# Patient Record
Sex: Female | Born: 1950 | Race: White | Hispanic: No | Marital: Married | Smoking: Current every day smoker
Health system: Southern US, Community
[De-identification: ages and names within clinical notes are randomized; demographics above are authoritative.]

## PROBLEM LIST (undated history)

## (undated) DIAGNOSIS — J410 Simple chronic bronchitis: Secondary | ICD-10-CM

## (undated) DIAGNOSIS — F419 Anxiety disorder, unspecified: Secondary | ICD-10-CM

## (undated) DIAGNOSIS — M1990 Unspecified osteoarthritis, unspecified site: Secondary | ICD-10-CM

## (undated) DIAGNOSIS — Z9641 Presence of insulin pump (external) (internal): Secondary | ICD-10-CM

## (undated) DIAGNOSIS — G2581 Restless legs syndrome: Secondary | ICD-10-CM

## (undated) DIAGNOSIS — J45909 Unspecified asthma, uncomplicated: Secondary | ICD-10-CM

## (undated) DIAGNOSIS — M810 Age-related osteoporosis without current pathological fracture: Secondary | ICD-10-CM

## (undated) DIAGNOSIS — Z972 Presence of dental prosthetic device (complete) (partial): Secondary | ICD-10-CM

## (undated) DIAGNOSIS — E119 Type 2 diabetes mellitus without complications: Secondary | ICD-10-CM

## (undated) HISTORY — DX: Anxiety disorder, unspecified: F41.9

## (undated) HISTORY — DX: Age-related osteoporosis without current pathological fracture: M81.0

## (undated) HISTORY — PX: THYROID SURGERY: SHX805

## (undated) HISTORY — PX: ROTATOR CUFF REPAIR: SHX139

## (undated) HISTORY — DX: Type 2 diabetes mellitus without complications: E11.9

---

## 1982-01-12 HISTORY — PX: SPINE SURGERY: SHX786

## 2002-04-03 ENCOUNTER — Ambulatory Visit (HOSPITAL_COMMUNITY): Admission: RE | Admit: 2002-04-03 | Discharge: 2002-04-03 | Payer: Self-pay | Admitting: Neurology

## 2002-04-03 ENCOUNTER — Encounter: Payer: Self-pay | Admitting: Neurology

## 2005-01-12 HISTORY — PX: JOINT REPLACEMENT: SHX530

## 2005-09-07 ENCOUNTER — Other Ambulatory Visit: Payer: Self-pay

## 2005-09-07 ENCOUNTER — Inpatient Hospital Stay: Payer: Self-pay | Admitting: Specialist
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
## 2008-02-03 ENCOUNTER — Emergency Department: Payer: Self-pay | Admitting: Emergency Medicine

## 2008-02-03 IMAGING — CT CT HEAD WITHOUT CONTRAST
2 series · 15 of 30 positions shown, 19 images · non-contrast
Comparison: none

REASON FOR EXAM: headache  head injury
COMMENTS:

PROCEDURE:     CT  - CT HEAD WITHOUT CONTRAST  - [DATE]  [DATE]
RESULT:     Comparison: No comparison
TECHNIQUE: Multiple axial images from the foramen magnum to the vertex were
obtained without IV contrast.

[Series 2: without · axial · non-contrast · 0.42mm/px · z∈[-149,-29]mm · 13 of 30 slices shown, 17 images]
[im 3/30  brain]
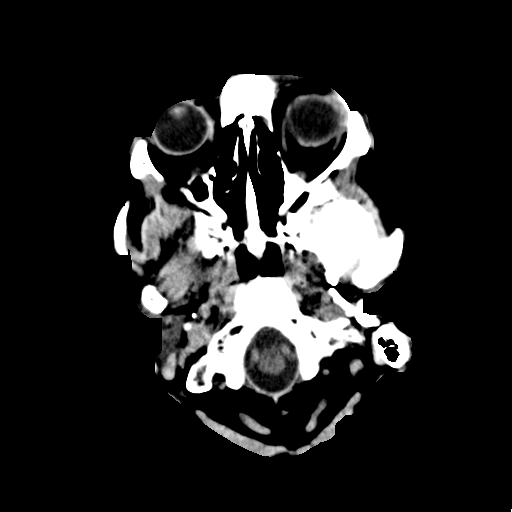
[im 3/30  bone]
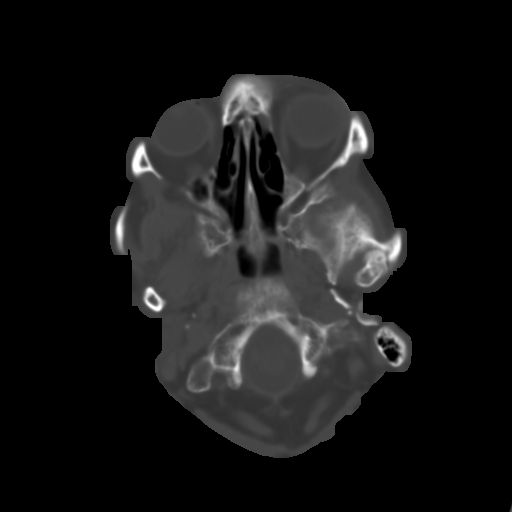
[im 5/30  brain]
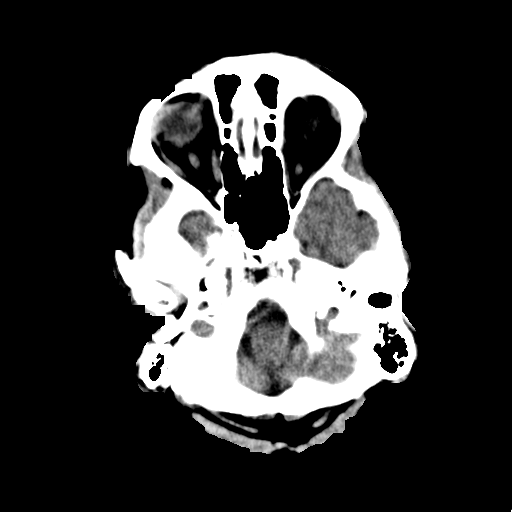
[im 7/30  brain]
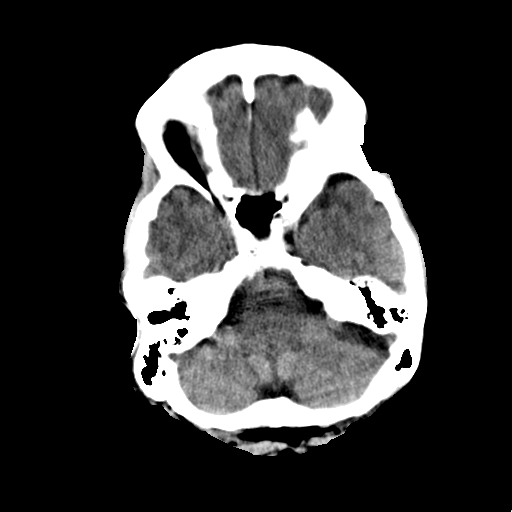
[im 9/30  brain]
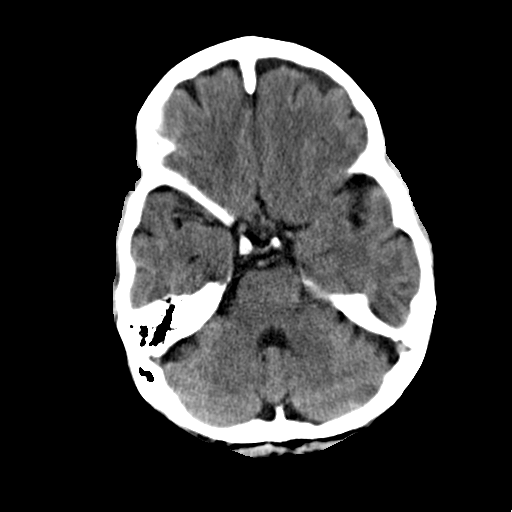
[im 11/30  brain]
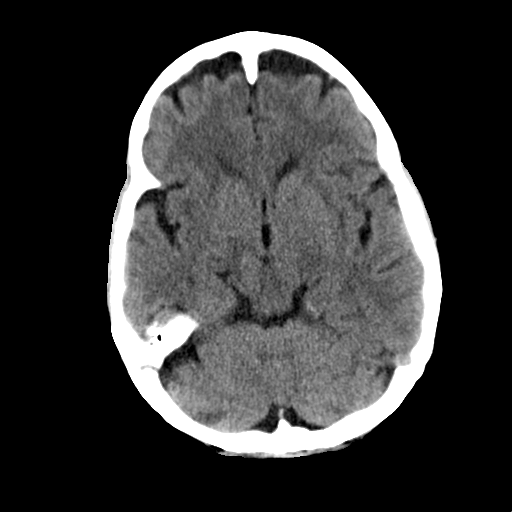
[im 11/30  bone]
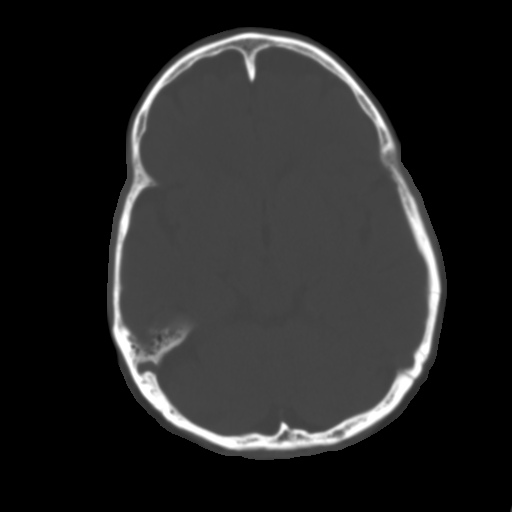
[im 13/30  brain]
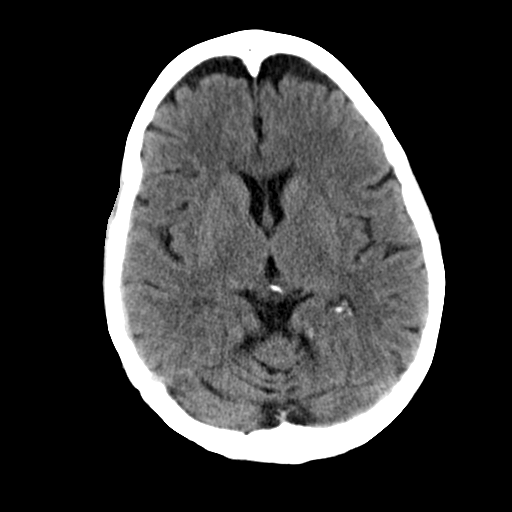
[im 15/30  brain]
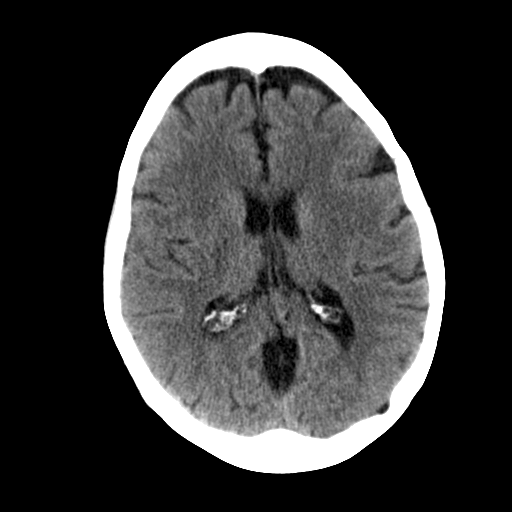
[im 17/30  brain]
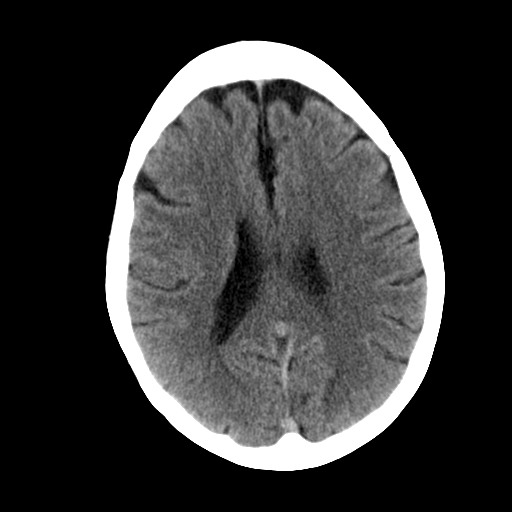
[im 19/30  brain]
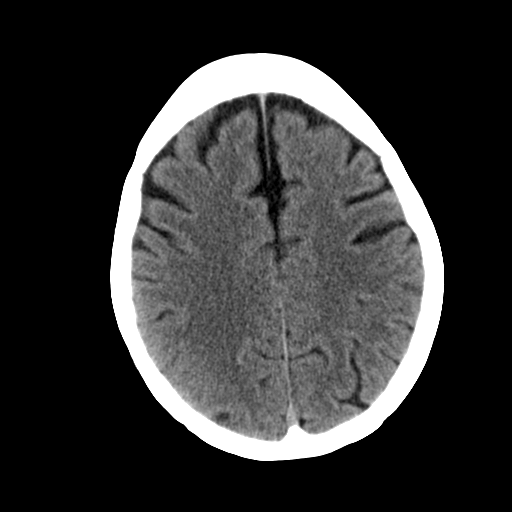
[im 19/30  bone]
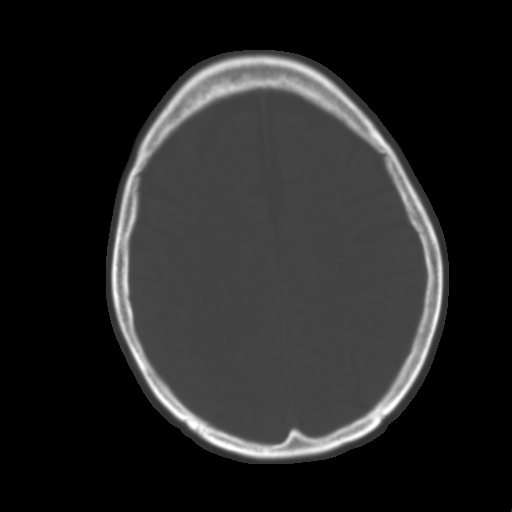
[im 21/30  brain]
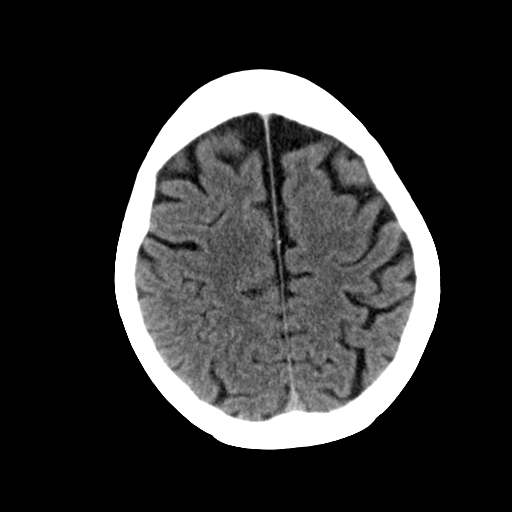
[im 23/30  brain]
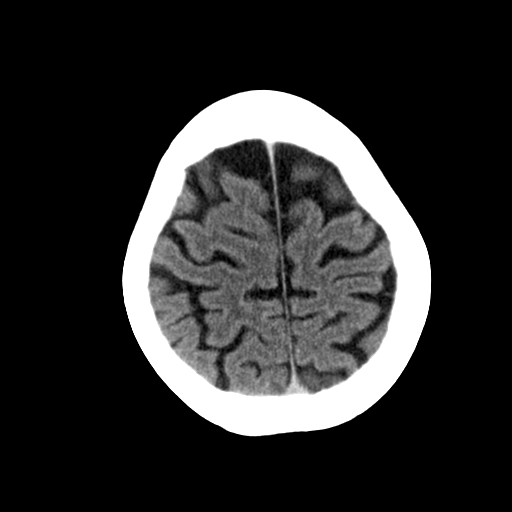
[im 25/30  brain]
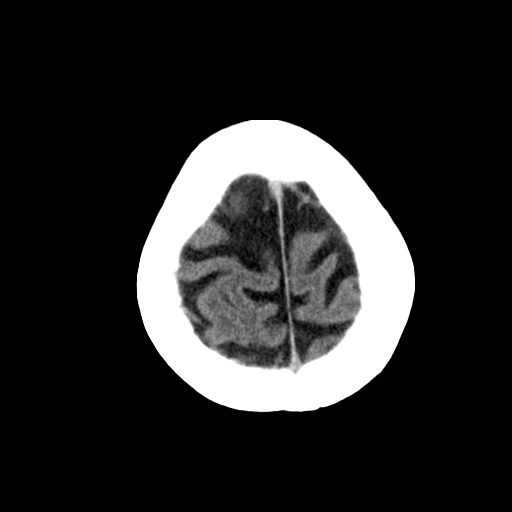
[im 27/30  brain]
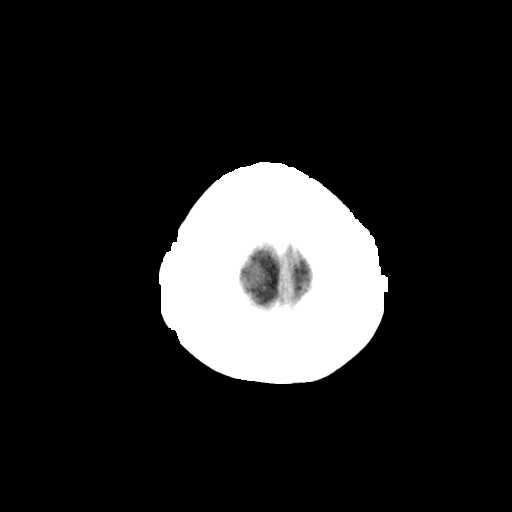
[im 27/30  bone]
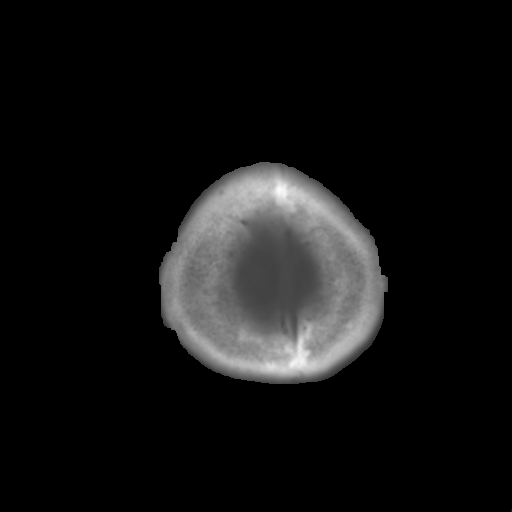

[Series 3: bone · axial · 0.42mm/px · z∈[-149,-129]mm · 2 of 30 slices shown]
[im 3/30  bone]
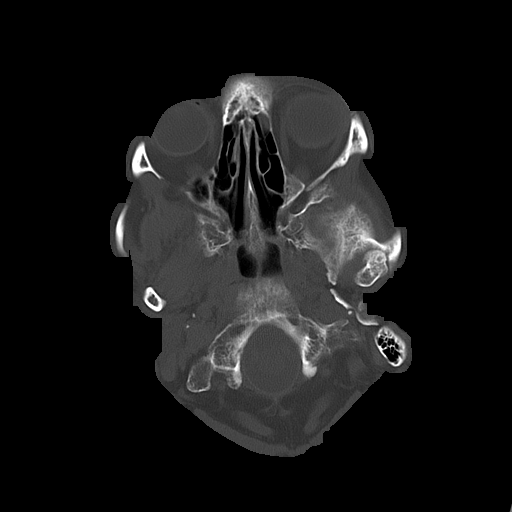
[im 7/30  bone]
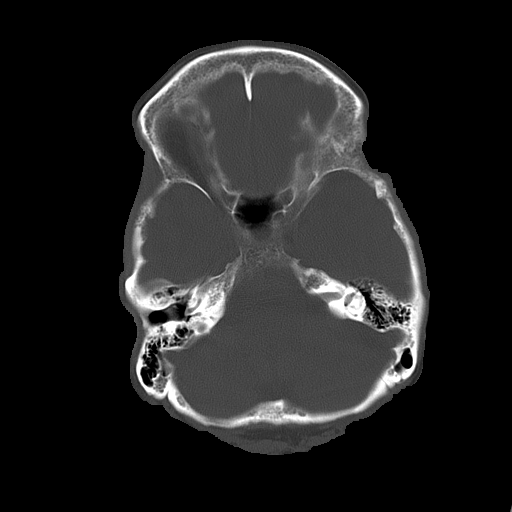

[15 of 30 positions shown; findings below may reference images not displayed]

FINDINGS: There is no evidence for mass effect, midline shift, or extra-axial fluid
collections.  There is generalized cerebral atrophy. There is mild
periventricular and subcortical white matter low attenuation which is
nonspecific and is most commonly seen with microangiopathy. There is no
evidence for space-occupying lesion or intracranial hemorrhage. There is no
evidence for a cortical-based area of acute infarction.

Ventricles and sulci are appropriate for the patient's age. The basal
cisterns are patent.

Visualized portions of the orbits are unremarkable. The paranasal sinuses
and mastoid air cells are unremarkable.

The osseous structures are unremarkable. There is left periorbital soft
tissue swelling.
IMPRESSION: No acute intracranial process.

Left periorbital soft tissue swelling.
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
## 2009-11-01 ENCOUNTER — Emergency Department: Payer: Self-pay | Admitting: Emergency Medicine

## 2010-02-21 ENCOUNTER — Inpatient Hospital Stay: Payer: Self-pay | Admitting: Internal Medicine

## 2010-02-21 IMAGING — CR DG CHEST 2V
1 series · 2 of 2 positions shown · non-contrast
Comparison: none

REASON FOR EXAM: Shortness of Breath
COMMENTS:   May transport without cardiac monitor

PROCEDURE:     DXR - DXR CHEST PA (OR AP) AND LATERAL  - [DATE]  [DATE]
RESULT:     Comparison: None

[Series 1: view not recorded · 0.17mm/px · 2 of 2 slices shown]
[im 1/2]
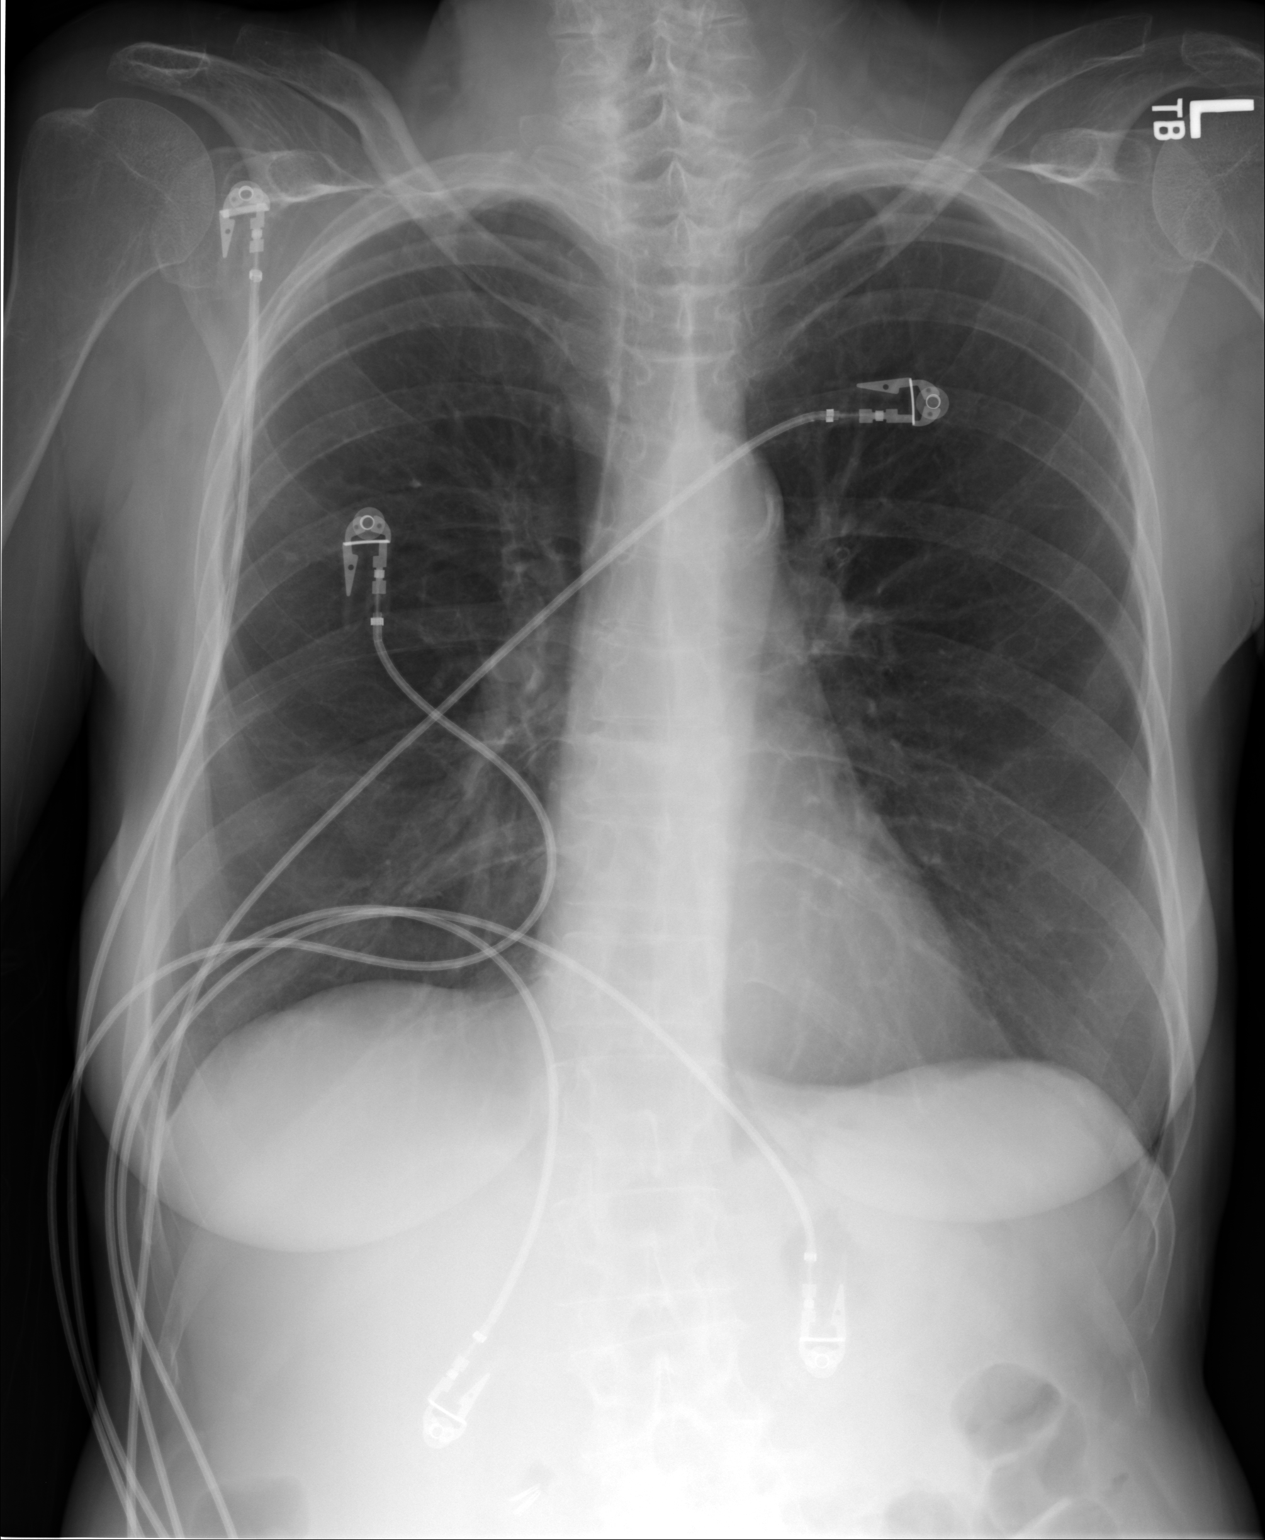
[im 2/2]
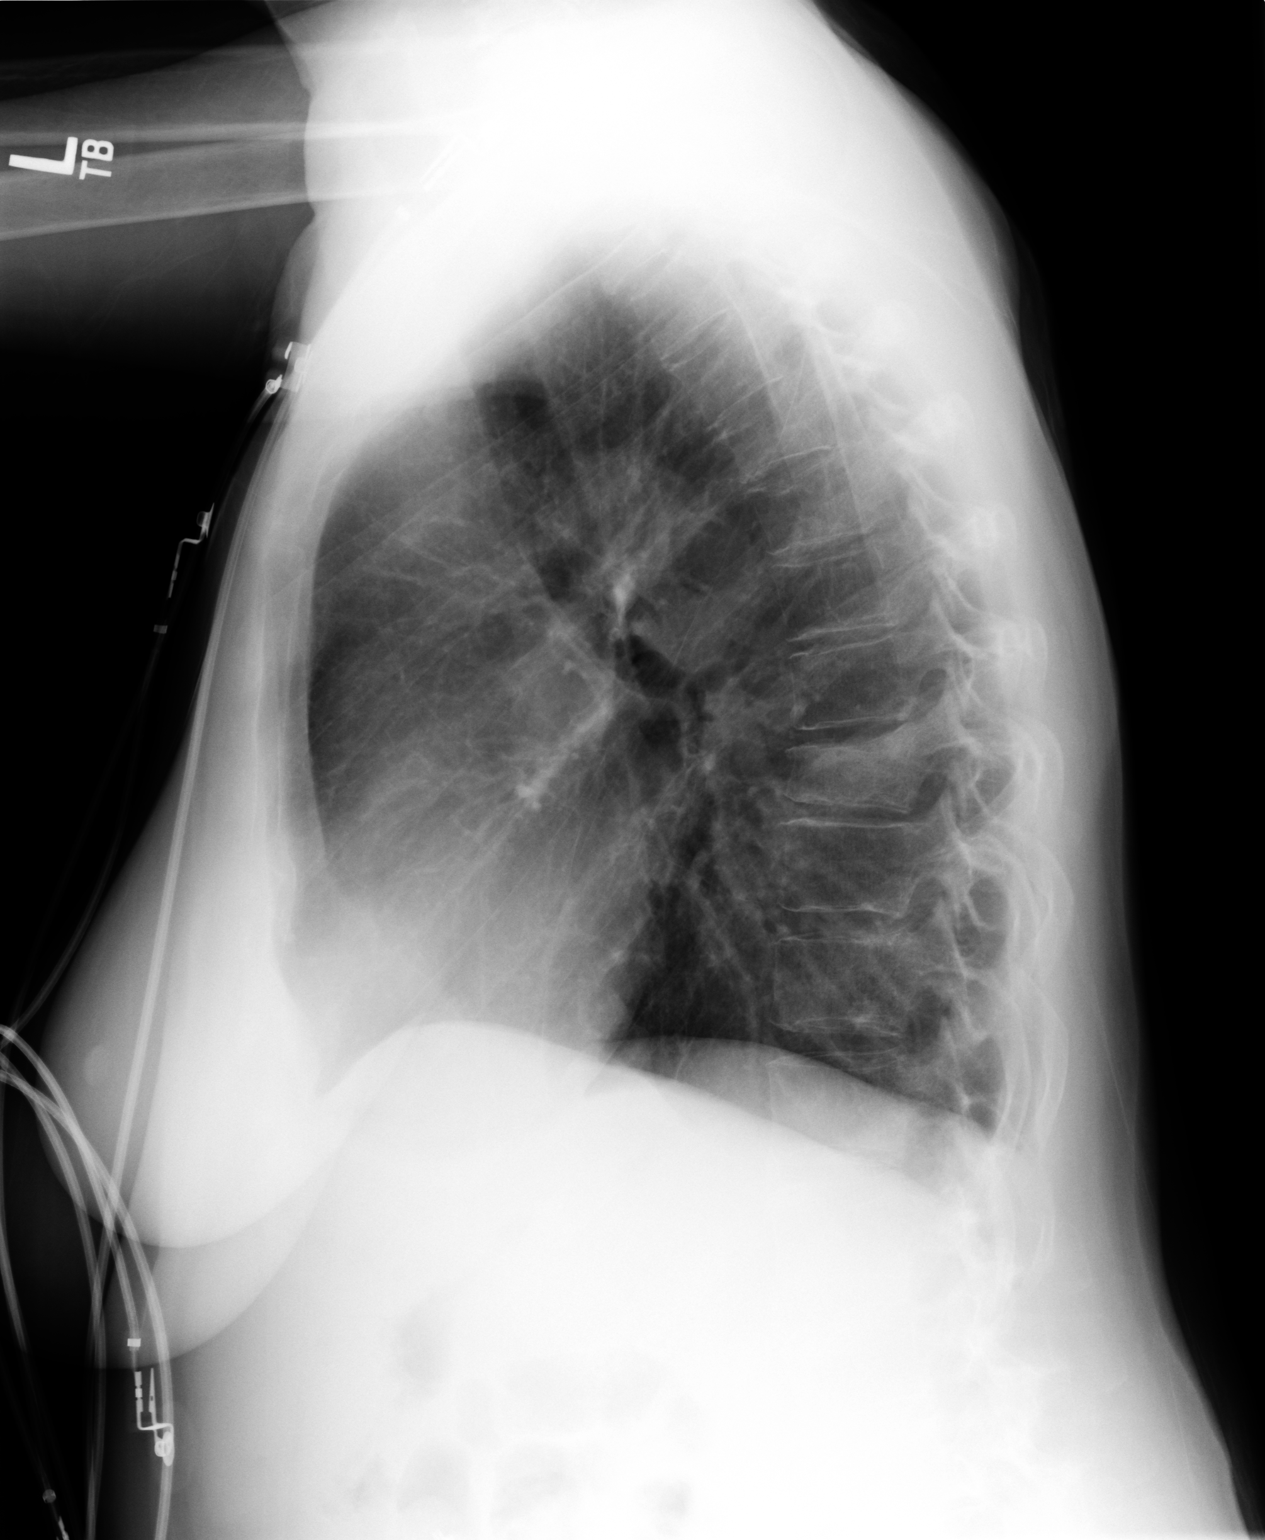

[2 of 2 positions shown; findings below may reference images not displayed]

FINDINGS: PA and lateral chest radiographs are provided. There is a 3 mm nodular
density projecting over the right upper midlung. There is no focal
parenchymal opacity, pleural effusion, or pneumothorax. The heart and
mediastinum are unremarkable.  The osseous structures are unremarkable.
IMPRESSION: No acute disease of the chest.

Small nodular density measuring approximately 3 mm projecting over the right
upper midlung. Recommend followup chest x-ray in 3 months to document
stability.
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
## 2010-11-25 ENCOUNTER — Inpatient Hospital Stay: Payer: Self-pay | Admitting: Psychiatry

## 2012-12-15 ENCOUNTER — Encounter: Payer: Self-pay | Admitting: Orthopedic Surgery

## 2013-01-12 ENCOUNTER — Encounter: Payer: Self-pay | Admitting: Orthopedic Surgery

## 2013-02-12 ENCOUNTER — Encounter: Payer: Self-pay | Admitting: Orthopedic Surgery

## 2013-09-21 ENCOUNTER — Encounter: Payer: Self-pay | Admitting: Orthopedic Surgery

## 2013-10-12 ENCOUNTER — Encounter: Payer: Self-pay | Admitting: Orthopedic Surgery

## 2013-11-12 ENCOUNTER — Encounter: Payer: Self-pay | Admitting: Orthopedic Surgery

## 2013-12-12 ENCOUNTER — Encounter: Payer: Self-pay | Admitting: Orthopedic Surgery

## 2014-06-12 ENCOUNTER — Encounter: Payer: Self-pay | Admitting: Physical Therapy

## 2014-06-12 ENCOUNTER — Ambulatory Visit: Attending: Orthopedic Surgery | Admitting: Physical Therapy

## 2014-06-12 DIAGNOSIS — M6281 Muscle weakness (generalized): Secondary | ICD-10-CM

## 2014-06-12 DIAGNOSIS — G8929 Other chronic pain: Secondary | ICD-10-CM

## 2014-06-12 DIAGNOSIS — M25512 Pain in left shoulder: Secondary | ICD-10-CM | POA: Insufficient documentation

## 2014-06-12 NOTE — Patient Instructions (Signed)
Home exercises: Scapular adduction, isometric IR/ER and extension in doorway

## 2014-06-12 NOTE — Therapy (Signed)
[facility] [address] [place], [place], [address] Phone: [phone]   Fax:  [phone]  Physical Therapy Evaluation  Patient Details  Name: Suzanne Snyder MRN: [id number] Date of Birth: 04-Jul-1950 Referring Provider:  Carin PrimroseKamath, Ganesh, MD  Encounter Date: 06/12/2014      PT End of Session - 06/12/14 1200    Visit Number 1   Number of Visits 24   Date for PT Re-Evaluation 09/04/14   PT Start Time 1113   PT Stop Time 1200   PT Time Calculation (min) 47 min   Activity Tolerance Patient tolerated treatment well   Behavior During Therapy [facility] for tasks assessed/performed      Past Medical History  Diagnosis Date  . Anxiety   . Diabetes mellitus without complication   . Osteoporosis     Past Surgical History  Procedure Laterality Date  . 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 Spine surgery N/A 1984    L4-5, not sure of procedure  . Rotator cuff repair Left 2014 and 2015    There were no vitals filed for this visit.  Visit Diagnosis:  Chronic shoulder pain, left - Plan: PT plan of care cert/re-cert  Muscle weakness of left upper extremity - Plan: PT plan of care cert/re-cert      Subjective Assessment - 06/12/14 1125    Subjective Patient reports she is having pain in left shoulder with weakness in left UE that limits her ability to perform daily activties for personal care and household chores without difficulty.    Pertinent History Patient reports history of pain in left soulder prior to 2014 with subsequent surgery, physical therapy and re repair in 2015. She has never fully recovered from the surgeries and is now hoping to avoid another surgery. She has not had an MRI recently. She has had aquatic therapy in the past with good results and would like to integrate this into her physical therapy treatment this time as well.    Limitations House hold activities;Other (comment)  reaching, carrying  objects, overhead activities   Diagnostic tests none   Patient Stated Goals Patient would like to be able to lift, reach with left UE for improved function with daily activities with less pain    Currently in Pain? Yes   Pain Score 5    Pain Location Shoulder   Pain Orientation Left   Pain Descriptors / Indicators Aching   Pain Type Chronic pain   Pain Onset More than a month ago   Pain Frequency Constant   Aggravating Factors  reaching, lifting, moving left shoulder   Pain Relieving Factors nothing   Effect of Pain on Daily Activities limited in all activties for personal care and household   Multiple Pain Sites No            [facility] - 06/12/14 2029    Assessment   Medical Diagnosis Left shoulder pain   Onset Date/Surgical Date 10/12/12   Hand Dominance Right   Next MD Visit unknown   Prior Therapy 2014 and 2015 following RTC surgeries   Precautions   Precautions None   Balance Screen   Has the patient fallen in the past 6 months No   Has the patient had a decrease in activity level because of a fear of falling?  No   Is the patient reluctant to leave their home because of a fear of falling?  No  Prior Function   Level of Independence Independent   Observation/Other Assessments   Quick DASH  42%, work module (homemaker): 50% impaired, Recreation (swimming, gardening: 50% impaired    Posture/Postural Control   Posture Comments sitting/standing: left shoulder more forward than right, + winging of left scpaula, + hiking left shoulder   ROM / Strength   AROM / PROM / Strength AROM;Strength: limited ER, flexion, abduction, extension and scapular stabilizers left UE   AROM   AROM Assessment Site Shoulder   Right/Left Shoulder Left   Left Shoulder Extension 45 Degrees   Left Shoulder Flexion 125 Degrees   Left Shoulder ABduction 100 Degrees   Left Shoulder Internal Rotation 90 Degrees   Left Shoulder External Rotation 90 Degrees   Left Shoulder Horizontal  ABduction --  NT   Left Shoulder Horizontal ADduction --  NT   Strength   Overall Strength Comments left shoulder decreased strength scapular adduction, lower trapezius, shoulder extension, flexion, ER and abduction            PT Education - 06/12/14 1145    Education provided Yes   Education Details proper posture to avoid increased pressure on front of shoulder, proper scapula position rotated down and back for controlled motion of shoulder with exercises   Person(s) Educated Patient   Methods Explanation;Demonstration;Tactile cues;Verbal cues   Comprehension Verbalized understanding;Returned demonstration;Verbal cues required;Tactile cues required;Need further instruction             PT Long Term Goals - 06/12/14 2052    PT LONG TERM GOAL #1   Title Patient will improve AROM to 130 degrees left shoulder with mild discomfort for reaching into cabinet or performing hair care by 07/13/2014   Status New   PT LONG TERM GOAL #2   Title Patient will report pain level decrease from 8/10 to 5/10 with aggravating activities involving left UE/shoulder by 07/13/2014   Baseline 8/10 worst reported pain level   Status New   PT LONG TERM GOAL #3   Title Patient will improve Quick Dash score to 30% or less by 07/27/2014 indicating improved functional use with left UE with less difficulty and pain   Baseline initially 42% to 50% impairment   Status New   PT LONG TERM GOAL #4   Title Patient will improve strength in left UE shoulder stabilizers with noted improved posture and decreased winging of scapula and improved overhead use of left UE by 08/17/2014   Status New   PT LONG TERM GOAL #5   Title Patient will improve quickDash score to 20% or less by 09/04/2014 indicating improved functional use with decreased pain in left shoulder   Baseline initial 42% to 50% impairment   Status New   Additional Long Term Goals   Additional Long Term Goals Yes   PT LONG TERM GOAL #6   Title Patient  will  be independent with home program for strength and pain control for left UE/shoulder by 09/04/2014   Baseline limited knowledge of pain control strategies or progression of exercises for left UE   Status New               Plan - 06/12/14 2048    Clinical Impression Statement Patient presents with weakness and pain in left shoulder following multiple rotator cuff surgeries within the past 2 years. She is limited in most daily activities due to pain and weakness and will benefit from skilled physical therapy intervention to address limitations in order to strengthen her  left UE and control her pain.    Rehab Potential Fair   Clinical Impairments Affecting Rehab Potential multiple surgeries left shoulder for rotator cuff tears without complete return to full function following either surgery   PT Frequency 2x / week   PT Duration 12 weeks   PT Treatment/Interventions Aquatic Therapy;Cryotherapy;Electrical Stimulation;Moist Heat;Therapeutic exercise;Manual techniques, patient education   PT Next Visit Plan begin aquatic therapy for pain control, strengthening    PT Home Exercise Plan per report   Consulted and Agree with Plan of Care Patient         Problem List There are no active problems to display for this patient.   Beacher May PT 06/12/2014, 9:11 PM  [facility] SPORTS [facility]ICINE [address], [place], [address] Phone: [phone]   Fax:  [phone]

## 2014-06-14 ENCOUNTER — Ambulatory Visit: Attending: Orthopedic Surgery | Admitting: Physical Therapy

## 2014-06-14 DIAGNOSIS — G8929 Other chronic pain: Secondary | ICD-10-CM | POA: Insufficient documentation

## 2014-06-14 DIAGNOSIS — M25512 Pain in left shoulder: Secondary | ICD-10-CM | POA: Insufficient documentation

## 2014-06-14 DIAGNOSIS — M6281 Muscle weakness (generalized): Secondary | ICD-10-CM | POA: Diagnosis present

## 2014-06-14 NOTE — Therapy (Signed)
[facility] [address] [place], [place], [address] Phone: [phone]   Fax:  [phone]  Physical Therapy Treatment  Patient Details  Name: Suzanne LeberDeborah L Theilen MRN: [id number] Date of Birth: 17-Feb-1950 Referring Provider:  Carin PrimroseKamath, Ganesh, MD  Encounter Date: 06/14/2014      PT End of Session - 06/14/14 1045    Visit Number 2   Number of Visits 24   Date for PT Re-Evaluation 09/04/14   PT Start Time 1000   PT Stop Time 1035   PT Time Calculation (min) 35 min   Activity Tolerance Patient tolerated treatment well;No increased pain   Behavior During Therapy [facility] tasks assessed/performed;Impulsive      Past Medical History  Diagnosis Date  . Anxiety   . Diabetes mellitus without complication   . Osteoporosis     Past Surgical History  Procedure Laterality Date  . 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 Spine surgery N/A 1984    L4-5, not sure of procedure  . Rotator cuff repair Left 2014 and 2015    There were no vitals filed for this visit.  Visit Diagnosis:  Chronic shoulder pain, left  Muscle weakness of left upper extremity      Subjective Assessment - 06/14/14 1039    Subjective Patient reports she is having pain in left shoulder with weakness in left UE that limits her ability to perform daily activties for personal care and household chores without difficulty.    Limitations House hold activities;Other (comment)  Lifting, overhead activities   Patient Stated Goals Patient would like to be able to lift, reach with left UE for improved function with daily activities with less pain    Pain Location Shoulder   Pain Orientation Left   Pain Descriptors / Indicators Aching   Pain Type Chronic pain   Pain Onset More than a month ago   Pain Frequency Intermittent   Multiple Pain Sites No                     Adult Aquatic Therapy - 06/14/14 1040    Aquatic Therapy Subjective    Subjective Patient reports difficulty lifint arm to put on deodorant, and difficulty performing household activities.    Treatment   Specific Exercises Shoulder   Shoulder Patient performed large shoulder circles, shoulder retraction, shoulder extension, shoulder adduction, elbow flexion/extension, IR/ER, shoulder rolls in each direction, reaching out and around, reaching out and down. All activities performed bilaterally and for approximately 2 min each. Each activity performed in 4'6" of water.                     PT Education - 06/14/14 1044    Education provided Yes   Education Details proper form with exercises.   Person(s) Educated Patient   Methods Explanation;Demonstration;Verbal cues   Comprehension Verbalized understanding;Returned demonstration;Verbal cues required             PT Long Term Goals - 06/12/14 2052    PT LONG TERM GOAL #1   Title Patient will improve AROM to 130 degrees left shoulder with mild discomfort for reaching into cabilint or performing hair care by 07/13/2014   Status New   PT LONG TERM GOAL #2   Title Patient will report pain level decrease from 8/10 to 5/10 with aggravating activities involving left UE/shoulder by 07/13/2014   Baseline 8/10 worst reported pain level  Status New   PT LONG TERM GOAL #3   Title Patient will improve Quick Dash score to 30% or less by 07/27/2014 indicating improved functional use with left UE with less difficulty and pain   Baseline initially 42% to 50% impairment   Status New   PT LONG TERM GOAL #4   Title Patient will improve strength in left UE shoulder stabilizers with noted improved posture and decreased winging of scapula and imporved overhead use of left UE by 08/17/2014   Status New   PT LONG TERM GOAL #5   Title Patient will improve quickDash score to 20% or less by 09/04/2014 indicating improved functional use with decreased pain in left shoulder   Baseline initial 42% to 50% impairment    Status New   Additional Long Term Goals   Additional Long Term Goals Yes   PT LONG TERM GOAL #6   Title Patient will  be independent with home program for strength and pain control for left UE/shoulder by 09/04/2014   Baseline limited knowledge of pain control strategies or progression of exercises for left UE   Status New               Plan - 06/14/14 1046    Clinical Impression Statement Patient has some reported pain with certain activities in pool.    Pt will benefit from skilled therapeutic intervention in order to improve on the following deficits Decreased activity tolerance;Decreased range of motion;Decreased strength   Rehab Potential Fair   PT Frequency 2x / week   PT Duration 12 weeks   PT Treatment/Interventions Aquatic Therapy;Cryotherapy;Electrical Stimulation;Moist Heat;Therapeutic exercise;Manual techniques   Consulted and Agree with Plan of Care Patient        Problem List There are no active problems to display for this patient.   Lyal Husted, PT  06/14/2014, 10:48 AM  [facility] PHYSICAL AND SPORTS MEDICINE [address] [place], [address] Phone: [phone]   Fax:  [phone]

## 2014-06-19 ENCOUNTER — Encounter: Payer: Self-pay | Admitting: Physical Therapy

## 2014-06-19 ENCOUNTER — Ambulatory Visit: Admitting: Physical Therapy

## 2014-06-19 DIAGNOSIS — M25512 Pain in left shoulder: Secondary | ICD-10-CM | POA: Diagnosis not present

## 2014-06-19 NOTE — Therapy (Signed)
[facility] PHYSICAL AND [facility] MEDICINE [address] [facility], [place], [address] Phone: [phone]   Fax:  [phone]  Physical Therapy Treatment  Patient Details  Name: Suzanne LeberDeborah L Lewallen MRN: [id number] Date of Birth: 04/26/1950 Referring Provider:  Carin PrimroseKamath, Ganesh, MD  Encounter Date: 06/19/2014      PT End of Session - 06/19/14 1307    Visit Number 3   Number of Visits 24   Date for PT Re-Evaluation 09/04/14   PT Start Time 1045   PT Stop Time 1130   PT Time Calculation (min) 45 min   Activity Tolerance Patient tolerated treatment well;No increased pain   Behavior During Therapy [facility] for tasks assessed/performed;Impulsive      Past Medical History  Diagnosis Date  . Anxiety   . Diabetes mellitus without complication   . Osteoporosis     Past Surgical History  Procedure Laterality Date  . 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 Spine surgery N/A 1984    L4-5, not sure of procedure  . Rotator cuff repair Left 2014 and 2015    There were no vitals filed for this visit.  Visit Diagnosis:  Chronic shoulder pain, left  Muscle weakness of left upper extremity      Subjective Assessment - 06/19/14 1305    Subjective Patient reports she continues to have trouble raising her left arm and feels it is weaker anteriorly than her RUE.    Pertinent History Patient reports history of pain in left soulder prior to 2014 with subsequent surgery, physical therapy and re repair in 2015. She has never fully recovered from the surgeries and is now hoping to avoid another surgery. She has not had an MRI recently.    Limitations House hold activities;Other (comment)   Diagnostic tests none   Patient Stated Goals Patient would like to be able to lift, reach with left UE for improved function with daily activities with less pain    Currently in Pain? --  Patient does not comment on any pain, and her body language does not suggest any pain       Aquatic Therapy  Forward walking x 2 laps encouraged to increase arm swing  Wall push-up plusses x 1 minute  Standing ER bilaterally of the shoulders x 2 minutes, for 2 sets  Standing pull drills with noodle x 1.5 minutes x 3 sets  Standing push drills with noodle x 1 minute for 2 sets  Standing horizontal abductions with thumbs up x 1 minute  Standing arm extensions x 1 minute for 2 sets  Scapular retractions x2 minutes for 2 sets  Standing elbow extension/flexion with cuing for elbow on ribcage x 1 minute for 2 sets  2 laps underwater swimming, patient requested and stated she is normally able to do this.   Patient educated extensively to retract shoulder blades to initiate rowing activities and not shrug. Patient educated not to let scapula tip anteriorly, and instead get into "horse riding" position.                            PT Education - 06/19/14 1306    Education provided Yes   Education Details Eductaed to not shrug her shoulder with exercises or have her elbow come past her rib cage.    Person(s) Educated Patient   Methods Explanation;Demonstration;Verbal cues   Comprehension Verbalized understanding;Returned demonstration;Verbal cues required  PT Long Term Goals - 06/19/14 1308    PT LONG TERM GOAL #1   Title Patient will improve AROM to 130 degrees left shoulder with mild discomfort for reaching into cabilint or performing hair care by 07/13/2014   Status New   PT LONG TERM GOAL #2   Title Patient will report pain level decrease from 8/10 to 5/10 with aggravating activities involving left UE/shoulder by 07/13/2014   Baseline 8/10 worst reported pain level   Status New   PT LONG TERM GOAL #3   Title Patient will improve Quick Dash score to 30% or less by 07/27/2014 indicating improved functional use with left UE with less difficulty and pain   Baseline initially 42% to 50% impairment   Status New   PT LONG TERM GOAL #4   Title  Patient will improve strength in left UE shoulder stabilizers with noted improved posture and decreased winging of scapula and imporved overhead use of left UE by 08/17/2014   Status New   PT LONG TERM GOAL #5   Title Patient will improve quickDash score to 20% or less by 09/04/2014 indicating improved functional use with decreased pain in left shoulder   Baseline initial 42% to 50% impairment   Status New   PT LONG TERM GOAL #6   Title Patient will  be independent with home program for strength and pain control for left UE/shoulder by 09/04/2014   Baseline limited knowledge of pain control strategies or progression of exercises for left UE   Status New               Plan - 06/19/14 1309    Clinical Impression Statement Patient provided extensive education on exercise technique as she performed anterior tipping of her scapula and increased upper trapezius activation on her LUE. She was able to correct her technique with cuing and did not complain of any discomfort throughout the session.    Pt will benefit from skilled therapeutic intervention in order to improve on the following deficits Decreased activity tolerance;Decreased range of motion;Decreased strength   Rehab Potential Fair   Clinical Impairments Affecting Rehab Potential multiple surgeries left shoulder for rotator cuff tears without complete return to full function following either surgery   PT Frequency 2x / week   PT Duration 12 weeks   PT Treatment/Interventions Aquatic Therapy;Cryotherapy;Electrical Stimulation;Moist Heat;Therapeutic exercise;Manual techniques   PT Next Visit Plan Motor control/movement patterning, increase her peri-scapular musculature.   PT Home Exercise Plan Continued previous    Consulted and Agree with Plan of Care Patient        Problem List There are no active problems to display for this patient.  Kerin Ransom, PT, DPT   06/19/2014, 1:11 PM   [facility] PHYSICAL AND SPORTS MEDICINE [address] [place], [address] Phone: [phone]   Fax:  [phone]

## 2014-06-21 ENCOUNTER — Ambulatory Visit: Admitting: Physical Therapy

## 2014-06-21 DIAGNOSIS — M25512 Pain in left shoulder: Secondary | ICD-10-CM | POA: Diagnosis not present

## 2014-06-21 DIAGNOSIS — G8929 Other chronic pain: Secondary | ICD-10-CM

## 2014-06-21 DIAGNOSIS — M6281 Muscle weakness (generalized): Secondary | ICD-10-CM

## 2014-06-21 NOTE — Therapy (Signed)
[facility] [address] [place], [address] Phone: [phone]   Fax:  [phone]  Physical Therapy Treatment  Patient Details  Name: Suzanne Snyder MRN: [id number] Date of Birth: 03-Jan-1951 Referring Provider:  Carin Primrose, MD  Encounter Date: 06/21/2014      PT End of Session - 06/21/14 1323    Visit Number 4   Number of Visits 24   Date for PT Re-Evaluation 09/04/14   PT Start Time 1040   PT Stop Time 1130   PT Time Calculation (min) 50 min   Activity Tolerance Patient tolerated treatment well;No increased pain   Behavior During Therapy [facility] for tasks assessed/performed;Impulsive;Restless      Past Medical History  Diagnosis Date  . Anxiety   . Diabetes mellitus without complication   . Osteoporosis     Past Surgical History  Procedure Laterality Date  . 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 Spine surgery N/A 1984    L4-5, not sure of procedure  . Rotator cuff repair Left 2014 and 2015    There were no vitals filed for this visit.  Visit Diagnosis:  Chronic shoulder pain, left  Muscle weakness of left upper extremity      Subjective Assessment - 06/21/14 1318    Subjective Patient reports she has been feeling good since last session. Some reports of soreness throughout therapy, but nothing severe.   Pertinent History Patient reports history of pain in left soulder prior to 2014 with subsequent surgery, physical therapy and re repair in 2015. She has never fully recovered from the surgeries and is now hoping to avoid another surgery. She has not had an MRI recently.    Limitations House hold activities   Patient Stated Goals Patient would like to be able to lift, reach with left UE for improved function with daily activities with less pain    Currently in Pain? No/denies   Pain Location Shoulder   Pain Orientation Left   Pain Descriptors / Indicators Aching   Pain Type  Chronic pain   Pain Onset More than a month ago   Pain Frequency Intermittent   Multiple Pain Sites No                     Adult Aquatic Therapy - 06/21/14 1320    Aquatic Therapy Subjective   Subjective Patient reports difficulty lifting arm to put on deodorant, and difficulty performing household activities.    Treatment   Specific Exercises Shoulder   Shoulder Patient performed large shoulder circles, shoulder retraction, shoulder extension, shoulder adduction, elbow flexion/extension, IR/ER, shoulder rolls in each direction, reaching out and around, reaching out and down. Using kickboard for push/pull.  All activities performed bilaterally and for approximately 2 min each. Each activity performed in 4'6" of water.                     PT Education - 06/21/14 1322    Education provided Yes   Education Details Cues for proper form with exercises, to go only to painful range.    Person(s) Educated Patient   Methods Explanation;Verbal cues;Demonstration   Comprehension Verbalized understanding;Returned demonstration;Verbal cues required             PT Long Term Goals - 06/19/14 1308    PT LONG TERM GOAL #1   Title Patient will improve AROM to 130 degrees left shoulder with mild  discomfort for reaching into cabilint or performing hair care by 07/13/2014   Status New   PT LONG TERM GOAL #2   Title Patient will report pain level decrease from 8/10 to 5/10 with aggravating activities involving left UE/shoulder by 07/13/2014   Baseline 8/10 worst reported pain level   Status New   PT LONG TERM GOAL #3   Title Patient will improve Quick Dash score to 30% or less by 07/27/2014 indicating improved functional use with left UE with less difficulty and pain   Baseline initially 42% to 50% impairment   Status New   PT LONG TERM GOAL #4   Title Patient will improve strength in left UE shoulder stabilizers with noted improved posture and decreased winging of scapula  and imporved overhead use of left UE by 08/17/2014   Status New   PT LONG TERM GOAL #5   Title Patient will improve quickDash score to 20% or less by 09/04/2014 indicating improved functional use with decreased pain in left shoulder   Baseline initial 42% to 50% impairment   Status New   PT LONG TERM GOAL #6   Title Patient will  be independent with home program for strength and pain control for left UE/shoulder by 09/04/2014   Baseline limited knowledge of pain control strategies or progression of exercises for left UE   Status New               Plan - 06/21/14 1324    Clinical Impression Statement Patient reports that most of her pain is with overhead activities or lifing arm up. We are working on strengthening at shoulder level or below to avoid painful range and improve shoulder stabilization.   Pt will benefit from skilled therapeutic intervention in order to improve on the following deficits Pain   Rehab Potential Fair   PT Frequency 2x / week   PT Duration 12 weeks   PT Treatment/Interventions Aquatic Therapy   PT Next Visit Plan Motor control/movement patterning, increase her peri-scapular musculature.   Consulted and Agree with Plan of Care Patient        Problem List There are no active problems to display for this patient.   Danyetta Gillham, PT 06/21/2014, 1:27 PM  [facility] PHYSICAL AND SPORTS MEDICINE [address], [place], [address] Phone: [phone]   Fax:  [phone]

## 2014-06-26 ENCOUNTER — Ambulatory Visit: Admitting: Physical Therapy

## 2014-06-26 DIAGNOSIS — M25512 Pain in left shoulder: Secondary | ICD-10-CM | POA: Diagnosis not present

## 2014-06-26 DIAGNOSIS — G8929 Other chronic pain: Secondary | ICD-10-CM

## 2014-06-26 DIAGNOSIS — M6281 Muscle weakness (generalized): Secondary | ICD-10-CM

## 2014-06-26 NOTE — Therapy (Signed)
[facility] PHYSICAL [facility]ORTS MEDICINE [address], [place], [address] Phone: [phone]   Fax:  [phone]  Physical Therapy Treatment  Patient Details  Name: Suzanne Snyder MRN: [id number] Date of Birth: 09-19-1950 Referring Provider:  Carin Primrose, MD  Encounter Date: 06/26/2014      PT End of Session - 06/26/14 1135    Visit Number 5   Number of Visits 24   Date for PT Re-Evaluation 09/04/14   PT Start Time 1040   PT Stop Time 1123   PT Time Calculation (min) 43 min   Activity Tolerance Patient tolerated treatment well;Patient limited by pain   Behavior During Therapy [facility] for tasks assessed/performed      Past Medical History  Diagnosis Date  . Anxiety   . Diabetes mellitus without complication   . Osteoporosis     Past Surgical History  Procedure Laterality Date  . 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 Spine surgery N/A 1984    L4-5, not sure of procedure  . Rotator cuff repair Left 2014 and 2015    There were no vitals filed for this visit.  Visit Diagnosis:  Chronic shoulder pain, left  Muscle weakness of left upper extremity      Subjective Assessment - 06/26/14 1131    Subjective Patient states she has low energy today due to not being able to sleep and is not really feeling well. She almost called and cancelled.   Pertinent History Patient reports history of pain in left soulder prior to 2014 with subsequent surgery, physical therapy and re repair in 2015. She has never fully recovered from the surgeries and is now hoping to avoid another surgery. She has not had an MRI recently.    Limitations House hold activities   Patient Stated Goals Patient would like to be able to lift, reach with left UE for improved function with daily activities with less pain    Currently in Pain? Yes   Pain Location Shoulder   Pain Orientation Left   Pain Descriptors / Indicators Aching;Sore   Pain Type Chronic  pain   Pain Onset More than a month ago   Pain Frequency Intermittent   Multiple Pain Sites No                     Adult Aquatic Therapy - 06/26/14 1133    Aquatic Therapy Subjective   Subjective Patient states she did not sleep well, has low energy and has not been feeling well. Wanted to call and cancel but decided to come.    Treatment   Specific Exercises Shoulder   Shoulder Patient performed large shoulder circles, shoulder retraction, shoulder extension, shoulder adduction, elbow flexion/extension, IR/ER, shoulder rolls in each direction, reaching out and around, reaching out and down. Using kickboard for push/pull.  All activities performed bilaterally and for approximately 2 min each. Each activity performed in 4'6" of water.                     PT Education - 06/26/14 1134    Education provided Yes   Education Details Cues for exercises, proper form   Person(s) Educated Patient   Methods Explanation;Demonstration   Comprehension Returned demonstration;Verbal cues required             PT Long Term Goals - 06/19/14 1308    PT LONG TERM GOAL #1   Title Patient will improve  AROM to 130 degrees left shoulder with mild discomfort for reaching into cabilint or performing hair care by 07/13/2014   Status New   PT LONG TERM GOAL #2   Title Patient will report pain level decrease from 8/10 to 5/10 with aggravating activities involving left UE/shoulder by 07/13/2014   Baseline 8/10 worst reported pain level   Status New   PT LONG TERM GOAL #3   Title Patient will improve Quick Dash score to 30% or less by 07/27/2014 indicating improved functional use with left UE with less difficulty and pain   Baseline initially 42% to 50% impairment   Status New   PT LONG TERM GOAL #4   Title Patient will improve strength in left UE shoulder stabilizers with noted improved posture and decreased winging of scapula and imporved overhead use of left UE by 08/17/2014    Status New   PT LONG TERM GOAL #5   Title Patient will improve quickDash score to 20% or less by 09/04/2014 indicating improved functional use with decreased pain in left shoulder   Baseline initial 42% to 50% impairment   Status New   PT LONG TERM GOAL #6   Title Patient will  be independent with home program for strength and pain control for left UE/shoulder by 09/04/2014   Baseline limited knowledge of pain control strategies or progression of exercises for left UE   Status New               Plan - 06/26/14 1135    Clinical Impression Statement Patient continues to have left shoulder pain and difficulty mostly with overhead activities.    Pt will benefit from skilled therapeutic intervention in order to improve on the following deficits Decreased activity tolerance;Pain   Rehab Potential Fair   PT Frequency 2x / week   PT Duration 12 weeks   PT Treatment/Interventions Aquatic Therapy;Therapeutic exercise   PT Next Visit Plan Motor control/movement patterning, increase her peri-scapular musculature.   Consulted and Agree with Plan of Care Patient        Problem List There are no active problems to display for this patient.   Aracelis Ulrey, PT  06/26/2014, 11:37 AM  [facility] PHYSICAL AND SPORTS MEDICINE [address] [place], [address] Phone: [phone]   Fax:  [phone]

## 2014-06-27 ENCOUNTER — Ambulatory Visit: Admitting: Physical Therapy

## 2014-06-27 ENCOUNTER — Encounter: Payer: Self-pay | Admitting: Physical Therapy

## 2014-06-27 DIAGNOSIS — M25512 Pain in left shoulder: Principal | ICD-10-CM

## 2014-06-27 DIAGNOSIS — M6281 Muscle weakness (generalized): Secondary | ICD-10-CM

## 2014-06-27 DIAGNOSIS — G8929 Other chronic pain: Secondary | ICD-10-CM

## 2014-06-27 NOTE — Therapy (Signed)
[facility] PHYS[facility]ORTS MEDICINE [address], [place], [address] Phone: [phone]   Fax:  [phone]  Physical Therapy Treatment  Patient Details  Name: Suzanne Snyder MRN: [id number] Date of Birth: 10-Nov-1950 Referring Provider:  Carin Primrose, MD  Encounter Date: 06/27/2014      PT End of Session - 06/27/14 0950    Visit Number 6   Number of Visits 24   Date for PT Re-Evaluation 09/04/14   PT Start Time 0916   PT Stop Time 0950   PT Time Calculation (min) 34 min   Activity Tolerance Patient tolerated treatment well;Patient limited by pain   Behavior During Therapy [facility] for tasks assessed/performed      Past Medical History  Diagnosis Date  . Anxiety   . Diabetes mellitus without complication   . Osteoporosis     Past Surgical History  Procedure Laterality Date  . 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 Spine surgery N/A 1984    L4-5, not sure of procedure  . Rotator cuff repair Left 2014 and 2015    There were no vitals filed for this visit.  Visit Diagnosis:  Chronic shoulder pain, left  Muscle weakness of left upper extremity      Subjective Assessment - 06/27/14 0918    Subjective Patient reports she is doing better with water exercises and would like to continue in pool and land to improve strength and control pain. Patient arrived late to therapy and states that her glucose levels were low this morning and she had to eat prior to leaving home. She is tired today. She reports she feels she has improved in left shoulder with less pain and improved strength with exercising in the water and would like to continue with water exercises in addition to land exercises.    Limitations House hold activities   Patient Stated Goals Patient would like to be able to lift, reach with left UE for improved function with daily activities with less pain    Currently in Pain? Yes   Pain Score 2    Pain Location  Shoulder   Pain Orientation Left   Pain Descriptors / Indicators Sore   Pain Type Chronic pain   Pain Onset More than a month ago   Pain Frequency Intermittent   Multiple Pain Sites No      Objective: AROM: left shoulder in sitting: forward elevation 0-100, AAROM 0-140                                     [facility] Adult PT Treatment/Exercise - 06/27/14 0922    Posture/Postural Control   Posture Comments sitting/standing: left shoulder more forward than right, + winging of left scpaula, + hiking left shoulder   Exercises   Exercises Other Exercises   Other Exercises  standing at cable lat pull downs with 10# and verbal and tactile cuing for correct alignment of shoulder/scapula, seated scapular rows with 10# both UEs with controlled movements and verbal and tactile cuing for engaging lower trapezius, single arm row with 5# controlled with verbal cuing, instructed in scapular adduction and shoulder extension to side of thighs/trunk with resistive band all with verbal cuing to decrease hiking of left shoulder      Patient respone to treatment: Patient required verbal cuing and demonstration to perform exercises with correct alignment of scapula and  shoulder, improved performance with repetition          PT Education - 06/27/14 0935    Education provided Yes   Education Details instructed in home program for scapular control exercises with resistive band with verbal cues and demonstration   Person(s) Educated Patient   Methods Explanation;Demonstration;Verbal cues;Tactile cues   Comprehension Verbalized understanding;Returned demonstration;Verbal cues required             PT Long Term Goals - 06/19/14 1308    PT LONG TERM GOAL #1   Title Patient will improve AROM to 130 degrees left shoulder with mild discomfort for reaching into cabilint or performing hair care by 07/13/2014   Status New   PT LONG TERM GOAL #2   Title Patient will report pain level decrease  from 8/10 to 5/10 with aggravating activities involving left UE/shoulder by 07/13/2014   Baseline 8/10 worst reported pain level   Status New   PT LONG TERM GOAL #3   Title Patient will improve Quick Dash score to 30% or less by 07/27/2014 indicating improved functional use with left UE with less difficulty and pain   Baseline initially 42% to 50% impairment   Status New   PT LONG TERM GOAL #4   Title Patient will improve strength in left UE shoulder stabilizers with noted improved posture and decreased winging of scapula and imporved overhead use of left UE by 08/17/2014   Status New   PT LONG TERM GOAL #5   Title Patient will improve quickDash score to 20% or less by 09/04/2014 indicating improved functional use with decreased pain in left shoulder   Baseline initial 42% to 50% impairment   Status New   PT LONG TERM GOAL #6   Title Patient will  be independent with home program for strength and pain control for left UE/shoulder by 09/04/2014   Baseline limited knowledge of pain control strategies or progression of exercises for left UE   Status New               Plan - 06/26/14 1135    Clinical Impression Statement Patient continues to have left shoulder pain and weakness and difficulty mostly with overhead activities. She is benefiting from physical therapy for strengthening in water and on land and will benefit from continued physical therapy 1x/week aquatic and 1-2x/week land exercises for improving functional use of left UE and to transition to independent home program.    Pt will benefit from skilled therapeutic intervention in order to improve on the following deficits Decreased activity tolerance;Pain   Rehab Potential Fair   PT Frequency 2x / week   PT Duration 12 weeks   PT Treatment/Interventions Aquatic Therapy;Therapeutic exercise   PT Next Visit Plan Motor control/movement patterning, increase her peri-scapular musculature.   Consulted and Agree with Plan of Care Patient         Problem List There are no active problems to display for this patient.   Beacher May PT 06/27/2014, 5:33 PM  [place] PHYSICAL AND SPORTS MEDICINE [address] [place], [address] Phone: [phone]   Fax:  [phone]

## 2014-06-28 ENCOUNTER — Ambulatory Visit: Admitting: Physical Therapy

## 2014-06-28 DIAGNOSIS — G8929 Other chronic pain: Secondary | ICD-10-CM

## 2014-06-28 DIAGNOSIS — M25512 Pain in left shoulder: Secondary | ICD-10-CM | POA: Diagnosis not present

## 2014-06-28 DIAGNOSIS — M6281 Muscle weakness (generalized): Secondary | ICD-10-CM

## 2014-06-28 NOTE — Therapy (Signed)
[facility] [address], [place], [address] Phone: [phone]   Fax:  [phone]  Physical Therapy Treatment  Patient Details  Name: Suzanne Snyder MRN: [id number] Date of Birth: 08-17-1950 Referring Provider:  Carin Primrose, MD  Encounter Date: 06/28/2014      PT End of Session - 06/28/14 1143    Visit Number 7   Number of Visits 24   Date for PT Re-Evaluation 09/04/14   PT Start Time 1035   PT Stop Time 1120   PT Time Calculation (min) 45 min   Activity Tolerance Patient tolerated treatment well;No increased pain   Behavior During Therapy [facility] for tasks assessed/performed      Past Medical History  Diagnosis Date  . Anxiety   . Diabetes mellitus without complication   . Osteoporosis     Past Surgical History  Procedure Laterality Date  . 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 Spine surgery N/A 1984    L4-5, not sure of procedure  . Rotator cuff repair Left 2014 and 2015    There were no vitals filed for this visit.  Visit Diagnosis:  Chronic shoulder pain, left  Muscle weakness of left upper extremity      Subjective Assessment - 06/28/14 1141    Subjective Patient states her session with Hilda Lias went well yesterday. She cannot remember how she attached band to door nob though.    Pertinent History Patient reports history of pain in left soulder prior to 2014 with subsequent surgery, physical therapy and re repair in 2015. She has never fully recovered from the surgeries and is now hoping to avoid another surgery. She has not had an MRI recently.    Limitations House hold activities   Patient Stated Goals Patient would like to be able to lift, reach with left UE for improved function with daily activities with less pain    Currently in Pain? Yes   Pain Score 2    Pain Location Shoulder   Pain Orientation Left   Pain Type Chronic pain   Pain Onset More than a month ago                      Adult Aquatic Therapy - 06/28/14 1142    Aquatic Therapy Subjective   Subjective Patient reports that prior to coming her blood sugar was 29. Not feeling great.    Treatment   Specific Exercises Shoulder   Shoulder Patient performed large shoulder circles, shoulder retraction, shoulder extension, shoulder adduction, elbow flexion/extension, IR/ER, shoulder rolls in each direction, reaching out and around, reaching out and down. Using kickboard for push/pull.  All activities performed bilaterally and for approximately 2 min each. Each activity performed in 4'6" of water.             [facility] Adult PT Treatment/Exercise - 06/27/14 0922    Posture/Postural Control   Posture Comments sitting/standing: left shoulder more forward than right, + winging of left scpaula, + hiking left shoulder   Exercises   Exercises Other Exercises   Other Exercises  standing at cable lat pull downs with 10# and verbal cuing for correct alignment of shoulder/scapula, seated scapular rows with 10# both UEs with controlled movements and verbal and tactile cuing for engaging lower trapezius, single arm row with 5# controlled with verbal cuing, instructed in scapular adduction and shoulder extension to side of thighs/trunk with resistive band  PT Education - 06/28/14 1143    Education provided Yes   Education Details aquatic exercises and proper form   Methods Explanation;Demonstration   Comprehension Verbalized understanding;Returned demonstration             PT Long Term Goals - 06/19/14 1308    PT LONG TERM GOAL #1   Title Patient will improve AROM to 130 degrees left shoulder with mild discomfort for reaching into cabilint or performing hair care by 07/13/2014   Status New   PT LONG TERM GOAL #2   Title Patient will report pain level decrease from 8/10 to 5/10 with aggravating activities involving left UE/shoulder by 07/13/2014   Baseline 8/10 worst  reported pain level   Status New   PT LONG TERM GOAL #3   Title Patient will improve Quick Dash score to 30% or less by 07/27/2014 indicating improved functional use with left UE with less difficulty and pain   Baseline initially 42% to 50% impairment   Status New   PT LONG TERM GOAL #4   Title Patient will improve strength in left UE shoulder stabilizers with noted improved posture and decreased winging of scapula and imporved overhead use of left UE by 08/17/2014   Status New   PT LONG TERM GOAL #5   Title Patient will improve quickDash score to 20% or less by 09/04/2014 indicating improved functional use with decreased pain in left shoulder   Baseline initial 42% to 50% impairment   Status New   PT LONG TERM GOAL #6   Title Patient will  be independent with home program for strength and pain control for left UE/shoulder by 09/04/2014   Baseline limited knowledge of pain control strategies or progression of exercises for left UE   Status New               Plan - 06/28/14 1144    Clinical Impression Statement Patient continues to have difficulty with reaching activities in aquatic environment. Needs shoulder complex strengthening on left.    Pt will benefit from skilled therapeutic intervention in order to improve on the following deficits Decreased activity tolerance;Pain   Rehab Potential Fair   Clinical Impairments Affecting Rehab Potential multiple surgeries left shoulder for rotator cuff tears without complete return to full function following either surgery   PT Frequency 2x / week   PT Duration 12 weeks   PT Treatment/Interventions Aquatic Therapy;Therapeutic exercise   PT Next Visit Plan Motor control/movement patterning, increase her peri-scapular musculature.   Consulted and Agree with Plan of Care Patient        Problem List There are no active problems to display for this patient.   Michaelia Beilfuss, PT  06/28/2014, 11:45 AM  [facility] PHYSICAL AND SPORTS MEDICINE [address] [place], [address] Phone: [phone]   Fax:  [phone]

## 2014-07-02 ENCOUNTER — Encounter: Payer: Self-pay | Admitting: Physical Therapy

## 2014-07-02 ENCOUNTER — Ambulatory Visit: Admitting: Physical Therapy

## 2014-07-02 DIAGNOSIS — G8929 Other chronic pain: Secondary | ICD-10-CM

## 2014-07-02 DIAGNOSIS — M25512 Pain in left shoulder: Principal | ICD-10-CM

## 2014-07-02 DIAGNOSIS — M6281 Muscle weakness (generalized): Secondary | ICD-10-CM

## 2014-07-02 NOTE — Therapy (Signed)
[facility] [address], [place], [address] Phone: [phone]   Fax:  [phone]  Physical Therapy Treatment  Patient Details  Name: CHIQUETA LOTTI MRN: [id number] Date of Birth: 1950-12-07 Referring Provider:  Carin Primrose, MD  Encounter Date: 07/02/2014      PT End of Session - 07/02/14 1039    Visit Number 8   Number of Visits 24   Date for PT Re-Evaluation 09/04/14   PT Start Time 1000   PT Stop Time 1032   PT Time Calculation (min) 32 min   Activity Tolerance Patient tolerated treatment well;No increased pain   Behavior During Therapy [facility] for tasks assessed/performed      Past Medical History  Diagnosis Date  . Anxiety   . Diabetes mellitus without complication   . Osteoporosis     Past Surgical History  Procedure Laterality Date  . 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 Spine surgery N/A 1984    L4-5, not sure of procedure  . Rotator cuff repair Left 2014 and 2015    There were no vitals filed for this visit.  Visit Diagnosis:  Chronic shoulder pain, left  Muscle weakness of left upper extremity      Subjective Assessment - 07/02/14 1002    Subjective Patient reports she had a glucose level of 35 an hour prior to attending therapy treatment today. current glucose reading is 164. currently no pain reported in shoulder and she feels she is getting stronger, just feels some tightness in back of shoulders today.   Patient Stated Goals Patient would like to be able to lift, reach with left UE for improved function with daily activities with less pain    Currently in Pain? No/denies   Pain Orientation Left   Pain Descriptors / Indicators Tightness   Pain Type Chronic pain   Pain Onset More than a month ago   Pain Frequency Intermittent   Multiple Pain Sites No          [facility] Adult PT Treatment/Exercise - 07/02/14 1009    Posture/Postural Control   Posture Comments  sitting/standing: left shoulder more forward than right, + winging of left scpaula, + hiking left shoulder   Exercises   Exercises Other Exercises   Other Exercises  standing at cable lat pull downs with 10# 2 x 10 and verbal/tactile cuing for correct alignment of shoulder/scapula, seated scapular rows with 10# both UEs x 10 reps with controlled movements and verbal/tactile and tactile cuing for engaging lower trapezius, single arm row with 5# 3 x 5 reps controlled with verbal and tactile cuing, partial push ups off wall 2 sets 10/5 reps with verbal cuing and demonstration, at wall scaption alternating with manual resistive ER through partial range 2 sets x 10/5 reps,  re instructed in scapular adduction and shoulder extension to side of thighs/trunk with resistive band for correct posture and ROM      Patient response to treatment: improved control of left shoulder/scapula with tactile and verbal cuing, fatigued with repetition during all exercises left shoulder          PT Education - 07/02/14 1038    Education provided Yes   Education Details re instructed in standing resistive tubing for scapular control/rows and shoulder extension with verbal and tactile cues   Person(s) Educated Patient   Methods Explanation;Demonstration;Tactile cues;Verbal cues   Comprehension Verbalized understanding;Returned demonstration;Verbal cues required;Tactile cues required  PT Long Term Goals - 06/19/14 1308    PT LONG TERM GOAL #1   Title Patient will improve AROM to 130 degrees left shoulder with mild discomfort for reaching into cabilint or performing hair care by 07/13/2014   Status New   PT LONG TERM GOAL #2   Title Patient will report pain level decrease from 8/10 to 5/10 with aggravating activities involving left UE/shoulder by 07/13/2014   Baseline 8/10 worst reported pain level   Status New   PT LONG TERM GOAL #3   Title Patient will improve Quick Dash score to 30% or less by  07/27/2014 indicating improved functional use with left UE with less difficulty and pain   Baseline initially 42% to 50% impairment   Status New   PT LONG TERM GOAL #4   Title Patient will improve strength in left UE shoulder stabilizers with noted improved posture and decreased winging of scapula and imporved overhead use of left UE by 08/17/2014   Status New   PT LONG TERM GOAL #5   Title Patient will improve quickDash score to 20% or less by 09/04/2014 indicating improved functional use with decreased pain in left shoulder   Baseline initial 42% to 50% impairment   Status New   PT LONG TERM GOAL #6   Title Patient will  be independent with home program for strength and pain control for left UE/shoulder by 09/04/2014   Baseline limited knowledge of pain control strategies or progression of exercises for left UE   Status New               Plan - 07/02/14 1040    Clinical Impression Statement Patient demonstrated improved control and strength in left shoulder/scapular region with exercises today. She fatigued with repetition and this should improve with continued physical therapy intervention.    Pt will benefit from skilled therapeutic intervention in order to improve on the following deficits Decreased strength;Decreased activity tolerance;Decreased endurance   Rehab Potential Fair   Clinical Impairments Affecting Rehab Potential multiple surgeries left shoulder for rotator cuff tears without complete return to full function following either surgery   PT Frequency 2x / week   PT Duration 12 weeks   PT Treatment/Interventions Aquatic Therapy;Therapeutic exercise   PT Next Visit Plan Motor control/movement patterning, increase her peri-scapular musculature.   PT Home Exercise Plan re instruct exercises as needed, continue with home program as instructed        Problem List There are no active problems to display for this patient.   Beacher May PT 07/02/2014, 10:42 AM  [facility] SPORTS MEDICINE [address] [place], [address] Phone: [phone]   Fax:  [phone]

## 2014-07-03 ENCOUNTER — Ambulatory Visit: Admitting: Physical Therapy

## 2014-07-03 ENCOUNTER — Ambulatory Visit: Payer: Self-pay | Admitting: Physical Therapy

## 2014-07-04 ENCOUNTER — Encounter: Admitting: Physical Therapy

## 2014-07-05 ENCOUNTER — Ambulatory Visit: Payer: Self-pay | Admitting: Physical Therapy

## 2014-07-06 ENCOUNTER — Ambulatory Visit: Admitting: Physical Therapy

## 2014-07-06 ENCOUNTER — Encounter: Payer: Self-pay | Admitting: Physical Therapy

## 2014-07-06 DIAGNOSIS — G8929 Other chronic pain: Secondary | ICD-10-CM

## 2014-07-06 DIAGNOSIS — M6281 Muscle weakness (generalized): Secondary | ICD-10-CM

## 2014-07-06 DIAGNOSIS — M25512 Pain in left shoulder: Principal | ICD-10-CM

## 2014-07-06 NOTE — Therapy (Signed)
[place] [facility] [address] [place], [address] Phone: [phone]   Fax:  [phone]  Physical Therapy Treatment  Patient Details  Name: Suzanne Snyder MRN: [id number] Date of Birth: May 30, 1950 Referring Provider:  Carin Primrose, MD  Encounter Date: 07/06/2014      PT End of Session - 07/06/14 1505    Visit Number 9   Number of Visits 24   Date for PT Re-Evaluation 09/04/14   PT Start Time 1114   PT Stop Time 1145   PT Time Calculation (min) 31 min   Activity Tolerance Patient tolerated treatment well   Behavior During Therapy [facility] for tasks assessed/performed      Past Medical History  Diagnosis Date  . Anxiety   . Diabetes mellitus without complication   . Osteoporosis     Past Surgical History  Procedure Laterality Date  . 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 Spine surgery N/A 1984    L4-5, not sure of procedure  . Rotator cuff repair Left 2014 and 2015    There were no vitals filed for this visit.  Visit Diagnosis:  Chronic shoulder pain, left  Muscle weakness of left upper extremity      Subjective Assessment - 07/06/14 1116    Subjective Patient reports she is ok with glucose level s today. She is currently having some stiffness in her left shoulder and is stiff with moving arm up and down.    Limitations House hold activities   Patient Stated Goals Patient would like to be able to lift, reach with left UE for improved function with daily activities with less pain    Currently in Pain? Yes   Pain Score 2    Pain Location Shoulder   Pain Orientation Left   Pain Descriptors / Indicators Tightness   Pain Type Chronic pain   Pain Onset More than a month ago   Pain Frequency Intermittent   Multiple Pain Sites No          [facility] Adult PT Treatment/Exercise - 07/06/14 1119    Posture/Postural Control   Posture Comments sitting/standing: left shoulder more forward than  right, + winging of left scpaula, + hiking left shoulder, improving   Exercises   Exercises Other Exercises   Other Exercises  Seated in chair at cable lat pull downs with 10# 2 x 10 and verbal/tactile cuing for correct alignment of shoulder/scapula with guidance through ROM, seated scapular rows with 10# both UEs x 10 reps with controlled movements and verbal/tactile and tactile cuing for engaging lower trapezius, single arm row with 5# 3 x 5 reps controlled with verbal and tactile cuing, partial push ups/chest press with therapist manual resistance 2 sets 10 reps with verbal cuing, seated manual resistive ER through partial range 2 sets x 10 reps,  Instructed in single arm row with 3# weight for home program with written instruction given, performed with demonstration and verbal cues 10 reps      Patient response to treatment: improved strength/control left shoulder/scapular stabilizers with guidance/verbal cuing throughout exercises          PT Education - 07/06/14 1145    Education provided Yes   Education Details instructed in home exercises with new exercise: single arm row in partial lunge position, instructed to perform exercises through partial ROM and decreased hiking of left shoulder to avoid popping and cracking in left shoulder   Person(s) Educated Patient  Methods Explanation;Demonstration;Verbal cues;Tactile cues   Comprehension Verbalized understanding;Returned demonstration;Verbal cues required;Tactile cues required             PT Long Term Goals - 06/19/14 1308    PT LONG TERM GOAL #1   Title Patient will improve AROM to 130 degrees left shoulder with mild discomfort for reaching into cabilint or performing hair care by 07/13/2014   Status New   PT LONG TERM GOAL #2   Title Patient will report pain level decrease from 8/10 to 5/10 with aggravating activities involving left UE/shoulder by 07/13/2014   Baseline 8/10 worst reported pain level   Status New   PT LONG  TERM GOAL #3   Title Patient will improve Quick Dash score to 30% or less by 07/27/2014 indicating improved functional use with left UE with less difficulty and pain   Baseline initially 42% to 50% impairment   Status New   PT LONG TERM GOAL #4   Title Patient will improve strength in left UE shoulder stabilizers with noted improved posture and decreased winging of scapula and imporved overhead use of left UE by 08/17/2014   Status New   PT LONG TERM GOAL #5   Title Patient will improve quickDash score to 20% or less by 09/04/2014 indicating improved functional use with decreased pain in left shoulder   Baseline initial 42% to 50% impairment   Status New   PT LONG TERM GOAL #6   Title Patient will  be independent with home program for strength and pain control for left UE/shoulder by 09/04/2014   Baseline limited knowledge of pain control strategies or progression of exercises for left UE   Status New               Plan - 07/06/14 1150    Clinical Impression Statement Patient is progressing with improved knowledge of proper posture/positioning of left shoulder with exercises and improving strength in left scapula and shoulder. she continues with weakness and pain as primary limiting factors to improved function and will benefit from additional physical therapy intervention.    Pt will benefit from skilled therapeutic intervention in order to improve on the following deficits Decreased strength;Decreased activity tolerance;Decreased endurance   Rehab Potential Fair   Clinical Impairments Affecting Rehab Potential multiple surgeries left shoulder for rotator cuff tears without complete return to full function following either surgery   PT Frequency 2x / week   PT Duration 12 weeks   PT Treatment/Interventions Aquatic Therapy;Patient/family education;Therapeutic exercise;Manual techniques   PT Next Visit Plan Motor control/movement patterning, increase her peri-scapular musculature.   PT  Home Exercise Plan re instruct exercises as needed, continue with home program as instructed        Problem List There are no active problems to display for this patient.   Beacher May PT 07/06/2014, 3:09 PM  [facility] PHYSICAL AND SPORTS MEDICINE [address] [place], [address] Phone: [phone]   Fax:  [phone]

## 2014-07-09 ENCOUNTER — Encounter: Payer: Self-pay | Admitting: Physical Therapy

## 2014-07-09 ENCOUNTER — Ambulatory Visit: Admitting: Physical Therapy

## 2014-07-09 DIAGNOSIS — M6281 Muscle weakness (generalized): Secondary | ICD-10-CM

## 2014-07-09 DIAGNOSIS — M25512 Pain in left shoulder: Principal | ICD-10-CM

## 2014-07-09 DIAGNOSIS — G8929 Other chronic pain: Secondary | ICD-10-CM

## 2014-07-09 NOTE — Therapy (Signed)
[place] [facility]TS MEDICINE [address] [place], [address] Phone: [phone]   Fax:  [phone]  Physical Therapy Treatment  Patient Details  Name: Suzanne Snyder MRN: [id number] Date of Birth: 1950/02/28 Referring Provider:  Carin Primrose, MD  Encounter Date: 07/09/2014      PT End of Session - 07/09/14 0914    Visit Number 10   Number of Visits 24   Date for PT Re-Evaluation 09/04/14   PT Start Time 0905   PT Stop Time 0940   PT Time Calculation (min) 35 min   Activity Tolerance Patient tolerated treatment well   Behavior During Therapy [facility] for tasks assessed/performed      Past Medical History  Diagnosis Date  . Anxiety   . Diabetes mellitus without complication   . Osteoporosis     Past Surgical History  Procedure Laterality Date  . 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 Spine surgery N/A 1984    L4-5, not sure of procedure  . Rotator cuff repair Left 2014 and 2015    There were no vitals filed for this visit.  Visit Diagnosis:  Chronic shoulder pain, left  Muscle weakness of left upper extremity      Subjective Assessment - 07/09/14 0908    Subjective Patient reports she is hurting today all over because she did a lot over the weekend with changing beds with large quilts and repetitive motion of shoulders.    Limitations House hold activities   Patient Stated Goals Patient would like to be able to lift, reach with left UE for improved function with daily activities with less pain    Currently in Pain? Yes   Pain Score 7    Pain Location Shoulder   Pain Orientation Left   Pain Descriptors / Indicators Heaviness;Shooting;Aching   Pain Type Chronic pain   Pain Onset More than a month ago   Pain Frequency Intermittent   Multiple Pain Sites No      Palpation: increased spasms palpable along left upper trapezius muscle AROM: left shoulder WNL with crepitus throughout ROM pre  treatment Strength: decreased ER, forward elevation with pain reported in left shoulder with resistance to movements      [facility] Adult PT Treatment/Exercise - 07/09/14 0911    Posture/Postural Control   Posture Comments sitting/standing: left shoulder more forward than right,+ hiking left shoulder   Exercises   Exercises Other Exercises   Other Exercises  Instructed in isometric exercises at wall: FF, ER and IR x 5 reps with assistance to hold towel against wall, standing at cable lat pull downs with 10# 1 x 10 and verbal/tactile cuing for correct alignment of shoulder/scapula, seated reverse chin ups with lat bar x 10 reps through short arc to control pain/crepitus in left shoulder, seated scapular rows with 10# both UEs x 10 reps with controlled movements and verbal/tactile and tactile cuing for engaging lower trapezius, single arm rows in standing with 2# weight in hand x 10 reps, supine lying: rhythmic stabilization at 90 degrees forward elevation 3 sets with moderate resistance given      Manual therapy: STM left shoulder girdle with concentration on upper trapezius muscle with patient seated prior to scapular exercises at [facility] cable machine   Patient response to treatment: decreased spasms with improved control of shoulder exercises with less shoulder crepitus noted. Required tactile and verbal cues to perform exercises with good alignment and through appropriate ROM  to not aggravate pain        PT Education - 07/09/14 0912    Education provided Yes   Education Details instructed in modifying exercises to control symptoms/pain with lighter exercises without increased weight and isometric shoulder exercises at door   Person(s) Educated Patient   Methods Demonstration;Explanation;Verbal cues   Comprehension Verbalized understanding;Returned demonstration;Verbal cues required             PT Long Term Goals - 06/19/14 1308    PT LONG TERM GOAL #1   Title Patient will improve  AROM to 130 degrees left shoulder with mild discomfort for reaching into cabilint or performing hair care by 07/13/2014   Status New   PT LONG TERM GOAL #2   Title Patient will report pain level decrease from 8/10 to 5/10 with aggravating activities involving left UE/shoulder by 07/13/2014   Baseline 8/10 worst reported pain level   Status New   PT LONG TERM GOAL #3   Title Patient will improve Quick Dash score to 30% or less by 07/27/2014 indicating improved functional use with left UE with less difficulty and pain   Baseline initially 42% to 50% impairment   Status New   PT LONG TERM GOAL #4   Title Patient will improve strength in left UE shoulder stabilizers with noted improved posture and decreased winging of scapula and imporved overhead use of left UE by 08/17/2014   Status New   PT LONG TERM GOAL #5   Title Patient will improve quickDash score to 20% or less by 09/04/2014 indicating improved functional use with decreased pain in left shoulder   Baseline initial 42% to 50% impairment   Status New   PT LONG TERM GOAL #6   Title Patient will  be independent with home program for strength and pain control for left UE/shoulder by 09/04/2014   Baseline limited knowledge of pain control strategies or progression of exercises for left UE   Status New               Plan - 07/09/14 0945    Clinical Impression Statement Patient responded with decreased shoulder pain and stiffness with treatment modifications of isometrics and light weights. She continues to require cuing to perform and modify exercises in order to strengthen appropriately.    Pt will benefit from skilled therapeutic intervention in order to improve on the following deficits Decreased strength;Decreased activity tolerance;Decreased endurance   Rehab Potential Fair   PT Frequency 2x / week   PT Duration 12 weeks   PT Treatment/Interventions Aquatic Therapy;Patient/family education;Therapeutic exercise;Manual techniques    PT Next Visit Plan Motor control/movement patterning, increase her peri-scapular musculature.        Problem List There are no active problems to display for this patient.   Beacher May PT 07/09/2014, 9:47 AM  [place] [facility] PHYSICAL AND SPORTS MEDICINE [address] [place], [address] Phone: [phone]   Fax:  [phone]

## 2014-07-10 ENCOUNTER — Ambulatory Visit: Payer: Self-pay | Admitting: Physical Therapy

## 2014-07-10 ENCOUNTER — Ambulatory Visit: Admitting: Physical Therapy

## 2014-07-12 ENCOUNTER — Ambulatory Visit: Payer: Self-pay | Admitting: Physical Therapy

## 2014-07-13 ENCOUNTER — Ambulatory Visit: Attending: Orthopedic Surgery | Admitting: Physical Therapy

## 2014-07-13 DIAGNOSIS — M25512 Pain in left shoulder: Secondary | ICD-10-CM | POA: Insufficient documentation

## 2014-07-13 DIAGNOSIS — G8929 Other chronic pain: Secondary | ICD-10-CM | POA: Insufficient documentation

## 2014-07-13 DIAGNOSIS — M6281 Muscle weakness (generalized): Secondary | ICD-10-CM | POA: Insufficient documentation

## 2014-07-18 ENCOUNTER — Ambulatory Visit: Admitting: Physical Therapy

## 2014-07-18 ENCOUNTER — Encounter: Payer: Self-pay | Admitting: Physical Therapy

## 2014-07-18 DIAGNOSIS — G8929 Other chronic pain: Secondary | ICD-10-CM

## 2014-07-18 DIAGNOSIS — M25512 Pain in left shoulder: Secondary | ICD-10-CM | POA: Diagnosis not present

## 2014-07-18 DIAGNOSIS — M6281 Muscle weakness (generalized): Secondary | ICD-10-CM

## 2014-07-18 NOTE — Therapy (Signed)
[place] [facility] [address] [place], [place], [id number] Phone: [phone]   Fax:  [phone]  Physical Therapy Treatment  Patient Details  Name: Suzanne Snyder MRN: [id number] Date of Birth: 10/29/1950 Referring Provider:  Carin PrimroseKamath, Ganesh, MD  Encounter Date: 07/18/2014      PT End of Session - 07/18/14 1013    Visit Number 11   Number of Visits 24   Date for PT Re-Evaluation 09/04/14   PT Start Time 0925   PT Stop Time 1000   PT Time Calculation (min) 35 min   Activity Tolerance Patient tolerated treatment well   Behavior During Therapy [facility] for tasks assessed/performed      Past Medical History  Diagnosis Date  . Anxiety   . Diabetes mellitus without complication   . Osteoporosis     Past Surgical History  Procedure Laterality Date  . 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 Spine surgery N/A 1984    L4-5, not sure of procedure  . Rotator cuff repair Left 2014 and 2015    There were no vitals filed for this visit.  Visit Diagnosis:  Chronic shoulder pain, left  Muscle weakness of left upper extremity      Subjective Assessment - 07/18/14 0929    Subjective Patient reports she was sick last week with virus and fever and is now just feeling better as of today. Left shoulder is "making a lot of noise". She was unable to exercise due to being sick,    Patient Stated Goals Patient would like to be able to lift, reach with left UE for improved function with daily activities with less pain    Currently in Pain? Yes   Pain Score 3    Pain Location Shoulder   Pain Orientation Left   Pain Descriptors / Indicators Aching;Sore   Pain Type Chronic pain   Pain Onset More than a month ago   Pain Frequency Intermittent   Multiple Pain Sites No     Objective: Palpation of left GHJ and scpaulo thoracic joint: increased crepitus and grinding throughout ROM with forward elevation and all motions today       [facility] Adult PT Treatment/Exercise - 07/18/14 0934    Posture/Postural Control   Posture Comments sitting/standing: left shoulder more forward than right, + winging of left scpaula, + hiking left shoulder   Exercises   Exercises Other Exercises   Other Exercises  today: all isometric exercises in supine lying, forward elevation at 90 and 100 degrees, extension at 90, ER in plane of the scapula 2-3 sets x 10 reps mild to moderate resistance with verbal cuing, scapualr adduction 2 x 10 reps with moderate resistance given, attempted forward elevation in supine with 4# and 3# weights with both hands holding weight with increased grinding in joint therefore discontinued exercise      Patient response to treatment: Patient demonstrated decreased grinding and pain in left shoulder with modification of exercises and verbal cuing of therapist for correct positioning and intensity of exercise          PT Education - 07/18/14 1000    Education provided Yes   Education Details educated in modification of exercises to prevent grinding and pain in shoulder: isometric and supine lying only for now until next session   Person(s) Educated Patient   Methods Explanation;Demonstration;Verbal cues;Tactile cues   Comprehension Verbalized understanding;Returned demonstration;Verbal cues required;Tactile cues required  PT Long Term Goals - 06/19/14 1308    PT LONG TERM GOAL #1   Title Patient will improve AROM to 130 degrees left shoulder with mild discomfort for reaching into cabilint or performing hair care by 07/13/2014   Status New   PT LONG TERM GOAL #2   Title Patient will report pain level decrease from 8/10 to 5/10 with aggravating activities involving left UE/shoulder by 07/13/2014   Baseline 8/10 worst reported pain level   Status New   PT LONG TERM GOAL #3   Title Patient will improve Quick Dash score to 30% or less by 07/27/2014 indicating improved functional use with left UE  with less difficulty and pain   Baseline initially 42% to 50% impairment   Status New   PT LONG TERM GOAL #4   Title Patient will improve strength in left UE shoulder stabilizers with noted improved posture and decreased winging of scapula and imporved overhead use of left UE by 08/17/2014   Status New   PT LONG TERM GOAL #5   Title Patient will improve quickDash score to 20% or less by 09/04/2014 indicating improved functional use with decreased pain in left shoulder   Baseline initial 42% to 50% impairment   Status New   PT LONG TERM GOAL #6   Title Patient will  be independent with home program for strength and pain control for left UE/shoulder by 09/04/2014   Baseline limited knowledge of pain control strategies or progression of exercises for left UE   Status New               Plan - 07/18/14 1014    Clinical Impression Statement Increased grinding and pain on arrival most likely due to being sick last week and not able to exercise. She was  better with modification of exercises to isometric and supine lying only today. Results of decreased grinding and pain in left shoulder joint and scapulo thoracic joint with treatment.    Pt will benefit from skilled therapeutic intervention in order to improve on the following deficits Decreased strength;Decreased activity tolerance;Decreased endurance   Rehab Potential Fair   PT Frequency 2x / week   PT Duration 12 weeks   PT Treatment/Interventions Manual techniques;Cryotherapy;Therapeutic exercise;Patient/family education   PT Next Visit Plan Motor control/movement patterning, increase her peri-scapular musculature strength.   PT Home Exercise Plan modified exercises to isometric and supine lying only, no weights through ROM        Problem List There are no active problems to display for this patient.   Beacher May PT 07/18/2014, 10:19 AM  [facility] PHYSICAL AND SPORTS MEDICINE [address] [place], [address] Phone: [phone]   Fax:  [phone]

## 2014-07-20 ENCOUNTER — Ambulatory Visit: Admitting: Physical Therapy

## 2014-07-20 ENCOUNTER — Encounter: Payer: Self-pay | Admitting: Physical Therapy

## 2014-07-20 DIAGNOSIS — M6281 Muscle weakness (generalized): Secondary | ICD-10-CM

## 2014-07-20 DIAGNOSIS — M25512 Pain in left shoulder: Secondary | ICD-10-CM | POA: Diagnosis not present

## 2014-07-20 DIAGNOSIS — G8929 Other chronic pain: Secondary | ICD-10-CM

## 2014-07-20 NOTE — Therapy (Signed)
[place] [facility] [address] [place], [place], [address] Phone: [phone]   Fax:  [phone]  Physical Therapy Treatment  Patient Details  Name: Suzanne Snyder MRN: [id number] Date of Birth: 06/22/1950 Referring Provider:  Kamath, Ganesh, MD  Encounter Date: 07/20/2014      PT End of Session - 07/20/14 0947    Visit Number 12   Number of Visits 24   Date for PT Re-Evaluation 09/04/14   PT Start Time 0845   PT Stop Time 0920   PT Time Calculation (min) 35 min   Activity Tolerance Patient tolerated treatment well   Behavior During Therapy WFL for tasks assessed/performed      Past Medical History  Diagnosis Date  . Anxiety   . Diabetes mellitus without complication   . Osteoporosis     Past Surgical History  Procedure Laterality Date  . 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 Spine surgery N/A 1984    L4-5, not sure of procedure  . Rotator cuff repair Left 2014 and 2015    There were no vitals filed for this visit.  Visit Diagnosis:  Chronic shoulder pain, left  Muscle weakness of left upper extremity      Subjective Assessment - 07/20/14 0850    Subjective Patient reports she is doing better with less popping and grinding in her shoulder and is feeling stronger with activities using left shoulder.    Patient Stated Goals Patient would like to be able to lift, reach with left UE for improved function with daily activities with less pain    Currently in Pain? Yes   Pain Score 2    Pain Location Shoulder   Pain Orientation Left   Pain Descriptors / Indicators Aching;Sore   Pain Type Chronic pain   Pain Onset More than a month ago   Pain Frequency Intermittent   Multiple Pain Sites No          [facility] Adult PT Treatment/Exercise - 07/20/14 0946    Exercises   Exercises Other Exercises   Other Exercises  today: all isometric exercises in supine lying, forward elevation at 90 and 100  degrees, extension at 90, ER in plane of the scapula 2-3 sets x 10 reps mild to moderate resistance with verbal cuing, scapualr adduction 2 x 10 reps with moderate resistance given, 2# weights for short arc chest press 2 x 10 with tactile and verbal cuing, manual resistive shoulder extension to side of trunk x 10, instructed to perform at home with resistive band, side lying scapular control/strengthening exercises: elevation/depression x 10, ER off trunk with 5 second holds at end range and eccentric controlled lowering with verbal cuing throughout exercise.      Patient response to treatment: improved control with verbal cuing, mild popping/grinding in GHJ and scapulo thoracic joint with exercises, modified exercises with ROM through shorter arc to avoid grinding           PT Education - 07/20/14 0930    Education provided Yes   Education Details instructed to continue with isometric and supine lying and side lying exercises to avoid incresaed stress on shoulder joint   Person(s) Educated Patient   Methods Explanation;Demonstration;Verbal cues   Comprehension Verbalized understanding;Returned demonstration;Verbal cues required             PT Long Term Goals - 07/20/14 0951    PT LONG TERM GOAL #1   Title Patient will   improve AROM to 130 degrees left shoulder with mild discomfort for reaching into cabilint or performing hair care by 07/13/2014   Status Partially Met   PT LONG TERM GOAL #2   Title Patient will report pain level decrease from 8/10 to 5/10 with aggravating activities involving left UE/shoulder by 07/13/2014   Status Achieved   PT LONG TERM GOAL #3   Title Patient will improve Quick Dash score to 30% or less by 07/27/2014 indicating improved functional use with left UE with less difficulty and pain   Status On-going   PT LONG TERM GOAL #4   Title Patient will improve strength in left UE shoulder stabilizers with noted improved posture and decreased winging of scapula  and imporved overhead use of left UE by 08/17/2014   Status On-going   PT LONG TERM GOAL #5   Title Patient will improve quickDash score to 20% or less by 09/04/2014 indicating improved functional use with decreased pain in left shoulder   Status On-going   PT LONG TERM GOAL #6   Title Patient will  be independent with home program for strength and pain control for left UE/shoulder by 09/04/2014   Status On-going               Plan - 07/20/14 0949    Clinical Impression Statement Patient with improved pain and less popping in shoulder today and mild to no grinding of GHJ or scapulo thoracic joint today with exercises. She continues with weakness and fatigues with exercises and will benefit from continued physical therapy intervention to improve funcitonal use of left UE with daily tasks.    Pt will benefit from skilled therapeutic intervention in order to improve on the following deficits Decreased strength;Decreased activity tolerance;Decreased endurance   PT Frequency 2x / week   PT Duration 12 weeks   PT Treatment/Interventions Manual techniques;Cryotherapy;Therapeutic exercise;Patient/family education   PT Next Visit Plan Motor control/movement patterning, increase musculature strength.   PT Home Exercise Plan modified exercises to isometric and supine lying only, no weights through ROM        Problem List There are no active problems to display for this patient.   Jomarie Longs PT 07/20/2014, 9:53 AM  [place] PH[facility] AND SPORTS MEDICINE [address] [place], [address] Phone: [phone]   Fax:  [phone]

## 2014-07-24 ENCOUNTER — Encounter: Payer: Self-pay | Admitting: Physical Therapy

## 2014-07-24 ENCOUNTER — Ambulatory Visit: Admitting: Physical Therapy

## 2014-07-24 DIAGNOSIS — M25512 Pain in left shoulder: Secondary | ICD-10-CM | POA: Diagnosis not present

## 2014-07-24 DIAGNOSIS — M6281 Muscle weakness (generalized): Secondary | ICD-10-CM

## 2014-07-24 DIAGNOSIS — G8929 Other chronic pain: Secondary | ICD-10-CM

## 2014-07-24 NOTE — Therapy (Signed)
[place] [facility] SPORTS MEDICINE [address] [place], [address] Phone: [phone]   Fax:  [phone]  Physical Therapy Treatment  Patient Details  Name: Suzanne Snyder MRN: [id number] Date of Birth: 23-Jul-1950 Referring Provider:  Henry Russel, MD  Encounter Date: 07/24/2014      PT End of Session - 07/24/14 0955    Visit Number 13   Number of Visits 24   Date for PT Re-Evaluation 09/04/14   PT Start Time 0904   PT Stop Time 0948   PT Time Calculation (min) 44 min   Activity Tolerance Patient tolerated treatment well   Behavior During Therapy [facility] for tasks assessed/performed      Past Medical History  Diagnosis Date  . Anxiety   . Diabetes mellitus without complication   . Osteoporosis     Past Surgical History  Procedure Laterality Date  . 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 Spine surgery N/A 1984    L4-5, not sure of procedure  . Rotator cuff repair Left 2014 and 2015    There were no vitals filed for this visit.  Visit Diagnosis:  Chronic shoulder pain, left  Muscle weakness of left upper extremity      Subjective Assessment - 07/24/14 0905    Subjective Patient reports she is doing better with less popping and grinding in her shoulder and is feeling stronger with left shoulder. Overall she is doing much better.    Patient Stated Goals Patient would like to be able to lift, reach with left UE for improved function with daily activities with less pain    Currently in Pain? Yes   Pain Score 2    Pain Location Shoulder   Pain Orientation Left   Pain Descriptors / Indicators Aching;Sore   Pain Type Chronic pain   Pain Onset More than a month ago   Pain Frequency Intermittent   Multiple Pain Sites No           [facility] Adult PT Treatment/Exercise - 07/24/14 0907    Exercises   Exercises Other Exercises   Other Exercises  today: all isometric exercises in supine lying, forward elevation  at 90 and 100 degrees, extension at 90, ER in plane of the scapula 2-3 sets x 10 reps mild to moderate resistance with verbal cuing, supine lying scapualr adduction 2 x 10 reps with moderate resistance given, 3# weight held in both hands for chest press x 10 reps, side lying ER with 2# weight and assistance of therapist 3 x 5 reps, sitting isometric ER x 5 reps, instructed in use of resistive band for isometric exercises, shoulder elevation and depression x 10 reps with verbal cues for control, standing UE forward and back and side to side with UE Ranger with tactile and verbal cuing       Patient response to treatment: Patient able to perform exercises with minimal cuing and assistance, minimal popping noted in shoulder girdle throughout treatment. Patient fatigued with repetition of exercise          PT Education - 07/24/14 0944    Education provided Yes   Education Details continue with supine exercises, isometrics and scapular control in side lying and avoid heavy resistance at this time   Person(s) Educated Patient   Methods Explanation;Verbal cues;Tactile cues   Comprehension Verbalized understanding;Returned demonstration;Verbal cues required;Tactile cues required             PT Long  Term Goals - 07/20/14 0951    PT LONG TERM GOAL #1   Title Patient will improve AROM to 130 degrees left shoulder with mild discomfort for reaching into cabilint or performing hair care by 07/13/2014   Status Partially Met   PT LONG TERM GOAL #2   Title Patient will report pain level decrease from 8/10 to 5/10 with aggravating activities involving left UE/shoulder by 07/13/2014   Status Achieved   PT LONG TERM GOAL #3   Title Patient will improve Quick Dash score to 30% or less by 07/27/2014 indicating improved functional use with left UE with less difficulty and pain   Status On-going   PT LONG TERM GOAL #4   Title Patient will improve strength in left UE shoulder stabilizers with noted improved  posture and decreased winging of scapula and imporved overhead use of left UE by 08/17/2014   Status On-going   PT LONG TERM GOAL #5   Title Patient will improve quickDash score to 20% or less by 09/04/2014 indicating improved functional use with decreased pain in left shoulder   Status On-going   PT LONG TERM GOAL #6   Title Patient will  be independent with home program for strength and pain control for left UE/shoulder by 09/04/2014   Status On-going               Plan - 07/24/14 0958    Clinical Impression Statement Patient demonstrates improving strength and endurance in left UEwith decresaed left shoulder pain with current treatment. She continues wiht weakness and will require continued physical therapy in order to transition to independent home program.    Pt will benefit from skilled therapeutic intervention in order to improve on the following deficits Decreased strength;Decreased activity tolerance;Decreased endurance   Rehab Potential Fair   PT Frequency 2x / week   PT Duration 12 weeks   PT Treatment/Interventions Manual techniques;Cryotherapy;Therapeutic exercise;Patient/family education        Problem List There are no active problems to display for this patient.   Jomarie Longs PT 07/24/2014, 2:00 PM  [place] [facility] AND SPORTS MEDICINE [address] [place], [address] Phone: [phone]   Fax:  [phone]

## 2014-07-27 ENCOUNTER — Encounter: Payer: Self-pay | Admitting: Physical Therapy

## 2014-07-27 ENCOUNTER — Ambulatory Visit: Admitting: Physical Therapy

## 2014-07-27 DIAGNOSIS — G8929 Other chronic pain: Secondary | ICD-10-CM

## 2014-07-27 DIAGNOSIS — M25512 Pain in left shoulder: Secondary | ICD-10-CM | POA: Diagnosis not present

## 2014-07-27 DIAGNOSIS — M6281 Muscle weakness (generalized): Secondary | ICD-10-CM

## 2014-07-27 NOTE — Therapy (Signed)
[facility] [address], [place], [address] Phone: [phone]   Fax:  [phone]  Physical Therapy Treatment  Patient Details  Name: ANNAGRACE CARR MRN: [id number] Date of Birth: 21-Dec-1950 Referring Provider:  Henry Russel, MD  Encounter Date: 07/27/2014      PT End of Session - 07/27/14 0925    Visit Number 14   Number of Visits 24   Date for PT Re-Evaluation 09/04/14   PT Start Time 0850   PT Stop Time 0920   PT Time Calculation (min) 30 min   Activity Tolerance Patient tolerated treatment well   Behavior During Therapy [facility] for tasks assessed/performed      Past Medical History  Diagnosis Date  . Anxiety   . Diabetes mellitus without complication   . Osteoporosis     Past Surgical History  Procedure Laterality Date  . 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 Spine surgery N/A 1984    L4-5, not sure of procedure  . Rotator cuff repair Left 2014 and 2015    There were no vitals filed for this visit.  Visit Diagnosis:  Chronic shoulder pain, left  Muscle weakness of left upper extremity      Subjective Assessment - 07/27/14 0852    Subjective Patient reports she is doing better with less popping and grinding in her shoulder and is feeling stronger with left shoulder. Overall she is doing much better.    Patient Stated Goals Patient would like to be able to lift, reach with left UE for improved function with daily activities with less pain    Currently in Pain? Yes   Pain Score 2    Pain Location Shoulder   Pain Orientation Left   Pain Descriptors / Indicators Aching   Pain Type Chronic pain   Pain Onset More than a month ago          [facility] Adult PT Treatment/Exercise - 07/27/14 0853    Exercises   Exercises Other Exercises   Other Exercises  today:  isometric exercises in supine lying, forward elevation at 90 and 100 degrees, extension at side 2 x 10 reps, ER in plane  of the scapula 2-3 sets x 10 reps mild to moderate resistance with verbal cuing, scapualr adduction 2 x 10 reps with moderate resistance given with patient in side lying 2 x 10 reps, supine lying forward elevation with 2# ball overhead to ~ 90 degrees at shoulder, sitting forward flexion with both hands held together        Patient response to treatment: improved strength noted with exercises, improved ability to perform exercises with minimal cuing an assistance          PT Education - 07/27/14 0950    Education provided Yes   Education Details Instructed in exercises to continue at home and to modify exercise to avoid hiking of shoulder and perform exercises to 90 degrees of elevation and not above shoulder level    Person(s) Educated Patient   Methods Explanation;Verbal cues   Comprehension Verbalized understanding;Returned demonstration;Verbal cues required             PT Long Term Goals - 07/20/14 0951    PT LONG TERM GOAL #1   Title Patient will improve AROM to 130 degrees left shoulder with mild discomfort for reaching into cabilint or performing hair care by 07/13/2014   Status Partially Met   PT LONG TERM  GOAL #2   Title Patient will report pain level decrease from 8/10 to 5/10 with aggravating activities involving left UE/shoulder by 07/13/2014   Status Achieved   PT LONG TERM GOAL #3   Title Patient will improve Quick Dash score to 30% or less by 07/27/2014 indicating improved functional use with left UE with less difficulty and pain   Status On-going   PT LONG TERM GOAL #4   Title Patient will improve strength in left UE shoulder stabilizers with noted improved posture and decreased winging of scapula and imporved overhead use of left UE by 08/17/2014   Status On-going   PT LONG TERM GOAL #5   Title Patient will improve quickDash score to 20% or less by 09/04/2014 indicating improved functional use with decreased pain in left shoulder   Status On-going   PT LONG TERM  GOAL #6   Title Patient will  be independent with home program for strength and pain control for left UE/shoulder by 09/04/2014   Status On-going               Plan - 07/27/14 0930    Clinical Impression Statement Patient demonstrates improving strength and endurance in left UE with decreased pain. She conitnues with limited strength and ability to use left UE above shoulder level. She requires verbal cuing and assistance to perform exercises with good technique and through appropriate ROM.    Pt will benefit from skilled therapeutic intervention in order to improve on the following deficits Decreased strength;Decreased activity tolerance;Decreased endurance   Rehab Potential Fair   PT Frequency 2x / week   PT Duration 12 weeks   PT Treatment/Interventions Manual techniques;Cryotherapy;Therapeutic exercise;Patient/family education   PT Next Visit Plan Motor control/movement patterning, increase musculature strength.        Problem List There are no active problems to display for this patient.   Jomarie Longs PT 07/27/2014, 9:34 PM  [facility] [address], [place], [address] Phone: [phone]   Fax:  [phone]

## 2014-07-30 ENCOUNTER — Ambulatory Visit: Admitting: Physical Therapy

## 2014-07-30 DIAGNOSIS — M6281 Muscle weakness (generalized): Secondary | ICD-10-CM

## 2014-07-30 DIAGNOSIS — M25512 Pain in left shoulder: Secondary | ICD-10-CM | POA: Diagnosis not present

## 2014-07-30 DIAGNOSIS — G8929 Other chronic pain: Secondary | ICD-10-CM

## 2014-07-30 NOTE — Therapy (Signed)
[facility] [address], [place], [address] Phone: [phone]   Fax:  [phone]  Physical Therapy Treatment  Patient Details  Name: Suzanne Snyder MRN: [id number] Date of Birth: November 01, 1950 Referring Provider:  Henry Russel, MD  Encounter Date: 07/30/2014      PT End of Session - 07/30/14 1120    Visit Number 15   Number of Visits 24   Date for PT Re-Evaluation 09/04/14   PT Start Time 1050   PT Stop Time 1120   PT Time Calculation (min) 30 min   Activity Tolerance Patient tolerated treatment well   Behavior During Therapy [facility] for tasks assessed/performed      Past Medical History  Diagnosis Date  . Anxiety   . Diabetes mellitus without complication   . Osteoporosis     Past Surgical History  Procedure Laterality Date  . 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 Spine surgery N/A 1984    L4-5, not sure of procedure  . Rotator cuff repair Left 2014 and 2015    There were no vitals filed for this visit.  Visit Diagnosis:  Chronic shoulder pain, left  Muscle weakness of left upper extremity      Subjective Assessment - 07/30/14 1059    Subjective Patient reports she is doing better with less popping and grinding in her shoulder and is feeling stronger with left shoulder. Overall she is doing much better. She rpeorts intermittent pain in shoulder. She is exercising as instructed withless difficulty.    Patient Stated Goals Patient would like to be able to lift, reach with left UE for improved function with daily activities with less pain    Currently in Pain? Yes   Pain Score 2    Pain Location Shoulder   Pain Orientation Left   Pain Descriptors / Indicators Aching   Pain Type Chronic pain   Pain Onset More than a month ago   Pain Frequency Intermittent   Multiple Pain Sites No            [facility] Adult PT Treatment/Exercise - 07/30/14 1100    Exercises   Exercises Other  Exercises   Other Exercises  today: Omega cable resistive exercises: seated scapular row with 10# both UE's, 5# single arm with tactile and verbal cuing to assist through short arc up to 10 reps each,  isometric exercises in supine lying, forward elevation at 90 and 100 degrees, extension at side 2 x 10 reps, ER in plane of the scapula 2-3 sets x 10 reps mild to moderate resistance with verbal cuing, scapualr adduction 2 x 10 reps with manual resistance to engage lower trapezius muscle,  supine lying forward elevation with 2# weight overhead to ~ 90 degrees at shoulder, 1# and 2# ER off trunk, chest press with 1# weight and assistance of therapist to control motion     Patient response to treatment: Patient able to return demonstration with assistance and verbal cuing. Improved technique with repetition, mild popping palpable during exercises, less than previous sessions, increased intensity of exercises today without increased pain or popping in left shoulder, required guidance throughout all exercises for correct technique and to control ROM           PT Education - 07/30/14 1115    Education provided Yes   Education Details Re assessed home exercises and intensity to not aggravate popping in left shoulder, instructed in short arc motion  for scapular adduction and forward elevation to avoid pain/popping in left shoulder   Person(s) Educated Patient   Methods Explanation;Demonstration;Verbal cues   Comprehension Verbalized understanding;Returned demonstration;Verbal cues required             PT Long Term Goals - 07/20/14 0951    PT LONG TERM GOAL #1   Title Patient will improve AROM to 130 degrees left shoulder with mild discomfort for reaching into cabilint or performing hair care by 07/13/2014   Status Partially Met   PT LONG TERM GOAL #2   Title Patient will report pain level decrease from 8/10 to 5/10 with aggravating activities involving left UE/shoulder by 07/13/2014   Status  Achieved   PT LONG TERM GOAL #3   Title Patient will improve Quick Dash score to 30% or less by 07/27/2014 indicating improved functional use with left UE with less difficulty and pain   Status On-going   PT LONG TERM GOAL #4   Title Patient will improve strength in left UE shoulder stabilizers with noted improved posture and decreased winging of scapula and imporved overhead use of left UE by 08/17/2014   Status On-going   PT LONG TERM GOAL #5   Title Patient will improve quickDash score to 20% or less by 09/04/2014 indicating improved functional use with decreased pain in left shoulder   Status On-going   PT LONG TERM GOAL #6   Title Patient will  be independent with home program for strength and pain control for left UE/shoulder by 09/04/2014   Status On-going               Plan - 07/30/14 1120    Clinical Impression Statement Patient is able to perform exercises with minimal popping in left shoulder today with advanced intensity and return to using OMEGA cable exercises. She demonstrates good understanding of exercises and boundaries for strengthening. She will benefit from additional physical therapy intervention to achieve goals.    Pt will benefit from skilled therapeutic intervention in order to improve on the following deficits Decreased strength;Decreased activity tolerance;Decreased endurance   Rehab Potential Fair   PT Frequency 2x / week   PT Duration 12 weeks   PT Treatment/Interventions Manual techniques;Cryotherapy;Therapeutic exercise;Patient/family education   PT Next Visit Plan Motor control/movement patterning, increase musculature strength.        Problem List There are no active problems to display for this patient.   Jomarie Longs PT 07/30/2014, 9:16 PM  [facility] [address], [place], [address] Phone: [phone]   Fax:  [phone]

## 2014-07-31 ENCOUNTER — Encounter: Admitting: Physical Therapy

## 2014-08-01 ENCOUNTER — Ambulatory Visit: Admitting: Physical Therapy

## 2014-08-01 ENCOUNTER — Encounter: Payer: Self-pay | Admitting: Physical Therapy

## 2014-08-01 DIAGNOSIS — M25512 Pain in left shoulder: Secondary | ICD-10-CM | POA: Diagnosis not present

## 2014-08-01 DIAGNOSIS — G8929 Other chronic pain: Secondary | ICD-10-CM

## 2014-08-01 DIAGNOSIS — M6281 Muscle weakness (generalized): Secondary | ICD-10-CM

## 2014-08-01 NOTE — Therapy (Signed)
[facility] [address] [place], [address] Phone: [phone]   Fax:  [phone]  Physical Therapy Treatment  Patient Details  Name: Suzanne Snyder MRN: [id number] Date of Birth: Jun 02, 1950 Referring Provider:  Henry Russel, MD  Encounter Date: 08/01/2014      PT End of Session - 08/01/14 1106    Visit Number 16   Number of Visits 24   Date for PT Re-Evaluation 09/04/14   PT Start Time 1055   PT Stop Time 1135   PT Time Calculation (min) 40 min   Activity Tolerance Patient tolerated treatment well   Behavior During Therapy [facility] for tasks assessed/performed      Past Medical History  Diagnosis Date  . Anxiety   . Diabetes mellitus without complication   . Osteoporosis     Past Surgical History  Procedure Laterality Date  . 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 Spine surgery N/A 1984    L4-5, not sure of procedure  . Rotator cuff repair Left 2014 and 2015    There were no vitals filed for this visit.  Visit Diagnosis:  Chronic shoulder pain, left  Muscle weakness of left upper extremity      Subjective Assessment - 08/01/14 1100    Subjective Patient reports she is doing better with less popping and grinding in her shoulder and is feeling stronger with left shoulder. Overall she is doing much better. She rpeorts intermittent pain in shoulder. She is exercising as instructed withless difficulty.    Patient Stated Goals Patient would like to be able to lift, reach with left UE for improved function with daily activities with less pain    Currently in Pain? Yes   Pain Score 3    Pain Location Shoulder   Pain Orientation Left   Pain Descriptors / Indicators Aching   Pain Type Chronic pain   Pain Onset More than a month ago   Pain Frequency Intermittent   Multiple Pain Sites No      Objective: Treatment: Exercises: with assistance of therapist for manual resistance, verbal  and tactile cuing: seated scapular rows with holding end range contraction x 5 seconds with 10# both UE's and 5# single arm rows 3 sets 5 reps each, supine lying isometric forward elevation at 90 and 100 degrees 2 sets 10 reps, ER off trunk with 1# weight and 2# weight with guided motion through short arc and monitoring left shoulder for popping/grinding, chest press with 1# weight x 10 reps, standing body blade stabilization with small blade 2 x 30 seconds for push down in front and chest press  Patient response to treatment: fatigued with repetition, improved scapular stabilization with all exercises with verbal and tactile cuing required             PT Education - 08/01/14 1100    Education provided Yes   Education Details reviewed exercises and self management of exercises for left shoulder, verbal and tactile cues used during treatment for correct alignment of left shoulder and to facilitate corret muscle activation   Person(s) Educated Patient   Methods Explanation   Comprehension Verbalized understanding             PT Long Term Goals - 07/20/14 0951    PT LONG TERM GOAL #1   Title Patient will improve AROM to 130 degrees left shoulder with mild discomfort for reaching into cabilint or performing hair care  by 07/13/2014   Status Partially Met   PT LONG TERM GOAL #2   Title Patient will report pain level decrease from 8/10 to 5/10 with aggravating activities involving left UE/shoulder by 07/13/2014   Status Achieved   PT LONG TERM GOAL #3   Title Patient will improve Quick Dash score to 30% or less by 07/27/2014 indicating improved functional use with left UE with less difficulty and pain   Status On-going   PT LONG TERM GOAL #4   Title Patient will improve strength in left UE shoulder stabilizers with noted improved posture and decreased winging of scapula and imporved overhead use of left UE by 08/17/2014   Status On-going   PT LONG TERM GOAL #5   Title Patient will  improve quickDash score to 20% or less by 09/04/2014 indicating improved functional use with decreased pain in left shoulder   Status On-going   PT LONG TERM GOAL #6   Title Patient will  be independent with home program for strength and pain control for left UE/shoulder by 09/04/2014   Status On-going               Plan - 08/01/14 1200    Clinical Impression Statement Patient is progressing welll with imrpoving strength, motor control and decreased pain and popping in left shoulder during treatment sessions/exercise. She continues with weakness and will benefit from continued physical therapy intervention to address limitations.    Pt will benefit from skilled therapeutic intervention in order to improve on the following deficits Decreased strength;Decreased activity tolerance;Decreased endurance   Rehab Potential Fair   PT Frequency 2x / week   PT Duration 12 weeks   PT Treatment/Interventions Manual techniques;Cryotherapy;Therapeutic exercise;Patient/family education   PT Home Exercise Plan modified exercises to isometric and supine lying only, no weights through ROM        Problem List There are no active problems to display for this patient.   Jomarie Longs PT 08/01/2014, 10:28 PM  [facility] [address], [place], [address] Phone: [phone]   Fax:  [phone]

## 2014-08-03 ENCOUNTER — Encounter: Admitting: Physical Therapy

## 2014-08-08 ENCOUNTER — Encounter: Payer: Self-pay | Admitting: Physical Therapy

## 2014-08-08 ENCOUNTER — Ambulatory Visit: Admitting: Physical Therapy

## 2014-08-08 DIAGNOSIS — G8929 Other chronic pain: Secondary | ICD-10-CM

## 2014-08-08 DIAGNOSIS — M25512 Pain in left shoulder: Secondary | ICD-10-CM | POA: Diagnosis not present

## 2014-08-08 DIAGNOSIS — M6281 Muscle weakness (generalized): Secondary | ICD-10-CM

## 2014-08-08 NOTE — Therapy (Signed)
[facility] [address] [place], [address] Phone: [phone]   Fax:  [phone]  Physical Therapy Treatment  Patient Details  Name: Suzanne Snyder MRN: [id number] Date of Birth: [birth year] Referring Provider:  Henry Russel, MD  Encounter Date: 08/08/2014      PT End of Session - 08/08/14 0950    Visit Number 17   Number of Visits 24   Date for PT Re-Evaluation 09/04/14   PT Start Time 0905   PT Stop Time 0948   PT Time Calculation (min) 43 min   Activity Tolerance Patient tolerated treatment well   Behavior During Therapy [facility] for tasks assessed/performed      Past Medical History  Diagnosis Date  . Anxiety   . Diabetes mellitus without complication   . Osteoporosis     Past Surgical History  Procedure Laterality Date  . 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 Spine surgery N/A 1984    L4-5, not sure of procedure  . Rotator cuff repair Left 2014 and 2015    There were no vitals filed for this visit.  Visit Diagnosis:  Chronic shoulder pain, left  Muscle weakness of left upper extremity      Subjective Assessment - 08/08/14 0926    Subjective Patient reports she is doing better with less frequent popping and grinding in her shoulder continues feeling stronger in left shoulder. Overall she is doing much better.  She is exercising as instructed with less difficulty.    Limitations House hold activities   Patient Stated Goals Patient would like to be able to lift, reach with left UE for improved function with daily activities with less pain    Currently in Pain? Yes   Pain Score --  minor   Pain Location Shoulder   Pain Orientation Left   Pain Descriptors / Indicators Aching   Pain Type Chronic pain   Pain Onset More than a month ago   Multiple Pain Sites No           [facility] Adult PT Treatment/Exercise - 08/08/14 0928    Exercises   Exercises Other Exercises   Other  Exercises  sitting at OMEGA cable exercise machine: verbal cuing and tactile cuing to perform seated scapular row 10# both UE's 2  x 10, single arm row 5# each UE 2 sets, lat pull downs with 10# and then 5# with tactile cues and veralb cues for correct alingment and guiadance for correct # of repetitions when fatigued, chest press 5# at [facility] with cuing x 10 reps, supine exercises: forward elevation at 90 - 100 degrees 2 x 10 reps, singel arm chest press with 2# weight with contact guarding of left UE/suing for correct technnique x 10 reps, shoulder extension x 10 reps hold 5 seconds, ER off trunk with 2# dumbbell with assistance of therapist trough Snyder arc of motion for controlled movement, side lying upper trapezius release x 3 reps 20 seconds holds and lower trapezius strengthening with manual resistance x 10 reps      Patient response to treatment: verbalized understanding of exercises and role that scapula plays in order for to use left shoulder without pain/difficulty. Patient required verbal and tactile cuing throughout session to maintain good alignment of shoulder/scapula, control and form improved with repetition          PT Education - 08/08/14 0930    Education provided Yes   Education Details  stabilization for shoulder movement explained in more detail with tactile and verbal cuing when performing exercises involving deltoid and ER   Methods Explanation;Verbal cues;Tactile cues   Comprehension Verbalized understanding             PT Long Term Goals - 07/20/14 0951    PT LONG TERM GOAL #1   Title Patient will improve AROM to 130 degrees left shoulder with mild discomfort for reaching into cabilint or performing hair care by 07/13/2014   Status Partially Met   PT LONG TERM GOAL #2   Title Patient will report pain level decrease from 8/10 to 5/10 with aggravating activities involving left UE/shoulder by 07/13/2014   Status Achieved   PT LONG TERM GOAL #3   Title Patient  will improve Quick Dash score to 30% or less by 07/27/2014 indicating improved functional use with left UE with less difficulty and pain   Status On-going   PT LONG TERM GOAL #4   Title Patient will improve strength in left UE shoulder stabilizers with noted improved posture and decreased winging of scapula and imporved overhead use of left UE by 08/17/2014   Status On-going   PT LONG TERM GOAL #5   Title Patient will improve quickDash score to 20% or less by 09/04/2014 indicating improved functional use with decreased pain in left shoulder   Status On-going   PT LONG TERM GOAL #6   Title Patient will  be independent with home program for strength and pain control for left UE/shoulder by 09/04/2014   Status On-going               Plan - 08/08/14 0950    Clinical Impression Statement Patient demonstrated improved control of scapula throughout exercise today with verbal cuing and tactile cuing required to maintain correct alignment and to perform exercises within tolerance without increased fatigue. She is progressing well towards goals.    Pt will benefit from skilled therapeutic intervention in order to improve on the following deficits Decreased strength;Decreased activity tolerance;Decreased endurance   Rehab Potential Fair   PT Frequency 2x / week   PT Duration 12 weeks   PT Treatment/Interventions Manual techniques;Cryotherapy;Therapeutic exercise;Patient/family education   PT Next Visit Plan Motor control/movement patterning, increase musculature strength.        Problem List There are no active problems to display for this patient.   Jomarie Longs PT 08/08/2014, 12:26 PM  [facility] [address], [place], [address] Phone: [phone]   Fax:  [phone]

## 2014-08-09 ENCOUNTER — Ambulatory Visit: Admitting: Physical Therapy

## 2014-08-09 ENCOUNTER — Encounter: Payer: Self-pay | Admitting: Physical Therapy

## 2014-08-09 DIAGNOSIS — M6281 Muscle weakness (generalized): Secondary | ICD-10-CM

## 2014-08-09 DIAGNOSIS — M25512 Pain in left shoulder: Principal | ICD-10-CM

## 2014-08-09 DIAGNOSIS — G8929 Other chronic pain: Secondary | ICD-10-CM

## 2014-08-10 NOTE — Therapy (Signed)
[place] [facility] [address], [place], [address] Phone: [phone]   Fax:  [phone]  Physical Therapy Treatment  Patient Details  Name: Suzanne Snyder MRN: [id number] Date of Birth: [birth year] Referring Provider:  Henry Russel, MD  Encounter Date: 08/09/2014      PT End of Session - 08/09/14 1045    Visit Number 18   Number of Visits 24   Date for PT Re-Evaluation 09/04/14   PT Start Time 1003   PT Stop Time 1045   PT Time Calculation (min) 42 min   Activity Tolerance Patient tolerated treatment well   Behavior During Therapy [facility] for tasks assessed/performed      Past Medical History  Diagnosis Date  . Anxiety   . Diabetes mellitus without complication   . Osteoporosis     Past Surgical History  Procedure Laterality Date  . 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 Spine surgery N/A 1984    L4-5, not sure of procedure  . Rotator cuff repair Left 2014 and 2015    There were no vitals filed for this visit.  Visit Diagnosis:  Chronic shoulder pain, left  Muscle weakness of left upper extremity      Subjective Assessment - 08/09/14 1006    Subjective Patient reports she is doing much better in her left shoulder. She reports she was folding comforters last night and hurt her back. She is stiff in her lower back today.    Limitations House hold activities   Patient Stated Goals Patient would like to be able to lift, reach with left UE for improved function with daily activities with less pain    Currently in Pain? No/denies          [facility] Adult PT Treatment/Exercise - 08/09/14 1007    Exercises   Exercises Other Exercises   Other Exercises  sitting at [employer] cable exercise machine: verbal cuing and tactile cuing to perform seated scapular row 10# both UE's  x 10,  5# each UE 1 set, lat pull downs with 10#  cues for correct alingment and guidance for correct # of repetitions when  fatigued, chest press 5# at [facility] with cuing x 10 reps, supine exercises: forward elevation at 90 - 100 degrees 2 x 10 reps, single arm chest press with 2# weight with contact guarding of left UE/suing for correct technnique x 10 reps, shoulder extension x 10 reps hold 5 seconds, ER off trunk with manual resistance with assistance of therapist trough short arc of motion for controlled movement, side lying upper trapezius release x 3 reps 20 seconds holds and lower trapezius strengthening with manual resistance x 10 reps, body blade 2 sets x 30 seconds each for chest press and push down in front of trunk      Patient response to treatment: improving control and strength noted with exercises with verbal cuing           PT Education - 08/09/14 1045    Education provided Yes   Education Details instructed in body blade and stabilization of shoulder during exercises   Person(s) Educated Patient   Methods Explanation;Demonstration;Verbal cues   Comprehension Verbalized understanding;Returned demonstration;Verbal cues required             PT Long Term Goals - 07/20/14 0951    PT LONG TERM GOAL #1   Title Patient will improve AROM to 130 degrees left shoulder with mild discomfort  for reaching into cabilint or performing hair care by 07/13/2014   Status Partially Met   PT LONG TERM GOAL #2   Title Patient will report pain level decrease from 8/10 to 5/10 with aggravating activities involving left UE/shoulder by 07/13/2014   Status Achieved   PT LONG TERM GOAL #3   Title Patient will improve Quick Dash score to 30% or less by 07/27/2014 indicating improved functional use with left UE with less difficulty and pain   Status On-going   PT LONG TERM GOAL #4   Title Patient will improve strength in left UE shoulder stabilizers with noted improved posture and decreased winging of scapula and imporved overhead use of left UE by 08/17/2014   Status On-going   PT LONG TERM GOAL #5   Title Patient  will improve quickDash score to 20% or less by 09/04/2014 indicating improved functional use with decreased pain in left shoulder   Status On-going   PT LONG TERM GOAL #6   Title Patient will  be independent with home program for strength and pain control for left UE/shoulder by 09/04/2014   Status On-going               Problem List There are no active problems to display for this patient.   Jomarie Longs PT 08/10/2014, 7:17 AM  [place] [facility] [address] [place], [address] Phone: [phone]   Fax:  [phone]

## 2014-08-14 ENCOUNTER — Encounter: Payer: Self-pay | Admitting: Physical Therapy

## 2014-08-14 ENCOUNTER — Ambulatory Visit: Attending: Orthopedic Surgery | Admitting: Physical Therapy

## 2014-08-14 DIAGNOSIS — G8929 Other chronic pain: Secondary | ICD-10-CM | POA: Diagnosis present

## 2014-08-14 DIAGNOSIS — M6281 Muscle weakness (generalized): Secondary | ICD-10-CM | POA: Insufficient documentation

## 2014-08-14 DIAGNOSIS — M25512 Pain in left shoulder: Secondary | ICD-10-CM | POA: Insufficient documentation

## 2014-08-15 NOTE — Therapy (Signed)
[place] [facility] [address] [place], [address] Phone: [phone]   Fax:  [phone]  Physical Therapy Treatment  Patient Details  Name: Suzanne Snyder MRN: [id number] Date of Birth: 03/20/1950 Referring Provider:  Henry Russel, MD  Encounter Date: 08/14/2014      PT End of Session - 08/14/14 1200    Visit Number 19   Number of Visits 24   Date for PT Re-Evaluation 09/04/14   PT Start Time 1121   PT Stop Time 1200   PT Time Calculation (min) 39 min   Activity Tolerance Patient tolerated treatment well   Behavior During Therapy [facility] for tasks assessed/performed      Past Medical History  Diagnosis Date  . Anxiety   . Diabetes mellitus without complication   . Osteoporosis     Past Surgical History  Procedure Laterality Date  . 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 Spine surgery N/A 1984    L4-5, not sure of procedure  . Rotator cuff repair Left 2014 and 2015    There were no vitals filed for this visit.  Visit Diagnosis:  Chronic shoulder pain, left  Muscle weakness of left upper extremity           SUBJECTIVE: Patient reports she is doing well, tired today, continues to see improvement with therapy treatment/interventions    PAIN : 2/10 primarily soreness reported  OBJECTIVE: Treatment: Exercises  In supine lying: isometric scapular adduction x 10 with manual resistance, rhythmic stabilization in forward elevation at 90 degrees, neutral IR/ER at side x 10 reps each, ER off trunk with 2# weight and assistance of therapist Side lying ER off trunk    Pt response for medical necessity:improved control with stabilization of shoulder without increased pain, required assistance to perform with good technique        PT Education - 08/14/14 1200    Education provided Yes   Education Details re assessed home exercises   Person(s) Educated Patient   Methods Explanation   Comprehension Verbalized understanding             PT Long Term Goals - 07/20/14 0951    PT LONG TERM GOAL #1   Title Patient will improve AROM to 130 degrees left shoulder with mild discomfort for reaching into cabilint or performing hair care by 07/13/2014   Status Partially Met   PT LONG TERM GOAL #2   Title Patient will report pain level decrease from 8/10 to 5/10 with aggravating activities involving left UE/shoulder by 07/13/2014   Status Achieved   PT LONG TERM GOAL #3   Title Patient will improve Quick Dash score to 30% or less by 07/27/2014 indicating improved functional use with left UE with less difficulty and pain   Status On-going   PT LONG TERM GOAL #4   Title Patient will improve strength in left UE shoulder stabilizers with noted improved posture and decreased winging of scapula and imporved overhead use of left UE by 08/17/2014   Status On-going   PT LONG TERM GOAL #5   Title Patient will improve quickDash score to 20% or less by 09/04/2014 indicating improved functional use with decreased pain in left shoulder   Status On-going   PT LONG TERM GOAL #6   Title Patient will  be independent with home program for strength and pain control for left UE/shoulder by 09/04/2014   Status On-going  Plan - 08/14/14 1201    Clinical Impression Statement Patient is progressing well with all goals. improved control of scpaula throughout exercises today.    Pt will benefit from skilled therapeutic intervention in order to improve on the following deficits Decreased strength;Decreased activity tolerance;Decreased endurance   Rehab Potential Fair   PT Frequency 2x / week   PT Duration 12 weeks   PT Treatment/Interventions Manual techniques;Cryotherapy;Therapeutic exercise;Patient/family education        Problem List There are no active problems to display for this patient.   Jomarie Longs PT 08/15/2014, 7:38 PM  [place] [facility] [address], [place], [address] Phone: [phone]   Fax:  [phone]

## 2014-08-21 ENCOUNTER — Ambulatory Visit: Admitting: Physical Therapy

## 2014-08-21 ENCOUNTER — Encounter: Payer: Self-pay | Admitting: Physical Therapy

## 2014-08-21 DIAGNOSIS — M6281 Muscle weakness (generalized): Secondary | ICD-10-CM

## 2014-08-21 DIAGNOSIS — M25512 Pain in left shoulder: Secondary | ICD-10-CM | POA: Diagnosis not present

## 2014-08-21 DIAGNOSIS — G8929 Other chronic pain: Secondary | ICD-10-CM

## 2014-08-21 NOTE — Therapy (Signed)
[place] [facility] [address] [place], [address] Phone: [phone]   Fax:  [phone]  Physical Therapy Treatment  Patient Details  Name: Suzanne MARSIGLIA MRN: [id number] Date of Birth: 12-28-1950 Referring Provider:  Henry Russel, MD  Encounter Date: 08/21/2014      PT End of Session - 08/21/14 1110    Visit Number 20   Number of Visits 24   Date for PT Re-Evaluation 09/04/14   PT Start Time 1950   PT Stop Time 1110   PT Time Calculation (min) 35 min   Activity Tolerance Patient tolerated treatment well   Behavior During Therapy [facility] for tasks assessed/performed      Past Medical History  Diagnosis Date  . Anxiety   . Diabetes mellitus without complication   . Osteoporosis     Past Surgical History  Procedure Laterality Date  . 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 Spine surgery N/A 1984    L4-5, not sure of procedure  . Rotator cuff repair Left 2014 and 2015    There were no vitals filed for this visit.  Visit Diagnosis:  Chronic shoulder pain, left  Muscle weakness of left upper extremity      Subjective Assessment - 08/21/14 1034    Subjective Patient report she is continuing to see improvement with left shoulder. She reports she is having less popping in her shoulder and now feels rubbing sensation more than popping.    Patient Stated Goals Patient would like to be able to lift, reach with left UE for improved function with daily activities with less pain    Currently in Pain? Yes   Pain Score 3    Pain Location Shoulder   Pain Orientation Left   Pain Descriptors / Indicators Aching   Pain Type Chronic pain   Pain Onset More than a month ago   Pain Frequency Intermittent            [facility] Adult PT Treatment/Exercise - 08/21/14 1037    Exercises   Exercises Other Exercises   Other Exercises  sitting at [employer] cable exercise machine: verbal cuing and tactile cuing to  perform seated scapular row 10# both UE's 2  x 10, single arm row 5# each UE 2 sets, lat pull downs with 10#  tactile cues and veralb cues for correct alingment and guidance for correct alignment of shoulder, chest press with manual resistance x 10 reps, wall push ups partial ROM with demonstration and verbal cuing,  supine exercises: forward elevation at 90 - 100 degrees 2 x 10 reps, ER off trunk with 2# dumbbell with assistance of therapist through short arc of motion for controlled movement, side lying upper trapezius release x 3 reps 20 seconds holds and lower trapezius strengthening with manual resistance x 10 reps     Patient response to treatment: no pain reported throughout session, required verbal cuing and tactile cuing to complete exercises           PT Education - 08/21/14 1100    Education provided Yes   Education Details Reassessed home program for strengthening and motor control for scapula and left shoulder   Person(s) Educated Patient   Methods Explanation;Demonstration;Verbal cues   Comprehension Verbalized understanding;Returned demonstration;Verbal cues required             PT Long Term Goals - 07/20/14 0951    PT LONG TERM GOAL #1   Title  Patient will improve AROM to 130 degrees left shoulder with mild discomfort for reaching into cabilint or performing hair care by 07/13/2014   Status Partially Met   PT LONG TERM GOAL #2   Title Patient will report pain level decrease from 8/10 to 5/10 with aggravating activities involving left UE/shoulder by 07/13/2014   Status Achieved   PT LONG TERM GOAL #3   Title Patient will improve Quick Dash score to 30% or less by 07/27/2014 indicating improved functional use with left UE with less difficulty and pain   Status On-going   PT LONG TERM GOAL #4   Title Patient will improve strength in left UE shoulder stabilizers with noted improved posture and decreased winging of scapula and imporved overhead use of left UE by 08/17/2014    Status On-going   PT LONG TERM GOAL #5   Title Patient will improve quickDash score to 20% or less by 09/04/2014 indicating improved functional use with decreased pain in left shoulder   Status On-going   PT LONG TERM GOAL #6   Title Patient will  be independent with home program for strength and pain control for left UE/shoulder by 09/04/2014   Status On-going               Plan - 08/21/14 1110    Clinical Impression Statement Patient continues to progress well towards goals. Improved control noted in left shoulder with exercises. She continues with weakness and will benefit from additional exercises to achieve goals.    Pt will benefit from skilled therapeutic intervention in order to improve on the following deficits Decreased strength;Decreased activity tolerance;Decreased endurance   Rehab Potential Fair   PT Frequency 2x / week   PT Duration 12 weeks   PT Treatment/Interventions Manual techniques;Cryotherapy;Therapeutic exercise;Patient/family education   PT Next Visit Plan Motor control/movement patterning, increase musculature strength.        Problem List There are no active problems to display for this patient.   Jomarie Longs PT 08/21/2014, 4:07 PM  [place] [facility] [address] [place], [address] Phone: [phone]   Fax:  [phone]

## 2014-08-28 ENCOUNTER — Ambulatory Visit: Admitting: Physical Therapy

## 2014-08-28 DIAGNOSIS — M25512 Pain in left shoulder: Principal | ICD-10-CM

## 2014-08-28 DIAGNOSIS — G8929 Other chronic pain: Secondary | ICD-10-CM

## 2014-08-28 DIAGNOSIS — M6281 Muscle weakness (generalized): Secondary | ICD-10-CM

## 2014-08-29 NOTE — Therapy (Signed)
[place] [facility] [address], [place], [address] Phone: [phone]   Fax:  [phone]  Physical Therapy Treatment  Patient Details  Name: Suzanne Snyder MRN: [id number] Date of Birth: April 15, 1950 Referring Provider:  Henry Russel, MD  Encounter Date: 08/28/2014      PT End of Session - 08/28/14 1058    Visit Number 21   Number of Visits 24   Date for PT Re-Evaluation 09/04/14   PT Start Time 0930   PT Stop Time 0955   PT Time Calculation (min) 25 min   Activity Tolerance Patient tolerated treatment well   Behavior During Therapy [facility] for tasks assessed/performed      Past Medical History  Diagnosis Date  . Anxiety   . Diabetes mellitus without complication   . Osteoporosis     Past Surgical History  Procedure Laterality Date  . 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 Spine surgery N/A 1984    L4-5, not sure of procedure  . Rotator cuff repair Left 2014 and 2015    There were no vitals filed for this visit.  Visit Diagnosis:  Chronic shoulder pain, left  Muscle weakness of left upper extremity      Subjective Assessment - 08/28/14 0930    Subjective Patient report she is continuing to see improvement with left shoulder. She reports she is having less popping in her shoulder and now feels rubbing sensation more than popping. She reports she is more sore in left shoulder today. Pain level 2/10 left shoulder             [facility] Adult PT Treatment/Exercise - 08/28/14 0944    Exercises   Exercises Other Exercises   Other Exercises  sitting at [employer] cable exercise machine: verbal cuing and tactile cuing to perform seated scapular row 10# both UE's 2  x 10, single arm row 5# each UE 2 sets, lat pull downs with 10# - 15# with tactile cues and verbal cues for correct alingment and guiadance for correct # of repetitions when fatigued, shoulder extension x 10 reps hold 5 seconds, ER off trunk  with assistance of therapist through short arc of motion for controlled movement, side lying upper trapezius release x 3 reps 20 seconds holds and lower trapezius strengthening with manual resistance x 10 reps with tactile cuing to engage correct muscles.  Supine lying holding 5# weight with spotting of therapist x 10 reps, 3# weight x 10 reps with verbal cuing and guided motion      Patient response to treatment: improved technique with exercises with repetition and verbal and tactile cuing for correct alignment of shoulder          PT Education - 08/28/14 1000    Education provided Yes   Education Details educated in proper modificaiton of exercises when she is feeling increased grinding/popping in left shoulder: decreased to isometrics and short arc motion   Person(s) Educated Patient   Methods Explanation;Demonstration;Verbal cues   Comprehension Verbalized understanding;Returned demonstration;Verbal cues required             PT Long Term Goals - 07/20/14 0951    PT LONG TERM GOAL #1   Title Patient will improve AROM to 130 degrees left shoulder with mild discomfort for reaching into cabilint or performing hair care by 07/13/2014   Status Partially Met   PT LONG TERM GOAL #2   Title Patient will report pain level  decrease from 8/10 to 5/10 with aggravating activities involving left UE/shoulder by 07/13/2014   Status Achieved   PT LONG TERM GOAL #3   Title Patient will improve Quick Dash score to 30% or less by 07/27/2014 indicating improved functional use with left UE with less difficulty and pain   Status On-going   PT LONG TERM GOAL #4   Title Patient will improve strength in left UE shoulder stabilizers with noted improved posture and decreased winging of scapula and imporved overhead use of left UE by 08/17/2014   Status On-going   PT LONG TERM GOAL #5   Title Patient will improve quickDash score to 20% or less by 09/04/2014 indicating improved functional use with  decreased pain in left shoulder   Status On-going   PT LONG TERM GOAL #6   Title Patient will  be independent with home program for strength and pain control for left UE/shoulder by 09/04/2014   Status On-going               Plan - 08/28/14 0958    Clinical Impression Statement Patient is progressing well towards goals and demonstrates improving strength with all exercises and improving use of left UE with daily activities. She requires cuing for correct alignment of left shoulder during exercises and guidance for correct modification of exercises to avoid increased pain in left shoulder.    Pt will benefit from skilled therapeutic intervention in order to improve on the following deficits Decreased strength;Decreased activity tolerance;Decreased endurance   Rehab Potential Fair   PT Frequency 2x / week   PT Duration 12 weeks   PT Treatment/Interventions Manual techniques;Cryotherapy;Therapeutic exercise;Patient/family education   PT Next Visit Plan Motor control/movement patterning, increase musculature strength.        Problem List There are no active problems to display for this patient.   Jomarie Longs PT 08/29/2014, 1:39 PM  [place] [facility] [address], [place], [address] Phone: [phone]   Fax:  [phone]

## 2014-09-04 ENCOUNTER — Ambulatory Visit: Admitting: Physical Therapy

## 2014-09-05 ENCOUNTER — Ambulatory Visit: Admitting: Physical Therapy

## 2014-09-11 ENCOUNTER — Encounter: Admitting: Physical Therapy

## 2014-09-11 ENCOUNTER — Encounter: Payer: Self-pay | Admitting: Physical Therapy

## 2014-09-11 ENCOUNTER — Ambulatory Visit: Admitting: Physical Therapy

## 2014-09-11 DIAGNOSIS — G8929 Other chronic pain: Secondary | ICD-10-CM

## 2014-09-11 DIAGNOSIS — M25512 Pain in left shoulder: Principal | ICD-10-CM

## 2014-09-11 DIAGNOSIS — M6281 Muscle weakness (generalized): Secondary | ICD-10-CM

## 2014-09-12 NOTE — Therapy (Signed)
[facility] [address] [place], [address] Phone: [phone]   Fax:  [phone]  Physical Therapy Treatment  Patient Details  Name: Suzanne Snyder MRN: [id number] Date of Birth: [birth year] Referring Provider:  Carin Primrose, MD  Encounter Date: 09/11/2014      PT End of Session - 09/11/14 2253    Visit Number 22   Number of Visits 32   Date for PT Re-Evaluation 11/20/14   PT Start Time 1108   PT Stop Time 1140   PT Time Calculation (min) 32 min   Activity Tolerance Patient tolerated treatment well   Behavior During Therapy [facility] for tasks assessed/performed      Past Medical History  Diagnosis Date  . Anxiety   . Diabetes mellitus without complication   . Osteoporosis     Past Surgical History  Procedure Laterality Date  . 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 Spine surgery N/A 1984    L4-5, not sure of procedure  . Rotator cuff repair Left 2014 and 2015    There were no vitals filed for this visit.  Visit Diagnosis:  Chronic shoulder pain, left - Plan: PT plan of care cert/re-cert  Muscle weakness of left upper extremity - Plan: PT plan of care cert/re-cert      Subjective Assessment - 09/11/14 1108    Subjective Patient reports she is having intermittent grinding in left shoulder when shoulder is in "the wrong position". She is still feeling that she is improving at the time.    Limitations House hold activities   Patient Stated Goals Patient would like to be able to lift, reach with left UE for improved function with daily activities with less pain    Currently in Pain? Yes   Pain Score 3    Pain Location Shoulder   Pain Orientation Left   Pain Descriptors / Indicators Aching   Pain Type Chronic pain   Pain Onset More than a month ago   Pain Frequency Intermittent      Objective: Palpation: + spasms palpable along left upper trapezius and into cervical spine,  increased muscle bulk noted in left shoulder infraspinatus, teres muscles and lower trapezius muscles during exercises Strength: improving forward elevation at 90 degrees, and ER/IR at neutral position: able to tolerate moderate+ resistance during exercises instead of light resistance       [facility] Adult PT Treatment/Exercise - 09/11/14 1114    Exercises   Exercises Other Exercises   Other Exercises  sitting at [employer] cable exercise machine: verbal cuing and tactile cuing to perform seated scapular row 10# both UE's 2  x 10,  lat pull downs with 10# with tactile cues and verbal cues for correct alignment and guidance; supine exercises: forward elevation at 90 - 100 degrees 3 x 10 reps, ER/IR holding exercises with manual resistance 3 x 10 reps, single arm triceps extension with 2# weight with contact guarding of left UE/cuing for correct technnique x 10 reps, strengthening with manual resistance x 10 reps       Patient response to treatment: Patient required verbal and tactile cues for correct shoulder alignment and to engage appropriate muscles during exercises, patient demonstrated decreased spasms in upper trapezius and along left side cervical spine           PT Education - 09/11/14 1145    Education provided Yes   Education Details re inforced home program and required  verbal and tactile cues for correct alignment of left shoulder during session   Person(s) Educated Patient   Methods Explanation;Tactile cues;Verbal cues   Comprehension Verbalized understanding;Returned demonstration;Verbal cues required;Tactile cues required             PT Long Term Goals - 09/11/14 1143    PT LONG TERM GOAL #1   Title Patient will improve AROM to 130 degrees left shoulder with mild discomfort for reaching into cabinet or performing hair care by 10/20/2014   Baseline AROM overhead with increased pain and decreased strength   Status On-going   PT LONG TERM GOAL #2   Title Patient will improve  Quick Dash score to 30% or less by 07/27/2014 indicating improved functional use with left UE with less difficulty and pain   Baseline initially 42% to 50% impairment   Status Achieved   PT LONG TERM GOAL #3   Title Patient will demonstrate improved strength to 4/5 for left UE forward elevation, ER left UE in order to improve function with overhead tasks by 11/20/2014   Baseline decreased strength (~3/5) for forward elevation/ER for using left UE overhead and shoulder level activties without difficulty   Status Revised   PT LONG TERM GOAL #4   Title Patient will improve quickDash score to 20% or less by 10/20/2014 indicating improved functional use with decreased pain in left shoulder   Baseline initial 42% to 50% impairment   Status Revised   PT LONG TERM GOAL #5   Title Patient will  be independent with home program for strength and pain control for left UE/shoulder by 11/20/2014   Baseline limited knowledge of pain control strategies or progression of exercises for left UE   Status Revised                           Plan - 09/11/14 1145    Clinical Impression Statement Patient is progressing well towards goals and demonstrates improving strength with all exercises and improvment with scapular control. She continues with decreased strength in left shoulder and UE and will require additional physical therapy intervention in order to improve functional use of left UE.    Pt will benefit from skilled therapeutic intervention in order to improve on the following deficits Decreased strength;Decreased activity tolerance;Decreased endurance   Rehab Potential Fair   Clinical Impairments Affecting Rehab Potential multiple surgeries left shoulder for rotator cuff tears without complete return to full function following either surgery   PT Frequency 2x / week   PT Duration 12 weeks   PT Treatment/Interventions Manual techniques;Cryotherapy;Therapeutic exercise;Patient/family education   PT Next  Visit Plan Motor control/movement patterning, increase musculature strength.        Problem List There are no active problems to display for this patient.   Beacher May PT 09/12/2014, 2:36 PM  [facility] PH[facility]ICAL AND SPORTS MEDICINE [address] [place], [address] Phone: [phone]   Fax:  [phone]

## 2014-09-18 ENCOUNTER — Ambulatory Visit: Attending: Orthopedic Surgery | Admitting: Physical Therapy

## 2014-09-18 ENCOUNTER — Encounter: Payer: Self-pay | Admitting: Physical Therapy

## 2014-09-18 DIAGNOSIS — M25512 Pain in left shoulder: Secondary | ICD-10-CM | POA: Insufficient documentation

## 2014-09-18 DIAGNOSIS — G8929 Other chronic pain: Secondary | ICD-10-CM | POA: Insufficient documentation

## 2014-09-18 DIAGNOSIS — M6281 Muscle weakness (generalized): Secondary | ICD-10-CM | POA: Diagnosis present

## 2014-09-19 NOTE — Therapy (Signed)
[facility] [address] [place], [address] Phone: [phone]   Fax:  [phone]  Physical Therapy Treatment  Patient Details  Name: Suzanne Snyder MRN: [id number] Date of Birth: 1950-03-05 Referring Provider:  Carin Primrose, MD  Encounter Date: 09/18/2014      PT End of Session - 09/18/14 1140    Visit Number 23   Number of Visits 32   Date for PT Re-Evaluation 11/20/14   PT Start Time 1100   PT Stop Time 1140   PT Time Calculation (min) 40 min   Activity Tolerance Patient limited by fatigue   Behavior During Therapy [facility] for tasks assessed/performed      Past Medical History  Diagnosis Date  . Anxiety   . Diabetes mellitus without complication   . Osteoporosis     Past Surgical History  Procedure Laterality Date  . 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 Spine surgery N/A 1984    L4-5, not sure of procedure  . Rotator cuff repair Left 2014 and 2015    There were no vitals filed for this visit.  Visit Diagnosis:  Chronic shoulder pain, left  Muscle weakness of left upper extremity      Subjective Assessment - 09/18/14 1105    Subjective did a lot over the weekend   Currently in Pain? Yes   Pain Score 3    Pain Location Shoulder   Pain Orientation Left   Pain Descriptors / Indicators Aching   Pain Type Chronic pain   Pain Onset More than a month ago     Objective: Palpation; + spasms along left side cervical spine/upper trapezius with + grinding palpable scapulothoracic joint with exercises        [facility] Adult PT Treatment/Exercise - 09/18/14 1106    Exercises   Exercises Other Exercises   Other Exercises  sitting at [employer] cable exercise machine: verbal cuing and tactile cuing to perform seated scapular row 10# both UE's 1 x 10, lat pull downs with 10#  with tactile cues and verbal cues for correct alingment and guidance for correct # of repetitions when fatigued,  supine exercises: forward elevation at 90 - 100 degrees 2 x 10 reps, single arm chest press with 2# weight with contact guarding of left UE/suing for correct technnique x 10 reps, ER off trunk with 2# dumbbell with assistance of therapist through short arc of motion for controlled movement, side lying upper trapezius release x 3 reps 10 seconds holds and lower trapezius strengthening with manual resistance x 5 reps     Manual therapy: STM performed along left side cervical spine paraspinal muscles and upper trapezius prior to exercise with patient supine lying  Patient response to treatment: improved soft tissue elasticity and decreased pain in left shoulder, required modification of exercises to allow patient to be able to complete exercises with minimal difficulty, required tactile and verbal cuing to perform all exercises and monitor for grinding and proper alignment of shoulder           PT Education - 09/18/14 1130    Education provided Yes   Education Details Instructed to rest today and not do a whole lot with left UE due to increased grinding/pain today. resume exercises in 2-3 days   Person(s) Educated Patient   Methods Explanation   Comprehension Verbalized understanding             PT Long Term Goals -  09/11/14 1143    PT LONG TERM GOAL #1   Title Patient will improve AROM to 130 degrees left shoulder with mild discomfort for reaching into cabinet or performing hair care by 10/20/2014   Baseline AROM overhead with increased pain and decreased strength   Status On-going   PT LONG TERM GOAL #2   Title Patient will improve Quick Dash score to 30% or less by 07/27/2014 indicating improved functional use with left UE with less difficulty and pain   Baseline initially 42% to 50% impairment   Status Achieved   PT LONG TERM GOAL #3   Title Patient will demonstrate improved strength to 4/5 for left UE forward elevation, ER left UE in order to improve function with overhead  tasks by 11/20/2014   Baseline decreased strength (~3/5) for forward elevation/ER for using left UE overhead and shoulder level activties without difficulty   Status Revised   PT LONG TERM GOAL #4   Title Patient will improve quickDash score to 20% or less by 10/20/2014 indicating improved functional use with decreased pain in left shoulder   Baseline initial 42% to 50% impairment   Status Revised   PT LONG TERM GOAL #5   Title Patient will  be independent with home program for strength and pain control for left UE/shoulder by 11/20/2014   Baseline limited knowledge of pain control strategies or progression of exercises for left UE   Status Revised   PT LONG TERM GOAL #6   Title --   Baseline --   Status --               Plan - 09/18/14 1149    Clinical Impression Statement Patient demonstrates improved strenght and motor control with exercises. She is limited by fatigue today due to using left UE quite a bit over the weekend for household and cooking tasks. she verbalized understanding of the need to rest to recuperate and regain her strength and endurance prior to increased activity/exercise.    Pt will benefit from skilled therapeutic intervention in order to improve on the following deficits Decreased strength;Decreased activity tolerance;Decreased endurance   Rehab Potential Fair   PT Frequency 2x / week   PT Duration 12 weeks   PT Treatment/Interventions Manual techniques;Cryotherapy;Therapeutic exercise;Patient/family education   PT Next Visit Plan Motor control/movement patterning, increase musculature strength.        Problem List There are no active problems to display for this patient.   Beacher May PT 09/19/2014, 7:52 AM  [facility] PHYS[facility] AND SPORTS MEDICINE [address], [place], [address] Phone: [phone]   Fax:  [phone]

## 2014-09-25 ENCOUNTER — Ambulatory Visit: Admitting: Physical Therapy

## 2014-10-02 ENCOUNTER — Ambulatory Visit: Admitting: Physical Therapy

## 2014-10-02 ENCOUNTER — Encounter: Payer: Self-pay | Admitting: Physical Therapy

## 2014-10-02 DIAGNOSIS — M6281 Muscle weakness (generalized): Secondary | ICD-10-CM

## 2014-10-02 DIAGNOSIS — M25512 Pain in left shoulder: Principal | ICD-10-CM

## 2014-10-02 DIAGNOSIS — G8929 Other chronic pain: Secondary | ICD-10-CM

## 2014-10-02 NOTE — Therapy (Signed)
[facility]TS [facility] [address] [place], [address] Phone: [phone]   Fax:  [phone]  Physical Therapy Treatment  Patient Details  Name: Suzanne Snyder MRN: [id number] Date of Birth: 04-19-1950 Referring Provider:  Carin Primrose, MD  Encounter Date: 10/02/2014      PT End of Session - 10/02/14 1113    Visit Number 24   Number of Visits 32   Date for PT Re-Evaluation 11/20/14   PT Start Time 1111   PT Stop Time 1200   PT Time Calculation (min) 49 min   Activity Tolerance Patient limited by fatigue   Behavior During Therapy [facility] for tasks assessed/performed      Past Medical History  Diagnosis Date  . Anxiety   . Diabetes mellitus without complication   . Osteoporosis     Past Surgical History  Procedure Laterality Date  . 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 Spine surgery N/A 1984    L4-5, not sure of procedure  . Rotator cuff repair Left 2014 and 2015    There were no vitals filed for this visit.  Visit Diagnosis:  Chronic shoulder pain, left  Muscle weakness of left upper extremity      Subjective Assessment - 10/02/14 1124    Subjective Patient reports she has had difficulty with controlling her glucose levels over the past week and is feeling tired and not able to exercise at times. Today her level is 90 on arrival (tested in car prior to entering clinic). She is noticing intermittent rubbing of shoulder joint and occasional popping in shoulder now, improving with exercises guided in therapy session.  She reports that over the past week she was able to raise her arm overhead for the first time without thinking about it.   Limitations House hold activities   Patient Stated Goals Patient would like to be able to lift, reach with left UE for improved function with daily activities with less pain      AROM: left shoulder forward elevation 150 degrees, ER and IR in supine lying  WNL's, limited abduction in sitting/standing to 120 degrees Strength: left shoulder with isometric testing mid range: strong and non painful without grinding in joint noted, improving scapular stabilization noted during exercises, fatigues with repetition of all exercises within 5-10 reps      [facility] Adult PT Treatment/Exercise - 10/02/14 1112    Exercises   Exercises Other Exercises   Other Exercises  sitting at [employer] cable exercise machine: verbal cuing and tactile cuing to perform seated scapular row 10# both UE's 2  x 10,  Reverse chin ups and lat pull downs in sitting 2 x 15 reps each 15#, with tactile cues and verbal cues for correct alignment and guidance for correct # of repetitions when fatigued, supine exercises: rhythmic stabilization/isometrics with forward elevation at 90 - 100 degrees 2 x 10 reps, single arm chest press with 2# weight with contact guarding of left UE/cuing for correct technnique x 10 reps, ER off trunk with 2# dumbbell with assistance of therapist through short arc of motion for controlled movement, 3# weight held in both hands for forward elevation through short arc overhead with guidance at end points for patient cues to avoid pain x 5 reps        Patient response to treatment: patient required modification of exercises to allow patient to be able to complete exercises with minimal difficulty, required tactile and  verbal cuing to perform all exercises and monitor for rubbing sensation in left shoulder and to ensure proper alignment of shoulder, palpable grinding of GHJ noted when patient became fatigued or with too much weight         PT Long Term Goals - 09/11/14 1143    PT LONG TERM GOAL #1   Title Patient will improve AROM to 130 degrees left shoulder with mild discomfort for reaching into cabinet or performing hair care by 10/20/2014   Baseline AROM overhead with increased pain and decreased strength   Status On-going   PT LONG TERM GOAL #2   Title Patient  will improve Quick Dash score to 30% or less by 07/27/2014 indicating improved functional use with left UE with less difficulty and pain   Baseline initially 42% to 50% impairment   Status Achieved   PT LONG TERM GOAL #3   Title Patient will demonstrate improved strength to 4/5 for left UE forward elevation, ER left UE in order to improve function with overhead tasks by 11/20/2014   Baseline decreased strength (~3/5) for forward elevation/ER for using left UE overhead and shoulder level activties without difficulty   Status Revised   PT LONG TERM GOAL #4   Title Patient will improve quickDash score to 20% or less by 10/20/2014 indicating improved functional use with decreased pain in left shoulder   Baseline initial 42% to 50% impairment   Status Revised   PT LONG TERM GOAL #5   Title Patient will  be independent with home program for strength and pain control for left UE/shoulder by 11/20/2014   Baseline limited knowledge of pain control strategies or progression of exercises for left UE   Status Revised   PT LONG TERM GOAL #6   Title --   Baseline --   Status --               Plan - 10/02/14 1115    Clinical Impression Statement Patient is improving with strength and with decreasing pain in left shoulder. She continues with weaknes and limitation by glucose levels being unpredictable. She requires guidance to perform exercises with correct posture and to engage correct muscles and perform with appropriate intensity to not cause pain, grinding in joints.    Pt will benefit from skilled therapeutic intervention in order to improve on the following deficits Decreased strength;Decreased activity tolerance;Decreased endurance   Rehab Potential Fair   Clinical Impairments Affecting Rehab Potential multiple surgeries left shoulder for rotator cuff tears without complete return to full function following either surgery   PT Frequency 2x / week   PT Duration 12 weeks   PT  Treatment/Interventions Manual techniques;Cryotherapy;Therapeutic exercise;Patient/family education   PT Next Visit Plan Motor control/movement patterning, increase musculature strength.        Problem List There are no active problems to display for this patient.   Carl Best 10/02/2014, 12:55 PM  [facility] PHYSICAL AND SPORTS MEDICINE [address] [place], [address] Phone: [phone]   Fax:  [phone]

## 2014-10-09 ENCOUNTER — Encounter: Admitting: Physical Therapy

## 2014-10-10 ENCOUNTER — Ambulatory Visit: Admitting: Physical Therapy

## 2014-10-10 ENCOUNTER — Encounter: Payer: Self-pay | Admitting: Physical Therapy

## 2014-10-10 DIAGNOSIS — G8929 Other chronic pain: Secondary | ICD-10-CM

## 2014-10-10 DIAGNOSIS — M25512 Pain in left shoulder: Secondary | ICD-10-CM | POA: Diagnosis not present

## 2014-10-10 DIAGNOSIS — M6281 Muscle weakness (generalized): Secondary | ICD-10-CM

## 2014-10-10 NOTE — Therapy (Signed)
[facility] PHYSICAL [facility] SPORTS MEDICINE [address] [place], [address] Phone: [phone]   Fax:  [phone]  Physical Therapy Treatment  Patient Details  Name: ELLYANNA Snyder MRN: [id number] Date of Birth: 02/08/1950 Referring Azjah Pardo:  Carin Primrose, MD  Encounter Date: 10/10/2014      PT End of Session - 10/10/14 1213    Visit Number 25   Number of Visits 32   Date for PT Re-Evaluation 11/20/14   PT Start Time 1115   PT Stop Time 1150   PT Time Calculation (min) 35 min   Activity Tolerance Patient limited by fatigue   Behavior During Therapy [facility] for tasks assessed/performed      Past Medical History  Diagnosis Date  . Anxiety   . Diabetes mellitus without complication   . Osteoporosis     Past Surgical History  Procedure Laterality Date  . 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 Spine surgery N/A 1984    L4-5, not sure of procedure  . Rotator cuff repair Left 2014 and 2015    There were no vitals filed for this visit.  Visit Diagnosis:  Chronic shoulder pain, left  Muscle weakness of left upper extremity      Subjective Assessment - 10/10/14 1124    Subjective Patient reports she has had increased soreness in shoulder and has just not been feeling well in the past day or so. She has had spikes and lows with glucose levels.    Patient Stated Goals Patient would like to be able to lift, reach with left UE for improved function with daily activities with less pain    Currently in Pain? Yes   Pain Score 4    Pain Location Shoulder   Pain Orientation Left   Pain Descriptors / Indicators Aching   Pain Type Chronic pain   Pain Onset More than a month ago   Pain Frequency Intermittent     Objective:   AROM: left shoulder forward elevation 150 degrees, ER and IR in supine lying WNL's, Strength: left shoulder with isometric testing mid range: strong and non painful without grinding in joint noted,  improving scapular stabilization noted during exercises  Palpation: + spasms along left side cervical spine paraspinal muscles and along medial border of scapula       [facility] Adult PT Treatment/Exercise - 10/10/14 1126    Exercises   Exercises Other Exercises   Other Exercises  Modified exercises to lying supine today due to patient feeling fatigued: supine exercises: forward elevation at 90 - 100 degrees 2 x 10 reps, single arm chest press with 2# weight with contact guarding of left UE/suing for correct technnique x 10 reps, shoulder extension x 10 reps hold 5 seconds, ER off trunk with 2# dumbbell with assistance of therapist through short arc of motion for controlled movement, side lying upper trapezius release x 3 reps 20 seconds holds and lower trapezius strengthening with manual resistance x 10 reps      Manual therapy: STM performed to left upper trapezius and cervical spine muscles with patient supine/side lying prior to exercises  Patient response to treatment: improved soft tissue elasticity with reported decreased tenderness and spasms allowing patient to perform exercises with guidance for appropriate technique and intensity with decreased soreness to mild and good technique with verbal cues           PT Education - 10/10/14 1209    Education provided Yes  Education Details Instructed in lying down exercises when she is tired like today.    Person(s) Educated Patient   Methods Explanation   Comprehension Verbalized understanding             PT Long Term Goals - 09/11/14 1143    PT LONG TERM GOAL #1   Title Patient will improve AROM to 130 degrees left shoulder with mild discomfort for reaching into cabinet or performing hair care by 10/20/2014   Baseline AROM overhead with increased pain and decreased strength   Status On-going   PT LONG TERM GOAL #2   Title Patient will improve Quick Dash score to 30% or less by 07/27/2014 indicating improved functional use with  left UE with less difficulty and pain   Baseline initially 42% to 50% impairment   Status Achieved   PT LONG TERM GOAL #3   Title Patient will demonstrate improved strength to 4/5 for left UE forward elevation, ER left UE in order to improve function with overhead tasks by 11/20/2014   Baseline decreased strength (~3/5) for forward elevation/ER for using left UE overhead and shoulder level activties without difficulty   Status Revised   PT LONG TERM GOAL #4   Title Patient will improve quickDash score to 20% or less by 10/20/2014 indicating improved functional use with decreased pain in left shoulder   Baseline initial 42% to 50% impairment   Status Revised   PT LONG TERM GOAL #5   Title Patient will  be independent with home program for strength and pain control for left UE/shoulder by 11/20/2014   Baseline limited knowledge of pain control strategies or progression of exercises for left UE   Status Revised   PT LONG TERM GOAL #6   Title --   Baseline --   Status --               Plan - 10/10/14 1215    Clinical Impression Statement Patient demonstrated improved strength without increased pain in left shoulder. She continues with intermittent pain in left shoulder with increased use and requires assistance and verbal cues to perform appropriate exercises to strengthen left shoulder.    Pt will benefit from skilled therapeutic intervention in order to improve on the following deficits Decreased strength;Decreased activity tolerance;Decreased endurance   Rehab Potential Fair   PT Frequency 2x / week   PT Duration 12 weeks   PT Treatment/Interventions Manual techniques;Cryotherapy;Therapeutic exercise;Patient/family education   PT Next Visit Plan Motor control/movement patterning, increase musculature strength.        Problem List There are no active problems to display for this patient.   Beacher May PT 10/10/2014, 11:26 PM  [facility]  PHYSICAL AND SPORTS MEDICINE [address] [place], [address] Phone: [phone]   Fax:  [phone]

## 2014-10-16 ENCOUNTER — Encounter: Payer: Self-pay | Admitting: Physical Therapy

## 2014-10-16 ENCOUNTER — Ambulatory Visit: Attending: Orthopedic Surgery | Admitting: Physical Therapy

## 2014-10-16 DIAGNOSIS — M25512 Pain in left shoulder: Secondary | ICD-10-CM | POA: Diagnosis not present

## 2014-10-16 DIAGNOSIS — G8929 Other chronic pain: Secondary | ICD-10-CM | POA: Insufficient documentation

## 2014-10-16 DIAGNOSIS — M6281 Muscle weakness (generalized): Secondary | ICD-10-CM | POA: Insufficient documentation

## 2014-10-16 NOTE — Therapy (Signed)
[facility] PH[facility]TS [facility]ICINE [address] [place], [address] Phone: [phone]   Fax:  [phone]  Physical Therapy Treatment  Patient Details  Name: Suzanne Snyder MRN: [id number] Date of Birth: Dec 25, 1950 Referring Provider:  Carin Primrose, MD  Encounter Date: 10/16/2014      PT End of Session - 10/16/14 1200    Visit Number 26   Number of Visits 32   Date for PT Re-Evaluation 11/20/14   PT Start Time 1116   PT Stop Time 1155   PT Time Calculation (min) 39 min   Activity Tolerance Patient limited by fatigue   Behavior During Therapy [facility] for tasks assessed/performed      Past Medical History  Diagnosis Date  . Anxiety   . Diabetes mellitus without complication (HCC)   . Osteoporosis     Past Surgical History  Procedure Laterality Date  . 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 Spine surgery N/A 1984    L4-5, not sure of procedure  . Rotator cuff repair Left 2014 and 2015    There were no vitals filed for this visit.  Visit Diagnosis:  Chronic shoulder pain, left  Muscle weakness of left upper extremity      Subjective Assessment - 10/16/14 1119    Subjective Patient reports she is feeling about the same in her left shoulder as she was the previous session. She reports she is having a rubbing feeling in her left shoulder joint and not grinding like it used to.    Limitations House hold activities   Patient Stated Goals Patient would like to be able to lift, reach with left UE for improved function with daily activities with less pain    Currently in Pain? Yes   Pain Score 4    Pain Location Shoulder   Pain Orientation Left   Pain Descriptors / Indicators Aching   Pain Type Chronic pain   Pain Onset More than a month ago   Pain Frequency Intermittent   Multiple Pain Sites No         Objective:   AROM: left shoulder forward elevation 150 degrees, ER and IR in supine lying  WNL's, Strength: left shoulder with isometric testing mid range: strong and non painful without grinding in joint noted, improving scapular stabilization noted during exercises  Palpation: + spasms along left side cervical spine paraspinal muscles and along medial border of scapula        [facility] Adult PT Treatment/Exercise - 10/16/14 1126    Exercises   Exercises Other Exercises   Other Exercises   all exercises performed with guidance and verbal instruction and assistance of PT: sitting at [employer] cable exercise machine: verbal cuing and tactile cuing to perform seated scapular row 15# both UE's 2  x 10, supine exercises: forward elevation at 90 - 100 degrees 2 x 10 reps, single arm chest press with 2# weight with contact guarding of left UE/suing for correct technnique x 10 reps, biceps curls with 3# weight 2 x 15, alternated with triceps extension with assistance to hold left UE in proper alignment over face x 15 reps with 2# weight in hand, ER off trunk with 2# dumbbell with assistance of therapist trough short arc of motion for controlled movement, side lying upper trapezius release x 3 reps 20 seconds holds and lower trapezius strengthening with manual resistance x 10 reps        STM performed to  left upper trapezius and cervical spine muscles with patient supine/side lying prior to exercises  Patient response to treatment: improved soft tissue elasticity with reported decreased tenderness and spasms allowing patient to perform exercises with guidance for appropriate technique and intensity with  good technique with verbal cues and no grinding in GHJ noted         PT Long Term Goals - 09/11/14 1143    PT LONG TERM GOAL #1   Title Patient will improve AROM to 130 degrees left shoulder with mild discomfort for reaching into cabinet or performing hair care by 10/20/2014   Baseline AROM overhead with increased pain and decreased strength   Status On-going   PT LONG TERM GOAL #2   Title  Patient will improve Quick Dash score to 30% or less by 07/27/2014 indicating improved functional use with left UE with less difficulty and pain   Baseline initially 42% to 50% impairment   Status Achieved   PT LONG TERM GOAL #3   Title Patient will demonstrate improved strength to 4/5 for left UE forward elevation, ER left UE in order to improve function with overhead tasks by 11/20/2014   Baseline decreased strength (~3/5) for forward elevation/ER for using left UE overhead and shoulder level activties without difficulty   Status Revised   PT LONG TERM GOAL #4   Title Patient will improve quickDash score to 20% or less by 10/20/2014 indicating improved functional use with decreased pain in left shoulder   Baseline initial 42% to 50% impairment   Status Revised   PT LONG TERM GOAL #5   Title Patient will  be independent with home program for strength and pain control for left UE/shoulder by 11/20/2014   Baseline limited knowledge of pain control strategies or progression of exercises for left UE   Status Revised   PT LONG TERM GOAL #6   Title --   Baseline --   Status --               Plan - 10/16/14 1200    Clinical Impression Statement Patient demonstrates improving control and strength in left shoulder with guidance of therapist for correct technique and alignment of shoulder. She requires assistance and guidance to perform correct technique and control.    Pt will benefit from skilled therapeutic intervention in order to improve on the following deficits Decreased strength;Decreased activity tolerance;Decreased endurance   Rehab Potential Fair   PT Frequency 2x / week   PT Duration 12 weeks   PT Treatment/Interventions Manual techniques;Cryotherapy;Therapeutic exercise;Patient/family education   PT Next Visit Plan Motor control/movement patterning, increase musculature strength.        Problem List There are no active problems to display for this patient.   Beacher May PT 10/16/2014, 11:46 PM  [facility] PHYSICAL AND SPORTS MEDICINE [address], [place], [address] Phone: [phone]   Fax:  [phone]

## 2014-10-23 ENCOUNTER — Encounter: Payer: Self-pay | Admitting: Physical Therapy

## 2014-10-23 ENCOUNTER — Ambulatory Visit: Admitting: Physical Therapy

## 2014-10-23 DIAGNOSIS — M25512 Pain in left shoulder: Principal | ICD-10-CM

## 2014-10-23 DIAGNOSIS — M6281 Muscle weakness (generalized): Secondary | ICD-10-CM

## 2014-10-23 DIAGNOSIS — G8929 Other chronic pain: Secondary | ICD-10-CM

## 2014-10-23 NOTE — Therapy (Signed)
[facility] PHYSICAL [facility] SPORTS MEDICINE [address], [place], [address] Phone: [phone]   Fax:  [phone]  Physical Therapy Treatment  Patient Details  Name: BARBAR BREDE MRN: [id number] Date of Birth: [birth year] Referring Provider:  Carin Primrose, MD  Encounter Date: 10/23/2014      PT End of Session - 10/23/14 1114    Visit Number 27   Number of Visits 32   Date for PT Re-Evaluation 11/20/14   PT Start Time 1110   PT Stop Time 1143   PT Time Calculation (min) 33 min   Activity Tolerance Patient limited by fatigue;Patient tolerated treatment well   Behavior During Therapy [facility] for tasks assessed/performed      Past Medical History  Diagnosis Date  . Anxiety   . Diabetes mellitus without complication (HCC)   . Osteoporosis     Past Surgical History  Procedure Laterality Date  . 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 Spine surgery N/A 1984    L4-5, not sure of procedure  . Rotator cuff repair Left 2014 and 2015    There were no vitals filed for this visit.  Visit Diagnosis:  Chronic shoulder pain, left  Muscle weakness of left upper extremity      Subjective Assessment - 10/23/14 1110    Subjective Patient reports she has had a lot of moving activity since last session. She is being very careful to not lift with outreached arm.    Limitations House hold activities   Patient Stated Goals Patient would like to be able to lift, reach with left UE for improved function with daily activities with less pain    Currently in Pain? Yes   Pain Score 4    Pain Location Shoulder   Pain Orientation Left   Pain Descriptors / Indicators Aching   Pain Type Chronic pain   Pain Onset More than a month ago   Pain Frequency Intermittent   Multiple Pain Sites No       Objective: Palpation: + tenderness along cervical spine paraspinal muscles, and upper trapezius left LE Strength: left shoulder external  rotation 3+/5, shoulder abduction 3/5 with hiking of left shoulder noted       [facility] Adult PT Treatment/Exercise - 10/23/14 1114    Exercises   Exercises Other Exercises   Other Exercises  all exercises performed with guidance and verbal instruction and assistance of PT: sitting at [employer] cable exercise machine: verbal cuing and tactile cuing to perform seated scapular row 10# both UE's 2 x 10, supine exercises: forward elevation at 90 - 100 degrees 2 x 10 reps, single arm chest press with 2# weight with contact guarding of left UE for correct technnique x 10 reps,  triceps extension with assistance to hold left UE in proper alignment over face x 15 reps with 2# weight in hand, ER off trunk with 2# dumbbell with assistance of therapist trough short arc of motion for controlled movement, side lying upper trapezius release x 3 reps 20 seconds holds and lower trapezius strengthening with manual resistance x 10 reps, ER with 1# weight x 10 reps with assistance, shoulder abduction through 30 degrees with contact guard/assistance x 5 reps      Patient response to treatment: improved left shoulder control with shoulder forward elevation and abduction with verbal cuing and assistance of therapist          PT Education - 10/23/14 1145  Education provided Yes   Education Details instructed in holding exercise for shoulder abduction through partial ROM to add to HEP   Person(s) Educated Patient   Methods Explanation;Demonstration;Verbal cues   Comprehension Verbalized understanding;Returned demonstration;Verbal cues required             PT Long Term Goals - 09/11/14 1143    PT LONG TERM GOAL #1   Title Patient will improve AROM to 130 degrees left shoulder with mild discomfort for reaching into cabinet or performing hair care by 10/20/2014   Baseline AROM overhead with increased pain and decreased strength   Status On-going   PT LONG TERM GOAL #2   Title Patient will improve Quick Dash  score to 30% or less by 07/27/2014 indicating improved functional use with left UE with less difficulty and pain   Baseline initially 42% to 50% impairment   Status Achieved   PT LONG TERM GOAL #3   Title Patient will demonstrate improved strength to 4/5 for left UE forward elevation, ER left UE in order to improve function with overhead tasks by 11/20/2014   Baseline decreased strength (~3/5) for forward elevation/ER for using left UE overhead and shoulder level activties without difficulty   Status Revised   PT LONG TERM GOAL #4   Title Patient will improve quickDash score to 20% or less by 10/20/2014 indicating improved functional use with decreased pain in left shoulder   Baseline initial 42% to 50% impairment   Status Revised   PT LONG TERM GOAL #5   Title Patient will  be independent with home program for strength and pain control for left UE/shoulder by 11/20/2014   Baseline limited knowledge of pain control strategies or progression of exercises for left UE   Status Revised   PT LONG TERM GOAL #6   Title --   Baseline --   Status --               Plan - 10/23/14 1115    Clinical Impression Statement Patient is demonstrating improved knowledge of precautions for using left UE. She is slowly progressing with strength and AROM with guidance and verbal cues.    Pt will benefit from skilled therapeutic intervention in order to improve on the following deficits Decreased strength;Decreased activity tolerance;Decreased endurance   Rehab Potential Fair   PT Frequency 2x / week   PT Duration 12 weeks   PT Next Visit Plan Motor control/movement patterning, increase musculature strength.        Problem List There are no active problems to display for this patient.   Beacher May PT 10/23/2014, 10:04 PM  [facility] PHYSICAL AND SPORTS MEDICINE [address], [place], [address] Phone: [phone]   Fax:  [phone]

## 2014-10-30 ENCOUNTER — Ambulatory Visit: Admitting: Physical Therapy

## 2014-11-06 ENCOUNTER — Ambulatory Visit: Admitting: Physical Therapy

## 2014-11-06 ENCOUNTER — Encounter: Payer: Self-pay | Admitting: Physical Therapy

## 2014-11-06 DIAGNOSIS — M25512 Pain in left shoulder: Secondary | ICD-10-CM | POA: Diagnosis not present

## 2014-11-06 DIAGNOSIS — M6281 Muscle weakness (generalized): Secondary | ICD-10-CM

## 2014-11-06 DIAGNOSIS — G8929 Other chronic pain: Secondary | ICD-10-CM

## 2014-11-06 NOTE — Therapy (Signed)
[facility]TS MEDIC[facility] [address] [place], [place], [address] Phone: [phone]   Fax:  [phone]  Physical Therapy Treatment  Patient Details  Name: Suzanne Snyder MRN: [id number] Date of Birth: 16-Apr-1950 Referring Provider: Carin PrimroseKamath, Ganesh, MD  Encounter Date: 11/06/2014      PT End of Session - 11/06/14 1116    Visit Number 28   Number of Visits 32   Date for PT Re-Evaluation 11/20/14   PT Start Time 1116   PT Stop Time 1145   PT Time Calculation (min) 29 min   Activity Tolerance Patient limited by fatigue;Patient tolerated treatment well   Behavior [facility]      Past Medical History  Diagnosis Date  . Anxiety   . Diabetes mellitus without complication (HCC)   . Osteoporosis     Past Surgical History  Procedure Laterality Date  . 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 Spine surgery N/A 1984    L4-5, not sure of procedure  . Rotator cuff repair Left 2014 and 2015    There were no vitals filed for this visit.  Visit Diagnosis:  Chronic shoulder pain, left  Muscle weakness of left upper extremity      Subjective Assessment - 11/06/14 1116    Subjective Patient reports she was sick last week and has not been able to exercise as much. She was seen for follow up with MD and is to continue with therapy for a few more months. She is having popping and weakness in her shoulder.    Patient Stated Goals Patient would like to be able to lift, reach with left UE for improved function with daily activities with less pain    Currently in Pain? Yes   Pain Score 2    Pain Location Shoulder   Pain Orientation Left   Pain Descriptors / Indicators Aching   Pain Type Chronic pain   Pain Onset More than a month ago   Multiple Pain Sites No       Treatment:  Exercises:    All exercises performed with guidance and verbal instruction and assistance of PT: supine  exercises: forward elevation at 90 - 100 degrees 2 x 10 reps, single arm chest press with 2# weight with contact guarding of left UE/suing for correct technnique x 10 reps, triceps extension with assistance to hold left UE in proper alignment over face x 15 reps with 2# weight in hand, ER off trunk with 1- 2# dumbbell with assistance of therapist trough short arc of motion for controlled movement, RS at 90 degrees flexion and neurtral rotations x 3 sets of 10 reps, side lying upper trapezius release x 3 reps 20 seconds holds, scapular mobilization and lower trapezius strengthening with manual resistance x 10 reps, ER with assistance x 10 reps holding 1# dumbbell  Patient response to treatment: improved control and strength noted with repetition of exercises and with verbal cuing of therapist and assistance         PT Education - 11/06/14 1117    Education provided Yes   Education Details modified exercises with assistance and guidance for appropriate positioning and intensity   Person(s) Educated Patient   Methods Explanation;Demonstration;Verbal cues   Comprehension Verbalized understanding;Returned demonstration;Verbal cues required             PT Long Term Goals - 09/11/14 1143    PT LONG TERM GOAL #1  Title Patient will improve AROM to 130 degrees left shoulder with mild discomfort for reaching into cabinet or performing hair care by 10/20/2014   Baseline AROM overhead with increased pain and decreased strength   Status On-going   PT LONG TERM GOAL #2   Title Patient will improve Quick Dash score to 30% or less by 07/27/2014 indicating improved functional use with left UE with less difficulty and pain   Baseline initially 42% to 50% impairment   Status Achieved   PT LONG TERM GOAL #3   Title Patient will demonstrate improved strength to 4/5 for left UE forward elevation, ER left UE in order to improve function with overhead tasks by 11/20/2014   Baseline decreased strength (~3/5)  for forward elevation/ER for using left UE overhead and shoulder level activties without difficulty   Status Revised   PT LONG TERM GOAL #4   Title Patient will improve quickDash score to 20% or less by 10/20/2014 indicating improved functional use with decreased pain in left shoulder   Baseline initial 42% to 50% impairment   Status Revised   PT LONG TERM GOAL #5   Title Patient will  be independent with home program for strength and pain control for left UE/shoulder by 11/20/2014   Baseline limited knowledge of pain control strategies or progression of exercises for left UE   Status Revised   PT LONG TERM GOAL #6   Title --   Baseline --   Status --               Plan - 11/06/14 1207    Clinical Impression Statement Demonstrating improvement with strength of left shoulder. Patient requires assistance and verbal cuing to perform all exercises with good technique and with appropriate intensity to keep shoulder from grinding. She continues with limitations of weakness and spasms and will continue to benefit from physical therapy intervention to improve functional use.   Pt will benefit from skilled therapeutic intervention in order to improve on the following deficits Decreased strength;Decreased activity tolerance;Decreased endurance   Rehab Potential Fair   PT Frequency 2x / week   PT Duration 12 weeks   PT Treatment/Interventions Manual techniques;Cryotherapy;Therapeutic exercise;Patient/family education   PT Next Visit Plan Motor control/movement patterning, increase musculature strength.        Problem List There are no active problems to display for this patient.   Beacher May PT 11/07/2014, 12:52 PM  [place] [facility] PHYSICAL AND SPORTS MEDICINE [address] [place], [address] Phone: [phone]   Fax:  [phone]  Name: Suzanne Snyder MRN: [id number] Date of Birth: September 19, 1950

## 2014-11-13 ENCOUNTER — Ambulatory Visit: Attending: Orthopedic Surgery | Admitting: Physical Therapy

## 2014-11-13 ENCOUNTER — Encounter: Payer: Self-pay | Admitting: Physical Therapy

## 2014-11-13 DIAGNOSIS — G8929 Other chronic pain: Secondary | ICD-10-CM | POA: Diagnosis present

## 2014-11-13 DIAGNOSIS — M25512 Pain in left shoulder: Secondary | ICD-10-CM | POA: Insufficient documentation

## 2014-11-13 DIAGNOSIS — M6281 Muscle weakness (generalized): Secondary | ICD-10-CM | POA: Insufficient documentation

## 2014-11-13 NOTE — Therapy (Signed)
[facility]INE [address] [place], [address] Phone: [phone]   Fax:  [phone]  Physical Therapy Treatment  Patient Details  Name: Suzanne Snyder MRN: [id number] Date of Birth: 09-Mar-1950 Referring Provider: Carin Primrose, MD  Encounter Date: 11/13/2014      PT End of Session - 11/13/14 1117    Visit Number 29   Number of Visits 32   Date for PT Re-Evaluation 11/20/14   PT Start Time 1112   PT Stop Time 1155   PT Time Calculation (min) 43 min   Activity Tolerance Patient limited by fatigue;Patient tolerated treatment well   Behavior During Therapy [facility] for tasks assessed/performed      Past Medical History  Diagnosis Date  . Anxiety   . Diabetes mellitus without complication (HCC)   . Osteoporosis     Past Surgical History  Procedure Laterality Date  . 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 Spine surgery N/A 1984    L4-5, not sure of procedure  . Rotator cuff repair Left 2014 and 2015    There were no vitals filed for this visit.  Visit Diagnosis:  Chronic shoulder pain, left  Muscle weakness of left upper extremity      Subjective Assessment - 11/13/14 1114    Subjective Patient reports she is doing better. She is using left arm more and she feels she is getting sronger. she conitnues with intermittent pain in left shoulder with different movements.    Limitations House hold activities   Patient Stated Goals Patient would like to be able to lift, reach with left UE for improved function with daily activities with less pain    Currently in Pain? Yes   Pain Score 2    Pain Location Shoulder   Pain Orientation Left   Pain Descriptors / Indicators Aching   Pain Type Chronic pain   Pain Onset More than a month ago   Pain Frequency Intermittent   Multiple Pain Sites No       Objective: Left shoulder ER/IR 3+5 strength, flexion 3+/5, abduction 3+/5 Palpation: + spasms  palpable along left side cervical spine        [facility] Adult PT Treatment/Exercise - 11/13/14 1115    Exercises   Exercises Other Exercises   Other Exercises  sitting at [employer] cable exercise machine: verbal cuing and tactile cuing to perform seated scapular row 10# both UE's 2  x 10, single arm row 5# each UE 2 sets, lat pull downs with 10#  with tactile cues and verbal cues for correct alingment, reverse chin ups with 15# x 15 reps supine exercises: forward elevation rhythmic stabilization at 90 - 100 degrees 2 x 10 reps, neutral ER/IR rhythmic stabilization 2 sets of 10, 3# weight for forward elevation overhead in supine x 10 reps,   ER off trunk with 2# dumbbell with assistance of therapist trough short arc of motion for controlled movement      STM performed to cervical spine for reduction of muscle spasms, superficial techniques  Patient response to treatment: decreased muscle spasms allowing patient to perform exercises with less stiffness in neck, patient demonstrated improved technique with verbal cuing and tactile cues of therapist          PT Education - 11/13/14 1116    Education Details Reinforced home program of exercises   Person(s) Educated Patient   Methods Explanation;Demonstration;Verbal cues   Comprehension Verbalized understanding;Returned  demonstration;Verbal cues required             PT Long Term Goals - 09/11/14 1143    PT LONG TERM GOAL #1   Title Patient will improve AROM to 130 degrees left shoulder with mild discomfort for reaching into cabinet or performing hair care by 10/20/2014   Baseline AROM overhead with increased pain and decreased strength   Status On-going   PT LONG TERM GOAL #2   Title Patient will improve Quick Dash score to 30% or less by 07/27/2014 indicating improved functional use with left UE with less difficulty and pain   Baseline initially 42% to 50% impairment   Status Achieved   PT LONG TERM GOAL #3   Title Patient will  demonstrate improved strength to 4/5 for left UE forward elevation, ER left UE in order to improve function with overhead tasks by 11/20/2014   Baseline decreased strength (~3/5) for forward elevation/ER for using left UE overhead and shoulder level activties without difficulty   Status Revised   PT LONG TERM GOAL #4   Title Patient will improve quickDash score to 20% or less by 10/20/2014 indicating improved functional use with decreased pain in left shoulder   Baseline initial 42% to 50% impairment   Status Revised   PT LONG TERM GOAL #5   Title Patient will  be independent with home program for strength and pain control for left UE/shoulder by 11/20/2014   Baseline limited knowledge of pain control strategies or progression of exercises for left UE   Status Revised   PT LONG TERM GOAL #6   Title --   Baseline --   Status --               Plan - 11/13/14 1117    Clinical Impression Statement Patient demonstrated improvement with control of left shoulder with exercises with verbal and tactile cues of therapist. She continues with weakness an will benefit from continued physical therapy intervention.    Pt will benefit from skilled therapeutic intervention in order to improve on the following deficits Decreased strength;Decreased activity tolerance;Decreased endurance   Rehab Potential Fair   PT Frequency 2x / week   PT Duration 12 weeks   PT Treatment/Interventions Manual techniques;Cryotherapy;Therapeutic exercise;Patient/family education   PT Next Visit Plan Motor control/movement patterning, increase musculature strength.        Problem List There are no active problems to display for this patient.   Beacher MayBrooks, Marie PT 11/13/2014, 1:18 PM  [facility] [address] [place], [place], [address] Phone: [phone]   Fax:  [phone]  Name: Suzanne Snyder MRN: [id number] Date of Birth: December 21, 1950

## 2014-11-20 ENCOUNTER — Ambulatory Visit: Admitting: Physical Therapy

## 2014-11-20 ENCOUNTER — Encounter: Payer: Self-pay | Admitting: Physical Therapy

## 2014-11-20 DIAGNOSIS — M25512 Pain in left shoulder: Principal | ICD-10-CM

## 2014-11-20 DIAGNOSIS — G8929 Other chronic pain: Secondary | ICD-10-CM

## 2014-11-20 DIAGNOSIS — M6281 Muscle weakness (generalized): Secondary | ICD-10-CM

## 2014-11-21 NOTE — Therapy (Signed)
[facility] [address] [place], [place], [address] Phone: [phone]   Fax:  [phone]  Physical Therapy Treatment  Patient Details  Name: Suzanne LeberDeborah L Richoux MRN: [id number] Date of Birth: 1950/03/17 Referring Provider: Carin PrimroseKamath, Ganesh, MD  Encounter Date: 11/20/2014      PT End of Session - 11/20/14 1145    Visit Number 30   Number of Visits 32   Date for PT Re-Evaluation 01/02/15   PT Start Time 1114   PT Stop Time 1145   PT Time Calculation (min) 31 min   Activity Tolerance Patient limited by fatigue;Patient tolerated treatment well   Behavior During Therapy [facility] for tasks assessed/performed      Past Medical History  Diagnosis Date  . Anxiety   . Diabetes mellitus without complication (HCC)   . Osteoporosis     Past Surgical History  Procedure Laterality Date  . 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 Spine surgery N/A 1984    L4-5, not sure of procedure  . Rotator cuff repair Left 2014 and 2015    There were no vitals filed for this visit.  Visit Diagnosis:  Chronic shoulder pain, left  Muscle weakness of left upper extremity      Subjective Assessment - 11/20/14 1115    Subjective Patient reports she is doing better. She is using left arm more and she feels she is getting sronger. she conitnues with intermittent pain in left shoulder with different movements.    Patient Stated Goals Patient would like to be able to lift, reach with left UE for improved function with daily activities with less pain    Currently in Pain? Yes   Pain Score 4    Pain Location Shoulder   Pain Orientation Left   Pain Descriptors / Indicators Aching   Pain Type Chronic pain   Pain Onset More than a month ago   Pain Frequency Intermittent   Aggravating Factors  reaching, lifting    Effect of Pain on Daily Activities limits abilty to perform household chores normally without pain   Multiple Pain Sites No          Objective: Left shoulder ER/IR 3+5 strength, flexion 3+/5, abduction 3+/5 Palpation: + spasms palpable along left side cervical spine Outcome measure: QuickDash; 40% self perceived impairment       [facility] Adult PT Treatment/Exercise - 11/20/14 2237    Exercises   Exercises Other Exercises   Other Exercises  sitting at [employer] cable exercise machine: verbal cuing and tactile cuing to perform seated scapular row 10# both UE's 2  x 10, single arm row 5# each UE 2 sets, lat pull downs with 10# x 15 reps, supine exercises:forward elevation rhythmic stabilization at 90 - 100 degrees 2 x 10 reps, neutral ER/IR rhythmic stabilization 2 sets of 10,  forward elevation overhead in supine x 10 reps,ER off trunk  with assistance of therapist through short arc of motion for controlled movement forward elevation at 90 - 100 degrees 2 x 10 reps          STM performed to cervical spine for reduction of muscle spasms, superficial techniques with patient supine lying prior to exercises  Patient response to treatment: decreased muscle spasms allowing patient to perform exercises with less stiffness in neck, patient demonstrated improved technique with verbal cuing and guidance of therapist          PT Education - 11/20/14 1239  Education provided Yes   Education Details home program for positioning of left shoulder/UE to avoid pain during exercises, 90 degrees flexion, holding exercise with proper stabilization of scapula   Person(s) Educated Patient   Methods Explanation;Demonstration;Tactile cues;Verbal cues   Comprehension Verbalized understanding;Returned demonstration;Verbal cues required;Tactile cues required             PT Long Term Goals - 11/20/14 1200    PT LONG TERM GOAL #1   Title Patient will improve AROM to 130 degrees left shoulder with mild discomfort for reaching into cabinet or performing hair care by 12/13/2014   Baseline AROM overhead with increased pain and  decreased strength   Status Revised   PT LONG TERM GOAL #2   Title Patient will demonstrate improved strength to 4/5 for left UE forward elevation, ER left UE in order to improve function with overhead tasks by 01/02/2015   Baseline decreased strength (~3/5) for forward elevation/ER for using left UE overhead and shoulder level activties without difficulty   Status Revised   PT LONG TERM GOAL #3   Title Patient will improve quickDash score to 20% or less by 01/02/2015 indicating improved functional use with decreased pain in left shoulder   Baseline initial 42% to 50% impairment (surrent 40% self perceived impairment)   PT LONG TERM GOAL #4   Title Patient will  be independent with home program for strength and pain control for left UE/shoulder by 01/02/2015   Baseline limited knowledge of pain control strategies or progression of exercises for left UE   Status Revised               Plan - 11/20/14 1233    Clinical Impression Statement Patient is progressing steadily and slowly with improvement noted in strength of left UE and improved ability to perform daily tasks with less difficulty. She continues with pain and weakness in left shoulder that prevents full functional use of her left UE for all that is required of her in her daily routine. She requires assistance, verbal cuing and guidance of therapist to perform exercises wtih proper alignment and with proper intensity. She has limited knowledge of appropriate progression of exercises and will benefit from continued therapy to address limitations and progress exercises to allow transistion to independent home program.    Pt will benefit from skilled therapeutic intervention in order to improve on the following deficits Decreased strength;Decreased activity tolerance;Decreased endurance   Rehab Potential Fair   Clinical Impairments Affecting Rehab Potential multiple surgeries left shoulder for rotator cuff tears without complete return  to full function following either surgery   PT Frequency 1x / week   PT Duration 6 weeks   PT Treatment/Interventions Manual techniques;Cryotherapy;Therapeutic exercise;Patient/family education   PT Next Visit Plan Motor control/movement patterning, increase musculature strength.        Problem List There are no active problems to display for this patient.   Beacher May PT 11/21/2014, 1:43 PM  [facility] PHYSICAL AND SPORTS MEDICINE [address], [place], [address] Phone: [phone]   Fax:  [phone]  Name: MEDIA PIZZINI MRN: [id number] Date of Birth: Feb 10, 1950

## 2014-11-27 ENCOUNTER — Ambulatory Visit: Admitting: Physical Therapy

## 2014-11-27 ENCOUNTER — Encounter: Payer: Self-pay | Admitting: Physical Therapy

## 2014-11-27 DIAGNOSIS — G8929 Other chronic pain: Secondary | ICD-10-CM

## 2014-11-27 DIAGNOSIS — M6281 Muscle weakness (generalized): Secondary | ICD-10-CM

## 2014-11-27 DIAGNOSIS — M25512 Pain in left shoulder: Secondary | ICD-10-CM | POA: Diagnosis not present

## 2014-11-27 NOTE — Therapy (Signed)
[facility]ORTS MEDICINE [address], [place], [address] Phone: [phone]   Fax:  [phone]  Physical Therapy Treatment  Patient Details  Name: Suzanne Snyder MRN: [id number] Date of Birth: 18-May-1950 No Data Recorded Referring Provider: Carin Primrose, MD  Encounter Date: 11/27/2014      PT End of Session - 11/27/14 1523    Visit Number 31   Number of Visits 42   Date for PT Re-Evaluation 01/02/15   PT Start Time 1030   PT Stop Time 1100   PT Time Calculation (min) 30 min   Activity Tolerance Patient limited by fatigue;Patient tolerated treatment well   Behavior During Therapy [facility] for tasks assessed/performed      Past Medical History  Diagnosis Date  . Anxiety   . Diabetes mellitus without complication (HCC)   . Osteoporosis     Past Surgical History  Procedure Laterality Date  . 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 Spine surgery N/A 1984    L4-5, not sure of procedure  . Rotator cuff repair Left 2014 and 2015    There were no vitals filed for this visit.  Visit Diagnosis:  Chronic shoulder pain, left  Muscle weakness of left upper extremity      Subjective Assessment - 11/27/14 1049    Subjective Patient reports she is doing better. She is using left arm more and she feels she is getting sronger. She conitnues with intermittent pain in left shoulder with different movements. She continues with difficutly with hair care and overhead activity.    Limitations House hold activities   Patient Stated Goals Patient would like to be able to lift, reach with left UE for improved function with daily activities with less pain    Currently in Pain? Yes   Pain Score 2    Pain Location Shoulder   Pain Orientation Left   Pain Descriptors / Indicators Aching   Pain Type Chronic pain   Pain Onset More than a month ago   Pain Frequency Intermittent   Aggravating Factors  reaching and lifting  arm overhead   Effect of Pain on Daily Activities limites ability to perform household chores normally especially overhead positions and endurance activties   Multiple Pain Sites No      Objective: Strength: left shoulder ER 3+/5, forward elevation 3+/5, scapular adduction 4-/5  Palpation: cervical spine and upper trapezius muscles with spasms bilateral  Posture: increased hiking left shoulder        [facility] Adult PT Treatment/Exercise - 11/27/14 1052    Exercises   Exercises Other Exercises   Other Exercises  Exercises performed with guidance, instruction, verbal and tactile cuing as needed from physical therapist: supine exercises: forward elevation at 90 - 100 degrees 2 x 10 reps, single arm chest press with 2# weight with contact guarding of left UE/suing for correct technnique x 10 reps, rhythmic stabilization at 90 degrees elevation and neutral rotations 2-3 sets, ER off trunk with 2# dumbbell with assistance of therapist through short arc of motion for controlled movement x 10 reps, standing using UE ranger for flexion and rotations with 3# weight on arm 2 sets 10 reps      Manual therapy: STM cervical spine/upper trapezius muscles prior to exercises superficial techniques for decreasing spasms and pain, with patient supine lying  Patient response to treatment: improved strength in left shoulder with decreased popping with modified intensity with standing exercises, required  verbal cues and guidance to perform all exercises        PT Education - 11/27/14 1100    Education provided Yes   Education Details Instructed patient to perform endurance exercises with resistive band for scapular rows   Person(s) Educated Patient   Methods Explanation;Demonstration;Verbal cues   Comprehension Verbalized understanding;Verbal cues required             PT Long Term Goals - 11/20/14 1200    PT LONG TERM GOAL #1   Title Patient will improve AROM to 130 degrees left shoulder with  mild discomfort for reaching into cabinet or performing hair care by 12/13/2014   Baseline AROM overhead with increased pain and decreased strength   Status Revised   PT LONG TERM GOAL #2   Title Patient will demonstrate improved strength to 4/5 for left UE forward elevation, ER left UE in order to improve function with overhead tasks by 01/02/2015   Baseline decreased strength (~3/5) for forward elevation/ER for using left UE overhead and shoulder level activties without difficulty   Status Revised   PT LONG TERM GOAL #3   Title Patient will improve quickDash score to 20% or less by 01/02/2015 indicating improved functional use with decreased pain in left shoulder   Baseline initial 42% to 50% impairment   PT LONG TERM GOAL #4   Title Patient will  be independent with home program for strength and pain control for left UE/shoulder by 01/02/2015   Baseline limited knowledge of pain control strategies or progression of exercises for left UE   Status Revised               Problem List There are no active problems to display for this patient.   Beacher MayBrooks, Marie PT 11/27/2014, 9:37 PM  [facility] [address] [place], [place], [id number] Phone: [phone]   Fax:  [phone]  Name: Suzanne Snyder MRN: [id number] Date of Birth: November 26, 1950

## 2014-12-04 ENCOUNTER — Ambulatory Visit: Admitting: Physical Therapy

## 2014-12-05 ENCOUNTER — Ambulatory Visit: Admitting: Physical Therapy

## 2014-12-05 ENCOUNTER — Encounter: Payer: Self-pay | Admitting: Physical Therapy

## 2014-12-05 DIAGNOSIS — G8929 Other chronic pain: Secondary | ICD-10-CM

## 2014-12-05 DIAGNOSIS — M6281 Muscle weakness (generalized): Secondary | ICD-10-CM

## 2014-12-05 DIAGNOSIS — M25512 Pain in left shoulder: Principal | ICD-10-CM

## 2014-12-05 NOTE — Therapy (Signed)
[facility] PHYS[facility] SPORTS MEDICINE [address] [place], [address] Phone: [phone]   Fax:  [phone]  Physical Therapy Treatment  Patient Details  Name: Suzanne Snyder MRN: [id number] Date of Birth: Dec 16, 1950 Referring Provider: Carin Primrose, MD  Encounter Date: 12/05/2014      PT End of Session - 12/05/14 1010    Visit Number 32   Number of Visits 42   Date for PT Re-Evaluation 01/02/15   PT Start Time 1005   PT Stop Time 1035   PT Time Calculation (min) 30 min   Activity Tolerance Patient limited by fatigue;Patient tolerated treatment well   Behavior During Therapy [facility] for tasks assessed/performed      Past Medical History  Diagnosis Date  . Anxiety   . Diabetes mellitus without complication (HCC)   . Osteoporosis     Past Surgical History  Procedure Laterality Date  . 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 Spine surgery N/A 1984    L4-5, not sure of procedure  . Rotator cuff repair Left 2014 and 2015    There were no vitals filed for this visit.  Visit Diagnosis:  Chronic shoulder pain, left  Muscle weakness of left upper extremity      Subjective Assessment - 12/05/14 1009    Subjective Patient reports she is doing better. She is using left arm more and she feels she is getting sronger. she conitnues with intermittent pain in left shoulder with different movements.  she continues with difficutly with hair care and overhead activity.    Patient Stated Goals Patient would like to be able to lift, reach with left UE for improved function with daily activities with less pain    Currently in Pain? Yes   Pain Score 4    Pain Location Shoulder   Pain Orientation Left   Pain Descriptors / Indicators Aching   Pain Type Chronic pain   Pain Onset More than a month ago   Pain Frequency Intermittent       Objective; Observation; patient with rounded shoulders and guarded posture on  arrival AROM: scapular adduction and any shoulder motion with + popping and grinding noted in GHJ and scapulo thoracic joints        [facility] Adult PT Treatment/Exercise - 12/05/14 1009    Exercises   Exercises Other Exercises   Other Exercises  patient performed exercises with guidance, verbal and tactile cues and demonstration of PT: supine exercises: rhythmic stabilization with moderated resistance manually; forward elevation at 90 - 100 degrees 3 x 10 reps, single arm chest press without weight with contact guarding of left UE/suing for correct technnique x 10 reps, supine lying scapular adduction 2 x 10 reps 5 seconds holds with graded manual resistance to avoid grinding of joints in shoulder/scapulo thoracic joint, standing isometric exercises performed with resistive tubing at door with instruction/demonstration; scapular adduction x 10 reps, IR/ER x 10 reps      Manual therapy: STM to cervical spine/upper trapezius muscles with patient supine lying pre exercise for reduction of muscle tightness, spasms    Patient response to treatment: patient able to perform exercises with modification to isometrics without aggravating grinding/popping in shoulder and scapular joints, She required verbal cues and demonstration to perform exercises with resistive band at door with good alignment and technique, no grinding or popping noted with isometric exercises  Patient eduction: Yes Instructed to modify exercises at home with resistive band and  isometric holding exercises in pain free range and to avoid grinding/popping through ROM Instructed with demonstration, verbal cues and patient returned demonstration with verbal cues and demonstration      PT Long Term Goals - 11/20/14 1200    PT LONG TERM GOAL #1   Title Patient will improve AROM to 130 degrees left shoulder with mild discomfort for reaching into cabinet or performing hair care by 12/13/2014   Baseline AROM overhead with increased pain and  decreased strength   Status Revised   PT LONG TERM GOAL #2   Title Patient will demonstrate improved strength to 4/5 for left UE forward elevation, ER left UE in order to improve function with overhead tasks by 01/02/2015   Baseline decreased strength (~3/5) for forward elevation/ER for using left UE overhead and shoulder level activties without difficulty   Status Revised   PT LONG TERM GOAL #3   Title Patient will improve quickDash score to 20% or less by 01/02/2015 indicating improved functional use with decreased pain in left shoulder   Baseline initial 42% to 50% impairment   PT LONG TERM GOAL #4   Title Patient will  be independent with home program for strength and pain control for left UE/shoulder by 01/02/2015   Baseline limited knowledge of pain control strategies or progression of exercises for left UE   Status Revised               Plan - 12/05/14 1035    Clinical Impression Statement Patient demonstrated improved abiltiy to perform exercises with modification. Limited exercises due to patient having cough and unable to perform exercises without aggravating cough. She demonstrated decreased grinding/popping and pain in shoulder following treatment.    Pt will benefit from skilled therapeutic intervention in order to improve on the following deficits Decreased strength;Decreased activity tolerance;Decreased endurance   PT Frequency 1x / week   PT Duration 6 weeks   PT Treatment/Interventions Manual techniques;Cryotherapy;Therapeutic exercise;Patient/family education   PT Next Visit Plan Motor control/movement patterning, increase musculature strength.        Problem List There are no active problems to display for this patient.   Beacher MayBrooks, Marie PT 12/05/2014, 10:57 AM  [facility] [address] [place], [place], [address] Phone: [phone]   Fax:  [phone]  Name: Henreitta LeberDeborah L Schwandt MRN:  [id number] Date of Birth: 1950/03/25

## 2014-12-11 ENCOUNTER — Ambulatory Visit: Admitting: Physical Therapy

## 2014-12-11 ENCOUNTER — Encounter: Payer: Self-pay | Admitting: Physical Therapy

## 2014-12-11 DIAGNOSIS — M25512 Pain in left shoulder: Secondary | ICD-10-CM | POA: Diagnosis not present

## 2014-12-11 DIAGNOSIS — G8929 Other chronic pain: Secondary | ICD-10-CM

## 2014-12-11 DIAGNOSIS — M6281 Muscle weakness (generalized): Secondary | ICD-10-CM

## 2014-12-11 NOTE — Therapy (Signed)
[place] [facility] [address] [place], [place], [address] Phone: [phone]   Fax:  [phone]  Physical Therapy Treatment  Patient Details  Name: Suzanne LeberDeborah L Meditz MRN: [id number] Date of Birth: 12-Mar-1950 No Data Recorded  Encounter Date: 12/11/2014      PT End of Session - 12/11/14 1200    Visit Number 33   Number of Visits 42   Date for PT Re-Evaluation 01/02/15   PT Start Time 1105   PT Stop Time 1140   PT Time Calculation (min) 35 min   Activity Tolerance Patient limited by fatigue;Patient tolerated treatment well   Behavior [facility] for tasks assessed/performed      Past Medical History  Diagnosis Date  . Anxiety   . Diabetes mellitus without complication (HCC)   . Osteoporosis     Past Surgical History  Procedure Laterality Date  . 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 Spine surgery N/A 1984    L4-5, not sure of procedure  . Rotator cuff repair Left 2014 and 2015    There were no vitals filed for this visit.  Visit Diagnosis:  Chronic shoulder pain, left  Muscle weakness of left upper extremity      Subjective Assessment - 12/11/14 1108    Subjective Patient reports she is about the same as last visit regarding her left arm and she has been sick as well over the weekend. She reports she is still feeling grinding in her left shoulder and pain intermittently. She has not been able to exercise but did cook [place] over the holiday and lifted [place] in/out of oven and felt a strain on her left shoulder.   Limitations House hold activities   Patient Stated Goals Patient would like to be able to lift, reach with left UE for improved function with daily activities with less pain    Currently in Pain? Yes   Pain Score 4    Pain Location Shoulder   Pain Orientation Left   Pain Descriptors / Indicators Aching, stiffness   Pain Onset More than a month ago   Pain Frequency Intermittent    Multiple Pain Sites No       Objective:  Strength: left UE shoulder forward elevation 3+/5 with hiking of left shoulder above shoulder level, ER 3+/5, IR 4/5 AROM: left shoulder forward elevation 0-130 degrees with mild hiking left shoulder, ER 0-80, IR behind back       [facility]/Exercise - 12/11/14 1114    Exercises   Exercises Other Exercises   Other Exercises  All exercises performed with supervision, guidance, verbal and tactile cues of PT: supine exercises: forward elevation with rhythmic stabilization at 90 - 100 degrees 2 x 10 reps, bilateral chest press with 3# weight with contact guarding of left UE/suing for correct technnique x 10 reps, shoulder extension x 10 reps hold 5 seconds, ER off trunk with 1# dumbbell with assistance of therapist through short arc of motion for controlled movement, bilateral flexion overhead with 3# weight 2 x 10 reps with verbal cues for correct positioning of shoulders and through appropriate ROM to avoid increased strain on left shoulder      Patient response to treatment: improved strength noted with repetition and improved endurance with all exercises, patient limited by fatigue and required verbal and tactile cues to perform through full ROM and with good motor control  PT Education - 12/11/14 1146    Education provided Yes   Education Details HEP for isometric strengthening and holding end ranges to improve control with less popping in joints   Person(s) Educated Patient   Methods Explanation;Demonstration;Verbal cues   Comprehension Verbalized understanding;Returned demonstration;Verbal cues required             PT Long Term Goals - 11/20/14 1200    PT LONG TERM GOAL #1   Title Patient will improve AROM to 130 degrees left shoulder with mild discomfort for reaching into cabinet or performing hair care by 12/13/2014   Baseline AROM overhead with increased pain and decreased strength   Status Revised    PT LONG TERM GOAL #2   Title Patient will demonstrate improved strength to 4/5 for left UE forward elevation, ER left UE in order to improve function with overhead tasks by 01/02/2015   Baseline decreased strength (~3/5) for forward elevation/ER for using left UE overhead and shoulder level activties without difficulty   Status Revised   PT LONG TERM GOAL #3   Title Patient will improve quickDash score to 20% or less by 01/02/2015 indicating improved functional use with decreased pain in left shoulder   Baseline initial 42% to 50% impairment   PT LONG TERM GOAL #4   Title Patient will  be independent with home program for strength and pain control for left UE/shoulder by 01/02/2015   Baseline limited knowledge of pain control strategies or progression of exercises for left UE   Status Revised               Plan - 12/11/14 1200    Clinical Impression Statement Patient demonstrated good control and no popping or grinding during therapy session today. She is progressing with improved motor control and strength in left shoulder/UE and should continue with weekly physical therapy intervention. Patient is getting over sickness from last week which limited amount of exercise performed today.    Pt will benefit from skilled therapeutic intervention in order to improve on the following deficits Decreased strength;Decreased activity tolerance;Decreased endurance   Rehab Potential Fair   PT Frequency 1x / week   PT Duration 6 weeks   PT Treatment/Interventions Manual techniques;Cryotherapy;Therapeutic exercise;Patient/family education   PT Next Visit Plan Motor control/movement patterning, increase musculature strength.        Problem List There are no active problems to display for this patient.   Beacher May PT 12/11/2014, 7:10 PM  [place] [facility] PHYSICAL AND SPORTS MEDICINE [address] [place], [address] Phone: [phone]   Fax:   [phone]  Name: LEXIE KOEHL MRN: [id number] Date of Birth: 03-Oct-1950

## 2014-12-28 ENCOUNTER — Ambulatory Visit: Attending: Orthopedic Surgery | Admitting: Physical Therapy

## 2014-12-28 ENCOUNTER — Encounter: Payer: Self-pay | Admitting: Physical Therapy

## 2014-12-28 DIAGNOSIS — M25512 Pain in left shoulder: Secondary | ICD-10-CM | POA: Insufficient documentation

## 2014-12-28 DIAGNOSIS — M6281 Muscle weakness (generalized): Secondary | ICD-10-CM | POA: Diagnosis present

## 2014-12-28 DIAGNOSIS — G8929 Other chronic pain: Secondary | ICD-10-CM | POA: Diagnosis present

## 2014-12-29 NOTE — Therapy (Signed)
[place] [facility] [address], [place], [address] Phone: [phone]   Fax:  [phone]  Physical Therapy Treatment  Patient Details  Name: Suzanne Snyder MRN: [id number] Date of Birth: 09-19-1950 Referring Provider: Carin Primrose, MD  Encounter Date: 12/28/2014      PT End of Session - 12/28/14 1105    Visit Number 34   Number of Visits 42   Date for PT Re-Evaluation 01/02/15   PT Start Time 1101   PT Stop Time 1142   PT Time Calculation (min) 41 min   Activity Tolerance Patient limited by fatigue;Patient tolerated treatment well   Behavior During Therapy [facility] for tasks assessed/performed      Past Medical History  Diagnosis Date  . Anxiety   . Diabetes mellitus without complication (HCC)   . Osteoporosis     Past Surgical History  Procedure Laterality Date  . 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 Spine surgery N/A 1984    L4-5, not sure of procedure  . Rotator cuff repair Left 2014 and 2015    There were no vitals filed for this visit.  Visit Diagnosis:  Chronic shoulder pain, left  Muscle weakness of left upper extremity      Subjective Assessment - 12/28/14 1106    Subjective Patient reports she is about the same as last visit regarding her left arm and she has been recovering from sickness as well over the past week.  She reports she is still feeling grinding in her left shoulder and pain intermittently.    Limitations House hold activities   Patient Stated Goals Patient would like to be able to lift, reach with left UE for improved function with daily activities with less pain    Currently in Pain? Yes   Pain Score 4    Pain Location Shoulder   Pain Orientation Left   Pain Descriptors / Indicators Aching   Pain Type Chronic pain   Pain Onset More than a month ago   Pain Frequency Intermittent   Multiple Pain Sites No      Objective:  Strength: left UE shoulder  forward elevation 3+/5 with hiking of left shoulder above shoulder level, ER 3+/5, IR 4/5 AROM: left shoulder forward elevation 0-130 degrees with mild hiking left shoulder, ER 0-80, IR behind back Observation; muscle atrophy posterior aspect of left shoulder infraspinatus, improving muscle definition and muscle bulk noted in deltoid        [facility] Adult PT Treatment/Exercise - 12/28/14 1113    Exercises   Exercises Other Exercises   Other Exercises  All exercises performed with supervision, guidance, verbal and tactile cues of PT: supine exercises: forward elevation with rhythmic stabilization at 90 - 100 degrees 2 x 10 reps,  ER/IR neutral position with rhythmic stabilization 2 x 10 reps, both with moderate manual resistance, side lying ER with assistance of therapist through short arc of motion for controlled movement 2 x 5 reps, shoulder abduction with short lever arm through 90 degrees of motion 2 x 5 reps, side lying upper trapezius stretch 3 x 20 second holds, all exercises performed with verbal cues for correct positioning of shoulders and through appropriate ROM to avoid increased strain on left shoulder     Manual therapy: with patient in sitting on treatment table; STM to upper trapezius muscles, along medial border of left scapula superficial techniques and trigger point techniques to improve soft tissue elasticity and  decrease spasms/TrP tenderness followed by exercises  Patient response to treatment; improved soft tissue elasticity and ability to perform exercises with increased resistance with repetition, improved ability to perform ER in side lying with assistance and repetition with controlled motion, mild to no grinding in left shoulder noted with all exercises with guidance of therapist         PT Education - 12/28/14 1200    Education provided Yes   Education Details HEP: isometric and eccentric for ER and abduction left UE   Person(s) Educated Patient   Methods  Explanation;Demonstration;Verbal cues;Tactile cues   Comprehension Verbalized understanding;Returned demonstration;Verbal cues required;Tactile cues required             PT Long Term Goals - 11/20/14 1200    PT LONG TERM GOAL #1   Title Patient will improve AROM to 130 degrees left shoulder with mild discomfort for reaching into cabinet or performing hair care by 12/13/2014   Baseline AROM overhead with increased pain and decreased strength   Status Revised   PT LONG TERM GOAL #2   Title Patient will demonstrate improved strength to 4/5 for left UE forward elevation, ER left UE in order to improve function with overhead tasks by 01/02/2015   Baseline decreased strength (~3/5) for forward elevation/ER for using left UE overhead and shoulder level activties without difficulty   Status Revised   PT LONG TERM GOAL #3   Title Patient will improve quickDash score to 20% or less by 01/02/2015 indicating improved functional use with decreased pain in left shoulder   Baseline initial 42% to 50% impairment   PT LONG TERM GOAL #4   Title Patient will  be independent with home program for strength and pain control for left UE/shoulder by 01/02/2015   Baseline limited knowledge of pain control strategies or progression of exercises for left UE   Status Revised               Plan - 12/28/14 1201    Clinical Impression Statement Patient is progressing well with understanding of exercises and modification/progression to achieve lasting and functional results. She continues with limitations of decreased strength and knowledge of progression of exercises and will therefore benefit from additional physical therapy intervention to achieve goals.    Pt will benefit from skilled therapeutic intervention in order to improve on the following deficits Decreased strength;Decreased activity tolerance;Decreased endurance   Rehab Potential Fair   PT Frequency 1x / week   PT Duration 6 weeks   PT  Treatment/Interventions Manual techniques;Cryotherapy;Therapeutic exercise;Patient/family education   PT Next Visit Plan Motor control/movement patterning, increase musculature strength.        Problem List There are no active problems to display for this patient.   Beacher MayBrooks, Riley Papin PT 12/29/2014, 4:49 PM  [place] [facility] [address] [place], [place], [address] Phone: [phone]   Fax:  [phone]  Name: Suzanne Snyder MRN: [id number] Date of Birth: [birth year]

## 2015-01-02 ENCOUNTER — Ambulatory Visit: Admitting: Physical Therapy

## 2015-01-02 ENCOUNTER — Encounter: Payer: Self-pay | Admitting: Physical Therapy

## 2015-01-02 DIAGNOSIS — M25512 Pain in left shoulder: Principal | ICD-10-CM

## 2015-01-02 DIAGNOSIS — M6281 Muscle weakness (generalized): Secondary | ICD-10-CM

## 2015-01-02 DIAGNOSIS — G8929 Other chronic pain: Secondary | ICD-10-CM

## 2015-01-03 NOTE — Therapy (Signed)
[facility] PHYS[facility]TS MEDICINE [address], [place], [address] Phone: [phone]   Fax:  [phone]  Physical Therapy Treatment  Patient Details  Name: Suzanne Snyder MRN: [id number] Date of Birth: 25-Jul-1950 Referring Provider: Carin Primrose, MD  Encounter Date: 01/02/2015      PT End of Session - 01/02/15 1139    Visit Number 35   Number of Visits 42   Date for PT Re-Evaluation 02/14/15   PT Start Time 1133   PT Stop Time 1213   PT Time Calculation (min) 40 min   Activity Tolerance Patient limited by fatigue;Patient tolerated treatment well   Behavior During Therapy [facility] for tasks assessed/performed      Past Medical History  Diagnosis Date  . Anxiety   . Diabetes mellitus without complication (HCC)   . Osteoporosis     Past Surgical History  Procedure Laterality Date  . 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 Spine surgery N/A 1984    L4-5, not sure of procedure  . Rotator cuff repair Left 2014 and 2015    There were no vitals filed for this visit.  Visit Diagnosis:  Chronic shoulder pain, left  Muscle weakness of left upper extremity      Subjective Assessment - 01/02/15 1137    Subjective grinding more in left shoulder today. She reports she still has difficulty raising arm above shoulder level and lifting. She feels she is improving with strength and guidance of therapist and would like to continue with therapy.    Limitations House hold activities   Patient Stated Goals Patient would like to be able to lift, reach with left UE for improved function with daily activities with less pain    Currently in Pain? Yes   Pain Score 4    Pain Location Shoulder   Pain Orientation Left   Pain Descriptors / Indicators Aching   Pain Type Chronic pain   Pain Onset More than a month ago      Objective; AROM left UE shoulder forward elevation in sitting 0-120 with effort and pain Strength: left  shoulder forward elevation 3+/5, ER 3+/5, IR 4/5, extension 4/5 Palpation: + atrophy of posterior aspect left shoulder teres, infraspinatus muscles, + spasms in both upper trapezius muscles Posture: forward rounded left shoulder       [facility] Adult PT Treatment/Exercise - 01/02/15 1139    Exercises   Exercises Other Exercises   Other Exercises  patient performed exercises with guidance, verbal and tactile cues and demonstration of PT: supine exercises: forward elevation at 90 - 100 degrees 3 x 10 reps, isometric ER in neutral with arm in plane of scapula 3 x 10 reps with moderate resistance, instructed in scapular adduction in standing with resistive band to be done 10 reps with holding 5 seconds each, ER with resistive band in short arc to improve endurance and strength, stretching for pectoralis minor and improve pain free forward elevation.       manual therapy soft tissue mobilization with patient seated in massage chair: uuper trapezius muscles, posterior aspect left shoulder/teres and infraspinatus muscles to improve soft tissue elasticity  Patient response to treatment: improved ability to perform exercises with less difficulty and able to tolerate increased resistance with repetition, decreased grinding in left shoulder with isometric exercises performed with correct alignment of shoulder/trunk, patient requires verbal cuing and guidance to perform exercises with appropriate intensity, modification as needed  PT Education - 01/02/15 1216    Education provided Yes   Education Details HEP: continue with isometrics, resistive band for short arc ER, scapular adduction with resistive band   Person(s) Educated Patient   Methods Explanation;Demonstration;Verbal cues   Comprehension Verbalized understanding;Returned demonstration;Verbal cues required             PT Long Term Goals - 01/02/15 1230    PT LONG TERM GOAL #1   Title Patient will improve AROM to 130 degrees  left shoulder with mild discomfort for reaching into cabinet or performing hair care by 02/14/2015   Baseline AROM overhead to 120 with increased pain and decreased strength   Status Revised   PT LONG TERM GOAL #2   Title Patient will demonstrate improved strength to 4/5 for left UE forward elevation, ER left UE in order to improve function with overhead tasks by 02/14/2015   Baseline decreased strength (~3+/5) for forward elevation/ER for using left UE overhead and shoulder level activties without difficulty   Status Revised   PT LONG TERM GOAL #3   Title Patient will improve quickDash score to 20% or less by 02/14/2015 indicating improved functional use with decreased pain in left shoulder   Baseline initial 42% to 50% impairment (current score 36% self perceived disability)   Status Revised   PT LONG TERM GOAL #4   Title Patient will  be independent with home program for strength and pain control for left UE/shoulder by 02/14/2015   Baseline limited knowledge of pain control strategies or progression of exercises for left UE   Status Revised               Plan - 01/02/15 1226    Clinical Impression Statement Patinet is progressing well with improving strength in left shoulder. She continues with weakness in left shoulder and scapular muscles and cannot perform exercises overhead without difficulty. She will require continued physical therapy intervention to improve strength and function with left UE.    Pt will benefit from skilled therapeutic intervention in order to improve on the following deficits Decreased strength;Decreased activity tolerance;Decreased endurance   Rehab Potential Fair   PT Frequency 1x / week   PT Duration 6 weeks   PT Treatment/Interventions Manual techniques;Cryotherapy;Therapeutic exercise;Patient/family education   PT Next Visit Plan Motor control/movement patterning, increase musculature strength.        Problem List There are no active problems to  display for this patient.   Beacher MayBrooks, Adarsh Mundorf PT 01/03/2015, 10:11 AM  [place] [facility] [address] [place], [place], [address] Phone: [phone]   Fax:  [phone]  Name: Henreitta LeberDeborah L Agerton MRN: [id number] Date of Birth: April 02, 1950

## 2015-01-08 ENCOUNTER — Encounter: Payer: Self-pay | Admitting: Physical Therapy

## 2015-01-08 ENCOUNTER — Ambulatory Visit: Admitting: Physical Therapy

## 2015-01-08 DIAGNOSIS — M25512 Pain in left shoulder: Principal | ICD-10-CM

## 2015-01-08 DIAGNOSIS — M6281 Muscle weakness (generalized): Secondary | ICD-10-CM

## 2015-01-08 DIAGNOSIS — G8929 Other chronic pain: Secondary | ICD-10-CM

## 2015-01-08 NOTE — Therapy (Signed)
[facility] [address], [place], [address] Phone: [phone]   Fax:  [phone]  Physical Therapy Treatment  Patient Details  Name: Suzanne Snyder MRN: [id number] Date of Birth: Apr 03, 1950 No Data Recorded  Encounter Date: 01/08/2015      PT End of Session - 01/08/15 1132    Visit Number 36   Number of Visits 42   Date for PT Re-Evaluation 02/14/15   PT Start Time 1125   PT Stop Time 1215   PT Time Calculation (min) 50 min   Activity Tolerance Patient tolerated treatment well;Patient limited by pain   Behavior During Therapy [facility] for tasks assessed/performed      Past Medical History  Diagnosis Date  . Anxiety   . Diabetes mellitus without complication (HCC)   . Osteoporosis     Past Surgical History  Procedure Laterality Date  . 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 Spine surgery N/A 1984    L4-5, not sure of procedure  . Rotator cuff repair Left 2014 and 2015    There were no vitals filed for this visit.  Visit Diagnosis:  Chronic shoulder pain, left  Muscle weakness of left upper extremity      Subjective Assessment - 01/08/15 1130    Subjective Patient reports her left shoulder is stiff and painful today. She noticed it began to hurt last week and she has difficulty with raising it out to her side.    Limitations House hold activities   Patient Stated Goals Patient would like to be able to lift, reach with left UE for improved function with daily activities with less pain    Currently in Pain? Yes   Pain Score 7    Pain Location Shoulder   Pain Orientation Left   Pain Descriptors / Indicators Aching   Pain Type Chronic pain   Pain Onset More than a month ago   Pain Frequency Intermittent       Objective; AROM left shoulder: unable to raise arm out to side due to increased pain in shoulder, palpation: + point tender lateral aspect over lateral deltoid, passive  abduction and active abduction both increased pain in shoulder Observation; guarded posture, difficulty with doffing shirt over her head (bent over) prior to treatment        [facility] Adult PT Treatment/Exercise - 01/08/15 1131    Exercises   Exercises Other Exercises   Other Exercises  patient performed exercises with guidance, verbal and tactile cues and demonstration of PT: supine exercises: forward elevation at 90 - 100 degrees 2 x 10 reps, ER off trunk with assistance through short arc of motion for controlled movement, side lying upper trapezius release x 3 reps 20 seconds holds and lower trapezius strengthening with manual resistance x 10 reps, shoulder elevation/depression with tactile/verbal cues x 10 reps   Modalities   Modalities Ultrasound   Ultrasound   Ultrasound Location left shoulder lateral aspect: bursa/deltoid   Ultrasound Parameters 20% pulsed performed by PT @ 1.4w/cm2 x 8 min. With patient  Supine lying, left UE supported by towels followed by exercises   Ultrasound Goals Pain  inflammation     Manual therapy soft tissue mobilization with patient supine lying: uuper trapezius muscles, posterior aspect left shoulder/teres and infraspinatus muscles to improve soft tissue elasticity Patient response to treatment: decreased pain and tenderness by 50%+ left shoulder following [place] and able to perform exercises with mild/moderate  resistance and guidance of therapist, able to put on shirt following treatment with much less difficulty          PT Education - 01/08/15 1210    Education Details HEP: AROM only, no resistive motion for up to 3 days until shoulder pain subsides   Person(s) Educated Patient   Methods Explanation   Comprehension Verbalized understanding             PT Long Term Goals - 01/02/15 1230    PT LONG TERM GOAL #1   Title Patient will improve AROM to 130 degrees left shoulder with mild discomfort for reaching into cabinet or performing hair  care by 02/14/2015   Baseline AROM overhead to 120 with increased pain and decreased strength   Status Revised   PT LONG TERM GOAL #2   Title Patient will demonstrate improved strength to 4/5 for left UE forward elevation, ER left UE in order to improve function with overhead tasks by 02/14/2015   Baseline decreased strength (~3+/5) for forward elevation/ER for using left UE overhead and shoulder level activties without difficulty   Status Revised   PT LONG TERM GOAL #3   Title Patient will improve quickDash score to 20% or less by 02/14/2015 indicating improved functional use with decreased pain in left shoulder   Baseline initial 42% to 50% impairment (current score 36% self perceived disability)   Status Revised   PT LONG TERM GOAL #4   Title Patient will  be independent with home program for strength and pain control for left UE/shoulder by 02/14/2015   Baseline limited knowledge of pain control strategies or progression of exercises for left UE   Status Revised               Plan - 01/08/15 1218    Clinical Impression Statement Patient demonstrated improved motion with decreased pain and was able to dress upper body with less difficulty and pain at end of session. she presented with inflammation lateral left shoulder that responded favorably to US and exercise. She continues with primary limitations of pain and weakness s/p multlipe surgeries and recovering from recent illness and difficulty with controlling diabetes.    Pt will benefit from skilled therapeutic intervention in order to improve on the following deficits Decreased strength;Decreased activity tolerance;Decreased endurance   Rehab Potential Fair   PT Frequency 2x / week   PT Duration 6 weeks   PT Treatment/Interventions Manual techniques;Cryotherapy;Therapeutic exercise;Patient/family education   PT Next Visit Plan Motor control/movement patterning, increase musculature strength, pain control        Problem List There  are no active problems to display for this patient.   Beacher MayBrooks, Marie PT 01/08/2015, 8:04 PM  [place] [facility] [address] [place], [place], [address] Phone: [phone]   Fax:  [phone]  Name: Suzanne Snyder MRN: [id number] Date of Birth: 12-03-1950

## 2015-01-16 ENCOUNTER — Encounter: Payer: Self-pay | Admitting: Physical Therapy

## 2015-01-16 ENCOUNTER — Ambulatory Visit: Attending: Orthopedic Surgery | Admitting: Physical Therapy

## 2015-01-16 DIAGNOSIS — M6281 Muscle weakness (generalized): Secondary | ICD-10-CM | POA: Diagnosis present

## 2015-01-16 DIAGNOSIS — G8929 Other chronic pain: Secondary | ICD-10-CM | POA: Insufficient documentation

## 2015-01-16 DIAGNOSIS — M25512 Pain in left shoulder: Secondary | ICD-10-CM | POA: Diagnosis present

## 2015-01-16 NOTE — Therapy (Signed)
[facility] [address], [place], [address] Phone: [phone]   Fax:  [phone]  Physical Therapy Treatment  Patient Details  Name: Suzanne Snyder MRN: [id number] Date of Birth: 1950/08/01 No Data Recorded  Encounter Date: 01/16/2015      PT End of Session - 01/16/15 1203    Visit Number 37   Number of Visits 42   Date for PT Re-Evaluation 02/14/15   PT Start Time 1120   PT Stop Time 1225   PT Time Calculation (min) 65 min   Activity Tolerance Patient tolerated treatment well;Patient limited by pain   Behavior During Therapy [facility] for tasks assessed/performed      Past Medical History  Diagnosis Date  . Anxiety   . Diabetes mellitus without complication (HCC)   . Osteoporosis     Past Surgical History  Procedure Laterality Date  . 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 Spine surgery N/A 1984    L4-5, not sure of procedure  . Rotator cuff repair Left 2014 and 2015    There were no vitals filed for this visit.  Visit Diagnosis:  Chronic shoulder pain, left  Muscle weakness of left upper extremity      Subjective Assessment - 01/16/15 1124    Subjective Patient reports her left shoulder is more painful today and she cannot raise her arm forward above shoulder level or out to the side without increased grinding and pain. She has been doing more with left UE over the past few weeks with the holidays and is still trying to reach into her microwave etc. Above shoulder level and reports this is difficult and painful and causes grinding in her shoulder joint.   Patient Stated Goals Patient would like to be able to lift, reach with left UE for improved function with daily activities with less pain    Currently in Pain? Yes   Pain Score 6   trying to get comfortable lying down   Pain Location Shoulder   Pain Orientation Left   Pain Descriptors / Indicators Aching   Pain Type Chronic pain    Pain Onset More than a month ago   Pain Frequency Intermittent        Objective: AROM left shoulder in sitting forward elevation to 80 degrees with pain limiting further motion Strength: decreased left shoulder pain external rotation 3-/5 with pain, decreased forward elevation 3-/5 due to pain  Palpation: + tenderness and spasms left upper trapezius, along medial border of scapula and tender anterior/posterior aspect of shoulder       [facility] Adult PT Treatment/Exercise - 01/16/15 1158    Exercises   Exercises Other Exercises   Other Exercises  patient performed exercises with guidance, verbal and tactile cues and demonstration of PT: isometric exercises in sitting for shoulder extension 5 second holds x 10 reps, AAROM ER through mid range in sitting followed by isometric holds x 3 reps with mild resistance and instruction for home: no reaching above shoulder level, perform isometric exercises as instructed, ROM in supine lying only   Modalities   Modalities Ultrasound;Insurance account manager Location left shoulder/mid thoracic spine   Electrical Stimulation Parameters acute pain for anterior posterior shoulder, intensity to sensory level, Russian stim 10/10 cycle, intensity to contraction with patient seated x 20 min   Electrical Stimulation Goals Pain;Neuromuscular facilitation   Ultrasound   Ultrasound Location left  shoulder   Ultrasound Parameters 1MHz 50% pulsed @ 1.4w/cm2 x 10 min. with patient seated anterior/posterior aspect of shoulder joint   Ultrasound Goals Pain  inflammation     Manual therapy: STM right upper trapezius and posterior aspect of left shoulder over muscles spasms: decreased pain, improve soft tissue elasticity  Patient response to treatment: Patient able to raise left UE above shoulder to >120 degrees with less discomfort and difficulty with pain level decreased by 50%, improved ability to perform ER with  improved control and strength with repetition and assistance of therapist, decreased pain in shoulder following modalities to 3/10 and able to raise arm and reach to 90 degrees with much less difficulty, patient verbalized good understanding of home program and modifications to avoid aggravating pain           PT Education - 01/16/15 1202    Education Details HEP continue with scapular control exercises and isometric exercises as tolerated, pain control   Person(s) Educated Patient   Methods Explanation   Comprehension Verbalized understanding             PT Long Term Goals - 01/02/15 1230    PT LONG TERM GOAL #1   Title Patient will improve AROM to 130 degrees left shoulder with mild discomfort for reaching into cabinet or performing hair care by 02/14/2015   Baseline AROM overhead to 120 with increased pain and decreased strength   Status Revised   PT LONG TERM GOAL #2   Title Patient will demonstrate improved strength to 4/5 for left UE forward elevation, ER left UE in order to improve function with overhead tasks by 02/14/2015   Baseline decreased strength (~3+/5) for forward elevation/ER for using left UE overhead and shoulder level activties without difficulty   Status Revised   PT LONG TERM GOAL #3   Title Patient will improve quickDash score to 20% or less by 02/14/2015 indicating improved functional use with decreased pain in left shoulder   Baseline initial 42% to 50% impairment (current score 36% self perceived disability)   Status Revised   PT LONG TERM GOAL #4   Title Patient will  be independent with home program for strength and pain control for left UE/shoulder by 02/14/2015   Baseline limited knowledge of pain control strategies or progression of exercises for left UE   Status Revised               Plan - 01/16/15 1204    Clinical Impression Statement Patient demonstrated decreased pain and improved ability to perform left shoulder elevation to 90 degrees  with less difficulty.  She continues with pain and weakness as limitations to being able to perform daily activities for personal care and household chores and will continue to benefit from physical therapy intervention to address limitations and guide her through appropriate exercises and pain control to achieve best outcome for functional use of her UE.    Pt will benefit from skilled therapeutic intervention in order to improve on the following deficits Decreased strength;Decreased activity tolerance;Decreased endurance   Rehab Potential Fair   PT Frequency 2x / week   PT Duration 6 weeks   PT Treatment/Interventions Manual techniques;Cryotherapy;Therapeutic exercise;Patient/family education   PT Next Visit Plan Motor control/movement patterning, increase musculature strength, pain control   PT Home Exercise Plan modified exercises to isometric and supine lying only, no weights through ROM        Problem List There are no active problems to display for this patient.  Beacher May PT 01/16/2015, 12:57 PM  [place] [facility] [address] [place], [address] Phone: [phone]   Fax:  [phone]  Name: Suzanne Snyder MRN: [id number] Date of Birth: Mar 11, 1950

## 2015-01-23 ENCOUNTER — Ambulatory Visit: Admitting: Physical Therapy

## 2015-01-23 ENCOUNTER — Encounter: Payer: Self-pay | Admitting: Physical Therapy

## 2015-01-23 DIAGNOSIS — M6281 Muscle weakness (generalized): Secondary | ICD-10-CM

## 2015-01-23 DIAGNOSIS — G8929 Other chronic pain: Secondary | ICD-10-CM

## 2015-01-23 DIAGNOSIS — M25512 Pain in left shoulder: Principal | ICD-10-CM

## 2015-01-23 NOTE — Therapy (Signed)
[facility] [facility] [address] [place], [place], [address] Phone: [phone]   Fax:  [phone]  Physical Therapy Treatment  Patient Details  Name: Suzanne LeberDeborah L Snyder MRN: [id number] Date of Birth: [birth year] No Data Recorded  Encounter Date: 01/23/2015      PT End of Session - 01/23/15 1230    Visit Number 38   Number of Visits 42   Date for PT Re-Evaluation 02/14/15   PT Start Time 1128   PT Stop Time 1215   PT Time Calculation (min) 47 min   Activity Tolerance Patient tolerated treatment well;Patient limited by pain   Behavior [facility] assessed/performed      Past Medical History  Diagnosis Date  . Anxiety   . Diabetes mellitus without complication (HCC)   . Osteoporosis     Past Surgical History  Procedure Laterality Date  . 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 Spine surgery N/A 1984    L4-5, not sure of procedure  . Rotator cuff repair Left 2014 and 2015    There were no vitals filed for this visit.  Visit Diagnosis:  Chronic shoulder pain, left  Muscle weakness of left upper extremity      Subjective Assessment - 01/23/15 1131    Subjective Patient reports her left shoulder is about the same as last week and she feels the US and estim. did assist with pain control for a while following PT session.    Limitations Sitting   Currently in Pain? Yes   Pain Score 6   still trying to get comfortable lying down   Pain Orientation Left   Pain Descriptors / Indicators Aching   Pain Type Chronic pain   Pain Onset More than a month ago   Pain Frequency Intermittent        Objective: AAROM left shoulder forward elevation on arrival 0-115 with effort and pain end range Palpation: point tender with Trp/spasm in mid upper trapezius left shoulder, point tender lateral aspect left shoulder        [facility] Adult PT Treatment/Exercise - 01/23/15 1137    Exercises   Exercises  Other Exercises   Other Exercises  sitting on treatment table; isometric forward elevation and rotations in neutral position 10 reps each direction with 5 second holds, standing at wall for scapular adduction performed with verbal cues and demonstration   Modalities   Modalities Ultrasound;Insurance account managerlectrical Stimulation   Electrical Stimulation   Electrical Stimulation Location left shoulder/mid thoracic spine   Electrical Stimulation Parameters high volt continuous for muscle spasms, russian stim. applied along medial border of scapula for muscle stimulation intensity to contraction   Electrical Stimulation Goals Pain;Neuromuscular facilitation   Ultrasound   Ultrasound Location left shoulder   Ultrasound Parameters 3MHz pulsed @ 20% static 0.8w/cm2 x 3 min. over upper trapezius spasm, 1MHz pulsed 50% 1.4w/cm2 applied to left anterior and posterior shoulder   Ultrasound Goals Pain  inflammation      soft tissue mobilization left upper trapezius and left shoulder girdle to improve soft tissue elasticity and decrease pain/spasms   Patient response to treatment: able to tolerate increased resistance with isometric exercises for shoulder ER with repetition, required verbal cuing to perform exercises with correct alignment and technique, decreased spasm to mild and pain to 3/10 in left shoulder and upper trapezius following US/estim.            PT Education - 01/23/15 1225  Education provided Yes   Education Details HEP to continue with scapular control/posture exercises and isometric exercise for left shoulder as instructed   Person(s) Educated Patient   Methods Explanation;Demonstration;Verbal cues   Comprehension Verbalized understanding;Returned demonstration;Verbal cues required             PT Long Term Goals - 01/02/15 1230    PT LONG TERM GOAL #1   Title Patient will improve AROM to 130 degrees left shoulder with mild discomfort for reaching into cabinet or performing hair care  by 02/14/2015   Baseline AROM overhead to 120 with increased pain and decreased strength   Status Revised   PT LONG TERM GOAL #2   Title Patient will demonstrate improved strength to 4/5 for left UE forward elevation, ER left UE in order to improve function with overhead tasks by 02/14/2015   Baseline decreased strength (~3+/5) for forward elevation/ER for using left UE overhead and shoulder level activties without difficulty   Status Revised   PT LONG TERM GOAL #3   Title Patient will improve quickDash score to 20% or less by 02/14/2015 indicating improved functional use with decreased pain in left shoulder   Baseline initial 42% to 50% impairment (current score 36% self perceived disability)   Status Revised   PT LONG TERM GOAL #4   Title Patient will  be independent with home program for strength and pain control for left UE/shoulder by 02/14/2015   Baseline limited knowledge of pain control strategies or progression of exercises for left UE   Status Revised               Plan - 01/23/15 1230    Clinical Impression Statement Patient with decreased pain following treatment. She conintues with weakness and pain as limiting factors and requires guidance and cuing to perform exercises with correct technique and positioning.    Pt will benefit from skilled therapeutic intervention in order to improve on the following deficits Decreased strength;Decreased activity tolerance;Decreased endurance   Rehab Potential Fair   PT Frequency 2x / week   PT Duration 6 weeks   PT Treatment/Interventions Manual techniques;Cryotherapy;Therapeutic exercise;Patient/family education;Electrical Stimulation;Ultrasound   PT Next Visit Plan Motor control/movement patterning, increase musculature strength, pain control   PT Home Exercise Plan continue with isometric exercises and pain control        Problem List There are no active problems to display for this patient.   Beacher May PT 01/23/2015, 10:43  PM  [place] [facility] SPORTS MEDICINE [address] [place], [address] Phone: [phone]   Fax:  [phone]  Name: Suzanne Snyder MRN: [id number] Date of Birth: [birth year]

## 2015-01-30 ENCOUNTER — Ambulatory Visit: Admitting: Physical Therapy

## 2015-01-30 DIAGNOSIS — M6281 Muscle weakness (generalized): Secondary | ICD-10-CM

## 2015-01-30 DIAGNOSIS — G8929 Other chronic pain: Secondary | ICD-10-CM

## 2015-01-30 DIAGNOSIS — M25512 Pain in left shoulder: Secondary | ICD-10-CM | POA: Diagnosis not present

## 2015-01-30 NOTE — Therapy (Signed)
[facility] [address] [place], [address] Phone: [phone]   Fax:  [phone]  Physical Therapy Treatment  Patient Details  Name: Suzanne Snyder MRN: [id number] Date of Birth: 05-07-1950 No Data Recorded  Encounter Date: 01/30/2015      PT End of Session - 01/30/15 1220    Visit Number 39   Number of Visits 42   Date for PT Re-Evaluation 02/14/15   PT Start Time 1119   PT Stop Time 1215   PT Time Calculation (min) 56 min   Activity Tolerance Patient tolerated treatment well;Patient limited by pain   Behavior During Therapy [facility] for tasks assessed/performed      Past Medical History  Diagnosis Date  . Anxiety   . Diabetes mellitus without complication (HCC)   . Osteoporosis     Past Surgical History  Procedure Laterality Date  . 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 Spine surgery N/A 1984    L4-5, not sure of procedure  . Rotator cuff repair Left 2014 and 2015    There were no vitals filed for this visit.  Visit Diagnosis:  Chronic shoulder pain, left  Muscle weakness of left upper extremity      Subjective Assessment - 01/30/15 1120    Subjective Left shoulder has been "noisy" and is resistant to reaching and perform  normal motions and is still weak in ER with pain. She feels the [place] and estim has been beneficial with pain control.    Patient Stated Goals Patient would like to be able to lift, reach with left UE for improved function with daily activities with less pain    Currently in Pain? Yes   Pain Score 5    Pain Location Shoulder   Pain Orientation Left   Pain Descriptors / Indicators Aching   Pain Type Chronic pain   Pain Onset More than a month ago   Pain Frequency Intermittent        Objective: AAROM: left shoulder in sitting 0-115 with mild crepitus felt in shoulder, marked improvement from previous session AROM: left shoulder forward elevation to ~90  degrees with pain limiting further motion Strength: marked decreased ER strength left shoulder with increased pain reported in left shoulder       [facility] Adult PT Treatment/Exercise - 01/30/15 1126    Exercises   Exercises Other Exercises   Other Exercises  patient performed exercises with guidance, verbal and tactile cues and demonstration of PT: Supine lying AAROM left shoulder forward elevation, ER off trunk with assistance and with holding 1# weight with short arc of motion, prone lying shoulder row with 1# weight 2 x 10, shoulder extension to side of trunk 2 x 10, prone flexion short arc as tolerated with assistance of PT to control motion x 10 reps, sitting AAROM forward elevation to forehead (~90 degrees left shoulder) while holding 1# weight between hands x 10 reps with good alignment and no hiking of left shoulder   Modalities   Modalities Electrical Stimulation   Electrical Stimulation   Electrical Stimulation Location and parameters left shoulder/mid thoracic spine high volt continuous for muscle spasms applied to left upper trapezius spasm and lateral aspect of shoulder , russian stim. applied along medial border of scapula and lower trapezius muscles 10/10 cycle for muscle stimulation intensity to contraction   Electrical Stimulation Goals Pain;Neuromuscular facilitation  Patient response to treatment: improved ability to raise arm overhead with assistance of opposite arm without increased grinding in shoulder and improved control with verbal and tactile cuing, full contraction achieved with estim to scapular muscles and decreased spasms and pain reported to 3-4/10 at end of session, patient required assistance and cuing to perform exercises with correct alignment and technique and intensity           PT Education - 01/30/15 1127    Education provided Yes   Education Details HEP to continue with supine strengthening exercises and scapular control and isometric  exercises wtih addition of prone row, extension and flexion as instructed   Person(s) Educated Patient   Methods Explanation;Demonstration;Verbal cues;handout   Comprehension Verbalized understanding;Returned demonstration;Verbal cues required             PT Long Term Goals - 01/02/15 1230    PT LONG TERM GOAL #1   Title Patient will improve AROM to 130 degrees left shoulder with mild discomfort for reaching into cabinet or performing hair care by 02/14/2015   Baseline AROM overhead to 120 with increased pain and decreased strength   Status Revised   PT LONG TERM GOAL #2   Title Patient will demonstrate improved strength to 4/5 for left UE forward elevation, ER left UE in order to improve function with overhead tasks by 02/14/2015   Baseline decreased strength (~3+/5) for forward elevation/ER for using left UE overhead and shoulder level activties without difficulty   Status Revised   PT LONG TERM GOAL #3   Title Patient will improve quickDash score to 20% or less by 02/14/2015 indicating improved functional use with decreased pain in left shoulder   Baseline initial 42% to 50% impairment (current score 36% self perceived disability)   Status Revised   PT LONG TERM GOAL #4   Title Patient will  be independent with home program for strength and pain control for left UE/shoulder by 02/14/2015   Baseline limited knowledge of pain control strategies or progression of exercises for left UE   Status Revised               Plan - 01/30/15 1221    Clinical Impression Statement Patient demonstrated improved ability to raise left UE to shoulder level with less effort and with improved mechanics without hiking shoulder following exercises and explanation. She continues with decreased srtrength and increased pain that limit full functional use of her arm and will therefore benefit from additional physical therapy intervention.    Pt will benefit from skilled therapeutic intervention in order to  improve on the following deficits Decreased strength;Decreased activity tolerance;Decreased endurance   Rehab Potential Fair   PT Frequency 2x / week   PT Duration 6 weeks   PT Treatment/Interventions Manual techniques;Cryotherapy;Therapeutic exercise;Patient/family education;Electrical Stimulation;Ultrasound   PT Next Visit Plan Motor control/movement patterning, increase musculature strength, pain control   PT Home Exercise Plan add prone exercises        Problem List There are no active problems to display for this patient.   Beacher May PT 01/30/2015, 5:36 PM  [facility] SPORTS MEDICINE [address] [place], [address] Phone: [phone]   Fax:  [phone]  Name: Suzanne Snyder MRN: [id number] Date of Birth: Dec 31, 1950

## 2015-02-06 ENCOUNTER — Ambulatory Visit: Admitting: Physical Therapy

## 2015-02-08 ENCOUNTER — Ambulatory Visit: Admitting: Physical Therapy

## 2015-02-13 ENCOUNTER — Encounter: Payer: Self-pay | Admitting: Physical Therapy

## 2015-02-13 ENCOUNTER — Ambulatory Visit: Attending: Orthopedic Surgery | Admitting: Physical Therapy

## 2015-02-13 DIAGNOSIS — M25512 Pain in left shoulder: Secondary | ICD-10-CM | POA: Insufficient documentation

## 2015-02-13 DIAGNOSIS — M6281 Muscle weakness (generalized): Secondary | ICD-10-CM | POA: Diagnosis present

## 2015-02-13 DIAGNOSIS — G8929 Other chronic pain: Secondary | ICD-10-CM | POA: Diagnosis present

## 2015-02-13 NOTE — Therapy (Signed)
[place] [facility] [address] [place], [address] Phone: [phone]   Fax:  [phone]  Physical Therapy Treatment  Patient Details  Name: Suzanne Snyder MRN: [id number] Date of Birth: July 06, 1950 No Data Recorded  Encounter Date: 02/13/2015      PT End of Session - 02/13/15 1133    Visit Number 40   Number of Visits 42   Date for PT Re-Evaluation 02/14/15   PT Start Time 1120   PT Stop Time 1200   PT Time Calculation (min) 40 min   Activity Tolerance Patient tolerated treatment well;Patient limited by pain   Behavior During Therapy [facility] for tasks assessed/performed      Past Medical History  Diagnosis Date  . Anxiety   . Diabetes mellitus without complication (HCC)   . Osteoporosis     Past Surgical History  Procedure Laterality Date  . 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 Spine surgery N/A 1984    L4-5, not sure of procedure  . Rotator cuff repair Left 2014 and 2015    There were no vitals filed for this visit.  Visit Diagnosis:  Chronic shoulder pain, left  Muscle weakness of left upper extremity      Subjective Assessment - 02/13/15 1127    Subjective Patient reports she saw Dr. Olga Millers yesterday and she reports she is looking at future surgery for either a graft or TSA. She is to continue with therapy treatment until she makes her decision.    Limitations House hold activities   Patient Stated Goals Patient would like to be able to lift, reach with left UE for improved function with daily activities with less pain    Currently in Pain? Yes   Pain Score 5    Pain Location Shoulder   Pain Orientation Left   Pain Descriptors / Indicators Aching   Pain Type Chronic pain   Pain Onset More than a month ago   Pain Frequency Intermittent       Objective: Strength left shoulder forward elevation 3+/5, ER 3/5 with difficulty/pain with resistance end range, IR 4/5, extension 4/5,  lower trapezius 4-/5 Palpation; mild spasms left upper trapezius muscle with tenderness on palpation        [facility] Adult PT Treatment/Exercise - 02/13/15 1130    Exercises   Exercises Other Exercises   Other Exercises  patient performed exercises with guidance, verbal and tactile cues and demonstration of PT: supine on treatment table; isometric forward elevation and rotations in neutral position 2 sets of 10 reps each direction with 5 second holds, seated Omega cable machine: scapular rows with assistance through short arc 5# x 10 reps, straight arm pull downs with 10# x 10 reps, shoulder elevation and depression for scapular control in standing and side lying with guidance and tactile cues, working on controlled motion, decreased hiking of shoulder                              Patient response to treatment: improved control with all exercises with isometric exercises with repetition without increased grinding of shoulder or increased pain noted throughout session. She demonstrated improved mechanics with forward elevation with decreased hiking of shoulder following demonstration and with repetition           PT Education - 02/13/15 1200    Education provided Yes   Education Details HEP isometric exercises  for left shoulder flexion, rotations and extension, scapular adduction, educated in shoulder stabilizers and deltoid function relating to shoulder movement   Person(s) Educated Patient   Methods Explanation;Demonstration;Verbal cues   Comprehension Verbalized understanding;Returned demonstration;Verbal cues required             PT Long Term Goals - 01/02/15 1230    PT LONG TERM GOAL #1   Title Patient will improve AROM to 130 degrees left shoulder with mild discomfort for reaching into cabinet or performing hair care by 02/14/2015   Baseline AROM overhead to 120 with increased pain and decreased strength   Status Revised   PT LONG TERM GOAL #2   Title Patient will  demonstrate improved strength to 4/5 for left UE forward elevation, ER left UE in order to improve function with overhead tasks by 02/14/2015   Baseline decreased strength (~3+/5) for forward elevation/ER for using left UE overhead and shoulder level activties without difficulty   Status Revised   PT LONG TERM GOAL #3   Title Patient will improve quickDash score to 20% or less by 02/14/2015 indicating improved functional use with decreased pain in left shoulder   Baseline initial 42% to 50% impairment (current score 36% self perceived disability)   Status Revised   PT LONG TERM GOAL #4   Title Patient will  be independent with home program for strength and pain control for left UE/shoulder by 02/14/2015   Baseline limited knowledge of pain control strategies or progression of exercises for left UE   Status Revised               Plan - 02/13/15 1200    Clinical Impression Statement Patient was able to perform isometric exercises without increased grinding or pain in left shoudler. She continues with pain and weakness in left shoulder/UE and will require additional physical therapy intervention to prepare for future surgery.    Pt will benefit from skilled therapeutic intervention in order to improve on the following deficits Decreased strength;Decreased activity tolerance;Decreased endurance   Rehab Potential Fair   PT Frequency 2x / week   PT Duration 6 weeks   PT Treatment/Interventions Manual techniques;Cryotherapy;Therapeutic exercise;Patient/family education;Electrical Stimulation;Ultrasound   PT Next Visit Plan Motor control/movement patterning, increase musculature strength, pain control        Problem List There are no active problems to display for this patient.   Carl Best 02/14/2015, 3:56 PM  [place] [facility] PH[facility]TS [facility]IC[facility] [address] [place], [address] Phone: [phone]   Fax:  [phone]  Name: Suzanne Snyder MRN: [id number] Date of Birth: 03/22/1950

## 2015-02-20 ENCOUNTER — Encounter: Payer: Self-pay | Admitting: Physical Therapy

## 2015-02-20 ENCOUNTER — Encounter: Admitting: Physical Therapy

## 2015-02-20 ENCOUNTER — Ambulatory Visit: Admitting: Physical Therapy

## 2015-02-20 DIAGNOSIS — M25512 Pain in left shoulder: Secondary | ICD-10-CM | POA: Diagnosis not present

## 2015-02-20 DIAGNOSIS — M6281 Muscle weakness (generalized): Secondary | ICD-10-CM

## 2015-02-20 DIAGNOSIS — G8929 Other chronic pain: Secondary | ICD-10-CM

## 2015-02-21 NOTE — Therapy (Signed)
[place] [facility]TS [facility]ICINE [address] [place], [address] Phone: [phone]   Fax:  [phone]  Physical Therapy Treatment  Patient Details  Name: Suzanne Snyder MRN: [id number] Date of Birth: 07/07/1950 No Data Recorded  Encounter Date: 02/20/2015      PT End of Session - 02/20/15 1443    Visit Number 41   Number of Visits 50   Date for PT Re-Evaluation 04/17/15   PT Start Time 1438   PT Stop Time 1525   PT Time Calculation (min) 47 min   Activity Tolerance Patient tolerated treatment well;Patient limited by pain   Behavior During Therapy [facility] for tasks assessed/performed      Past Medical History  Diagnosis Date  . Anxiety   . Diabetes mellitus without complication (HCC)   . Osteoporosis     Past Surgical History  Procedure Laterality Date  . 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 Spine surgery N/A 1984    L4-5, not sure of procedure  . Rotator cuff repair Left 2014 and 2015    There were no vitals filed for this visit.  Visit Diagnosis:  Chronic shoulder pain, left - Plan: PT plan of care cert/re-cert  Muscle weakness of left upper extremity - Plan: PT plan of care cert/re-cert      Subjective Assessment - 02/20/15 1439    Subjective Patient reports she is "hurting all over".  She is having squeeking noise in left shoulder with moving her joint.    Pertinent History Patient reports history of pain in left soulder prior to 2014 with subsequent surgery, physical therapy and re repair in 2015. She has never fully recovered from the surgeries and is now hoping to avoid another surgery. She has not had an MRI recently.    Patient Stated Goals Patient would like to be able to lift, reach with left UE for improved function with daily activities with less pain    Currently in Pain? Yes   Pain Score 7    Pain Location Shoulder   Pain Orientation Left   Pain Descriptors / Indicators Aching   Pain  Type Chronic pain   Pain Onset More than a month ago   Pain Frequency Intermittent       Objective: AROM: left shoulder sitting 100 degrees flexion Strength: static hold at mid range 4-/5 for flexion, ER, IR left shoulder Palpation: + spasms along left side cervical spine paraspinal muscles and upper trapezius muscles       [facility] Adult PT Treatment/Exercise - 02/20/15 1442    Exercises   Exercises Other Exercises   Other Exercises  patient performed exercises with guidance, verbal and tactile cues and demonstration of PT: Supine lying rhythmic stabilization 90 degrees forward elevation 3 sets x 10 reps, ER/IR in neutral position of shoulder 3 x 10 reps, side lying scapula protraction/retraction, AAROM in standing using UE ranger for forward and back and ER/IR 2 x 1 min.            Manual Therapy: STM left cervical spine paraspinal muscles, upper trapezius, side lying upper trapezius release 3 x 15-20 seconds, scapula mobilization lateral distraction  Patient response to treatment: improved ability to perform exercises with minimal grinding/pain, decreased spasms with STM and upper trapezius release            PT Education - 02/20/15 1442    Education provided Yes   Education Details HEP for isometric  and scapular stabilization exercises   Person(s) Educated Patient   Methods Explanation;Verbal cues   Comprehension Verbalized understanding;Returned demonstration;Verbal cues required             PT Long Term Goals - 02/20/15 1600    PT LONG TERM GOAL #1   Title Patient will improve AROM to 120 degrees left shoulder with mild discomfort for reaching into cabinet or performing hair care by 04/17/2015   Baseline AROM overhead to 100 with increased pain and decreased strength   Status Revised   PT LONG TERM GOAL #2   Title Patient will improve quickDash score to 25% or less by 04/17/2015 indicating improved functional use with decreased pain in left shoulder   Baseline  initial 42% to 50% impairment (current score 36% self perceived disability)   Status Revised   PT LONG TERM GOAL #3   Title Patient will  be independent with home program for strength and pain control for left UE/shoulder by 04/17/2015   Baseline limited knowledge of pain control strategies or progression of exercises for left UE   Status Revised               Plan - 02/20/15 1600    Clinical Impression Statement Patient is progressing slowly due to continued pain and weakness in left shoulder. She is having decreased pain and grinding in her shoulder with treatment and will benefit from additional physical therapy intervention to further improve strength as she contemplates additional shoulder surgery.    Pt will benefit from skilled therapeutic intervention in order to improve on the following deficits Decreased strength;Decreased activity tolerance;Decreased endurance   Rehab Potential Fair   Clinical Impairments Affecting Rehab Potential multiple surgeries left shoulder for rotator cuff tears without complete return to full function following either surgery   PT Frequency 1x / week   PT Duration 8 weeks   PT Treatment/Interventions Manual techniques;Cryotherapy;Therapeutic exercise;Patient/family education;Electrical Stimulation;Ultrasound   PT Next Visit Plan Motor control/movement patterning, increase musculature strength, pain control        Problem List There are no active problems to display for this patient.   Beacher May PT 02/21/2015, 10:13 PM  [address] [facility] PHYSICAL AND SPORTS MEDICINE [address] [place], [address] Phone: [phone]   Fax:  [phone]  Name: Suzanne Snyder MRN: [id number] Date of Birth: [birth year]

## 2015-02-27 ENCOUNTER — Encounter: Admitting: Physical Therapy

## 2015-02-27 ENCOUNTER — Encounter: Payer: Self-pay | Admitting: Physical Therapy

## 2015-02-27 ENCOUNTER — Ambulatory Visit: Admitting: Physical Therapy

## 2015-02-27 DIAGNOSIS — M25512 Pain in left shoulder: Principal | ICD-10-CM

## 2015-02-27 DIAGNOSIS — M6281 Muscle weakness (generalized): Secondary | ICD-10-CM

## 2015-02-27 DIAGNOSIS — G8929 Other chronic pain: Secondary | ICD-10-CM

## 2015-02-27 NOTE — Therapy (Signed)
[facility]ORTS MEDICINE [address] [place], [address] Phone: [phone]   Fax:  [phone]  Physical Therapy Treatment  Patient Details  Name: Suzanne Snyder MRN: [id number] Date of Birth: 1950-04-12 No Data Recorded  Encounter Date: 02/27/2015      PT End of Session - 02/27/15 1530    Visit Number 42   Number of Visits 50   Date for PT Re-Evaluation 04/17/15   PT Start Time 1445   PT Stop Time 1535   PT Time Calculation (min) 50 min   Activity Tolerance Patient tolerated treatment well;Patient limited by pain   Behavior During Therapy [facility] for tasks assessed/performed      Past Medical History  Diagnosis Date  . Anxiety   . Diabetes mellitus without complication (HCC)   . Osteoporosis     Past Surgical History  Procedure Laterality Date  . 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 Spine surgery N/A 1984    L4-5, not sure of procedure  . Rotator cuff repair Left 2014 and 2015    There were no vitals filed for this visit.  Visit Diagnosis:  Chronic shoulder pain, left  Muscle weakness of left upper extremity      Subjective Assessment - 02/27/15 1453    Subjective Patient reports she is having pain in left shoulder with less hurting "all over". She is getting better and noticing less difficulty with daily chores involving left UE.    Pertinent History     Limitations House hold activities   Patient Stated Goals Patient would like to be able to lift, reach with left UE for improved function with daily activities with less pain    Currently in Pain? Yes   Pain Score 5    Pain Location Shoulder   Pain Orientation Left   Pain Descriptors / Indicators Aching   Pain Type Chronic pain   Pain Onset More than a month ago   Pain Frequency Intermittent        Objective: AROM: left shoulder sitting 120 degrees flexion with mild discomfort, no grinding Palpation: + spasms along left side  cervical spine paraspinal muscles and upper trapezius muscles        [facility] Adult PT Treatment/Exercise - 02/27/15 1500    Exercises   Exercises Other Exercises   Other Exercises  patient performed exercises with guidance, verbal and tactile cues and demonstration of PT: Supine lying rhythmic stabilization 90 degrees forward elevation 3 sets x 10 reps, ER/IR in neutral position of shoulder 3 x 10 reps, side lying scapula protraction/retraction, prone lying scapular row and shoulder extension to side short arc with 2# weight, full ROM without weight, AAROM in standing using UE ranger for forward and back and ER/IR 2 x 1 min. While holding a 2# weight                Manual Therapy: STM left cervical spine paraspinal muscles, upper trapezius, side lying upper trapezius release 3 x 15-20 seconds, scapula mobilization lateral distraction  Patient response to treatment: improved ability to perform exercises with minimal to no grinding/pain, decreased spasms with STM and upper trapezius release which allowed patient to exercise with minimal to no pain/grinding        PT Education - 02/27/15 1528    Education provided Yes   Education Details HEP prone row, shoulder extension with verbal cues   Person(s) Educated Patient   Methods Explanation;Demonstration;Verbal  cues   Comprehension Verbalized understanding;Returned demonstration;Verbal cues required             PT Long Term Goals - 02/20/15 1600    PT LONG TERM GOAL #1   Title Patient will improve AROM to 120 degrees left shoulder with mild discomfort for reaching into cabinet or performing hair care by 04/17/2015   Baseline AROM overhead to 100 with increased pain and decreased strength   Status Revised   PT LONG TERM GOAL #2   Title Patient will improve quickDash score to 25% or less by 04/17/2015 indicating improved functional use with decreased pain in left shoulder   Baseline initial 42% to 50% impairment (current score 36% self  perceived disability)   Status Revised   PT LONG TERM GOAL #3   Title Patient will  be independent with home program for strength and pain control for left UE/shoulder by 04/17/2015   Baseline limited knowledge of pain control strategies or progression of exercises for left UE   Status Revised               Plan - 02/27/15 1535    Clinical Impression Statement Patient was able to tolerate more exercises without grinding in left shoulder for most of the exercises. She is progressing with strength and ROM slowly due to history of multiple shoulder surgeries.    Pt will benefit from skilled therapeutic intervention in order to improve on the following deficits Decreased strength;Decreased activity tolerance;Decreased endurance   Rehab Potential Fair   PT Frequency 1x / week   PT Duration 8 weeks   PT Treatment/Interventions Manual techniques;Cryotherapy;Therapeutic exercise;Patient/family education;Electrical Stimulation;Ultrasound   PT Next Visit Plan Motor control/movement patterning, increase musculature strength, pain control        Problem List There are no active problems to display for this patient.   Beacher May PT 02/27/2015, 7:34 PM  [place] [facility] PHYSICAL AND SPORTS MEDICINE [address] [place], [address] Phone: [phone]   Fax:  [phone]  Name: Suzanne Snyder MRN: [id number] Date of Birth: 17-Sep-1950

## 2015-03-08 ENCOUNTER — Encounter: Payer: Self-pay | Admitting: Physical Therapy

## 2015-03-08 ENCOUNTER — Ambulatory Visit: Admitting: Physical Therapy

## 2015-03-08 DIAGNOSIS — M6281 Muscle weakness (generalized): Secondary | ICD-10-CM

## 2015-03-08 DIAGNOSIS — M25512 Pain in left shoulder: Secondary | ICD-10-CM | POA: Diagnosis not present

## 2015-03-08 DIAGNOSIS — G8929 Other chronic pain: Secondary | ICD-10-CM

## 2015-03-08 NOTE — Therapy (Signed)
[place] [facility] [address], [place], [address] Phone: [phone]   Fax:  [phone]  Physical Therapy Treatment  Patient Details  Name: Suzanne Snyder MRN: [id number] Date of Birth: 04-Feb-1950 No Data Recorded  Encounter Date: 03/08/2015      PT End of Session - 03/08/15 1102    Visit Number 43   Number of Visits 50   Date for PT Re-Evaluation 04/17/15   PT Start Time 1048   PT Stop Time 1133   PT Time Calculation (min) 45 min   Activity Tolerance Patient tolerated treatment well;Patient limited by pain   Behavior During Therapy [facility] for tasks assessed/performed      Past Medical History  Diagnosis Date  . Anxiety   . Diabetes mellitus without complication (HCC)   . Osteoporosis     Past Surgical History  Procedure Laterality Date  . 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 Spine surgery N/A 1984    L4-5, not sure of procedure  . Rotator cuff repair Left 2014 and 2015    There were no vitals filed for this visit.  Visit Diagnosis:  Chronic shoulder pain, left  Muscle weakness of left upper extremity      Subjective Assessment - 03/08/15 1058    Subjective Patient reports she  burned her left hand yesterday while cooking and she is taking care of it at home. She is still having pain and grindig in her left shoulder with increased use. She is having less pain and grinding in her shoulder today than last week.    Currently in Pain? Yes   Pain Score 3    Pain Location Shoulder   Pain Orientation Left   Pain Descriptors / Indicators Aching   Pain Type Chronic pain   Pain Onset More than a month ago   Pain Frequency Intermittent       Objective: AROM: left shoulder sitting: flexion to 120, AAROM to 140 degrees with mild discomfort reported Strength: left shoulder ER 3+/5, flexion 3+/5 Palpation: + spasms in left upper trapezius muscle       [facility] Adult PT Treatment/Exercise -  03/08/15 1055    Exercises   Exercises Other Exercises   Other Exercises  supine lying: RS with right UE at 90 degrees flexion 3 x 10 reps with moderate resistance,  shoulder punch to ceiling with 2# dumbell 2 x 10 reps, ER with 2# weight with assistance 2 x 5 reps, side lying ER, hold at end range, lower slowly with graded manual resistnace x 10 reps, upper trapezius release 3 x 30 seconds          Manual Therapy   Manual Therapy Joint mobilization;Soft tissue mobilization   Manual therapy comments + spasms with decreased soft tissue elasticity left upper trapezius   Joint Mobilization scapulo thoracic mobilization left UE with patient positioned in right side lying    Soft tissue mobilization STM performed by therapist to left upper trapezius and subscapularis muscles with patient side lying      Patient response to treatment: patient demonstrated improved control with exercises with minimal assistance and guidance of therapist, fatigued with ER in side lying and supine exercises due to weakness in let shoulder; improved soft tissue elasticity in left upper trapezius and shoulder following STM which allowed her to perform exercises with less difficulty          PT Education - 03/08/15 1136  Education provided Yes   Education Details HEP for improving ER strength left shoulder and scapular control to prepare for future surgery   Person(s) Educated Patient   Methods Explanation   Comprehension Verbalized understanding             PT Long Term Goals - 02/20/15 1600    PT LONG TERM GOAL #1   Title Patient will improve AROM to 120 degrees left shoulder with mild discomfort for reaching into cabinet or performing hair care by 04/17/2015   Baseline AROM overhead to 100 with increased pain and decreased strength   Status Revised   PT LONG TERM GOAL #2   Title Patient will improve quickDash score to 25% or less by 04/17/2015 indicating improved functional use with decreased pain in  left shoulder   Baseline initial 42% to 50% impairment (current score 36% self perceived disability)   Status Revised   PT LONG TERM GOAL #3   Title Patient will  be independent with home program for strength and pain control for left UE/shoulder by 04/17/2015   Baseline limited knowledge of pain control strategies or progression of exercises for left UE   Status Revised               Plan - 03/08/15 1140    Clinical Impression Statement Patient is improving ability to perform exercises with decreased grinding and pain in left shoulder. She requires less verbal cuing demonstrating carryover to home program. she has limitations of pain and decreased strength and will continue to benefit from weekly therapy in order to further strengthen ER and forward flexion    Pt will benefit from skilled therapeutic intervention in order to improve on the following deficits Decreased strength;Decreased activity tolerance;Decreased endurance   Clinical Impairments Affecting Rehab Potential multiple surgeries left shoulder for rotator cuff tears without complete return to full function following either surgery   PT Frequency 1x / week   PT Duration 8 weeks   PT Treatment/Interventions Manual techniques;Cryotherapy;Therapeutic exercise;Patient/family education;Electrical Stimulation;Ultrasound   PT Next Visit Plan Motor control/movement patterning, increase musculature strength, pain control        Problem List There are no active problems to display for this patient.   Beacher May PT 03/08/2015, 10:27 PM  [place] [facility] PHYSICAL [facility] SPORTS MEDICINE [address] [place], [address] Phone: [phone]   Fax:  [phone]  Name: TYAH ACORD MRN: [id number] Date of Birth: [birth year]

## 2015-03-14 ENCOUNTER — Ambulatory Visit: Attending: Orthopedic Surgery | Admitting: Physical Therapy

## 2015-03-14 ENCOUNTER — Encounter: Payer: Self-pay | Admitting: Physical Therapy

## 2015-03-14 DIAGNOSIS — M6281 Muscle weakness (generalized): Secondary | ICD-10-CM | POA: Insufficient documentation

## 2015-03-14 DIAGNOSIS — G8929 Other chronic pain: Secondary | ICD-10-CM | POA: Insufficient documentation

## 2015-03-14 DIAGNOSIS — M25512 Pain in left shoulder: Secondary | ICD-10-CM | POA: Diagnosis present

## 2015-03-14 NOTE — Therapy (Signed)
[facility] [address], [place], [address] Phone: [phone]   Fax:  [phone]  Physical Therapy Treatment  Patient Details  Name: Suzanne Snyder MRN: [id number] Date of Birth: 04-07-1950 No Data Recorded  Encounter Date: 03/14/2015      PT End of Session - 03/14/15 1034    Visit Number 44   Number of Visits 50   Date for PT Re-Evaluation 04/17/15   PT Start Time 1033   PT Stop Time 1120   PT Time Calculation (min) 47 min   Activity Tolerance Patient tolerated treatment well;Patient limited by pain   Behavior During Therapy [facility] for tasks assessed/performed      Past Medical History  Diagnosis Date  . Anxiety   . Diabetes mellitus without complication (HCC)   . Osteoporosis     Past Surgical History  Procedure Laterality Date  . 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 Spine surgery N/A 1984    L4-5, not sure of procedure  . Rotator cuff repair Left 2014 and 2015    There were no vitals filed for this visit.  Visit Diagnosis:  Chronic shoulder pain, left  Muscle weakness of left upper extremity      Subjective Assessment - 03/14/15 1035    Subjective Patient reports she is able to move left shoulder with less pain and difficulty this week. 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    Limitations House hold activities   Patient Stated Goals Patient would like to be able to lift, reach with left UE for improved function with daily activities with less pain    Currently in Pain? Yes   Pain Score 3    Pain Location Shoulder   Pain Orientation Left   Pain Descriptors / Indicators Aching   Pain Type Chronic pain   Pain Onset More than a month ago   Pain Frequency Intermittent       Objective: AROM: left shoulder sitting: flexion to 120 with report of increased discomfort/pain end range Strength: left shoulder  ER 3+/5, flexion 3+/5 Palpation: + spasms in left upper trapezius muscle, suboccipital and cervical spine paraspinal muscles       [facility] Adult PT Treatment/Exercise - 03/14/15 1038    Exercises   Exercises Other Exercises   Other Exercises  patient performed exercises with guidance, verbal and tactile cues and demonstration of PT: supine lying: RS with right UE at 90 degrees flexion 3 x 10 reps with moderate resistance, shoulder punch to ceiling without weight 2 x 10 reps,    Cervical spine isometric extension with tactile and verbal cues x 5 reps, AAROM rotation and lateral flexion x 5 reps with tactile cues and guidance standing UE ranger with 3# dumbbell 2 x 15 reps for flexion and rotations,  seated scapular retraction with tactile cues 3 x 10 reps,     shoulder elevation and depression (with mild manual resistance for depression) 3 x 10 reps,   isometric ER/IR with mild resistance given 1 x 10 reps, attempted resistive band ER (patient unable to perform),     scapular retraction performed in sitting with red resistive band 2 x 15 reps          Manual Therapy   Manual Therapy Soft tissue mobilization   Manual therapy comments + spasms with decreased soft tissue elasticity left upper trapezius,  cervical spine paraspinal muscles bilaterally and suboccipital muscles bilaterally       Soft tissue mobilization STM performed by therapist to left upper trapezius , cervical spine and suboccipital muscles with patient supine lying        Patient response to treatment: patient demonstrated improved control with exercises with minimal assistance and guidance of therapist,increased shoulder pain with resisted ER today; improved soft tissue elasticity in left upper trapezius, cervical spine and shoulder following STM and improved AAROM forward elevation to 140 with improved flexibility and mild to no pain at end of session         PT Education - 03/14/15 1200    Education provided Yes    Education Details HEP: continue with ROM, modified strenghtening to avoid grinding of shoulder, concentrate on scapular stabilization   Person(s) Educated Patient   Methods Explanation;Demonstration;Verbal cues;Tactile cues   Comprehension Verbalized understanding;Returned demonstration;Verbal cues required             PT Long Term Goals - 02/20/15 1600    PT LONG TERM GOAL #1   Title Patient will improve AROM to 120 degrees left shoulder with mild discomfort for reaching into cabinet or performing hair care by 04/17/2015   Baseline AROM overhead to 100 with increased pain and decreased strength   Status Revised   PT LONG TERM GOAL #2   Title Patient will improve quickDash score to 25% or less by 04/17/2015 indicating improved functional use with decreased pain in left shoulder   Baseline initial 42% to 50% impairment (current score 36% self perceived disability)   Status Revised   PT LONG TERM GOAL #3   Title Patient will  be independent with home program for strength and pain control for left UE/shoulder by 04/17/2015   Baseline limited knowledge of pain control strategies or progression of exercises for left UE   Status Revised               Plan - 03/14/15 1038    Clinical Impression Statement Patient is improving ability to perform exercises with decreased grinding and pain is left shoulder. Her AROM improved by 20 degrees following scapular stabilization exercises. She requires less verbal cuing demonstrating carryover to home program. She has limitations of pain and decreased strength and will continue to benefit from weekly therapy in order to further strengthen  ER and forward flexion in order to improve functional use with less difficulty.    Pt will benefit from skilled therapeutic intervention in order to improve on the following deficits Decreased strength;Decreased activity tolerance;Decreased endurance   Rehab Potential Fair   PT Frequency 1x / week   PT Duration 8  weeks   PT Treatment/Interventions Manual techniques;Cryotherapy;Therapeutic exercise;Patient/family education;Electrical Stimulation;Ultrasound   PT Next Visit Plan Motor control/movement patterning, increase musculature strength, pain control        Problem List There are no active problems to display for this patient.   Beacher May PT 03/14/2015, 4:12 PM  [place] [facility] [address] [place], [address] Phone: [phone]   Fax:  [phone]  Name: Suzanne Snyder MRN: [id number] Date of Birth: 09-15-1950

## 2015-03-21 ENCOUNTER — Ambulatory Visit: Admitting: Physical Therapy

## 2015-03-21 ENCOUNTER — Encounter: Payer: Self-pay | Admitting: Physical Therapy

## 2015-03-21 DIAGNOSIS — M25512 Pain in left shoulder: Principal | ICD-10-CM

## 2015-03-21 DIAGNOSIS — G8929 Other chronic pain: Secondary | ICD-10-CM

## 2015-03-21 DIAGNOSIS — M6281 Muscle weakness (generalized): Secondary | ICD-10-CM

## 2015-03-21 NOTE — Therapy (Signed)
[facility] [facility] [address] [place], [place], [address] Phone: [phone]   Fax:  [phone]  Physical Therapy Treatment  Patient Details  Name: Suzanne Snyder MRN: [id number] Date of Birth: 1950-04-20 No Data Recorded  Encounter Date: 03/21/2015      PT End of Session - 03/21/15 1200    Visit Number 45   Number of Visits 50   Date for PT Re-Evaluation 04/17/15   PT Start Time 1105   PT Stop Time 1145   PT Time Calculation (min) 40 min   Activity Tolerance Patient tolerated treatment well   Behavior [facility]      Past Medical History  Diagnosis Date  . Anxiety   . Diabetes mellitus without complication (HCC)   . Osteoporosis     Past Surgical History  Procedure Laterality Date  . 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 Spine surgery N/A 1984    L4-5, not sure of procedure  . Rotator cuff repair Left 2014 and 2015    There were no vitals filed for this visit.  Visit Diagnosis:  Chronic shoulder pain, left  Muscle weakness of left upper extremity      Subjective Assessment - 03/21/15 1107    Subjective Patient reports she is able to move left shoulder with less pain and difficulty this week.     Limitations House hold activities   Patient Stated Goals Patient would like to be able to lift, reach with left UE for improved function with daily activities with less pain    Currently in Pain? Yes   Pain Score 3    Pain Location Shoulder   Pain Orientation Left   Pain Descriptors / Indicators Aching   Pain Type Chronic pain   Pain Onset More than a month ago   Pain Frequency Intermittent      Objective: AROM left shoulder: sitting forward elevation: 135 with grinding/pain Palpation: + spasms along bilateral cervical spine paraspinal muscles, upper trapezius, middle trapezius Strength: left shoulder forward elevation 3+/5, ER 3/5         [facility]  Adult PT Treatment/Exercise - 03/21/15 1111    Exercises   Exercises Other Exercises   Other Exercises   patient performed exercises with guidance, verbal and tactile cues and demonstration of PT: supine lying: RS with right UE at 90 degrees flexion 3 x 10 reps with moderate resistance, ER/IR 2 x 10 reps,shoulder punch to ceiling without weight 2 x 10 reps,  side lying ER without weight x 25 reps, standing UE ranger: forward flexion 2 x 15, ER/IR 2 x 15 with tactile guidance, supine scapular retraction with tactile cues 2 x 10 reps,side lying shoulder elevation and depression (with mild manual resistance for depression) 3 x 10 reps,   Ultrasound   Ultrasound Goals --  inflammation   Manual Therapy   Manual Therapy Joint mobilization;Soft tissue mobilization   Manual therapy comments + spasms with decreased soft tissue elasticity left upper trapezius, cervical spine paraspinal muscles bilaterally left>right   Joint Mobilization scapulo thoracic mobilizaiton with patient in side lying right   Soft tissue mobilization STM performed by therapist to cervical spine paraspinal muscles left side > right, upper trapezius stretch x 3 reps x 20 second holds to right and left       Patient response to treatment: improved motor control, strength with all exercises with minimal cuing and increased intensity of  resistance given, mild pain reported with ER exercises and with raising left UE above shoulder level, better at end of session         PT Education - 03/21/15 1200    Education provided Yes   Education Details HEP: continue with strength and ROM exercises as tolerated   Person(s) Educated Patient   Methods Explanation;Demonstration;Verbal cues   Comprehension Verbalized understanding;Returned demonstration;Verbal cues required             PT Long Term Goals - 02/20/15 1600    PT LONG TERM GOAL #1   Title Patient will improve AROM to 120 degrees left shoulder with mild discomfort for  reaching into cabinet or performing hair care by 04/17/2015   Baseline AROM overhead to 100 with increased pain and decreased strength   Status Revised   PT LONG TERM GOAL #2   Title Patient will improve quickDash score to 25% or less by 04/17/2015 indicating improved functional use with decreased pain in left shoulder   Baseline initial 42% to 50% impairment (current score 36% self perceived disability)   Status Revised   PT LONG TERM GOAL #3   Title Patient will  be independent with home program for strength and pain control for left UE/shoulder by 04/17/2015   Baseline limited knowledge of pain control strategies or progression of exercises for left UE   Status Revised               Plan - 03/21/15 1200    Clinical Impression Statement Patient is improving ability to perform exercises with decreased grinding in left shoulder. She is able to perform exercises with increased intensity and demonstrates good carry over of strength to daily activities using left UE with less difficulty and pain. She continues with limitations of strength and endurance in left UE and will benefit from additional physical therapy intervention to address limitations.    Rehab Potential Fair   PT Frequency 1x / week   PT Duration 8 weeks   PT Treatment/Interventions Manual techniques;Cryotherapy;Therapeutic exercise;Patient/family education;Electrical Stimulation;Ultrasound   PT Next Visit Plan Motor control/movement patterning, increase musculature strength, pain control        Problem List There are no active problems to display for this patient.   Beacher May PT 03/21/2015, 6:36 PM  [place] [facility] PHYSICAL [facility] SPORTS MEDICINE [address], [place], [address] Phone: [phone]   Fax:  [phone]  Name: Suzanne Snyder MRN: [id number] Date of Birth: [birth year]

## 2015-03-26 ENCOUNTER — Encounter: Payer: Self-pay | Admitting: Physical Therapy

## 2015-03-26 ENCOUNTER — Ambulatory Visit: Admitting: Physical Therapy

## 2015-03-26 DIAGNOSIS — M25512 Pain in left shoulder: Secondary | ICD-10-CM | POA: Diagnosis not present

## 2015-03-26 DIAGNOSIS — M6281 Muscle weakness (generalized): Secondary | ICD-10-CM

## 2015-03-26 DIAGNOSIS — G8929 Other chronic pain: Secondary | ICD-10-CM

## 2015-03-26 NOTE — Therapy (Signed)
[place] [facility] [address] [place], [place], [address] Phone: [phone]   Fax:  [phone]  Physical Therapy Treatment  Patient Details  Name: Suzanne Snyder MRN: [id number] Date of Birth: [birth year] No Data Recorded  Encounter Date: 03/26/2015      PT End of Session - 03/26/15 1119    Visit Number 46   Number of Visits 50   Date for PT Re-Evaluation 04/17/15   PT Start Time 1113   PT Stop Time 1147   PT Time Calculation (min) 34 min   Activity Tolerance Patient tolerated treatment well   Behavior [facility] assessed/performed      Past Medical History  Diagnosis Date  . Anxiety   . Diabetes mellitus without complication (HCC)   . Osteoporosis     Past Surgical History  Procedure Laterality Date  . 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 Spine surgery N/A 1984    L4-5, not sure of procedure  . Rotator cuff repair Left 2014 and 2015    There were no vitals filed for this visit.  Visit Diagnosis:  Chronic shoulder pain, left  Muscle weakness of left upper extremity      Subjective Assessment - 03/26/15 1116    Subjective Patient reports she is able to move left shoulder with mild grinding in shoulder this week.    Limitations House hold activities   Patient Stated Goals Patient would like to be able to lift, reach with left UE for improved function with daily activities with less pain    Currently in Pain? Yes   Pain Score 3    Pain Location Shoulder   Pain Orientation Left   Pain Descriptors / Indicators Aching   Pain Type Chronic pain   Pain Onset More than a month ago      Objective; AROM: left shoulder forward elevation in sitting 140/145        [facility] Adult PT Treatment/Exercise - 03/26/15 1118    Exercises   Exercises Other Exercises   Other Exercises  patient performed exercises with guidance, verbal and tactile cues and demonstration of PT: supine  lying: RS with right UE at 90 degrees flexion 3 x 10 reps with moderate resistance, ER/IR 2 x 10 reps,shoulder punch to ceiling without weight 2 x 10 reps, side lying ER without weight x 25 reps, standing UE ranger with 3# weight on post: forward flexion 2 x 15, ER/IR 2 x 15 with tactile guidance, supine scapular retraction with tactile cues 2 x 10 reps,side lying shoulder elevation and depression (with mild manual resistance for depression) 3 x 10 reps,    Ultrasound   Ultrasound Goals --  inflammation   Manual Therapy   Manual Therapy Joint mobilization;Soft tissue mobilization   Manual therapy comments + spasms with decreased soft tissue elasticity left upper trapeius   Joint Mobilization scapulo thoracic mobilizaiton with patient in side lying right   Soft tissue mobilization STM performed by therapist to left upper trapezius and subscapularis muscles with patient side lying       Patient response to treatment: improved strength noted with increased resistance given and decreased crepitus in GHJ left shoulder, able to perform all exercises with minimal cuing and assistance and no significant increased pain noted         PT Education - 03/26/15 1119    Education provided Yes   Education Details HEP; continue with ROM/strengthening  for left UE  as tolerated   Person(s) Educated Patient   Methods Explanation;Demonstration;Verbal cues   Comprehension Verbalized understanding;Returned demonstration;Verbal cues required             PT Long Term Goals - 02/20/15 1600    PT LONG TERM GOAL #1   Title Patient will improve AROM to 120 degrees left shoulder with mild discomfort for reaching into cabinet or performing hair care by 04/17/2015   Baseline AROM overhead to 100 with increased pain and decreased strength   Status Revised   PT LONG TERM GOAL #2   Title Patient will improve quickDash score to 25% or less by 04/17/2015 indicating improved functional use with decreased pain in left  shoulder   Baseline initial 42% to 50% impairment (current score 36% self perceived disability)   Status Revised   PT LONG TERM GOAL #3   Title Patient will  be independent with home program for strength and pain control for left UE/shoulder by 04/17/2015   Baseline limited knowledge of pain control strategies or progression of exercises for left UE   Status Revised               Plan - 03/26/15 1200    Clinical Impression Statement Patient demonstrates good carry over of strength and endurance left shoulder as indicated by improved functional use left UE/shoulder with daily tasks. She continues with decreased endurance and increased pain in her shoulder and will benefit from additional therapy inervention to achieve goals.    Pt will benefit from skilled therapeutic intervention in order to improve on the following deficits Decreased strength;Decreased activity tolerance;Decreased endurance   Rehab Potential Fair   PT Frequency 1x / week   PT Duration 8 weeks   PT Treatment/Interventions Manual techniques;Cryotherapy;Therapeutic exercise;Patient/family education;Electrical Stimulation;Ultrasound   PT Next Visit Plan Motor control/movement patterning, increase musculature strength, pain control        Problem List There are no active problems to display for this patient.   Beacher May PT 03/26/2015, 9:49 PM  [place] [facility] PHYS[facility] AND SPORTS MEDICINE [address], [place], [address] Phone: [phone]   Fax:  [phone]  Name: Suzanne Snyder MRN: [id number] Date of Birth: [birth year]

## 2015-04-04 ENCOUNTER — Encounter: Payer: Self-pay | Admitting: Physical Therapy

## 2015-04-04 ENCOUNTER — Ambulatory Visit: Admitting: Physical Therapy

## 2015-04-04 DIAGNOSIS — M6281 Muscle weakness (generalized): Secondary | ICD-10-CM

## 2015-04-04 NOTE — Therapy (Signed)
[facility] [address] [place], [place], [address] Phone: [phone]   Fax:  [phone]  Patient Details  Name: Suzanne Snyder MRN: [id number] Date of Birth: 12-26-1950 Referring Provider:  Carin PrimroseKamath, Ganesh, MD  Encounter Date: 04/04/2015   Patient arrived today and discussed medical concerns that have occurred in the past few days. She was visibly upset and requested no therapy treatment  today.  No therapy treatment rendered. Patient to undergo CT scan of lungs tomorrow and will contact therapist regarding future appointments.    Beacher MayBrooks, Marie PT 04/04/2015, 12:03 PM  [facility] [address] [place], [place], [address] Phone: [phone]   Fax:  [phone]

## 2015-04-11 ENCOUNTER — Encounter: Payer: Self-pay | Admitting: Physical Therapy

## 2015-04-11 ENCOUNTER — Ambulatory Visit: Admitting: Physical Therapy

## 2015-04-11 DIAGNOSIS — M6281 Muscle weakness (generalized): Secondary | ICD-10-CM

## 2015-04-11 DIAGNOSIS — G8929 Other chronic pain: Secondary | ICD-10-CM

## 2015-04-11 DIAGNOSIS — M25512 Pain in left shoulder: Principal | ICD-10-CM

## 2015-04-11 NOTE — Therapy (Signed)
[place] PH[facility] AND SPORTS MEDICINE [address], [place], [address] Phone: [phone]   Fax:  [phone]  Physical Therapy Treatment/Discharge Summary  Patient Details  Name: CERYS WINGET MRN: [id number] Date of Birth: 01/11/1951 No Data Recorded  Encounter Date: 04/11/2015   Patient began physical therapy 06/12/14 and has attended 47 sessions through 04/11/15 with goals partially met or achieved. She is independent with home program with good understanding of self management to continue to improve strength and endurance. Plan: discharge from physical therapy      PT End of Session - 04/11/15 1143    Visit Number 47   Number of Visits 50   Date for PT Re-Evaluation 04/17/15   PT Start Time 1133   PT Stop Time 1205   PT Time Calculation (min) 32 min   Activity Tolerance Patient tolerated treatment well   Behavior During Therapy [facility] for tasks assessed/performed      Past Medical History  Diagnosis Date  . Anxiety   . Diabetes mellitus without complication ([place])   . Osteoporosis     Past Surgical History  Procedure Laterality Date  . 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 Spine surgery N/A 1984    L4-5, not sure of procedure  . Rotator cuff repair Left 2014 and 2015    There were no vitals filed for this visit.  Visit Diagnosis:  Chronic shoulder pain, left  Muscle weakness of left upper extremity      Subjective Assessment - 04/11/15 1134    Subjective       Pertinent history Patient reports she is getting ready for surgery (personal) and had CT scan of lungs with (+) result for ? masses. She reports she has noticed a great deal of improvement with left shoulder ROM and strength and agrees to be discharged from PT at this time to continue with HEP as instructed.   Patient reports history of pain in left shoulder prior to 2014 with subsequent surgery, physical therapy and re repair in 2015. She has  never fully recovered from the surgeries.   Limitations House hold activities   Patient Stated Goals Patient would like to be able to lift, reach with left UE for improved function with daily activities with less pain    Currently in Pain? Yes   Pain Score 3    Pain Location Shoulder   Pain Orientation Left   Pain Descriptors / Indicators Aching   Pain Type Chronic pain   Pain Onset More than a month ago   Pain Frequency Intermittent      Objective: AROM left shoulder: sitting forward elevation: 135 with mild to no palpable grinding/pain Palpation: + spasms along bilateral cervical spine paraspinal muscles, upper trapezius, middle trapezius left side Strength: left shoulder forward elevation ~3+/5, ER ~3+/5  Outcome measure: QuickDash 32%   Treatment: Exercise:   patient performed exercises with guidance, verbal and tactile cues and demonstration of PT: supine lying: RS with right UE at 90 degrees flexion 3 x 10 reps with moderate resistance, ER/IR 2 x 10 reps,shoulder punch to ceiling without weight 2 x 10 reps, side lying ER without weight x 25 reps, (not performed today: standing UE ranger with 3# weight on post: forward flexion 2 x 15, ER/IR 2 x 15 with tactile guidance), supine scapular retraction with tactile cues 2 x 10 reps,side lying shoulder elevation and depression (with mild manual resistance for depression) 3 x 10  reps, side lying isometric holding for abduction with short lever arm and multiple angles 15-30 seconds with right UE assist for lowering Manual Therapy:   STM performed by therapist to cervical spine paraspinals, left upper trapezius and subscapularis muscles with patient supine lying  Patient response to treatment:  Patient demonstrated improved control of left shoulder with decreased grinding when pulling scapula back and down, She verbalized good understanding of home program with written instructions given for home        PT Education - 04/11/15 1142     Education provided Yes   Education Details HEP: shoulder precautions for lifting heavy objects and observe pain and grinding to avoid injury, continue with resistive exercises as outlined in handouts   Person(s) Educated Patient   Methods Explanation, handout   Comprehension Verbalized understanding             PT Long Term Goals - 04/11/15 1145    PT LONG TERM GOAL #1   Title Patient will improve AROM to 120 degrees left shoulder with mild discomfort for reaching into cabinet or performing hair care by 04/17/2015   Baseline AROM overhead to 100 with increased pain and decreased strength:( 04/11/15: able to reach up to 120, lowering is more difficult)   Status Partially Met   PT LONG TERM GOAL #2   Title Patient will improve quickDash score to 25% or less by 04/17/2015 indicating improved functional use with decreased pain in left shoulder   Baseline initial 42% to 50% impairment (current score 36% self perceived disability) (current 04/11/2015: 32% )   Status Partially Met   PT LONG TERM GOAL #3   Title Patient will  be independent with home program for strength and pain control for left UE/shoulder by 04/17/2015   Baseline limited knowledge of pain control strategies or progression of exercises for left UE   Status Achieved               Plan - 04/11/15 1144    Clinical Impression Statement Patient demonstrates good carry over with strength and ROM left shoulder as demonstrated by improved functional use of left UE with daily tasks. She is independent with home program and ready for discharge from physical therapy with all goals partially met (#1,2) or completely achieved (#3)   Pt will benefit from skilled therapeutic intervention in order to improve on the following deficits Decreased strength;Decreased activity tolerance;Decreased endurance   Rehab Potential Fair   Clinical Impairments Affecting Rehab Potential multiple surgeries left shoulder for rotator cuff tears without  complete return to full function following either surgery   PT Frequency 1x / week   PT Duration 8 weeks   PT Treatment/Interventions Manual techniques;Cryotherapy;Therapeutic exercise;Patient/family education;Electrical Stimulation;Ultrasound   PT Next Visit Plan Discharge from physical therapy        Problem List There are no active problems to display for this patient.   Jomarie Longs PT 04/11/2015, 10:59 PM  [place] PH[facility]ICAL AND SPORTS MEDICINE [address], [place], [address] Phone: [phone]   Fax:  [phone]  Name: SHAMERA YARBERRY MRN: [id number] Date of Birth: Apr 01, 1950

## 2017-03-05 ENCOUNTER — Emergency Department: Payer: Medicare Other

## 2017-03-05 ENCOUNTER — Encounter: Payer: Self-pay | Admitting: Emergency Medicine

## 2017-03-05 ENCOUNTER — Emergency Department
Admission: EM | Admit: 2017-03-05 | Discharge: 2017-03-05 | Disposition: A | Discharge status: Home / Self Care | Payer: Medicare Other | Attending: Emergency Medicine | Admitting: Emergency Medicine

## 2017-03-05 DIAGNOSIS — R05 Cough: Secondary | ICD-10-CM | POA: Diagnosis not present

## 2017-03-05 DIAGNOSIS — E119 Type 2 diabetes mellitus without complications: Secondary | ICD-10-CM | POA: Diagnosis not present

## 2017-03-05 DIAGNOSIS — G501 Atypical facial pain: Secondary | ICD-10-CM | POA: Diagnosis not present

## 2017-03-05 DIAGNOSIS — Z79899 Other long term (current) drug therapy: Secondary | ICD-10-CM | POA: Insufficient documentation

## 2017-03-05 DIAGNOSIS — F17210 Nicotine dependence, cigarettes, uncomplicated: Secondary | ICD-10-CM | POA: Diagnosis not present

## 2017-03-05 DIAGNOSIS — H9203 Otalgia, bilateral: Secondary | ICD-10-CM | POA: Diagnosis present

## 2017-03-05 DIAGNOSIS — J101 Influenza due to other identified influenza virus with other respiratory manifestations: Secondary | ICD-10-CM | POA: Diagnosis not present

## 2017-03-05 DIAGNOSIS — Z96642 Presence of left artificial hip joint: Secondary | ICD-10-CM | POA: Diagnosis not present

## 2017-03-05 DIAGNOSIS — R509 Fever, unspecified: Secondary | ICD-10-CM | POA: Diagnosis not present

## 2017-03-05 LAB — INFLUENZA PANEL BY PCR (TYPE A & B)
Influenza A By PCR: POSITIVE — AB
Influenza B By PCR: NEGATIVE

## 2017-03-05 IMAGING — CR DG CHEST 2V
2 series · 2 of 2 positions shown · non-contrast
Comparison: [DATE]

CLINICAL DATA: Cough and sinus congestion.  Smoking history.

EXAM:
CHEST  2 VIEW

[chest pa]
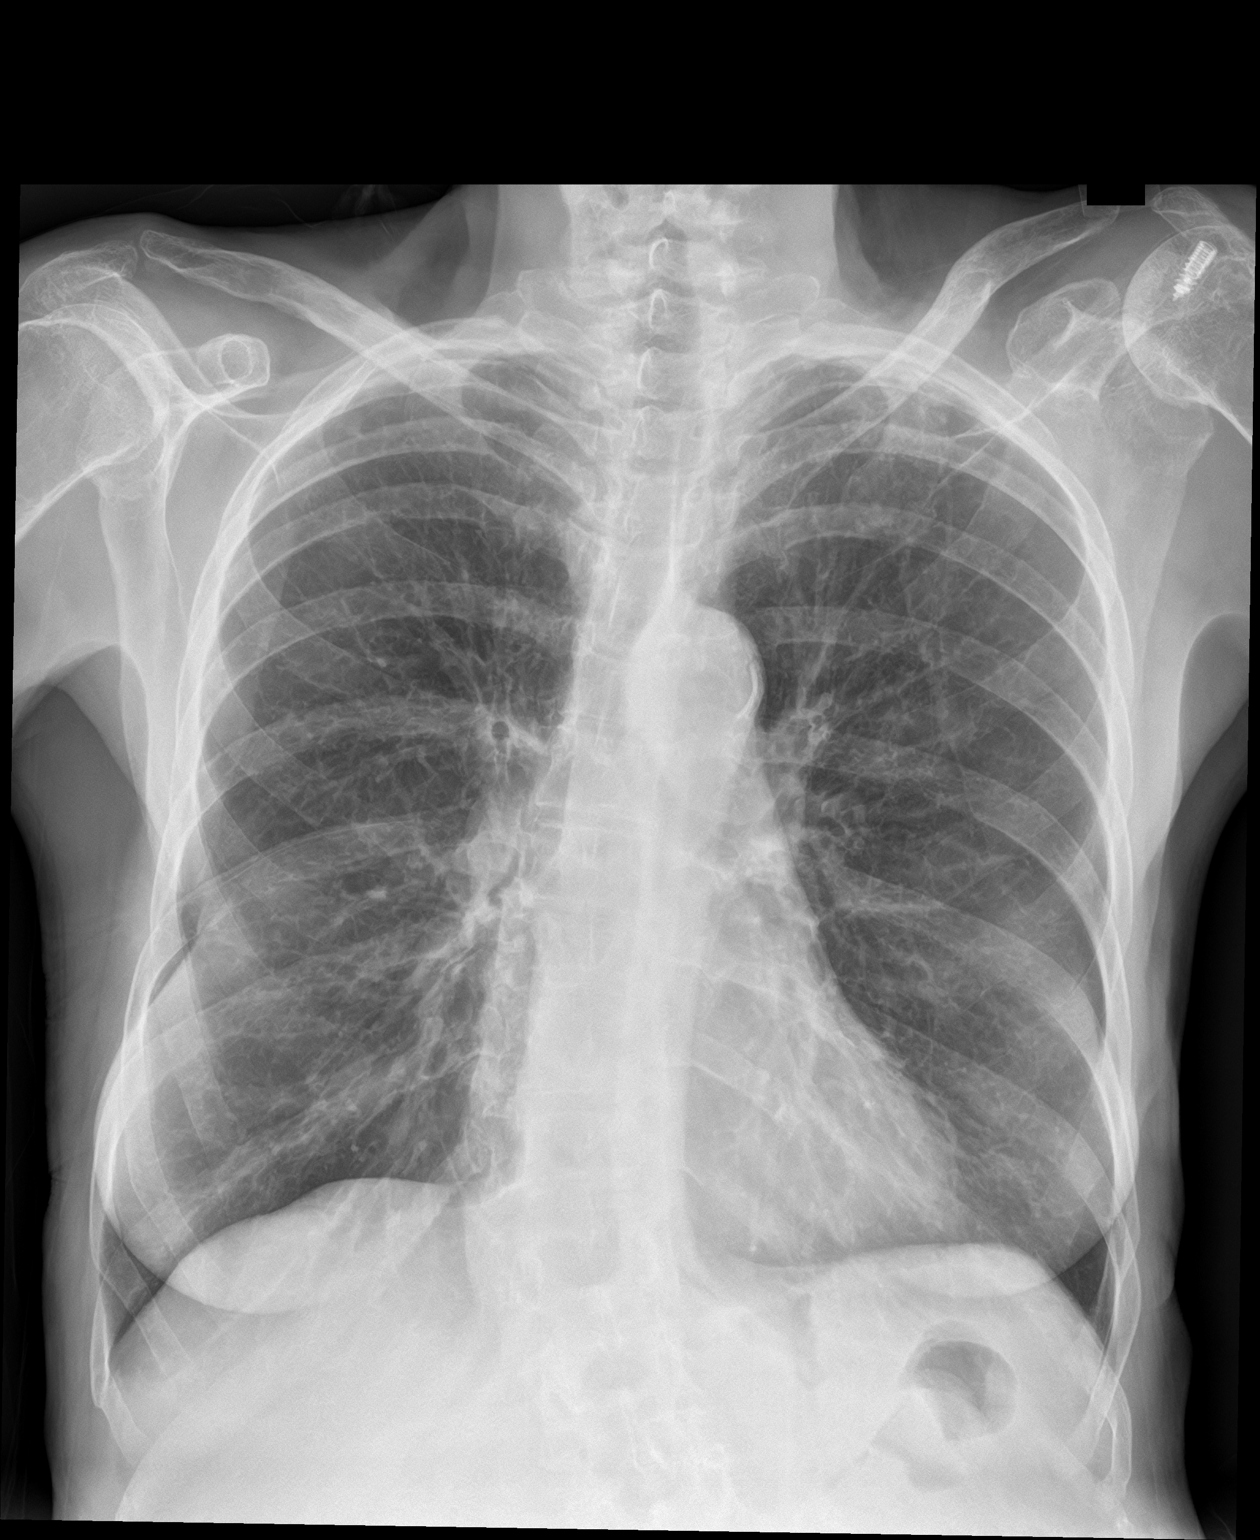

[chest lat]
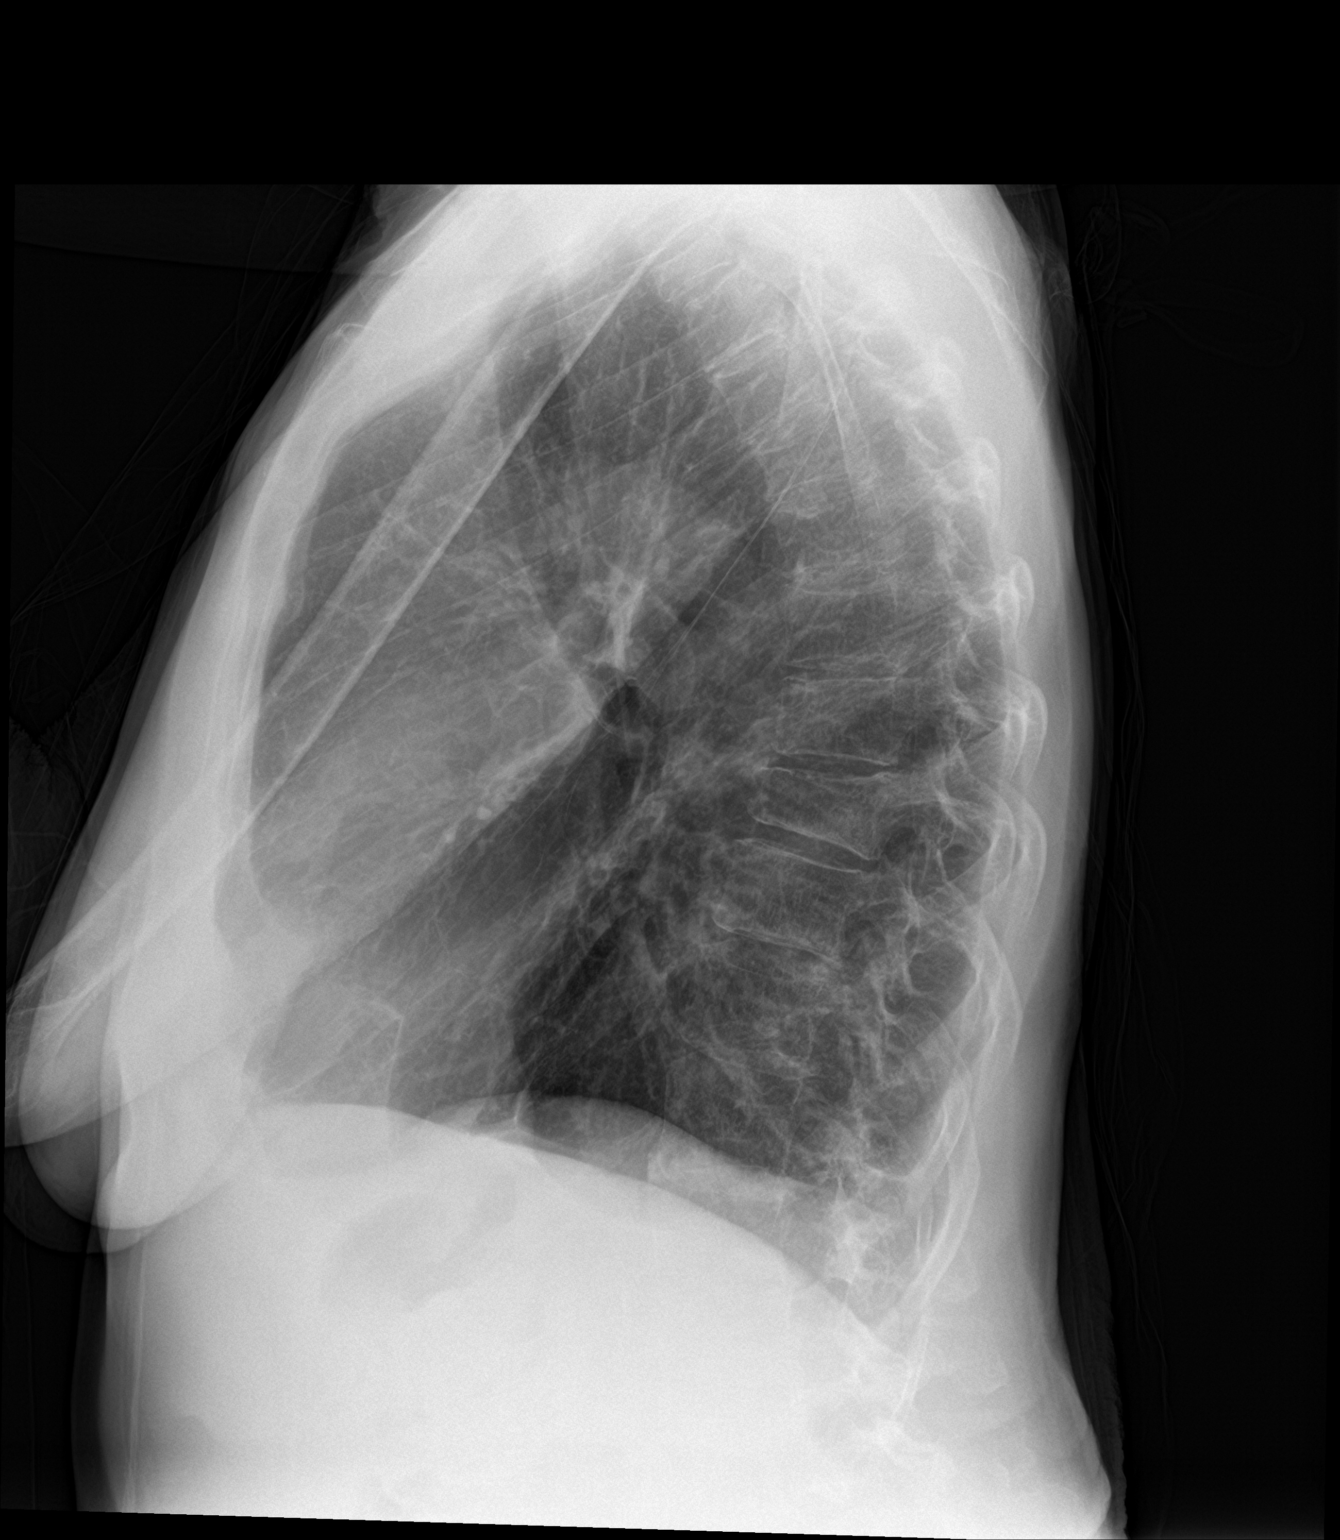

[2 of 2 positions shown; findings below may reference images not displayed]

FINDINGS: Heart size at the upper limits of normal. Thoracic aortic
atherosclerosis. Lungs show chronic markings but no sign of active
infiltrate, mass, effusion or collapse. There is an old compression
fracture in the midthoracic region.
IMPRESSION: No active disease. Borderline cardiomegaly. Aortic atherosclerosis.
Some chronic interstitial lung markings.

## 2017-03-05 MED ORDER — SODIUM CHLORIDE 0.9 % IV BOLUS (SEPSIS)
1000.0000 mL | Freq: Once | INTRAVENOUS | Status: AC
  Administered 2017-03-05: 1000 mL via INTRAVENOUS

## 2017-03-05 MED ORDER — GUAIFENESIN-CODEINE 100-10 MG/5ML PO SOLN: 5.0000 mL | ORAL | 0 refills | Status: DC | PRN

## 2017-03-05 MED ORDER — ACETAMINOPHEN 500 MG PO TABS
ORAL_TABLET | ORAL | Status: AC
  Administered 2017-03-05: 1000 mg via ORAL
  Filled 2017-03-05: qty 2

## 2017-03-05 MED ORDER — ACETAMINOPHEN 500 MG PO TABS
1000.0000 mg | ORAL_TABLET | Freq: Once | ORAL | Status: AC
  Administered 2017-03-05: 1000 mg via ORAL

## 2017-03-05 MED ORDER — OSELTAMIVIR PHOSPHATE 75 MG PO CAPS: 75.0000 mg | ORAL_CAPSULE | Freq: Two times a day (BID) | ORAL | 0 refills | Status: AC

## 2017-03-05 NOTE — ED Provider Notes (Signed)
[facility] Emergency Department Provider Note  ____________________________________________   First MD Initiated Contact with Patient 03/05/17 1242     (approximate)  I have reviewed the triage vital signs and the nursing notes.   HISTORY  Chief Complaint Otalgia  HPI Suzanne Snyder is a 67 y.o. female is here with complaint of bilateral ear pain, cough, facial pressure and fever.  Patient states that she began getting sick approximately 3 days ago.  She states her temperature is been as high as 103 at home.  Patient has been taking over-the-counter Mucinex without any relief.  Patient states that she got the flu vaccine this year however she was around her daughter who was diagnosed with the flu this week.  She denies any vomiting or diarrhea.  She rates her pain as a 9/10.   Past Medical History:  Diagnosis Date  . Anxiety   . Diabetes mellitus without complication ([facility])   . Osteoporosis     There are no active problems to display for this patient.   Past Surgical History:  Procedure Laterality Date  . 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 ROTATOR CUFF REPAIR Left 2014 and 2015  . SPINE SURGERY N/A 1984   L4-5, not sure of procedure    Prior to Admission medications   Medication Sig Start Date End Date Taking? Authorizing Provider  clonazePAM (KLONOPIN) 0.5 MG tablet Take 0.5 mg by mouth 2 (two) times daily as needed for anxiety.    [provider]  guaiFENesin-codeine 100-10 MG/5ML syrup Take 5 mLs by mouth every 4 (four) hours as needed. 03/05/17   Tommi Rumps, PA-C  oseltamivir (TAMIFLU) 75 MG capsule Take 1 capsule (75 mg total) by mouth 2 (two) times daily for 5 days. 03/05/17 03/10/17  Tommi Rumps, PA-C  tapentadol (NUCYNTA) 50 MG TABS tablet Take 50 mg by mouth.    [provider]  Vitamin D, Ergocalciferol, (DRISDOL) 50000 UNITS CAPS capsule Take 50,000 Units by mouth every 7 (seven) days.    [provider]    Allergies Erythromycin  No family history on file.  Social History Social History   Tobacco Use  . Smoking status: Current Every Day Smoker    Packs/day: 1.00    Years: 45.00    Pack years: 45.00    Types: Cigarettes  . Smokeless tobacco: Never Used  Substance Use Topics  . Alcohol use: No  . Drug use: No    Review of Systems Constitutional: Positive fever/chills Eyes: No visual changes. ENT: No sore throat.  Positive bilateral ear pain. Cardiovascular: Denies chest pain. Respiratory: Denies shortness of breath.  Positive coughing. Gastrointestinal: No abdominal pain.  No nausea, no vomiting.  No diarrhea.   Musculoskeletal: Negative for back pain. Skin: Negative for rash. Neurological: Negative for headaches, focal weakness or numbness. ____________________________________________   PHYSICAL EXAM:  VITAL SIGNS: ED Triage Vitals [03/05/17 1229]  Enc Vitals Group     BP (!) 130/57     Pulse Rate 84     Resp 18     Temp (!) 101.2 F (38.4 C)     Temp Source Oral     SpO2 95 %     Weight 112 lb (50.8 kg)     Height 5\' 8"  (1.727 m)     Head Circumference      Peak Flow      Pain Score 9     Pain Loc  Pain Edu?      Excl. in GC?    Constitutional: Alert and oriented. Well appearing and in no acute distress. Eyes: Conjunctivae are normal.  Head: Atraumatic. Nose: No congestion/rhinnorhea.  TMs are dull bilateral with poor light reflex.  Right TM with minimal injection. Mouth/Throat: Mucous membranes are dry in appearance.  Oropharynx non-erythematous.  Neck: No stridor.   Hematological/Lymphatic/Immunilogical: No cervical lymphadenopathy. Cardiovascular: Normal rate, regular rhythm. Grossly normal heart sounds.  Good peripheral circulation. Respiratory: Normal respiratory effort.  No retractions. Lungs there is an audible wheeze noted best at the left base.  Patient has a very congested cough.  She is able to speak in complete  sentences. Gastrointestinal: Soft and nontender. No distention.  Musculoskeletal: Moves upper and lower extremities.  Patient is ambulatory. Neurologic:  Normal speech and language. No gross focal neurologic deficits are appreciated. No gait instability. Skin:  Skin is warm, dry and intact. No rash noted. Psychiatric: Mood and affect are normal. Speech and behavior are normal.  ____________________________________________   LABS (all labs ordered are listed, but only abnormal results are displayed)  Labs Reviewed  INFLUENZA PANEL BY PCR (TYPE A & B) - Abnormal; Notable for the following components:      Result Value   Influenza A By PCR POSITIVE (*)    All other components within normal limits    PROCEDURES  Procedure(s) performed: None  Procedures  Critical Care performed: No  ____________________________________________   INITIAL IMPRESSION / ASSESSMENT AND PLAN / ED COURSE Patient was made aware that flu test was positive for influenza A.  Very possibly is her third day of symptoms.  We discussed benefit of starting Tamiflu versus not.  Patient was given a prescription for Tamiflu 75 mg twice daily for 5 days along with a prescription for guaifenesin with codeine every 4 hours as needed for cough and congestion.  Patient will continue using her inhalers at home for her asthma as needed.  Increase fluids.  And take Tylenol or ibuprofen as needed for fever.  She is instructed to return to the emergency department if any severe worsening of her symptoms over the weekend.  Chest x-ray done today was reassuring that she does not have pneumonia.  ____________________________________________   FINAL CLINICAL IMPRESSION(S) / ED DIAGNOSES  Final diagnoses:  Influenza A     ED Discharge Orders        Ordered    oseltamivir (TAMIFLU) 75 MG capsule  2 times daily     03/05/17 1600    guaiFENesin-codeine 100-10 MG/5ML syrup  Every 4 hours PRN     03/05/17 1600       Note:   This document was prepared using Dragon voice recognition software and may include unintentional dictation errors.    Tommi RumpsSummers, Luciana Cammarata L, PA-C 03/05/17 1609    Sharyn CreamerQuale, Mark, MD 03/05/17 [phone]

## 2017-03-05 NOTE — Discharge Instructions (Signed)
Up with your primary care doctor if any continued problems.  Begin taking Tamiflu 1 twice a day for the next 5 days.  Robitussin-AC as needed for cough and congestion.  Be aware that this medication could cause drowsiness because it has a narcotic in it to help with your coughing.  Continue using your inhalers at home.  Return to the emergency room over the weekend if any severe worsening of your symptoms.  Your chest x-ray is negative for pneumonia.

## 2017-03-05 NOTE — ED Notes (Signed)
Pt c/o generalized body aches, fever, and bilateral ear pain. Pt states sinus pressure with green mucous. Pt states symptoms x 3 days. Pt states taking OTC mucinex at home with no relief.

## 2017-03-05 NOTE — ED Triage Notes (Signed)
First Nurse Note:  Arrives with c/o sinus congestion, cough, and bilateral ear pain x 3 days.    AAOx3.  Skin warm and dry. NAD

## 2017-03-05 NOTE — ED Notes (Signed)
Patient transported to X-ray 

## 2017-03-05 NOTE — ED Triage Notes (Signed)
Pt comes into the ED via POV c/o bilateral ear pain, cough, and facial pressure.  Patient denies any fevers at home and has been using OTC mucinex with no relief.  Patient febrile here in triage, but in NAD at this time with even and unlabored respirations.

## 2017-07-13 ENCOUNTER — Ambulatory Visit: Payer: Medicare Other

## 2017-07-19 ENCOUNTER — Ambulatory Visit: Payer: Medicare Other | Attending: Orthopedic Surgery

## 2017-07-19 DIAGNOSIS — M25612 Stiffness of left shoulder, not elsewhere classified: Secondary | ICD-10-CM | POA: Diagnosis present

## 2017-07-19 DIAGNOSIS — M6281 Muscle weakness (generalized): Secondary | ICD-10-CM | POA: Insufficient documentation

## 2017-07-19 DIAGNOSIS — M25512 Pain in left shoulder: Secondary | ICD-10-CM | POA: Diagnosis not present

## 2017-07-19 NOTE — Therapy (Signed)
[place] [facility] [address], [place], [address] Phone: [phone]   Fax:  [phone]  Physical Therapy Evaluation  Patient Details  Name: Suzanne Snyder MRN: [id number] Date of Birth: November 13, 1950 Referring Provider: Olga Millers MD   Encounter Date: 07/19/2017  PT End of Session - 07/19/17 1022    Visit Number  1    Number of Visits  13    Date for PT Re-Evaluation  09/13/17    Authorization Type  1 /10 Medicare    PT Start Time  0900    PT Stop Time  0945    PT Time Calculation (min)  45 min    Activity Tolerance  Patient tolerated treatment well    Behavior During Therapy  [facility] for tasks assessed/performed       Past Medical History:  Diagnosis Date  . Anxiety   . Diabetes mellitus without complication ([facility])   . Osteoporosis     Past Surgical History:  Procedure Laterality Date  . 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 ROTATOR CUFF REPAIR Left 2014 and 2015  . SPINE SURGERY N/A 1984   L4-5, not sure of procedure    There were no vitals filed for this visit.   Subjective Assessment - 07/19/17 0959    Subjective  Patient is s/p L reverse total shoulder on 05/14/2017. Patient reports a fall a week ago but demonstrates only minor increase in pain which has began to feel better as days expired. Patient reports increased pain with overhead motions with inability to lift her arm without performing thoracic extension. Patient also reports she has been avoiding lifting heavy items. Patient reports increased pain with all arm movements but hurts the most with shoulder flexion. Patient demosntrates decreased ability to perform ADLs such as reaching in a high cabinet and preparing food. Patient reports her arm 'feels weird' and does not work as well as it did previously. Patient reports although despite her pain, she continues to use her shoulder for functional tasks.      Pertinent History  Chronic history of L  shoulder pain     Limitations  Lifting    Patient Stated Goals  To improve shoulder function and decrease pain    Currently in Pain?  Yes    Pain Score  4  worst: 8/10     Pain Location  Shoulder    Pain Orientation  Left    Pain Descriptors / Indicators  Aching    Pain Type  Acute pain;Surgical pain;Chronic pain    Pain Onset  More than a month ago    Pain Frequency  Intermittent         [facility] PT Assessment - 07/19/17 1253      Assessment   Medical Diagnosis  L reverse shoulder arthoplasty    Referring Provider  Kamath MD    Onset Date/Surgical Date  05/14/17    Hand Dominance  Right    Next MD Visit  unknown    Prior Therapy  yes - left shoulder      Precautions   Precautions  None      Balance Screen   Has the patient fallen in the past 6 months  Yes    How many times?  3    Has the patient had a decrease in activity level because of a fear of falling?   Yes    Is the patient reluctant to leave their  home because of a fear of falling?   No      Home Public house manager residence      Prior Function   Level of Independence  Independent    Vocation  Retired    Gaffer  N/A    Leisure  horseback riding(no longer performing)      Cognition   Overall Cognitive Status  Within Functional Limits for tasks assessed      Observation/Other Assessments   Observations  Guarded L shoulder       Sensation   Light Touch  Appears Intact      Posture/Postural Control   Posture/Postural Control  Postural limitations    Postural Limitations  Rounded Shoulders;Forward head      ROM / Strength   AROM / PROM / Strength  AROM;PROM;Strength      AROM   AROM Assessment Site  Shoulder    Right/Left Shoulder  Right;Left    Right Shoulder Extension  60 Degrees    Right Shoulder Flexion  170 Degrees    Right Shoulder ABduction  170 Degrees    Right Shoulder Internal Rotation  80 Degrees    Right Shoulder External Rotation  80 Degrees    Left  Shoulder Extension  30 Degrees    Left Shoulder Flexion  95 Degrees Increased pain    Left Shoulder ABduction  80 Degrees    Left Shoulder Internal Rotation  40 Degrees    Left Shoulder External Rotation  20 Degrees      PROM   PROM Assessment Site  Shoulder    Right/Left Shoulder  Right;Left Right UE: PROM is same as AROM    Left Shoulder Flexion  115 Degrees    Left Shoulder ABduction  90 Degrees    Left Shoulder Internal Rotation  50 Degrees    Left Shoulder External Rotation  35 Degrees      Strength   Strength Assessment Site  Shoulder    Right/Left Shoulder  Right;Left    Right Shoulder Flexion  4+/5    Right Shoulder Extension  5/5    Right Shoulder ABduction  4/5    Right Shoulder Internal Rotation  4/5    Right Shoulder External Rotation  4+/5    Left Shoulder Flexion  3+/5    Left Shoulder Extension  4-/5    Left Shoulder ABduction  3/5    Left Shoulder Internal Rotation  3+/5    Left Shoulder External Rotation  3+/5      Palpation   Palpation comment  Increased pain with palpation to the UT and lateral aspect of the arm      Objective measurements completed on examination: See above findings.   TREATMENT Therapeutic Exercise:  AAROM shoulder flexion in supine -- x20 Scapular retraction in supine -- x 20 Supine shoulder ER -- x 20   Patient demonstrates increased fatigue at the end of the session   PT Education - 07/19/17 1056    Education Details  HEP: Scapular retraction in supine, Shoulder ER in supine, Shoulder flexion AAROM in supine    Person(s) Educated  Patient    Methods  Explanation;Demonstration;Handout    Comprehension  Verbalized understanding;Returned demonstration          PT Long Term Goals - 07/19/17 1112      PT LONG TERM GOAL #1   Title  Patient will improve AROM to 120 degrees left shoulder with mild discomfort for reaching into  cabinet or performing hair care     Baseline  95 AROM in sitting     Time  8    Period  Weeks     Status  New      PT LONG TERM GOAL #2   Title  Patient will improve all shoulder strength 4+/5 for flexion and rotation to improve ability to manipulate objects in space.     Baseline  3/5 - 3+/5 within all measured motions    Time  8    Period  Weeks    Status  New    Target Date  09/13/17      PT LONG TERM GOAL #3   Title  Patient will  be independent with home program for strength and pain control for left UE/shoulder by 04/17/2015    Baseline  limited knowledge of pain control strategies or progression of exercises for left UE    Time  8    Period  Weeks    Status  New    Target Date  09/13/17             Plan - 07/19/17 1026    Clinical Impression Statement  Patient is a 67 yo right hand dominant female presenting with increased L shoulder pain s/p L reverse shoulder arthoplasty. Patient demonstrates increased shoulder dysfunction indicated by poor mobility/strength and increased pain with all shoulder movements. Patient educated on  implications and expectations of therapy. Patient demonstrates poor motor control, muscular endurance, and muscular strength. Patient will benefit from further skilled therapy to return to prior level of function.     Clinical Presentation  Stable    Clinical Presentation due to:  Pain is slowly improving     Rehab Potential  Fair    Clinical Impairments Affecting Rehab Potential  (+) highly motivated, (-) age, decreased strength    PT Frequency  2x / week    PT Duration  8 weeks    PT Treatment/Interventions  Passive range of motion;Manual techniques;Electrical Stimulation;Cryotherapy;Moist Heat;Ultrasound;Therapeutic exercise;Therapeutic activities;Neuromuscular re-education;Patient/family education;Scar mobilization;Dry needling;Iontophoresis 4mg /ml Dexamethasone    PT Next Visit Plan  progress AROM exercises    PT Home Exercise Plan  See education section    Consulted and Agree with Plan of Care  Patient       Patient will benefit from  skilled therapeutic intervention in order to improve the following deficits and impairments:  Pain, Impaired sensation, Decreased coordination, Decreased mobility, Increased muscle spasms, Decreased activity tolerance, Decreased endurance, Decreased range of motion, Decreased strength  Visit Diagnosis: Acute pain of left shoulder  Stiffness of left shoulder, not elsewhere classified  Muscle weakness (generalized)     Problem List There are no active problems to display for this patient.   Myrene GalasWesley Margaretta Chittum, PT DPT 07/19/2017, 1:14 PM  [place] [facility] [address] [place], [place], [address] Phone: [phone]   Fax:  [phone]  Name: Suzanne Snyder MRN: [id number] Date of Birth: 1950/08/27

## 2017-07-21 ENCOUNTER — Ambulatory Visit: Payer: Medicare Other

## 2017-07-21 DIAGNOSIS — M25512 Pain in left shoulder: Secondary | ICD-10-CM | POA: Diagnosis not present

## 2017-07-21 DIAGNOSIS — M25612 Stiffness of left shoulder, not elsewhere classified: Secondary | ICD-10-CM

## 2017-07-21 DIAGNOSIS — M6281 Muscle weakness (generalized): Secondary | ICD-10-CM

## 2017-07-21 NOTE — Therapy (Addendum)
[place] [facility] [address], [place], [address] Phone: [phone]   Fax:  [phone]  Physical Therapy Treatment  Patient Details  Name: Suzanne Snyder MRN: [id number] Date of Birth: 1950/02/22 Referring Provider: Olga Millers MD   Encounter Date: 07/21/2017  PT End of Session - 07/21/17 1613    Visit Number  2    Number of Visits  13    Date for PT Re-Evaluation  09/13/17    Authorization Type  2 /10 Medicare    PT Start Time  1515    PT Stop Time  1600    PT Time Calculation (min)  45 min    Activity Tolerance  Patient tolerated treatment well    Behavior During Therapy  [facility] for tasks assessed/performed       Past Medical History:  Diagnosis Date  . Anxiety   . Diabetes mellitus without complication (HCC)   . Osteoporosis     Past Surgical History:  Procedure Laterality Date  . 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 ROTATOR CUFF REPAIR Left 2014 and 2015  . SPINE SURGERY N/A 1984   L4-5, not sure of procedure    There were no vitals filed for this visit.  Subjective Assessment - 07/21/17 1606    Subjective  Patient reports she has been performing her exercises with minor difficulty but feels, "it's making her stronger".      Pertinent History  Chronic history of L shoulder pain     Limitations  Lifting    Patient Stated Goals  To improve shoulder function and decrease pain    Currently in Pain?  Yes    Pain Score  8     Pain Location  Shoulder    Pain Orientation  Left    Pain Descriptors / Indicators  Aching    Pain Type  Acute pain    Pain Onset  More than a month ago    Pain Frequency  Intermittent       TREATMENT Manual Therapy: STM performed to patient's upper trap, deltoids. And shoulder ERs on the affected side to decrease increased spasms and pain with shoulder flexion with patient in supine  Therapeutic Exercise:  AAROM shoulder flexion in supine -- 2x20 AAROM shoulder  ER/IR in supine - 2 x 20 Limits of stability with shoulder at 90 deg - 3 x 30sec Standing shoulder ER with 5 sec holds - x 10  Standing shoulder flexion with 5 sec holds - x10 Supine shoulder ER/IR -- x 20    Patient demonstrates increased fatigue at the end of the session   PT Education - 07/21/17 1613    Education Details  HEP: shoulder Isometric     Person(s) Educated  Patient    Methods  Explanation;Demonstration    Comprehension  Verbalized understanding;Returned demonstration          PT Long Term Goals - 07/19/17 1112      PT LONG TERM GOAL #1   Title  Patient will improve AROM to 120 degrees left shoulder with mild discomfort for reaching into cabinet or performing hair care     Baseline  95 AROM in sitting     Time  8    Period  Weeks    Status  New      PT LONG TERM GOAL #2   Title  Patient will improve all shoulder strength 4+/5 for flexion and rotation to improve  ability to manipulate objects in space.     Baseline  3/5 - 3+/5 within all measured motions    Time  8    Period  Weeks    Status  New    Target Date  09/13/17      PT LONG TERM GOAL #3   Title  Patient will  be independent with home program for strength and pain control for left UE/shoulder by 04/17/2015    Baseline  limited knowledge of pain control strategies or progression of exercises for left UE    Time  8    Period  Weeks    Status  New    Target Date  09/13/17            Plan - 07/21/17 1613    Clinical Impression Statement  Patient demosntrates improvement with performance of Shoulder IR/ER and flexion in supine indicating functional improvement between visitation sessions. Patient demonstrates poor strength and AAROM with mobility but is overall improving in these sections. Patient demonstrates increased fatigue after minimal exercise indicating decreased strength/endurance. Patient will benefit from further skilled therapy to return to prior level of function.     Rehab Potential   Fair    Clinical Impairments Affecting Rehab Potential  (+) highly motivated, (-) age, decreased strength    PT Frequency  2x / week    PT Duration  8 weeks    PT Treatment/Interventions  Passive range of motion;Manual techniques;Electrical Stimulation;Cryotherapy;Moist Heat;Ultrasound;Therapeutic exercise;Therapeutic activities;Neuromuscular re-education;Patient/family education;Scar mobilization;Dry needling;Iontophoresis 4mg /ml Dexamethasone    PT Next Visit Plan  progress AROM exercises    PT Home Exercise Plan  See education section    Consulted and Agree with Plan of Care  Patient       Patient will benefit from skilled therapeutic intervention in order to improve the following deficits and impairments:  Pain, Impaired sensation, Decreased coordination, Decreased mobility, Increased muscle spasms, Decreased activity tolerance, Decreased endurance, Decreased range of motion, Decreased strength  Visit Diagnosis: Acute pain of left shoulder  Stiffness of left shoulder, not elsewhere classified  Muscle weakness (generalized)     Problem List There are no active problems to display for this patient.   Myrene GalasWesley Bertil Brickey, PT DPT 07/21/2017, 4:16 PM  [place] [facility] [address] [place], [place], [address] Phone: [phone]   Fax:  [phone]  Name: Suzanne Snyder MRN: [id number] Date of Birth: 15-Jan-1950

## 2017-07-26 ENCOUNTER — Ambulatory Visit: Payer: Medicare Other

## 2017-07-29 ENCOUNTER — Ambulatory Visit: Payer: Medicare Other

## 2017-07-29 DIAGNOSIS — M25512 Pain in left shoulder: Secondary | ICD-10-CM | POA: Diagnosis not present

## 2017-07-29 DIAGNOSIS — M6281 Muscle weakness (generalized): Secondary | ICD-10-CM

## 2017-07-29 DIAGNOSIS — M25612 Stiffness of left shoulder, not elsewhere classified: Secondary | ICD-10-CM

## 2017-07-29 NOTE — Therapy (Signed)
[facility] [address] [place], [address] Phone: [phone]   Fax:  [phone]  Physical Therapy Treatment  Patient Details  Name: Suzanne Snyder MRN: [id number] Date of Birth: Jan 24, 1950 Referring Provider: Olga Millers MD   Encounter Date: 07/29/2017  PT End of Session - 07/29/17 1410    Visit Number  3    Number of Visits  13    Date for PT Re-Evaluation  09/13/17    Authorization Type  3 /10 Medicare    PT Start Time  1120    PT Stop Time  1203    PT Time Calculation (min)  43 min    Activity Tolerance  Patient tolerated treatment well    Behavior During Therapy  [facility] for tasks assessed/performed       Past Medical History:  Diagnosis Date  . Anxiety   . Diabetes mellitus without complication (HCC)   . Osteoporosis     Past Surgical History:  Procedure Laterality Date  . 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 ROTATOR CUFF REPAIR Left 2014 and 2015  . SPINE SURGERY N/A 1984   L4-5, not sure of procedure    There were no vitals filed for this visit.  Subjective Assessment - 07/29/17 1404    Subjective  Patient reports her shoulder has been hurting her for the past 3 days whenever she lifts up the shoulder and has been waking her up when she sleeps.     Pertinent History  Chronic history of L shoulder pain     Limitations  Lifting    Patient Stated Goals  To improve shoulder function and decrease pain    Currently in Pain?  Yes    Pain Score  8     Pain Location  Shoulder    Pain Orientation  Left    Pain Descriptors / Indicators  Aching    Pain Type  Acute pain    Pain Onset  More than a month ago    Pain Frequency  Intermittent        TREATMENT  Manual Therapy: STM performed to patient's upper trap, deltoids. And shoulder ERs on the affected side to decrease increased spasms and pain with shoulder flexion with patient in supine  Therapeutic Exercise:  AAROM shoulder  flexion in supine -- x20 with therapist support AAROM shoulder flexion with patient with interlaced fingers -- x 20  AAROM shoulder ER/IR in supine - 2 x 20 Limits of stability with shoulder at 90 deg - x 30sec  Isometrics at wall: Standing shoulder ER with 5 sec holds - x 10  Standing shoulder flexion with 5 sec holds - x10 Supine shoulder ER/IR -- x 20   Patient demonstrates increased fatigue at the end of the session    PT Education - 07/29/17 1409    Education Details  HEP: shoulder flexion iso, AAROM shoulder flexion     Person(s) Educated  Patient    Methods  Explanation;Demonstration    Comprehension  Verbalized understanding;Returned demonstration          PT Long Term Goals - 07/19/17 1112      PT LONG TERM GOAL #1   Title  Patient will improve AROM to 120 degrees left shoulder with mild discomfort for reaching into cabinet or performing hair care     Baseline  95 AROM in sitting     Time  8    Period  Weeks    Status  New      PT LONG TERM GOAL #2   Title  Patient will improve all shoulder strength 4+/5 for flexion and rotation to improve ability to manipulate objects in space.     Baseline  3/5 - 3+/5 within all measured motions    Time  8    Period  Weeks    Status  New    Target Date  09/13/17      PT LONG TERM GOAL #3   Title  Patient will  be independent with home program for strength and pain control for left UE/shoulder by 04/17/2015    Baseline  limited knowledge of pain control strategies or progression of exercises for left UE    Time  8    Period  Weeks    Status  New    Target Date  09/13/17            Plan - 07/29/17 1411    Clinical Impression Statement  Patient demonstrates increased guarding and muscular spasms along the upper trap and deltoid musculature. Patient demonstrates significant weakness with shoulder ERs and requires therpist assistance to perform shoulder ER without increase in pain. Patient will benefit from furhter  skilled therapy focuses on improving shoulder strength to return to prior level of function.     Rehab Potential  Fair    Clinical Impairments Affecting Rehab Potential  (+) highly motivated, (-) age, decreased strength    PT Frequency  2x / week    PT Duration  8 weeks    PT Treatment/Interventions  Passive range of motion;Manual techniques;Electrical Stimulation;Cryotherapy;Moist Heat;Ultrasound;Therapeutic exercise;Therapeutic activities;Neuromuscular re-education;Patient/family education;Scar mobilization;Dry needling;Iontophoresis 4mg /ml Dexamethasone    PT Next Visit Plan  progress AROM exercises    PT Home Exercise Plan  See education section    Consulted and Agree with Plan of Care  Patient       Patient will benefit from skilled therapeutic intervention in order to improve the following deficits and impairments:  Pain, Impaired sensation, Decreased coordination, Decreased mobility, Increased muscle spasms, Decreased activity tolerance, Decreased endurance, Decreased range of motion, Decreased strength  Visit Diagnosis: Acute pain of left shoulder  Stiffness of left shoulder, not elsewhere classified  Muscle weakness (generalized)     Problem List There are no active problems to display for this patient.   Gerri SporeWesley Tessla Spurling 07/29/2017, 2:15 PM  [place] [facility] [address] [place], [place], [address] Phone: [phone]   Fax:  [phone]  Name: Suzanne Snyder MRN: [id number] Date of Birth: [birth year]

## 2017-08-04 ENCOUNTER — Ambulatory Visit: Payer: Medicare Other

## 2017-08-04 DIAGNOSIS — M25612 Stiffness of left shoulder, not elsewhere classified: Secondary | ICD-10-CM

## 2017-08-04 DIAGNOSIS — M25512 Pain in left shoulder: Secondary | ICD-10-CM | POA: Diagnosis not present

## 2017-08-04 DIAGNOSIS — M6281 Muscle weakness (generalized): Secondary | ICD-10-CM

## 2017-08-04 NOTE — Therapy (Signed)
[place] [facility] [address], [place], [address] Phone: [phone]   Fax:  [phone]  Physical Therapy Treatment  Patient Details  Name: Suzanne Snyder MRN: [id number] Date of Birth: 02/12/1950 Referring Provider: Olga Millers MD   Encounter Date: 08/04/2017  PT End of Session - 08/04/17 1025    Visit Number  4    Number of Visits  13    Date for PT Re-Evaluation  09/13/17    Authorization Type  4 /10 Medicare    PT Start Time  0900    PT Stop Time  0945    PT Time Calculation (min)  45 min    Activity Tolerance  Patient tolerated treatment well    Behavior During Therapy  [facility] for tasks assessed/performed       Past Medical History:  Diagnosis Date  . Anxiety   . Diabetes mellitus without complication (HCC)   . Osteoporosis     Past Surgical History:  Procedure Laterality Date  . 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 ROTATOR CUFF REPAIR Left 2014 and 2015  . SPINE SURGERY N/A 1984   L4-5, not sure of procedure    There were no vitals filed for this visit.  Subjective Assessment - 08/04/17 0904    Subjective  Patient reports she has had increased shoulder pain along her L shoulder since climbing up and down a ladder. She states she has had difficulty sleeping and having pain radiaiting down her L arm into her forearm.     Pertinent History  Chronic history of L shoulder pain     Limitations  Lifting    Patient Stated Goals  To improve shoulder function and decrease pain    Currently in Pain?  Yes    Pain Score  8     Pain Location  Shoulder    Pain Orientation  Left    Pain Descriptors / Indicators  Aching    Pain Type  Acute pain    Pain Onset  More than a month ago    Pain Frequency  Intermittent       TREATMENT   Manual Therapy: STM performed to patient's upper trap, deltoids. And shoulder ERs on the affected side to decrease increased spasms and pain with shoulder flexion with  patient in supine and sitting Sidelying scapular mobilizations depression, downward/upward rotation,retraction - 5 x 20sec in each direction    Therapeutic Exercise:  AAROM shoulder flexion with patient with interlaced fingers -- x 20 with 1# weight AAROM shoulder ER/IR in supine - x 20 Sidelying Shoulder ER holds - x5 with 10sec holds  Sidelying scapular depression against manual resistance - x 20 with 2 sec holds  Patient demonstrates increased fatigue at the end of the session   PT Education - 08/04/17 1017    Education Details  HEP: Shoulder AAROM with 1# weight     Person(s) Educated  Patient    Methods  Explanation;Demonstration    Comprehension  Verbalized understanding;Returned demonstration          PT Long Term Goals - 07/19/17 1112      PT LONG TERM GOAL #1   Title  Patient will improve AROM to 120 degrees left shoulder with mild discomfort for reaching into cabinet or performing hair care     Baseline  95 AROM in sitting     Time  8    Period  Weeks  Status  New      PT LONG TERM GOAL #2   Title  Patient will improve all shoulder strength 4+/5 for flexion and rotation to improve ability to manipulate objects in space.     Baseline  3/5 - 3+/5 within all measured motions    Time  8    Period  Weeks    Status  New    Target Date  09/13/17      PT LONG TERM GOAL #3   Title  Patient will  be independent with home program for strength and pain control for left UE/shoulder by 04/17/2015    Baseline  limited knowledge of pain control strategies or progression of exercises for left UE    Time  8    Period  Weeks    Status  New    Target Date  09/13/17            Plan - 08/04/17 1026    Clinical Impression Statement  Patient demonstrates singificant decrease in pain and spasms after performing manual therapy indicating decreased pain and spasms along the shoulder musculature. Patient demonstrates improvement in shoulder AAROM flexion at end of the session.  Patient will benefit from further skilled therapy to return to prior level of function.     Rehab Potential  Fair    Clinical Impairments Affecting Rehab Potential  (+) highly motivated, (-) age, decreased strength    PT Frequency  2x / week    PT Duration  8 weeks    PT Treatment/Interventions  Passive range of motion;Manual techniques;Electrical Stimulation;Cryotherapy;Moist Heat;Ultrasound;Therapeutic exercise;Therapeutic activities;Neuromuscular re-education;Patient/family education;Scar mobilization;Dry needling;Iontophoresis 4mg /ml Dexamethasone    PT Next Visit Plan  progress AROM exercises    PT Home Exercise Plan  See education section    Consulted and Agree with Plan of Care  Patient       Patient will benefit from skilled therapeutic intervention in order to improve the following deficits and impairments:  Pain, Impaired sensation, Decreased coordination, Decreased mobility, Increased muscle spasms, Decreased activity tolerance, Decreased endurance, Decreased range of motion, Decreased strength  Visit Diagnosis: Acute pain of left shoulder  Stiffness of left shoulder, not elsewhere classified  Muscle weakness (generalized)     Problem List There are no active problems to display for this patient.   Myrene GalasWesley Teyah Rossy, PT DPT 08/04/2017, 10:36 AM  [place] [facility] [address] [place], [place], [address] Phone: [phone]   Fax:  [phone]  Name: Suzanne Snyder MRN: [id number] Date of Birth: 1950-05-02

## 2017-08-05 ENCOUNTER — Ambulatory Visit: Payer: Medicare Other

## 2017-08-05 DIAGNOSIS — M25512 Pain in left shoulder: Secondary | ICD-10-CM | POA: Diagnosis not present

## 2017-08-05 DIAGNOSIS — M6281 Muscle weakness (generalized): Secondary | ICD-10-CM

## 2017-08-05 DIAGNOSIS — M25612 Stiffness of left shoulder, not elsewhere classified: Secondary | ICD-10-CM

## 2017-08-05 NOTE — Therapy (Signed)
[place] [facility] [address] [place], [place], [address] Phone: [phone]   Fax:  [phone]  Physical Therapy Treatment  Patient Details  Name: Suzanne Snyder MRN: [id number] Date of Birth: [birth year] Referring Provider: Olga MillersKamath MD   Encounter Date: 08/05/2017  PT End of Session - 08/05/17 1631    Visit Number  5    Number of Visits  13    Date for PT Re-Evaluation  09/13/17    Authorization Type  5 /10 Medicare    PT Start Time  1515    PT Stop Time  1600    PT Time Calculation (min)  45 min    Activity Tolerance  Patient tolerated treatment well    Behavior During Therapy  [facility] for tasks assessed/performed       Past Medical History:  Diagnosis Date  . Anxiety   . Diabetes mellitus without complication (HCC)   . Osteoporosis     Past Surgical History:  Procedure Laterality Date  . 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 ROTATOR CUFF REPAIR Left 2014 and 2015  . SPINE SURGERY N/A 1984   L4-5, not sure of procedure    There were no vitals filed for this visit.  Subjective Assessment - 08/05/17 1628    Subjective  Patient states her shoulder pain felt better after the previous session and was able to sleep better. Patient reports she has had increased pain with raising her arm overhead and lifting an item out of a high microwave.     Pertinent History  Chronic history of L shoulder pain     Limitations  Lifting    Patient Stated Goals  To improve shoulder function and decrease pain    Currently in Pain?  Yes    Pain Score  3     Pain Location  Shoulder    Pain Orientation  Left    Pain Descriptors / Indicators  Aching    Pain Type  Acute pain    Pain Onset  More than a month ago    Pain Frequency  Intermittent        TREATMENT  Manual Therapy: STM performed to patient's upper trap, deltoids. And shoulder ERs on the affected side to decrease increased spasms and pain with shoulder  flexion with patient in supine and sitting Sidelying scapular mobilizations depression, downward/upward rotation,retraction - 3 x 20sec in each direction   Therapeutic Exercise:  AAROM shoulder flexion with patient with interlaced fingers --2 x 20 Shoulder Flexion in standing with UE ranger -- x 20  Shoulder ER/IR in standing with UE ranger -- x 20  Supine scapular retraction in sitting -- x 20 2 sec holds   Patient demonstrates increased fatigue at the end of the session    PT Education - 08/05/17 1631    Education Details  Reinforced HEP shoulder flexion in prone    Person(s) Educated  Patient    Methods  Explanation;Demonstration    Comprehension  Verbalized understanding;Returned demonstration          PT Long Term Goals - 07/19/17 1112      PT LONG TERM GOAL #1   Title  Patient will improve AROM to 120 degrees left shoulder with mild discomfort for reaching into cabinet or performing hair care     Baseline  95 AROM in sitting     Time  8    Period  Weeks  Status  New      PT LONG TERM GOAL #2   Title  Patient will improve all shoulder strength 4+/5 for flexion and rotation to improve ability to manipulate objects in space.     Baseline  3/5 - 3+/5 within all measured motions    Time  8    Period  Weeks    Status  New    Target Date  09/13/17      PT LONG TERM GOAL #3   Title  Patient will  be independent with home program for strength and pain control for left UE/shoulder by 04/17/2015    Baseline  limited knowledge of pain control strategies or progression of exercises for left UE    Time  8    Period  Weeks    Status  New    Target Date  09/13/17            Plan - 08/05/17 1632    Clinical Impression Statement  Patient demonstrates early onset of fatigue during therapy session thus performed less exercises compared to previous visit. Patient demonstrates decreased pain after performing manual therapy indicating decreased muscular spasms and pain. Was  able to do greater ER/IR shoulder motions in standing compared to previous visits. Patient will benefit from further skilled therapy to return to prior level of function.     Rehab Potential  Fair    Clinical Impairments Affecting Rehab Potential  (+) highly motivated, (-) age, decreased strength    PT Frequency  2x / week    PT Duration  8 weeks    PT Treatment/Interventions  Passive range of motion;Manual techniques;Electrical Stimulation;Cryotherapy;Moist Heat;Ultrasound;Therapeutic exercise;Therapeutic activities;Neuromuscular re-education;Patient/family education;Scar mobilization;Dry needling;Iontophoresis 4mg /ml Dexamethasone    PT Next Visit Plan  progress AROM exercises    PT Home Exercise Plan  See education section    Consulted and Agree with Plan of Care  Patient       Patient will benefit from skilled therapeutic intervention in order to improve the following deficits and impairments:  Pain, Impaired sensation, Decreased coordination, Decreased mobility, Increased muscle spasms, Decreased activity tolerance, Decreased endurance, Decreased range of motion, Decreased strength  Visit Diagnosis: Acute pain of left shoulder  Stiffness of left shoulder, not elsewhere classified  Muscle weakness (generalized)     Problem List There are no active problems to display for this patient.   [name], PT DPT 08/05/2017, 4:39 PM  [place] [facility] SPORTS MEDIC[facility] [address], [place], [address] Phone: [phone]   Fax:  [phone]  Name: Suzanne Snyder MRN: [id number] Date of Birth: [birth year]

## 2017-08-09 ENCOUNTER — Ambulatory Visit: Payer: Medicare Other

## 2017-08-12 ENCOUNTER — Ambulatory Visit: Payer: Medicare Other | Attending: Orthopedic Surgery

## 2017-08-12 DIAGNOSIS — M25612 Stiffness of left shoulder, not elsewhere classified: Secondary | ICD-10-CM

## 2017-08-12 DIAGNOSIS — M6281 Muscle weakness (generalized): Secondary | ICD-10-CM

## 2017-08-12 DIAGNOSIS — M25512 Pain in left shoulder: Secondary | ICD-10-CM | POA: Insufficient documentation

## 2017-08-12 NOTE — Therapy (Signed)
[facility] [address] [place], [address] Phone: [phone]   Fax:  [phone]  Physical Therapy Treatment  Patient Details  Name: Suzanne Snyder MRN: [id number] Date of Birth: 1950/10/22 Referring Provider: Olga Millers MD   Encounter Date: 08/12/2017  PT End of Session - 08/12/17 1735    Visit Number  6    Number of Visits  13    Date for PT Re-Evaluation  09/13/17    Authorization Type  6 /10 Medicare    PT Start Time  1600    PT Stop Time  1645    PT Time Calculation (min)  45 min    Activity Tolerance  Patient tolerated treatment well    Behavior During Therapy  [facility] for tasks assessed/performed       Past Medical History:  Diagnosis Date  . Anxiety   . Diabetes mellitus without complication (HCC)   . Osteoporosis     Past Surgical History:  Procedure Laterality Date  . 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 ROTATOR CUFF REPAIR Left 2014 and 2015  . SPINE SURGERY N/A 1984   L4-5, not sure of procedure    There were no vitals filed for this visit.  Subjective Assessment - 08/12/17 1728    Subjective  Patient states her shoulder pain has not been as intense as previous session but reports her shoulder was aggravated after she had a fall on Monday. Patient states no LOC after the fall. Patient also reports her daughter 'twisted her arm' which also aggravated the pain.     Pertinent History  Chronic history of L shoulder pain     Limitations  Lifting    Patient Stated Goals  To improve shoulder function and decrease pain    Currently in Pain?  Yes    Pain Score  3     Pain Location  Shoulder    Pain Orientation  Left    Pain Descriptors / Indicators  Aching    Pain Type  Acute pain    Pain Onset  More than a month ago    Pain Frequency  Intermittent       TREATMENT  Manual Therapy: STM performed to patient's upper trap, deltoids. And shoulder ERs on the affected side to  decrease increased spasms and pain with shoulder flexion with patient in supine and sitting Sidelying scapular mobilizations depression, downward/upward rotation,retraction - 3 x 20sec in each direction   Therapeutic Exercise:  Shoulder Flexion in standing with UE ranger -- x 20  Shoulder ER/IR in standing with UE ranger -- x 20  Scapular depression in sidelying -- x 20 3 sec holds with manual resistance Scapular retraction in sidelying -- x 20 3 sec holds with manual resistance Sidelying Shoulder ER holds -- x 20   Patient demonstrates increased fatigue at the end of the session    PT Education - 08/12/17 1733    Education Details  Sidelying shoulder ER with HEP    Person(s) Educated  Patient    Methods  Explanation;Demonstration    Comprehension  Verbalized understanding;Returned demonstration          PT Long Term Goals - 07/19/17 1112      PT LONG TERM GOAL #1   Title  Patient will improve AROM to 120 degrees left shoulder with mild discomfort for reaching into cabinet or performing hair care     Baseline  95 AROM in  sitting     Time  8    Period  Weeks    Status  New      PT LONG TERM GOAL #2   Title  Patient will improve all shoulder strength 4+/5 for flexion and rotation to improve ability to manipulate objects in space.     Baseline  3/5 - 3+/5 within all measured motions    Time  8    Period  Weeks    Status  New    Target Date  09/13/17      PT LONG TERM GOAL #3   Title  Patient will  be independent with home program for strength and pain control for left UE/shoulder by 04/17/2015    Baseline  limited knowledge of pain control strategies or progression of exercises for left UE    Time  8    Period  Weeks    Status  New    Target Date  09/13/17            Plan - 08/12/17 1811    Clinical Impression Statement  Patient demonstrates improvement with ability to perform x10 sidelying shoulder ER with minor cueing for technique. Patient is also able to  perform greater amount of shoulder flexion without increase in pain. Patient demonstrates decreased strength and poo muscular control; most notably between humeral and scpaular positioning. Patient will benefit from further skilled therapy to return to prior level of function.     Rehab Potential  Fair    Clinical Impairments Affecting Rehab Potential  (+) highly motivated, (-) age, decreased strength    PT Frequency  2x / week    PT Duration  8 weeks    PT Treatment/Interventions  Passive range of motion;Manual techniques;Electrical Stimulation;Cryotherapy;Moist Heat;Ultrasound;Therapeutic exercise;Therapeutic activities;Neuromuscular re-education;Patient/family education;Scar mobilization;Dry needling;Iontophoresis 4mg /ml Dexamethasone    PT Next Visit Plan  progress AROM exercises    PT Home Exercise Plan  See education section    Consulted and Agree with Plan of Care  Patient       Patient will benefit from skilled therapeutic intervention in order to improve the following deficits and impairments:  Pain, Impaired sensation, Decreased coordination, Decreased mobility, Increased muscle spasms, Decreased activity tolerance, Decreased endurance, Decreased range of motion, Decreased strength  Visit Diagnosis: Acute pain of left shoulder  Stiffness of left shoulder, not elsewhere classified  Muscle weakness (generalized)     Problem List There are no active problems to display for this patient.   Myrene GalasWesley Jeyda Siebel, PT DPT 08/12/2017, 6:22 PM  [facility] [address] [place], [place], [address] Phone: [phone]   Fax:  [phone]  Name: Suzanne Snyder MRN: [id number] Date of Birth: March 24, 1950

## 2017-08-16 ENCOUNTER — Ambulatory Visit: Payer: Medicare Other

## 2017-08-18 ENCOUNTER — Ambulatory Visit: Payer: Medicare Other

## 2017-08-25 ENCOUNTER — Ambulatory Visit: Payer: Medicare Other

## 2017-08-31 ENCOUNTER — Ambulatory Visit: Payer: Medicare Other

## 2017-09-02 ENCOUNTER — Ambulatory Visit: Payer: Medicare Other

## 2017-09-06 ENCOUNTER — Ambulatory Visit: Payer: Medicare Other

## 2017-09-06 DIAGNOSIS — M25512 Pain in left shoulder: Secondary | ICD-10-CM

## 2017-09-06 DIAGNOSIS — M6281 Muscle weakness (generalized): Secondary | ICD-10-CM

## 2017-09-06 DIAGNOSIS — M25612 Stiffness of left shoulder, not elsewhere classified: Secondary | ICD-10-CM

## 2017-09-06 NOTE — Therapy (Signed)
[facility] [address] [place], [place], [address] Phone: [phone]   Fax:  [phone]  Physical Therapy Treatment  Patient Details  Name: Suzanne Snyder MRN: [id number] Date of Birth: [birth year] Referring Provider: Olga MillersKamath MD   Encounter Date: 09/06/2017  PT End of Session - 09/06/17 1625    Visit Number  7    Number of Visits  13    Date for PT Re-Evaluation  09/13/17    Authorization Type  7 /10 Medicare    PT Start Time  1600    PT Stop Time  1645    PT Time Calculation (min)  45 min    Activity Tolerance  Patient tolerated treatment well    Behavior During Therapy  [facility] for tasks assessed/performed       Past Medical History:  Diagnosis Date  . Anxiety   . Diabetes mellitus without complication (HCC)   . Osteoporosis     Past Surgical History:  Procedure Laterality Date  . 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 ROTATOR CUFF REPAIR Left 2014 and 2015  . SPINE SURGERY N/A 1984   L4-5, not sure of procedure    There were no vitals filed for this visit.  Subjective Assessment - 09/06/17 1531    Subjective  Patient reports her shoulder was "not good" and states she has been sick for the past two weeks. Patient states increased pain with lifting arm and push up  from a seated position.     Pertinent History  Chronic history of L shoulder pain     Limitations  Lifting    Patient Stated Goals  To improve shoulder function and decrease pain    Currently in Pain?  Yes    Pain Score  4     Pain Location  Shoulder    Pain Orientation  Left    Pain Descriptors / Indicators  Aching    Pain Type  Chronic pain    Pain Onset  More than a month ago    Pain Frequency  Intermittent         TREATMENT   Manual Therapy: STM performed to patient's upper trap, deltoids. And shoulder ERs on the affected side to decrease increased spasms and pain with shoulder flexion with patient in supine and  sitting Sidelying scapular mobilizations depression, downward/upward rotation,retraction - 3 x 20sec in each direction    Therapeutic Exercise:   Scapular depression in sidelying -- x 20 3 sec holds with manual resistance Scapular retraction in sidelying -- x 20 3 sec holds with manual resistance Sidelying Shoulder ER in sidelying -- x 20  Sidelying shoulder flexion with Therapist assistance - x 20  Isometrics shoulder flexion/abduction in standing - 3 x 10 sec holds B    Patient demonstrates increased fatigue at the end of the session    PT Education - 09/06/17 1625    Education Details  scapular retraction in standing    Person(s) Educated  Patient    Methods  Explanation;Demonstration    Comprehension  Verbalized understanding;Returned demonstration          PT Long Term Goals - 07/19/17 1112      PT LONG TERM GOAL #1   Title  Patient will improve AROM to 120 degrees left shoulder with mild discomfort for reaching into cabinet or performing hair care     Baseline  95 AROM in sitting     Time  8    Period  Weeks    Status  New      PT LONG TERM GOAL #2   Title  Patient will improve all shoulder strength 4+/5 for flexion and rotation to improve ability to manipulate objects in space.     Baseline  3/5 - 3+/5 within all measured motions    Time  8    Period  Weeks    Status  New    Target Date  09/13/17      PT LONG TERM GOAL #3   Title  Patient will  be independent with home program for strength and pain control for left UE/shoulder by 04/17/2015    Baseline  limited knowledge of pain control strategies or progression of exercises for left UE    Time  8    Period  Weeks    Status  New    Target Date  09/13/17            Plan - 09/06/17 1630    Clinical Impression Statement  Patient demonstrates significant muscular guarding with exercises requiring AAROM from therpist to perform exercises indicating poor strength and coordination. Patient demonstrates fear  with all shoulder movements and demosntrates significant decrease in strength overall. Patient will benefit from further skilled therapy to return to prior level of function.     Rehab Potential  Fair    Clinical Impairments Affecting Rehab Potential  (+) highly motivated, (-) age, decreased strength    PT Frequency  2x / week    PT Duration  8 weeks    PT Treatment/Interventions  Passive range of motion;Manual techniques;Electrical Stimulation;Cryotherapy;Moist Heat;Ultrasound;Therapeutic exercise;Therapeutic activities;Neuromuscular re-education;Patient/family education;Scar mobilization;Dry needling;Iontophoresis 4mg /ml Dexamethasone    PT Next Visit Plan  progress AROM exercises    PT Home Exercise Plan  See education section    Consulted and Agree with Plan of Care  Patient       Patient will benefit from skilled therapeutic intervention in order to improve the following deficits and impairments:  Pain, Impaired sensation, Decreased coordination, Decreased mobility, Increased muscle spasms, Decreased activity tolerance, Decreased endurance, Decreased range of motion, Decreased strength  Visit Diagnosis: Acute pain of left shoulder  Stiffness of left shoulder, not elsewhere classified  Muscle weakness (generalized)     Problem List There are no active problems to display for this patient.   [name], PT DPT 09/06/2017, 4:47 PM   [facility] PHYSICAL AND SPORTS MEDICINE [address], [place], [address] Phone: [phone]   Fax:  [phone]  Name: Suzanne Snyder MRN: [id number] Date of Birth: 07/15/1950

## 2017-09-08 ENCOUNTER — Ambulatory Visit: Payer: Medicare Other

## 2017-09-08 DIAGNOSIS — M25612 Stiffness of left shoulder, not elsewhere classified: Secondary | ICD-10-CM

## 2017-09-08 DIAGNOSIS — M6281 Muscle weakness (generalized): Secondary | ICD-10-CM

## 2017-09-08 DIAGNOSIS — M25512 Pain in left shoulder: Secondary | ICD-10-CM

## 2017-09-08 NOTE — Therapy (Signed)
[facility] [address], [place], [address] Phone: [phone]   Fax:  [phone]  Physical Therapy Treatment  Patient Details  Name: Suzanne Snyder MRN: [id number] Date of Birth: 03/19/1950 Referring Provider: Olga Millers MD   Encounter Date: 09/08/2017  PT End of Session - 09/08/17 1432    Visit Number  8    Number of Visits  13    Date for PT Re-Evaluation  09/13/17    Authorization Type  8 /10 Medicare    PT Start Time  1345    PT Stop Time  1430    PT Time Calculation (min)  45 min    Activity Tolerance  Patient tolerated treatment well    Behavior During Therapy  [facility] for tasks assessed/performed       Past Medical History:  Diagnosis Date  . Anxiety   . Diabetes mellitus without complication (HCC)   . Osteoporosis     Past Surgical History:  Procedure Laterality Date  . 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 ROTATOR CUFF REPAIR Left 2014 and 2015  . SPINE SURGERY N/A 1984   L4-5, not sure of procedure    There were no vitals filed for this visit.  Subjective Assessment - 09/08/17 1356    Subjective  Patient reports she has folded and washed her sheet and comforter today which she reports has been exercise to her shoulder.     Pertinent History  Chronic history of L shoulder pain     Limitations  Lifting    Patient Stated Goals  To improve shoulder function and decrease pain    Currently in Pain?  Yes    Pain Score  3     Pain Location  Shoulder    Pain Orientation  Left    Pain Descriptors / Indicators  Aching    Pain Type  Chronic pain    Pain Onset  More than a month ago    Pain Frequency  Intermittent       TREATMENT   Manual Therapy: STM performed to patient's upper trap, deltoids. And shoulder ERs on the affected side to decrease increased spasms and pain with shoulder flexion with patient in supine and sitting Sidelying scapular mobilizations depression,  downward/upward rotation,retraction - 3 x 20sec in each direction    Therapeutic Exercise:   Scapular depression in sidelying -- x 20 3 sec holds with manual resistance Sidelying Shoulder ER in sidelying - 2 x 20  Sidelying shoulder flexion with Therapist assistance - 2 x 20  UE ranger flexion - 2 x 10  Isometrics shoulder abduction in standing - 2 x 10 sec holds B    Patient demonstrates increased fatigue at the end of the session  --   PT Education - 09/08/17 1431    Education Details  Shoulder flexion at the wall     Person(s) Educated  Patient    Methods  Explanation;Demonstration    Comprehension  Verbalized understanding;Returned demonstration          PT Long Term Goals - 07/19/17 1112      PT LONG TERM GOAL #1   Title  Patient will improve AROM to 120 degrees left shoulder with mild discomfort for reaching into cabinet or performing hair care     Baseline  95 AROM in sitting     Time  8    Period  Weeks    Status  New  PT LONG TERM GOAL #2   Title  Patient will improve all shoulder strength 4+/5 for flexion and rotation to improve ability to manipulate objects in space.     Baseline  3/5 - 3+/5 within all measured motions    Time  8    Period  Weeks    Status  New    Target Date  09/13/17      PT LONG TERM GOAL #3   Title  Patient will  be independent with home program for strength and pain control for left UE/shoulder by 04/17/2015    Baseline  limited knowledge of pain control strategies or progression of exercises for left UE    Time  8    Period  Weeks    Status  New    Target Date  09/13/17            Plan - 09/08/17 1432    Clinical Impression Statement  Patient demonstrates decreased guarding today versus previous session indicating improvement in motor control and functional carryover between sessions. Although patient is improving, she continues to have decreased strength most notably with ER and shoulder flexion. Continued to address  strength limitations with todays session. Patient will benefit from further skilled therapy to return to prior level of function.     Rehab Potential  Fair    Clinical Impairments Affecting Rehab Potential  (+) highly motivated, (-) age, decreased strength    PT Frequency  2x / week    PT Duration  8 weeks    PT Treatment/Interventions  Passive range of motion;Manual techniques;Electrical Stimulation;Cryotherapy;Moist Heat;Ultrasound;Therapeutic exercise;Therapeutic activities;Neuromuscular re-education;Patient/family education;Scar mobilization;Dry needling;Iontophoresis 4mg /ml Dexamethasone    PT Next Visit Plan  progress AROM exercises    PT Home Exercise Plan  See education section    Consulted and Agree with Plan of Care  Patient       Patient will benefit from skilled therapeutic intervention in order to improve the following deficits and impairments:  Pain, Impaired sensation, Decreased coordination, Decreased mobility, Increased muscle spasms, Decreased activity tolerance, Decreased endurance, Decreased range of motion, Decreased strength  Visit Diagnosis: Acute pain of left shoulder  Stiffness of left shoulder, not elsewhere classified  Muscle weakness (generalized)     Problem List There are no active problems to display for this patient.   Myrene GalasWesley Michaeljohn Biss, PT DPT 09/08/2017, 2:37 PM  [place] [address] [place], [place], [address] Phone: [phone]   Fax:  [phone]  Name: Henreitta LeberDeborah L Eifert MRN: [id number] Date of Birth: 1950/01/20

## 2017-09-14 ENCOUNTER — Ambulatory Visit: Payer: Medicare Other | Attending: Orthopedic Surgery

## 2017-09-14 DIAGNOSIS — M25512 Pain in left shoulder: Secondary | ICD-10-CM | POA: Diagnosis present

## 2017-09-14 DIAGNOSIS — G8929 Other chronic pain: Secondary | ICD-10-CM | POA: Insufficient documentation

## 2017-09-14 DIAGNOSIS — M25612 Stiffness of left shoulder, not elsewhere classified: Secondary | ICD-10-CM | POA: Insufficient documentation

## 2017-09-14 DIAGNOSIS — M6281 Muscle weakness (generalized): Secondary | ICD-10-CM | POA: Diagnosis present

## 2017-09-14 NOTE — Therapy (Signed)
[facility]TS MED[facility] [address] [place], [address] Phone: [phone]   Fax:  [phone]  Physical Therapy Treatment  Patient Details  Name: Suzanne Snyder MRN: [id number] Date of Birth: 1950/08/08 Referring Provider: Olga Millers MD   Encounter Date: 09/14/2017  PT End of Session - 09/14/17 1337    Visit Number  9    Number of Visits  26    Date for PT Re-Evaluation  09/13/17    Authorization Type  9 /10 Medicare (1/10 [place])    PT Start Time  1115    PT Stop Time  1200    PT Time Calculation (min)  45 min    Activity Tolerance  Patient tolerated treatment well    Behavior During Therapy  [facility] for tasks assessed/performed       Past Medical History:  Diagnosis Date  . Anxiety   . Diabetes mellitus without complication (HCC)   . Osteoporosis     Past Surgical History:  Procedure Laterality Date  . 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 ROTATOR CUFF REPAIR Left 2014 and 2015  . SPINE SURGERY N/A 1984   L4-5, not sure of procedure    There were no vitals filed for this visit.  Subjective Assessment - 09/14/17 1147    Subjective  Patient reports she was up all night because she accidently delivered too much insulin. Patient reports she has been having difficulty secondary to her daughter not coming home at night and worrying about her.     Pertinent History  Chronic history of L shoulder pain     Limitations  Lifting    Patient Stated Goals  To improve shoulder function and decrease pain    Currently in Pain?  Yes    Pain Score  5     Pain Location  Shoulder    Pain Orientation  Right;Left    Pain Descriptors / Indicators  Aching    Pain Type  Chronic pain    Pain Onset  More than a month ago    Pain Frequency  Intermittent          TREATMENT  Manual Therapy: STM performed to patient's upper trap, deltoids. And shoulder ERs on the affected side to decrease increased spasms and pain with  shoulder flexion with patient in supine and sitting Sidelying scapular mobilizations depression, downward/upward rotation,retraction -3x 20sec in each direction   Therapeutic Exercise:  Scapulardepressioninsidelying-- x 203sec holds with manual resistance Sidelying Shoulder ER in sidelying - 2 x 20 Sidelying shoulder flexion with Therapist assistance - 2 x 20  UE ranger flexion in standing with ranger on floor -  x 10  UE ranger IR/ER in standing with ranger on floor -- x 10 Standing shoulder rows at OMEGA -- x 20 #5   Patient demonstrates increased fatigue at the end of the session   PT Education - 09/14/17 1326    Education Details  Education to maintain performing functional activities with the affect UE     Person(s) Educated  Patient    Methods  Explanation;Demonstration    Comprehension  Verbalized understanding;Returned demonstration          PT Long Term Goals - 09/14/17 1327      PT LONG TERM GOAL #1   Title  Patient will improve AROM to 120 degrees left shoulder with mild discomfort for reaching into cabinet or performing hair care     Baseline  95 AROM in sitting; 09/14/2017: 105 deg    Time  8    Period  Weeks    Status  On-going      PT LONG TERM GOAL #2   Title  Patient will improve all shoulder strength 4+/5 for flexion and rotation to improve ability to manipulate objects in space.     Baseline  3/5 - 3+/5 within all measured motions; 09/14/2017: 3+/5 with all motion    Time  8    Period  Weeks    Status  On-going      PT LONG TERM GOAL #3   Title  Patient will  be independent with home program for strength and pain control for left UE/shoulder by 04/17/2015    Baseline  limited knowledge of pain control strategies or progression of exercises for left UE; 09/14/2017: Requires moderate cueing for exercise progression    Time  8    Period  Weeks    Status  On-going            Plan - 09/14/17 1329    Clinical Impression Statement  Patient is  making progress towards long term goals with improved ability to perform shoulder flexion on the affected side with less pain and greater AROM. Although patient is improving, she continues to have  significant pain with rotational movement and end range flexion secondary to decreased in muscular strength and coordination. Patient demonstrates slight improvement in strength, but overall weakness throughout the UE. Patient will benefit from further skilled therapy to return to prior level of function.     Rehab Potential  Fair    Clinical Impairments Affecting Rehab Potential  (+) highly motivated, (-) age, decreased strength    PT Frequency  2x / week    PT Duration  8 weeks    PT Treatment/Interventions  Passive range of motion;Manual techniques;Electrical Stimulation;Cryotherapy;Moist Heat;Ultrasound;Therapeutic exercise;Therapeutic activities;Neuromuscular re-education;Patient/family education;Scar mobilization;Dry needling;Iontophoresis 4mg /ml Dexamethasone    PT Next Visit Plan  progress AROM exercises    PT Home Exercise Plan  See education section    Consulted and Agree with Plan of Care  Patient       Patient will benefit from skilled therapeutic intervention in order to improve the following deficits and impairments:  Pain, Impaired sensation, Decreased coordination, Decreased mobility, Increased muscle spasms, Decreased activity tolerance, Decreased endurance, Decreased range of motion, Decreased strength  Visit Diagnosis: Stiffness of left shoulder, not elsewhere classified  Chronic left shoulder pain  Muscle weakness (generalized)     Problem List There are no active problems to display for this patient.   [name], PT DPT 09/14/2017, 1:38 PM  [facility] PHYSICAL AND SPORTS MEDICINE [address], [place], [address] Phone: [phone]   Fax:  [phone]  Name: Suzanne Snyder MRN: [id number] Date of Birth:  16-Dec-1950

## 2017-09-20 ENCOUNTER — Ambulatory Visit: Payer: Medicare Other

## 2017-09-20 DIAGNOSIS — M6281 Muscle weakness (generalized): Secondary | ICD-10-CM

## 2017-09-20 DIAGNOSIS — M25612 Stiffness of left shoulder, not elsewhere classified: Secondary | ICD-10-CM

## 2017-09-20 DIAGNOSIS — M25512 Pain in left shoulder: Principal | ICD-10-CM

## 2017-09-20 DIAGNOSIS — G8929 Other chronic pain: Secondary | ICD-10-CM

## 2017-09-20 NOTE — Therapy (Signed)
[place] [facility] [address] [place], [address] Phone: [phone]   Fax:  [phone]  Physical Therapy Treatment  Patient Details  Name: Suzanne Snyder MRN: [id number] Date of Birth: [birth year] Referring Provider: Olga Millers MD   Encounter Date: 09/20/2017  PT End of Session - 09/20/17 1319    Visit Number  9    Number of Visits  26    Date for PT Re-Evaluation  10/25/17    Authorization Type  1/ [employer]    PT Start Time  1130    PT Stop Time  1200    PT Time Calculation (min)  30 min    Activity Tolerance  Patient tolerated treatment well    Behavior During Therapy  [facility] for tasks assessed/performed       Past Medical History:  Diagnosis Date  . Anxiety   . Diabetes mellitus without complication (HCC)   . Osteoporosis     Past Surgical History:  Procedure Laterality Date  . 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 ROTATOR CUFF REPAIR Left 2014 and 2015  . SPINE SURGERY N/A 1984   L4-5, not sure of procedure    There were no vitals filed for this visit.  Subjective Assessment - 09/20/17 1136    Subjective  Patient reports decreased pain overall and states she has been performing greater overhead motions. Patient states she had a significant drop in her blood sugar today but she had drank a high carbohydrate drink which increased it to 'a little over 100'.    Pertinent History  Chronic history of L shoulder pain     Limitations  Lifting    Patient Stated Goals  To improve shoulder function and decrease pain    Currently in Pain?  Yes    Pain Score  2     Pain Location  Shoulder    Pain Orientation  Right;Left    Pain Descriptors / Indicators  Aching    Pain Onset  More than a month ago    Pain Frequency  Intermittent          TREATMENT   Manual Therapy: Sidelying scapular mobilizations depression, downward/upward rotation,retraction - 3 x 20sec in each direction    Therapeutic  Exercise:   Sidelying Shoulder ER in sidelying - 2 x 20  Sidelying shoulder flexion with Therapist assistance - 2 x 20  UE ranger flexion in standing with ranger on floor -  x 10  UE ranger IR/ER in standing with ranger on floor -- x 10     Patient demonstrates increased fatigue at the end of the session      PT Education - 09/20/17 1310    Education Details  form/technique with exercise    Person(s) Educated  Patient    Methods  Explanation;Demonstration    Comprehension  Verbalized understanding;Returned demonstration          PT Long Term Goals - 09/14/17 1327      PT LONG TERM GOAL #1   Title  Patient will improve AROM to 120 degrees left shoulder with mild discomfort for reaching into cabinet or performing hair care     Baseline  95 AROM in sitting; 09/14/2017: 105 deg    Time  8    Period  Weeks    Status  On-going      PT LONG TERM GOAL #2   Title  Patient will improve all shoulder strength  4+/5 for flexion and rotation to improve ability to manipulate objects in space.     Baseline  3/5 - 3+/5 within all measured motions; 09/14/2017: 3+/5 with all motion    Time  8    Period  Weeks    Status  On-going      PT LONG TERM GOAL #3   Title  Patient will  be independent with home program for strength and pain control for left UE/shoulder by 04/17/2015    Baseline  limited knowledge of pain control strategies or progression of exercises for left UE; 09/14/2017: Requires moderate cueing for exercise progression    Time  8    Period  Weeks    Status  On-going            Plan - 09/20/17 1319    Clinical Impression Statement  Patient demonstrates improvement with strength with overhead movement today with improved ability to perform flexion with less pain and requiring less assistance from PT to perform. Patient continues to demonstrate decreased strength most notably with shoulder rotational movements. Patient will benefit from further skilled therapy to return to prior  level of function.     Rehab Potential  Fair    Clinical Impairments Affecting Rehab Potential  (+) highly motivated, (-) age, decreased strength    PT Frequency  2x / week    PT Duration  8 weeks    PT Treatment/Interventions  Passive range of motion;Manual techniques;Electrical Stimulation;Cryotherapy;Moist Heat;Ultrasound;Therapeutic exercise;Therapeutic activities;Neuromuscular re-education;Patient/family education;Scar mobilization;Dry needling;Iontophoresis 4mg /ml Dexamethasone    PT Next Visit Plan  progress AROM exercises    PT Home Exercise Plan  See education section    Consulted and Agree with Plan of Care  Patient       Patient will benefit from skilled therapeutic intervention in order to improve the following deficits and impairments:  Pain, Impaired sensation, Decreased coordination, Decreased mobility, Increased muscle spasms, Decreased activity tolerance, Decreased endurance, Decreased range of motion, Decreased strength  Visit Diagnosis: Chronic left shoulder pain  Stiffness of left shoulder, not elsewhere classified  Muscle weakness (generalized)     Problem List There are no active problems to display for this patient.   [name], PT DPT 09/20/2017, 1:26 PM  [place] [facility] PHYSICAL AND SPORTS MEDICINE [address] [place], [address] Phone: [phone]   Fax:  [phone]  Name: Suzanne Snyder MRN: [id number] Date of Birth: May 02, 1950

## 2017-09-23 ENCOUNTER — Ambulatory Visit: Payer: Medicare Other

## 2017-09-23 DIAGNOSIS — M25512 Pain in left shoulder: Principal | ICD-10-CM

## 2017-09-23 DIAGNOSIS — G8929 Other chronic pain: Secondary | ICD-10-CM

## 2017-09-23 DIAGNOSIS — M6281 Muscle weakness (generalized): Secondary | ICD-10-CM

## 2017-09-23 DIAGNOSIS — M25612 Stiffness of left shoulder, not elsewhere classified: Secondary | ICD-10-CM

## 2017-09-23 NOTE — Therapy (Signed)
[facility] [address] [place], [place], [address] Phone: [phone]   Fax:  [phone]  Physical Therapy Treatment  Patient Details  Name: Suzanne Snyder MRN: [id number] Date of Birth: August 08, 1950 Referring Provider: Olga MillersKamath MD   Encounter Date: 09/23/2017  PT End of Session - 09/23/17 1233    Visit Number  10    Number of Visits  26    Date for PT Re-Evaluation  10/25/17    Authorization Type  2/ [employer]    PT Start Time  1120    PT Stop Time  1200    PT Time Calculation (min)  40 min    Activity Tolerance  Patient tolerated treatment well    Behavior During Therapy  [facility] assessed/performed       Past Medical History:  Diagnosis Date  . Anxiety   . Diabetes mellitus without complication (HCC)   . Osteoporosis     Past Surgical History:  Procedure Laterality Date  . 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 ROTATOR CUFF REPAIR Left 2014 and 2015  . SPINE SURGERY N/A 1984   L4-5, not sure of procedure    There were no vitals filed for this visit.  Subjective Assessment - 09/23/17 1230    Subjective  Patient reports increased pain when laying down and states 'she couldnt get comfortable. Patient reports the shoulder is geting stronger.     Pertinent History  Chronic history of L shoulder pain     Limitations  Lifting    Patient Stated Goals  To improve shoulder function and decrease pain    Currently in Pain?  Yes    Pain Score  3     Pain Location  Shoulder    Pain Orientation  Right    Pain Descriptors / Indicators  Aching    Pain Type  Chronic pain    Pain Onset  More than a month ago    Pain Frequency  Intermittent       TREATMENT   Manual Therapy: Sidelying scapular mobilizations depression, downward/upward rotation,retraction - 3 x 20sec in each direction    Therapeutic Exercise:   Sidelying Shoulder ER in sidelying - 2 x 20  Sidelying shoulder flexion with  Therapist assistance - 2 x 20  UE ranger shoulder flexion in standing with ranger -  x 10  UE ranger IR/ER in standing with ranger on floor -- x 10 Scapular retraction rows - x20 with YTB Scapular depression in sidelying - x20     Patient demonstrates increased fatigue at the end of the session   PT Education - 09/23/17 1233    Education Details  form/technique with exercise    Person(s) Educated  Patient    Methods  Explanation;Demonstration    Comprehension  Verbalized understanding;Returned demonstration          PT Long Term Goals - 09/14/17 1327      PT LONG TERM GOAL #1   Title  Patient will improve AROM to 120 degrees left shoulder with mild discomfort for reaching into cabinet or performing hair care     Baseline  95 AROM in sitting; 09/14/2017: 105 deg    Time  8    Period  Weeks    Status  On-going      PT LONG TERM GOAL #2   Title  Patient will improve all shoulder strength 4+/5 for flexion and rotation to improve  ability to manipulate objects in space.     Baseline  3/5 - 3+/5 within all measured motions; 09/14/2017: 3+/5 with all motion    Time  8    Period  Weeks    Status  On-going      PT LONG TERM GOAL #3   Title  Patient will  be independent with home program for strength and pain control for left UE/shoulder by 04/17/2015    Baseline  limited knowledge of pain control strategies or progression of exercises for left UE; 09/14/2017: Requires moderate cueing for exercise progression    Time  8    Period  Weeks    Status  On-going            Plan - 09/23/17 1327    Clinical Impression Statement  Patient demonstrates improvement with shoulder flexion with less abberrant movements with overhead flexion indicating improvement with motor control and strength of the shoulder rotator musculature. Patient demonstrates significant weakness with performing shoulder ER and is unable to perform against resitance. Patient will benefit from further skilled therapy to  return to prior level of function.     Rehab Potential  Fair    Clinical Impairments Affecting Rehab Potential  (+) highly motivated, (-) age, decreased strength    PT Frequency  2x / week    PT Duration  8 weeks    PT Treatment/Interventions  Passive range of motion;Manual techniques;Electrical Stimulation;Cryotherapy;Moist Heat;Ultrasound;Therapeutic exercise;Therapeutic activities;Neuromuscular re-education;Patient/family education;Scar mobilization;Dry needling;Iontophoresis 4mg /ml Dexamethasone    PT Next Visit Plan  progress AROM exercises    PT Home Exercise Plan  See education section    Consulted and Agree with Plan of Care  Patient       Patient will benefit from skilled therapeutic intervention in order to improve the following deficits and impairments:  Pain, Impaired sensation, Decreased coordination, Decreased mobility, Increased muscle spasms, Decreased activity tolerance, Decreased endurance, Decreased range of motion, Decreased strength  Visit Diagnosis: Chronic left shoulder pain  Stiffness of left shoulder, not elsewhere classified  Muscle weakness (generalized)     Problem List There are no active problems to display for this patient.   Suzanne Snyder, PT DPT 09/23/2017, 1:33 PM  [place] [facility] PH[facility] AND SPORTS MEDICINE [address], [place], [address] Phone: [phone]   Fax:  [phone]  Name: Suzanne Snyder MRN: [id number] Date of Birth: 07-28-1950

## 2017-09-27 ENCOUNTER — Ambulatory Visit: Payer: Medicare Other

## 2017-09-27 DIAGNOSIS — M25612 Stiffness of left shoulder, not elsewhere classified: Secondary | ICD-10-CM | POA: Diagnosis not present

## 2017-09-27 DIAGNOSIS — M25512 Pain in left shoulder: Principal | ICD-10-CM

## 2017-09-27 DIAGNOSIS — G8929 Other chronic pain: Secondary | ICD-10-CM

## 2017-09-27 DIAGNOSIS — M6281 Muscle weakness (generalized): Secondary | ICD-10-CM

## 2017-09-27 NOTE — Therapy (Signed)
[place] [facility] [address] [place], [place], [address] Phone: [phone]   Fax:  [phone]  Physical Therapy Treatment  Patient Details  Name: Suzanne LeberDeborah L Bob MRN: [id number] Date of Birth: Nov 20, 1950 Referring Provider: Olga MillersKamath MD   Encounter Date: 09/27/2017  PT End of Session - 09/27/17 1550    Visit Number  11    Number of Visits  26    Date for PT Re-Evaluation  10/25/17    Authorization Type  3/ [employer]    PT Start Time  1530    PT Stop Time  1615    PT Time Calculation (min)  45 min    Activity Tolerance  Patient tolerated treatment well    Behavior During Therapy  [facility]       Past Medical History:  Diagnosis Date  . Anxiety   . Diabetes mellitus without complication (HCC)   . Osteoporosis     Past Surgical History:  Procedure Laterality Date  . 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 ROTATOR CUFF REPAIR Left 2014 and 2015  . SPINE SURGERY N/A 1984   L4-5, not sure of procedure    There were no vitals filed for this visit.  Subjective Assessment - 09/27/17 1541    Subjective  Patient reports decreased pain overall in the L shoulder but states she has difficult raising her arm overhead and needs to 'inch her arm' up the wall to reach items.     Pertinent History  Chronic history of L shoulder pain     Limitations  Lifting    Patient Stated Goals  To improve shoulder function and decrease pain    Currently in Pain?  No/denies    Pain Onset  More than a month ago         TREATMENT   Manual Therapy: Sidelying scapular mobilizations depression, downward/upward rotation,retraction - 3 x 20sec in each direction    Therapeutic Exercise:   UE ranger shoulder flexion in standing with ranger -  x 20   UE ranger IR/ER in standing with ranger on floor -- x 10 2.5 # on the R UE Standing shoulder ER with elbow at side - x 20 with holding 1kg Standing shoulder  overhead taps - x5 performed on L for improving power Scapular retraction [facility] - [facility] Sidelying Shoulder ER in sidelying - x10 with 5 sec hold     Patient demonstrates increased fatigue at the end of the session   PT Education - 09/27/17 1546    Education Details  form/technique with exercise    Person(s) Educated  Patient    Methods  Explanation;Demonstration    Comprehension  Verbalized understanding;Returned demonstration          PT Long Term Goals - 09/14/17 1327      PT LONG TERM GOAL #1   Title  Patient will improve AROM to 120 degrees left shoulder with mild discomfort for reaching into cabinet or performing hair care     Baseline  95 AROM in sitting; 09/14/2017: 105 deg    Time  8    Period  Weeks    Status  On-going      PT LONG TERM GOAL #2   Title  Patient will improve all shoulder strength 4+/5 for flexion and rotation to improve ability to manipulate objects in space.     Baseline  3/5 - 3+/5 within all  measured motions; 09/14/2017: 3+/5 with all motion    Time  8    Period  Weeks    Status  On-going      PT LONG TERM GOAL #3   Title  Patient will  be independent with home program for strength and pain control for left UE/shoulder by 04/17/2015    Baseline  limited knowledge of pain control strategies or progression of exercises for left UE; 09/14/2017: Requires moderate cueing for exercise progression    Time  8    Period  Weeks    Status  On-going            Plan - 09/27/17 1605    Clinical Impression Statement  Patient demonstrates improvement in exercise performance with ability to perform gretaer amount of exercises in weight bearing positions compared to previous session indictating functional carryover between previous sessions. Patient was able to perform greater amonut of shoulder flexion and ER exercises. Patient will benefit form further skilled therapy to return to prior level fo function.      Rehab Potential  Fair    Clinical  Impairments Affecting Rehab Potential  (+) highly motivated, (-) age, decreased strength    PT Frequency  2x / week    PT Duration  8 weeks    PT Treatment/Interventions  Passive range of motion;Manual techniques;Electrical Stimulation;Cryotherapy;Moist Heat;Ultrasound;Therapeutic exercise;Therapeutic activities;Neuromuscular re-education;Patient/family education;Scar mobilization;Dry needling;Iontophoresis 4mg /ml Dexamethasone    PT Next Visit Plan  progress AROM exercises    PT Home Exercise Plan  See education section    Consulted and Agree with Plan of Care  Patient       Patient will benefit from skilled therapeutic intervention in order to improve the following deficits and impairments:  Pain, Impaired sensation, Decreased coordination, Decreased mobility, Increased muscle spasms, Decreased activity tolerance, Decreased endurance, Decreased range of motion, Decreased strength  Visit Diagnosis: Chronic left shoulder pain  Stiffness of left shoulder, not elsewhere classified  Muscle weakness (generalized)     Problem List There are no active problems to display for this patient.   [name], PT DPT 09/27/2017, 4:18 PM  [facility] PHYSICAL AND SPORTS MEDICINE [address] [place], [address] Phone: [phone]   Fax:  [phone]  Name: JISELE PRICE MRN: [id number] Date of Birth: 1950-11-23

## 2017-09-28 ENCOUNTER — Ambulatory Visit: Payer: Medicare Other

## 2017-09-28 DIAGNOSIS — M25612 Stiffness of left shoulder, not elsewhere classified: Secondary | ICD-10-CM | POA: Diagnosis not present

## 2017-09-28 DIAGNOSIS — M25512 Pain in left shoulder: Principal | ICD-10-CM

## 2017-09-28 DIAGNOSIS — M6281 Muscle weakness (generalized): Secondary | ICD-10-CM

## 2017-09-28 DIAGNOSIS — G8929 Other chronic pain: Secondary | ICD-10-CM

## 2017-09-28 NOTE — Therapy (Signed)
[place] [facility]TS MEDICINE [address], [place], [address] Phone: [phone]   Fax:  [phone]  Physical Therapy Treatment  Patient Details  Name: Suzanne Snyder MRN: [id number] Date of Birth: [birth year] Referring Provider: Olga Millers MD   Encounter Date: 09/28/2017  PT End of Session - 09/28/17 1425    Visit Number  12    Number of Visits  26    Date for PT Re-Evaluation  10/25/17    Authorization Type  4/ [employer]    PT Start Time  1345    PT Stop Time  1430    PT Time Calculation (min)  45 min    Activity Tolerance  Patient tolerated treatment well    Behavior During Therapy  [facility] for tasks assessed/performed       Past Medical History:  Diagnosis Date  . Anxiety   . Diabetes mellitus without complication (HCC)   . Osteoporosis     Past Surgical History:  Procedure Laterality Date  . 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 ROTATOR CUFF REPAIR Left 2014 and 2015  . SPINE SURGERY N/A 1984   L4-5, not sure of procedure    There were no vitals filed for this visit.  Subjective Assessment - 09/28/17 1423    Subjective  Patient reports decreased pain but states her blood sugar is feeling low and needs to drink juice to raise her blood sugar.     Pertinent History  Chronic history of L shoulder pain     Limitations  Lifting    Patient Stated Goals  To improve shoulder function and decrease pain    Currently in Pain?  No/denies    Pain Onset  More than a month ago        TREATMENT   Manual Therapy: Sidelying scapular mobilizations depression, downward/upward rotation,retraction - 6 x 60sec in each direction  UT stretch in sidelying with 10 sec holds x 15   Therapeutic Exercise:   UE ranger shoulder flexion in standing with ranger -  x 20 B Shoulder flexion with hands on the physioball - 2x20 B     Patient demonstrates increased fatigue at the end of the session   PT Education - 09/28/17 1425     Education Details  form/technique with exercise    Person(s) Educated  Patient    Methods  Explanation;Demonstration    Comprehension  Verbalized understanding;Returned demonstration          PT Long Term Goals - 09/14/17 1327      PT LONG TERM GOAL #1   Title  Patient will improve AROM to 120 degrees left shoulder with mild discomfort for reaching into cabinet or performing hair care     Baseline  95 AROM in sitting; 09/14/2017: 105 deg    Time  8    Period  Weeks    Status  On-going      PT LONG TERM GOAL #2   Title  Patient will improve all shoulder strength 4+/5 for flexion and rotation to improve ability to manipulate objects in space.     Baseline  3/5 - 3+/5 within all measured motions; 09/14/2017: 3+/5 with all motion    Time  8    Period  Weeks    Status  On-going      PT LONG TERM GOAL #3   Title  Patient will  be independent with home program for strength and pain control  for left UE/shoulder by 04/17/2015    Baseline  limited knowledge of pain control strategies or progression of exercises for left UE; 09/14/2017: Requires moderate cueing for exercise progression    Time  8    Period  Weeks    Status  On-going            Plan - 09/28/17 1430    Clinical Impression Statement  Patient demonstrates increased difficulty with reaching overhead and improving shoulder flexion to allow for reaching for items on a high shelf. Patient demosntrates increased serratus weakness and focused on performing shoulder protraction. Performed shortened session today secondary to blood sugar difficulties. Patient will benefit from further skilled therapy to return to prior level of function.     Rehab Potential  Fair    Clinical Impairments Affecting Rehab Potential  (+) highly motivated, (-) age, decreased strength    PT Frequency  2x / week    PT Duration  8 weeks    PT Treatment/Interventions  Passive range of motion;Manual techniques;Electrical Stimulation;Cryotherapy;Moist  Heat;Ultrasound;Therapeutic exercise;Therapeutic activities;Neuromuscular re-education;Patient/family education;Scar mobilization;Dry needling;Iontophoresis 4mg /ml Dexamethasone    PT Next Visit Plan  progress AROM exercises    PT Home Exercise Plan  See education section    Consulted and Agree with Plan of Care  Patient       Patient will benefit from skilled therapeutic intervention in order to improve the following deficits and impairments:  Pain, Impaired sensation, Decreased coordination, Decreased mobility, Increased muscle spasms, Decreased activity tolerance, Decreased endurance, Decreased range of motion, Decreased strength  Visit Diagnosis: Chronic left shoulder pain  Stiffness of left shoulder, not elsewhere classified  Muscle weakness (generalized)     Problem List There are no active problems to display for this patient.   Myrene GalasWesley Khayri Kargbo, PT DPT 09/28/2017, 2:43 PM  [place] [facility] [address] [place], [place], [address] Phone: [phone]   Fax:  [phone]  Name: Henreitta LeberDeborah L Mccabe MRN: [id number] Date of Birth: Mar 12, 1950

## 2017-10-04 ENCOUNTER — Ambulatory Visit: Payer: Medicare Other

## 2017-10-04 DIAGNOSIS — M25612 Stiffness of left shoulder, not elsewhere classified: Secondary | ICD-10-CM | POA: Diagnosis not present

## 2017-10-04 DIAGNOSIS — M25512 Pain in left shoulder: Principal | ICD-10-CM

## 2017-10-04 DIAGNOSIS — G8929 Other chronic pain: Secondary | ICD-10-CM

## 2017-10-04 DIAGNOSIS — M6281 Muscle weakness (generalized): Secondary | ICD-10-CM

## 2017-10-04 NOTE — Therapy (Signed)
[facility] PHYSICAL AND SPORTS MEDICINE [address] [place], [place], [address] Phone: [phone]   Fax:  [phone]  Physical Therapy Treatment  Patient Details  Name: Suzanne Snyder MRN: [id number] Date of Birth: 1950-08-06 Referring Provider: Olga MillersKamath MD   Encounter Date: 10/04/2017  PT End of Session - 10/04/17 1821    Visit Number  13    Number of Visits  26    Date for PT Re-Evaluation  10/25/17    Authorization Type  5/ [employer]    PT Start Time  1800    PT Stop Time  1845    PT Time Calculation (min)  45 min    Activity Tolerance  Patient tolerated treatment well    Behavior During Therapy  [facility] tasks [facility]/performed       Past Medical History:  Diagnosis Date  . Anxiety   . Diabetes mellitus without complication (HCC)   . Osteoporosis     Past Surgical History:  Procedure Laterality Date  . 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 ROTATOR CUFF REPAIR Left 2014 and 2015  . SPINE SURGERY N/A 1984   L4-5, not sure of procedure    There were no vitals filed for this visit.  Subjective Assessment - 10/04/17 1807    Subjective  Patient reports increased pain at rest along B shoulders. Patient states she has difficulty with performing press ups to move her bottom and shift her weight to the side.     Pertinent History  Chronic history of L shoulder pain     Limitations  Lifting    Patient Stated Goals  To improve shoulder function and decrease pain    Currently in Pain?  Yes    Pain Score  3     Pain Location  Shoulder    Pain Orientation  Right;Left    Pain Descriptors / Indicators  Aching    Pain Type  Chronic pain    Pain Onset  More than a month ago         TREATMENT   Manual Therapy: Sidelying scapular mobilizations depression, downward/upward rotation,retraction to decrease increased pain and muscular spasms x 20min   Therapeutic Exercise:   [facility]ap[facility] Standing  shoulder ER - x 20 with 2# weights  Standing elbow extension at OMEGA 10# -- x 25 Seated high row at OME[employer] 10# --x25  UE ranger shoulder flexion in standing with ranger -  x 20 2# Shoulder flexion with physioball - x 20     Patient demonstrates increased fatigue at the end of the session    PT Education - 10/04/17 1809    Education Details  form/technique with exercise    Person(s) Educated  Patient    Methods  Explanation;Demonstration    Comprehension  Verbalized understanding;Returned demonstration          PT Long Term Goals - 09/14/17 1327      PT LONG TERM GOAL #1   Title  Patient will improve AROM to 120 degrees left shoulder with mild discomfort for reaching into cabinet or performing hair care     Baseline  95 AROM in sitting; 09/14/2017: 105 deg    Time  8    Period  Weeks    Status  On-going      PT LONG TERM GOAL #2   Title  Patient will improve all shoulder strength 4+/5 for flexion and rotation  to improve ability to manipulate objects in space.     Baseline  3/5 - 3+/5 within all measured motions; 09/14/2017: 3+/5 with all motion    Time  8    Period  Weeks    Status  On-going      PT LONG TERM GOAL #3   Title  Patient will  be independent with home program for strength and pain control for left UE/shoulder by 04/17/2015    Baseline  limited knowledge of pain control strategies or progression of exercises for left UE; 09/14/2017: Requires moderate cueing for exercise progression    Time  8    Period  Weeks    Status  On-going            Plan - 10/04/17 1833    Clinical Impression Statement  Continued to address increased shoulder weakness with overhead motions and ER. Patient demonstrates singificant decrease in strength requires external devices to assist with raising her arm overhead. Performed all exercises B to address decrease in strength. Patient will benefit from further skilled therapy to return to prior level of function.      Rehab Potential  Fair     Clinical Impairments Affecting Rehab Potential  (+) highly motivated, (-) age, decreased strength    PT Frequency  2x / week    PT Duration  8 weeks    PT Treatment/Interventions  Passive range of motion;Manual techniques;Electrical Stimulation;Cryotherapy;Moist Heat;Ultrasound;Therapeutic exercise;Therapeutic activities;Neuromuscular re-education;Patient/family education;Scar mobilization;Dry needling;Iontophoresis 4mg /ml Dexamethasone    PT Next Visit Plan  progress AROM exercises    PT Home Exercise Plan  See education section    Consulted and Agree with Plan of Care  Patient       Patient will benefit from skilled therapeutic intervention in order to improve the following deficits and impairments:  Pain, Impaired sensation, Decreased coordination, Decreased mobility, Increased muscle spasms, Decreased activity tolerance, Decreased endurance, Decreased range of motion, Decreased strength  Visit Diagnosis: Chronic left shoulder pain  Stiffness of left shoulder, not elsewhere classified  Muscle weakness (generalized)     Problem List There are no active problems to display for this patient.   [name], PT DPT 10/04/2017, 6:44 PM  [facility] PHYSICAL AND SPORTS MEDICINE [address], [place], [address] Phone: [phone]   Fax:  [phone]  Name: Suzanne Snyder MRN: [id number] Date of Birth: 1950-12-19

## 2017-10-11 ENCOUNTER — Ambulatory Visit: Payer: Medicare Other

## 2017-10-11 DIAGNOSIS — M25612 Stiffness of left shoulder, not elsewhere classified: Secondary | ICD-10-CM | POA: Diagnosis not present

## 2017-10-11 DIAGNOSIS — G8929 Other chronic pain: Secondary | ICD-10-CM

## 2017-10-11 DIAGNOSIS — M25512 Pain in left shoulder: Principal | ICD-10-CM

## 2017-10-11 DIAGNOSIS — M6281 Muscle weakness (generalized): Secondary | ICD-10-CM

## 2017-10-12 NOTE — Therapy (Signed)
[place] [facility] PH[facility]TS [facility]ICINE [address], [place], [address] Phone: [phone]   Fax:  [phone]  Physical Therapy Treatment  Patient Details  Name: Suzanne Snyder MRN: [id number] Date of Birth: 1950/02/15 Referring Provider (PT): Olga Millers MD   Encounter Date: 10/11/2017  PT End of Session - 10/12/17 0817    Visit Number  14    Number of Visits  26    Date for PT Re-Evaluation  10/25/17    Authorization Type  6/ [employer]    PT Start Time  1845    PT Stop Time  1930    PT Time Calculation (min)  45 min    Activity Tolerance  Patient tolerated treatment well    Behavior During Therapy  [facility] for tasks assessed/performed       Past Medical History:  Diagnosis Date  . Anxiety   . Diabetes mellitus without complication (HCC)   . Osteoporosis     Past Surgical History:  Procedure Laterality Date  . 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 ROTATOR CUFF REPAIR Left 2014 and 2015  . SPINE SURGERY N/A 1984   L4-5, not sure of procedure    There were no vitals filed for this visit.  Subjective Assessment - 10/12/17 0816    Subjective  Patient states her shoulder has been in increased pain the past 3 days and states the paiin is worse at night compared to previous sessions.     Pertinent History  Chronic history of L shoulder pain     Limitations  Lifting    Patient Stated Goals  To improve shoulder function and decrease pain    Currently in Pain?  Yes    Pain Score  4     Pain Location  Shoulder    Pain Orientation  Right;Left    Pain Descriptors / Indicators  Aching    Pain Type  Chronic pain    Pain Onset  More than a month ago    Pain Frequency  Intermittent         TREATMENT   Manual Therapy: Sidelying scapular mobilizations depression, downward/upward rotation,retraction to decrease increased pain and muscular spasms; STM to infrapsinatus and middle/upper trap x   Therapeutic Exercise:    Manually resisted scapular depression - x 30  Manually resisted scapular retraction - x 30 Supine scapular retraction - x 20  UE ranger shoulder flexion in standing with ranger - x 20  Standing shoulder ER with YTB - x 20 with therapist assistance  Arm circles at UE ranger - x 20    Patient demonstrates increased fatigue at the end of the session    PT Education - 10/12/17 0817    Education Details  form/technique with exercise    Person(s) Educated  Patient    Methods  Demonstration;Explanation    Comprehension  Verbalized understanding;Returned demonstration          PT Long Term Goals - 09/14/17 1327      PT LONG TERM GOAL #1   Title  Patient will improve AROM to 120 degrees left shoulder with mild discomfort for reaching into cabinet or performing hair care     Baseline  95 AROM in sitting; 09/14/2017: 105 deg    Time  8    Period  Weeks    Status  On-going      PT LONG TERM GOAL #2   Title  Patient will improve  all shoulder strength 4+/5 for flexion and rotation to improve ability to manipulate objects in space.     Baseline  3/5 - 3+/5 within all measured motions; 09/14/2017: 3+/5 with all motion    Time  8    Period  Weeks    Status  On-going      PT LONG TERM GOAL #3   Title  Patient will  be independent with home program for strength and pain control for left UE/shoulder by 04/17/2015    Baseline  limited knowledge of pain control strategies or progression of exercises for left UE; 09/14/2017: Requires moderate cueing for exercise progression    Time  8    Period  Weeks    Status  On-going            Plan - 10/12/17 0817    Clinical Impression Statement  Patient demonstrates improvement with overhead shoulder flexion with less abberrant movement compared to previous sessions indicating functional carryover between sessions. Patient demonstrates decreased pain after performing scapular mobilizations indicating poor scapular control and patient will benefit from  further skilled therapy to return to prior level of function.     Rehab Potential  Fair    Clinical Impairments Affecting Rehab Potential  (+) highly motivated, (-) age, decreased strength    PT Frequency  2x / week    PT Duration  8 weeks    PT Treatment/Interventions  Passive range of motion;Manual techniques;Electrical Stimulation;Cryotherapy;Moist Heat;Ultrasound;Therapeutic exercise;Therapeutic activities;Neuromuscular re-education;Patient/family education;Scar mobilization;Dry needling;Iontophoresis 4mg /ml Dexamethasone    PT Next Visit Plan  progress AROM exercises    PT Home Exercise Plan  See education section    Consulted and Agree with Plan of Care  Patient       Patient will benefit from skilled therapeutic intervention in order to improve the following deficits and impairments:  Pain, Impaired sensation, Decreased coordination, Decreased mobility, Increased muscle spasms, Decreased activity tolerance, Decreased endurance, Decreased range of motion, Decreased strength  Visit Diagnosis: Chronic left shoulder pain  Stiffness of left shoulder, not elsewhere classified  Muscle weakness (generalized)     Problem List There are no active problems to display for this patient.   [name], PT DPT 10/12/2017, 8:20 AM  [facility] PHYSICAL AND SPORTS MEDICINE [address], [place], [address] Phone: [phone]   Fax:  [phone]  Name: Suzanne Snyder MRN: [id number] Date of Birth: Feb 23, 1950

## 2017-10-14 ENCOUNTER — Ambulatory Visit: Payer: Medicare Other

## 2017-10-18 ENCOUNTER — Ambulatory Visit: Payer: Medicare Other | Attending: Orthopedic Surgery

## 2017-10-18 DIAGNOSIS — M6281 Muscle weakness (generalized): Secondary | ICD-10-CM | POA: Diagnosis present

## 2017-10-18 DIAGNOSIS — G8929 Other chronic pain: Secondary | ICD-10-CM | POA: Insufficient documentation

## 2017-10-18 DIAGNOSIS — M545 Low back pain: Secondary | ICD-10-CM | POA: Diagnosis present

## 2017-10-18 DIAGNOSIS — M25512 Pain in left shoulder: Secondary | ICD-10-CM | POA: Insufficient documentation

## 2017-10-18 DIAGNOSIS — M25612 Stiffness of left shoulder, not elsewhere classified: Secondary | ICD-10-CM | POA: Insufficient documentation

## 2017-10-18 NOTE — Therapy (Signed)
[facility] [place] [facility] PHYSICAL [facility] SPORTS MEDICINE [address] [place], [address] Phone: [phone]   Fax:  [phone]  Physical Therapy Treatment  Patient Details  Name: Suzanne Snyder MRN: [id number] Date of Birth: 04-22-1950 Referring Provider (PT): Olga Millers MD   Encounter Date: 10/18/2017  PT End of Session - 10/18/17 1215    Visit Number  15    Number of Visits  26    Date for PT Re-Evaluation  10/25/17    Authorization Type  7/ [employer]    PT Start Time  1120    PT Stop Time  1200    PT Time Calculation (min)  40 min    Activity Tolerance  Patient tolerated treatment well    Behavior During Therapy  [facility] for tasks assessed/performed       Past Medical History:  Diagnosis Date  . Anxiety   . Diabetes mellitus without complication (HCC)   . Osteoporosis     Past Surgical History:  Procedure Laterality Date  . 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 ROTATOR CUFF REPAIR Left 2014 and 2015  . SPINE SURGERY N/A 1984   L4-5, not sure of procedure    There were no vitals filed for this visit.  Subjective Assessment - 10/18/17 1213    Subjective  Patient reports she moved clothes the past weekened which the patient reports increased muscular soreness along her UE.     Pertinent History  Chronic history of L shoulder pain     Limitations  Lifting    Patient Stated Goals  To improve shoulder function and decrease pain    Currently in Pain?  Yes    Pain Score  2     Pain Location  Shoulder    Pain Orientation  Left;Right    Pain Descriptors / Indicators  Aching    Pain Type  Chronic pain    Pain Onset  More than a month ago       TREATMENT     Therapeutic Exercise:   Standing scapular retraction rows - x 20  Straight arm push downs - x 20  Standing shoulder extension with YTB - x 20  Sitting shoulder pulley AAROM - x 20  Standing shoulder pulley AAROM - x 20  UE ranger shoulder flexion in standing with UE ranger -  x 20  Standing shoulder ER with YTB - x 20 with therapist assistance  Arm circles at UE ranger - x 20 cw/ccw     Patient demonstrates increased fatigue at the end of the session  PT Education - 10/18/17 1214    Education Details  form/technique with exercise    Person(s) Educated  Patient    Methods  Explanation;Demonstration    Comprehension  Verbalized understanding;Returned demonstration          PT Long Term Goals - 09/14/17 1327      PT LONG TERM GOAL #1   Title  Patient will improve AROM to 120 degrees left shoulder with mild discomfort for reaching into cabinet or performing hair care     Baseline  95 AROM in sitting; 09/14/2017: 105 deg    Time  8    Period  Weeks    Status  On-going      PT LONG TERM GOAL #2   Title  Patient will improve all shoulder strength 4+/5 for flexion and rotation to improve ability to manipulate objects in space.  Baseline  3/5 - 3+/5 within all measured motions; 09/14/2017: 3+/5 with all motion    Time  8    Period  Weeks    Status  On-going      PT LONG TERM GOAL #3   Title  Patient will  be independent with home program for strength and pain control for left UE/shoulder by 04/17/2015    Baseline  limited knowledge of pain control strategies or progression of exercises for left UE; 09/14/2017: Requires moderate cueing for exercise progression    Time  8    Period  Weeks    Status  On-going            Plan - 10/18/17 1215    Clinical Impression Statement  Performed greater amount of exercises today since patient had decreased pain compared to previous visits and decrease in shoulder strength is one of the primary barriers to discharge. Patient demonstrates improvement with ability to perform greater amount of exercises compared to previous sessions. Patient continues to demonstrates decreased shoulder flexion and ER strength and will benefit from further skilled therapy to return to prior level of function.     Rehab Potential  Fair     Clinical Impairments Affecting Rehab Potential  (+) highly motivated, (-) age, decreased strength    PT Frequency  2x / week    PT Duration  8 weeks    PT Treatment/Interventions  Passive range of motion;Manual techniques;Electrical Stimulation;Cryotherapy;Moist Heat;Ultrasound;Therapeutic exercise;Therapeutic activities;Neuromuscular re-education;Patient/family education;Scar mobilization;Dry needling;Iontophoresis 4mg /ml Dexamethasone    PT Next Visit Plan  progress AROM exercises    PT Home Exercise Plan  See education section    Consulted and Agree with Plan of Care  Patient       Patient will benefit from skilled therapeutic intervention in order to improve the following deficits and impairments:  Pain, Impaired sensation, Decreased coordination, Decreased mobility, Increased muscle spasms, Decreased activity tolerance, Decreased endurance, Decreased range of motion, Decreased strength  Visit Diagnosis: Chronic left shoulder pain  Stiffness of left shoulder, not elsewhere classified  Muscle weakness (generalized)     Problem List There are no active problems to display for this patient.   [name], PT DPT 10/18/2017, 12:28 PM   [facility] PHYSICAL AND SPORTS MEDICINE [address], [place], [address] Phone: [phone]   Fax:  [phone]  Name: Suzanne Snyder MRN: [id number] Date of Birth: 09/17/1950

## 2017-10-21 ENCOUNTER — Ambulatory Visit: Payer: Medicare Other

## 2017-10-21 DIAGNOSIS — M6281 Muscle weakness (generalized): Secondary | ICD-10-CM

## 2017-10-21 DIAGNOSIS — G8929 Other chronic pain: Secondary | ICD-10-CM

## 2017-10-21 DIAGNOSIS — M25512 Pain in left shoulder: Secondary | ICD-10-CM | POA: Diagnosis not present

## 2017-10-21 DIAGNOSIS — M25612 Stiffness of left shoulder, not elsewhere classified: Secondary | ICD-10-CM

## 2017-10-21 NOTE — Therapy (Signed)
[facility] PHYSICAL [facility] SPORTS MEDICINE [address], [place], [address] Phone: [phone]   Fax:  [phone]  Physical Therapy Treatment  Patient Details  Name: Suzanne Snyder MRN: [id number] Date of Birth: August 24, 1950 Referring Provider (PT): Olga Millers MD   Encounter Date: 10/21/2017  PT End of Session - 10/21/17 1307    Visit Number  16    Number of Visits  26    Date for PT Re-Evaluation  10/25/17    Authorization Type  8/ [employer]    PT Start Time  1120    PT Stop Time  1200    PT Time Calculation (min)  40 min    Activity Tolerance  Patient tolerated treatment well    Behavior During Therapy  [facility] for tasks assessed/performed       Past Medical History:  Diagnosis Date  . Anxiety   . Diabetes mellitus without complication (HCC)   . Osteoporosis     Past Surgical History:  Procedure Laterality Date  . 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 ROTATOR CUFF REPAIR Left 2014 and 2015  . SPINE SURGERY N/A 1984   L4-5, not sure of procedure    There were no vitals filed for this visit.  Subjective Assessment - 10/21/17 1300    Subjective  Patient reports she did not sleep last night secondary to conditions with her family. Patient states improvement with shoulder mobility but continues to have difficulty with lifting overhead.     Pertinent History  Chronic history of L shoulder pain     Limitations  Lifting    Patient Stated Goals  To improve shoulder function and decrease pain    Currently in Pain?  Yes    Pain Score  2     Pain Location  Shoulder    Pain Orientation  Left;Right    Pain Descriptors / Indicators  Aching    Pain Type  Chronic pain    Pain Onset  More than a month ago    Pain Frequency  Intermittent        TREATMENT Therapeutic Exercise:   Standing scapular retraction rows - x 20 7# UE ranger shoulder flexion in standing with UE ranger - x 20  UE ranger shoulder ER/IR - x 20  Straight arm  push downs - x 20 12# High Row in sitting - x 20 15# Sitting shoulder flexion pulley AAROM - x 20 B ER shoulder - x20  Patient reports increased fatigue at the end of the session   PT Education - 10/21/17 1307    Education Details  form/technique with exercise    Person(s) Educated  Patient    Methods  Explanation;Demonstration    Comprehension  Verbalized understanding;Returned demonstration          PT Long Term Goals - 09/14/17 1327      PT LONG TERM GOAL #1   Title  Patient will improve AROM to 120 degrees left shoulder with mild discomfort for reaching into cabinet or performing hair care     Baseline  95 AROM in sitting; 09/14/2017: 105 deg    Time  8    Period  Weeks    Status  On-going      PT LONG TERM GOAL #2   Title  Patient will improve all shoulder strength 4+/5 for flexion and rotation to improve ability to manipulate objects in space.     Baseline  3/5 -  3+/5 within all measured motions; 09/14/2017: 3+/5 with all motion    Time  8    Period  Weeks    Status  On-going      PT LONG TERM GOAL #3   Title  Patient will  be independent with home program for strength and pain control for left UE/shoulder by 04/17/2015    Baseline  limited knowledge of pain control strategies or progression of exercises for left UE; 09/14/2017: Requires moderate cueing for exercise progression    Time  8    Period  Weeks    Status  On-going            Plan - 10/21/17 1308    Clinical Impression Statement  Continued to perform overhead activities however, she demonstrates poor strength with shoulder flexion and ER with inability to perform without therapist assistance. Patient demosntrates less abberrant movement with shoulder exercises and will benefit from further skilled therapy to return to prior level of function.     Rehab Potential  Fair    Clinical Impairments Affecting Rehab Potential  (+) highly motivated, (-) age, decreased strength    PT Frequency  2x / week    PT  Duration  8 weeks    PT Treatment/Interventions  Passive range of motion;Manual techniques;Electrical Stimulation;Cryotherapy;Moist Heat;Ultrasound;Therapeutic exercise;Therapeutic activities;Neuromuscular re-education;Patient/family education;Scar mobilization;Dry needling;Iontophoresis 4mg /ml Dexamethasone    PT Next Visit Plan  progress AROM exercises    PT Home Exercise Plan  See education section    Consulted and Agree with Plan of Care  Patient       Patient will benefit from skilled therapeutic intervention in order to improve the following deficits and impairments:  Pain, Impaired sensation, Decreased coordination, Decreased mobility, Increased muscle spasms, Decreased activity tolerance, Decreased endurance, Decreased range of motion, Decreased strength  Visit Diagnosis: Chronic left shoulder pain  Stiffness of left shoulder, not elsewhere classified  Muscle weakness (generalized)     Problem List There are no active problems to display for this patient.  Burman Foster, SPT  [name], PT DPT 10/21/2017, 1:14 PM  [facility] PHYSICAL AND SPORTS MEDICINE [address] [place], [address] Phone: [phone]   Fax:  [phone]  Name: Suzanne Snyder MRN: [id number] Date of Birth: 1950-03-09

## 2017-10-25 ENCOUNTER — Ambulatory Visit: Payer: Medicare Other

## 2017-10-25 DIAGNOSIS — M6281 Muscle weakness (generalized): Secondary | ICD-10-CM

## 2017-10-25 DIAGNOSIS — M25512 Pain in left shoulder: Principal | ICD-10-CM

## 2017-10-25 DIAGNOSIS — G8929 Other chronic pain: Secondary | ICD-10-CM

## 2017-10-25 DIAGNOSIS — M25612 Stiffness of left shoulder, not elsewhere classified: Secondary | ICD-10-CM

## 2017-10-25 NOTE — Therapy (Signed)
[place] [facility]ICINE [address] [place], [address] Phone: [phone]   Fax:  [phone]  Physical Therapy Treatment  Patient Details  Name: Suzanne Snyder MRN: [id number] Date of Birth: 1950-04-14 Referring Provider (PT): Olga Millers MD   Encounter Date: 10/25/2017  PT End of Session - 10/25/17 1838    Visit Number  17    Number of Visits  26    Date for PT Re-Evaluation  10/25/17    Authorization Type  9/ 10 [employer]    PT Start Time  1615    PT Stop Time  1700    PT Time Calculation (min)  45 min    Activity Tolerance  Patient tolerated treatment well    Behavior During Therapy  [facility] for tasks assessed/performed       Past Medical History:  Diagnosis Date  . Anxiety   . Diabetes mellitus without complication (HCC)   . Osteoporosis     Past Surgical History:  Procedure Laterality Date  . 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 ROTATOR CUFF REPAIR Left 2014 and 2015  . SPINE SURGERY N/A 1984   L4-5, not sure of procedure    There were no vitals filed for this visit.  Subjective Assessment - 10/25/17 1831    Subjective  Patient reports increased pain after the previous session along her upper back. Patient states the pain has improved overall since the onset but continues to bother her slightly.     Pertinent History  Chronic history of L shoulder pain     Limitations  Lifting    Patient Stated Goals  To improve shoulder function and decrease pain    Currently in Pain?  Yes    Pain Score  4     Pain Location  Shoulder   upper back   Pain Orientation  Left;Right    Pain Descriptors / Indicators  Aching    Pain Type  Chronic pain    Pain Onset  More than a month ago    Pain Frequency  Intermittent        TREATMENT   Manual Therapy: Sidelying scapular mobilizations depression, downward/upward rotation,retraction to decrease increased pain and muscular spasms -- x  Therapeutic  Exercise:   Shoulder flexion UE ranger - x 20  AAROM with therapist support - in standing - x 20 Scapular depressions in sidelying - x 20 with 2 sec holds Shoulder ER in sidelying  -- x 20  Scapular retraction in sidelying - x20 against therapist support  Shoulder flexion in sidelying with overhead flexion   Patient reports increased fatigue at the end of the session   PT Education - 10/25/17 1837    Education Details  form/technique with exercise    Person(s) Educated  Patient    Methods  Explanation;Demonstration    Comprehension  Verbalized understanding;Returned demonstration          PT Long Term Goals - 09/14/17 1327      PT LONG TERM GOAL #1   Title  Patient will improve AROM to 120 degrees left shoulder with mild discomfort for reaching into cabinet or performing hair care     Baseline  95 AROM in sitting; 09/14/2017: 105 deg    Time  8    Period  Weeks    Status  On-going      PT LONG TERM GOAL #2   Title  Patient will improve all shoulder  strength 4+/5 for flexion and rotation to improve ability to manipulate objects in space.     Baseline  3/5 - 3+/5 within all measured motions; 09/14/2017: 3+/5 with all motion    Time  8    Period  Weeks    Status  On-going      PT LONG TERM GOAL #3   Title  Patient will  be independent with home program for strength and pain control for left UE/shoulder by 04/17/2015    Baseline  limited knowledge of pain control strategies or progression of exercises for left UE; 09/14/2017: Requires moderate cueing for exercise progression    Time  8    Period  Weeks    Status  On-going            Plan - 10/25/17 1843    Clinical Impression Statement  Patient demonstrated increased fatigue and pain after the previous visit, thus performed greater manual therapy and weight bearing exercises. Patient continues to requires minA for abberrant movements with performing shoulder flexion with  UE ranger indicating poor motor control of external  rotators. Patient will benefit from further skilled therapy to return to prior level of function.     Rehab Potential  Fair    Clinical Impairments Affecting Rehab Potential  (+) highly motivated, (-) age, decreased strength    PT Frequency  2x / week    PT Duration  8 weeks    PT Treatment/Interventions  Passive range of motion;Manual techniques;Electrical Stimulation;Cryotherapy;Moist Heat;Ultrasound;Therapeutic exercise;Therapeutic activities;Neuromuscular re-education;Patient/family education;Scar mobilization;Dry needling;Iontophoresis 4mg /ml Dexamethasone    PT Next Visit Plan  progress AROM exercises    PT Home Exercise Plan  See education section    Consulted and Agree with Plan of Care  Patient       Patient will benefit from skilled therapeutic intervention in order to improve the following deficits and impairments:  Pain, Impaired sensation, Decreased coordination, Decreased mobility, Increased muscle spasms, Decreased activity tolerance, Decreased endurance, Decreased range of motion, Decreased strength  Visit Diagnosis: Chronic left shoulder pain  Stiffness of left shoulder, not elsewhere classified  Muscle weakness (generalized)     Problem List There are no active problems to display for this patient.   [name], PT DPT 10/25/2017, 6:56 PM  [place] [facility] PHYSICAL AND SPORTS MEDICINE [address] [place], [address] Phone: [phone]   Fax:  [phone]  Name: Suzanne Snyder MRN: [id number] Date of Birth: [birth year]

## 2017-10-28 ENCOUNTER — Ambulatory Visit: Payer: Medicare Other

## 2017-10-28 DIAGNOSIS — M25612 Stiffness of left shoulder, not elsewhere classified: Secondary | ICD-10-CM

## 2017-10-28 DIAGNOSIS — G8929 Other chronic pain: Secondary | ICD-10-CM

## 2017-10-28 DIAGNOSIS — M25512 Pain in left shoulder: Secondary | ICD-10-CM | POA: Diagnosis not present

## 2017-10-28 DIAGNOSIS — M6281 Muscle weakness (generalized): Secondary | ICD-10-CM

## 2017-10-28 NOTE — Therapy (Signed)
[place] [facility] PHYSICAL [facility]ORTS MEDICINE [address] [place], [address] Phone: [phone]   Fax:  [phone]  Physical Therapy Treatment  Patient Details  Name: Suzanne Snyder MRN: [id number] Date of Birth: April 27, 1950 Referring Provider (PT): Olga Millers MD   Encounter Date: 10/28/2017  PT End of Session - 10/28/17 1335    Visit Number  18    Number of Visits  26    Date for PT Re-Evaluation  11/25/17    Authorization Type  10/ 10 [employer]    PT Start Time  1120    PT Stop Time  1200    PT Time Calculation (min)  40 min    Activity Tolerance  Patient tolerated treatment well    Behavior During Therapy  [facility] for tasks assessed/performed       Past Medical History:  Diagnosis Date  . Anxiety   . Diabetes mellitus without complication (HCC)   . Osteoporosis     Past Surgical History:  Procedure Laterality Date  . 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 ROTATOR CUFF REPAIR Left 2014 and 2015  . SPINE SURGERY N/A 1984   L4-5, not sure of procedure    There were no vitals filed for this visit.  Subjective Assessment - 10/28/17 1331    Subjective  Patient reports she has been having less overall pain with exercises and movements today versus previous treatment sessions.    Pertinent History  Chronic history of L shoulder pain     Limitations  Lifting    Patient Stated Goals  To improve shoulder function and decrease pain    Currently in Pain?  No/denies    Pain Onset  More than a month ago         TREATMENT  Manual Therapy: Sidelying scapular mobilizations depression, downward/upward rotation,retraction to decrease increased pain and muscular spasms -- x  Therapeutic Exercise:  AROM with 1 # db - in supine- x 20 AROM forward overhead shoulder flexion with therapist support -- x20  Shoulder ER in sidelying  -- x 20 with 10 sec holds  Serratus punches in supine -- x 20 on the L side  Patient reports  increased fatigue at the end of the session   PT Education - 10/28/17 1335    Education Details  form/technique with exercise    Person(s) Educated  Patient    Methods  Explanation;Demonstration    Comprehension  Verbalized understanding;Returned demonstration          PT Long Term Goals - 10/28/17 1336      PT LONG TERM GOAL #1   Title  Patient will improve AROM to 120 degrees left shoulder with mild discomfort for reaching into cabinet or performing hair care     Baseline  95 AROM in sitting; 09/14/2017: 105 deg; 10/28/2017: 115    Time  8    Period  Weeks    Status  On-going      PT LONG TERM GOAL #2   Title  Patient will improve all shoulder strength 4+/5 for flexion and rotation to improve ability to manipulate objects in space.     Baseline  3/5 - 3+/5 within all measured motions; 09/14/2017: 3+/5 with all motion; 10/28/2017: 3+/5 for rotational movement and flexion improvement with extension and adduction to 4-/5    Time  8    Period  Weeks    Status  On-going      PT  LONG TERM GOAL #3   Title  Patient will  be independent with home program for strength and pain control for left UE/shoulder by 04/17/2015    Baseline  limited knowledge of pain control strategies or progression of exercises for left UE; 09/14/2017: Requires moderate cueing for exercise progression; 10/28/2017: Continues to require minimal cueing to correct    Time  8    Period  Weeks    Status  On-going            Plan - 10/28/17 1339    Clinical Impression Statement  Patient is improving towards long term goals with ability to perform greater amount of shoulder flexion with less abberrant movement compared to previous sessions. Patient continues to demonstrates overalll weakness within her shoulder musculature but is improving. Patient will benefit from futher skilled therapy focused on improving overhead function to reach into higher cabinets. Patient will benefit from further skilled therapy to return to  prior level of function.     Rehab Potential  Fair    Clinical Impairments Affecting Rehab Potential  (+) highly motivated, (-) age, decreased strength    PT Frequency  2x / week    PT Duration  8 weeks    PT Treatment/Interventions  Passive range of motion;Manual techniques;Electrical Stimulation;Cryotherapy;Moist Heat;Ultrasound;Therapeutic exercise;Therapeutic activities;Neuromuscular re-education;Patient/family education;Scar mobilization;Dry needling;Iontophoresis 4mg /ml Dexamethasone    PT Next Visit Plan  progress AROM exercises    PT Home Exercise Plan  See education section    Consulted and Agree with Plan of Care  Patient       Patient will benefit from skilled therapeutic intervention in order to improve the following deficits and impairments:  Pain, Impaired sensation, Decreased coordination, Decreased mobility, Increased muscle spasms, Decreased activity tolerance, Decreased endurance, Decreased range of motion, Decreased strength  Visit Diagnosis: Chronic left shoulder pain  Stiffness of left shoulder, not elsewhere classified  Muscle weakness (generalized)     Problem List There are no active problems to display for this patient.   [name], PT DPT 10/28/2017, 1:42 PM  [place] [facility] PHYSICAL AND SPORTS MEDICINE [address], [place], [address] Phone: [phone]   Fax:  [phone]  Name: Suzanne Snyder MRN: [id number] Date of Birth: [birth year]

## 2017-11-01 ENCOUNTER — Ambulatory Visit: Payer: Medicare Other

## 2017-11-01 DIAGNOSIS — M25512 Pain in left shoulder: Secondary | ICD-10-CM | POA: Diagnosis not present

## 2017-11-01 DIAGNOSIS — G8929 Other chronic pain: Secondary | ICD-10-CM

## 2017-11-01 DIAGNOSIS — M6281 Muscle weakness (generalized): Secondary | ICD-10-CM

## 2017-11-01 DIAGNOSIS — M25612 Stiffness of left shoulder, not elsewhere classified: Secondary | ICD-10-CM

## 2017-11-01 NOTE — Therapy (Signed)
[place] [facility] PHYSICAL [facility] SPORTS MEDICINE [address], [place], [address] Phone: [phone]   Fax:  [phone]  Physical Therapy Treatment  Patient Details  Name: Suzanne Snyder MRN: [id number] Date of Birth: 01-Aug-1950 Referring Provider (PT): Olga Millers MD   Encounter Date: 11/01/2017  PT End of Session - 11/01/17 1433    Visit Number  19    Number of Visits  26    Date for PT Re-Evaluation  11/25/17    Authorization Type  1/10 [employer]    PT Start Time  1345    PT Stop Time  1430    PT Time Calculation (min)  45 min    Activity Tolerance  Patient tolerated treatment well    Behavior During Therapy  [facility] for tasks assessed/performed       Past Medical History:  Diagnosis Date  . Anxiety   . Diabetes mellitus without complication (HCC)   . Osteoporosis     Past Surgical History:  Procedure Laterality Date  . 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 ROTATOR CUFF REPAIR Left 2014 and 2015  . SPINE SURGERY N/A 1984   L4-5, not sure of procedure    There were no vitals filed for this visit.  Subjective Assessment - 11/01/17 1356    Subjective  Patient reports she continues to have increased difficulty with performing L shoulder flexion. Patient reports the pain has been improving.     Pertinent History  Chronic history of L shoulder pain     Limitations  Lifting    Patient Stated Goals  To improve shoulder function and decrease pain    Currently in Pain?  No/denies    Pain Onset  More than a month ago       TREATMENT   Manual Therapy: Sidelying scapular mobilizations depression, downward/upward rotation,retraction to decrease increased pain and muscular spasms -- x   Therapeutic Exercise:   AROM forward overhead shoulder flexion with therapist support - x10 AROM with 1 # db - in supine- 2 x 20 Serratus punches in supine - 2 x 20 on the L side Triceps extension in supine - 2 x 20 1# db Resisted motion with  arm at 120 in supine - 2 x 20  Shoulder ER in sidelying -- x 10 with 5 sec holds  Shoulder flexion UE ranger - x 20 with therapist support    Patient reports increased fatigue at the end of the session     PT Education - 11/01/17 1413    Education Details  form/technique with exercise    Person(s) Educated  Patient    Methods  Explanation;Demonstration    Comprehension  Verbalized understanding;Returned demonstration          PT Long Term Goals - 10/28/17 1336      PT LONG TERM GOAL #1   Title  Patient will improve AROM to 120 degrees left shoulder with mild discomfort for reaching into cabinet or performing hair care     Baseline  95 AROM in sitting; 09/14/2017: 105 deg; 10/28/2017: 115    Time  8    Period  Weeks    Status  On-going      PT LONG TERM GOAL #2   Title  Patient will improve all shoulder strength 4+/5 for flexion and rotation to improve ability to manipulate objects in space.     Baseline  3/5 - 3+/5 within all measured motions; 09/14/2017: 3+/5 with  all motion; 10/28/2017: 3+/5 for rotational movement and flexion improvement with extension and adduction to 4-/5    Time  8    Period  Weeks    Status  On-going      PT LONG TERM GOAL #3   Title  Patient will  be independent with home program for strength and pain control for left UE/shoulder by 04/17/2015    Baseline  limited knowledge of pain control strategies or progression of exercises for left UE; 09/14/2017: Requires moderate cueing for exercise progression; 10/28/2017: Continues to require minimal cueing to correct    Time  8    Period  Weeks    Status  On-going            Plan - 11/01/17 1538    Clinical Impression Statement  Patient demosntrates increased fatigue along her deltiod musculature today after performing exercises indicating decreased muscular endurance based on decreased volume of exercises performed today. Patient demosntrates ability to perform shoulder flexion without therapist support,  however, she requires to perform abberrant movements to complete. Patient will benefit from further skilled therapy to return to prior level of function.     Rehab Potential  Fair    Clinical Impairments Affecting Rehab Potential  (+) highly motivated, (-) age, decreased strength    PT Frequency  2x / week    PT Duration  8 weeks    PT Treatment/Interventions  Passive range of motion;Manual techniques;Electrical Stimulation;Cryotherapy;Moist Heat;Ultrasound;Therapeutic exercise;Therapeutic activities;Neuromuscular re-education;Patient/family education;Scar mobilization;Dry needling;Iontophoresis 4mg /ml Dexamethasone    PT Next Visit Plan  progress AROM exercises    PT Home Exercise Plan  See education section    Consulted and Agree with Plan of Care  Patient       Patient will benefit from skilled therapeutic intervention in order to improve the following deficits and impairments:  Pain, Impaired sensation, Decreased coordination, Decreased mobility, Increased muscle spasms, Decreased activity tolerance, Decreased endurance, Decreased range of motion, Decreased strength  Visit Diagnosis: Chronic left shoulder pain  Stiffness of left shoulder, not elsewhere classified  Muscle weakness (generalized)     Problem List There are no active problems to display for this patient.   [name], PT DPT 11/01/2017, 3:41 PM  [facility] PHYSICAL AND SPORTS MEDICINE [address] [place], [address] Phone: [phone]   Fax:  [phone]  Name: SKYLEY GRANDMAISON MRN: [id number] Date of Birth: 1950/12/06

## 2017-11-04 ENCOUNTER — Ambulatory Visit: Payer: Medicare Other

## 2017-11-04 DIAGNOSIS — M25612 Stiffness of left shoulder, not elsewhere classified: Secondary | ICD-10-CM

## 2017-11-04 DIAGNOSIS — M25512 Pain in left shoulder: Secondary | ICD-10-CM | POA: Diagnosis not present

## 2017-11-04 DIAGNOSIS — G8929 Other chronic pain: Secondary | ICD-10-CM

## 2017-11-04 DIAGNOSIS — M6281 Muscle weakness (generalized): Secondary | ICD-10-CM

## 2017-11-04 NOTE — Therapy (Signed)
[facility] PHYSICAL [facility] SPORTS MEDICINE [address], [place], [address] Phone: [phone]   Fax:  [phone]  Physical Therapy Treatment  Patient Details  Name: Suzanne Snyder MRN: [id number] Date of Birth: 12/28/1950 Referring Provider (PT): Olga Millers MD   Encounter Date: 11/04/2017  PT End of Session - 11/04/17 1526    Visit Number  20    Number of Visits  26    Date for PT Re-Evaluation  11/25/17    Authorization Type  2/[employer]    PT Start Time  1505    PT Stop Time  1545    PT Time Calculation (min)  40 min    Activity Tolerance  Patient tolerated treatment well    Behavior During Therapy  [facility] for tasks assessed/performed       Past Medical History:  Diagnosis Date  . Anxiety   . Diabetes mellitus without complication (HCC)   . Osteoporosis     Past Surgical History:  Procedure Laterality Date  . 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 ROTATOR CUFF REPAIR Left 2014 and 2015  . SPINE SURGERY N/A 1984   L4-5, not sure of procedure    There were no vitals filed for this visit.  Subjective Assessment - 11/04/17 1513    Subjective  Patient reports increased pain with raising shoulder forward. Patient reports she her referring MD and said the progress was going well.     Pertinent History  Chronic history of L shoulder pain     Limitations  Lifting    Patient Stated Goals  To improve shoulder function and decrease pain    Currently in Pain?  No/denies    Pain Onset  More than a month ago         TREATMENT   Manual Therapy: Sidelying scapular mobilizations depression, downward/upward rotation,retraction to decrease increased pain and muscular spasms -- x   Therapeutic Exercise:   AROM forward overhead shoulder flexion with therapist support - x10 with 1# db B AROM with 1 # db - in supine-  x 20 Serratus punches in supine -  x 20 1# on the L side Shoulder ER in sidelying -- x 10 with 5 sec holds   Resisted motion with arm at 120 in supine 1# - 2 x 20  Shoulder circles with UE ranger - x 10      Patient reports increased fatigue at the end of the session   PT Education - 11/04/17 1525    Education Details  form/technique with exercise    Person(s) Educated  Patient    Methods  Demonstration;Explanation    Comprehension  Verbalized understanding;Returned demonstration          PT Long Term Goals - 10/28/17 1336      PT LONG TERM GOAL #1   Title  Patient will improve AROM to 120 degrees left shoulder with mild discomfort for reaching into cabinet or performing hair care     Baseline  95 AROM in sitting; 09/14/2017: 105 deg; 10/28/2017: 115    Time  8    Period  Weeks    Status  On-going      PT LONG TERM GOAL #2   Title  Patient will improve all shoulder strength 4+/5 for flexion and rotation to improve ability to manipulate objects in space.     Baseline  3/5 - 3+/5 within all measured motions; 09/14/2017: 3+/5 with all motion; 10/28/2017:  3+/5 for rotational movement and flexion improvement with extension and adduction to 4-/5    Time  8    Period  Weeks    Status  On-going      PT LONG TERM GOAL #3   Title  Patient will  be independent with home program for strength and pain control for left UE/shoulder by 04/17/2015    Baseline  limited knowledge of pain control strategies or progression of exercises for left UE; 09/14/2017: Requires moderate cueing for exercise progression; 10/28/2017: Continues to require minimal cueing to correct    Time  8    Period  Weeks    Status  On-going            Plan - 11/04/17 1527    Clinical Impression Statement  Continued to focus on improving strength with overhead and AROM motions; patient demonstrates improvement with exercises with ability to perform exercises with increased weight compared to previous session indicating functional carryover and improvement in motor control and strength. Patient will benefit from futher skilled  therapy to return to prior level of function.     Rehab Potential  Fair    Clinical Impairments Affecting Rehab Potential  (+) highly motivated, (-) age, decreased strength    PT Frequency  2x / week    PT Duration  8 weeks    PT Treatment/Interventions  Passive range of motion;Manual techniques;Electrical Stimulation;Cryotherapy;Moist Heat;Ultrasound;Therapeutic exercise;Therapeutic activities;Neuromuscular re-education;Patient/family education;Scar mobilization;Dry needling;Iontophoresis 4mg /ml Dexamethasone    PT Next Visit Plan  progress AROM exercises    PT Home Exercise Plan  See education section    Consulted and Agree with Plan of Care  Patient       Patient will benefit from skilled therapeutic intervention in order to improve the following deficits and impairments:  Pain, Impaired sensation, Decreased coordination, Decreased mobility, Increased muscle spasms, Decreased activity tolerance, Decreased endurance, Decreased range of motion, Decreased strength  Visit Diagnosis: Chronic left shoulder pain  Stiffness of left shoulder, not elsewhere classified  Muscle weakness (generalized)     Problem List There are no active problems to display for this patient.   [name], PT DPT 11/04/2017, 3:38 PM  [facility] PHYSICAL AND SPORTS MEDICINE [address], [place], [address] Phone: [phone]   Fax:  [phone]  Name: Suzanne Snyder MRN: [id number] Date of Birth: 1950/04/28

## 2017-11-08 ENCOUNTER — Ambulatory Visit: Payer: Medicare Other

## 2017-11-08 DIAGNOSIS — M25512 Pain in left shoulder: Principal | ICD-10-CM

## 2017-11-08 DIAGNOSIS — M545 Low back pain: Secondary | ICD-10-CM

## 2017-11-08 DIAGNOSIS — G8929 Other chronic pain: Secondary | ICD-10-CM

## 2017-11-08 DIAGNOSIS — M25612 Stiffness of left shoulder, not elsewhere classified: Secondary | ICD-10-CM

## 2017-11-08 DIAGNOSIS — M6281 Muscle weakness (generalized): Secondary | ICD-10-CM

## 2017-11-08 NOTE — Addendum Note (Signed)
Addended by: Bethanie Dicker on: 11/08/2017 04:07 PM   Modules accepted: Orders

## 2017-11-08 NOTE — Therapy (Signed)
[place] [facility] [address] [place], [address] Phone: [phone]   Fax:  [phone]  Physical Therapy Evaluation  Patient Details  Name: Suzanne Snyder MRN: [id number] Date of Birth: [birth year] Referring Provider (PT): Olga Millers MD   Encounter Date: 11/08/2017  PT End of Session - 11/08/17 1314    Visit Number  1    Number of Visits  13    Date for PT Re-Evaluation  12/20/17    Authorization Type  1/[employer]    PT Start Time  1307    PT Stop Time  1345    PT Time Calculation (min)  38 min    Activity Tolerance  Patient tolerated treatment well    Behavior During Therapy  [facility] for tasks assessed/performed       Past Medical History:  Diagnosis Date  . Anxiety   . Diabetes mellitus without complication (HCC)   . Osteoporosis     Past Surgical History:  Procedure Laterality Date  . 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 ROTATOR CUFF REPAIR Left 2014 and 2015  . SPINE SURGERY N/A 1984   L4-5, not sure of procedure    There were no vitals filed for this visit.   Subjective Assessment - 11/08/17 1256    Subjective  Patient reports increased low back pain that has been increased "since I was 67 years old" however reports an increase in pain over the past 6 months which she reports after doing standing activity and bending. Patient reports she feels the pain is from her scoliotic curve and states the pain as not been improving over the past two weeks. Patient states she has increased pain with standing for 4 hours mostly when she is cooking.     Pertinent History  Chronic history of L shoulder pain, sciolosis, laminectomy     Limitations  Lifting    Patient Stated Goals  To improve shoulder function and decrease pain    Currently in Pain?  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  3    Has the patient had a decrease in activity level because of a fear of falling?   Yes    Is the patient reluctant to leave their home because of a fear of falling?   No      Home Public house manager residence      Prior Function   Level of Independence  Independent    Vocation  Retired    Gaffer  N/A    Leisure  horseback riding(no longer performing)      Cognition   Overall Cognitive Status  Within Functional Limits for tasks assessed      Observation/Other Assessments   Observations  Backward leaning with exercise; Unable to performhip abduction without cueing      Sensation   Light Touch  Appears Intact      Functional Tests   Functional tests  Squat      Squat   Comments  Increased forward knee translatoin      Posture/Postural Control   Posture/Postural Control  Postural limitations    Postural Limitations  Right pelvic obliquity      ROM / Strength   AROM / PROM / Strength  AROM;Strength      AROM   AROM Assessment Site  Hip;Lumbar    Right/Left Hip  Right;Left    Right Hip Extension  20    Right Hip Flexion  130    Right Hip External Rotation   50    Right Hip Internal Rotation   45    Right Hip ABduction  30    Right Hip ADduction  30    Left Hip Extension  20    Left Hip Flexion  130    Left Hip External Rotation   50    Left Hip Internal Rotation   45    Left Hip ABduction  30    Left Hip  ADduction  30    Lumbar Flexion  WNL   Increased pain with rising to neutral   Lumbar Extension  75% limited    Lumbar - Right Side Bend  WNL    Lumbar - Left Side Bend  WNL   Increased pain on the R side   Lumbar - Right Rotation  WNl    Lumbar - Left Rotation  WNL      Strength   Strength Assessment Site  Hip    Right/Left Shoulder  Right;Left    Right/Left Hip  Right;Left    Right Hip Flexion  4+/5    Right Hip Extension  4-/5    Right Hip External Rotation   3+/5    Right Hip Internal Rotation  4+/5    Right Hip ABduction  3+/5    Left Hip Flexion  5/5    Left Hip Extension  4-/5    Left Hip External Rotation  4-/5    Left Hip Internal Rotation  5/5    Left Hip ABduction  3+/5      Palpation   Spinal mobility  Did not assess secondary to laminectomy    Palpation comment  Increased pain along R multifidus, glute max/med, and QL      Ambulation/Gait   Gait Comments  Decreased gait speed, increased instability with performance       Objective measurements completed on examination: See above findings.   TREATMENT Therapeutic Exercise Seated pelvic tilts -- x 20  Supine pelvic tilts -- x 20   Patient demonstrates no increase in pain at the end of the session     PT Education - 11/08/17 1312    Education Details  HEP: pelvic tilts     Person(s) Educated  Patient    Methods  Explanation;Demonstration    Comprehension  Verbalized understanding;Returned demonstration          PT Long Term Goals - 11/08/17 1419  PT LONG TERM GOAL #1   Title  Patient will improve AROM to 120 degrees left shoulder with mild discomfort for reaching into cabinet or performing hair care     Baseline  95 AROM in sitting; 09/14/2017: 105 deg; 10/28/2017: 115    Time  8    Period  Weeks    Status  On-going      PT LONG TERM GOAL #2   Title  Patient will improve all shoulder strength 4+/5 for flexion and rotation to improve ability to manipulate objects in space.     Baseline   3/5 - 3+/5 within all measured motions; 09/14/2017: 3+/5 with all motion; 10/28/2017: 3+/5 for rotational movement and flexion improvement with extension and adduction to 4-/5    Time  8    Period  Weeks    Status  On-going      PT LONG TERM GOAL #3   Title  Patient will  be independent with home program for strength and pain control for left UE/shoulder by 04/17/2015    Baseline  limited knowledge of pain control strategies or progression of exercises for left UE; 09/14/2017: Requires moderate cueing for exercise progression; 10/28/2017: Continues to require minimal cueing to correct    Time  8    Period  Weeks    Status  On-going      PT LONG TERM GOAL #4   Title  Patient will have a worst pain of a 3/10 to allow for performance of household activities without increased pain    Baseline  8/10    Time  8    Period  Weeks    Status  New      PT LONG TERM GOAL #5   Title  Patient will be able to stand for over 5 hours without increase in pain to allow for cooking for long periods of time wihtout increase in pain    Baseline  Increased pain after 4 hours from standing     Time  8    Period  Weeks    Status  New             Plan - 11/08/17 1404    Clinical Impression Statement  Patient is a 67 yo right hand dominant female presenting with increased R sided low back pain with insidious onset 6 months prior. Patient demonstrates increased lumbar dysfunction with increased multifius involvement and possible siatic nerve involvement. Patient also demonstrates singificant weakness along the R hip ER and extensors. Patient will benefit from furhter skilled therapy to return to prior level of function.     History and Personal Factors relevant to plan of care:  Laminectomy, L THR    Clinical Presentation  Evolving    Clinical Presentation due to:  Pain is worsening    Clinical Decision Making  Moderate    Rehab Potential  Fair    Clinical Impairments Affecting Rehab Potential  (+) highly  motivated, (-) age, decreased strength    PT Frequency  2x / week    PT Duration  8 weeks    PT Treatment/Interventions  Passive range of motion;Manual techniques;Electrical Stimulation;Cryotherapy;Moist Heat;Ultrasound;Therapeutic exercise;Therapeutic activities;Neuromuscular re-education;Patient/family education;Scar mobilization;Dry needling;Iontophoresis 4mg /ml Dexamethasone    PT Next Visit Plan  progress AROM exercises    PT Home Exercise Plan  See education section    Consulted and Agree with Plan of Care  Patient       Patient will benefit from skilled therapeutic intervention in order to improve  the following deficits and impairments:  Pain, Impaired sensation, Decreased coordination, Decreased mobility, Increased muscle spasms, Decreased activity tolerance, Decreased endurance, Decreased range of motion, Decreased strength  Visit Diagnosis: Chronic left shoulder pain  Stiffness of left shoulder, not elsewhere classified  Muscle weakness (generalized)  Chronic right-sided low back pain without sciatica     Problem List There are no active problems to display for this patient.   [name], PT DPT 11/08/2017, 4:04 PM  [place] [facility] PH[facility] SPORTS MEDICINE [address], [place], [address] Phone: [phone]   Fax:  [phone]  Name: Suzanne Snyder MRN: [id number] Date of Birth: 14-Jun-1950

## 2017-11-11 ENCOUNTER — Ambulatory Visit: Payer: Medicare Other

## 2017-11-11 DIAGNOSIS — M25612 Stiffness of left shoulder, not elsewhere classified: Secondary | ICD-10-CM

## 2017-11-11 DIAGNOSIS — M25512 Pain in left shoulder: Principal | ICD-10-CM

## 2017-11-11 DIAGNOSIS — G8929 Other chronic pain: Secondary | ICD-10-CM

## 2017-11-11 DIAGNOSIS — M6281 Muscle weakness (generalized): Secondary | ICD-10-CM

## 2017-11-11 NOTE — Therapy (Signed)
[place] [facility]ORTS [facility]IC[facility] [address] [place], [address] Phone: [phone]   Fax:  [phone]  Physical Therapy Treatment  Patient Details  Name: Suzanne Snyder MRN: [id number] Date of Birth: 08-Jun-1950 Referring Provider (PT): Olga Millers MD   Encounter Date: 11/11/2017  PT End of Session - 11/11/17 1153    Visit Number  2    Number of Visits  13    Date for PT Re-Evaluation  12/20/17    Authorization Type  [employer]    PT Start Time  1115    PT Stop Time  1200    PT Time Calculation (min)  45 min    Activity Tolerance  Patient tolerated treatment well    Behavior During Therapy  [facility] for tasks assessed/performed       Past Medical History:  Diagnosis Date  . Anxiety   . Diabetes mellitus without complication (HCC)   . Osteoporosis     Past Surgical History:  Procedure Laterality Date  . 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 ROTATOR CUFF REPAIR Left 2014 and 2015  . SPINE SURGERY N/A 1984   L4-5, not sure of procedure    There were no vitals filed for this visit.  Subjective Assessment - 11/11/17 1144    Subjective  Patient reports increased low back pain today to which she attributes to the weather. Patient states she would like to improve her ability to perform core exercises.     Pertinent History  Chronic history of L shoulder pain, sciolosis, laminectomy     Limitations  Lifting    Patient Stated Goals  To improve shoulder function and decrease pain    Currently in Pain?  Yes    Pain Score  4     Pain Location  Back    Pain Orientation  Right    Pain Descriptors / Indicators  Aching    Pain Type  Chronic pain    Pain Onset  More than a month ago    Pain Frequency  Intermittent       TREATMENT Manual Therapy STM performed to the mulitifidus and glute max to improve pain and spasms along the affected side with patient positioned in prone  Therapeutic Exercises  Dead bug in  hooklying - x 20  Bridges in hookyling with BTB - x 20  90-90 LTRS (stopped secondary to pain) - x 20  LTRS in hooklying - x 30 Sit to stand with BTB around knees - x20  Patient demonstrates increased fatigue at the end of the session   PT Education - 11/11/17 1152    Education Details  form/technique with exercise    Person(s) Educated  Patient    Methods  Explanation;Demonstration    Comprehension  Verbalized understanding;Returned demonstration          PT Long Term Goals - 11/08/17 1419      PT LONG TERM GOAL #1   Title  Patient will improve AROM to 120 degrees left shoulder with mild discomfort for reaching into cabinet or performing hair care     Baseline  95 AROM in sitting; 09/14/2017: 105 deg; 10/28/2017: 115    Time  8    Period  Weeks    Status  On-going      PT LONG TERM GOAL #2   Title  Patient will improve all shoulder strength 4+/5 for flexion and rotation to improve ability to manipulate objects in  space.     Baseline  3/5 - 3+/5 within all measured motions; 09/14/2017: 3+/5 with all motion; 10/28/2017: 3+/5 for rotational movement and flexion improvement with extension and adduction to 4-/5    Time  8    Period  Weeks    Status  On-going      PT LONG TERM GOAL #3   Title  Patient will  be independent with home program for strength and pain control for left UE/shoulder by 04/17/2015    Baseline  limited knowledge of pain control strategies or progression of exercises for left UE; 09/14/2017: Requires moderate cueing for exercise progression; 10/28/2017: Continues to require minimal cueing to correct    Time  8    Period  Weeks    Status  On-going      PT LONG TERM GOAL #4   Title  Patient will have a worst pain of a 3/10 to allow for performance of household activities without increased pain    Baseline  8/10    Time  8    Period  Weeks    Status  New      PT LONG TERM GOAL #5   Title  Patient will be able to stand for over 5 hours without increase in pain  to allow for cooking for long periods of time wihtout increase in pain    Baseline  Increased pain after 4 hours from standing     Time  8    Period  Weeks    Status  New            Plan - 11/11/17 1733    Clinical Impression Statement  Patient demonstrates improvement in pain and spasms after performing STM and bridges indicating improvement in lumbar mobility and decrease in muscular spasms. Although patient is improving, she continues to demonstrate increased pain with bending and will benefit from further skilled therapy to return to prior level of function.     Rehab Potential  Fair    Clinical Impairments Affecting Rehab Potential  (+) highly motivated, (-) age, decreased strength    PT Frequency  2x / week    PT Duration  8 weeks    PT Treatment/Interventions  Passive range of motion;Manual techniques;Electrical Stimulation;Cryotherapy;Moist Heat;Ultrasound;Therapeutic exercise;Therapeutic activities;Neuromuscular re-education;Patient/family education;Scar mobilization;Dry needling;Iontophoresis 4mg /ml Dexamethasone    PT Next Visit Plan  progress AROM exercises    PT Home Exercise Plan  See education section    Consulted and Agree with Plan of Care  Patient       Patient will benefit from skilled therapeutic intervention in order to improve the following deficits and impairments:  Pain, Impaired sensation, Decreased coordination, Decreased mobility, Increased muscle spasms, Decreased activity tolerance, Decreased endurance, Decreased range of motion, Decreased strength  Visit Diagnosis: Chronic left shoulder pain  Stiffness of left shoulder, not elsewhere classified  Muscle weakness (generalized)     Problem List There are no active problems to display for this patient.   [name], PT DPT 11/11/2017, 5:35 PM  [place] [facility] PH[facility] AND SPORTS MEDICINE [address] [place], [address] Phone: [phone]   Fax:   [phone]  Name: DULCIE GAMMON MRN: [id number] Date of Birth: 02-Dec-1950

## 2017-11-15 ENCOUNTER — Ambulatory Visit: Payer: Medicare Other | Attending: Orthopedic Surgery

## 2017-11-15 DIAGNOSIS — M25512 Pain in left shoulder: Secondary | ICD-10-CM | POA: Diagnosis not present

## 2017-11-15 DIAGNOSIS — G8929 Other chronic pain: Secondary | ICD-10-CM | POA: Insufficient documentation

## 2017-11-15 DIAGNOSIS — M6281 Muscle weakness (generalized): Secondary | ICD-10-CM | POA: Diagnosis present

## 2017-11-15 DIAGNOSIS — M545 Low back pain: Secondary | ICD-10-CM | POA: Insufficient documentation

## 2017-11-15 DIAGNOSIS — M25612 Stiffness of left shoulder, not elsewhere classified: Secondary | ICD-10-CM | POA: Diagnosis present

## 2017-11-15 NOTE — Therapy (Signed)
[place] [facility] [address], [place], [address] Phone: [phone]   Fax:  [phone]  Physical Therapy Treatment  Patient Details  Name: Suzanne Snyder MRN: [id number] Date of Birth: 09/25/1950 Referring Provider (PT): Olga Millers MD   Encounter Date: 11/15/2017  PT End of Session - 11/15/17 1146    Visit Number  3    Number of Visits  13    Date for PT Re-Evaluation  12/20/17    Authorization Type  3/[employer]    PT Start Time  1115    PT Stop Time  1200    PT Time Calculation (min)  45 min    Activity Tolerance  Patient tolerated treatment well    Behavior During Therapy  [facility] for tasks assessed/performed       Past Medical History:  Diagnosis Date  . Anxiety   . Diabetes mellitus without complication (HCC)   . Osteoporosis     Past Surgical History:  Procedure Laterality Date  . 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 ROTATOR CUFF REPAIR Left 2014 and 2015  . SPINE SURGERY N/A 1984   L4-5, not sure of procedure    There were no vitals filed for this visit.  Subjective Assessment - 11/15/17 1105    Subjective  Patient reports increased pain after the previous session but states her back is feeling much better now. Patient reports she has been performing exercises at home.     Pertinent History  Chronic history of L shoulder pain, sciolosis, laminectomy     Limitations  Lifting    Patient Stated Goals  To improve shoulder function and decrease pain    Currently in Pain?  Yes    Pain Score  3     Pain Location  Back    Pain Orientation  Right    Pain Descriptors / Indicators  Aching    Pain Type  Chronic pain    Pain Onset  More than a month ago    Pain Frequency  Intermittent         TREATMENT Manual Therapy STM performed to the mulitifidus and glute max to improve pain and spasms along the affected side with patient positioned in prone   Therapeutic Exercises  Shoulder flexion  in sitting - 2 x 10  Sitting shoulder ER in sitting - 2 x 10 Hip extension in prone - x 10 with leg straight, x10 with knee bent Dead bug in hooklying - x 20  Bridges in hookyling with BTB - x 20  90-90 LTRS with feet on physioball- x 20    Patient demonstrates increased fatigue at the end of the session     PT Education - 11/15/17 1112    Education Details  form/techniques with exercises     Person(s) Educated  Patient    Methods  Explanation;Demonstration    Comprehension  Verbalized understanding;Returned demonstration          PT Long Term Goals - 11/08/17 1419      PT LONG TERM GOAL #1   Title  Patient will improve AROM to 120 degrees left shoulder with mild discomfort for reaching into cabinet or performing hair care     Baseline  95 AROM in sitting; 09/14/2017: 105 deg; 10/28/2017: 115    Time  8    Period  Weeks    Status  On-going      PT LONG TERM GOAL #2  Title  Patient will improve all shoulder strength 4+/5 for flexion and rotation to improve ability to manipulate objects in space.     Baseline  3/5 - 3+/5 within all measured motions; 09/14/2017: 3+/5 with all motion; 10/28/2017: 3+/5 for rotational movement and flexion improvement with extension and adduction to 4-/5    Time  8    Period  Weeks    Status  On-going      PT LONG TERM GOAL #3   Title  Patient will  be independent with home program for strength and pain control for left UE/shoulder by 04/17/2015    Baseline  limited knowledge of pain control strategies or progression of exercises for left UE; 09/14/2017: Requires moderate cueing for exercise progression; 10/28/2017: Continues to require minimal cueing to correct    Time  8    Period  Weeks    Status  On-going      PT LONG TERM GOAL #4   Title  Patient will have a worst pain of a 3/10 to allow for performance of household activities without increased pain    Baseline  8/10    Time  8    Period  Weeks    Status  New      PT LONG TERM GOAL #5    Title  Patient will be able to stand for over 5 hours without increase in pain to allow for cooking for long periods of time wihtout increase in pain    Baseline  Increased pain after 4 hours from standing     Time  8    Period  Weeks    Status  New            Plan - 11/15/17 1249    Clinical Impression Statement  Patient demonstrates overall improvement with pain free motion, however, demonstrates poor coordination of hip and lumbar motion requiring UE support to perform exercises. Although patient is improving, she continues to have poor strength and muscle control with hip musculare and patient will benefit from further skilled therapy to return to prior level of function.     Rehab Potential  Fair    Clinical Impairments Affecting Rehab Potential  (+) highly motivated, (-) age, decreased strength    PT Frequency  2x / week    PT Duration  8 weeks    PT Treatment/Interventions  Passive range of motion;Manual techniques;Electrical Stimulation;Cryotherapy;Moist Heat;Ultrasound;Therapeutic exercise;Therapeutic activities;Neuromuscular re-education;Patient/family education;Scar mobilization;Dry needling;Iontophoresis 4mg /ml Dexamethasone    PT Next Visit Plan  progress AROM exercises    PT Home Exercise Plan  See education section    Consulted and Agree with Plan of Care  Patient       Patient will benefit from skilled therapeutic intervention in order to improve the following deficits and impairments:  Pain, Impaired sensation, Decreased coordination, Decreased mobility, Increased muscle spasms, Decreased activity tolerance, Decreased endurance, Decreased range of motion, Decreased strength  Visit Diagnosis: Chronic left shoulder pain  Muscle weakness (generalized)  Stiffness of left shoulder, not elsewhere classified  Chronic right-sided low back pain without sciatica     Problem List There are no active problems to display for this patient.   [name], PT  DPT 11/15/2017, 12:52 PM  [place] [facility] SPORTS MEDICINE [address], [place], [address] Phone: [phone]   Fax:  [phone]  Name: LOUVENIA GOLOMB MRN: [id number] Date of Birth: 15-May-1950

## 2017-11-18 ENCOUNTER — Ambulatory Visit: Payer: Medicare Other

## 2017-11-18 DIAGNOSIS — M25512 Pain in left shoulder: Principal | ICD-10-CM

## 2017-11-18 DIAGNOSIS — M6281 Muscle weakness (generalized): Secondary | ICD-10-CM

## 2017-11-18 DIAGNOSIS — G8929 Other chronic pain: Secondary | ICD-10-CM

## 2017-11-18 DIAGNOSIS — M545 Low back pain, unspecified: Secondary | ICD-10-CM

## 2017-11-18 DIAGNOSIS — M25612 Stiffness of left shoulder, not elsewhere classified: Secondary | ICD-10-CM

## 2017-11-18 NOTE — Therapy (Signed)
[facility] [address], [place], [address] Phone: [phone]   Fax:  [phone]  Physical Therapy Treatment  Patient Details  Name: Suzanne Snyder MRN: [id number] Date of Birth: [birth year] Referring Provider (PT): Olga Millers MD   Encounter Date: 11/18/2017  PT End of Session - 11/18/17 1413    Visit Number  4    Number of Visits  13    Date for PT Re-Evaluation  12/20/17    Authorization Type  4/[employer]    PT Start Time  1345    PT Stop Time  1430    PT Time Calculation (min)  45 min    Activity Tolerance  Patient tolerated treatment well    Behavior During Therapy  [facility] for tasks assessed/performed       Past Medical History:  Diagnosis Date  . Anxiety   . Diabetes mellitus without complication (HCC)   . Osteoporosis     Past Surgical History:  Procedure Laterality Date  . 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 ROTATOR CUFF REPAIR Left 2014 and 2015  . SPINE SURGERY N/A 1984   L4-5, not sure of procedure    There were no vitals filed for this visit.  Subjective Assessment - 11/18/17 1353    Subjective  Patient reports she felt fine after the previous session. Patient reports overall her pain is a 3/10 which is improved compared to previous sessions.     Pertinent History  Chronic history of L shoulder pain, sciolosis, laminectomy     Limitations  Lifting    Patient Stated Goals  To improve shoulder function and decrease pain    Currently in Pain?  Yes    Pain Score  3     Pain Location  Back    Pain Orientation  Right    Pain Descriptors / Indicators  Aching    Pain Type  Chronic pain    Pain Onset  More than a month ago    Pain Frequency  Intermittent       TREATMENT Manual Therapy STM performed to the mulitifidus and glute max to improve pain and spasms along the affected side with patient positioned in prone   Therapeutic Exercises  Shoulder flexion in sitting - 2 x  10  Sitting shoulder ER in sitting - 2 x 10 Hip IR in prone - x10 Hip extension in prone - x20 with knee bent Hip abduction in standing - 2 x 10  Squats in standing - x 10    Patient demonstrates increased fatigue at the end of the session   PT Education - 11/18/17 1413    Education Details  form/technique with exercises    Person(s) Educated  Patient    Methods  Explanation;Demonstration    Comprehension  Verbalized understanding;Returned demonstration          PT Long Term Goals - 11/08/17 1419      PT LONG TERM GOAL #1   Title  Patient will improve AROM to 120 degrees left shoulder with mild discomfort for reaching into cabinet or performing hair care     Baseline  95 AROM in sitting; 09/14/2017: 105 deg; 10/28/2017: 115    Time  8    Period  Weeks    Status  On-going      PT LONG TERM GOAL #2   Title  Patient will improve all shoulder strength 4+/5 for flexion and rotation to  improve ability to manipulate objects in space.     Baseline  3/5 - 3+/5 within all measured motions; 09/14/2017: 3+/5 with all motion; 10/28/2017: 3+/5 for rotational movement and flexion improvement with extension and adduction to 4-/5    Time  8    Period  Weeks    Status  On-going      PT LONG TERM GOAL #3   Title  Patient will  be independent with home program for strength and pain control for left UE/shoulder by 04/17/2015    Baseline  limited knowledge of pain control strategies or progression of exercises for left UE; 09/14/2017: Requires moderate cueing for exercise progression; 10/28/2017: Continues to require minimal cueing to correct    Time  8    Period  Weeks    Status  On-going      PT LONG TERM GOAL #4   Title  Patient will have a worst pain of a 3/10 to allow for performance of household activities without increased pain    Baseline  8/10    Time  8    Period  Weeks    Status  New      PT LONG TERM GOAL #5   Title  Patient will be able to stand for over 5 hours without increase in  pain to allow for cooking for long periods of time wihtout increase in pain    Baseline  Increased pain after 4 hours from standing     Time  8    Period  Weeks    Status  New            Plan - 11/18/17 1518    Clinical Impression Statement  Patient demonstrates improvement with exercise performance and is able to perform a greater amount of exercises without increase in pain indicating functional carryover between sessions. Although patient is improving, she continues to have increased difficulty with performing lumbar movements without extraneous movements indicating poor coordination. Patient will benefit from further skilled therapy to return to prior level of function.     Rehab Potential  Fair    Clinical Impairments Affecting Rehab Potential  (+) highly motivated, (-) age, decreased strength    PT Frequency  2x / week    PT Duration  8 weeks    PT Treatment/Interventions  Passive range of motion;Manual techniques;Electrical Stimulation;Cryotherapy;Moist Heat;Ultrasound;Therapeutic exercise;Therapeutic activities;Neuromuscular re-education;Patient/family education;Scar mobilization;Dry needling;Iontophoresis 4mg /ml Dexamethasone    PT Next Visit Plan  progress AROM exercises    PT Home Exercise Plan  See education section    Consulted and Agree with Plan of Care  Patient       Patient will benefit from skilled therapeutic intervention in order to improve the following deficits and impairments:  Pain, Impaired sensation, Decreased coordination, Decreased mobility, Increased muscle spasms, Decreased activity tolerance, Decreased endurance, Decreased range of motion, Decreased strength  Visit Diagnosis: Chronic left shoulder pain  Muscle weakness (generalized)  Stiffness of left shoulder, not elsewhere classified  Chronic right-sided low back pain without sciatica     Problem List There are no active problems to display for this patient.   [name], PT  DPT 11/18/2017, 3:20 PM  [place] [address] [facility] PHYSICAL AND SPORTS MEDICINE [address], [place], [address] Phone: [phone]   Fax:  [phone]  Name: ZAINEB NOWACZYK MRN: [id number] Date of Birth: February 20, 1950

## 2017-11-23 ENCOUNTER — Ambulatory Visit: Payer: Medicare Other

## 2017-11-23 DIAGNOSIS — M25612 Stiffness of left shoulder, not elsewhere classified: Secondary | ICD-10-CM

## 2017-11-23 DIAGNOSIS — M25512 Pain in left shoulder: Secondary | ICD-10-CM | POA: Diagnosis not present

## 2017-11-23 DIAGNOSIS — M6281 Muscle weakness (generalized): Secondary | ICD-10-CM

## 2017-11-23 DIAGNOSIS — G8929 Other chronic pain: Secondary | ICD-10-CM

## 2017-11-23 NOTE — Therapy (Signed)
[place] [facility] [address] [place], [place], [address] Phone: [phone]   Fax:  [phone]  Physical Therapy Treatment  Patient Details  Name: Suzanne LeberDeborah L Harrower MRN: [id number] Date of Birth: November 07, 1950 Referring Provider (PT): Olga MillersKamath MD   Encounter Date: 11/23/2017  PT End of Session - 11/23/17 1225    Visit Number  5    Number of Visits  13    Date for PT Re-Evaluation  12/20/17    Authorization Type  5/10 [employer]    PT Start Time  1120    PT Stop Time  1200    PT Time Calculation (min)  40 min    Activity Tolerance  Patient tolerated treatment well    Behavior During Therapy  [facility]       Past Medical History:  Diagnosis Date  . Anxiety   . Diabetes mellitus without complication (HCC)   . Osteoporosis     Past Surgical History:  Procedure Laterality Date  . 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 ROTATOR CUFF REPAIR Left 2014 and 2015  . SPINE SURGERY N/A 1984   L4-5, not sure of procedure    There were no vitals filed for this visit.  Subjective Assessment - 11/23/17 1223    Subjective  Patient reports increased pain and spasms after trying to donn a dress and zippering up a zipper. Patient states increased pain with raising her arm overhead.     Pertinent History  Chronic history of L shoulder pain, sciolosis, laminectomy     Limitations  Lifting    Patient Stated Goals  To improve shoulder function and decrease pain    Currently in Pain?  Yes    Pain Score  5     Pain Location  Shoulder    Pain Orientation  Left    Pain Descriptors / Indicators  Aching    Pain Type  Chronic pain    Pain Onset  More than a month ago    Pain Frequency  Intermittent       TREATMENT Manual Therapy STM performed to the Upper trap to improve pain and spasms along the affected side with patient positioned in prone   Therapeutic Exercises  Sit to stands with RTB around knees-  x 10  Shoulder flexion in sitting - 2 x 10 with AAROM from therapist Paloff press with RTB with therapist support - x 10 Straight arm lumbar rotation - x 10 B Standing scapular retraction in standing - x 20 RTB Shoulder Extension in standing with RTB - x 20    Patient demonstrates increased fatigue at the end of the session    PT Education - 11/23/17 1225    Education Details  form/technique with exercises    Person(s) Educated  Patient    Methods  Explanation;Demonstration    Comprehension  Verbalized understanding;Returned demonstration          PT Long Term Goals - 11/08/17 1419      PT LONG TERM GOAL #1   Title  Patient will improve AROM to 120 degrees left shoulder with mild discomfort for reaching into cabinet or performing hair care     Baseline  95 AROM in sitting; 09/14/2017: 105 deg; 10/28/2017: 115    Time  8    Period  Weeks    Status  On-going      PT LONG TERM GOAL #2   Title  Patient  will improve all shoulder strength 4+/5 for flexion and rotation to improve ability to manipulate objects in space.     Baseline  3/5 - 3+/5 within all measured motions; 09/14/2017: 3+/5 with all motion; 10/28/2017: 3+/5 for rotational movement and flexion improvement with extension and adduction to 4-/5    Time  8    Period  Weeks    Status  On-going      PT LONG TERM GOAL #3   Title  Patient will  be independent with home program for strength and pain control for left UE/shoulder by 04/17/2015    Baseline  limited knowledge of pain control strategies or progression of exercises for left UE; 09/14/2017: Requires moderate cueing for exercise progression; 10/28/2017: Continues to require minimal cueing to correct    Time  8    Period  Weeks    Status  On-going      PT LONG TERM GOAL #4   Title  Patient will have a worst pain of a 3/10 to allow for performance of household activities without increased pain    Baseline  8/10    Time  8    Period  Weeks    Status  New      PT LONG  TERM GOAL #5   Title  Patient will be able to stand for over 5 hours without increase in pain to allow for cooking for long periods of time wihtout increase in pain    Baseline  Increased pain after 4 hours from standing     Time  8    Period  Weeks    Status  New            Plan - 11/23/17 1225    Clinical Impression Statement  Improvement in flexion after performing AAROM along the shoulder and performing STM to the upper trap. Patient demonstrates decreased pain at the end of the session, however continues to demonstrate poor motor control with hip and UE movements and will benefit from further skilled therapy to return to prior level of function.     Rehab Potential  Fair    Clinical Impairments Affecting Rehab Potential  (+) highly motivated, (-) age, decreased strength    PT Frequency  2x / week    PT Duration  8 weeks    PT Treatment/Interventions  Passive range of motion;Manual techniques;Electrical Stimulation;Cryotherapy;Moist Heat;Ultrasound;Therapeutic exercise;Therapeutic activities;Neuromuscular re-education;Patient/family education;Scar mobilization;Dry needling;Iontophoresis 4mg /ml Dexamethasone    PT Next Visit Plan  progress AROM exercises    PT Home Exercise Plan  See education section    Consulted and Agree with Plan of Care  Patient       Patient will benefit from skilled therapeutic intervention in order to improve the following deficits and impairments:  Pain, Impaired sensation, Decreased coordination, Decreased mobility, Increased muscle spasms, Decreased activity tolerance, Decreased endurance, Decreased range of motion, Decreased strength  Visit Diagnosis: Chronic left shoulder pain  Muscle weakness (generalized)  Stiffness of left shoulder, not elsewhere classified     Problem List There are no active problems to display for this patient.   Suzanne Snyder, PT DPT 11/23/2017, 12:30 PM  [facility] PHYSICAL AND  SPORTS MEDICINE [address] [place], [address] Phone: [phone]   Fax:  [phone]  Name: TAJ ARTEAGA MRN: [id number] Date of Birth: 11-08-1950

## 2017-11-24 ENCOUNTER — Ambulatory Visit: Payer: Medicare Other

## 2017-11-24 DIAGNOSIS — M545 Low back pain: Secondary | ICD-10-CM

## 2017-11-24 DIAGNOSIS — M25512 Pain in left shoulder: Principal | ICD-10-CM

## 2017-11-24 DIAGNOSIS — G8929 Other chronic pain: Secondary | ICD-10-CM

## 2017-11-24 DIAGNOSIS — M6281 Muscle weakness (generalized): Secondary | ICD-10-CM

## 2017-11-24 NOTE — Therapy (Signed)
[place] [facility]IC[facility] [address] [place], [place], [address] Phone: [phone]   Fax:  [phone]  Physical Therapy Treatment  Patient Details  Name: Suzanne Snyder MRN: [id number] Date of Birth: [birth year] Referring Provider (PT): Olga MillersKamath MD   Encounter Date: 11/24/2017  PT End of Session - 11/24/17 1623    Visit Number  6    Number of Visits  13    Date for PT Re-Evaluation  12/20/17    Authorization Type  6/10 [employer]    PT Start Time  1515    PT Stop Time  1600    PT Time Calculation (min)  45 min    Activity Tolerance  Patient tolerated treatment well    Behavior During Therapy  [facility] assessed/performed       Past Medical History:  Diagnosis Date  . Anxiety   . Diabetes mellitus without complication (HCC)   . Osteoporosis     Past Surgical History:  Procedure Laterality Date  . 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 ROTATOR CUFF REPAIR Left 2014 and 2015  . SPINE SURGERY N/A 1984   L4-5, not sure of procedure    There were no vitals filed for this visit.  Subjective Assessment - 11/24/17 1622    Subjective  Patient reports she did not sleep well last night and states she "hurts all over". Patient states she does not remember a date she has slept over 4 hours in one night.     Pertinent History  Chronic history of L shoulder pain, sciolosis, laminectomy     Limitations  Lifting    Patient Stated Goals  To improve shoulder function and decrease pain    Currently in Pain?  Yes    Pain Score  5     Pain Location  Back    Pain Orientation  Lower    Pain Descriptors / Indicators  Aching    Pain Type  Chronic pain    Pain Onset  More than a month ago    Pain Frequency  Intermittent         TREATMENT Manual Therapy STM performed to the Upper trap to improve pain and spasms along the affected side with patient positioned in prone  Therapeutic Exercises  Shoulder flexion in sitting  - x 10 with AAROM from therapist Paloff press with RTB with therapist support - x 10 Hip extension in standing -- x 15 Hip abduction with furniture slider -- x 15   Patient demonstrates increased fatigue at the end of the session     PT Education - 11/24/17 1623    Education Details  form/technique with exercise; educated to follow up with sleeping specialist    Person(s) Educated  Patient    Methods  Explanation;Demonstration    Comprehension  Verbalized understanding;Returned demonstration          PT Long Term Goals - 11/08/17 1419      PT LONG TERM GOAL #1   Title  Patient will improve AROM to 120 degrees left shoulder with mild discomfort for reaching into cabinet or performing hair care     Baseline  95 AROM in sitting; 09/14/2017: 105 deg; 10/28/2017: 115    Time  8    Period  Weeks    Status  On-going      PT LONG TERM GOAL #2   Title  Patient will improve all shoulder strength 4+/5 for flexion  and rotation to improve ability to manipulate objects in space.     Baseline  3/5 - 3+/5 within all measured motions; 09/14/2017: 3+/5 with all motion; 10/28/2017: 3+/5 for rotational movement and flexion improvement with extension and adduction to 4-/5    Time  8    Period  Weeks    Status  On-going      PT LONG TERM GOAL #3   Title  Patient will  be independent with home program for strength and pain control for left UE/shoulder by 04/17/2015    Baseline  limited knowledge of pain control strategies or progression of exercises for left UE; 09/14/2017: Requires moderate cueing for exercise progression; 10/28/2017: Continues to require minimal cueing to correct    Time  8    Period  Weeks    Status  On-going      PT LONG TERM GOAL #4   Title  Patient will have a worst pain of a 3/10 to allow for performance of household activities without increased pain    Baseline  8/10    Time  8    Period  Weeks    Status  New      PT LONG TERM GOAL #5   Title  Patient will be able to  stand for over 5 hours without increase in pain to allow for cooking for long periods of time wihtout increase in pain    Baseline  Increased pain after 4 hours from standing     Time  8    Period  Weeks    Status  New            Plan - 11/24/17 1624    Clinical Impression Statement  Patient demonstrates a decrease in pain after performing manual therapy indicating decreased muscular spasms and a decrease in pain. Educated patient on sleep hygiene and decreased stress before going to bed at night. Patient will benefit from further skilled therapy to return to prior level of function.      Rehab Potential  Fair    Clinical Impairments Affecting Rehab Potential  (+) highly motivated, (-) age, decreased strength    PT Frequency  2x / week    PT Duration  8 weeks    PT Treatment/Interventions  Passive range of motion;Manual techniques;Electrical Stimulation;Cryotherapy;Moist Heat;Ultrasound;Therapeutic exercise;Therapeutic activities;Neuromuscular re-education;Patient/family education;Scar mobilization;Dry needling;Iontophoresis 4mg /ml Dexamethasone    PT Next Visit Plan  progress AROM exercises    PT Home Exercise Plan  See education section    Consulted and Agree with Plan of Care  Patient       Patient will benefit from skilled therapeutic intervention in order to improve the following deficits and impairments:  Pain, Impaired sensation, Decreased coordination, Decreased mobility, Increased muscle spasms, Decreased activity tolerance, Decreased endurance, Decreased range of motion, Decreased strength  Visit Diagnosis: Chronic left shoulder pain  Muscle weakness (generalized)  Chronic right-sided low back pain without sciatica     Problem List There are no active problems to display for this patient.   [name], PT DPT 11/24/2017, 4:27 PM  [place] [facility] PHYSICAL AND SPORTS MEDICINE [address] [place], [address] Phone:  [phone]   Fax:  [phone]  Name: Suzanne Snyder MRN: [id number] Date of Birth: 12-24-1950

## 2017-12-01 ENCOUNTER — Ambulatory Visit: Payer: Medicare Other

## 2017-12-01 DIAGNOSIS — M25512 Pain in left shoulder: Principal | ICD-10-CM

## 2017-12-01 DIAGNOSIS — M545 Low back pain: Secondary | ICD-10-CM

## 2017-12-01 DIAGNOSIS — M6281 Muscle weakness (generalized): Secondary | ICD-10-CM

## 2017-12-01 DIAGNOSIS — G8929 Other chronic pain: Secondary | ICD-10-CM

## 2017-12-01 NOTE — Therapy (Signed)
[place] [facility] PHYS[facility] AND [facility]TS MEDICINE [address] [place], [address] Phone: [phone]   Fax:  [phone]  Physical Therapy Treatment  Patient Details  Name: Suzanne Snyder MRN: [id number] Date of Birth: October 28, 1950 Referring Provider (PT): Olga Millers MD   Encounter Date: 12/01/2017  PT End of Session - 12/01/17 0918    Visit Number  7    Number of Visits  13    Date for PT Re-Evaluation  12/20/17    Authorization Type  7/[employer]    PT Start Time  0900    PT Stop Time  0945    PT Time Calculation (min)  45 min    Activity Tolerance  Patient tolerated treatment well    Behavior During Therapy  [facility] for tasks assessed/performed       Past Medical History:  Diagnosis Date  . Anxiety   . Diabetes mellitus without complication (HCC)   . Osteoporosis     Past Surgical History:  Procedure Laterality Date  . 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 ROTATOR CUFF REPAIR Left 2014 and 2015  . SPINE SURGERY N/A 1984   L4-5, not sure of procedure    There were no vitals filed for this visit.  Subjective Assessment - 12/01/17 0907    Subjective  Patient reports she continues to not sleep well and states she's has been up since 4 am this morning. Patient reports increased tension in her lower back.     Pertinent History  Chronic history of L shoulder pain, sciolosis, laminectomy     Limitations  Lifting    Patient Stated Goals  To improve shoulder function and decrease pain    Currently in Pain?  Yes    Pain Score  3     Pain Location  Back    Pain Orientation  Lower    Pain Descriptors / Indicators  Aching    Pain Type  Chronic pain    Pain Onset  More than a month ago    Pain Frequency  Intermittent        TREATMENT   Therapeutic Exercises Shoulder flexion in sitting - x 10 on L side  Shoulder ER in sitting - x 10 on L side Single leg bridges in hooklying - x 10 B  Hip circles in standing with hip slider -- x  20  Hip abduction in standing -  x 10 @ hip machine  Hip extension in standing -- x 10 @ hip machine 55#   Patient demonstrates increased fatigue at the end of the session  PT Education - 12/01/17 0918    Education Details  form/technique with exercise    Person(s) Educated  Patient    Methods  Explanation;Demonstration    Comprehension  Verbalized understanding;Returned demonstration          PT Long Term Goals - 11/08/17 1419      PT LONG TERM GOAL #1   Title  Patient will improve AROM to 120 degrees left shoulder with mild discomfort for reaching into cabinet or performing hair care     Baseline  95 AROM in sitting; 09/14/2017: 105 deg; 10/28/2017: 115    Time  8    Period  Weeks    Status  On-going      PT LONG TERM GOAL #2   Title  Patient will improve all shoulder strength 4+/5 for flexion and rotation to improve ability to manipulate objects in  space.     Baseline  3/5 - 3+/5 within all measured motions; 09/14/2017: 3+/5 with all motion; 10/28/2017: 3+/5 for rotational movement and flexion improvement with extension and adduction to 4-/5    Time  8    Period  Weeks    Status  On-going      PT LONG TERM GOAL #3   Title  Patient will  be independent with home program for strength and pain control for left UE/shoulder by 04/17/2015    Baseline  limited knowledge of pain control strategies or progression of exercises for left UE; 09/14/2017: Requires moderate cueing for exercise progression; 10/28/2017: Continues to require minimal cueing to correct    Time  8    Period  Weeks    Status  On-going      PT LONG TERM GOAL #4   Title  Patient will have a worst pain of a 3/10 to allow for performance of household activities without increased pain    Baseline  8/10    Time  8    Period  Weeks    Status  New      PT LONG TERM GOAL #5   Title  Patient will be able to stand for over 5 hours without increase in pain to allow for cooking for long periods of time wihtout increase in  pain    Baseline  Increased pain after 4 hours from standing     Time  8    Period  Weeks    Status  New            Plan - 12/01/17 [id number]    Clinical Impression Statement  Patient demonstrates improvement in pain and spasms today compared to previous sessions indicating functional carryover between the previous session.  Patient continues to have difficulty with lumbar extension and focused on improving  mobility and hip strengthening today as she conitnues to lack in these categories. Patient will benefit from further skilled therapy to return to prior level of function.     Rehab Potential  Fair    Clinical Impairments Affecting Rehab Potential  (+) highly motivated, (-) age, decreased strength    PT Frequency  2x / week    PT Duration  8 weeks    PT Treatment/Interventions  Passive range of motion;Manual techniques;Electrical Stimulation;Cryotherapy;Moist Heat;Ultrasound;Therapeutic exercise;Therapeutic activities;Neuromuscular re-education;Patient/family education;Scar mobilization;Dry needling;Iontophoresis 4mg /ml Dexamethasone    PT Next Visit Plan  progress AROM exercises    PT Home Exercise Plan  See education section    Consulted and Agree with Plan of Care  Patient       Patient will benefit from skilled therapeutic intervention in order to improve the following deficits and impairments:  Pain, Impaired sensation, Decreased coordination, Decreased mobility, Increased muscle spasms, Decreased activity tolerance, Decreased endurance, Decreased range of motion, Decreased strength  Visit Diagnosis: Chronic left shoulder pain  Muscle weakness (generalized)  Chronic right-sided low back pain without sciatica     Problem List There are no active problems to display for this patient.   Myrene GalasWesley Linnie Delgrande, PT DPT 12/01/2017, 9:28 AM  [facility] [address] [place], [place], [address] Phone: [phone]    Fax:  [phone]  Name: Henreitta LeberDeborah L Zee MRN: [id number] Date of Birth: 06-Aug-1950

## 2017-12-06 ENCOUNTER — Ambulatory Visit: Payer: Medicare Other

## 2017-12-08 ENCOUNTER — Ambulatory Visit: Payer: Medicare Other

## 2017-12-08 DIAGNOSIS — M545 Low back pain: Secondary | ICD-10-CM

## 2017-12-08 DIAGNOSIS — M25512 Pain in left shoulder: Principal | ICD-10-CM

## 2017-12-08 DIAGNOSIS — M6281 Muscle weakness (generalized): Secondary | ICD-10-CM

## 2017-12-08 DIAGNOSIS — G8929 Other chronic pain: Secondary | ICD-10-CM

## 2017-12-08 NOTE — Therapy (Signed)
[place] [facility] PHYSICAL [facility] SPORTS MEDICINE [address], [place], [address] Phone: [phone]   Fax:  [phone]  Physical Therapy Treatment  Patient Details  Name: Suzanne Snyder MRN: [id number] Date of Birth: [birth year] Referring Provider (PT): Olga Millers MD   Encounter Date: 12/08/2017  PT End of Session - 12/08/17 0948    Visit Number  8    Number of Visits  13    Date for PT Re-Evaluation  12/20/17    Authorization Type  7/10 [employer]    PT Start Time  0900    PT Stop Time  0945    PT Time Calculation (min)  45 min    Activity Tolerance  Patient tolerated treatment well    Behavior During Therapy  [facility] for tasks assessed/performed       Past Medical History:  Diagnosis Date  . Anxiety   . Diabetes mellitus without complication (HCC)   . Osteoporosis     Past Surgical History:  Procedure Laterality Date  . 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 ROTATOR CUFF REPAIR Left 2014 and 2015  . SPINE SURGERY N/A 1984   L4-5, not sure of procedure    There were no vitals filed for this visit.  Subjective Assessment - 12/08/17 0936    Subjective  Patient reports increased shoulder pian and low back pain from making a pork roast this past Friday. Patient reports she is feeling stressed from her daughter and er living situation    Pertinent History  Chronic history of L shoulder pain, sciolosis, laminectomy     Limitations  Lifting    Patient Stated Goals  To improve shoulder function and decrease pain    Currently in Pain?  Yes    Pain Score  8     Pain Location  Back   shoulder and back    Pain Orientation  Lower    Pain Descriptors / Indicators  Aching    Pain Type  Chronic pain    Pain Onset  More than a month ago    Pain Frequency  Intermittent        TREATMENT   Therapeutic Exercises Shoulder flexion in sitting - x 10 on L side Shoulder abduction in sititng - x 10   Shoulder ER in sitting - x 10 on L  side Gluteal isometrics - x 3 with 5 sec holds  Stool pushes with performing hip flexion - x 20 Scapular retraction in standing - x 20 with 5 sec holds   Patient demonstrates increased fatigue at the end of the session   PT Education - 12/08/17 0947    Education Details  form/technique with exercise    Person(s) Educated  Patient    Methods  Explanation;Demonstration    Comprehension  Verbalized understanding;Returned demonstration          PT Long Term Goals - 11/08/17 1419      PT LONG TERM GOAL #1   Title  Patient will improve AROM to 120 degrees left shoulder with mild discomfort for reaching into cabinet or performing hair care     Baseline  95 AROM in sitting; 09/14/2017: 105 deg; 10/28/2017: 115    Time  8    Period  Weeks    Status  On-going      PT LONG TERM GOAL #2   Title  Patient will improve all shoulder strength 4+/5 for flexion and rotation to improve ability to manipulate  objects in space.     Baseline  3/5 - 3+/5 within all measured motions; 09/14/2017: 3+/5 with all motion; 10/28/2017: 3+/5 for rotational movement and flexion improvement with extension and adduction to 4-/5    Time  8    Period  Weeks    Status  On-going      PT LONG TERM GOAL #3   Title  Patient will  be independent with home program for strength and pain control for left UE/shoulder by 04/17/2015    Baseline  limited knowledge of pain control strategies or progression of exercises for left UE; 09/14/2017: Requires moderate cueing for exercise progression; 10/28/2017: Continues to require minimal cueing to correct    Time  8    Period  Weeks    Status  On-going      PT LONG TERM GOAL #4   Title  Patient will have a worst pain of a 3/10 to allow for performance of household activities without increased pain    Baseline  8/10    Time  8    Period  Weeks    Status  New      PT LONG TERM GOAL #5   Title  Patient will be able to stand for over 5 hours without increase in pain to allow for  cooking for long periods of time wihtout increase in pain    Baseline  Increased pain after 4 hours from standing     Time  8    Period  Weeks    Status  New            Plan - 12/08/17 0950    Clinical Impression Statement  Patient dmeonstrates decreased pain and spasms after peforming mobility and performing manual therapy indicating decreased muscular spasms and pain. Patient demonstrates increased increased lumbar flexion with performing bending motions. Patient demonstrates decreased pain at the end of the session and will benefit from further skilled therapy to prior level of function.     Rehab Potential  Fair    Clinical Impairments Affecting Rehab Potential  (+) highly motivated, (-) age, decreased strength    PT Frequency  2x / week    PT Duration  8 weeks    PT Treatment/Interventions  Passive range of motion;Manual techniques;Electrical Stimulation;Cryotherapy;Moist Heat;Ultrasound;Therapeutic exercise;Therapeutic activities;Neuromuscular re-education;Patient/family education;Scar mobilization;Dry needling;Iontophoresis 4mg /ml Dexamethasone    PT Next Visit Plan  progress AROM exercises    PT Home Exercise Plan  See education section    Consulted and Agree with Plan of Care  Patient       Patient will benefit from skilled therapeutic intervention in order to improve the following deficits and impairments:  Pain, Impaired sensation, Decreased coordination, Decreased mobility, Increased muscle spasms, Decreased activity tolerance, Decreased endurance, Decreased range of motion, Decreased strength  Visit Diagnosis: Chronic left shoulder pain  Muscle weakness (generalized)  Chronic right-sided low back pain without sciatica     Problem List There are no active problems to display for this patient.   Myrene GalasWesley Challen Spainhour, PT DPT 12/08/2017, 10:05 AM  [facility] [address] [place], [place],  [address] Phone: [phone]   Fax:  [phone]  Name: Suzanne Snyder MRN: [id number] Date of Birth: [birth year]

## 2017-12-13 ENCOUNTER — Ambulatory Visit: Payer: Medicare Other | Attending: Orthopedic Surgery | Admitting: Physical Therapy

## 2017-12-13 DIAGNOSIS — M25612 Stiffness of left shoulder, not elsewhere classified: Secondary | ICD-10-CM | POA: Insufficient documentation

## 2017-12-13 DIAGNOSIS — M6281 Muscle weakness (generalized): Secondary | ICD-10-CM | POA: Diagnosis present

## 2017-12-13 DIAGNOSIS — M25512 Pain in left shoulder: Secondary | ICD-10-CM | POA: Diagnosis not present

## 2017-12-13 DIAGNOSIS — G8929 Other chronic pain: Secondary | ICD-10-CM | POA: Diagnosis present

## 2017-12-13 NOTE — Therapy (Signed)
[facility] [address] [place], [place], [address] Phone: [phone]   Fax:  [phone]  Physical Therapy Treatment  Patient Details  Name: Suzanne Snyder MRN: [id number] Date of Birth: Jan 28, 1950 Referring Provider (PT): Olga MillersKamath MD   Encounter Date: 12/13/2017  PT End of Session - 12/13/17 1535    Visit Number  9    Number of Visits  13    Date for PT Re-Evaluation  12/20/17    Authorization Type  9/10 [employer]    PT Start Time  1305    PT Stop Time  1345    PT Time Calculation (min)  40 min    Activity Tolerance  Patient tolerated treatment well    Behavior During Therapy  [facility] for tasks assessed/performed       Past Medical History:  Diagnosis Date  . Anxiety   . Diabetes mellitus without complication (HCC)   . Osteoporosis     Past Surgical History:  Procedure Laterality Date  . 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 ROTATOR CUFF REPAIR Left 2014 and 2015  . SPINE SURGERY N/A 1984   L4-5, not sure of procedure    There were no vitals filed for this visit.  Subjective Assessment - 12/13/17 1305    Subjective  Patient states that at 1130A today her blood sugar was extremely low (28) and she drank and ate and was able to elevate it (137). Patient states that she is feeling better, but is still fatigued. She reports increased pain along the medial scapular border.     Pertinent History  Chronic history of L shoulder pain, sciolosis, laminectomy     Limitations  Lifting    Patient Stated Goals  To improve shoulder function and decrease pain    Currently in Pain?  Yes    Pain Score  8     Pain Location  Scapula    Pain Orientation  Left;Medial    Pain Descriptors / Indicators  Aching;Stabbing;Radiating    Pain Type  Chronic pain    Pain Radiating Towards  medial upper arm    Pain Onset  More than a month ago    Pain Frequency  Intermittent       TREATMENT Manual Intervention x10  min STM L periscapular area and UT Scapular mobilization during PROM (flexion and aBduction)  Therapeutic Exercises Shoulder flexion in sitting - x 10 on L side Shoulder abduction in sititng - x 10   Shoulder ER in sitting - x 10 on L side Stool L shoulder flexion x10  Stool L shoulder horizontal adduction/aBduction x10 L shoulder abduction End Range Lift-off 5 sec hold x10 Scapular retraction in standing - x 20 with 5 sec holds    PT Long Term Goals - 11/08/17 1419      PT LONG TERM GOAL #1   Title  Patient will improve AROM to 120 degrees left shoulder with mild discomfort for reaching into cabinet or performing hair care     Baseline  95 AROM in sitting; 09/14/2017: 105 deg; 10/28/2017: 115    Time  8    Period  Weeks    Status  On-going      PT LONG TERM GOAL #2   Title  Patient will improve all shoulder strength 4+/5 for flexion and rotation to improve ability to manipulate objects in space.     Baseline  3/5 - 3+/5 within all measured motions;  09/14/2017: 3+/5 with all motion; 10/28/2017: 3+/5 for rotational movement and flexion improvement with extension and adduction to 4-/5    Time  8    Period  Weeks    Status  On-going      PT LONG TERM GOAL #3   Title  Patient will  be independent with home program for strength and pain control for left UE/shoulder by 04/17/2015    Baseline  limited knowledge of pain control strategies or progression of exercises for left UE; 09/14/2017: Requires moderate cueing for exercise progression; 10/28/2017: Continues to require minimal cueing to correct    Time  8    Period  Weeks    Status  On-going      PT LONG TERM GOAL #4   Title  Patient will have a worst pain of a 3/10 to allow for performance of household activities without increased pain    Baseline  8/10    Time  8    Period  Weeks    Status  New      PT LONG TERM GOAL #5   Title  Patient will be able to stand for over 5 hours without increase in pain to allow for cooking for long  periods of time wihtout increase in pain    Baseline  Increased pain after 4 hours from standing     Time  8    Period  Weeks    Status  New            Plan - 12/13/17 1536    Clinical Impression Statement  Patient reports to clinic with increased complaints of pain, but had decreased pain after the session. Patient continues to have end range compensations at the L shoulder during AROM exercises, but is able to control with verbal and tactile cues. Patient will benefit from continued skilled therapeutic intervention to address deficits in LUE strength, ROM, motor control, and activity tolerance in order to improve overall QOL and maximize independence.    Rehab Potential  Fair    Clinical Impairments Affecting Rehab Potential  (+) highly motivated, (-) age, decreased strength    PT Frequency  2x / week    PT Duration  8 weeks    PT Treatment/Interventions  Passive range of motion;Manual techniques;Electrical Stimulation;Cryotherapy;Moist Heat;Ultrasound;Therapeutic exercise;Therapeutic activities;Neuromuscular re-education;Patient/family education;Scar mobilization;Dry needling;Iontophoresis 4mg /ml Dexamethasone    PT Next Visit Plan  progress AROM exercises    PT Home Exercise Plan  See education section    Consulted and Agree with Plan of Care  Patient       Patient will benefit from skilled therapeutic intervention in order to improve the following deficits and impairments:  Pain, Impaired sensation, Decreased coordination, Decreased mobility, Increased muscle spasms, Decreased activity tolerance, Decreased endurance, Decreased range of motion, Decreased strength  Visit Diagnosis: Chronic left shoulder pain  Muscle weakness (generalized)  Stiffness of left shoulder, not elsewhere classified     Problem List There are no active problems to display for this patient.  Sheria Lang PT, DPT [phone] 12/13/2017, 3:45 PM  [place] [facility] PHYSICAL  AND SPORTS MEDICINE [address], [place], [address] Phone: [phone]   Fax:  [phone]  Name: Suzanne Snyder MRN: [id number] Date of Birth: 07-Mar-1950

## 2017-12-16 ENCOUNTER — Ambulatory Visit: Payer: Medicare Other

## 2017-12-20 ENCOUNTER — Ambulatory Visit: Payer: Medicare Other

## 2017-12-20 DIAGNOSIS — M6281 Muscle weakness (generalized): Secondary | ICD-10-CM

## 2017-12-20 DIAGNOSIS — M25512 Pain in left shoulder: Secondary | ICD-10-CM | POA: Diagnosis not present

## 2017-12-20 DIAGNOSIS — G8929 Other chronic pain: Secondary | ICD-10-CM

## 2017-12-20 DIAGNOSIS — M25612 Stiffness of left shoulder, not elsewhere classified: Secondary | ICD-10-CM

## 2017-12-20 NOTE — Therapy (Signed)
[facility] PHYSICAL AND SPORTS MEDICINE [address] [place], [place], [address] Phone: [phone]   Fax:  [phone]  Physical Therapy Treatment  Patient Details  Name: Suzanne LeberDeborah L Baba MRN: [id number] Date of Birth: 1950/06/09 Referring Provider (PT): Olga MillersKamath MD   Encounter Date: 12/20/2017  PT End of Session - 12/20/17 1427    Visit Number  10    Number of Visits  13    Date for PT Re-Evaluation  12/20/17    Authorization Type  10/10 [employer] - Recert next visit    PT Start Time  1345    PT Stop Time  1430    PT Time Calculation (min)  45 min    Activity Tolerance  Patient tolerated treatment well    Behavior During Therapy  [facility]       Past Medical History:  Diagnosis Date  . Anxiety   . Diabetes mellitus without complication (HCC)   . Osteoporosis     Past Surgical History:  Procedure Laterality Date  . 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 ROTATOR CUFF REPAIR Left 2014 and 2015  . SPINE SURGERY N/A 1984   L4-5, not sure of procedure    There were no vitals filed for this visit.  Subjective Assessment - 12/20/17 1421    Subjective  Patient reports her shoulder has been hurting since Wednesday of last week. Patient states she had difficulty with raisng her arm overhead and increased pain with performance.     Pertinent History  Chronic history of L shoulder pain, sciolosis, laminectomy     Limitations  Lifting    Patient Stated Goals  To improve shoulder function and decrease pain    Currently in Pain?  Yes    Pain Score  8     Pain Location  Scapula    Pain Orientation  Left;Medial    Pain Descriptors / Indicators  Aching    Pain Type  Chronic pain    Pain Onset  More than a month ago    Pain Frequency  Intermittent          TREATMENT   Therapeutic Exercises Shoulder flexion in sitting - x 10 on L side Self mobilization with LAX ball - 5 min in standing  Shoulder abduction in  sititng - x 10   Overhead ball throws - x 20 Straight arm push downs with RTB - x 20 Ball circles in standing with 2# ball - x 20 Scapular retraction in sitting - x 20 with RTB Bicep curls in standing - x 10 with RTB Ball push outs physioball - x 20    Patient demonstrates increased fatigue at the end of the session    PT Education - 12/20/17 1425    Education Details  form/technique with exercise to return to prior level of function.     Person(s) Educated  Patient    Methods  Explanation;Demonstration    Comprehension  Verbalized understanding;Returned demonstration          PT Long Term Goals - 12/20/17 1428      PT LONG TERM GOAL #1   Title  Patient will improve AROM to 120 degrees left shoulder with mild discomfort for reaching into cabinet or performing hair care     Baseline  95 AROM in sitting; 09/14/2017: 105 deg; 10/28/2017: 115    Time  8    Period  Weeks    Status  Deferred  PT LONG TERM GOAL #2   Title  Patient will improve all shoulder strength 4+/5 for flexion and rotation to improve ability to manipulate objects in space.     Baseline  3/5 - 3+/5 within all measured motions; 09/14/2017: 3+/5 with all motion; 10/28/2017: 3+/5 for rotational movement and flexion improvement with extension and adduction to 4-/5    Time  8    Period  Weeks    Status  Deferred      PT LONG TERM GOAL #3   Title  Patient will  be independent with home program for strength and pain control for left UE/shoulder by 04/17/2015    Baseline  limited knowledge of pain control strategies or progression of exercises for left UE; 09/14/2017: Requires moderate cueing for exercise progression; 10/28/2017: Continues to require minimal cueing to correct    Time  8    Period  Weeks    Status  Deferred      PT LONG TERM GOAL #4   Title  Patient will have a worst pain of a 3/10 to allow for performance of household activities without increased pain    Baseline  8/10    Time  8    Period  Weeks     Status  Deferred      PT LONG TERM GOAL #5   Title  Patient will be able to stand for over 5 hours without increase in pain to allow for cooking for long periods of time wihtout increase in pain    Baseline  Increased pain after 4 hours from standing     Time  8    Period  Weeks    Status  Deferred            Plan - 12/20/17 1428    Clinical Impression Statement  Defferred reassessing goal to next visit today as patient had aggravation of symptoms today. Patient demosntrates increased pain but deomnstrates decreased pain after performing ball rolls aginst the wall. Patient will benefit from further skilled therapy to return to prior level of function.     Rehab Potential  Fair    Clinical Impairments Affecting Rehab Potential  (+) highly motivated, (-) age, decreased strength    PT Frequency  2x / week    PT Duration  8 weeks    PT Treatment/Interventions  Passive range of motion;Manual techniques;Electrical Stimulation;Cryotherapy;Moist Heat;Ultrasound;Therapeutic exercise;Therapeutic activities;Neuromuscular re-education;Patient/family education;Scar mobilization;Dry needling;Iontophoresis 4mg /ml Dexamethasone    PT Next Visit Plan  progress AROM exercises    PT Home Exercise Plan  See education section    Consulted and Agree with Plan of Care  Patient       Patient will benefit from skilled therapeutic intervention in order to improve the following deficits and impairments:  Pain, Impaired sensation, Decreased coordination, Decreased mobility, Increased muscle spasms, Decreased activity tolerance, Decreased endurance, Decreased range of motion, Decreased strength  Visit Diagnosis: Chronic left shoulder pain  Muscle weakness (generalized)  Stiffness of left shoulder, not elsewhere classified     Problem List There are no active problems to display for this patient.   [name], PT DPT 12/20/2017, 2:37 PM  [facility] [facility] PHYSICAL  AND SPORTS MEDICINE [address] [place], [address] Phone: [phone]   Fax:  [phone]  Name: CHARLESTINE ROOKSTOOL MRN: [id number] Date of Birth: 1950/06/17

## 2017-12-23 ENCOUNTER — Ambulatory Visit: Payer: Medicare Other

## 2017-12-23 DIAGNOSIS — M25512 Pain in left shoulder: Secondary | ICD-10-CM | POA: Diagnosis not present

## 2017-12-23 DIAGNOSIS — M6281 Muscle weakness (generalized): Secondary | ICD-10-CM

## 2017-12-23 DIAGNOSIS — G8929 Other chronic pain: Secondary | ICD-10-CM

## 2017-12-23 DIAGNOSIS — M25612 Stiffness of left shoulder, not elsewhere classified: Secondary | ICD-10-CM

## 2017-12-23 NOTE — Therapy (Signed)
[facility] PHYSICAL AND SPORTS MEDICINE [address] [place], [place], [address] Phone: [phone]   Fax:  [phone]  Physical Therapy Treatment  Patient Details  Name: Suzanne LeberDeborah L Miao MRN: [id number] Date of Birth: Jul 08, 1950 Referring Provider (PT): Olga MillersKamath MD   Encounter Date: 12/23/2017  PT End of Session - 12/23/17 1642    Visit Number  11    Number of Visits  13    Date for PT Re-Evaluation  12/20/17    Authorization Type  1/10 Medicare    PT Start Time  1415    PT Stop Time  1500    PT Time Calculation (min)  45 min    Activity Tolerance  Patient tolerated treatment well    Behavior During Therapy  [facility] for tasks assessed/performed       Past Medical History:  Diagnosis Date  . Anxiety   . Diabetes mellitus without complication (HCC)   . Osteoporosis     Past Surgical History:  Procedure Laterality Date  . 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 ROTATOR CUFF REPAIR Left 2014 and 2015  . SPINE SURGERY N/A 1984   L4-5, not sure of procedure    There were no vitals filed for this visit.  Subjective Assessment - 12/23/17 1638    Subjective  Patient reports her shoulder has been hurting her more today although not as bad as it did previously. Patient reports she continues to have difficulty with performing shoulder flexion in standing.     Pertinent History  Chronic history of L shoulder pain, sciolosis, laminectomy     Limitations  Lifting    Patient Stated Goals  To improve shoulder function and decrease pain    Currently in Pain?  Yes    Pain Score  8     Pain Location  Scapula    Pain Orientation  Left;Medial    Pain Descriptors / Indicators  Aching    Pain Type  Chronic pain    Pain Onset  More than a month ago    Pain Frequency  Intermittent        TREATMENT   Therapeutic Exercises Shoulder flexion in sitting - x 10 on L side Scapular retraction in sitting - x 16 Cervical retraction in sitting - x 20   Cervical retraction with scapular retraction - x 20 Thoracic rotation in sitting - x 20   Manual therapy: STM performed to periscapular muscles on the L side, patient demonstrates decreased pain after performing superficial techniques performed for 10min in sitting   Patient demonstrates increased fatigue at the end of the session   PT Education - 12/23/17 1641    Education Details  form/technique with exercise performance    Person(s) Educated  Patient    Methods  Explanation;Demonstration    Comprehension  Verbalized understanding;Returned demonstration          PT Long Term Goals - 12/23/17 1430      PT LONG TERM GOAL #1   Title  Patient will improve AROM to 120 degrees left shoulder with mild discomfort for reaching into cabinet or performing hair care     Baseline  95 AROM in sitting; 09/14/2017: 105 deg; 10/28/2017: 115; 12/23/2017: 120    Time  8    Period  Weeks    Status  Achieved      PT LONG TERM GOAL #2   Title  Patient will improve all shoulder strength 4+/5 for flexion  and rotation to improve ability to manipulate objects in space.     Baseline  3/5 - 3+/5 within all measured motions; 09/14/2017: 3+/5 with all motion; 10/28/2017: 3+/5 for rotational movement and flexion improvement with extension and adduction to 4-/5; 4-/5 to 3+/5 for all shoulder movements    Time  8    Period  Weeks    Status  On-going      PT LONG TERM GOAL #3   Title  Patient will  be independent with home program for strength and pain control for left UE/shoulder by 04/17/2015    Baseline  limited knowledge of pain control strategies or progression of exercises for left UE; 09/14/2017: Requires moderate cueing for exercise progression; 10/28/2017: Continues to require minimal cueing to correct; 12/23/2017: continues to require moderate cueing for exercise performance    Time  8    Period  Weeks    Status  On-going      PT LONG TERM GOAL #4   Title  Patient will have a worst pain of a 3/10 to  allow for performance of household activities without increased pain    Baseline  8/10; 12/23/2017: 8/10    Time  8    Period  Weeks    Status  On-going      PT LONG TERM GOAL #5   Title  Patient will be able to stand for over 5 hours without increase in pain to allow for cooking for long periods of time wihtout increase in pain    Baseline  Increased pain after 4 hours from standing; 12/23/2017: Able to perform 5 hours of cooking with increased pain    Time  8    Period  Weeks    Status  On-going            Plan - 12/23/17 1642    Clinical Impression Statement  Patient is making progress towards long term goals with ability to perform greater AROM compared to previous treatment sessions. Although patient is improving, she continues to have increased pain with performing overhead motions and decreased strength most likely secondary to muscular guarding secondary to pain. Patient demonstrates decreased shoulder pain after performing mobility based exercises and patient will benefit from further skilled therapy to return to prior level of function.     Rehab Potential  Fair    Clinical Impairments Affecting Rehab Potential  (+) highly motivated, (-) age, decreased strength    PT Frequency  2x / week    PT Duration  8 weeks    PT Treatment/Interventions  Passive range of motion;Manual techniques;Electrical Stimulation;Cryotherapy;Moist Heat;Ultrasound;Therapeutic exercise;Therapeutic activities;Neuromuscular re-education;Patient/family education;Scar mobilization;Dry needling;Iontophoresis 4mg /ml Dexamethasone    PT Next Visit Plan  progress AROM exercises    PT Home Exercise Plan  See education section    Consulted and Agree with Plan of Care  Patient       Patient will benefit from skilled therapeutic intervention in order to improve the following deficits and impairments:  Pain, Impaired sensation, Decreased coordination, Decreased mobility, Increased muscle spasms, Decreased  activity tolerance, Decreased endurance, Decreased range of motion, Decreased strength  Visit Diagnosis: Chronic left shoulder pain  Muscle weakness (generalized)  Stiffness of left shoulder, not elsewhere classified     Problem List There are no active problems to display for this patient.   [name], PT DPT 12/23/2017, 4:49 PM  [place] [facility] PHYSICAL AND SPORTS MEDICINE [address], [place], [address] Phone: [phone]  Fax:  [phone]  Name: EMMALEA TREANOR MRN: [id number] Date of Birth: 1950/10/12

## 2017-12-27 ENCOUNTER — Ambulatory Visit: Payer: Medicare Other

## 2017-12-27 DIAGNOSIS — M25512 Pain in left shoulder: Secondary | ICD-10-CM | POA: Diagnosis not present

## 2017-12-27 DIAGNOSIS — M25612 Stiffness of left shoulder, not elsewhere classified: Secondary | ICD-10-CM

## 2017-12-27 DIAGNOSIS — M6281 Muscle weakness (generalized): Secondary | ICD-10-CM

## 2017-12-27 DIAGNOSIS — G8929 Other chronic pain: Secondary | ICD-10-CM

## 2017-12-27 NOTE — Therapy (Signed)
[facility] PHYSICAL AND SPORTS MEDICINE [address] [place], [address] Phone: [phone]   Fax:  [phone]  Physical Therapy Treatment  Patient Details  Name: Suzanne Snyder MRN: [id number] Date of Birth: [birth year] Referring Provider (PT): Olga Millers MD   Encounter Date: 12/27/2017  PT End of Session - 12/27/17 1325    Visit Number  13    Number of Visits  21    Date for PT Re-Evaluation  02/03/18    Authorization Type  2/10 Medicare    PT Start Time  1300    PT Stop Time  1345    PT Time Calculation (min)  45 min    Activity Tolerance  Patient tolerated treatment well    Behavior During Therapy  [facility] for tasks assessed/performed       Past Medical History:  Diagnosis Date  . Anxiety   . Diabetes mellitus without complication (HCC)   . Osteoporosis     Past Surgical History:  Procedure Laterality Date  . 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 ROTATOR CUFF REPAIR Left 2014 and 2015  . SPINE SURGERY N/A 1984   L4-5, not sure of procedure    There were no vitals filed for this visit.  Subjective Assessment - 12/27/17 1307    Subjective  Patient reports her shoulder has been feeling better and had a good weekend. Patient states no aggrvation of symptoms.     Pertinent History  Chronic history of L shoulder pain, sciolosis, laminectomy     Limitations  Lifting    Patient Stated Goals  To improve shoulder function and decrease pain    Currently in Pain?  Yes    Pain Score  3     Pain Location  Shoulder    Pain Orientation  Left;Medial    Pain Descriptors / Indicators  Aching    Pain Type  Chronic pain    Pain Onset  More than a month ago    Pain Frequency  Intermittent       TREATMENT Therapeutic Exercise Bridges with overhead shoulder flexion with 3# on a pipe - 2 x 15 Standing shoulder Low row with GTB - x 20  Rotational chops GTB - x 20  Ball roll outs in sitting - x 20  Squats in standing with UE  support - x 20  Standing shoulder flexion with PVC pipe - x 12 Patient demonstrates no increase in pain throughout session   PT Education - 12/27/17 1324    Education Details  form/technique with exercise performance     Person(s) Educated  Patient    Methods  Demonstration;Explanation    Comprehension  Verbalized understanding;Returned demonstration          PT Long Term Goals - 12/23/17 1430      PT LONG TERM GOAL #1   Title  Patient will improve AROM to 120 degrees left shoulder with mild discomfort for reaching into cabinet or performing hair care     Baseline  95 AROM in sitting; 09/14/2017: 105 deg; 10/28/2017: 115; 12/23/2017: 120    Time  8    Period  Weeks    Status  Achieved      PT LONG TERM GOAL #2   Title  Patient will improve all shoulder strength 4+/5 for flexion and rotation to improve ability to manipulate objects in space.     Baseline  3/5 - 3+/5 within all measured motions; 09/14/2017: 3+/5  with all motion; 10/28/2017: 3+/5 for rotational movement and flexion improvement with extension and adduction to 4-/5; 4-/5 to 3+/5 for all shoulder movements    Time  8    Period  Weeks    Status  On-going      PT LONG TERM GOAL #3   Title  Patient will  be independent with home program for strength and pain control for left UE/shoulder by 04/17/2015    Baseline  limited knowledge of pain control strategies or progression of exercises for left UE; 09/14/2017: Requires moderate cueing for exercise progression; 10/28/2017: Continues to require minimal cueing to correct; 12/23/2017: continues to require moderate cueing for exercise performance    Time  8    Period  Weeks    Status  On-going      PT LONG TERM GOAL #4   Title  Patient will have a worst pain of a 3/10 to allow for performance of household activities without increased pain    Baseline  8/10; 12/23/2017: 8/10    Time  8    Period  Weeks    Status  On-going      PT LONG TERM GOAL #5   Title  Patient will be able  to stand for over 5 hours without increase in pain to allow for cooking for long periods of time wihtout increase in pain    Baseline  Increased pain after 4 hours from standing; 12/23/2017: Able to perform 5 hours of cooking with increased pain    Time  8    Period  Weeks    Status  On-going            Plan - 12/27/17 1334    Clinical Impression Statement  Patient demonstrates improvement with exercises with ability to perform higher level exercises compared to previous sessions indicating functional carryover between sessions. Although patient is improving, she continues to have increased pain and spasms with prolonged use indicating poor muscular endurance. Patient will benefit from further skilled therapy to return to prior level of function.     Rehab Potential  Fair    Clinical Impairments Affecting Rehab Potential  (+) highly motivated, (-) age, decreased strength    PT Frequency  2x / week    PT Duration  8 weeks    PT Treatment/Interventions  Passive range of motion;Manual techniques;Electrical Stimulation;Cryotherapy;Moist Heat;Ultrasound;Therapeutic exercise;Therapeutic activities;Neuromuscular re-education;Patient/family education;Scar mobilization;Dry needling;Iontophoresis 4mg /ml Dexamethasone    PT Next Visit Plan  progress AROM exercises    PT Home Exercise Plan  See education section    Consulted and Agree with Plan of Care  Patient       Patient will benefit from skilled therapeutic intervention in order to improve the following deficits and impairments:  Pain, Impaired sensation, Decreased coordination, Decreased mobility, Increased muscle spasms, Decreased activity tolerance, Decreased endurance, Decreased range of motion, Decreased strength  Visit Diagnosis: Chronic left shoulder pain  Muscle weakness (generalized)  Stiffness of left shoulder, not elsewhere classified     Problem List There are no active problems to display for this patient.   Myrene GalasWesley  Chanon Loney, PT DPT 12/27/2017, 1:45 PM  [facility] PHYSICAL AND SPORTS MEDICINE [address] [place], [place], [address] Phone: [phone]   Fax:  [phone]  Name: Suzanne Snyder MRN: [id number] Date of Birth: 1950-04-20

## 2017-12-27 NOTE — Addendum Note (Signed)
Addended by: Bethanie DickerISSELL, Britt V on: 12/27/2017 01:29 PM   Modules accepted: Orders

## 2017-12-29 ENCOUNTER — Ambulatory Visit: Payer: Medicare Other

## 2017-12-29 DIAGNOSIS — M25512 Pain in left shoulder: Secondary | ICD-10-CM | POA: Diagnosis not present

## 2017-12-29 DIAGNOSIS — M25612 Stiffness of left shoulder, not elsewhere classified: Secondary | ICD-10-CM

## 2017-12-29 DIAGNOSIS — M6281 Muscle weakness (generalized): Secondary | ICD-10-CM

## 2017-12-29 DIAGNOSIS — G8929 Other chronic pain: Secondary | ICD-10-CM

## 2017-12-29 NOTE — Therapy (Signed)
[facility] PHYS[facility] AND [facility] MEDICINE [address] [place], [place], [address] Phone: [phone]   Fax:  [phone]  Physical Therapy Treatment  Patient Details  Name: Suzanne LeberDeborah L Maricle MRN: [id number] Date of Birth: [birth year] Referring Provider (PT): Olga MillersKamath MD   Encounter Date: 12/29/2017  PT End of Session - 12/29/17 0952    Visit Number  14    Number of Visits  21    Date for PT Re-Evaluation  02/03/18    Authorization Type  3/10 Medicare    PT Start Time  0903    PT Stop Time  0945    PT Time Calculation (min)  42 min    Activity Tolerance  Patient tolerated treatment well    Behavior During Therapy  [facility] for tasks assessed/performed       Past Medical History:  Diagnosis Date  . Anxiety   . Diabetes mellitus without complication (HCC)   . Osteoporosis     Past Surgical History:  Procedure Laterality Date  . 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 ROTATOR CUFF REPAIR Left 2014 and 2015  . SPINE SURGERY N/A 1984   L4-5, not sure of procedure    There were no vitals filed for this visit.  Subjective Assessment - 12/29/17 0938    Subjective  Patient reports her shoulder has hurting last night and states she was stressed from her daughter being home. Patient states increased pain today.     Pertinent History  Chronic history of L shoulder pain, sciolosis, laminectomy     Limitations  Lifting    Patient Stated Goals  To improve shoulder function and decrease pain    Currently in Pain?  Yes    Pain Score  6     Pain Location  Shoulder    Pain Orientation  Left    Pain Descriptors / Indicators  Aching    Pain Type  Chronic pain    Pain Onset  More than a month ago    Pain Frequency  Intermittent       TREATMENT Therapeutic Exercise Bridges with overhead shoulder flexion- 2 x 15 Archery in standing - x15 Shoulder at side shoulder abduction with RTB - x 20; x8 with squats Standing shoulder Low row with Black TB -  x 20  Overhead shoulder adduction with RTB - x 20  Shoulder Extension in standing - x 20   Patient demonstrates no increase in pain throughout session   PT Education - 12/29/17 0952    Education Details  form/technique with exercise    Person(s) Educated  Patient    Methods  Explanation;Demonstration    Comprehension  Verbalized understanding;Returned demonstration          PT Long Term Goals - 12/23/17 1430      PT LONG TERM GOAL #1   Title  Patient will improve AROM to 120 degrees left shoulder with mild discomfort for reaching into cabinet or performing hair care     Baseline  95 AROM in sitting; 09/14/2017: 105 deg; 10/28/2017: 115; 12/23/2017: 120    Time  8    Period  Weeks    Status  Achieved      PT LONG TERM GOAL #2   Title  Patient will improve all shoulder strength 4+/5 for flexion and rotation to improve ability to manipulate objects in space.     Baseline  3/5 - 3+/5 within all measured motions; 09/14/2017: 3+/5 with all  motion; 10/28/2017: 3+/5 for rotational movement and flexion improvement with extension and adduction to 4-/5; 4-/5 to 3+/5 for all shoulder movements    Time  8    Period  Weeks    Status  On-going      PT LONG TERM GOAL #3   Title  Patient will  be independent with home program for strength and pain control for left UE/shoulder by 04/17/2015    Baseline  limited knowledge of pain control strategies or progression of exercises for left UE; 09/14/2017: Requires moderate cueing for exercise progression; 10/28/2017: Continues to require minimal cueing to correct; 12/23/2017: continues to require moderate cueing for exercise performance    Time  8    Period  Weeks    Status  On-going      PT LONG TERM GOAL #4   Title  Patient will have a worst pain of a 3/10 to allow for performance of household activities without increased pain    Baseline  8/10; 12/23/2017: 8/10    Time  8    Period  Weeks    Status  On-going      PT LONG TERM GOAL #5   Title   Patient will be able to stand for over 5 hours without increase in pain to allow for cooking for long periods of time wihtout increase in pain    Baseline  Increased pain after 4 hours from standing; 12/23/2017: Able to perform 5 hours of cooking with increased pain    Time  8    Period  Weeks    Status  On-going            Plan - 12/29/17 1051    Clinical Impression Statement  Patient demonstrates incresaed difficulty with performing movements in the coronel plane. Patient continues to have difficulty with performing exercises in standing, patient continues to demosntrate aberrant movement in standing. Patient will benefit from further skilled therapy to return to prior level of function.     Rehab Potential  Fair    Clinical Impairments Affecting Rehab Potential  (+) highly motivated, (-) age, decreased strength    PT Frequency  2x / week    PT Duration  8 weeks    PT Treatment/Interventions  Passive range of motion;Manual techniques;Electrical Stimulation;Cryotherapy;Moist Heat;Ultrasound;Therapeutic exercise;Therapeutic activities;Neuromuscular re-education;Patient/family education;Scar mobilization;Dry needling;Iontophoresis 4mg /ml Dexamethasone    PT Next Visit Plan  progress AROM exercises    PT Home Exercise Plan  See education section    Consulted and Agree with Plan of Care  Patient       Patient will benefit from skilled therapeutic intervention in order to improve the following deficits and impairments:  Pain, Impaired sensation, Decreased coordination, Decreased mobility, Increased muscle spasms, Decreased activity tolerance, Decreased endurance, Decreased range of motion, Decreased strength  Visit Diagnosis: Chronic left shoulder pain  Muscle weakness (generalized)  Stiffness of left shoulder, not elsewhere classified     Problem List There are no active problems to display for this patient.   [name], PT DPT 12/29/2017, 10:57 AM  [facility] PHYSICAL AND SPORTS MEDICINE [address], [place], [address] Phone: [phone]   Fax:  [phone]  Name: CHISOM MUNTEAN MRN: [id number] Date of Birth: 1950-02-28

## 2018-01-11 ENCOUNTER — Ambulatory Visit: Payer: Medicare Other

## 2018-01-13 ENCOUNTER — Ambulatory Visit: Payer: Medicare Other | Attending: Orthopedic Surgery

## 2018-01-13 DIAGNOSIS — M25512 Pain in left shoulder: Secondary | ICD-10-CM | POA: Diagnosis present

## 2018-01-13 DIAGNOSIS — M6281 Muscle weakness (generalized): Secondary | ICD-10-CM | POA: Diagnosis present

## 2018-01-13 DIAGNOSIS — M25612 Stiffness of left shoulder, not elsewhere classified: Secondary | ICD-10-CM | POA: Insufficient documentation

## 2018-01-13 DIAGNOSIS — M545 Low back pain: Secondary | ICD-10-CM | POA: Diagnosis present

## 2018-01-13 DIAGNOSIS — G8929 Other chronic pain: Secondary | ICD-10-CM | POA: Insufficient documentation

## 2018-01-13 NOTE — Therapy (Signed)
[facility] [address] [place], [address] Phone: [phone]   Fax:  [phone]  Physical Therapy Treatment  Patient Details  Name: Suzanne Snyder MRN: [id number] Date of Birth: March 15, 1950 Referring Provider (PT): Olga Millers MD   Encounter Date: 01/13/2018  PT End of Session - 01/13/18 1004    Visit Number  15    Number of Visits  21    Date for PT Re-Evaluation  02/03/18    Authorization Type  4/10 Medicare    PT Start Time  0945    PT Stop Time  1030    PT Time Calculation (min)  45 min    Activity Tolerance  Patient tolerated treatment well    Behavior During Therapy  [facility] for tasks assessed/performed       Past Medical History:  Diagnosis Date  . Anxiety   . Diabetes mellitus without complication (HCC)   . Osteoporosis     Past Surgical History:  Procedure Laterality Date  . 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 ROTATOR CUFF REPAIR Left 2014 and 2015  . SPINE SURGERY N/A 1984   L4-5, not sure of procedure    There were no vitals filed for this visit.  Subjective Assessment - 01/13/18 0959    Subjective  Patient reports her blood sugar has been irrattic the past 2 weeks. Patient reports she feels this is from stress. Patient took blood sugar during session and it delivers a reading of 200.     Pertinent History  Chronic history of L shoulder pain, sciolosis, laminectomy     Limitations  Lifting    Patient Stated Goals  To improve shoulder function and decrease pain    Currently in Pain?  Yes    Pain Score  6     Pain Location  Shoulder    Pain Orientation  Left    Pain Descriptors / Indicators  Aching    Pain Type  Chronic pain    Pain Onset  More than a month ago    Pain Frequency  Intermittent       TREATMENT Therapeutic Exercise Bridges with overhead shoulder flexion-  x 15 Standing squats - x 20 Hip abduction with GTB around knees - x 20 B Hip extension with GTB around  knees - x 20 B Hip circles in standing - 2 x 10 circles B  with GTB  Low rows in standing -- x 20 Low lumbar rotations in standing -- x 20    Patient demonstrates no increase in pain throughout session    PT Education - 01/13/18 1002    Education Details  form/technique with exercise    Person(s) Educated  Patient    Methods  Explanation;Demonstration    Comprehension  Verbalized understanding;Returned demonstration          PT Long Term Goals - 12/23/17 1430      PT LONG TERM GOAL #1   Title  Patient will improve AROM to 120 degrees left shoulder with mild discomfort for reaching into cabinet or performing hair care     Baseline  95 AROM in sitting; 09/14/2017: 105 deg; 10/28/2017: 115; 12/23/2017: 120    Time  8    Period  Weeks    Status  Achieved      PT LONG TERM GOAL #2   Title  Patient will improve all shoulder strength 4+/5 for flexion and rotation to improve ability to  manipulate objects in space.     Baseline  3/5 - 3+/5 within all measured motions; 09/14/2017: 3+/5 with all motion; 10/28/2017: 3+/5 for rotational movement and flexion improvement with extension and adduction to 4-/5; 4-/5 to 3+/5 for all shoulder movements    Time  8    Period  Weeks    Status  On-going      PT LONG TERM GOAL #3   Title  Patient will  be independent with home program for strength and pain control for left UE/shoulder by 04/17/2015    Baseline  limited knowledge of pain control strategies or progression of exercises for left UE; 09/14/2017: Requires moderate cueing for exercise progression; 10/28/2017: Continues to require minimal cueing to correct; 12/23/2017: continues to require moderate cueing for exercise performance    Time  8    Period  Weeks    Status  On-going      PT LONG TERM GOAL #4   Title  Patient will have a worst pain of a 3/10 to allow for performance of household activities without increased pain    Baseline  8/10; 12/23/2017: 8/10    Time  8    Period  Weeks     Status  On-going      PT LONG TERM GOAL #5   Title  Patient will be able to stand for over 5 hours without increase in pain to allow for cooking for long periods of time wihtout increase in pain    Baseline  Increased pain after 4 hours from standing; 12/23/2017: Able to perform 5 hours of cooking with increased pain    Time  8    Period  Weeks    Status  On-going            Plan - 01/13/18 1010    Clinical Impression Statement  Performed shortened session today secondary to patient requiring to take her blood sugar at beginning of session. Patient demonstrates slow movement with exercise today and states increased pain with end range movement. Patient demosntrates ability to perform exercises with greater resistance compared to previous sessions.     Rehab Potential  Fair    Clinical Impairments Affecting Rehab Potential  (+) highly motivated, (-) age, decreased strength    PT Frequency  2x / week    PT Duration  8 weeks    PT Treatment/Interventions  Passive range of motion;Manual techniques;Electrical Stimulation;Cryotherapy;Moist Heat;Ultrasound;Therapeutic exercise;Therapeutic activities;Neuromuscular re-education;Patient/family education;Scar mobilization;Dry needling;Iontophoresis 4mg /ml Dexamethasone    PT Next Visit Plan  progress AROM exercises    PT Home Exercise Plan  See education section    Consulted and Agree with Plan of Care  Patient       Patient will benefit from skilled therapeutic intervention in order to improve the following deficits and impairments:  Pain, Impaired sensation, Decreased coordination, Decreased mobility, Increased muscle spasms, Decreased activity tolerance, Decreased endurance, Decreased range of motion, Decreased strength  Visit Diagnosis: Chronic left shoulder pain  Muscle weakness (generalized)     Problem List There are no active problems to display for this patient.   Gerri SporeWesley Kayvion Arneson 01/13/2018, 10:23 AM  [facility] [address] [place], [place], [address] Phone: [phone]   Fax:  [phone]  Name: Suzanne Snyder MRN: [id number] Date of Birth: [birth year]

## 2018-01-17 ENCOUNTER — Ambulatory Visit: Payer: Medicare Other

## 2018-01-17 DIAGNOSIS — M25512 Pain in left shoulder: Secondary | ICD-10-CM | POA: Diagnosis not present

## 2018-01-17 DIAGNOSIS — G8929 Other chronic pain: Secondary | ICD-10-CM

## 2018-01-17 DIAGNOSIS — M6281 Muscle weakness (generalized): Secondary | ICD-10-CM

## 2018-01-17 NOTE — Therapy (Signed)
[place] [facility] [address], [place], [address] Phone: [phone]   Fax:  [phone]  Physical Therapy Treatment  Patient Details  Name: Suzanne Snyder MRN: [id number] Date of Birth: 1951-01-09 Referring Provider (PT): Olga Millers MD   Encounter Date: 01/17/2018  PT End of Session - 01/17/18 1005    Visit Number  16    Number of Visits  21    Date for PT Re-Evaluation  02/03/18    Authorization Type  5/10 Medicare    PT Start Time  0952    PT Stop Time  1030    PT Time Calculation (min)  38 min    Activity Tolerance  Patient tolerated treatment well    Behavior During Therapy  [facility] for tasks assessed/performed       Past Medical History:  Diagnosis Date  . Anxiety   . Diabetes mellitus without complication (HCC)   . Osteoporosis     Past Surgical History:  Procedure Laterality Date  . 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 ROTATOR CUFF REPAIR Left 2014 and 2015  . SPINE SURGERY N/A 1984   L4-5, not sure of procedure    There were no vitals filed for this visit.  Subjective Assessment - 01/17/18 1000    Subjective  Patient reports increased pain in B shoulders when lifting arms overhead. Patient states she irratated her shoulders after lifting a heavy item.     Pertinent History  Chronic history of L shoulder pain, sciolosis, laminectomy     Limitations  Lifting    Patient Stated Goals  To improve shoulder function and decrease pain    Currently in Pain?  Yes    Pain Score  5     Pain Location  Shoulder    Pain Orientation  Left    Pain Descriptors / Indicators  Aching    Pain Type  Chronic pain    Pain Onset  More than a month ago    Pain Frequency  Intermittent       TREATMENT Therapeutic Exercise Shoulder flexion with PVC pipe - x20 Shoulder flexion with PVC - x 20 3#, 2# -- x 20  Shoulder flexion with PVC in hooklying - x 20  Shoulder high rows at OMEGA - x 20 15# Standing rows  at OMEGA - x 20 15# Standing squats with B UE support - x 20  Sitting ball roll outs in sitting - x 20  Patient demonstrates improvement with exercise   PT Education - 01/17/18 1004    Education Details  form/technique with exercise    Person(s) Educated  Patient    Methods  Explanation;Demonstration    Comprehension  Verbalized understanding;Returned demonstration          PT Long Term Goals - 12/23/17 1430      PT LONG TERM GOAL #1   Title  Patient will improve AROM to 120 degrees left shoulder with mild discomfort for reaching into cabinet or performing hair care     Baseline  95 AROM in sitting; 09/14/2017: 105 deg; 10/28/2017: 115; 12/23/2017: 120    Time  8    Period  Weeks    Status  Achieved      PT LONG TERM GOAL #2   Title  Patient will improve all shoulder strength 4+/5 for flexion and rotation to improve ability to manipulate objects in space.     Baseline  3/5 - 3+/5  within all measured motions; 09/14/2017: 3+/5 with all motion; 10/28/2017: 3+/5 for rotational movement and flexion improvement with extension and adduction to 4-/5; 4-/5 to 3+/5 for all shoulder movements    Time  8    Period  Weeks    Status  On-going      PT LONG TERM GOAL #3   Title  Patient will  be independent with home program for strength and pain control for left UE/shoulder by 04/17/2015    Baseline  limited knowledge of pain control strategies or progression of exercises for left UE; 09/14/2017: Requires moderate cueing for exercise progression; 10/28/2017: Continues to require minimal cueing to correct; 12/23/2017: continues to require moderate cueing for exercise performance    Time  8    Period  Weeks    Status  On-going      PT LONG TERM GOAL #4   Title  Patient will have a worst pain of a 3/10 to allow for performance of household activities without increased pain    Baseline  8/10; 12/23/2017: 8/10    Time  8    Period  Weeks    Status  On-going      PT LONG TERM GOAL #5   Title   Patient will be able to stand for over 5 hours without increase in pain to allow for cooking for long periods of time wihtout increase in pain    Baseline  Increased pain after 4 hours from standing; 12/23/2017: Able to perform 5 hours of cooking with increased pain    Time  8    Period  Weeks    Status  On-going            Plan - 01/17/18 1336    Clinical Impression Statement  Alternated shoulder and lumbar exercises today as patient demonstrates increased pain with movement with the shoulder today. Patient demonstrates increased muscular spasms and guarding along the affected shoulder which is improved after performing exercises. Educated patient on importance of mobility based exercises to decrease pain. Patient will benefit from further skilled therapy to return to prior level of function.     Rehab Potential  Fair    Clinical Impairments Affecting Rehab Potential  (+) highly motivated, (-) age, decreased strength    PT Frequency  2x / week    PT Duration  8 weeks    PT Treatment/Interventions  Passive range of motion;Manual techniques;Electrical Stimulation;Cryotherapy;Moist Heat;Ultrasound;Therapeutic exercise;Therapeutic activities;Neuromuscular re-education;Patient/family education;Scar mobilization;Dry needling;Iontophoresis 4mg /ml Dexamethasone    PT Next Visit Plan  progress AROM exercises    PT Home Exercise Plan  See education section    Consulted and Agree with Plan of Care  Patient       Patient will benefit from skilled therapeutic intervention in order to improve the following deficits and impairments:  Pain, Impaired sensation, Decreased coordination, Decreased mobility, Increased muscle spasms, Decreased activity tolerance, Decreased endurance, Decreased range of motion, Decreased strength  Visit Diagnosis: Chronic left shoulder pain  Muscle weakness (generalized)     Problem List There are no active problems to display for this patient.   [name], PT  DPT 01/17/2018, 1:48 PM  [facility] [facility] PHYSICAL AND SPORTS MEDICINE [address] [place], [address] Phone: [phone]   Fax:  [phone]  Name: ELMIRE STANBRO MRN: [id number] Date of Birth: August 24, 1950

## 2018-01-19 ENCOUNTER — Ambulatory Visit: Payer: Medicare Other

## 2018-01-19 ENCOUNTER — Emergency Department
Admission: EM | Admit: 2018-01-19 | Discharge: 2018-01-20 | Disposition: A | Discharge status: Home / Self Care | Payer: Medicare Other | Attending: Emergency Medicine | Admitting: Emergency Medicine

## 2018-01-19 ENCOUNTER — Encounter: Payer: Self-pay | Admitting: Emergency Medicine

## 2018-01-19 ENCOUNTER — Emergency Department: Payer: Medicare Other

## 2018-01-19 ENCOUNTER — Other Ambulatory Visit: Payer: Self-pay

## 2018-01-19 DIAGNOSIS — Z96642 Presence of left artificial hip joint: Secondary | ICD-10-CM | POA: Insufficient documentation

## 2018-01-19 DIAGNOSIS — E119 Type 2 diabetes mellitus without complications: Secondary | ICD-10-CM | POA: Diagnosis not present

## 2018-01-19 DIAGNOSIS — F17210 Nicotine dependence, cigarettes, uncomplicated: Secondary | ICD-10-CM | POA: Insufficient documentation

## 2018-01-19 DIAGNOSIS — L03115 Cellulitis of right lower limb: Secondary | ICD-10-CM | POA: Insufficient documentation

## 2018-01-19 DIAGNOSIS — R05 Cough: Secondary | ICD-10-CM | POA: Diagnosis not present

## 2018-01-19 DIAGNOSIS — R21 Rash and other nonspecific skin eruption: Secondary | ICD-10-CM | POA: Diagnosis present

## 2018-01-19 LAB — BASIC METABOLIC PANEL
Anion gap: 6 (ref 5–15)
BUN: 18 mg/dL (ref 8–23)
CALCIUM: 10.3 mg/dL (ref 8.9–10.3)
CO2: 21 mmol/L — ABNORMAL LOW (ref 22–32)
Chloride: 108 mmol/L (ref 98–111)
Creatinine, Ser: 0.65 mg/dL (ref 0.44–1.00)
GFR calc Af Amer: >60 mL/min (ref >60)
Glucose, Bld: 138 mg/dL — ABNORMAL HIGH (ref 70–99)
POTASSIUM: 3.8 mmol/L (ref 3.5–5.1)
Sodium: 135 mmol/L (ref 135–145)

## 2018-01-19 LAB — CBC
HEMATOCRIT: 37.8 % (ref 36.0–46.0)
Hemoglobin: 12.2 g/dL (ref 12.0–15.0)
MCH: 30.8 pg (ref 26.0–34.0)
MCHC: 32.3 g/dL (ref 30.0–36.0)
MCV: 95.5 fL (ref 80.0–100.0)
Platelets: 259 10*3/uL (ref 150–400)
RBC: 3.96 MIL/uL (ref 3.87–5.11)
RDW: 14.4 % (ref 11.5–15.5)
WBC: 6.9 10*3/uL (ref 4.0–10.5)
nRBC: 0 % (ref 0.0–0.2)

## 2018-01-19 LAB — TROPONIN I: Troponin I: <0.03 ng/mL (ref <0.03)

## 2018-01-19 IMAGING — CR DG CHEST 2V
1 series · 2 of 2 positions shown · non-contrast
Comparison: None.

CLINICAL DATA: Chest pain, increased shortness of breath.

EXAM:
CHEST - 2 VIEW

[Series 1: dg chest 2 view · 0.14mm/px · 2 of 2 slices shown]
[im 1/2]
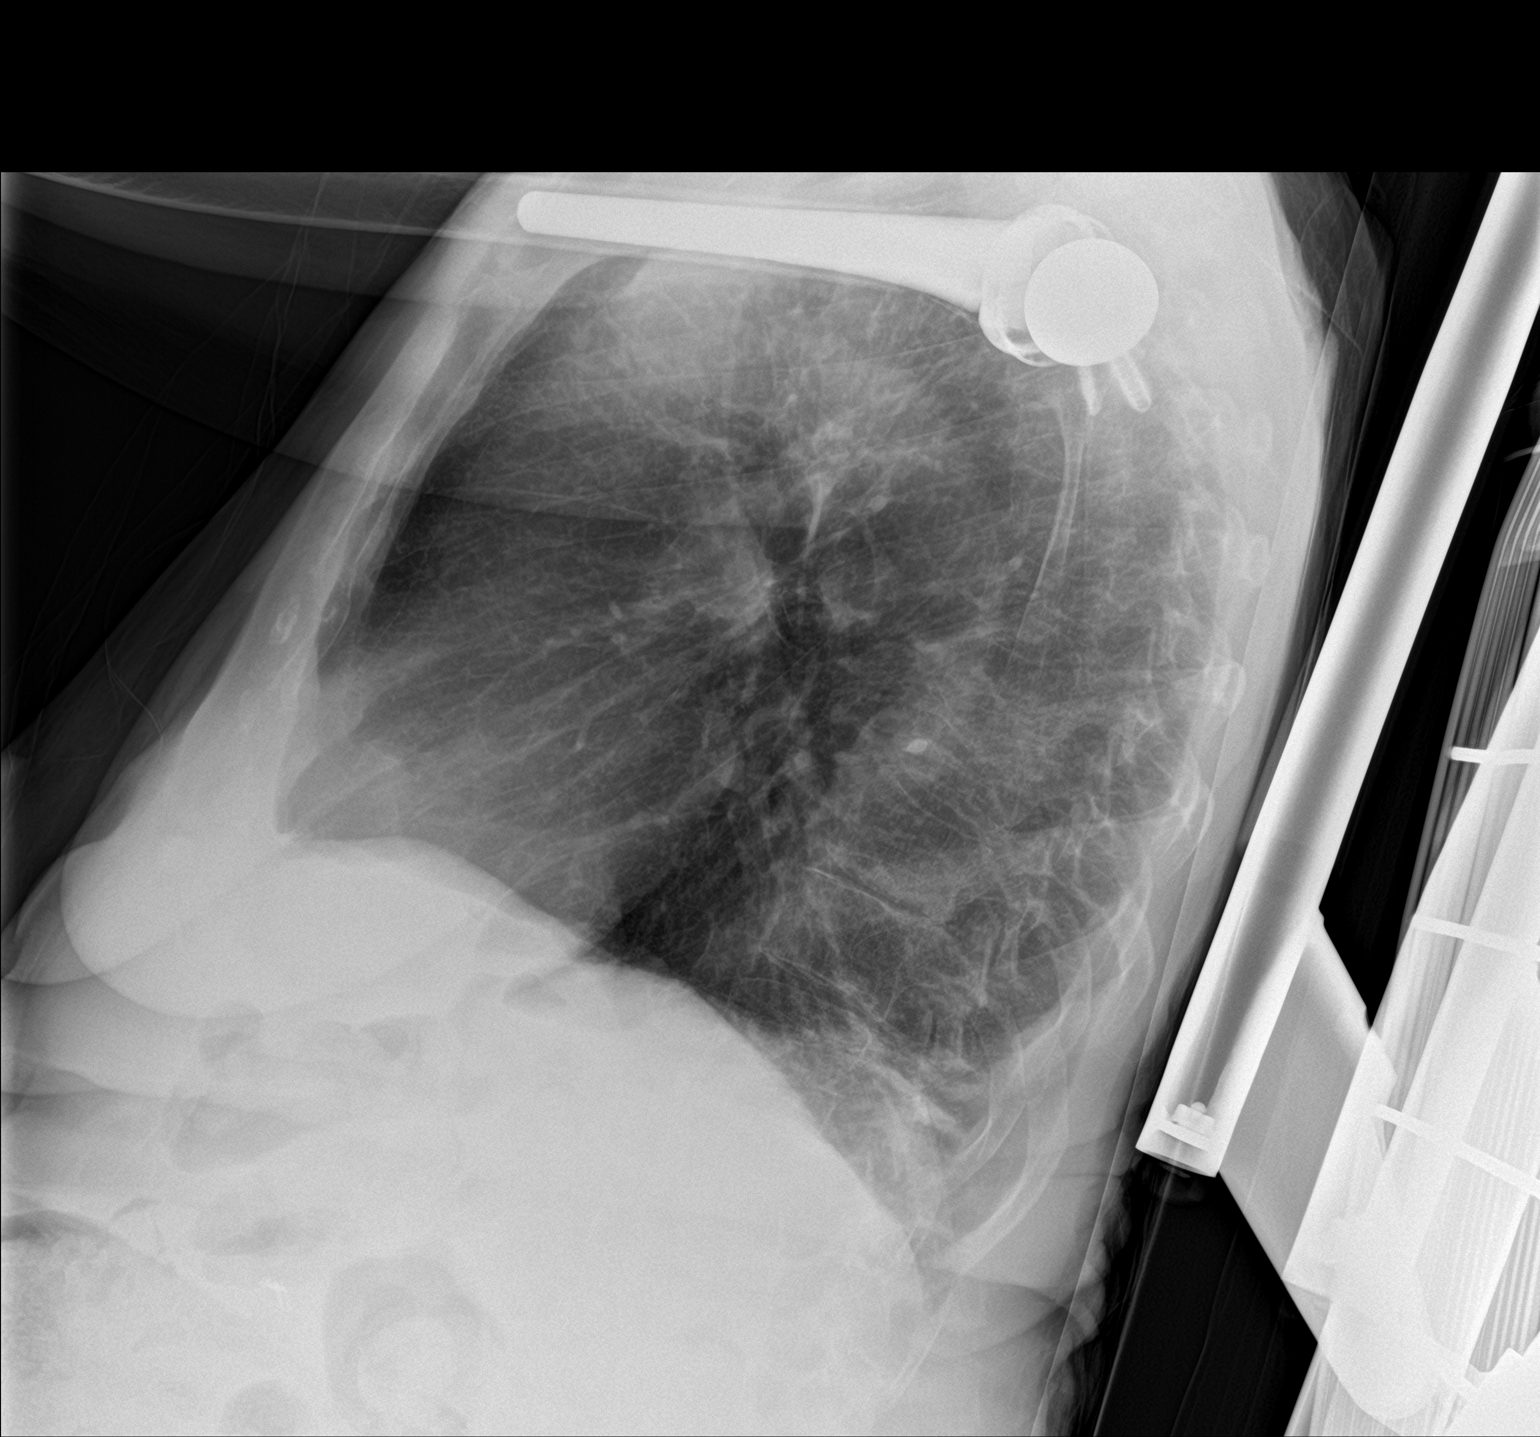
[im 2/2]
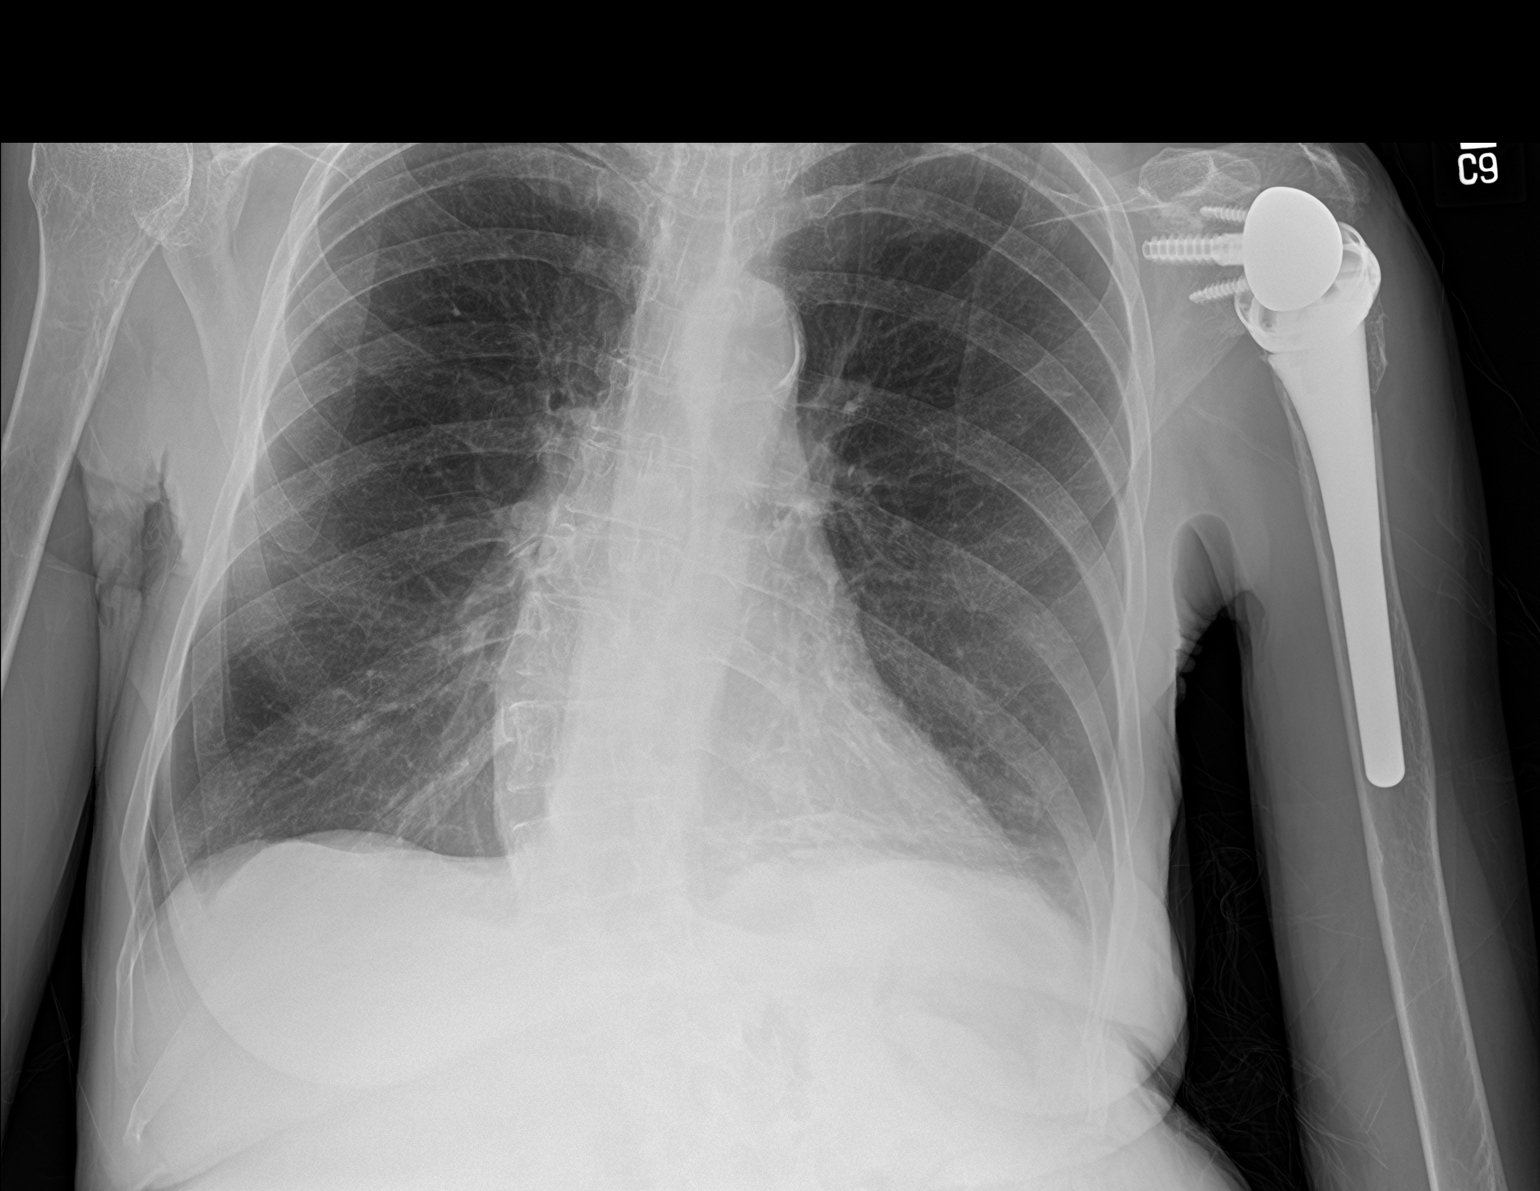

[2 of 2 positions shown; findings below may reference images not displayed]

FINDINGS: Borderline cardiomegaly. No consolidation or edema. Platelike
atelectasis at the LEFT base. No effusion or pneumothorax. LEFT
shoulder replacement. Osteopenia. Calcified tortuous aorta.
IMPRESSION: Borderline cardiomegaly. Worsening aeration from priors. Platelike
atelectasis at the LEFT base.

## 2018-01-19 NOTE — ED Triage Notes (Addendum)
PT c/o CP and increased SOB. Hx of asthma. PT also states she has rash throughout body that started her on foot x1wk ago. PT has multiple complaints. NAD noted . Rash noted redness and hives, states discharge when she showers

## 2018-01-19 NOTE — ED Notes (Signed)
Lab called for blood work, pt stuck x2 with no success

## 2018-01-20 DIAGNOSIS — R21 Rash and other nonspecific skin eruption: Secondary | ICD-10-CM | POA: Diagnosis not present

## 2018-01-20 MED ORDER — PERMETHRIN 5 % EX CREA
TOPICAL_CREAM | Freq: Once | CUTANEOUS | Status: DC
  Filled 2018-01-20: qty 60

## 2018-01-20 MED ORDER — PREDNISONE 20 MG PO TABS
60.0000 mg | ORAL_TABLET | Freq: Once | ORAL | Status: AC
  Administered 2018-01-20: 60 mg via ORAL
  Filled 2018-01-20: qty 3

## 2018-01-20 MED ORDER — CEPHALEXIN 500 MG PO CAPS
500.0000 mg | ORAL_CAPSULE | Freq: Once | ORAL | Status: AC
  Administered 2018-01-20: 500 mg via ORAL
  Filled 2018-01-20: qty 1

## 2018-01-20 MED ORDER — IPRATROPIUM-ALBUTEROL 0.5-2.5 (3) MG/3ML IN SOLN
3.0000 mL | Freq: Once | RESPIRATORY_TRACT | Status: AC
  Administered 2018-01-20: 3 mL via RESPIRATORY_TRACT
  Filled 2018-01-20: qty 3

## 2018-01-20 MED ORDER — CEPHALEXIN 500 MG PO CAPS: 500.0000 mg | ORAL_CAPSULE | Freq: Two times a day (BID) | ORAL | 0 refills | Status: AC

## 2018-01-20 NOTE — ED Notes (Signed)
Patient states having rash that has spread over body in the past week. Patient states having chest pain on right side going down right arm and under rib cage.

## 2018-01-20 NOTE — ED Notes (Signed)
EDP Dr. Manson Passey in with patient

## 2018-01-20 NOTE — ED Provider Notes (Signed)
[facility] Emergency Department Provider Note ____   First MD Initiated Contact with Patient 01/19/18 2354     (approximate)  I have reviewed the triage vital signs and the nursing notes.   HISTORY  Chief Complaint Chest Pain    HPI Suzanne Snyder is a 68 y.o. female with below list of chronic medical conditions including diabetes presents to the emergency department with generalized rash predominately bilateral lower extremity and upper extremity which patient states she has had for "months".  Patient states that she was seen by podiatrist and told that she had athlete's foot.  Patient states that despite applying medication as prescribed rashes worsened and spread.  Patient states that the rash is pruritic and at times especially after taking a shower weeps.  She denies any fever.  In addition the patient admits to coughing and currently experiencing symptoms consistent with "asthma".  Patient states she has a known history of asthma and this is consistent with that.   Past Medical History:  Diagnosis Date  . Anxiety   . Diabetes mellitus without complication (HCC)   . Osteoporosis     There are no active problems to display for this patient.   Past Surgical History:  Procedure Laterality Date  . 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 ROTATOR CUFF REPAIR Left 2014 and 2015  . SPINE SURGERY N/A 1984   L4-5, not sure of procedure    Prior to Admission medications   Medication Sig Start Date End Date Taking? Authorizing Provider  cephALEXin (KEFLEX) 500 MG capsule Take 1 capsule (500 mg total) by mouth 2 (two) times daily for 10 days. 01/20/18 01/30/18  Darci CurrentBrown, [place] N, MD  clonazePAM (KLONOPIN) 0.5 MG tablet Take 0.5 mg by mouth 2 (two) times daily as needed for anxiety.    [provider]  guaiFENesin-codeine 100-10 MG/5ML syrup Take 5 mLs by mouth every 4 (four) hours as needed. 03/05/17   Tommi RumpsSummers, Rhonda L, PA-C  tapentadol  (NUCYNTA) 50 MG TABS tablet Take 50 mg by mouth.    [provider]  Vitamin D, Ergocalciferol, (DRISDOL) 50000 UNITS CAPS capsule Take 50,000 Units by mouth every 7 (seven) days.    [provider]    Allergies Erythromycin  No family history on file.  Social History Social History   Tobacco Use  . Smoking status: Current Every Day Smoker    Packs/day: 1.00    Years: 45.00    Pack years: 45.00    Types: Cigarettes  . Smokeless tobacco: Never Used  Substance Use Topics  . Alcohol use: No  . Drug use: No    Review of Systems Constitutional: No fever/chills Eyes: No visual changes. ENT: No sore throat. Cardiovascular: Denies chest pain. Respiratory: Denies shortness of breath.  Positive for cough and wheezing Gastrointestinal: No abdominal pain.  No nausea, no vomiting.  No diarrhea.  No constipation. Genitourinary: Negative for dysuria. Musculoskeletal: Negative for neck pain.  Negative for back pain. Integumentary: Positive for rash. Neurological: Negative for headaches, focal weakness or numbness.   ____________________________________________   PHYSICAL EXAM:  VITAL SIGNS: ED Triage Vitals  Enc Vitals Group     BP 01/19/18 2322 (!) 136/59     Pulse Rate 01/19/18 2322 (!) 52     Resp 01/19/18 2322 20     Temp 01/19/18 2322 97.8 F (36.6 C)     Temp Source 01/19/18 2322 Oral     SpO2 01/19/18 2322 100 %  Weight --      Height --      Head Circumference --      Peak Flow --      Pain Score 01/19/18 1854 0     Pain Loc --      Pain Edu? --      Excl. in GC? --     Constitutional: Alert and oriented.  Coughing  eyes: Conjunctivae are normal.  Nose: No congestion/rhinnorhea. Mouth/Throat: Mucous membranes are moist. Neck: No stridor.   Cardiovascular: Normal rate, regular rhythm. Good peripheral circulation. Grossly normal heart sounds. Respiratory: Normal respiratory effort.  No retractions.  Mild expiratory wheezing    gastrointestinal: Soft and nontender. No distention.  Musculoskeletal: No lower extremity tenderness nor edema. No gross deformities of extremities. Neurologic:  Normal speech and language. No gross focal neurologic deficits are appreciated.  Skin: Bilateral upper extremity rash involving hands finger webspaces extending to the forearm concerning for possible contact dermatitis versus scabies (given findings consistent with creeping eruptions).  Rash to the right foot again involving the webspaces between the toes with thickening of the skin and associated blanching erythema. Psychiatric: Mood and affect are normal. Speech and behavior are normal.  ____________________________________________   LABS (all labs ordered are listed, but only abnormal results are displayed)  Labs Reviewed  BASIC METABOLIC PANEL - Abnormal; Notable for the following components:      Result Value   CO2 21 (*)    Glucose, Bld 138 (*)    All other components within normal limits  CBC  TROPONIN I   __________ RADIOLOGY I, [place], personally viewed and evaluated these images (plain radiographs) as part of my medical decision making, as well as reviewing the written report by the radiologist.    Official radiology report(s): Dg Chest 2 View  Result Date: 01/19/2018 CLINICAL DATA:  Chest pain, increased shortness of breath. EXAM: CHEST - 2 VIEW COMPARISON:  None. FINDINGS: Borderline cardiomegaly. No consolidation or edema. Platelike atelectasis at the LEFT base. No effusion or pneumothorax. LEFT shoulder replacement. Osteopenia. Calcified tortuous aorta. IMPRESSION: Borderline cardiomegaly. Worsening aeration from priors. Platelike atelectasis at the LEFT base. Electronically Signed   By: Elsie Stain M.D.   On: 01/19/2018 19:19    Procedures   ____________________________________________   INITIAL IMPRESSION / ASSESSMENT AND PLAN / ED COURSE  As part of my medical decision making, I reviewed  the following data within the electronic MEDICAL RECORD NUMBER 68 year old female presented with above-stated history and physical exam concerning for contact dermatitis versus scabies.  Patient given permethrin cream and advised how to apply.  Patient also advised to wash all clothing and to put whenever she could not wash into a Ziploc bag for 10 days.  Regarding patient's wheezing and cough patient given a DuoNeb treatment with improvement.  I advised the patient to follow-up with dermatology for further outpatient evaluation.  Patient referred to Dr. Gwen Pounds    ____________________________________________  FINAL CLINICAL IMPRESSION(S) / ED DIAGNOSES  Final diagnoses:  Skin rash  Cellulitis of right lower extremity     MEDICATIONS GIVEN DURING THIS VISIT:  Medications  permethrin (ELIMITE) 5 % cream (has no administration in time range)  ipratropium-albuterol (DUONEB) 0.5-2.5 (3) MG/3ML nebulizer solution 3 mL (3 mLs Nebulization Given 01/20/18 0024)  cephALEXin (KEFLEX) capsule 500 mg (500 mg Oral Given 01/20/18 0032)  predniSONE (DELTASONE) tablet 60 mg (60 mg Oral Given 01/20/18 0032)     ED Discharge Orders  Ordered    cephALEXin (KEFLEX) 500 MG capsule  2 times daily     01/20/18 0039           Note:  This document was prepared using Dragon voice recognition software and may include unintentional dictation errors.    Darci CurrentBrown, [place] N, MD 01/20/18 0157

## 2018-01-20 NOTE — ED Notes (Signed)
Patient completed neb tx. Patient took oral medication. Will continue to monitor.

## 2018-01-24 ENCOUNTER — Ambulatory Visit: Payer: Medicare Other

## 2018-01-24 DIAGNOSIS — G8929 Other chronic pain: Secondary | ICD-10-CM

## 2018-01-24 DIAGNOSIS — M25612 Stiffness of left shoulder, not elsewhere classified: Secondary | ICD-10-CM

## 2018-01-24 DIAGNOSIS — M6281 Muscle weakness (generalized): Secondary | ICD-10-CM

## 2018-01-24 DIAGNOSIS — M25512 Pain in left shoulder: Secondary | ICD-10-CM | POA: Diagnosis not present

## 2018-01-24 NOTE — Therapy (Signed)
[facility] [address] [place], [address] Phone: [phone]   Fax:  [phone]  Physical Therapy Treatment  Patient Details  Name: Suzanne Snyder MRN: [id number] Date of Birth: 1950-09-26 Referring Provider (PT): Olga Millers MD   Encounter Date: 01/24/2018  PT End of Session - 01/24/18 1112    Visit Number  17    Number of Visits  21    Date for PT Re-Evaluation  02/03/18    Authorization Type  6/10 Medicare    PT Start Time  1030    PT Stop Time  1115    PT Time Calculation (min)  45 min    Activity Tolerance  Patient tolerated treatment well    Behavior During Therapy  [facility] for tasks assessed/performed       Past Medical History:  Diagnosis Date  . Anxiety   . Diabetes mellitus without complication (HCC)   . Osteoporosis     Past Surgical History:  Procedure Laterality Date  . 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 ROTATOR CUFF REPAIR Left 2014 and 2015  . SPINE SURGERY N/A 1984   L4-5, not sure of procedure    There were no vitals filed for this visit.  Subjective Assessment - 01/24/18 1042    Subjective  Patient reports increased pain along her shoulder and back but states her shoulder felt "okay" during shoulder movement while lying on her back. Patient reports she saw a dermatologist and went to the ER from a celluitis on her foot..    Pertinent History  Chronic history of L shoulder pain, sciolosis, laminectomy     Limitations  Lifting    Patient Stated Goals  To improve shoulder function and decrease pain    Currently in Pain?  No/denies    Pain Onset  More than a month ago       TREATMENT Therapeutic Exercise  Bridge with shoulder flexion PVC pipe: 1x25  Chest press with PVC pipe, 7#: 1x25  Woodchopper B 1x25 with THB (Blue)  Standing rows with THB (Blue): 1x25  Side-stepping paloff press B with THB (Blue): 1x15  Right shoulder elevation on exercise ball (Yellow):  2x25   Overhead exercise ball (Yellow) wall tap: 1x25  Shoulder extension in standing: 1x25  Pt demonstrates pain, decreased motion, and decreased strength in left shoulder. Therapeutic exercise performed to decrease pain, and strengthen muscles surrounding shoulder.    PT Long Term Goals - 12/23/17 1430      PT LONG TERM GOAL #1   Title  Patient will improve AROM to 120 degrees left shoulder with mild discomfort for reaching into cabinet or performing hair care     Baseline  95 AROM in sitting; 09/14/2017: 105 deg; 10/28/2017: 115; 12/23/2017: 120    Time  8    Period  Weeks    Status  Achieved      PT LONG TERM GOAL #2   Title  Patient will improve all shoulder strength 4+/5 for flexion and rotation to improve ability to manipulate objects in space.     Baseline  3/5 - 3+/5 within all measured motions; 09/14/2017: 3+/5 with all motion; 10/28/2017: 3+/5 for rotational movement and flexion improvement with extension and adduction to 4-/5; 4-/5 to 3+/5 for all shoulder movements    Time  8    Period  Weeks    Status  On-going      PT LONG TERM GOAL #3  Title  Patient will  be independent with home program for strength and pain control for left UE/shoulder by 04/17/2015    Baseline  limited knowledge of pain control strategies or progression of exercises for left UE; 09/14/2017: Requires moderate cueing for exercise progression; 10/28/2017: Continues to require minimal cueing to correct; 12/23/2017: continues to require moderate cueing for exercise performance    Time  8    Period  Weeks    Status  On-going      PT LONG TERM GOAL #4   Title  Patient will have a worst pain of a 3/10 to allow for performance of household activities without increased pain    Baseline  8/10; 12/23/2017: 8/10    Time  8    Period  Weeks    Status  On-going      PT LONG TERM GOAL #5   Title  Patient will be able to stand for over 5 hours without increase in pain to allow for cooking for long periods of time  wihtout increase in pain    Baseline  Increased pain after 4 hours from standing; 12/23/2017: Able to perform 5 hours of cooking with increased pain    Time  8    Period  Weeks    Status  On-going            Plan - 01/24/18 1114    Clinical Impression Statement  Pt performing well with AAROM shoulder elevation with arm overhead. Pt continues to have pain and decreased strength in left shoulder and pariscapular musculature along with decreaed ability to stand for periods of time. Pt continues to need skilled treatment for functional standing endurance and overhead activities like cooking.    Rehab Potential  Fair    Clinical Impairments Affecting Rehab Potential  (+) highly motivated, (-) age, decreased strength    PT Frequency  2x / week    PT Duration  8 weeks    PT Treatment/Interventions  Passive range of motion;Manual techniques;Electrical Stimulation;Cryotherapy;Moist Heat;Ultrasound;Therapeutic exercise;Therapeutic activities;Neuromuscular re-education;Patient/family education;Scar mobilization;Dry needling;Iontophoresis 4mg /ml Dexamethasone    PT Next Visit Plan  progress AROM exercises    PT Home Exercise Plan  See education section    Consulted and Agree with Plan of Care  Patient       Patient will benefit from skilled therapeutic intervention in order to improve the following deficits and impairments:  Pain, Impaired sensation, Decreased coordination, Decreased mobility, Increased muscle spasms, Decreased activity tolerance, Decreased endurance, Decreased range of motion, Decreased strength  Visit Diagnosis: Chronic left shoulder pain  Muscle weakness (generalized)  Stiffness of left shoulder, not elsewhere classified     Problem List There are no active problems to display for this patient.   Clair Gulling, SPT 01/24/2018, 1:10 PM  [facility] PHYSICAL AND SPORTS MEDICINE [address] [place], [address] Phone:  [phone]   Fax:  [phone]  Name: Suzanne Snyder MRN: [id number] Date of Birth: [birth year]

## 2018-01-27 ENCOUNTER — Ambulatory Visit: Payer: Medicare Other

## 2018-01-27 DIAGNOSIS — M25612 Stiffness of left shoulder, not elsewhere classified: Secondary | ICD-10-CM

## 2018-01-27 DIAGNOSIS — M545 Low back pain, unspecified: Secondary | ICD-10-CM

## 2018-01-27 DIAGNOSIS — M25512 Pain in left shoulder: Principal | ICD-10-CM

## 2018-01-27 DIAGNOSIS — M6281 Muscle weakness (generalized): Secondary | ICD-10-CM

## 2018-01-27 DIAGNOSIS — G8929 Other chronic pain: Secondary | ICD-10-CM

## 2018-01-27 NOTE — Therapy (Signed)
[facility] [address], [place], [address] Phone: [phone]   Fax:  [phone]  Physical Therapy Treatment  Patient Details  Name: Suzanne Snyder MRN: [id number] Date of Birth: 1950/05/23 Referring Provider (PT): Olga Millers MD   Encounter Date: 01/27/2018  PT End of Session - 01/27/18 1527    Visit Number  18    Number of Visits  21    Date for PT Re-Evaluation  02/03/18    Authorization Type  7/10 Medicare    PT Start Time  1430    PT Stop Time  1515    PT Time Calculation (min)  45 min    Activity Tolerance  Patient tolerated treatment well    Behavior During Therapy  [facility] for tasks assessed/performed       Past Medical History:  Diagnosis Date  . Anxiety   . Diabetes mellitus without complication (HCC)   . Osteoporosis     Past Surgical History:  Procedure Laterality Date  . 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 ROTATOR CUFF REPAIR Left 2014 and 2015  . SPINE SURGERY N/A 1984   L4-5, not sure of procedure    There were no vitals filed for this visit.  Subjective Assessment - 01/27/18 1525    Subjective  Patient reports her shoulder has been bothering her, but states she is able to change her pain slighty with exercises.     Pertinent History  Chronic history of L shoulder pain, sciolosis, laminectomy     Limitations  Lifting    Patient Stated Goals  To improve shoulder function and decrease pain    Currently in Pain?  Yes    Pain Score  3     Pain Location  Shoulder    Pain Orientation  Left    Pain Descriptors / Indicators  Aching    Pain Type  Acute pain    Pain Onset  More than a month ago    Pain Frequency  Intermittent       TREATMENT Therapeutic Exercise Shoulder rows in sitting with RTB - 2 x 15 Scapular retraction with therapist support - x 20 Cervical stretches in sitting for upper trap - x 10 with 10sec Chest Press with 7# weight - x 20 with  bridges Upward/downward  woodchopper B 1x25 with GTB  Standing rows with THB (Blue): 1x25 Curtsey Squat - x10 with UE support Side-stepping paloff press B with THB (Blue): 1x15  Performed exercises stressing scapular retraction to decrease pain and improve strength. Performed side-stepping and lumbar exercises to improve hip strength   PT Education - 01/27/18 1526    Education Details  performing scapular retraction in standing with increase in pain     Person(s) Educated  Patient    Methods  Explanation;Demonstration    Comprehension  Verbalized understanding;Returned demonstration          PT Long Term Goals - 12/23/17 1430      PT LONG TERM GOAL #1   Title  Patient will improve AROM to 120 degrees left shoulder with mild discomfort for reaching into cabinet or performing hair care     Baseline  95 AROM in sitting; 09/14/2017: 105 deg; 10/28/2017: 115; 12/23/2017: 120    Time  8    Period  Weeks    Status  Achieved      PT LONG TERM GOAL #2   Title  Patient will improve all shoulder  strength 4+/5 for flexion and rotation to improve ability to manipulate objects in space.     Baseline  3/5 - 3+/5 within all measured motions; 09/14/2017: 3+/5 with all motion; 10/28/2017: 3+/5 for rotational movement and flexion improvement with extension and adduction to 4-/5; 4-/5 to 3+/5 for all shoulder movements    Time  8    Period  Weeks    Status  On-going      PT LONG TERM GOAL #3   Title  Patient will  be independent with home program for strength and pain control for left UE/shoulder by 04/17/2015    Baseline  limited knowledge of pain control strategies or progression of exercises for left UE; 09/14/2017: Requires moderate cueing for exercise progression; 10/28/2017: Continues to require minimal cueing to correct; 12/23/2017: continues to require moderate cueing for exercise performance    Time  8    Period  Weeks    Status  On-going      PT LONG TERM GOAL #4   Title  Patient will have a  worst pain of a 3/10 to allow for performance of household activities without increased pain    Baseline  8/10; 12/23/2017: 8/10    Time  8    Period  Weeks    Status  On-going      PT LONG TERM GOAL #5   Title  Patient will be able to stand for over 5 hours without increase in pain to allow for cooking for long periods of time wihtout increase in pain    Baseline  Increased pain after 4 hours from standing; 12/23/2017: Able to perform 5 hours of cooking with increased pain    Time  8    Period  Weeks    Status  On-going            Plan - 01/27/18 1528    Clinical Impression Statement  Patient demonstrates improvement with exercises with ability to perform curtsey squat without increase in pain. Although patient is improving, she continues to have increased difficulty with performing shoulder-based exercises, most notably about raising her arm against resistance. Patient will benefit from further skilled therapy to return to prior level of function.      Rehab Potential  Fair    Clinical Impairments Affecting Rehab Potential  (+) highly motivated, (-) age, decreased strength    PT Frequency  2x / week    PT Duration  8 weeks    PT Treatment/Interventions  Passive range of motion;Manual techniques;Electrical Stimulation;Cryotherapy;Moist Heat;Ultrasound;Therapeutic exercise;Therapeutic activities;Neuromuscular re-education;Patient/family education;Scar mobilization;Dry needling;Iontophoresis 4mg /ml Dexamethasone    PT Next Visit Plan  progress AROM exercises    PT Home Exercise Plan  See education section    Consulted and Agree with Plan of Care  Patient       Patient will benefit from skilled therapeutic intervention in order to improve the following deficits and impairments:  Pain, Impaired sensation, Decreased coordination, Decreased mobility, Increased muscle spasms, Decreased activity tolerance, Decreased endurance, Decreased range of motion, Decreased strength  Visit  Diagnosis: Chronic left shoulder pain  Muscle weakness (generalized)  Stiffness of left shoulder, not elsewhere classified  Chronic right-sided low back pain without sciatica     Problem List There are no active problems to display for this patient.   Myrene GalasWesley Reiss Mowrey, PT DPT 01/27/2018, 3:32 PM  [facility]IC[facility] [address] [place], [place], [id number] Phone: [phone]   Fax:  [phone]  Name: Suzanne Snyder MRN: [id number] Date of Birth: 1950/02/03

## 2018-01-31 ENCOUNTER — Ambulatory Visit: Payer: Medicare Other

## 2018-02-03 ENCOUNTER — Ambulatory Visit: Payer: Medicare Other

## 2018-02-07 ENCOUNTER — Ambulatory Visit: Payer: Medicare Other

## 2018-02-07 DIAGNOSIS — G8929 Other chronic pain: Secondary | ICD-10-CM

## 2018-02-07 DIAGNOSIS — M6281 Muscle weakness (generalized): Secondary | ICD-10-CM

## 2018-02-07 DIAGNOSIS — M25512 Pain in left shoulder: Principal | ICD-10-CM

## 2018-02-07 DIAGNOSIS — M25612 Stiffness of left shoulder, not elsewhere classified: Secondary | ICD-10-CM

## 2018-02-07 NOTE — Therapy (Signed)
[facility] [address] [place], [place], [address] Phone: [phone]   Fax:  [phone]  Physical Therapy Treatment  Patient Details  Name: Suzanne LeberDeborah L Potvin MRN: [id number] Date of Birth: [birth year] Referring Provider (PT): Olga MillersKamath MD   Encounter Date: 02/07/2018  PT End of Session - 02/07/18 1113    Visit Number  19    Number of Visits  21    Date for PT Re-Evaluation  02/03/18    Authorization Type  8/10 Medicare    PT Start Time  1048    PT Stop Time  1130    PT Time Calculation (min)  42 min    Activity Tolerance  Patient tolerated treatment well    Behavior During Therapy  [facility] for tasks assessed/performed       Past Medical History:  Diagnosis Date  . Anxiety   . Diabetes mellitus without complication (HCC)   . Osteoporosis     Past Surgical History:  Procedure Laterality Date  . 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 ROTATOR CUFF REPAIR Left 2014 and 2015  . SPINE SURGERY N/A 1984   L4-5, not sure of procedure    There were no vitals filed for this visit.  Subjective Assessment - 02/07/18 1108    Subjective  Patient reports she only slept for a total of 30 min last night secondary to being stressed out about her daughter. Patient states she has been perofrming her exercises in standing.     Pertinent History  Chronic history of L shoulder pain, sciolosis, laminectomy     Limitations  Lifting    Patient Stated Goals  To improve shoulder function and decrease pain    Currently in Pain?  Yes    Pain Score  5     Pain Location  Back   shoulder and back   Pain Orientation  Left    Pain Descriptors / Indicators  Aching    Pain Type  Acute pain    Pain Onset  More than a month ago    Pain Frequency  Intermittent       TREATMENT Therapeutic Exercise Upward/downward woodchopper B 1x25 with GTB  Side-stepping Paloff press B with green tubing: 1x15 Bridges with bar with 5# overhead - x  20  Standing rows with THB (green) -- x20 Curtsey Squat - x10 with UE support - x 20  Lunges in standing with UE support - x 20   Performed hip and shoulder-based exercises in standing to improve shoulder strengthening and bracing ability with performing functional activities such as lifting an item overhead in standing  PT Education - 02/07/18 1111    Education Details  form/technique with exercise; HEP: bridges     Person(s) Educated  Patient    Methods  Explanation;Demonstration    Comprehension  Verbalized understanding;Returned demonstration          PT Long Term Goals - 02/07/18 1057      PT LONG TERM GOAL #1   Title  Patient will improve AROM to 120 degrees left shoulder with mild discomfort for reaching into cabinet or performing hair care     Baseline  95 AROM in sitting; 09/14/2017: 105 deg; 10/28/2017: 115; 12/23/2017: 120    Time  8    Period  Weeks    Status  Achieved      PT LONG TERM GOAL #2   Title  Patient will improve all shoulder  strength 4+/5 for flexion and rotation to improve ability to manipulate objects in space.     Baseline  3/5 - 3+/5 within all measured motions; 09/14/2017: 3+/5 with all motion; 10/28/2017: 3+/5 for rotational movement and flexion improvement with extension and adduction to 4-/5; 4-/5 to 3+/5 for all shoulder movements; 02/07/2018: 4-/5 rotational movement IR/ER, 4/5 for flexion and abduction    Time  8    Period  Weeks    Status  On-going      PT LONG TERM GOAL #3   Title  Patient will  be independent with home program for strength and pain control for left UE/shoulder by 04/17/2015    Baseline  limited knowledge of pain control strategies or progression of exercises for left UE; 09/14/2017: Requires moderate cueing for exercise progression; 10/28/2017: Continues to require minimal cueing to correct; 12/23/2017: continues to require moderate cueing for exercise performance; 02/07/2018: independent with HEP    Time  8    Period  Weeks     Status  Achieved      PT LONG TERM GOAL #4   Title  Patient will have a worst pain of a 3/10 to allow for performance of household activities without increased pain    Baseline  8/10; 12/23/2017: 8/10; 02/07/2018: 7/10 Both shoulder and LBP    Time  8    Period  Weeks    Status  On-going      PT LONG TERM GOAL #5   Title  Patient will be able to stand for over 5 hours without increase in pain to allow for cooking for long periods of time wihtout increase in pain    Baseline  Increased pain after 4 hours from standing; 12/23/2017: Able to perform 5 hours of cooking with increased pain; 02/07/2018: Has not been cooking secondary to family difficulties    Time  8    Period  Weeks    Status  On-going      Additional Long Term Goals   Additional Long Term Goals  Yes      PT LONG TERM GOAL #6   Title  Patient will improve her MODI to under 48% to indicate significant improvement in lower back function and improvement with cooking in standing.    Baseline  58% MODI dysfunction    Time  6    Period  Weeks    Status  New            Plan - 02/07/18 1121    Clinical Impression Statement  Patient is making progress towards long term goals with overall improvement in shoulder strength. Patient demonstrates overall improvement overall with low back pain however is functionally similiar to the previous session secondary to patient reporting increased family difficulties limiting her performance of functional activities. Recommended patient seek out a psychologist to address theses concerns. Patient is overall improving and will benefit from further skilled therapy to return to prior level of function.     Rehab Potential  Fair    Clinical Impairments Affecting Rehab Potential  (+) highly motivated, (-) age, decreased strength    PT Frequency  2x / week    PT Duration  8 weeks    PT Treatment/Interventions  Passive range of motion;Manual techniques;Electrical Stimulation;Cryotherapy;Moist  Heat;Ultrasound;Therapeutic exercise;Therapeutic activities;Neuromuscular re-education;Patient/family education;Scar mobilization;Dry needling;Iontophoresis 4mg /ml Dexamethasone    PT Next Visit Plan  progress AROM exercises    PT Home Exercise Plan  See education section    Consulted and Agree with Plan  of Care  Patient       Patient will benefit from skilled therapeutic intervention in order to improve the following deficits and impairments:  Pain, Impaired sensation, Decreased coordination, Decreased mobility, Increased muscle spasms, Decreased activity tolerance, Decreased endurance, Decreased range of motion, Decreased strength  Visit Diagnosis: Chronic left shoulder pain  Muscle weakness (generalized)  Stiffness of left shoulder, not elsewhere classified     Problem List There are no active problems to display for this patient.   [name], PT DPT 02/07/2018, 12:43 PM  [facility] PHYSICAL AND SPORTS MEDICINE [address], [place], [address] Phone: [phone]   Fax:  [phone]  Name: MAGUIRE MEOLA MRN: [id number] Date of Birth: 10/30/1950

## 2018-02-10 ENCOUNTER — Ambulatory Visit: Payer: Medicare Other

## 2018-02-10 DIAGNOSIS — M6281 Muscle weakness (generalized): Secondary | ICD-10-CM

## 2018-02-10 DIAGNOSIS — M25512 Pain in left shoulder: Principal | ICD-10-CM

## 2018-02-10 DIAGNOSIS — G8929 Other chronic pain: Secondary | ICD-10-CM

## 2018-02-10 DIAGNOSIS — M25612 Stiffness of left shoulder, not elsewhere classified: Secondary | ICD-10-CM

## 2018-02-10 NOTE — Therapy (Signed)
[facility] [address] [place], [place], [address] Phone: [phone]   Fax:  [phone]  Physical Therapy Treatment  Patient Details  Name: Suzanne Snyder MRN: [id number] Date of Birth: 1950/11/27 Referring Provider (PT): Olga MillersKamath MD   Encounter Date: 02/10/2018  PT End of Session - 02/10/18 1303    Visit Number  20    Number of Visits  27    Date for PT Re-Evaluation  03/07/18    Authorization Type  1/10 Medicare    PT Start Time  1250    PT Stop Time  1335    PT Time Calculation (min)  45 min    Activity Tolerance  Patient tolerated treatment well    Behavior During Therapy  [facility] for tasks assessed/performed       Past Medical History:  Diagnosis Date  . Anxiety   . Diabetes mellitus without complication (HCC)   . Osteoporosis     Past Surgical History:  Procedure Laterality Date  . 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 ROTATOR CUFF REPAIR Left 2014 and 2015  . SPINE SURGERY N/A 1984   L4-5, not sure of procedure    There were no vitals filed for this visit.  Subjective Assessment - 02/10/18 1257    Subjective  Patient reports no major changes since the previous session. Patient states she has been sleeping slightly more since the previous session.     Pertinent History  Chronic history of L shoulder pain, sciolosis, laminectomy     Limitations  Lifting    Patient Stated Goals  To improve shoulder function and decrease pain    Currently in Pain?  Yes    Pain Score  4     Pain Location  Back    Pain Orientation  Left    Pain Descriptors / Indicators  Aching    Pain Type  Acute pain    Pain Onset  More than a month ago    Pain Frequency  Intermittent       TREATMENT Therapeutic Exercise Bridges with bar with 7# overhead - x 20  Upward/downward woodchopper B 1x25 with Black TB  Sit to stand with overhead press - x 20 2# Sit to stand with TRX - x 20 Lunges in standing with UE  support - x 20 with TRX Rows with TRX with cueing - x 20 Low row with blackTB - x 20 for shoulder strengthening  Hip abduction at hip machine - x 20 40# to address hip strengthening limitations   Performed hip and shoulder-based exercises in standing to improve shoulder strengthening and bracing ability with performing functional activities such as lifting an item overhead in standing   PT Education - 02/10/18 1303    Education Details  form/technique with exercises    Person(s) Educated  Patient    Methods  Explanation;Demonstration    Comprehension  Verbalized understanding;Returned demonstration          PT Long Term Goals - 02/07/18 1057      PT LONG TERM GOAL #1   Title  Patient will improve AROM to 120 degrees left shoulder with mild discomfort for reaching into cabinet or performing hair care     Baseline  95 AROM in sitting; 09/14/2017: 105 deg; 10/28/2017: 115; 12/23/2017: 120    Time  8    Period  Weeks    Status  Achieved      PT LONG  TERM GOAL #2   Title  Patient will improve all shoulder strength 4+/5 for flexion and rotation to improve ability to manipulate objects in space.     Baseline  3/5 - 3+/5 within all measured motions; 09/14/2017: 3+/5 with all motion; 10/28/2017: 3+/5 for rotational movement and flexion improvement with extension and adduction to 4-/5; 4-/5 to 3+/5 for all shoulder movements; 02/07/2018: 4-/5 rotational movement IR/ER, 4/5 for flexion and abduction    Time  8    Period  Weeks    Status  On-going      PT LONG TERM GOAL #3   Title  Patient will  be independent with home program for strength and pain control for left UE/shoulder by 04/17/2015    Baseline  limited knowledge of pain control strategies or progression of exercises for left UE; 09/14/2017: Requires moderate cueing for exercise progression; 10/28/2017: Continues to require minimal cueing to correct; 12/23/2017: continues to require moderate cueing for exercise performance; 02/07/2018:  independent with HEP    Time  8    Period  Weeks    Status  Achieved      PT LONG TERM GOAL #4   Title  Patient will have a worst pain of a 3/10 to allow for performance of household activities without increased pain    Baseline  8/10; 12/23/2017: 8/10; 02/07/2018: 7/10 Both shoulder and LBP    Time  8    Period  Weeks    Status  On-going      PT LONG TERM GOAL #5   Title  Patient will be able to stand for over 5 hours without increase in pain to allow for cooking for long periods of time wihtout increase in pain    Baseline  Increased pain after 4 hours from standing; 12/23/2017: Able to perform 5 hours of cooking with increased pain; 02/07/2018: Has not been cooking secondary to family difficulties    Time  8    Period  Weeks    Status  On-going      Additional Long Term Goals   Additional Long Term Goals  Yes      PT LONG TERM GOAL #6   Title  Patient will improve her MODI to under 48% to indicate significant improvement in lower back function and improvement with cooking in standing.    Baseline  58% MODI dysfunction    Time  6    Period  Weeks    Status  New            Plan - 02/10/18 1306    Clinical Impression Statement  Patient demonstrates improvement with exercises with ability to perform exercises with greater amonut of resistance compared to previous session indicating functional carryover between sessions. Patient continues to have decreased shoulder flexion strength and atempted to combine all motions to adress limitations. Paitent will benefit from further skilled therapy to return to prior level of function.     Rehab Potential  Fair    Clinical Impairments Affecting Rehab Potential  (+) highly motivated, (-) age, decreased strength    PT Frequency  2x / week    PT Duration  8 weeks    PT Treatment/Interventions  Passive range of motion;Manual techniques;Electrical Stimulation;Cryotherapy;Moist Heat;Ultrasound;Therapeutic exercise;Therapeutic  activities;Neuromuscular re-education;Patient/family education;Scar mobilization;Dry needling;Iontophoresis 4mg /ml Dexamethasone    PT Next Visit Plan  progress AROM exercises    PT Home Exercise Plan  See education section    Consulted and Agree with Plan of Care  Patient  Patient will benefit from skilled therapeutic intervention in order to improve the following deficits and impairments:  Pain, Impaired sensation, Decreased coordination, Decreased mobility, Increased muscle spasms, Decreased activity tolerance, Decreased endurance, Decreased range of motion, Decreased strength  Visit Diagnosis: Chronic left shoulder pain  Muscle weakness (generalized)  Stiffness of left shoulder, not elsewhere classified     Problem List There are no active problems to display for this patient.   Myrene GalasWesley Shaya Reddick, PT DPT 02/10/2018, 1:34 PM  [facility] [address] [place], [place], [address] Phone: [phone]   Fax:  [phone]  Name: Suzanne Snyder MRN: [id number] Date of Birth: [birth year]

## 2018-02-14 ENCOUNTER — Ambulatory Visit: Payer: Medicare Other | Attending: Orthopedic Surgery

## 2018-02-14 DIAGNOSIS — M25512 Pain in left shoulder: Secondary | ICD-10-CM | POA: Diagnosis not present

## 2018-02-14 DIAGNOSIS — M545 Low back pain: Secondary | ICD-10-CM | POA: Diagnosis present

## 2018-02-14 DIAGNOSIS — M6281 Muscle weakness (generalized): Secondary | ICD-10-CM | POA: Diagnosis present

## 2018-02-14 DIAGNOSIS — G8929 Other chronic pain: Secondary | ICD-10-CM | POA: Diagnosis present

## 2018-02-14 DIAGNOSIS — M25612 Stiffness of left shoulder, not elsewhere classified: Secondary | ICD-10-CM | POA: Diagnosis present

## 2018-02-14 NOTE — Therapy (Signed)
[facility] [address] [place], [place], [address] Phone: [phone]   Fax:  [phone]  Physical Therapy Treatment  Patient Details  Name: Suzanne Snyder MRN: [id number] Date of Birth: [birth year] Referring Provider (PT): Olga MillersKamath MD   Encounter Date: 02/14/2018  PT End of Session - 02/14/18 1528    Visit Number  21    Number of Visits  27    Date for PT Re-Evaluation  03/07/18    Authorization Type  2/10 Medicare    PT Start Time  1430    PT Stop Time  1515    PT Time Calculation (min)  45 min    Activity Tolerance  Patient tolerated treatment well    Behavior During Therapy  [facility] tasks assessed/performed       Past Medical History:  Diagnosis Date  . Anxiety   . Diabetes mellitus without complication (HCC)   . Osteoporosis     Past Surgical History:  Procedure Laterality Date  . 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 ROTATOR CUFF REPAIR Left 2014 and 2015  . SPINE SURGERY N/A 1984   L4-5, not sure of procedure    There were no vitals filed for this visit.  Subjective Assessment - 02/14/18 1503    Subjective  Patient reports she is worried about stopping therapy because she feels she is making progress. Patient reports she would like to improve in her shoulder strength and hip strength.     Pertinent History  Chronic history of L shoulder pain, sciolosis, laminectomy     Limitations  Lifting;Sitting    Patient Stated Goals  To improve shoulder function and decrease pain    Currently in Pain?  Yes    Pain Score  4     Pain Location  Back   shoulder   Pain Orientation  Left    Pain Descriptors / Indicators  Aching    Pain Onset  More than a month ago    Pain Frequency  Intermittent       TREATMENT Therapeutic Exercise Bridges with bar with 7# overhead - x 20  Upward/downward woodchopper B 1x25 with GTB with arms straight Chest Press with 7# x 10 B UE; #5 solo L UE  Row in  standing with UE support - x 20 with black TB Palof press side stepping with GTB - x 20  Lunges backward with TRX - x 20 Hip abduction at TRX - x 20    Performed hip and shoulder-based exercises in standing to improve shoulder strengthening and bracing ability with performing functional activities such as lifting an item overhead in standing   PT Education - 02/14/18 1529    Education Details  form/technique with exercise    Person(s) Educated  Patient    Methods  Explanation;Demonstration    Comprehension  Verbalized understanding;Returned demonstration          PT Long Term Goals - 02/07/18 1057      PT LONG TERM GOAL #1   Title  Patient will improve AROM to 120 degrees left shoulder with mild discomfort for reaching into cabinet or performing hair care     Baseline  95 AROM in sitting; 09/14/2017: 105 deg; 10/28/2017: 115; 12/23/2017: 120    Time  8    Period  Weeks    Status  Achieved      PT LONG TERM GOAL #2   Title  Patient will  improve all shoulder strength 4+/5 for flexion and rotation to improve ability to manipulate objects in space.     Baseline  3/5 - 3+/5 within all measured motions; 09/14/2017: 3+/5 with all motion; 10/28/2017: 3+/5 for rotational movement and flexion improvement with extension and adduction to 4-/5; 4-/5 to 3+/5 for all shoulder movements; 02/07/2018: 4-/5 rotational movement IR/ER, 4/5 for flexion and abduction    Time  8    Period  Weeks    Status  On-going      PT LONG TERM GOAL #3   Title  Patient will  be independent with home program for strength and pain control for left UE/shoulder by 04/17/2015    Baseline  limited knowledge of pain control strategies or progression of exercises for left UE; 09/14/2017: Requires moderate cueing for exercise progression; 10/28/2017: Continues to require minimal cueing to correct; 12/23/2017: continues to require moderate cueing for exercise performance; 02/07/2018: independent with HEP    Time  8    Period  Weeks     Status  Achieved      PT LONG TERM GOAL #4   Title  Patient will have a worst pain of a 3/10 to allow for performance of household activities without increased pain    Baseline  8/10; 12/23/2017: 8/10; 02/07/2018: 7/10 Both shoulder and LBP    Time  8    Period  Weeks    Status  On-going      PT LONG TERM GOAL #5   Title  Patient will be able to stand for over 5 hours without increase in pain to allow for cooking for long periods of time wihtout increase in pain    Baseline  Increased pain after 4 hours from standing; 12/23/2017: Able to perform 5 hours of cooking with increased pain; 02/07/2018: Has not been cooking secondary to family difficulties    Time  8    Period  Weeks    Status  On-going      Additional Long Term Goals   Additional Long Term Goals  Yes      PT LONG TERM GOAL #6   Title  Patient will improve her MODI to under 48% to indicate significant improvement in lower back function and improvement with cooking in standing.    Baseline  58% MODI dysfunction    Time  6    Period  Weeks    Status  New            Plan - 02/14/18 1531    Clinical Impression Statement  Patient demonstrates improvement with shoulder flexion today with ability to perform greater amount of shoulder flexion AROM into resistance. Although patient is improving, she continues to have difficulty with performing shoulder flexion into more restistance compared to previous sessions. Although patient is improving, she continues to demonstrate difficulty with performing squatting motions without use of UEs. Patient will benefit from further skilled therapy to return to prior level of function.     Rehab Potential  Fair    Clinical Impairments Affecting Rehab Potential  (+) highly motivated, (-) age, decreased strength    PT Frequency  2x / week    PT Duration  8 weeks    PT Treatment/Interventions  Passive range of motion;Manual techniques;Electrical Stimulation;Cryotherapy;Moist  Heat;Ultrasound;Therapeutic exercise;Therapeutic activities;Neuromuscular re-education;Patient/family education;Scar mobilization;Dry needling;Iontophoresis 4mg /ml Dexamethasone    PT Next Visit Plan  progress AROM exercises    PT Home Exercise Plan  See education section    Consulted and Agree with  Plan of Care  Patient       Patient will benefit from skilled therapeutic intervention in order to improve the following deficits and impairments:  Pain, Impaired sensation, Decreased coordination, Decreased mobility, Increased muscle spasms, Decreased activity tolerance, Decreased endurance, Decreased range of motion, Decreased strength  Visit Diagnosis: Chronic left shoulder pain  Muscle weakness (generalized)     Problem List There are no active problems to display for this patient.   [name], PT DPT 02/14/2018, 3:47 PM  [facility] PHYSICAL AND SPORTS MEDICINE [address], [place], [address] Phone: [phone]   Fax:  [phone]  Name: Suzanne Snyder MRN: [id number] Date of Birth: [birth year]

## 2018-02-17 ENCOUNTER — Ambulatory Visit: Payer: Medicare Other

## 2018-02-17 DIAGNOSIS — M6281 Muscle weakness (generalized): Secondary | ICD-10-CM

## 2018-02-17 DIAGNOSIS — M25512 Pain in left shoulder: Principal | ICD-10-CM

## 2018-02-17 DIAGNOSIS — G8929 Other chronic pain: Secondary | ICD-10-CM

## 2018-02-17 NOTE — Therapy (Signed)
[facility] [facility] [address] [place], [address] Phone: [phone]   Fax:  [phone]  Physical Therapy Treatment  Patient Details  Name: Suzanne Snyder MRN: [id number] Date of Birth: May 09, 1950 Referring Provider (PT): Olga Millers MD   Encounter Date: 02/17/2018  PT End of Session - 02/17/18 1329    Visit Number  22    Number of Visits  27    Date for PT Re-Evaluation  03/07/18    Authorization Type  3/10 Medicare    PT Start Time  1300    PT Stop Time  1345    PT Time Calculation (min)  45 min    Activity Tolerance  Patient tolerated treatment well    Behavior During Therapy  [facility] for tasks assessed/performed       Past Medical History:  Diagnosis Date  . Anxiety   . Diabetes mellitus without complication (HCC)   . Osteoporosis     Past Surgical History:  Procedure Laterality Date  . 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 ROTATOR CUFF REPAIR Left 2014 and 2015  . SPINE SURGERY N/A 1984   L4-5, not sure of procedure    There were no vitals filed for this visit.  Subjective Assessment - 02/17/18 1326    Subjective  Patient reports improvement overall but states she continues to have difficulty sleeping and raising her arm overhead.     Pertinent History  Chronic history of L shoulder pain, sciolosis, laminectomy     Limitations  Lifting;Sitting    Patient Stated Goals  To improve shoulder function and decrease pain    Currently in Pain?  Yes    Pain Score  2     Pain Location  Back    Pain Orientation  Left    Pain Descriptors / Indicators  Aching    Pain Type  Acute pain    Pain Onset  More than a month ago    Pain Frequency  Intermittent       TREATMENT Therapeutic Exercise Bridges with bar with 8# overhead - x 20 in hooklying Overhead bodyblade up/down -forward and back with arms at 90deg; with arms at side 2 x 20sec; Side stepping with RTB around knees with holding 2# in paloff  position- 4 B x 15ft  Chest Press -- #4 solo L UE with B UE - x 20  Leg Press in sitting for LE strengthening and power training - x 20  Sit to stand with arms overhead stretching - x 20   Performed hip and shoulder-based exercises in standing to improve shoulder strengthening and bracing ability with performing functional activities such as lifting an item overhead in standing. Performed pushing and pulling based motions to improve ability   PT Education - 02/17/18 1328    Education Details  form/technique with exercise    Person(s) Educated  Patient    Methods  Explanation;Demonstration    Comprehension  Returned demonstration;Verbalized understanding          PT Long Term Goals - 02/07/18 1057      PT LONG TERM GOAL #1   Title  Patient will improve AROM to 120 degrees left shoulder with mild discomfort for reaching into cabinet or performing hair care     Baseline  95 AROM in sitting; 09/14/2017: 105 deg; 10/28/2017: 115; 12/23/2017: 120    Time  8    Period  Weeks    Status  Achieved      PT LONG TERM GOAL #2   Title  Patient will improve all shoulder strength 4+/5 for flexion and rotation to improve ability to manipulate objects in space.     Baseline  3/5 - 3+/5 within all measured motions; 09/14/2017: 3+/5 with all motion; 10/28/2017: 3+/5 for rotational movement and flexion improvement with extension and adduction to 4-/5; 4-/5 to 3+/5 for all shoulder movements; 02/07/2018: 4-/5 rotational movement IR/ER, 4/5 for flexion and abduction    Time  8    Period  Weeks    Status  On-going      PT LONG TERM GOAL #3   Title  Patient will  be independent with home program for strength and pain control for left UE/shoulder by 04/17/2015    Baseline  limited knowledge of pain control strategies or progression of exercises for left UE; 09/14/2017: Requires moderate cueing for exercise progression; 10/28/2017: Continues to require minimal cueing to correct; 12/23/2017: continues to require  moderate cueing for exercise performance; 02/07/2018: independent with HEP    Time  8    Period  Weeks    Status  Achieved      PT LONG TERM GOAL #4   Title  Patient will have a worst pain of a 3/10 to allow for performance of household activities without increased pain    Baseline  8/10; 12/23/2017: 8/10; 02/07/2018: 7/10 Both shoulder and LBP    Time  8    Period  Weeks    Status  On-going      PT LONG TERM GOAL #5   Title  Patient will be able to stand for over 5 hours without increase in pain to allow for cooking for long periods of time wihtout increase in pain    Baseline  Increased pain after 4 hours from standing; 12/23/2017: Able to perform 5 hours of cooking with increased pain; 02/07/2018: Has not been cooking secondary to family difficulties    Time  8    Period  Weeks    Status  On-going      Additional Long Term Goals   Additional Long Term Goals  Yes      PT LONG TERM GOAL #6   Title  Patient will improve her MODI to under 48% to indicate significant improvement in lower back function and improvement with cooking in standing.    Baseline  58% MODI dysfunction    Time  6    Period  Weeks    Status  New            Plan - 02/17/18 1401    Clinical Impression Statement  Patient demonstrates improvement with exercises overall with ability to perform greater amount of resistance with exercise compared to previous sessions indicating improvement in strength. Although patient is improving, she continues to have difficulty with raising her affected arm without assistance from her contralateral side. Patient will benefit from further skilled therapy to return to prior level of function.     Rehab Potential  Fair    Clinical Impairments Affecting Rehab Potential  (+) highly motivated, (-) age, decreased strength    PT Frequency  2x / week    PT Duration  8 weeks    PT Treatment/Interventions  Passive range of motion;Manual techniques;Electrical Stimulation;Cryotherapy;Moist  Heat;Ultrasound;Therapeutic exercise;Therapeutic activities;Neuromuscular re-education;Patient/family education;Scar mobilization;Dry needling;Iontophoresis 4mg /ml Dexamethasone    PT Next Visit Plan  progress AROM exercises    PT Home Exercise Plan  See education section  Consulted and Agree with Plan of Care  Patient       Patient will benefit from skilled therapeutic intervention in order to improve the following deficits and impairments:  Pain, Impaired sensation, Decreased coordination, Decreased mobility, Increased muscle spasms, Decreased activity tolerance, Decreased endurance, Decreased range of motion, Decreased strength  Visit Diagnosis: Chronic left shoulder pain  Muscle weakness (generalized)     Problem List There are no active problems to display for this patient.   Myrene GalasWesley Caran Storck, PT DPT 02/17/2018, 2:14 PM  [facility] [facility] [address] [place], [place], [address] Phone: [phone]   Fax:  [phone]  Name: Suzanne Snyder MRN: [id number] Date of Birth: 04-Feb-1950

## 2018-02-21 ENCOUNTER — Ambulatory Visit: Payer: Medicare Other

## 2018-02-21 DIAGNOSIS — M6281 Muscle weakness (generalized): Secondary | ICD-10-CM

## 2018-02-21 DIAGNOSIS — G8929 Other chronic pain: Secondary | ICD-10-CM

## 2018-02-21 DIAGNOSIS — M25512 Pain in left shoulder: Principal | ICD-10-CM

## 2018-02-21 DIAGNOSIS — M25612 Stiffness of left shoulder, not elsewhere classified: Secondary | ICD-10-CM

## 2018-02-21 NOTE — Therapy (Signed)
[facility] [address] [place], [place], [address] Phone: [phone]   Fax:  [phone]  Physical Therapy Treatment  Patient Details  Name: Suzanne LeberDeborah L Cindric MRN: [id number] Date of Birth: 13-Jul-1950 Referring Provider (PT): Olga MillersKamath MD   Encounter Date: 02/21/2018  PT End of Session - 02/21/18 1403    Visit Number  23    Number of Visits  27    Date for PT Re-Evaluation  03/07/18    Authorization Type  4/10 Medicare    PT Start Time  1353    PT Stop Time  1431    PT Time Calculation (min)  38 min    Activity Tolerance  Patient tolerated treatment well    Behavior During Therapy  [facility]       Past Medical History:  Diagnosis Date  . Anxiety   . Diabetes mellitus without complication (HCC)   . Osteoporosis     Past Surgical History:  Procedure Laterality Date  . 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 ROTATOR CUFF REPAIR Left 2014 and 2015  . SPINE SURGERY N/A 1984   L4-5, not sure of procedure    There were no vitals filed for this visit.  Subjective Assessment - 02/21/18 1401    Subjective  Patient reports she continues to improve and states she is happy with her recent progression. Patient states changing her bed takes around 3 hours of time.     Pertinent History  Chronic history of L shoulder pain, sciolosis, laminectomy     Limitations  Lifting;Sitting    Patient Stated Goals  To improve shoulder function and decrease pain    Currently in Pain?  Yes    Pain Score  2     Pain Location  Back    Pain Orientation  Left    Pain Descriptors / Indicators  Aching    Pain Type  Acute pain    Pain Onset  More than a month ago    Pain Frequency  Intermittent       TREATMENT Therapeutic Exercise Bridges with bar with 9# overhead - x 20 in hooklying Side stepping with RTB around knees with holding 2# in paloff position- 4 B x 5820ft with RTB around ankles Chest Press --  with B UE 9# B UE - x 20  Sit to stand with arms overhead stretching - x 20 with RTB Standing rotations in standing with Black TB -- x 20   Performed hip and shoulder-based exercises in standing to improve shoulder strengthening and bracing ability with performing functional activities such as lifting an item overhead in standing. Performed pushing and pulling based motions to improve ability   PT Education - 02/21/18 1402    Education Details  form/technique with exercise; HEP: shoulder flexion in standing    Person(s) Educated  Patient    Methods  Explanation;Demonstration    Comprehension  Verbalized understanding;Returned demonstration          PT Long Term Goals - 02/07/18 1057      PT LONG TERM GOAL #1   Title  Patient will improve AROM to 120 degrees left shoulder with mild discomfort for reaching into cabinet or performing hair care     Baseline  95 AROM in sitting; 09/14/2017: 105 deg; 10/28/2017: 115; 12/23/2017: 120    Time  8    Period  Weeks    Status  Achieved  PT LONG TERM GOAL #2   Title  Patient will improve all shoulder strength 4+/5 for flexion and rotation to improve ability to manipulate objects in space.     Baseline  3/5 - 3+/5 within all measured motions; 09/14/2017: 3+/5 with all motion; 10/28/2017: 3+/5 for rotational movement and flexion improvement with extension and adduction to 4-/5; 4-/5 to 3+/5 for all shoulder movements; 02/07/2018: 4-/5 rotational movement IR/ER, 4/5 for flexion and abduction    Time  8    Period  Weeks    Status  On-going      PT LONG TERM GOAL #3   Title  Patient will  be independent with home program for strength and pain control for left UE/shoulder by 04/17/2015    Baseline  limited knowledge of pain control strategies or progression of exercises for left UE; 09/14/2017: Requires moderate cueing for exercise progression; 10/28/2017: Continues to require minimal cueing to correct; 12/23/2017: continues to require moderate cueing  for exercise performance; 02/07/2018: independent with HEP    Time  8    Period  Weeks    Status  Achieved      PT LONG TERM GOAL #4   Title  Patient will have a worst pain of a 3/10 to allow for performance of household activities without increased pain    Baseline  8/10; 12/23/2017: 8/10; 02/07/2018: 7/10 Both shoulder and LBP    Time  8    Period  Weeks    Status  On-going      PT LONG TERM GOAL #5   Title  Patient will be able to stand for over 5 hours without increase in pain to allow for cooking for long periods of time wihtout increase in pain    Baseline  Increased pain after 4 hours from standing; 12/23/2017: Able to perform 5 hours of cooking with increased pain; 02/07/2018: Has not been cooking secondary to family difficulties    Time  8    Period  Weeks    Status  On-going      Additional Long Term Goals   Additional Long Term Goals  Yes      PT LONG TERM GOAL #6   Title  Patient will improve her MODI to under 48% to indicate significant improvement in lower back function and improvement with cooking in standing.    Baseline  58% MODI dysfunction    Time  6    Period  Weeks    Status  New            Plan - 02/21/18 1405    Clinical Impression Statement  Patient demonstrates improvement with ability to perform greater amount of exercises and at greater resistances compared to previous sessions. Although patient is improving she continues to have increased diffculty with performing dynamic movements with combined arm and LE movements. Patient will benefit from further skilled therapy focused on improving balance, LE strength, and Low back coordination/strength and patient will benefit from further skilled therapy to return to prior level of function.     Rehab Potential  Fair    Clinical Impairments Affecting Rehab Potential  (+) highly motivated, (-) age, decreased strength    PT Frequency  2x / week    PT Duration  8 weeks    PT Treatment/Interventions  Passive  range of motion;Manual techniques;Electrical Stimulation;Cryotherapy;Moist Heat;Ultrasound;Therapeutic exercise;Therapeutic activities;Neuromuscular re-education;Patient/family education;Scar mobilization;Dry needling;Iontophoresis 4mg /ml Dexamethasone    PT Next Visit Plan  progress AROM exercises    PT Home Exercise  Plan  See education section    Consulted and Agree with Plan of Care  Patient       Patient will benefit from skilled therapeutic intervention in order to improve the following deficits and impairments:  Pain, Impaired sensation, Decreased coordination, Decreased mobility, Increased muscle spasms, Decreased activity tolerance, Decreased endurance, Decreased range of motion, Decreased strength  Visit Diagnosis: Chronic left shoulder pain  Muscle weakness (generalized)  Stiffness of left shoulder, not elsewhere classified     Problem List There are no active problems to display for this patient.   Myrene GalasWesley Mason Dibiasio, PT DPT 02/21/2018, 2:27 PM  [place] [facility] [address] [place], [place], [address] Phone: [phone]   Fax:  [phone]  Name: Suzanne LeberDeborah L Peeples MRN: [id number] Date of Birth: [birth year]

## 2018-02-24 ENCOUNTER — Ambulatory Visit: Payer: Medicare Other

## 2018-02-24 DIAGNOSIS — M25612 Stiffness of left shoulder, not elsewhere classified: Secondary | ICD-10-CM

## 2018-02-24 DIAGNOSIS — M25512 Pain in left shoulder: Principal | ICD-10-CM

## 2018-02-24 DIAGNOSIS — G8929 Other chronic pain: Secondary | ICD-10-CM

## 2018-02-24 DIAGNOSIS — M6281 Muscle weakness (generalized): Secondary | ICD-10-CM

## 2018-02-24 NOTE — Therapy (Signed)
[place] [facility] [address], [place], [address] Phone: [phone]   Fax:  [phone]  Physical Therapy Treatment  Patient Details  Name: Suzanne Snyder MRN: [id number] Date of Birth: 01/29/1950 Referring Provider (PT): Olga Millers MD   Encounter Date: 02/24/2018  PT End of Session - 02/24/18 1632    Visit Number  24    Number of Visits  27    Date for PT Re-Evaluation  03/07/18    Authorization Type  5/10 Medicare    PT Start Time  1330    PT Stop Time  1415    PT Time Calculation (min)  45 min    Activity Tolerance  Patient tolerated treatment well    Behavior During Therapy  [facility] for tasks assessed/performed       Past Medical History:  Diagnosis Date  . Anxiety   . Diabetes mellitus without complication (HCC)   . Osteoporosis     Past Surgical History:  Procedure Laterality Date  . 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 ROTATOR CUFF REPAIR Left 2014 and 2015  . SPINE SURGERY N/A 1984   L4-5, not sure of procedure    There were no vitals filed for this visit.  Subjective Assessment - 02/24/18 1416    Subjective  Patient reports increased shoulder pain for the past 2 weeks and states she has had decreased ability sleeping and reports she averages aroun 3 hours of sleep a night. Patient reports she continues to be stressed from her daughter and son and in law living with her and her husband.     Pertinent History  Chronic history of L shoulder pain, sciolosis, laminectomy     Limitations  Lifting;Sitting    Patient Stated Goals  To improve shoulder function and decrease pain    Currently in Pain?  Yes    Pain Score  5     Pain Location  Back   shoulder and back   Pain Orientation  Left;Medial;Right    Pain Descriptors / Indicators  Aching    Pain Type  Acute pain    Pain Onset  More than a month ago    Pain Frequency  Intermittent       -- TREATMENT Therapeutic Exercise Bridges with  bar with 10# overhead - x 20 in hooklying Backward lunges at TRX - x 10 B  Neuromuscular reeducation Educated patient on sleeping hygiene and talked about strategies to implement at home such as sleeping at consistent times, utliizing blackout curtains, and purposely making time for sleep. Also avoiding TV and phone use late at night.   PNE Intro (General Pain Knowledge): Patient introduced to the topic of pain neuroscience education and improving knowledge of how pain works to promote improved recovery and rehabilitation. Current knowledge and understanding of patient on pain related topics was explored to create a baseline.   Performed hip and shoulder-based exercises in standing to improve shoulder strengthening and bracing ability with performing functional activities such as lifting an item overhead in standing.    PT Education - 02/24/18 1631    Education Details  form/technique with exercise: educated on sleep hygiene    Person(s) Educated  Patient    Methods  Explanation;Demonstration    Comprehension  Verbalized understanding;Returned demonstration          PT Long Term Goals - 02/07/18 1057      PT LONG TERM GOAL #1   Title  Patient will improve AROM to 120 degrees left shoulder with mild discomfort for reaching into cabinet or performing hair care     Baseline  95 AROM in sitting; 09/14/2017: 105 deg; 10/28/2017: 115; 12/23/2017: 120    Time  8    Period  Weeks    Status  Achieved      PT LONG TERM GOAL #2   Title  Patient will improve all shoulder strength 4+/5 for flexion and rotation to improve ability to manipulate objects in space.     Baseline  3/5 - 3+/5 within all measured motions; 09/14/2017: 3+/5 with all motion; 10/28/2017: 3+/5 for rotational movement and flexion improvement with extension and adduction to 4-/5; 4-/5 to 3+/5 for all shoulder movements; 02/07/2018: 4-/5 rotational movement IR/ER, 4/5 for flexion and abduction    Time  8    Period  Weeks    Status   On-going      PT LONG TERM GOAL #3   Title  Patient will  be independent with home program for strength and pain control for left UE/shoulder by 04/17/2015    Baseline  limited knowledge of pain control strategies or progression of exercises for left UE; 09/14/2017: Requires moderate cueing for exercise progression; 10/28/2017: Continues to require minimal cueing to correct; 12/23/2017: continues to require moderate cueing for exercise performance; 02/07/2018: independent with HEP    Time  8    Period  Weeks    Status  Achieved      PT LONG TERM GOAL #4   Title  Patient will have a worst pain of a 3/10 to allow for performance of household activities without increased pain    Baseline  8/10; 12/23/2017: 8/10; 02/07/2018: 7/10 Both shoulder and LBP    Time  8    Period  Weeks    Status  On-going      PT LONG TERM GOAL #5   Title  Patient will be able to stand for over 5 hours without increase in pain to allow for cooking for long periods of time wihtout increase in pain    Baseline  Increased pain after 4 hours from standing; 12/23/2017: Able to perform 5 hours of cooking with increased pain; 02/07/2018: Has not been cooking secondary to family difficulties    Time  8    Period  Weeks    Status  On-going      Additional Long Term Goals   Additional Long Term Goals  Yes      PT LONG TERM GOAL #6   Title  Patient will improve her MODI to under 48% to indicate significant improvement in lower back function and improvement with cooking in standing.    Baseline  58% MODI dysfunction    Time  6    Period  Weeks    Status  New            Plan - 02/24/18 1641    Clinical Impression Statement  Performed limited exercises today as patient was having increased pain and focused today's session on performing pan science and educating on importance of sleep and how to implement into life. Patient demosntrates increased receptiveness with exercise and created an action plan for implementation.  Patient demonstrates overall improvement with exercise performance with ability to perform greater amount of reisstance and repetitions before onset of fatigue. Patient will benefit from further skilled therapy to return to prior level of function.     Rehab Potential  Fair    Clinical  Impairments Affecting Rehab Potential  (+) highly motivated, (-) age, decreased strength    PT Frequency  2x / week    PT Duration  8 weeks    PT Treatment/Interventions  Passive range of motion;Manual techniques;Electrical Stimulation;Cryotherapy;Moist Heat;Ultrasound;Therapeutic exercise;Therapeutic activities;Neuromuscular re-education;Patient/family education;Scar mobilization;Dry needling;Iontophoresis 4mg /ml Dexamethasone    PT Next Visit Plan  progress AROM exercises    PT Home Exercise Plan  See education section    Consulted and Agree with Plan of Care  Patient       Patient will benefit from skilled therapeutic intervention in order to improve the following deficits and impairments:  Pain, Impaired sensation, Decreased coordination, Decreased mobility, Increased muscle spasms, Decreased activity tolerance, Decreased endurance, Decreased range of motion, Decreased strength  Visit Diagnosis: Chronic left shoulder pain  Muscle weakness (generalized)  Stiffness of left shoulder, not elsewhere classified     Problem List There are no active problems to display for this patient.   Myrene GalasWesley Dorise Gangi, PT DPT 02/24/2018, 4:45 PM  [place] [facility] [address] [place], [place], [address] Phone: [phone]   Fax:  [phone]  Name: Suzanne Snyder MRN: [id number] Date of Birth: 05-Jan-1951

## 2018-02-28 ENCOUNTER — Ambulatory Visit: Payer: Medicare Other

## 2018-02-28 DIAGNOSIS — M25512 Pain in left shoulder: Principal | ICD-10-CM

## 2018-02-28 DIAGNOSIS — M25612 Stiffness of left shoulder, not elsewhere classified: Secondary | ICD-10-CM

## 2018-02-28 DIAGNOSIS — G8929 Other chronic pain: Secondary | ICD-10-CM

## 2018-02-28 DIAGNOSIS — M6281 Muscle weakness (generalized): Secondary | ICD-10-CM

## 2018-02-28 NOTE — Therapy (Signed)
[facility] [address], [place], [address] Phone: [phone]   Fax:  [phone]  Physical Therapy Treatment  Patient Details  Name: Suzanne Snyder MRN: [id number] Date of Birth: Aug 01, 1950 Referring Provider (PT): Olga Millers MD   Encounter Date: 02/28/2018  PT End of Session - 02/28/18 1417    Visit Number  25    Number of Visits  27    Date for PT Re-Evaluation  03/07/18    Authorization Type  6/10 Medicare    PT Start Time  1345    PT Stop Time  1430    PT Time Calculation (min)  45 min    Activity Tolerance  Patient tolerated treatment well    Behavior During Therapy  [facility] for tasks assessed/performed       Past Medical History:  Diagnosis Date  . Anxiety   . Diabetes mellitus without complication (HCC)   . Osteoporosis     Past Surgical History:  Procedure Laterality Date  . 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 ROTATOR CUFF REPAIR Left 2014 and 2015  . SPINE SURGERY N/A 1984   L4-5, not sure of procedure    There were no vitals filed for this visit.  Subjective Assessment - 02/28/18 1402    Subjective  Patient reports she has not talked to her husband about setting rules in the house. Patient states improvement in shoulder pain today. Patient states her sleeping has not been any better.    Pertinent History  Chronic history of L shoulder pain, sciolosis, laminectomy     Limitations  Lifting;Sitting    Patient Stated Goals  To improve shoulder function and decrease pain    Currently in Pain?  Yes    Pain Score  2     Pain Location  Back   shoulder   Pain Orientation  Left;Right;Medial    Pain Descriptors / Indicators  Aching    Pain Type  Acute pain    Pain Onset  More than a month ago    Pain Frequency  Intermittent       TREATMENT Therapeutic Exercise Bridges with bar with shoulder flexion 10# overhead - x 20 in hooklying Chest Press -- with B UE 20# B UE - x 10  Hip  abduction on hip machine - x 20 40# Hip extension on hip machine - x 20 85# Deadbug in hooklying - x 10 Sit to stand with arms overhead stretching - x 20 with RTB   Performed hip and shoulder-based exercises in standing to improve shoulder strengthening and bracing ability with performing functional activities such as lifting an item overhead in standing. Performed pushing and pulling based motions to improve ability   PT Education - 02/28/18 1408    Education Details  form/technique with exercise    Person(s) Educated  Patient    Methods  Demonstration;Explanation    Comprehension  Verbalized understanding;Returned demonstration          PT Long Term Goals - 02/07/18 1057      PT LONG TERM GOAL #1   Title  Patient will improve AROM to 120 degrees left shoulder with mild discomfort for reaching into cabinet or performing hair care     Baseline  95 AROM in sitting; 09/14/2017: 105 deg; 10/28/2017: 115; 12/23/2017: 120    Time  8    Period  Weeks    Status  Achieved  PT LONG TERM GOAL #2   Title  Patient will improve all shoulder strength 4+/5 for flexion and rotation to improve ability to manipulate objects in space.     Baseline  3/5 - 3+/5 within all measured motions; 09/14/2017: 3+/5 with all motion; 10/28/2017: 3+/5 for rotational movement and flexion improvement with extension and adduction to 4-/5; 4-/5 to 3+/5 for all shoulder movements; 02/07/2018: 4-/5 rotational movement IR/ER, 4/5 for flexion and abduction    Time  8    Period  Weeks    Status  On-going      PT LONG TERM GOAL #3   Title  Patient will  be independent with home program for strength and pain control for left UE/shoulder by 04/17/2015    Baseline  limited knowledge of pain control strategies or progression of exercises for left UE; 09/14/2017: Requires moderate cueing for exercise progression; 10/28/2017: Continues to require minimal cueing to correct; 12/23/2017: continues to require moderate cueing for  exercise performance; 02/07/2018: independent with HEP    Time  8    Period  Weeks    Status  Achieved      PT LONG TERM GOAL #4   Title  Patient will have a worst pain of a 3/10 to allow for performance of household activities without increased pain    Baseline  8/10; 12/23/2017: 8/10; 02/07/2018: 7/10 Both shoulder and LBP    Time  8    Period  Weeks    Status  On-going      PT LONG TERM GOAL #5   Title  Patient will be able to stand for over 5 hours without increase in pain to allow for cooking for long periods of time wihtout increase in pain    Baseline  Increased pain after 4 hours from standing; 12/23/2017: Able to perform 5 hours of cooking with increased pain; 02/07/2018: Has not been cooking secondary to family difficulties    Time  8    Period  Weeks    Status  On-going      Additional Long Term Goals   Additional Long Term Goals  Yes      PT LONG TERM GOAL #6   Title  Patient will improve her MODI to under 48% to indicate significant improvement in lower back function and improvement with cooking in standing.    Baseline  58% MODI dysfunction    Time  6    Period  Weeks    Status  New            Plan - 02/28/18 1424    Clinical Impression Statement  Patient demosntrates singificant imprortvement with exercise with ability to perform greater amount of resistance with exercise. Patient demosntrates overall improvement but continues to have difficulties with coordination lumbar movements in standing. Patient also have decreasd strength with raising her affected arm overhead and fatigues quickly. Patient will benefit from further skilled therapy to return to prior level of function.     Clinical Presentation  Unstable    Rehab Potential  Fair    Clinical Impairments Affecting Rehab Potential  (+) highly motivated, (-) age, decreased strength    PT Frequency  2x / week    PT Duration  8 weeks    PT Treatment/Interventions  Passive range of motion;Manual  techniques;Electrical Stimulation;Cryotherapy;Moist Heat;Ultrasound;Therapeutic exercise;Therapeutic activities;Neuromuscular re-education;Patient/family education;Scar mobilization;Dry needling;Iontophoresis 4mg /ml Dexamethasone    PT Next Visit Plan  progress AROM exercises    PT Home Exercise Plan  See education section  Consulted and Agree with Plan of Care  Patient       Patient will benefit from skilled therapeutic intervention in order to improve the following deficits and impairments:  Pain, Impaired sensation, Decreased coordination, Decreased mobility, Increased muscle spasms, Decreased activity tolerance, Decreased endurance, Decreased range of motion, Decreased strength  Visit Diagnosis: Chronic left shoulder pain  Muscle weakness (generalized)  Stiffness of left shoulder, not elsewhere classified     Problem List There are no active problems to display for this patient.   Myrene GalasWesley Shields Pautz, PT DPT 02/28/2018, 3:27 PM  [facility] [address] [place], [place], [address] Phone: [phone]   Fax:  [phone]  Name: Suzanne Snyder MRN: [id number] Date of Birth: 07/09/1950

## 2018-03-03 ENCOUNTER — Ambulatory Visit: Payer: Medicare Other

## 2018-03-03 DIAGNOSIS — M25512 Pain in left shoulder: Secondary | ICD-10-CM | POA: Diagnosis not present

## 2018-03-03 DIAGNOSIS — M25612 Stiffness of left shoulder, not elsewhere classified: Secondary | ICD-10-CM

## 2018-03-03 DIAGNOSIS — M6281 Muscle weakness (generalized): Secondary | ICD-10-CM

## 2018-03-03 DIAGNOSIS — G8929 Other chronic pain: Secondary | ICD-10-CM

## 2018-03-03 DIAGNOSIS — M545 Low back pain, unspecified: Secondary | ICD-10-CM

## 2018-03-03 NOTE — Therapy (Signed)
[place] [facility] [address] [place], [address] Phone: [phone]   Fax:  [phone]  Physical Therapy Treatment  Patient Details  Name: Suzanne Snyder MRN: [id number] Date of Birth: [birth year] Referring Provider (PT): Olga Millers MD   Encounter Date: 03/03/2018  PT End of Session - 03/03/18 1416    Visit Number  26    Number of Visits  27    Date for PT Re-Evaluation  03/07/18    Authorization Type  7/10 Medicare    PT Start Time  1345    PT Stop Time  1430    PT Time Calculation (min)  45 min    Activity Tolerance  Patient tolerated treatment well    Behavior During Therapy  [facility] for tasks assessed/performed       Past Medical History:  Diagnosis Date  . Anxiety   . Diabetes mellitus without complication (HCC)   . Osteoporosis     Past Surgical History:  Procedure Laterality Date  . 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 ROTATOR CUFF REPAIR Left 2014 and 2015  . SPINE SURGERY N/A 1984   L4-5, not sure of procedure    There were no vitals filed for this visit.  Subjective Assessment - 03/03/18 1359    Subjective  Patient states she has been having increased shoulder pain which has resulted from increased use of her B UEs.     Pertinent History  Chronic history of L shoulder pain, sciolosis, laminectomy     Limitations  Lifting;Sitting    Patient Stated Goals  To improve shoulder function and decrease pain    Currently in Pain?  Yes    Pain Score  4     Pain Location  Shoulder    Pain Orientation  Left;Right    Pain Descriptors / Indicators  Aching    Pain Type  Acute pain    Pain Onset  More than a month ago    Pain Frequency  Intermittent         TREATMENT Therapeutic Exercise Scapular retraction in sitting - x 30  SNAGS for thoracic spine - x30 with towel  Scapular retraction in sitting with cervical retraction - x 20 with 5 sec  Self-mobilization with LAX ball at the wall to  decrease pain and spasms - x  Thoracic rotation in sitting against isometric pressure in sitting - x 20 Scapular depression in sidelying to improve motor control and decrease pain - x 20  Manual Therapy Thoracic mobilizations PA with patient in sitting grade III-IV with downward pressures onto the shoulders - 3 x 30sec T1-5 STM performed along thoracic extensors with patient positioned in sitting to decrease pain and spasms along the affected side.  Patient demonstrates improvement in pain after performing exercises and manual therapy. All exercises performed to decrease pain and spasms.     PT Education - 03/03/18 1408    Education Details  form/technique with exercise    Person(s) Educated  Patient    Methods  Explanation;Demonstration    Comprehension  Verbalized understanding;Returned demonstration          PT Long Term Goals - 02/07/18 1057      PT LONG TERM GOAL #1   Title  Patient will improve AROM to 120 degrees left shoulder with mild discomfort for reaching into cabinet or performing hair care     Baseline  95 AROM in sitting; 09/14/2017: 105 deg;  10/28/2017: 115; 12/23/2017: 120    Time  8    Period  Weeks    Status  Achieved      PT LONG TERM GOAL #2   Title  Patient will improve all shoulder strength 4+/5 for flexion and rotation to improve ability to manipulate objects in space.     Baseline  3/5 - 3+/5 within all measured motions; 09/14/2017: 3+/5 with all motion; 10/28/2017: 3+/5 for rotational movement and flexion improvement with extension and adduction to 4-/5; 4-/5 to 3+/5 for all shoulder movements; 02/07/2018: 4-/5 rotational movement IR/ER, 4/5 for flexion and abduction    Time  8    Period  Weeks    Status  On-going      PT LONG TERM GOAL #3   Title  Patient will  be independent with home program for strength and pain control for left UE/shoulder by 04/17/2015    Baseline  limited knowledge of pain control strategies or progression of exercises for left  UE; 09/14/2017: Requires moderate cueing for exercise progression; 10/28/2017: Continues to require minimal cueing to correct; 12/23/2017: continues to require moderate cueing for exercise performance; 02/07/2018: independent with HEP    Time  8    Period  Weeks    Status  Achieved      PT LONG TERM GOAL #4   Title  Patient will have a worst pain of a 3/10 to allow for performance of household activities without increased pain    Baseline  8/10; 12/23/2017: 8/10; 02/07/2018: 7/10 Both shoulder and LBP    Time  8    Period  Weeks    Status  On-going      PT LONG TERM GOAL #5   Title  Patient will be able to stand for over 5 hours without increase in pain to allow for cooking for long periods of time wihtout increase in pain    Baseline  Increased pain after 4 hours from standing; 12/23/2017: Able to perform 5 hours of cooking with increased pain; 02/07/2018: Has not been cooking secondary to family difficulties    Time  8    Period  Weeks    Status  On-going      Additional Long Term Goals   Additional Long Term Goals  Yes      PT LONG TERM GOAL #6   Title  Patient will improve her MODI to under 48% to indicate significant improvement in lower back function and improvement with cooking in standing.    Baseline  58% MODI dysfunction    Time  6    Period  Weeks    Status  New            Plan - 03/03/18 1449    Clinical Impression Statement  Patient demonstrates improvement in pain at the end of the session compared the start with overall less guarding along the affected shoulder. She demonstrates improvement overalll in shoulder strength compared to previous sessions. Patient demosntrates continues increase in pain. Patient will benefit from further skilled therapy to return to prior level of function.     Rehab Potential  Fair    Clinical Impairments Affecting Rehab Potential  (+) highly motivated, (-) age, decreased strength    PT Frequency  2x / week    PT Duration  8 weeks    PT  Treatment/Interventions  Passive range of motion;Manual techniques;Electrical Stimulation;Cryotherapy;Moist Heat;Ultrasound;Therapeutic exercise;Therapeutic activities;Neuromuscular re-education;Patient/family education;Scar mobilization;Dry needling;Iontophoresis 4mg /ml Dexamethasone    PT Next Visit Plan  progress AROM exercises    PT Home Exercise Plan  See education section    Consulted and Agree with Plan of Care  Patient       Patient will benefit from skilled therapeutic intervention in order to improve the following deficits and impairments:  Pain, Impaired sensation, Decreased coordination, Decreased mobility, Increased muscle spasms, Decreased activity tolerance, Decreased endurance, Decreased range of motion, Decreased strength  Visit Diagnosis: Chronic left shoulder pain  Muscle weakness (generalized)  Stiffness of left shoulder, not elsewhere classified  Chronic right-sided low back pain without sciatica     Problem List There are no active problems to display for this patient.   [name], PT DPT 03/03/2018, 2:56 PM  [facility] AND SPORTS MEDICINE [address] [place], [address] Phone: [phone]   Fax:  [phone]  Name: Suzanne Snyder MRN: [id number] Date of Birth: 1950/12/16

## 2018-03-07 ENCOUNTER — Ambulatory Visit: Payer: Medicare Other

## 2018-03-10 ENCOUNTER — Ambulatory Visit: Payer: Medicare Other

## 2018-03-10 DIAGNOSIS — M6281 Muscle weakness (generalized): Secondary | ICD-10-CM

## 2018-03-10 DIAGNOSIS — M25512 Pain in left shoulder: Secondary | ICD-10-CM | POA: Diagnosis not present

## 2018-03-10 DIAGNOSIS — G8929 Other chronic pain: Secondary | ICD-10-CM

## 2018-03-10 DIAGNOSIS — M545 Low back pain: Secondary | ICD-10-CM

## 2018-03-10 DIAGNOSIS — M25612 Stiffness of left shoulder, not elsewhere classified: Secondary | ICD-10-CM

## 2018-03-10 NOTE — Addendum Note (Signed)
Addended by: Bethanie Dicker on: 03/10/2018 05:20 PM   Modules accepted: Orders

## 2018-03-10 NOTE — Therapy (Signed)
[place] [facility] [address] [place], [address] Phone: [phone]   Fax:  [phone]  Physical Therapy Treatment  Patient Details  Name: Suzanne Snyder MRN: [id number] Date of Birth: 02-15-1950 Referring Provider (PT): Olga Millers MD   Encounter Date: 03/10/2018  PT End of Session - 03/10/18 1442    Visit Number  27    Number of Visits  35    Date for PT Re-Evaluation  04/21/18    Authorization Type  8/10 Medicare    PT Start Time  1300    PT Stop Time  1345    PT Time Calculation (min)  45 min    Activity Tolerance  Patient tolerated treatment well    Behavior During Therapy  [facility] for tasks assessed/performed       Past Medical History:  Diagnosis Date  . Anxiety   . Diabetes mellitus without complication (HCC)   . Osteoporosis     Past Surgical History:  Procedure Laterality Date  . 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 ROTATOR CUFF REPAIR Left 2014 and 2015  . SPINE SURGERY N/A 1984   L4-5, not sure of procedure    There were no vitals filed for this visit.  Subjective Assessment - 03/10/18 1356    Subjective  Patint states increased pain and a electrical like sensation along the inside of the shoulder which radiates into the 4th and 5th digit on the R side.     Pertinent History  Chronic history of L shoulder pain, sciolosis, laminectomy     Limitations  Lifting;Sitting    Patient Stated Goals  To improve shoulder function and decrease pain    Currently in Pain?  Yes    Pain Score  6     Pain Location  Shoulder    Pain Orientation  Right    Pain Descriptors / Indicators  Aching    Pain Type  Acute pain    Pain Onset  More than a month ago    Pain Frequency  Intermittent       TREATMENT Therapeutic Exercise Ulnar nerve glides in sitting -- x 10  Ulnar nerve glides in supine with desensitizing -- x 10  Scapular retraction in standing and sitting -- x 10 Shoulder ER isometric  contraction -- x 10 with 5 sec holds  Shoulder ER isometric Contraction against therapist assistance -- x 10  5 sec holds   Manual therapy STM performed to shoulder ERs in sitting to decrease increased pain and spasms along the affected side to decrease pain and improve guarding on the affected side.   Performed exercises in sitting and standing to improve pain and spasms; decrease muscular guarding along the shoulder ERs. Patient required frequent cueing to perform exercises with good form and technique.                       PT Education - 03/10/18 1442    Education Details  form/technique with exercise    Person(s) Educated  Patient    Methods  Explanation;Demonstration    Comprehension  Verbalized understanding;Returned demonstration          PT Long Term Goals - 03/10/18 1444      PT LONG TERM GOAL #1   Title  Patient will improve AROM to 120 degrees left shoulder with mild discomfort for reaching into cabinet or performing hair care     Baseline  95 AROM in sitting; 09/14/2017: 105 deg; 10/28/2017: 115; 12/23/2017: 120    Time  8    Period  Weeks    Status  Achieved      PT LONG TERM GOAL #2   Title  Patient will improve all shoulder strength 4+/5 for flexion and rotation to improve ability to manipulate objects in space.     Baseline  3/5 - 3+/5 within all measured motions; 09/14/2017: 3+/5 with all motion; 10/28/2017: 3+/5 for rotational movement and flexion improvement with extension and adduction to 4-/5; 4-/5 to 3+/5 for all shoulder movements; 02/07/2018: 4-/5 rotational movement IR/ER, 4/5 for flexion and abduction; 03/10/2018: 4/5-4+/5    Time  8    Period  Weeks    Status  On-going      PT LONG TERM GOAL #3   Title  Patient will  be independent with home program for strength and pain control for left UE/shoulder by 04/17/2015    Baseline  limited knowledge of pain control strategies or progression of exercises for left UE; 09/14/2017: Requires moderate  cueing for exercise progression; 10/28/2017: Continues to require minimal cueing to correct; 12/23/2017: continues to require moderate cueing for exercise performance; 02/07/2018: independent with HEP    Time  8    Period  Weeks    Status  Achieved      PT LONG TERM GOAL #4   Title  Patient will have a worst pain of a 3/10 to allow for performance of household activities without increased pain    Baseline  8/10; 12/23/2017: 8/10; 02/07/2018: 7/10 Both shoulder and LBP; 03/10/2018: 6/10    Time  8    Period  Weeks    Status  On-going      PT LONG TERM GOAL #5   Title  Patient will be able to stand for over 5 hours without increase in pain to allow for cooking for long periods of time wihtout increase in pain    Baseline  Increased pain after 4 hours from standing; 12/23/2017: Able to perform 5 hours of cooking with increased pain; 02/07/2018: Has not been cooking secondary to family difficulties; 03/10/2018: can cook 4 hours on cooking but has increased pain at after 4 hours    Time  8    Period  Weeks    Status  On-going      PT LONG TERM GOAL #6   Title  Patient will improve her MODI to under 48% to indicate significant improvement in lower back function and improvement with cooking in standing.    Baseline  58% MODI dysfunction; 03/10/2018: 52%: MODI dysfunction    Time  6    Period  Weeks    Status  On-going            Plan - 03/10/18 1514    Clinical Impression Statement  Patient demonstrates slight improvement in back pain compared the previous progress note and continues to report decreased L shoulder pain compared to the previous sessions. However patient demosntrates increased R sided shoulder pain which is closely related to a peripheral nerve irratation along the ulnar nerve. This has been limiting use of her L UE since it continues to not yet be strong enough to perform most of functional activity.     Rehab Potential  Fair    Clinical Impairments Affecting Rehab Potential   (+) highly motivated, (-) age, decreased strength    PT Frequency  2x / week    PT Duration  8  weeks    PT Treatment/Interventions  Passive range of motion;Manual techniques;Electrical Stimulation;Cryotherapy;Moist Heat;Ultrasound;Therapeutic exercise;Therapeutic activities;Neuromuscular re-education;Patient/family education;Scar mobilization;Dry needling;Iontophoresis 4mg /ml Dexamethasone    PT Next Visit Plan  progress AROM exercises    PT Home Exercise Plan  See education section    Consulted and Agree with Plan of Care  Patient       Patient will benefit from skilled therapeutic intervention in order to improve the following deficits and impairments:  Pain, Impaired sensation, Decreased coordination, Decreased mobility, Increased muscle spasms, Decreased activity tolerance, Decreased endurance, Decreased range of motion, Decreased strength  Visit Diagnosis: Chronic left shoulder pain  Muscle weakness (generalized)  Stiffness of left shoulder, not elsewhere classified  Chronic right-sided low back pain without sciatica     Problem List There are no active problems to display for this patient.   Suzanne Snyder 03/10/2018, 4:55 PM  [place] [facility] PHYS[facility] AND SPORTS MEDICINE [address], [place], [address] Phone: [phone]   Fax:  [phone]  Name: Suzanne Snyder MRN: [id number] Date of Birth: 10/15/1950

## 2018-03-15 ENCOUNTER — Ambulatory Visit: Payer: Medicare Other | Attending: Orthopedic Surgery

## 2018-03-15 DIAGNOSIS — M25512 Pain in left shoulder: Secondary | ICD-10-CM | POA: Diagnosis not present

## 2018-03-15 DIAGNOSIS — G8929 Other chronic pain: Secondary | ICD-10-CM | POA: Diagnosis present

## 2018-03-15 DIAGNOSIS — M6281 Muscle weakness (generalized): Secondary | ICD-10-CM

## 2018-03-15 DIAGNOSIS — M25612 Stiffness of left shoulder, not elsewhere classified: Secondary | ICD-10-CM | POA: Diagnosis present

## 2018-03-15 NOTE — Therapy (Signed)
[place] [facility] [address], [place], [address] Phone: [phone]   Fax:  [phone]  Physical Therapy Treatment  Patient Details  Name: Suzanne Snyder MRN: [id number] Date of Birth: 08/13/1950 Referring Provider (PT): Olga Millers MD   Encounter Date: 03/15/2018  PT End of Session - 03/15/18 1541    Visit Number  28    Number of Visits  35    Date for PT Re-Evaluation  04/21/18    Authorization Type  2/10 Medicare    PT Start Time  1005    PT Stop Time  1030    PT Time Calculation (min)  25 min    Activity Tolerance  Patient tolerated treatment well    Behavior During Therapy  [facility] for tasks assessed/performed       Past Medical History:  Diagnosis Date  . Anxiety   . Diabetes mellitus without complication (HCC)   . Osteoporosis     Past Surgical History:  Procedure Laterality Date  . 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 ROTATOR CUFF REPAIR Left 2014 and 2015  . SPINE SURGERY N/A 1984   L4-5, not sure of procedure    There were no vitals filed for this visit.  Subjective Assessment - 03/15/18 1537    Subjective  Patient reports she continues to have increased right shoulder pain which comes in jolting motion. Patient states the pain has not improved at all since the previous session and states she went to the ER for her increase in pain. Patinet states she has increased pain with reaching.     Pertinent History  Chronic history of L shoulder pain, sciolosis, laminectomy     Limitations  Lifting;Sitting    Patient Stated Goals  To improve shoulder function and decrease pain    Currently in Pain?  Yes    Pain Score  9     Pain Location  Shoulder    Pain Orientation  Right    Pain Descriptors / Indicators  Aching;Sharp;Shooting    Pain Type  Acute pain    Pain Onset  More than a month ago    Pain Frequency  Intermittent        TREATMENT Therapeutic Exercise; Shortened session performed  today secondary to patient experiencing high resting level of pain and showing late for appointment   Progressive muscle relaxation educated to perform at home, given reasons for benefits of the intervention, and timing to perform the exercise at home  Education of role of sympathetic vs parasympthetic nervous system and their influence on pain. Reviewed an performed diaphragmatic breathing and 4 square breathing to further increase parasympathetic activation and decrease sympathetic activation  Emotions and pain Patient educated on the relationship between emotions, pain and the role of fear, catastrophization, nociception, and threat play in persistent pain, by activating the body's alarm system, resulting in hypersensitive nerves. Metaphors incorporated to foster deep learning and a paradigm shift for a patient     PT Education - 03/15/18 1540    Education Details  form/technique with exercise; HEP: Progressive muscle relaxation    Person(s) Educated  Patient    Methods  Explanation;Demonstration    Comprehension  Verbalized understanding;Returned demonstration          PT Long Term Goals - 03/10/18 1444      PT LONG TERM GOAL #1   Title  Patient will improve AROM to 120 degrees left shoulder with mild discomfort  for reaching into cabinet or performing hair care     Baseline  95 AROM in sitting; 09/14/2017: 105 deg; 10/28/2017: 115; 12/23/2017: 120    Time  8    Period  Weeks    Status  Achieved      PT LONG TERM GOAL #2   Title  Patient will improve all shoulder strength 4+/5 for flexion and rotation to improve ability to manipulate objects in space.     Baseline  3/5 - 3+/5 within all measured motions; 09/14/2017: 3+/5 with all motion; 10/28/2017: 3+/5 for rotational movement and flexion improvement with extension and adduction to 4-/5; 4-/5 to 3+/5 for all shoulder movements; 02/07/2018: 4-/5 rotational movement IR/ER, 4/5 for flexion and abduction; 03/10/2018: 4/5-4+/5    Time  8     Period  Weeks    Status  On-going      PT LONG TERM GOAL #3   Title  Patient will  be independent with home program for strength and pain control for left UE/shoulder by 04/17/2015    Baseline  limited knowledge of pain control strategies or progression of exercises for left UE; 09/14/2017: Requires moderate cueing for exercise progression; 10/28/2017: Continues to require minimal cueing to correct; 12/23/2017: continues to require moderate cueing for exercise performance; 02/07/2018: independent with HEP    Time  8    Period  Weeks    Status  Achieved      PT LONG TERM GOAL #4   Title  Patient will have a worst pain of a 3/10 to allow for performance of household activities without increased pain    Baseline  8/10; 12/23/2017: 8/10; 02/07/2018: 7/10 Both shoulder and LBP; 03/10/2018: 6/10    Time  8    Period  Weeks    Status  On-going      PT LONG TERM GOAL #5   Title  Patient will be able to stand for over 5 hours without increase in pain to allow for cooking for long periods of time wihtout increase in pain    Baseline  Increased pain after 4 hours from standing; 12/23/2017: Able to perform 5 hours of cooking with increased pain; 02/07/2018: Has not been cooking secondary to family difficulties; 03/10/2018: can cook 4 hours on cooking but has increased pain at after 4 hours    Time  8    Period  Weeks    Status  On-going      PT LONG TERM GOAL #6   Title  Patient will improve her MODI to under 48% to indicate significant improvement in lower back function and improvement with cooking in standing.    Baseline  58% MODI dysfunction; 03/10/2018: 52%: MODI dysfunction    Time  6    Period  Weeks    Status  On-going            Plan - 03/15/18 1545    Clinical Impression Statement  Focused on improving parasympathtic nervous system upregulation and decreasing sympathetic regulation during today's session to help decrease with episodes of increased pain. Patient demonstrates good  understanding at end of the session and sent home to practice progressive muscular relaxation with a free online video. Patient's progress is currently limited by increased pain and patient will benefit from further skilled therapy focused on improving limitations to return to prior level of function.     Rehab Potential  Fair    Clinical Impairments Affecting Rehab Potential  (+) highly motivated, (-) age, decreased strength  PT Frequency  2x / week    PT Duration  8 weeks    PT Treatment/Interventions  Passive range of motion;Manual techniques;Electrical Stimulation;Cryotherapy;Moist Heat;Ultrasound;Therapeutic exercise;Therapeutic activities;Neuromuscular re-education;Patient/family education;Scar mobilization;Dry needling;Iontophoresis /ml Dexamethasone    PT Next Visit Plan  progress AROM exercises    PT Home Exercise Plan  See education section    Consulted and Agree with Plan of Care  Patient       Patient will benefit from skilled therapeutic intervention in order to improve the following deficits and impairments:  Pain, Impaired sensation, Decreased coordination, Decreased mobility, Increased muscle spasms, Decreased activity tolerance, Decreased endurance, Decreased range of motion, Decreased strength  Visit Diagnosis: Chronic left shoulder pain  Muscle weakness (generalized)  Stiffness of left shoulder, not elsewhere classified     Problem List There are no active problems to display for this patient.   [name], PT DPT 03/15/2018, 3:53 PM  [facility] PHYSICAL AND SPORTS MEDICINE [address], [place], [address] Phone: [phone]   Fax:  [phone]  Name: THERESA WEDEL MRN: [id number] Date of Birth: 1950-02-05

## 2018-03-22 ENCOUNTER — Ambulatory Visit: Payer: Medicare Other

## 2018-03-22 DIAGNOSIS — M25512 Pain in left shoulder: Principal | ICD-10-CM

## 2018-03-22 DIAGNOSIS — M6281 Muscle weakness (generalized): Secondary | ICD-10-CM

## 2018-03-22 DIAGNOSIS — G8929 Other chronic pain: Secondary | ICD-10-CM

## 2018-03-22 NOTE — Therapy (Signed)
[place] [facility]ICINE [address] [place], [address] Phone: [phone]   Fax:  [phone]  Physical Therapy Treatment  Patient Details  Name: Suzanne Snyder MRN: [id number] Date of Birth: Oct 04, 1950 Referring Provider (PT): Olga Millers MD   Encounter Date: 03/22/2018  PT End of Session - 03/22/18 2256    Visit Number  29    Number of Visits  35    Date for PT Re-Evaluation  04/21/18    Authorization Type  3/10 Medicare    PT Start Time  1430    PT Stop Time  1515    PT Time Calculation (min)  45 min    Activity Tolerance  Patient tolerated treatment well    Behavior During Therapy  [facility] for tasks assessed/performed       Past Medical History:  Diagnosis Date  . Anxiety   . Diabetes mellitus without complication (HCC)   . Osteoporosis     Past Surgical History:  Procedure Laterality Date  . 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 ROTATOR CUFF REPAIR Left 2014 and 2015  . SPINE SURGERY N/A 1984   L4-5, not sure of procedure    There were no vitals filed for this visit.  Subjective Assessment - 03/22/18 2252    Subjective  Patient reports she continues to have R shoulder pain. Patient states the pain continues and is unrelenting and sometimes making her cry from pain.     Pertinent History  Chronic history of L shoulder pain, sciolosis, laminectomy     Limitations  Lifting;Sitting    Patient Stated Goals  To improve shoulder function and decrease pain    Currently in Pain?  Yes    Pain Score  8     Pain Location  Shoulder    Pain Orientation  Right    Pain Descriptors / Indicators  Aching    Pain Type  Acute pain    Pain Onset  More than a month ago    Pain Frequency  Intermittent       TREATMENT  Therapeutic Exercise  Shoulder ER in supine with shoulder abducted to 90deg -- x 20   Cervical retraction in supine -- x 10  Scapular retraction supine -- x 10  Scapular retraction at prone  -- performed for 7 min to improve muscular coordination  ULTT1 with therapist support -- x 20   Manual Therapy  STM along the scapular retractors: Rhomboids and mid trap to decrease pain and spasms along the affected side with patient positioned in prone ; scapular mobilization in prone depression and inferior rotation -- to improve mobility and decrease pain   Patient responds to therapy with a decrease in pain at the end of the session. Performed exercises to improve limitations with peripheral sesnitization   PT Education - 03/22/18 2256    Education Details  form/technique with exercise; HEP: scapular retraction    Person(s) Educated  Patient    Methods  Explanation;Demonstration    Comprehension  Returned demonstration;Verbalized understanding          PT Long Term Goals - 03/10/18 1444      PT LONG TERM GOAL #1   Title  Patient will improve AROM to 120 degrees left shoulder with mild discomfort for reaching into cabinet or performing hair care     Baseline  95 AROM in sitting; 09/14/2017: 105 deg; 10/28/2017: 115; 12/23/2017: 120    Time  8  Period  Weeks    Status  Achieved      PT LONG TERM GOAL #2   Title  Patient will improve all shoulder strength 4+/5 for flexion and rotation to improve ability to manipulate objects in space.     Baseline  3/5 - 3+/5 within all measured motions; 09/14/2017: 3+/5 with all motion; 10/28/2017: 3+/5 for rotational movement and flexion improvement with extension and adduction to 4-/5; 4-/5 to 3+/5 for all shoulder movements; 02/07/2018: 4-/5 rotational movement IR/ER, 4/5 for flexion and abduction; 03/10/2018: 4/5-4+/5    Time  8    Period  Weeks    Status  On-going      PT LONG TERM GOAL #3   Title  Patient will  be independent with home program for strength and pain control for left UE/shoulder by 04/17/2015    Baseline  limited knowledge of pain control strategies or progression of exercises for left UE; 09/14/2017: Requires moderate cueing  for exercise progression; 10/28/2017: Continues to require minimal cueing to correct; 12/23/2017: continues to require moderate cueing for exercise performance; 02/07/2018: independent with HEP    Time  8    Period  Weeks    Status  Achieved      PT LONG TERM GOAL #4   Title  Patient will have a worst pain of a 3/10 to allow for performance of household activities without increased pain    Baseline  8/10; 12/23/2017: 8/10; 02/07/2018: 7/10 Both shoulder and LBP; 03/10/2018: 6/10    Time  8    Period  Weeks    Status  On-going      PT LONG TERM GOAL #5   Title  Patient will be able to stand for over 5 hours without increase in pain to allow for cooking for long periods of time wihtout increase in pain    Baseline  Increased pain after 4 hours from standing; 12/23/2017: Able to perform 5 hours of cooking with increased pain; 02/07/2018: Has not been cooking secondary to family difficulties; 03/10/2018: can cook 4 hours on cooking but has increased pain at after 4 hours    Time  8    Period  Weeks    Status  On-going      PT LONG TERM GOAL #6   Title  Patient will improve her MODI to under 48% to indicate significant improvement in lower back function and improvement with cooking in standing.    Baseline  58% MODI dysfunction; 03/10/2018: 52%: MODI dysfunction    Time  6    Period  Weeks    Status  On-going            Plan - 03/22/18 2257    Clinical Impression Statement  Patient demosntrates improvement with pain and spasms along the R shoulder/UE after performing manual therapy along the scapular retractors on the affected side indicating decrease in nervous system response from manual input. Although patient improved, she continues to have an increase in pain and focused on performing diaphragmatic breathing to improving sympathetic NS response. Patient will benefit from further skilled therapy focused on improving limtiations to return to prior level of function.     Rehab Potential   Fair    Clinical Impairments Affecting Rehab Potential  (+) highly motivated, (-) age, decreased strength    PT Frequency  2x / week    PT Duration  8 weeks    PT Treatment/Interventions  Passive range of motion;Manual techniques;Electrical Stimulation;Cryotherapy;Moist Heat;Ultrasound;Therapeutic exercise;Therapeutic activities;Neuromuscular re-education;Patient/family education;Scar  mobilization;Dry needling;Iontophoresis 4mg /ml Dexamethasone    PT Next Visit Plan  progress AROM exercises    PT Home Exercise Plan  See education section    Consulted and Agree with Plan of Care  Patient       Patient will benefit from skilled therapeutic intervention in order to improve the following deficits and impairments:  Pain, Impaired sensation, Decreased coordination, Decreased mobility, Increased muscle spasms, Decreased activity tolerance, Decreased endurance, Decreased range of motion, Decreased strength  Visit Diagnosis: Chronic left shoulder pain  Muscle weakness (generalized)     Problem List There are no active problems to display for this patient.   [name], PT DPT 03/22/2018, 11:00 PM  [place] [facility] PHYSICAL AND SPORTS MEDICINE [address] [place], [address] Phone: [phone]   Fax:  [phone]  Name: Suzanne Snyder MRN: [id number] Date of Birth: 1950-02-09

## 2018-03-30 ENCOUNTER — Ambulatory Visit: Payer: Medicare Other

## 2018-04-06 ENCOUNTER — Ambulatory Visit: Payer: Medicare Other

## 2018-04-25 NOTE — Therapy (Signed)
[place] [facility] MAIN [facility] SERVICES [address] [place], [place], [address] Phone: [phone]   Fax:  [phone]  Patient Details  Name: Suzanne Snyder MRN: [id number] Date of Birth: 25-Oct-1950 Referring Provider:  No ref. provider found  Encounter Date: 04/25/2018  The [facility] outpatient clinics are closed at this time due to the COVID-19 epidemic. The patient was contacted in regards to their therapy services. The patient is in agreement that they are safe and consent to being on hold for therapy services until the [facility] outpatient facilities reopen. At which time, the patient will be contacted to schedule an appointment to resume therapy services.    [name], PT DPT 04/25/2018, 10:07 AM  [facility] MAIN [facility] SERVICES [address] [place], [place], [address] Phone: [phone]   Fax:  [phone]

## 2018-06-07 ENCOUNTER — Other Ambulatory Visit: Payer: Self-pay

## 2018-06-07 ENCOUNTER — Ambulatory Visit: Payer: Medicare Other | Attending: Orthopedic Surgery

## 2018-06-07 DIAGNOSIS — M25612 Stiffness of left shoulder, not elsewhere classified: Secondary | ICD-10-CM | POA: Insufficient documentation

## 2018-06-07 DIAGNOSIS — G8929 Other chronic pain: Secondary | ICD-10-CM | POA: Insufficient documentation

## 2018-06-07 DIAGNOSIS — M25512 Pain in left shoulder: Secondary | ICD-10-CM | POA: Insufficient documentation

## 2018-06-07 DIAGNOSIS — M25511 Pain in right shoulder: Secondary | ICD-10-CM | POA: Insufficient documentation

## 2018-06-07 DIAGNOSIS — M6281 Muscle weakness (generalized): Secondary | ICD-10-CM | POA: Diagnosis present

## 2018-06-07 NOTE — Therapy (Signed)
[place] [facility] PHYSICAL [facility] SPORTS MEDICINE [address] [place], [address] Phone: [phone]   Fax:  [phone]  Physical Therapy Treatment  The patient has been informed of current processes in place at Outpatient Rehab to protect patients from Covid-19 exposure including social distancing, schedule modifications, and new cleaning procedures. After discussing their particular risk with a therapist based on the patient's personal risk factors, the patient has decided to proceed with in-person therapy.   Patient Details  Name: Suzanne Snyder MRN: [id number] Date of Birth: Oct 29, 1950 Referring Provider (PT): Olga Millers MD   Encounter Date: 06/07/2018  PT End of Session - 06/07/18 1255    Visit Number  30    Number of Visits  35    Date for PT Re-Evaluation  07/19/18    Authorization Type  1/10 Medicare    PT Start Time  1100    PT Stop Time  1150    PT Time Calculation (min)  50 min    Activity Tolerance  Patient tolerated treatment well    Behavior During Therapy  [facility] for tasks assessed/performed       Past Medical History:  Diagnosis Date  . Anxiety   . Diabetes mellitus without complication (HCC)   . Osteoporosis     Past Surgical History:  Procedure Laterality Date  . 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 ROTATOR CUFF REPAIR Left 2014 and 2015  . SPINE SURGERY N/A 1984   L4-5, not sure of procedure    There were no vitals filed for this visit.  Subjective Assessment - 06/07/18 1251    Subjective  Patient states her pain has significantly been worsening since the stoppage of therapy. Patient states her shoulder B have been having increased pain and the R shoulder has had increased pain extending into the 4th and 5th digit with 'jolt-like' pain with performing elbow extension.     Pertinent History  Chronic history of L shoulder pain, sciolosis, laminectomy     Limitations  Lifting;Sitting    Patient Stated Goals  To  improve shoulder function and decrease pain    Currently in Pain?  Yes    Pain Score  8     Pain Location  Shoulder    Pain Orientation  Right    Pain Descriptors / Indicators  Aching    Pain Type  Acute pain    Pain Onset  More than a month ago    Pain Frequency  Intermittent       TREATMENT Therapeutic Exercise Shoulder Extension -- x 20 in supine Shoulder elbow flexion/ext -- x60 in supine Ulnar nerve flossing -- x 20 in supine Shoulder pulleys in sitting -- x 30 in sitting 'Okay' sign resistive pulls -- x 20 Hand squeezes against manual resistance -- x 20   Manual therapy Shoulder distraction in supine -- x 20 with 10 sec holds PROM with performing nerve glides with ulnar focus -- x 10  Patient demonstrates decreased pain at the end of the session. Performed exercises to decrease pain throughout the shoulder.        PT Education - 06/07/18 1254    Education Details  form/technique with exercise; HEP: putty finger touches, pulley    Person(s) Educated  Patient    Methods  Explanation;Demonstration    Comprehension  Verbalized understanding;Returned demonstration          PT Long Term Goals - 06/07/18 1259  PT LONG TERM GOAL #1   Title  Patient will improve AROM to 120 degrees left shoulder with mild discomfort for reaching into cabinet or performing hair care     Baseline  95 AROM in sitting; 09/14/2017: 105 deg; 10/28/2017: 115; 12/23/2017: 120    Time  8    Period  Weeks    Status  Achieved      PT LONG TERM GOAL #2   Title  Patient will improve all shoulder strength 4+/5 for flexion and rotation to improve ability to manipulate objects in space.     Baseline  3/5 - 3+/5 within all measured motions; 09/14/2017: 3+/5 with all motion; 10/28/2017: 3+/5 for rotational movement and flexion improvement with extension and adduction to 4-/5; 4-/5 to 3+/5 for all shoulder movements; 02/07/2018: 4-/5 rotational movement IR/ER, 4/5 for flexion and abduction; 03/10/2018:  4/5-4+/5; 06/07/2018: 4/5- 3/5 B    Time  8    Period  Weeks    Status  On-going      PT LONG TERM GOAL #3   Title  Patient will  be independent with home program for strength and pain control for left UE/shoulder by 04/17/2015    Baseline  limited knowledge of pain control strategies or progression of exercises for left UE; 09/14/2017: Requires moderate cueing for exercise progression; 10/28/2017: Continues to require minimal cueing to correct; 12/23/2017: continues to require moderate cueing for exercise performance; 02/07/2018: independent with HEP    Time  8    Period  Weeks    Status  Achieved      PT LONG TERM GOAL #4   Title  Patient will have a worst pain of a 3/10 to allow for performance of household activities without increased pain    Baseline  8/10; 12/23/2017: 8/10; 02/07/2018: 7/10 Both shoulder and LBP; 03/10/2018: 6/10; 06/07/2018: 10/10    Time  8    Period  Weeks    Status  On-going      PT LONG TERM GOAL #5   Title  Patient will be able to stand for over 5 hours without increase in pain to allow for cooking for long periods of time wihtout increase in pain    Baseline  Increased pain after 4 hours from standing; 12/23/2017: Able to perform 5 hours of cooking with increased pain; 02/07/2018: Has not been cooking secondary to family difficulties; 03/10/2018: can cook 4 hours on cooking but has increased pain at after 4 hours; 06/07/2018: unable to test secondary to shoulder pain    Time  8    Period  Weeks    Status  On-going      PT LONG TERM GOAL #6   Title  Patient will improve her MODI to under 48% to indicate significant improvement in lower back function and improvement with cooking in standing.    Baseline  58% MODI dysfunction; 03/10/2018: 52%: MODI dysfunction; 06/07/2018: will address NV    Time  6    Period  Weeks    Status  On-going            Plan - 06/07/18 1256    Clinical Impression Statement  Patient demonstrates improvement overall with increased pain at  the end of the session after focusing on perform mobility based exercises focus on elbow extension, distraction and deep breathing to decrease impact of sympathetic nervous system. Patient has been signifcant progress in terms of  L shoulder continues to be limited along R shoulder with indication of ulnar  nerve involvement. Patient will benefit from furhter skilled therapy to address coordination concerns and strength concerns within B UEs. Patient will benefit from further skilled therapy to return to prior level of function.     Rehab Potential  Fair    Clinical Impairments Affecting Rehab Potential  (+) highly motivated, (-) age, decreased strength    PT Frequency  2x / week    PT Duration  8 weeks    PT Treatment/Interventions  Passive range of motion;Manual techniques;Electrical Stimulation;Cryotherapy;Moist Heat;Ultrasound;Therapeutic exercise;Therapeutic activities;Neuromuscular re-education;Patient/family education;Scar mobilization;Dry needling;Iontophoresis 4mg /ml Dexamethasone    PT Next Visit Plan  progress AROM exercises    PT Home Exercise Plan  See education section    Consulted and Agree with Plan of Care  Patient       Patient will benefit from skilled therapeutic intervention in order to improve the following deficits and impairments:  Pain, Impaired sensation, Decreased coordination, Decreased mobility, Increased muscle spasms, Decreased activity tolerance, Decreased endurance, Decreased range of motion, Decreased strength  Visit Diagnosis: Chronic left shoulder pain  Muscle weakness (generalized)  Stiffness of left shoulder, not elsewhere classified  Chronic right shoulder pain     Problem List There are no active problems to display for this patient.   Myrene GalasWesley Arieh Bogue, PT DPT 06/07/2018, 1:02 PM  [facility]IC[facility] [address] [place], [place], [address] Phone: [phone]   Fax:  [phone]  Name:  Henreitta LeberDeborah L Kwasny MRN: [id number] Date of Birth: [birth year]

## 2018-06-09 ENCOUNTER — Ambulatory Visit: Payer: Medicare Other

## 2018-06-09 ENCOUNTER — Other Ambulatory Visit: Payer: Self-pay

## 2018-06-09 DIAGNOSIS — M25512 Pain in left shoulder: Secondary | ICD-10-CM | POA: Diagnosis not present

## 2018-06-09 DIAGNOSIS — M25612 Stiffness of left shoulder, not elsewhere classified: Secondary | ICD-10-CM

## 2018-06-09 DIAGNOSIS — M25511 Pain in right shoulder: Secondary | ICD-10-CM

## 2018-06-09 DIAGNOSIS — G8929 Other chronic pain: Secondary | ICD-10-CM

## 2018-06-09 DIAGNOSIS — M6281 Muscle weakness (generalized): Secondary | ICD-10-CM

## 2018-06-09 NOTE — Therapy (Signed)
[facility] [address] [place], [address] Phone: [phone]   Fax:  [phone]  Physical Therapy Treatment  Patient Details  Name: Suzanne Snyder MRN: [id number] Date of Birth: [birth year] Referring Provider (PT): Olga Millers MD   Encounter Date: 06/09/2018  PT End of Session - 06/09/18 0928    Visit Number  31    Number of Visits  38    Date for PT Re-Evaluation  07/19/18    Authorization Type  2/10 Medicare    PT Start Time  0900    PT Stop Time  0945    PT Time Calculation (min)  45 min    Activity Tolerance  Patient tolerated treatment well    Behavior During Therapy  [facility] for tasks assessed/performed       Past Medical History:  Diagnosis Date  . Anxiety   . Diabetes mellitus without complication (HCC)   . Osteoporosis     Past Surgical History:  Procedure Laterality Date  . 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 ROTATOR CUFF REPAIR Left 2014 and 2015  . SPINE SURGERY N/A 1984   L4-5, not sure of procedure    There were no vitals filed for this visit.  Subjective Assessment - 06/09/18 0904    Subjective  Patient reports decreased pain on the R UE compared to the previous session but states her L UE is hurting slightly more today versus previous sessions.     Pertinent History  Chronic history of L shoulder pain, sciolosis, laminectomy     Limitations  Lifting;Sitting    Patient Stated Goals  To improve shoulder function and decrease pain    Currently in Pain?  Yes    Pain Score  6     Pain Location  Shoulder    Pain Orientation  Right;Left    Pain Descriptors / Indicators  Aching    Pain Type  Acute pain    Pain Onset  More than a month ago    Pain Frequency  Intermittent       TREATMENT Therapeutic Exercise Serratus punches - x 20 with 2# Serratus punch with hand squeeze - x 20  Hand squeezes against manual resistance -- x 20  Shoulder circles overhead in supine - x  20 Hammer curls in supine - x 20 2# B Scapular retraction/rows -- x 20    Manual therapy PROM with performing nerve glides with ulnar and median focus -- x 20 on RUE with patient positioned in supine to decrease increased pain and spasms along ulnar nerve   Patient demonstrates decreased pain at the end of the session. Performed exercises to decrease pain throughout the shoulder.   PT Education - 06/09/18 0927    Education Details  form/technique with exercise;     Person(s) Educated  Patient    Methods  Explanation;Demonstration    Comprehension  Verbalized understanding;Returned demonstration          PT Long Term Goals - 06/07/18 1259      PT LONG TERM GOAL #1   Title  Patient will improve AROM to 120 degrees left shoulder with mild discomfort for reaching into cabinet or performing hair care     Baseline  95 AROM in sitting; 09/14/2017: 105 deg; 10/28/2017: 115; 12/23/2017: 120    Time  8    Period  Weeks    Status  Achieved      PT LONG TERM  GOAL #2   Title  Patient will improve all shoulder strength 4+/5 for flexion and rotation to improve ability to manipulate objects in space.     Baseline  3/5 - 3+/5 within all measured motions; 09/14/2017: 3+/5 with all motion; 10/28/2017: 3+/5 for rotational movement and flexion improvement with extension and adduction to 4-/5; 4-/5 to 3+/5 for all shoulder movements; 02/07/2018: 4-/5 rotational movement IR/ER, 4/5 for flexion and abduction; 03/10/2018: 4/5-4+/5; 06/07/2018: 4/5- 3/5 B    Time  8    Period  Weeks    Status  On-going      PT LONG TERM GOAL #3   Title  Patient will  be independent with home program for strength and pain control for left UE/shoulder by 04/17/2015    Baseline  limited knowledge of pain control strategies or progression of exercises for left UE; 09/14/2017: Requires moderate cueing for exercise progression; 10/28/2017: Continues to require minimal cueing to correct; 12/23/2017: continues to require moderate cueing  for exercise performance; 02/07/2018: independent with HEP    Time  8    Period  Weeks    Status  Achieved      PT LONG TERM GOAL #4   Title  Patient will have a worst pain of a 3/10 to allow for performance of household activities without increased pain    Baseline  8/10; 12/23/2017: 8/10; 02/07/2018: 7/10 Both shoulder and LBP; 03/10/2018: 6/10; 06/07/2018: 10/10    Time  8    Period  Weeks    Status  On-going      PT LONG TERM GOAL #5   Title  Patient will be able to stand for over 5 hours without increase in pain to allow for cooking for long periods of time wihtout increase in pain    Baseline  Increased pain after 4 hours from standing; 12/23/2017: Able to perform 5 hours of cooking with increased pain; 02/07/2018: Has not been cooking secondary to family difficulties; 03/10/2018: can cook 4 hours on cooking but has increased pain at after 4 hours; 06/07/2018: unable to test secondary to shoulder pain    Time  8    Period  Weeks    Status  On-going      PT LONG TERM GOAL #6   Title  Patient will improve her MODI to under 48% to indicate significant improvement in lower back function and improvement with cooking in standing.    Baseline  58% MODI dysfunction; 03/10/2018: 52%: MODI dysfunction; 06/07/2018: will address NV    Time  6    Period  Weeks    Status  On-going            Plan - 06/09/18 0933    Clinical Impression Statement  Patient demonstrates decreased pain today versus previous session indicating functional carryover between previous sessions. Although patient is improving, she continues to have increased symptoms into the 4th and 5th digits on the R side and weakness along L UE. Patient will benefit from further skilled therapy focused on improving strenth and coordination and will benefit from further skilled therapy to return to prior level of function.     Rehab Potential  Fair    Clinical Impairments Affecting Rehab Potential  (+) highly motivated, (-) age, decreased  strength    PT Frequency  2x / week    PT Duration  8 weeks    PT Treatment/Interventions  Passive range of motion;Manual techniques;Electrical Stimulation;Cryotherapy;Moist Heat;Ultrasound;Therapeutic exercise;Therapeutic activities;Neuromuscular re-education;Patient/family education;Scar mobilization;Dry needling;Iontophoresis /ml Dexamethasone  PT Next Visit Plan  progress AROM exercises    PT Home Exercise Plan  See education section    Consulted and Agree with Plan of Care  Patient       Patient will benefit from skilled therapeutic intervention in order to improve the following deficits and impairments:  Pain, Impaired sensation, Decreased coordination, Decreased mobility, Increased muscle spasms, Decreased activity tolerance, Decreased endurance, Decreased range of motion, Decreased strength  Visit Diagnosis: Chronic left shoulder pain  Muscle weakness (generalized)  Stiffness of left shoulder, not elsewhere classified  Chronic right shoulder pain     Problem List There are no active problems to display for this patient.   Myrene GalasWesley Brenley Priore, PT DPT 06/09/2018, 9:48 AM  [facility] [address] , [place], [id number] Phone: [phone]   Fax:  [phone]  Name: Henreitta LeberDeborah L Axford MRN: [id number] Date of Birth: 1951/01/02

## 2018-06-14 ENCOUNTER — Ambulatory Visit: Payer: Medicare Other | Attending: Orthopedic Surgery

## 2018-06-14 ENCOUNTER — Other Ambulatory Visit: Payer: Self-pay

## 2018-06-14 DIAGNOSIS — M25512 Pain in left shoulder: Secondary | ICD-10-CM | POA: Diagnosis present

## 2018-06-14 DIAGNOSIS — M545 Low back pain: Secondary | ICD-10-CM | POA: Diagnosis present

## 2018-06-14 DIAGNOSIS — M25612 Stiffness of left shoulder, not elsewhere classified: Secondary | ICD-10-CM | POA: Diagnosis present

## 2018-06-14 DIAGNOSIS — M6281 Muscle weakness (generalized): Secondary | ICD-10-CM | POA: Diagnosis present

## 2018-06-14 DIAGNOSIS — G8929 Other chronic pain: Secondary | ICD-10-CM | POA: Diagnosis present

## 2018-06-14 DIAGNOSIS — M25511 Pain in right shoulder: Secondary | ICD-10-CM | POA: Diagnosis present

## 2018-06-14 NOTE — Therapy (Signed)
[facility] [address], [place], [address] Phone: [phone]   Fax:  [phone]  Physical Therapy Treatment  Patient Details  Name: Suzanne Snyder MRN: [id number] Date of Birth: [birth year] Referring Provider (PT): Olga Millers MD   Encounter Date: 06/14/2018  PT End of Session - 06/14/18 1323    Visit Number  32    Number of Visits  38    Date for PT Re-Evaluation  07/19/18    Authorization Type  3/10 Medicare    PT Start Time  1300    PT Stop Time  1345    PT Time Calculation (min)  45 min    Activity Tolerance  Patient tolerated treatment well    Behavior During Therapy  [facility] for tasks assessed/performed       Past Medical History:  Diagnosis Date  . Anxiety   . Diabetes mellitus without complication (HCC)   . Osteoporosis     Past Surgical History:  Procedure Laterality Date  . 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 ROTATOR CUFF REPAIR Left 2014 and 2015  . SPINE SURGERY N/A 1984   L4-5, not sure of procedure    There were no vitals filed for this visit.  Subjective Assessment - 06/14/18 1303    Subjective  Patient reports the pain on the R shoulder has been decreased overall with the nerve pain however continues to have episodic pain.     Pertinent History  Chronic history of L shoulder pain, sciolosis, laminectomy     Limitations  Lifting;Sitting    Patient Stated Goals  To improve shoulder function and decrease pain    Currently in Pain?  Yes    Pain Score  6     Pain Location  Shoulder    Pain Orientation  Right;Left    Pain Descriptors / Indicators  Aching    Pain Type  Acute pain;Chronic pain    Pain Onset  More than a month ago    Pain Frequency  Intermittent         TREATMENT Therapeutic Exercise Pulleys in sitting - 5 min Shoulder flexion with a 1# db to second shelf - x 12 L Shoulder flexion with a 1 # db to the third shelf - x 12 R Shoulder abduction with 1# db to  the second shelf - x 12 L Shoulder abduction with 1# db to the second shelf - x 12 R Lat pull down with RTB - x 20 Scapular protaction with plus with RTB - x 10   Manual therapy STM performed along the scapular musculature on the L side to decrease increased pain and spasms along the affected side with patient in sitting. - x15   Patient demonstrates decreased pain at the end of the session. Performed exercises to decrease pain throughout the shoulder.   PT Education - 06/14/18 1307    Education Details  form/technique with exercise;     Person(s) Educated  Patient    Methods  Explanation;Demonstration    Comprehension  Verbalized understanding;Returned demonstration          PT Long Term Goals - 06/07/18 1259      PT LONG TERM GOAL #1   Title  Patient will improve AROM to 120 degrees left shoulder with mild discomfort for reaching into cabinet or performing hair care     Baseline  95 AROM in sitting; 09/14/2017: 105 deg; 10/28/2017: 115; 12/23/2017: 120  Time  8    Period  Weeks    Status  Achieved      PT LONG TERM GOAL #2   Title  Patient will improve all shoulder strength 4+/5 for flexion and rotation to improve ability to manipulate objects in space.     Baseline  3/5 - 3+/5 within all measured motions; 09/14/2017: 3+/5 with all motion; 10/28/2017: 3+/5 for rotational movement and flexion improvement with extension and adduction to 4-/5; 4-/5 to 3+/5 for all shoulder movements; 02/07/2018: 4-/5 rotational movement IR/ER, 4/5 for flexion and abduction; 03/10/2018: 4/5-4+/5; 06/07/2018: 4/5- 3/5 B    Time  8    Period  Weeks    Status  On-going      PT LONG TERM GOAL #3   Title  Patient will  be independent with home program for strength and pain control for left UE/shoulder by 04/17/2015    Baseline  limited knowledge of pain control strategies or progression of exercises for left UE; 09/14/2017: Requires moderate cueing for exercise progression; 10/28/2017: Continues to require  minimal cueing to correct; 12/23/2017: continues to require moderate cueing for exercise performance; 02/07/2018: independent with HEP    Time  8    Period  Weeks    Status  Achieved      PT LONG TERM GOAL #4   Title  Patient will have a worst pain of a 3/10 to allow for performance of household activities without increased pain    Baseline  8/10; 12/23/2017: 8/10; 02/07/2018: 7/10 Both shoulder and LBP; 03/10/2018: 6/10; 06/07/2018: 10/10    Time  8    Period  Weeks    Status  On-going      PT LONG TERM GOAL #5   Title  Patient will be able to stand for over 5 hours without increase in pain to allow for cooking for long periods of time wihtout increase in pain    Baseline  Increased pain after 4 hours from standing; 12/23/2017: Able to perform 5 hours of cooking with increased pain; 02/07/2018: Has not been cooking secondary to family difficulties; 03/10/2018: can cook 4 hours on cooking but has increased pain at after 4 hours; 06/07/2018: unable to test secondary to shoulder pain    Time  8    Period  Weeks    Status  On-going      PT LONG TERM GOAL #6   Title  Patient will improve her MODI to under 48% to indicate significant improvement in lower back function and improvement with cooking in standing.    Baseline  58% MODI dysfunction; 03/10/2018: 52%: MODI dysfunction; 06/07/2018: will address NV    Time  6    Period  Weeks    Status  On-going            Plan - 06/14/18 1352    Clinical Impression Statement  Patient demonstrates improvement overall with exercises with ability to perform greater amount of exercises compared to previous session most notably with the L shoulder. Patient was able to perform greater amount of L shoulder flexion most notablywith flexion and abduction with a 1# weight. Patient also demosntrates overall less N/T with the R shoulder with ability to reach through full AROM without onset of symptoms. Patient continues to demonstrate significant weakness and will  benefit from further skilled therapy to return to prior level of function    Rehab Potential  Fair    Clinical Impairments Affecting Rehab Potential  (+) highly motivated, (-) age,  decreased strength    PT Frequency  2x / week    PT Duration  8 weeks    PT Treatment/Interventions  Passive range of motion;Manual techniques;Electrical Stimulation;Cryotherapy;Moist Heat;Ultrasound;Therapeutic exercise;Therapeutic activities;Neuromuscular re-education;Patient/family education;Scar mobilization;Dry needling;Iontophoresis 4mg /ml Dexamethasone    PT Next Visit Plan  progress AROM exercises    PT Home Exercise Plan  See education section    Consulted and Agree with Plan of Care  Patient       Patient will benefit from skilled therapeutic intervention in order to improve the following deficits and impairments:  Pain, Impaired sensation, Decreased coordination, Decreased mobility, Increased muscle spasms, Decreased activity tolerance, Decreased endurance, Decreased range of motion, Decreased strength  Visit Diagnosis: Chronic left shoulder pain  Muscle weakness (generalized)  Stiffness of left shoulder, not elsewhere classified     Problem List There are no active problems to display for this patient.   [name], PT DPT 06/14/2018, 1:56 PM  [facility] [address], [place], [address] Phone: [phone]   Fax:  [phone]  Name: LORINDA SARAGOZA MRN: [id number] Date of Birth: [birth year]

## 2018-06-16 ENCOUNTER — Ambulatory Visit: Payer: Medicare Other

## 2018-06-16 ENCOUNTER — Other Ambulatory Visit: Payer: Self-pay

## 2018-06-16 DIAGNOSIS — M25612 Stiffness of left shoulder, not elsewhere classified: Secondary | ICD-10-CM

## 2018-06-16 DIAGNOSIS — M25512 Pain in left shoulder: Secondary | ICD-10-CM | POA: Diagnosis not present

## 2018-06-16 DIAGNOSIS — G8929 Other chronic pain: Secondary | ICD-10-CM

## 2018-06-16 DIAGNOSIS — M6281 Muscle weakness (generalized): Secondary | ICD-10-CM

## 2018-06-16 NOTE — Therapy (Signed)
[place] [facility] [address], [place], [address] Phone: [phone]   Fax:  [phone]  Physical Therapy Treatment  Patient Details  Name: Suzanne Snyder MRN: [id number] Date of Birth: 24-Apr-1950 Referring Provider (PT): Olga Millers MD   Encounter Date: 06/16/2018  PT End of Session - 06/16/18 1310    Visit Number  33    Number of Visits  38    Date for PT Re-Evaluation  07/19/18    Authorization Type  4/10 Medicare    PT Start Time  1300    PT Stop Time  1345    PT Time Calculation (min)  45 min    Activity Tolerance  Patient tolerated treatment well    Behavior During Therapy  [facility] for tasks assessed/performed       Past Medical History:  Diagnosis Date  . Anxiety   . Diabetes mellitus without complication (HCC)   . Osteoporosis     Past Surgical History:  Procedure Laterality Date  . 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 ROTATOR CUFF REPAIR Left 2014 and 2015  . SPINE SURGERY N/A 1984   L4-5, not sure of procedure    There were no vitals filed for this visit.  Subjective Assessment - 06/16/18 1259    Subjective  Patient reports she did not sleep well last night and states her L shoulder and hands are sore from the previous treatment.     Pertinent History  Chronic history of L shoulder pain, sciolosis, laminectomy     Limitations  Lifting;Sitting    Patient Stated Goals  To improve shoulder function and decrease pain    Currently in Pain?  Yes    Pain Score  6     Pain Location  Shoulder    Pain Orientation  Right;Left    Pain Descriptors / Indicators  Aching    Pain Type  Chronic pain    Pain Onset  More than a month ago    Pain Frequency  Intermittent       TREATMENT Therapeutic Exercise Pulleys in sitting - 7 min flexion, abduction  Shoulder flexion on the L side - x15  Scapular retraction in sitting - x 15 Scapular protaction with therapist support- x 10     Manual  therapy STM performed along the scapular musculature on the L side to decrease increased pain and spasms along the affected side with patient in sitting. - x31min   Patient demonstrates decreased pain at the end of the session. Performed exercises to decrease pain throughout the shoulder.   PT Education - 06/16/18 1308    Education Details  form/technique with exercise;     Person(s) Educated  Patient    Methods  Explanation;Demonstration    Comprehension  Verbalized understanding;Returned demonstration          PT Long Term Goals - 06/07/18 1259      PT LONG TERM GOAL #1   Title  Patient will improve AROM to 120 degrees left shoulder with mild discomfort for reaching into cabinet or performing hair care     Baseline  95 AROM in sitting; 09/14/2017: 105 deg; 10/28/2017: 115; 12/23/2017: 120    Time  8    Period  Weeks    Status  Achieved      PT LONG TERM GOAL #2   Title  Patient will improve all shoulder strength 4+/5 for flexion and rotation to improve ability to  manipulate objects in space.     Baseline  3/5 - 3+/5 within all measured motions; 09/14/2017: 3+/5 with all motion; 10/28/2017: 3+/5 for rotational movement and flexion improvement with extension and adduction to 4-/5; 4-/5 to 3+/5 for all shoulder movements; 02/07/2018: 4-/5 rotational movement IR/ER, 4/5 for flexion and abduction; 03/10/2018: 4/5-4+/5; 06/07/2018: 4/5- 3/5 B    Time  8    Period  Weeks    Status  On-going      PT LONG TERM GOAL #3   Title  Patient will  be independent with home program for strength and pain control for left UE/shoulder by 04/17/2015    Baseline  limited knowledge of pain control strategies or progression of exercises for left UE; 09/14/2017: Requires moderate cueing for exercise progression; 10/28/2017: Continues to require minimal cueing to correct; 12/23/2017: continues to require moderate cueing for exercise performance; 02/07/2018: independent with HEP    Time  8    Period  Weeks    Status   Achieved      PT LONG TERM GOAL #4   Title  Patient will have a worst pain of a 3/10 to allow for performance of household activities without increased pain    Baseline  8/10; 12/23/2017: 8/10; 02/07/2018: 7/10 Both shoulder and LBP; 03/10/2018: 6/10; 06/07/2018: 10/10    Time  8    Period  Weeks    Status  On-going      PT LONG TERM GOAL #5   Title  Patient will be able to stand for over 5 hours without increase in pain to allow for cooking for long periods of time wihtout increase in pain    Baseline  Increased pain after 4 hours from standing; 12/23/2017: Able to perform 5 hours of cooking with increased pain; 02/07/2018: Has not been cooking secondary to family difficulties; 03/10/2018: can cook 4 hours on cooking but has increased pain at after 4 hours; 06/07/2018: unable to test secondary to shoulder pain    Time  8    Period  Weeks    Status  On-going      PT LONG TERM GOAL #6   Title  Patient will improve her MODI to under 48% to indicate significant improvement in lower back function and improvement with cooking in standing.    Baseline  58% MODI dysfunction; 03/10/2018: 52%: MODI dysfunction; 06/07/2018: will address NV    Time  6    Period  Weeks    Status  On-going            Plan - 06/16/18 1348    Clinical Impression Statement  Patient demonstrates increased soreness during today's session limiting performance of resistance based exercises. With performing an exercise of scapular protaction, we had to stop the exercise secondary to patient becoming emotional about her shoulder not being able to be as functional as she would like it. Educated patient on normalcy of soreness after performing exercises and although she most likely will gain full shoulder AROM (Secondary to reverse total shoulder) that the shoulder will be functional and requires strengthening for continue to make functional gains. Patient will benefit from furhter skilled therapy to return to prior level of  function.     Rehab Potential  Fair    Clinical Impairments Affecting Rehab Potential  (+) highly motivated, (-) age, decreased strength    PT Frequency  2x / week    PT Duration  8 weeks    PT Treatment/Interventions  Passive range of motion;Manual  techniques;Electrical Stimulation;Cryotherapy;Moist Heat;Ultrasound;Therapeutic exercise;Therapeutic activities;Neuromuscular re-education;Patient/family education;Scar mobilization;Dry needling;Iontophoresis 4mg /ml Dexamethasone    PT Next Visit Plan  progress AROM exercises    PT Home Exercise Plan  See education section    Consulted and Agree with Plan of Care  Patient       Patient will benefit from skilled therapeutic intervention in order to improve the following deficits and impairments:  Pain, Impaired sensation, Decreased coordination, Decreased mobility, Increased muscle spasms, Decreased activity tolerance, Decreased endurance, Decreased range of motion, Decreased strength  Visit Diagnosis: Chronic left shoulder pain  Muscle weakness (generalized)  Stiffness of left shoulder, not elsewhere classified     Problem List There are no active problems to display for this patient.   Myrene GalasWesley Herta Hink, PT DPT 06/16/2018, 1:53 PM  [facility] [facility] [address] [place], [place], [address] Phone: [phone]   Fax:  [phone]  Name: Suzanne Snyder MRN: [id number] Date of Birth: 04/01/1950

## 2018-06-20 ENCOUNTER — Ambulatory Visit: Payer: Medicare Other

## 2018-06-23 ENCOUNTER — Ambulatory Visit: Payer: Medicare Other

## 2018-06-23 ENCOUNTER — Other Ambulatory Visit: Payer: Self-pay

## 2018-06-23 DIAGNOSIS — G8929 Other chronic pain: Secondary | ICD-10-CM

## 2018-06-23 DIAGNOSIS — M6281 Muscle weakness (generalized): Secondary | ICD-10-CM

## 2018-06-23 DIAGNOSIS — M25511 Pain in right shoulder: Secondary | ICD-10-CM

## 2018-06-23 DIAGNOSIS — M25612 Stiffness of left shoulder, not elsewhere classified: Secondary | ICD-10-CM

## 2018-06-23 DIAGNOSIS — M25512 Pain in left shoulder: Secondary | ICD-10-CM

## 2018-06-23 NOTE — Therapy (Signed)
[place] [facility] [address] [place], [address] Phone: [phone]   Fax:  [phone]  Physical Therapy Treatment  Patient Details  Name: Suzanne Snyder MRN: [id number] Date of Birth: 07-12-1950 Referring Provider (PT): Kathyrn Sheriff MD   Encounter Date: 06/23/2018  PT End of Session - 06/23/18 1453    Visit Number  33    Number of Visits  38    Date for PT Re-Evaluation  07/19/18    Authorization Type  5/10 Medicare    PT Start Time  1400    PT Stop Time  1445    PT Time Calculation (min)  45 min    Activity Tolerance  Patient tolerated treatment well    Behavior During Therapy  [facility] for tasks assessed/performed       Past Medical History:  Diagnosis Date  . Anxiety   . Diabetes mellitus without complication ([place])   . Osteoporosis     Past Surgical History:  Procedure Laterality Date  . 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 ROTATOR CUFF REPAIR Left 2014 and 2015  . SPINE SURGERY N/A 1984   L4-5, not sure of procedure    There were no vitals filed for this visit.  Subjective Assessment - 06/23/18 1408    Subjective  Patient reports she fell out of her bed and hit her L shoulder on a chair which has resulted in pain. Patient states his baseline pain is a 6/10 on the L shoulder. Patient states the pain is unbearable when lying down and sitting up as well.    Pertinent History  Chronic history of L shoulder pain, sciolosis, laminectomy     Limitations  Lifting;Sitting    Patient Stated Goals  To improve shoulder function and decrease pain    Currently in Pain?  Yes    Pain Score  6     Pain Location  Shoulder    Pain Orientation  Left    Pain Descriptors / Indicators  Aching    Pain Type  Chronic pain;Surgical pain    Pain Onset  More than a month ago    Pain Frequency  Intermittent         TREATMENT Therapeutic Exercise: Shoulder flexion with therapist support  -- x 15 Shoulder punches  with therapist support -- x10 Scapular retraction in sitting -- x 15 Finger tapping alternating fingers -- x 15 Isometric hand grip holds in standing -- x 3 B  Hand grip strength on R: ~40#, on L: ~#55  Manual therapy: STM performed to the UT and scapular retractors in sitting to decrease increased pain and spasms with patient positioned in sitting. Decreased pain noted after performing. -- x42min  PT Education - 06/23/18 1452    Education Details  form/technique with exercise; educated on performing UT stretch and general mobility stretches    Person(s) Educated  Patient    Methods  Explanation;Demonstration    Comprehension  Verbalized understanding;Returned demonstration          PT Long Term Goals - 06/07/18 1259      PT LONG TERM GOAL #1   Title  Patient will improve AROM to 120 degrees left shoulder with mild discomfort for reaching into cabinet or performing hair care     Baseline  95 AROM in sitting; 09/14/2017: 105 deg; 10/28/2017: 115; 12/23/2017: 120    Time  8    Period  Weeks    Status  Achieved      PT LONG TERM GOAL #2   Title  Patient will improve all shoulder strength 4+/5 for flexion and rotation to improve ability to manipulate objects in space.     Baseline  3/5 - 3+/5 within all measured motions; 09/14/2017: 3+/5 with all motion; 10/28/2017: 3+/5 for rotational movement and flexion improvement with extension and adduction to 4-/5; 4-/5 to 3+/5 for all shoulder movements; 02/07/2018: 4-/5 rotational movement IR/ER, 4/5 for flexion and abduction; 03/10/2018: 4/5-4+/5; 06/07/2018: 4/5- 3/5 B    Time  8    Period  Weeks    Status  On-going      PT LONG TERM GOAL #3   Title  Patient will  be independent with home program for strength and pain control for left UE/shoulder by 04/17/2015    Baseline  limited knowledge of pain control strategies or progression of exercises for left UE; 09/14/2017: Requires moderate cueing for exercise progression; 10/28/2017: Continues to  require minimal cueing to correct; 12/23/2017: continues to require moderate cueing for exercise performance; 02/07/2018: independent with HEP    Time  8    Period  Weeks    Status  Achieved      PT LONG TERM GOAL #4   Title  Patient will have a worst pain of a 3/10 to allow for performance of household activities without increased pain    Baseline  8/10; 12/23/2017: 8/10; 02/07/2018: 7/10 Both shoulder and LBP; 03/10/2018: 6/10; 06/07/2018: 10/10    Time  8    Period  Weeks    Status  On-going      PT LONG TERM GOAL #5   Title  Patient will be able to stand for over 5 hours without increase in pain to allow for cooking for long periods of time wihtout increase in pain    Baseline  Increased pain after 4 hours from standing; 12/23/2017: Able to perform 5 hours of cooking with increased pain; 02/07/2018: Has not been cooking secondary to family difficulties; 03/10/2018: can cook 4 hours on cooking but has increased pain at after 4 hours; 06/07/2018: unable to test secondary to shoulder pain    Time  8    Period  Weeks    Status  On-going      PT LONG TERM GOAL #6   Title  Patient will improve her MODI to under 48% to indicate significant improvement in lower back function and improvement with cooking in standing.    Baseline  58% MODI dysfunction; 03/10/2018: 52%: MODI dysfunction; 06/07/2018: will address NV    Time  6    Period  Weeks    Status  On-going            Plan - 06/23/18 1454    Clinical Impression Statement  Patient demonstrates increased difficulty with raising her arm overhead on the L especially with the lowering motion of overhead shoulder flexion indicating increased difficulty with eccentric loading. Focused on performing greater amount of manual therapy secondary to increased pain and spamss. Patient will benefit from further skilled therapy to retun to prior level of function.    Rehab Potential  Fair    Clinical Impairments Affecting Rehab Potential  (+) highly  motivated, (-) age, decreased strength    PT Frequency  2x / week    PT Duration  8 weeks    PT Treatment/Interventions  Passive range of motion;Manual techniques;Electrical Stimulation;Cryotherapy;Moist Heat;Ultrasound;Therapeutic exercise;Therapeutic activities;Neuromuscular re-education;Patient/family education;Scar mobilization;Dry needling;Iontophoresis 4mg /ml Dexamethasone    PT  Next Visit Plan  progress AROM exercises    PT Home Exercise Plan  See education section    Consulted and Agree with Plan of Care  Patient       Patient will benefit from skilled therapeutic intervention in order to improve the following deficits and impairments:  Pain, Impaired sensation, Decreased coordination, Decreased mobility, Increased muscle spasms, Decreased activity tolerance, Decreased endurance, Decreased range of motion, Decreased strength  Visit Diagnosis: Chronic left shoulder pain  Muscle weakness (generalized)  Stiffness of left shoulder, not elsewhere classified  Chronic right shoulder pain     Problem List There are no active problems to display for this patient.   Myrene GalasWesley Chaka Boyson, PT DPT 06/23/2018, 2:57 PM  [facility] [address] [place], [place], [address] Phone: [phone]   Fax:  [phone]  Name: Suzanne Snyder MRN: [id number] Date of Birth: 01-31-1950

## 2018-06-27 ENCOUNTER — Other Ambulatory Visit: Payer: Self-pay

## 2018-06-27 ENCOUNTER — Ambulatory Visit: Payer: Medicare Other

## 2018-06-27 DIAGNOSIS — M25612 Stiffness of left shoulder, not elsewhere classified: Secondary | ICD-10-CM

## 2018-06-27 DIAGNOSIS — M25512 Pain in left shoulder: Secondary | ICD-10-CM | POA: Diagnosis not present

## 2018-06-27 DIAGNOSIS — G8929 Other chronic pain: Secondary | ICD-10-CM

## 2018-06-27 DIAGNOSIS — M6281 Muscle weakness (generalized): Secondary | ICD-10-CM

## 2018-06-27 NOTE — Therapy (Signed)
[place] [facility] [address], [place], [address] Phone: [phone]   Fax:  [phone]  Physical Therapy Treatment  Patient Details  Name: Suzanne Snyder MRN: [id number] Date of Birth: 1950/09/03 Referring Provider (PT): Kathyrn Sheriff MD   Encounter Date: 06/27/2018  PT End of Session - 06/27/18 1224    Visit Number  34    Number of Visits  38    Date for PT Re-Evaluation  07/19/18    Authorization Type  6/10 Medicare    PT Start Time  1110    PT Stop Time  1150    PT Time Calculation (min)  40 min    Activity Tolerance  Patient tolerated treatment well    Behavior During Therapy  [facility] for tasks assessed/performed       Past Medical History:  Diagnosis Date  . Anxiety   . Diabetes mellitus without complication ([employer])   . Osteoporosis     Past Surgical History:  Procedure Laterality Date  . 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 ROTATOR CUFF REPAIR Left 2014 and 2015  . SPINE SURGERY N/A 1984   L4-5, not sure of procedure    There were no vitals filed for this visit.  Subjective Assessment - 06/27/18 1158    Subjective  Patient reports she did not sleep well. Patient states she got in a fight with her husband and started to be emotioally upset. Patient states improvement with ability to perform shoulder abduction.    Pertinent History  Chronic history of L shoulder pain, sciolosis, laminectomy     Limitations  Lifting;Sitting    Patient Stated Goals  To improve shoulder function and decrease pain    Currently in Pain?  Yes    Pain Score  6     Pain Location  Shoulder    Pain Orientation  Right;Left    Pain Descriptors / Indicators  Aching    Pain Type  Chronic pain    Pain Onset  More than a month ago       TREATMENT Therapeutic Exercise Shoulder Abduction in standing overhead -- x 3 with focus on performing smoothly Shoulder pulleys in sitting to improve AROM and stretching along the  shoulder -- x 7 min Scapular retraction -- x 2 with patient in sitting   Home Management Educated on potential changes to household and educated on activities to perform outside of her house such as becoming socially involved with a group.Educated on the potential of volunteering to improve patient's adverse response to home life. Educated on potentially reaching out to psychologist to better improve emotional responses. Addressed limitations in home management by focusing on performing motions outside of the bedroom whenever possible to improve association of the bedroom with sleep.   Performed exercises and home management to help patient better address issues at home.        PT Education - 06/27/18 1224    Education Details  form/technique with exercise; education on becoming more socially involved outside of the home.    Person(s) Educated  Patient    Methods  Demonstration;Explanation    Comprehension  Verbalized understanding;Returned demonstration          PT Long Term Goals - 06/07/18 1259      PT LONG TERM GOAL #1   Title  Patient will improve AROM to 120 degrees left shoulder with mild discomfort for reaching into cabinet or performing hair care  Baseline  95 AROM in sitting; 09/14/2017: 105 deg; 10/28/2017: 115; 12/23/2017: 120    Time  8    Period  Weeks    Status  Achieved      PT LONG TERM GOAL #2   Title  Patient will improve all shoulder strength 4+/5 for flexion and rotation to improve ability to manipulate objects in space.     Baseline  3/5 - 3+/5 within all measured motions; 09/14/2017: 3+/5 with all motion; 10/28/2017: 3+/5 for rotational movement and flexion improvement with extension and adduction to 4-/5; 4-/5 to 3+/5 for all shoulder movements; 02/07/2018: 4-/5 rotational movement IR/ER, 4/5 for flexion and abduction; 03/10/2018: 4/5-4+/5; 06/07/2018: 4/5- 3/5 B    Time  8    Period  Weeks    Status  On-going      PT LONG TERM GOAL #3   Title  Patient  will  be independent with home program for strength and pain control for left UE/shoulder by 04/17/2015    Baseline  limited knowledge of pain control strategies or progression of exercises for left UE; 09/14/2017: Requires moderate cueing for exercise progression; 10/28/2017: Continues to require minimal cueing to correct; 12/23/2017: continues to require moderate cueing for exercise performance; 02/07/2018: independent with HEP    Time  8    Period  Weeks    Status  Achieved      PT LONG TERM GOAL #4   Title  Patient will have a worst pain of a 3/10 to allow for performance of household activities without increased pain    Baseline  8/10; 12/23/2017: 8/10; 02/07/2018: 7/10 Both shoulder and LBP; 03/10/2018: 6/10; 06/07/2018: 10/10    Time  8    Period  Weeks    Status  On-going      PT LONG TERM GOAL #5   Title  Patient will be able to stand for over 5 hours without increase in pain to allow for cooking for long periods of time wihtout increase in pain    Baseline  Increased pain after 4 hours from standing; 12/23/2017: Able to perform 5 hours of cooking with increased pain; 02/07/2018: Has not been cooking secondary to family difficulties; 03/10/2018: can cook 4 hours on cooking but has increased pain at after 4 hours; 06/07/2018: unable to test secondary to shoulder pain    Time  8    Period  Weeks    Status  On-going      PT LONG TERM GOAL #6   Title  Patient will improve her MODI to under 48% to indicate significant improvement in lower back function and improvement with cooking in standing.    Baseline  58% MODI dysfunction; 03/10/2018: 52%: MODI dysfunction; 06/07/2018: will address NV    Time  6    Period  Weeks    Status  On-going            Plan - 06/27/18 1225    Clinical Impression Statement  Patient demonstrates being emotionally upset. Patient demonstrates improvement with shoulder abduction compared to previous sessions with ability to perform throughout full AROM with only  minimal pain with shoulder abduction at 90 degrees. Patient demonstrates decreased difficulty with shoulder abduction in standing after educating on importance of further social interaction. Patient will benefit from further skilled therapy to return to prior level of function.    Rehab Potential  Fair    Clinical Impairments Affecting Rehab Potential  (+) highly motivated, (-) age, decreased strength    PT  Frequency  2x / week    PT Duration  8 weeks    PT Treatment/Interventions  Passive range of motion;Manual techniques;Electrical Stimulation;Cryotherapy;Moist Heat;Ultrasound;Therapeutic exercise;Therapeutic activities;Neuromuscular re-education;Patient/family education;Scar mobilization;Dry needling;Iontophoresis 4mg /ml Dexamethasone    PT Next Visit Plan  progress AROM exercises    PT Home Exercise Plan  See education section    Consulted and Agree with Plan of Care  Patient       Patient will benefit from skilled therapeutic intervention in order to improve the following deficits and impairments:  Pain, Impaired sensation, Decreased coordination, Decreased mobility, Increased muscle spasms, Decreased activity tolerance, Decreased endurance, Decreased range of motion, Decreased strength  Visit Diagnosis: Chronic left shoulder pain  Muscle weakness (generalized)  Stiffness of left shoulder, not elsewhere classified     Problem List There are no active problems to display for this patient.   Myrene GalasWesley Beauty Pless, PT DPT 06/27/2018, 12:54 PM  [facility] [address] [place], [place], [address] Phone: [phone]   Fax:  [phone]  Name: Suzanne Snyder MRN: [id number] Date of Birth: 02/07/1950

## 2018-06-29 ENCOUNTER — Other Ambulatory Visit: Payer: Self-pay

## 2018-06-29 ENCOUNTER — Ambulatory Visit: Payer: Medicare Other

## 2018-06-29 DIAGNOSIS — M25512 Pain in left shoulder: Secondary | ICD-10-CM | POA: Diagnosis not present

## 2018-06-29 DIAGNOSIS — M6281 Muscle weakness (generalized): Secondary | ICD-10-CM

## 2018-06-29 DIAGNOSIS — G8929 Other chronic pain: Secondary | ICD-10-CM

## 2018-06-29 DIAGNOSIS — M25612 Stiffness of left shoulder, not elsewhere classified: Secondary | ICD-10-CM

## 2018-06-29 NOTE — Therapy (Signed)
[facility] [address] [place], [place], [address] Phone: [phone]   Fax:  [phone]  Physical Therapy Treatment  Patient Details  Name: Suzanne Snyder MRN: [id number] Date of Birth: 25-Jul-1950 Referring Provider (PT): Olga MillersKamath MD   Encounter Date: 06/29/2018  PT End of Session - 06/29/18 1117    Visit Number  35    Number of Visits  38    Date for PT Re-Evaluation  07/19/18    Authorization Type  7/10 Medicare    PT Start Time  1105    PT Stop Time  1150    PT Time Calculation (min)  45 min    Activity Tolerance  Patient tolerated treatment well    Behavior During Therapy  [facility] assessed/per[facility]       Past Medical History:  Diagnosis Date  . Anxiety   . Diabetes mellitus without complication ([facility])   . Osteoporosis     Past Surgical History:  Procedure Laterality Date  . 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 ROTATOR CUFF REPAIR Left 2014 and 2015  . SPINE SURGERY N/A 1984   L4-5, not sure of procedure    There were no vitals filed for this visit.  Subjective Assessment - 06/29/18 1108    Subjective  Patient reports she has been practicing strenthening her R hand with twirling things inbetween her fingers and pulling out test strips for her blood sugar. Patient reports her L shoulder is still sore form a fall the other day.    Pertinent History  Chronic history of L shoulder pain, sciolosis, laminectomy     Limitations  Lifting;Sitting    Patient Stated Goals  To improve shoulder function and decrease pain    Currently in Pain?  Yes    Pain Score  6     Pain Location  Shoulder    Pain Orientation  Right;Left    Pain Descriptors / Indicators  Aching    Pain Type  Chronic pain    Pain Onset  More than a month ago    Pain Frequency  Intermittent         TREATMENT Therapeutic Exercise UE ranger performing shoulder ER - x 20 B Shoulder pulleys in sitting to improve AROM and  stretching along the shoulder -- x 7 min into flexion and abduction - x 20 UE ranger flexion in standing - x20 Theraband rows with GTB - x20 Shoulder extension with GTB B in standing - x 20 Shoulder abduction with green theraband - x 20  Manual therapy STM performed along patient's deltoids on the L and the R as well as the Upper trap on each side B to decrease increased muscular spasms and pain with patient positioned in sitting - x 10min  Patient demonstrates improvement overall with exercises however has increased pain with performing overhead motions.   PT Education - 06/29/18 1116    Education Details  form/technique with exercise    Person(s) Educated  Patient    Methods  Explanation;Demonstration    Comprehension  Verbalized understanding;Returned demonstration          PT Long Term Goals - 06/07/18 1259      PT LONG TERM GOAL #1   Title  Patient will improve AROM to 120 degrees left shoulder with mild discomfort for reaching into cabinet or performing hair care     Baseline  95 AROM in sitting; 09/14/2017: 105 deg; 10/28/2017:  115; 12/23/2017: 120    Time  8    Period  Weeks    Status  Achieved      PT LONG TERM GOAL #2   Title  Patient will improve all shoulder strength 4+/5 for flexion and rotation to improve ability to manipulate objects in space.     Baseline  3/5 - 3+/5 within all measured motions; 09/14/2017: 3+/5 with all motion; 10/28/2017: 3+/5 for rotational movement and flexion improvement with extension and adduction to 4-/5; 4-/5 to 3+/5 for all shoulder movements; 02/07/2018: 4-/5 rotational movement IR/ER, 4/5 for flexion and abduction; 03/10/2018: 4/5-4+/5; 06/07/2018: 4/5- 3/5 B    Time  8    Period  Weeks    Status  On-going      PT LONG TERM GOAL #3   Title  Patient will  be independent with home program for strength and pain control for left UE/shoulder by 04/17/2015    Baseline  limited knowledge of pain control strategies or progression of exercises for  left UE; 09/14/2017: Requires moderate cueing for exercise progression; 10/28/2017: Continues to require minimal cueing to correct; 12/23/2017: continues to require moderate cueing for exercise performance; 02/07/2018: independent with HEP    Time  8    Period  Weeks    Status  Achieved      PT LONG TERM GOAL #4   Title  Patient will have a worst pain of a 3/10 to allow for performance of household activities without increased pain    Baseline  8/10; 12/23/2017: 8/10; 02/07/2018: 7/10 Both shoulder and LBP; 03/10/2018: 6/10; 06/07/2018: 10/10    Time  8    Period  Weeks    Status  On-going      PT LONG TERM GOAL #5   Title  Patient will be able to stand for over 5 hours without increase in pain to allow for cooking for long periods of time wihtout increase in pain    Baseline  Increased pain after 4 hours from standing; 12/23/2017: Able to perform 5 hours of cooking with increased pain; 02/07/2018: Has not been cooking secondary to family difficulties; 03/10/2018: can cook 4 hours on cooking but has increased pain at after 4 hours; 06/07/2018: unable to test secondary to shoulder pain    Time  8    Period  Weeks    Status  On-going      PT LONG TERM GOAL #6   Title  Patient will improve her MODI to under 48% to indicate significant improvement in lower back function and improvement with cooking in standing.    Baseline  58% MODI dysfunction; 03/10/2018: 52%: MODI dysfunction; 06/07/2018: will address NV    Time  6    Period  Weeks    Status  On-going            Plan - 06/29/18 1150    Clinical Impression Statement  Patient demonstrates improvement with exercises however continues to have increased pain with performing shoulder flexion/abduction on the L side along the deloid. Patient demonstrates decreased pain along the affected area after performing manual therapy. Patient will benefit from further skilled therapy focused on decreasing pain and strengthening the affected deltiods and  shoulder girdle musculature to return to prior level of function.    Rehab Potential  Fair    Clinical Impairments Affecting Rehab Potential  (+) highly motivated, (-) age, decreased strength    PT Frequency  2x / week    PT Duration  8  weeks    PT Treatment/Interventions  Passive range of motion;Manual techniques;Electrical Stimulation;Cryotherapy;Moist Heat;Ultrasound;Therapeutic exercise;Therapeutic activities;Neuromuscular re-education;Patient/family education;Scar mobilization;Dry needling;Iontophoresis 4mg /ml Dexamethasone    PT Next Visit Plan  progress AROM exercises    PT Home Exercise Plan  See education section    Consulted and Agree with Plan of Care  Patient       Patient will benefit from skilled therapeutic intervention in order to improve the following deficits and impairments:  Pain, Impaired sensation, Decreased coordination, Decreased mobility, Increased muscle spasms, Decreased activity tolerance, Decreased endurance, Decreased range of motion, Decreased strength  Visit Diagnosis: 1. Chronic left shoulder pain   2. Muscle weakness (generalized)   3. Stiffness of left shoulder, not elsewhere classified        Problem List There are no active problems to display for this patient.   Myrene GalasWesley Fermina Mishkin, PT DPT 06/29/2018, 1:05 PM   [facility] [address] [place], [place], [address] Phone: [phone]   Fax:  [phone]  Name: Suzanne Snyder MRN: [id number] Date of Birth: 12-21-1950

## 2018-07-04 ENCOUNTER — Ambulatory Visit: Payer: Medicare Other

## 2018-07-05 ENCOUNTER — Ambulatory Visit: Payer: Medicare Other

## 2018-07-05 ENCOUNTER — Other Ambulatory Visit: Payer: Self-pay

## 2018-07-05 DIAGNOSIS — M25512 Pain in left shoulder: Secondary | ICD-10-CM | POA: Diagnosis not present

## 2018-07-05 DIAGNOSIS — G8929 Other chronic pain: Secondary | ICD-10-CM

## 2018-07-05 DIAGNOSIS — M6281 Muscle weakness (generalized): Secondary | ICD-10-CM

## 2018-07-05 NOTE — Therapy (Signed)
[facility] [address] [place], [address] Phone: [phone]   Fax:  [phone]  Physical Therapy Treatment  Patient Details  Name: Suzanne Snyder MRN: [id number] Date of Birth: 12/19/1950 Referring Provider (PT): Kathyrn Sheriff MD   Encounter Date: 07/05/2018  PT End of Session - 07/05/18 0916    Visit Number  36    Number of Visits  38    Date for PT Re-Evaluation  07/19/18    Authorization Type  8/10    PT Start Time  0905    PT Stop Time  0945    PT Time Calculation (min)  40 min    Activity Tolerance  Patient tolerated treatment well    Behavior During Therapy  [facility] for tasks assessed/performed       Past Medical History:  Diagnosis Date  . Anxiety   . Diabetes mellitus without complication ([place] [facility])   . Osteoporosis     Past Surgical History:  Procedure Laterality Date  . 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 ROTATOR CUFF REPAIR Left 2014 and 2015  . SPINE SURGERY N/A 1984   L4-5, not sure of procedure    There were no vitals filed for this visit.  Subjective Assessment - 07/05/18 0910    Subjective  Patient reports last Thursday increased pain after picking up a heavy items of clothes.    Pertinent History  Chronic history of L shoulder pain, sciolosis, laminectomy     Limitations  Lifting;Sitting    Patient Stated Goals  To improve shoulder function and decrease pain    Currently in Pain?  Yes    Pain Score  6     Pain Location  Shoulder    Pain Orientation  Right;Left    Pain Descriptors / Indicators  Aching    Pain Type  Chronic pain    Pain Onset  More than a month ago    Pain Frequency  Intermittent        TREATMENT Therapeutic Exercise Scapular retraction in standing -- 10sec holds x 10 AAROM shoulder flexion on the R -- x 20 Ulnar nerve glides in sitting with therapist support -- x 10  On the L Ulnar nerve glides at doorframe -- x10 with 5 - 10 sec holds B  Educated  patient on performance of mobility exercises like the pulleys over the door and scapular retraction to manage symptoms and to decrease pain; explained importance of grading strength to not flare up symptoms with lifting heavy boxes.   Manual therapy STM performed along patient's deltoids on the L and the R as well as the Upper trap on each side B to decrease increased muscular spasms and pain with patient positioned in sitting - x 73min  Patient demonstrates improvement in pain at the end of the session but continues to demonstrate increased pain with overhead motions.       PT Education - 07/05/18 0916    Education Details  form/technique with exercise; Education of ulnar nerve symptoms.    Person(s) Educated  Patient    Methods  Explanation;Demonstration    Comprehension  Verbalized understanding;Returned demonstration          PT Long Term Goals - 06/07/18 1259      PT LONG TERM GOAL #1   Title  Patient will improve AROM to 120 degrees left shoulder with mild discomfort for reaching into cabinet or performing hair care  Baseline  95 AROM in sitting; 09/14/2017: 105 deg; 10/28/2017: 115; 12/23/2017: 120    Time  8    Period  Weeks    Status  Achieved      PT LONG TERM GOAL #2   Title  Patient will improve all shoulder strength 4+/5 for flexion and rotation to improve ability to manipulate objects in space.     Baseline  3/5 - 3+/5 within all measured motions; 09/14/2017: 3+/5 with all motion; 10/28/2017: 3+/5 for rotational movement and flexion improvement with extension and adduction to 4-/5; 4-/5 to 3+/5 for all shoulder movements; 02/07/2018: 4-/5 rotational movement IR/ER, 4/5 for flexion and abduction; 03/10/2018: 4/5-4+/5; 06/07/2018: 4/5- 3/5 B    Time  8    Period  Weeks    Status  On-going      PT LONG TERM GOAL #3   Title  Patient will  be independent with home program for strength and pain control for left UE/shoulder by 04/17/2015    Baseline  limited knowledge of  pain control strategies or progression of exercises for left UE; 09/14/2017: Requires moderate cueing for exercise progression; 10/28/2017: Continues to require minimal cueing to correct; 12/23/2017: continues to require moderate cueing for exercise performance; 02/07/2018: independent with HEP    Time  8    Period  Weeks    Status  Achieved      PT LONG TERM GOAL #4   Title  Patient will have a worst pain of a 3/10 to allow for performance of household activities without increased pain    Baseline  8/10; 12/23/2017: 8/10; 02/07/2018: 7/10 Both shoulder and LBP; 03/10/2018: 6/10; 06/07/2018: 10/10    Time  8    Period  Weeks    Status  On-going      PT LONG TERM GOAL #5   Title  Patient will be able to stand for over 5 hours without increase in pain to allow for cooking for long periods of time wihtout increase in pain    Baseline  Increased pain after 4 hours from standing; 12/23/2017: Able to perform 5 hours of cooking with increased pain; 02/07/2018: Has not been cooking secondary to family difficulties; 03/10/2018: can cook 4 hours on cooking but has increased pain at after 4 hours; 06/07/2018: unable to test secondary to shoulder pain    Time  8    Period  Weeks    Status  On-going      PT LONG TERM GOAL #6   Title  Patient will improve her MODI to under 48% to indicate significant improvement in lower back function and improvement with cooking in standing.    Baseline  58% MODI dysfunction; 03/10/2018: 52%: MODI dysfunction; 06/07/2018: will address NV    Time  6    Period  Weeks    Status  On-going            Plan - 07/05/18 0947    Clinical Impression Statement  Patient demonstrates increased pain today secondary to liftingheavy items late last week. Becuase of this, performed greater amount of mobility based exercises and educated heavily on performance of mobility based exercises and graded strengthening to decrease risk of flare ups of pain. Patient verbalizes understanding. Focused  on mobility based exercises during the session and manual therapy to decrease muscular guarding. Patient demonstrates improvement overall after doing these interventions indicating decreased guarding and patient will benefit from further skilled therapy to return to prior level of function.    Rehab  Potential  Fair    Clinical Impairments Affecting Rehab Potential  (+) highly motivated, (-) age, decreased strength    PT Frequency  2x / week    PT Duration  8 weeks    PT Treatment/Interventions  Passive range of motion;Manual techniques;Electrical Stimulation;Cryotherapy;Moist Heat;Ultrasound;Therapeutic exercise;Therapeutic activities;Neuromuscular re-education;Patient/family education;Scar mobilization;Dry needling;Iontophoresis 4mg /ml Dexamethasone    PT Next Visit Plan  progress AROM exercises    PT Home Exercise Plan  See education section    Consulted and Agree with Plan of Care  Patient       Patient will benefit from skilled therapeutic intervention in order to improve the following deficits and impairments:  Pain, Impaired sensation, Decreased coordination, Decreased mobility, Increased muscle spasms, Decreased activity tolerance, Decreased endurance, Decreased range of motion, Decreased strength  Visit Diagnosis: 1. Chronic left shoulder pain   2. Muscle weakness (generalized)        Problem List There are no active problems to display for this patient.   Myrene GalasWesley Ramirez Fullbright, PT DPT 07/05/2018, 9:53 AM  [facility] [address] [place], [place], [address] Phone: [phone]   Fax:  [phone]  Name: Suzanne Snyder MRN: [id number] Date of Birth: [birth year]

## 2018-07-06 ENCOUNTER — Ambulatory Visit: Payer: Medicare Other

## 2018-07-07 ENCOUNTER — Ambulatory Visit: Payer: Medicare Other

## 2018-07-07 ENCOUNTER — Other Ambulatory Visit: Payer: Self-pay

## 2018-07-07 DIAGNOSIS — M6281 Muscle weakness (generalized): Secondary | ICD-10-CM

## 2018-07-07 DIAGNOSIS — M25511 Pain in right shoulder: Secondary | ICD-10-CM

## 2018-07-07 DIAGNOSIS — G8929 Other chronic pain: Secondary | ICD-10-CM

## 2018-07-07 DIAGNOSIS — M25512 Pain in left shoulder: Secondary | ICD-10-CM | POA: Diagnosis not present

## 2018-07-07 NOTE — Therapy (Signed)
[facility] [address], [place], [address] Phone: [phone]   Fax:  [phone]  Physical Therapy Treatment  Patient Details  Name: Suzanne Snyder MRN: [id number] Date of Birth: 10-24-1950 Referring Provider (PT): Kathyrn Sheriff MD   Encounter Date: 07/07/2018  PT End of Session - 07/07/18 0926    Visit Number  37    Number of Visits  38    Date for PT Re-Evaluation  07/19/18    Authorization Type  9/10    PT Start Time  0912    PT Stop Time  0953    PT Time Calculation (min)  41 min    Activity Tolerance  Patient tolerated treatment well    Behavior During Therapy  [facility] for tasks assessed/performed       Past Medical History:  Diagnosis Date  . Anxiety   . Diabetes mellitus without complication ([place])   . Osteoporosis     Past Surgical History:  Procedure Laterality Date  . 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 ROTATOR CUFF REPAIR Left 2014 and 2015  . SPINE SURGERY N/A 1984   L4-5, not sure of procedure    There were no vitals filed for this visit.  Subjective Assessment - 07/07/18 0916    Subjective  Patient states she has increased shocking/electrical type pain on the affected arm when walking quickly. Patient states she bought a book on sleep to improve her sleeping ability.    Pertinent History  Chronic history of L shoulder pain, sciolosis, laminectomy     Limitations  Lifting;Sitting    Patient Stated Goals  To improve shoulder function and decrease pain    Currently in Pain?  Yes    Pain Score  5     Pain Location  Shoulder    Pain Orientation  Right;Left    Pain Descriptors / Indicators  Aching    Pain Type  Chronic pain    Pain Onset  More than a month ago       TREATMENT Therapeutic Exercise Pulleys in sitting over the door - x 28min pushing into  Shoulder extension in standing with YTB - x22, x14 Shoulder flexion in standing with UE ranger - 2 x 10 Shoulder abduction  standing with UE ranger - x10 UT activation in standing with YTB - 2 x 10 UT with yellow band with bicep curls with YTB - x 10 Standing shoulder rows with YTB - x 20    Educated patient on performance of mobility exercises like the pulleys over the door and scapular retraction to manage symptoms and to decrease pain    PT Education - 07/07/18 0925    Education Details  form/technique with exercise; HEP: shoulder extension with YTB    Person(s) Educated  Patient    Methods  Explanation;Demonstration    Comprehension  Verbalized understanding;Returned demonstration          PT Long Term Goals - 06/07/18 1259      PT LONG TERM GOAL #1   Title  Patient will improve AROM to 120 degrees left shoulder with mild discomfort for reaching into cabinet or performing hair care     Baseline  95 AROM in sitting; 09/14/2017: 105 deg; 10/28/2017: 115; 12/23/2017: 120    Time  8    Period  Weeks    Status  Achieved      PT LONG TERM GOAL #2   Title  Patient  will improve all shoulder strength 4+/5 for flexion and rotation to improve ability to manipulate objects in space.     Baseline  3/5 - 3+/5 within all measured motions; 09/14/2017: 3+/5 with all motion; 10/28/2017: 3+/5 for rotational movement and flexion improvement with extension and adduction to 4-/5; 4-/5 to 3+/5 for all shoulder movements; 02/07/2018: 4-/5 rotational movement IR/ER, 4/5 for flexion and abduction; 03/10/2018: 4/5-4+/5; 06/07/2018: 4/5- 3/5 B    Time  8    Period  Weeks    Status  On-going      PT LONG TERM GOAL #3   Title  Patient will  be independent with home program for strength and pain control for left UE/shoulder by 04/17/2015    Baseline  limited knowledge of pain control strategies or progression of exercises for left UE; 09/14/2017: Requires moderate cueing for exercise progression; 10/28/2017: Continues to require minimal cueing to correct; 12/23/2017: continues to require moderate cueing for exercise performance;  02/07/2018: independent with HEP    Time  8    Period  Weeks    Status  Achieved      PT LONG TERM GOAL #4   Title  Patient will have a worst pain of a 3/10 to allow for performance of household activities without increased pain    Baseline  8/10; 12/23/2017: 8/10; 02/07/2018: 7/10 Both shoulder and LBP; 03/10/2018: 6/10; 06/07/2018: 10/10    Time  8    Period  Weeks    Status  On-going      PT LONG TERM GOAL #5   Title  Patient will be able to stand for over 5 hours without increase in pain to allow for cooking for long periods of time wihtout increase in pain    Baseline  Increased pain after 4 hours from standing; 12/23/2017: Able to perform 5 hours of cooking with increased pain; 02/07/2018: Has not been cooking secondary to family difficulties; 03/10/2018: can cook 4 hours on cooking but has increased pain at after 4 hours; 06/07/2018: unable to test secondary to shoulder pain    Time  8    Period  Weeks    Status  On-going      PT LONG TERM GOAL #6   Title  Patient will improve her MODI to under 48% to indicate significant improvement in lower back function and improvement with cooking in standing.    Baseline  58% MODI dysfunction; 03/10/2018: 52%: MODI dysfunction; 06/07/2018: will address NV    Time  6    Period  Weeks    Status  On-going            Plan - 07/07/18 [id number]    Clinical Impression Statement  Patient demonstrates no increase in pain at the end of the session and is able to perform more exercises in the session compared to previous sessions indicating functional carryover. Focused on decreasing nerve symptoms with performing shoulder extension and UT activation on the affected side. Patient demonstrates overall with symptoms with performing motions quickly versus slowly, indicating decreased motor control with activity. Patient will benefit from further skilled therapy to return to prior level of function.    Rehab Potential  Fair    Clinical Impairments Affecting Rehab  Potential  (+) highly motivated, (-) age, decreased strength    PT Frequency  2x / week    PT Duration  8 weeks    PT Treatment/Interventions  Passive range of motion;Manual techniques;Electrical Stimulation;Cryotherapy;Moist Heat;Ultrasound;Therapeutic exercise;Therapeutic activities;Neuromuscular re-education;Patient/family education;Scar mobilization;Dry needling;Iontophoresis 4mg /ml  Dexamethasone    PT Next Visit Plan  progress AROM exercises    PT Home Exercise Plan  See education section    Consulted and Agree with Plan of Care  Patient       Patient will benefit from skilled therapeutic intervention in order to improve the following deficits and impairments:  Pain, Impaired sensation, Decreased coordination, Decreased mobility, Increased muscle spasms, Decreased activity tolerance, Decreased endurance, Decreased range of motion, Decreased strength  Visit Diagnosis: 1. Chronic left shoulder pain   2. Muscle weakness (generalized)   3. Chronic right shoulder pain        Problem List There are no active problems to display for this patient.   Myrene GalasWesley Tashaun Obey, PT DPT 07/07/2018, 10:21 AM  [facility] [address] [place], [place], [address] Phone: [phone]   Fax:  [phone]  Name: Henreitta LeberDeborah L Dawes MRN: [id number] Date of Birth: [birth year]

## 2018-07-11 ENCOUNTER — Ambulatory Visit: Payer: Medicare Other

## 2018-07-11 ENCOUNTER — Other Ambulatory Visit: Payer: Self-pay

## 2018-07-11 DIAGNOSIS — M6281 Muscle weakness (generalized): Secondary | ICD-10-CM

## 2018-07-11 DIAGNOSIS — G8929 Other chronic pain: Secondary | ICD-10-CM

## 2018-07-11 DIAGNOSIS — M25512 Pain in left shoulder: Secondary | ICD-10-CM | POA: Diagnosis not present

## 2018-07-12 NOTE — Therapy (Signed)
[facility] [address] [place], [address] Phone: [phone]   Fax:  [phone]  Physical Therapy Treatment/Progress note  Patient Details  Name: Suzanne Snyder MRN: [id number] Date of Birth: 1950/02/22 Referring Provider (PT): Kathyrn Sheriff MD   Encounter Date: 07/11/2018  PT End of Session - 07/12/18 0814    Visit Number  38    Number of Visits  46    Date for PT Re-Evaluation  08/09/18    Authorization Type  10/10 Progress note today    PT Start Time  1100    PT Stop Time  1145    PT Time Calculation (min)  45 min    Activity Tolerance  Patient tolerated treatment well    Behavior During Therapy  [facility] for tasks assessed/performed       Past Medical History:  Diagnosis Date  . Anxiety   . Diabetes mellitus without complication ([place])   . Osteoporosis     Past Surgical History:  Procedure Laterality Date  . 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 ROTATOR CUFF REPAIR Left 2014 and 2015  . SPINE SURGERY N/A 1984   L4-5, not sure of procedure    There were no vitals filed for this visit.  Subjective Assessment - 07/11/18 1559    Subjective  Patient reports she did not sleep well and is tired today. Patient reports her shoulder bothers her the most when reaching behind her. Patient feels like she is improving overall but still fustrated that her L shoulder isn't as strong as her R shoulder.    Pertinent History  Chronic history of L shoulder pain, sciolosis, laminectomy     Limitations  Lifting;Sitting    Patient Stated Goals  To improve shoulder function and decrease pain    Currently in Pain?  Yes    Pain Score  5     Pain Location  Arm    Pain Orientation  Right;Left    Pain Descriptors / Indicators  Aching    Pain Type  Chronic pain    Pain Onset  More than a month ago    Pain Frequency  Intermittent       TREATMENT Therapeutic Exercise Pulleys in sitting over the door - x 26min  pushing into  Shoulder extension in standing with RTB - x30 Shoulder flexion in standing in sitting - 2 x 10 Shoulder abduction standing in sitting  - x10 Bicep curls with RTB - x 10 Standing shoulder rows with RTB - x 20 x Elbow extension in sitting - x 10 with RTB    Educated patient on performance of mobility exercises like the pulleys over the door and scapular retraction to manage symptoms and to decrease pain     PT Education - 07/11/18 1650    Education Details  form/technique with exercises; HEP: shoulder flexion overhead    Person(s) Educated  Patient    Methods  Explanation;Demonstration    Comprehension  Verbalized understanding;Returned demonstration          PT Long Term Goals - 07/12/18 0819      PT LONG TERM GOAL #1   Title  Patient will improve AROM to 120 degrees left shoulder with mild discomfort for reaching into cabinet or performing hair care     Baseline  95 AROM in sitting; 09/14/2017: 105 deg; 10/28/2017: 115; 12/23/2017: 120    Time  8    Period  Weeks  Status  Achieved      PT LONG TERM GOAL #2   Title  Patient will improve all shoulder strength 4+/5 for flexion and rotation to improve ability to manipulate objects in space.     Baseline  3/5 - 3+/5 within all measured motions; 09/14/2017: 3+/5 with all motion; 10/28/2017: 3+/5 for rotational movement and flexion improvement with extension and adduction to 4-/5; 4-/5 to 3+/5 for all shoulder movements; 02/07/2018: 4-/5 rotational movement IR/ER, 4/5 for flexion and abduction; 03/10/2018: 4/5-4+/5; 06/07/2018: 4/5- 3/5 B; 07/12/2018: 4+/5 for flexion, 4/5 for abduction on L shoulder    Time  8    Period  Weeks    Status  On-going      PT LONG TERM GOAL #3   Title  Patient will  be independent with home program for strength and pain control for left UE/shoulder by 04/17/2015    Baseline  limited knowledge of pain control strategies or progression of exercises for left UE; 09/14/2017: Requires moderate cueing  for exercise progression; 10/28/2017: Continues to require minimal cueing to correct; 12/23/2017: continues to require moderate cueing for exercise performance; 02/07/2018: independent with HEP    Time  8    Period  Weeks    Status  Achieved      PT LONG TERM GOAL #4   Title  Patient will have a worst pain of a 3/10 to allow for performance of household activities without increased pain    Baseline  8/10; 12/23/2017: 8/10; 02/07/2018: 7/10 Both shoulder and LBP; 03/10/2018: 6/10; 06/07/2018: 10/10; 07/12/2018: 7/10    Time  8    Period  Weeks    Status  On-going      PT LONG TERM GOAL #5   Title  Patient will be able to stand for over 5 hours without increase in pain to allow for cooking for long periods of time wihtout increase in pain    Baseline  Increased pain after 4 hours from standing; 12/23/2017: Able to perform 5 hours of cooking with increased pain; 02/07/2018: Has not been cooking secondary to family difficulties; 03/10/2018: can cook 4 hours on cooking but has increased pain at after 4 hours; 06/07/2018: unable to test secondary to shoulder pain; 07/12/2018: reports she is able to perform with mild discomfort    Time  8    Period  Weeks    Status  Achieved      PT LONG TERM GOAL #6   Title  Patient will improve her MODI to under 48% to indicate significant improvement in lower back function and improvement with cooking in standing.    Baseline  58% MODI dysfunction; 03/10/2018: 52%: MODI dysfunction; 06/07/2018: will address NV; 07/11/18: 32%    Time  6    Period  Weeks    Status  Achieved            Plan - 07/12/18 0815    Clinical Impression Statement  Patient is making progress towards long term goals with improvement in MODI, shoulder flexion/abduction strength throughout the L shoulder, and overall less pain through the L shoulder on the NPRS scale. Patient also is having overall less pain throughout the day. Although patient is improving, she continues to demonstrate  considerable weakness along the affected shoulder and has increased difficulty with functional activities such as lifting kitchen items (plates, cups, and pans) overhead into a high shelf. Patient demonstrates pain catastrophizing behavior limiting progress although this has been improving with pain education. Patient will benefit  from further skilled therapy focused on improving pain knowledge and improvement in shoulder strength to improve ability to perform functional tasks.    Rehab Potential  Fair    Clinical Impairments Affecting Rehab Potential  (+) highly motivated, (-) age, decreased strength    PT Frequency  2x / week    PT Duration  8 weeks    PT Treatment/Interventions  Passive range of motion;Manual techniques;Electrical Stimulation;Cryotherapy;Moist Heat;Ultrasound;Therapeutic exercise;Therapeutic activities;Neuromuscular re-education;Patient/family education;Scar mobilization;Dry needling;Iontophoresis 4mg /ml Dexamethasone    PT Next Visit Plan  progress AROM exercises    PT Home Exercise Plan  See education section    Consulted and Agree with Plan of Care  Patient       Patient will benefit from skilled therapeutic intervention in order to improve the following deficits and impairments:  Pain, Impaired sensation, Decreased coordination, Decreased mobility, Increased muscle spasms, Decreased activity tolerance, Decreased endurance, Decreased range of motion, Decreased strength  Visit Diagnosis: 1. Chronic left shoulder pain   2. Muscle weakness (generalized)   3. Chronic right shoulder pain   4. Chronic right-sided low back pain without sciatica        Problem List There are no active problems to display for this patient.   Myrene GalasWesley Sujata Maines, PT DPT 07/12/2018, 9:59 AM  [facility] [address] [place], [place], [address] Phone: [phone]   Fax:  [phone]  Name: Suzanne Snyder MRN: [id number] Date  of Birth: February 22, 1950

## 2018-07-14 ENCOUNTER — Ambulatory Visit: Payer: Medicare Other | Attending: Orthopedic Surgery

## 2018-07-14 ENCOUNTER — Other Ambulatory Visit: Payer: Self-pay

## 2018-07-14 DIAGNOSIS — M6281 Muscle weakness (generalized): Secondary | ICD-10-CM | POA: Insufficient documentation

## 2018-07-14 DIAGNOSIS — M25511 Pain in right shoulder: Secondary | ICD-10-CM | POA: Insufficient documentation

## 2018-07-14 DIAGNOSIS — M25512 Pain in left shoulder: Secondary | ICD-10-CM | POA: Insufficient documentation

## 2018-07-14 DIAGNOSIS — G8929 Other chronic pain: Secondary | ICD-10-CM | POA: Diagnosis present

## 2018-07-14 DIAGNOSIS — M25612 Stiffness of left shoulder, not elsewhere classified: Secondary | ICD-10-CM | POA: Diagnosis present

## 2018-07-14 NOTE — Therapy (Signed)
[facility] [address] [place], [place], [address] Phone: [phone]   Fax:  [phone]  Physical Therapy Treatment  Patient Details  Name: Suzanne Snyder MRN: [id number] Date of Birth: 08/01/1950 Referring Provider (PT): Olga MillersKamath MD   Encounter Date: 07/14/2018  PT End of Session - 07/14/18 1421    Visit Number  39    Number of Visits  46    Date for PT Re-Evaluation  08/09/18    Authorization Type  1/10    PT Start Time  1400    PT Stop Time  1445    PT Time Calculation (min)  45 min    Activity Tolerance  Patient tolerated treatment well    Behavior During Therapy  [facility] for tasks assessed/performed       Past Medical History:  Diagnosis Date  . Anxiety   . Diabetes mellitus without complication (HCC)   . Osteoporosis     Past Surgical History:  Procedure Laterality Date  . 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 ROTATOR CUFF REPAIR Left 2014 and 2015  . SPINE SURGERY N/A 1984   L4-5, not sure of procedure    There were no vitals filed for this visit.  Subjective Assessment - 07/14/18 1411    Subjective  Patient reports she is feeling "okay" today. Patient reports her shoulder has been bothering her but not as much as it has in the past.    Pertinent History  Chronic history of L shoulder pain, sciolosis, laminectomy     Limitations  Lifting;Sitting    Patient Stated Goals  To improve shoulder function and decrease pain    Currently in Pain?  Yes    Pain Score  5     Pain Location  Arm    Pain Orientation  Right;Left    Pain Descriptors / Indicators  Aching    Pain Type  Chronic pain    Pain Onset  More than a month ago    Pain Frequency  Intermittent          TREATMENT Therapeutic Exercise Pulleys in sitting over the door - x 8min pushing into  Shoulder extension in standing with RTB - x30 Shoulder flexion in standing in sitting - 2 x 10 with ranger Shoulder circles in standing  with use of UE ranger - x 10  Bicep curls with RTB - x 10 Elbow extension with RTB - x 10    Educated patient on performance of mobility exercises like the pulleys over the door and scapular retraction to manage symptoms and to decrease pain   PT Education - 07/14/18 1420    Education Details  form/technique with exercise    Person(s) Educated  Patient    Methods  Explanation;Demonstration    Comprehension  Verbalized understanding;Returned demonstration          PT Long Term Goals - 07/12/18 0819      PT LONG TERM GOAL #1   Title  Patient will improve AROM to 120 degrees left shoulder with mild discomfort for reaching into cabinet or performing hair care     Baseline  95 AROM in sitting; 09/14/2017: 105 deg; 10/28/2017: 115; 12/23/2017: 120    Time  8    Period  Weeks    Status  Achieved      PT LONG TERM GOAL #2   Title  Patient will improve all shoulder strength 4+/5 for flexion and rotation  to improve ability to manipulate objects in space.     Baseline  3/5 - 3+/5 within all measured motions; 09/14/2017: 3+/5 with all motion; 10/28/2017: 3+/5 for rotational movement and flexion improvement with extension and adduction to 4-/5; 4-/5 to 3+/5 for all shoulder movements; 02/07/2018: 4-/5 rotational movement IR/ER, 4/5 for flexion and abduction; 03/10/2018: 4/5-4+/5; 06/07/2018: 4/5- 3/5 B; 07/12/2018: 4+/5 for flexion, 4/5 for abduction on L shoulder    Time  8    Period  Weeks    Status  On-going      PT LONG TERM GOAL #3   Title  Patient will  be independent with home program for strength and pain control for left UE/shoulder by 04/17/2015    Baseline  limited knowledge of pain control strategies or progression of exercises for left UE; 09/14/2017: Requires moderate cueing for exercise progression; 10/28/2017: Continues to require minimal cueing to correct; 12/23/2017: continues to require moderate cueing for exercise performance; 02/07/2018: independent with HEP    Time  8    Period   Weeks    Status  Achieved      PT LONG TERM GOAL #4   Title  Patient will have a worst pain of a 3/10 to allow for performance of household activities without increased pain    Baseline  8/10; 12/23/2017: 8/10; 02/07/2018: 7/10 Both shoulder and LBP; 03/10/2018: 6/10; 06/07/2018: 10/10; 07/12/2018: 7/10    Time  8    Period  Weeks    Status  On-going      PT LONG TERM GOAL #5   Title  Patient will be able to stand for over 5 hours without increase in pain to allow for cooking for long periods of time wihtout increase in pain    Baseline  Increased pain after 4 hours from standing; 12/23/2017: Able to perform 5 hours of cooking with increased pain; 02/07/2018: Has not been cooking secondary to family difficulties; 03/10/2018: can cook 4 hours on cooking but has increased pain at after 4 hours; 06/07/2018: unable to test secondary to shoulder pain; 07/12/2018: reports she is able to perform with mild discomfort    Time  8    Period  Weeks    Status  Achieved      PT LONG TERM GOAL #6   Title  Patient will improve her MODI to under 48% to indicate significant improvement in lower back function and improvement with cooking in standing.    Baseline  58% MODI dysfunction; 03/10/2018: 52%: MODI dysfunction; 06/07/2018: will address NV; 07/11/18: 32%    Time  6    Period  Weeks    Status  Achieved            Plan - 07/14/18 1432    Clinical Impression Statement  Patient demonstrates improvement with shoulder exercise with ability to participate in a greater amount of exercise with less total amount of pain. Patient demonstrates good scapular control with exercise although she requires cueing for proper technique most notably with performing overhead movement. Patient will benefit from further skilled therapy to return to prior level of function.    Rehab Potential  Fair    Clinical Impairments Affecting Rehab Potential  (+) highly motivated, (-) age, decreased strength    PT Frequency  2x / week     PT Duration  8 weeks    PT Treatment/Interventions  Passive range of motion;Manual techniques;Electrical Stimulation;Cryotherapy;Moist Heat;Ultrasound;Therapeutic exercise;Therapeutic activities;Neuromuscular re-education;Patient/family education;Scar mobilization;Dry needling;Iontophoresis 4mg /ml Dexamethasone    PT Next  Visit Plan  progress AROM exercises    PT Home Exercise Plan  See education section    Consulted and Agree with Plan of Care  Patient       Patient will benefit from skilled therapeutic intervention in order to improve the following deficits and impairments:  Pain, Impaired sensation, Decreased coordination, Decreased mobility, Increased muscle spasms, Decreased activity tolerance, Decreased endurance, Decreased range of motion, Decreased strength  Visit Diagnosis: 1. Muscle weakness (generalized)   2. Chronic left shoulder pain   3. Chronic right shoulder pain        Problem List There are no active problems to display for this patient.   Blythe Stanford, PT DPT 07/14/2018, 2:44 PM  [place] [facility] [address] [place], [address] Phone: [phone]   Fax:  [phone]  Name: Suzanne Snyder MRN: [id number] Date of Birth: 1950/04/27

## 2018-07-19 ENCOUNTER — Other Ambulatory Visit: Payer: Self-pay

## 2018-07-19 ENCOUNTER — Ambulatory Visit: Payer: Medicare Other

## 2018-07-19 DIAGNOSIS — M25512 Pain in left shoulder: Secondary | ICD-10-CM

## 2018-07-19 DIAGNOSIS — M6281 Muscle weakness (generalized): Secondary | ICD-10-CM

## 2018-07-19 DIAGNOSIS — G8929 Other chronic pain: Secondary | ICD-10-CM

## 2018-07-19 NOTE — Therapy (Signed)
[facility] [address] [place], [place], [address] Phone: [phone]   Fax:  [phone]  Physical Therapy Treatment  Patient Details  Name: Suzanne LeberDeborah L Hyle MRN: [id number] Date of Birth: 10/24/1950 Referring Provider (PT): Olga MillersKamath MD   Encounter Date: 07/19/2018  PT End of Session - 07/19/18 1657    Visit Number  40    Number of Visits  46    Date for PT Re-Evaluation  08/09/18    Authorization Type  2/10    PT Start Time  1345    PT Stop Time  1430    PT Time Calculation (min)  45 min    Activity Tolerance  Patient tolerated treatment well    Behavior During Therapy  [facility]       Past Medical History:  Diagnosis Date  . Anxiety   . Diabetes mellitus without complication (HCC)   . Osteoporosis     Past Surgical History:  Procedure Laterality Date  . 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 ROTATOR CUFF REPAIR Left 2014 and 2015  . SPINE SURGERY N/A 1984   L4-5, not sure of procedure    There were no vitals filed for this visit.  Subjective Assessment - 07/19/18 1352    Subjective  Patient reports her shoulder is hurting more today. Patient states she has had increased pain for the past 3 days and states she has nto slept well and it's been unreleventing.    Pertinent History  Chronic history of L shoulder pain, sciolosis, laminectomy     Limitations  Lifting;Sitting    Patient Stated Goals  To improve shoulder function and decrease pain    Currently in Pain?  Yes    Pain Score  8     Pain Location  Arm    Pain Orientation  Left    Pain Descriptors / Indicators  Aching    Pain Type  Chronic pain    Pain Onset  More than a month ago    Pain Frequency  Intermittent       TREATMENT Therapeutic Exercise Pulleys in sitting - 2 x 5 min Scapular retraction in sitting - x 20  Shoulder Rows with YTB - x 20 Shoulder rows/shoulder extension - x 20  UT stretch in  sitting with manual assistance from therapist - x 20 AAROM shoulder flexion with therapist assistance - x 30 Thoracic rotation in sitting against manual assistance - x 20  Shoulder abduction in sitting with therapist support - x 10 90deg  Manual therapy STM performed along patient's deltoids on the L and the R as well as the Upper trap on each side B to decrease increased muscular spasms and pain with patient positioned in sitting - x 10min  Patient demonstrates decreased pain at the end of the session compared to the start of the session. Performed mobility based exercise to decrease pain.   PT Education - 07/19/18 1416    Education Details  form/technique with exercise    Person(s) Educated  Patient    Methods  Explanation;Demonstration    Comprehension  Verbalized understanding;Returned demonstration          PT Long Term Goals - 07/12/18 0819      PT LONG TERM GOAL #1   Title  Patient will improve AROM to 120 degrees left shoulder with mild discomfort for reaching into cabinet or performing hair care     Baseline  95 AROM in sitting; 09/14/2017: 105 deg; 10/28/2017: 115; 12/23/2017: 120    Time  8    Period  Weeks    Status  Achieved      PT LONG TERM GOAL #2   Title  Patient will improve all shoulder strength 4+/5 for flexion and rotation to improve ability to manipulate objects in space.     Baseline  3/5 - 3+/5 within all measured motions; 09/14/2017: 3+/5 with all motion; 10/28/2017: 3+/5 for rotational movement and flexion improvement with extension and adduction to 4-/5; 4-/5 to 3+/5 for all shoulder movements; 02/07/2018: 4-/5 rotational movement IR/ER, 4/5 for flexion and abduction; 03/10/2018: 4/5-4+/5; 06/07/2018: 4/5- 3/5 B; 07/12/2018: 4+/5 for flexion, 4/5 for abduction on L shoulder    Time  8    Period  Weeks    Status  On-going      PT LONG TERM GOAL #3   Title  Patient will  be independent with home program for strength and pain control for left UE/shoulder by  04/17/2015    Baseline  limited knowledge of pain control strategies or progression of exercises for left UE; 09/14/2017: Requires moderate cueing for exercise progression; 10/28/2017: Continues to require minimal cueing to correct; 12/23/2017: continues to require moderate cueing for exercise performance; 02/07/2018: independent with HEP    Time  8    Period  Weeks    Status  Achieved      PT LONG TERM GOAL #4   Title  Patient will have a worst pain of a 3/10 to allow for performance of household activities without increased pain    Baseline  8/10; 12/23/2017: 8/10; 02/07/2018: 7/10 Both shoulder and LBP; 03/10/2018: 6/10; 06/07/2018: 10/10; 07/12/2018: 7/10    Time  8    Period  Weeks    Status  On-going      PT LONG TERM GOAL #5   Title  Patient will be able to stand for over 5 hours without increase in pain to allow for cooking for long periods of time wihtout increase in pain    Baseline  Increased pain after 4 hours from standing; 12/23/2017: Able to perform 5 hours of cooking with increased pain; 02/07/2018: Has not been cooking secondary to family difficulties; 03/10/2018: can cook 4 hours on cooking but has increased pain at after 4 hours; 06/07/2018: unable to test secondary to shoulder pain; 07/12/2018: reports she is able to perform with mild discomfort    Time  8    Period  Weeks    Status  Achieved      PT LONG TERM GOAL #6   Title  Patient will improve her MODI to under 48% to indicate significant improvement in lower back function and improvement with cooking in standing.    Baseline  58% MODI dysfunction; 03/10/2018: 52%: MODI dysfunction; 06/07/2018: will address NV; 07/11/18: 32%    Time  6    Period  Weeks    Status  Achieved            Plan - 07/19/18 1658    Clinical Impression Statement  Patient demonstrates increased pain today which is improved after performing AAROM shoulder flexion and manual therapy to the affected area. Patient then able to perform shoulder AROM  without assistance and less pain overall at the end of the sesison. Patient continues to demonstrate decreased shouluder strength which is limiting ability to perform prolonged ADLs requiring the L UE. Patient will benefit from further skilled therapy to further improve  limitation and return to ADLs at full capacity.    Rehab Potential  Fair    Clinical Impairments Affecting Rehab Potential  (+) highly motivated, (-) age, decreased strength    PT Frequency  2x / week    PT Duration  8 weeks    PT Treatment/Interventions  Passive range of motion;Manual techniques;Electrical Stimulation;Cryotherapy;Moist Heat;Ultrasound;Therapeutic exercise;Therapeutic activities;Neuromuscular re-education;Patient/family education;Scar mobilization;Dry needling;Iontophoresis 4mg /ml Dexamethasone    PT Next Visit Plan  progress AROM exercises    PT Home Exercise Plan  See education section    Consulted and Agree with Plan of Care  Patient       Patient will benefit from skilled therapeutic intervention in order to improve the following deficits and impairments:  Pain, Impaired sensation, Decreased coordination, Decreased mobility, Increased muscle spasms, Decreased activity tolerance, Decreased endurance, Decreased range of motion, Decreased strength  Visit Diagnosis: 1. Muscle weakness (generalized)   2. Chronic left shoulder pain        Problem List There are no active problems to display for this patient.   Myrene GalasWesley Josef Tourigny, PT DPT 07/19/2018, 5:01 PM  [facility] [address] [place], [place], [address] Phone: [phone]   Fax:  [phone]  Name: Suzanne LeberDeborah L Goldsmith MRN: [id number] Date of Birth: 12-01-1950

## 2018-07-21 ENCOUNTER — Other Ambulatory Visit: Payer: Self-pay

## 2018-07-21 ENCOUNTER — Ambulatory Visit: Payer: Medicare Other

## 2018-07-21 DIAGNOSIS — M6281 Muscle weakness (generalized): Secondary | ICD-10-CM | POA: Diagnosis not present

## 2018-07-21 DIAGNOSIS — G8929 Other chronic pain: Secondary | ICD-10-CM

## 2018-07-21 DIAGNOSIS — M25512 Pain in left shoulder: Secondary | ICD-10-CM

## 2018-07-21 DIAGNOSIS — M25612 Stiffness of left shoulder, not elsewhere classified: Secondary | ICD-10-CM

## 2018-07-21 NOTE — Therapy (Addendum)
[place] [facility] [address] [place], [address] Phone: [phone]   Fax:  [phone]  Physical Therapy Treatment  Patient Details  Name: Suzanne Snyder MRN: [id number] Date of Birth: 02/15/1950 Referring Provider (PT): Kathyrn Sheriff MD   Encounter Date: 07/21/2018  PT End of Session - 07/21/18 1434    Visit Number  41    Number of Visits  46    Date for PT Re-Evaluation  08/09/18    Authorization Type  3/10    PT Start Time  1345    PT Stop Time  1430    PT Time Calculation (min)  45 min    Activity Tolerance  Patient tolerated treatment well    Behavior During Therapy  [facility] for tasks assessed/performed       Past Medical History:  Diagnosis Date   Anxiety    Diabetes mellitus without complication ([place])    Osteoporosis     Past Surgical History:  Procedure Laterality Date   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  Subjective Assessment - 07/21/18 1355    Subjective  Patient reports no changes in function/pain since last treatment. She reports getting more sleep last night than she normally gets.    Pertinent History  Chronic history of L shoulder pain, sciolosis, laminectomy     Limitations  Lifting;Sitting    Patient Stated Goals  To improve shoulder function and decrease pain    Pain Score  6     Pain Location  Arm    Pain Orientation  Left    Pain Descriptors / Indicators  Aching    Pain Type  Chronic pain    Pain Onset  More than a month ago    Pain Frequency  Intermittent        [facility] Adult PT Treatment/Exercise - 07/21/18 1425      Ambulation/Gait   Gait Comments  Decreased gait speed, increased instability with performance      Posture/Postural Control   Posture/Postural Control  Postural limitations    Postural Limitations  Rounded  Shoulders       Therapeutic Exercise:   Seated scapular retractions x20 with tactile cueing at mid-back Seated bilateral shoulder extension with RTB x12 Seated bilateral bicep curls with RTB x20 Right shoulder AAROM flexion with UE Ranger - 120 degrees x20 Left shoulder AAROM flexion with UE Ranger - 120 degrees x20 Right shoulder AAROM circles at 90 degrees with UE Ranger x20 Bilateral UE pulleys - Flexion, 5 min Seated thoracic rotation (crossed arms) x5   Manual Therapy:   Seated left shoulder PROM flexion 90 degrees x20 Seated left shoulder AAROM flexion 90 degrees x10 Seated thoracic rotation MET x5 (3-5s holds)  Performed exercises to improve strengthening most notably along the L shoulder to better be able to raise from a lowered surface.    PT Education - 07/21/18 1433    Education Details  Educated on [employer], plan of care. Instructed to continue HEP.    Person(s) Educated  Patient    Methods  Explanation;Verbal cues;Tactile cues    Comprehension  Verbalized understanding;Returned demonstration          PT Long Term Goals - 07/12/18 0819      PT LONG TERM GOAL #1  Title  Patient will improve AROM to 120 degrees left shoulder with mild discomfort for reaching into cabinet or performing hair care    ° Baseline  95 AROM in sitting; 09/14/2017: 105 deg; 10/28/2017: 115; 12/23/2017: 120   ° Time  8   ° Period  Weeks   ° Status  Achieved   °  ° PT LONG TERM GOAL #2  ° Title  Patient will improve all shoulder strength 4+/5 for flexion and rotation to improve ability to manipulate objects in space.    ° Baseline  3/5 - 3+/5 within all measured motions; 09/14/2017: 3+/5 with all motion; 10/28/2017: 3+/5 for rotational movement and flexion improvement with extension and adduction to 4-/5; 4-/5 to 3+/5 for all shoulder movements; 02/07/2018: 4-/5 rotational movement IR/ER, 4/5 for flexion and abduction; 03/10/2018: 4/5-4+/5; 06/07/2018: 4/5- 3/5 B; 07/12/2018: 4+/5 for flexion,  4/5 for abduction on L shoulder   ° Time  8   ° Period  Weeks   ° Status  On-going   °  ° PT LONG TERM GOAL #3  ° Title  Patient will  be independent with home program for strength and pain control for left UE/shoulder by 04/17/2015   ° Baseline  limited knowledge of pain control strategies or progression of exercises for left UE; 09/14/2017: Requires moderate cueing for exercise progression; 10/28/2017: Continues to require minimal cueing to correct; 12/23/2017: continues to require moderate cueing for exercise performance; 02/07/2018: independent with HEP   ° Time  8   ° Period  Weeks   ° Status  Achieved   °  ° PT LONG TERM GOAL #4  ° Title  Patient will have a worst pain of a 3/10 to allow for performance of household activities without increased pain   ° Baseline  8/10; 12/23/2017: 8/10; 02/07/2018: 7/10 Both shoulder and LBP; 03/10/2018: 6/10; 06/07/2018: 10/10; 07/12/2018: 7/10   ° Time  8   ° Period  Weeks   ° Status  On-going   °  ° PT LONG TERM GOAL #5  ° Title  Patient will be able to stand for over 5 hours without increase in pain to allow for cooking for long periods of time wihtout increase in pain   ° Baseline  Increased pain after 4 hours from standing; 12/23/2017: Able to perform 5 hours of cooking with increased pain; 02/07/2018: Has not been cooking secondary to family difficulties; 03/10/2018: can cook 4 hours on cooking but has increased pain at after 4 hours; 06/07/2018: unable to test secondary to shoulder pain; 07/12/2018: reports she is able to perform with mild discomfort   ° Time  8   ° Period  Weeks   ° Status  Achieved   °  ° PT LONG TERM GOAL #6  ° Title  Patient will improve her MODI to under 48% to indicate significant improvement in lower back function and improvement with cooking in standing.   ° Baseline  58% MODI dysfunction; 03/10/2018: 52%: MODI dysfunction; 06/07/2018: will address NV; 07/11/18: 32%   ° Time  6   ° Period  Weeks   ° Status  Achieved   °  ° ° ° ° ° ° ° °Plan - 07/21/18 1436   °  Clinical Impression Statement  Pt demonstrates increased tolerance to activity, as indicated by the improved volume of exercise she was able to perform without reporting/observed exacerbation of sx. After exercise, pt able to demonstrate improved AAROM with noticeably less muscle guarding and reported pain (  R>L). At the end of the session, pt demonstrated increased left AROM, only reporting slightly increased fatigue and no exacerbation of pain. Patient will benefit from further skilled therapy to return to prior level of function and improve her quality of life.    Personal Factors and Comorbidities  Age;Behavior Pattern;Past/Current Experience    Examination-Activity Limitations  Carry;Sleep;Lift    Examination-Participation Restrictions  Community Activity    Stability/Clinical Decision Making  Stable/Uncomplicated    Clinical Decision Making  Low    Clinical Presentation due to:  Stable/uncomplicated nature of injury, unchanging status    Rehab Potential  Fair    Clinical Impairments Affecting Rehab Potential  (+) highly motivated, (-) age, decreased strength    PT Frequency  2x / week    PT Duration  8 weeks    PT Treatment/Interventions  Passive range of motion;Manual techniques;Electrical Stimulation;Cryotherapy;Moist Heat;Ultrasound;Therapeutic exercise;Therapeutic activities;Neuromuscular re-education;Patient/family education;Scar mobilization;Dry needling;Iontophoresis 66m/ml Dexamethasone    PT Next Visit Plan  progress AROM exercises    PT Home Exercise Plan  See education section    Consulted and Agree with Plan of Care  Patient       Patient will benefit from skilled therapeutic intervention in order to improve the following deficits and impairments:  Pain, Impaired sensation, Decreased coordination, Decreased mobility, Increased muscle spasms, Decreased activity tolerance, Decreased endurance, Decreased range of motion, Decreased strength  Visit Diagnosis: 1. Muscle weakness  (generalized)   2. Stiffness of left shoulder, not elsewhere classified   3. Chronic left shoulder pain   4. Chronic right shoulder pain        Problem List There are no active problems to display for this patient.   TScarlette Calico SPT 07/21/2018, 3:35 PM  [facility] [address] [place] [address]Phone: [phone]  Fax:  [phone] Name: Suzanne COSTENMRN: [id number] of Birth: 120-Sep-1952

## 2018-07-26 ENCOUNTER — Ambulatory Visit: Payer: Medicare Other

## 2018-07-26 ENCOUNTER — Other Ambulatory Visit: Payer: Self-pay

## 2018-07-26 DIAGNOSIS — M25612 Stiffness of left shoulder, not elsewhere classified: Secondary | ICD-10-CM

## 2018-07-26 DIAGNOSIS — M6281 Muscle weakness (generalized): Secondary | ICD-10-CM | POA: Diagnosis not present

## 2018-07-26 DIAGNOSIS — G8929 Other chronic pain: Secondary | ICD-10-CM

## 2018-07-26 NOTE — Therapy (Signed)
[place] [facility] [address] [place], [address] Phone: [phone]   Fax:  [phone]  Physical Therapy Treatment  Patient Details  Name: Suzanne Snyder MRN: [id number] Date of Birth: [birth year] Referring Provider (PT): Kathyrn Sheriff MD   Encounter Date: 07/26/2018  PT End of Session - 07/26/18 1512    Visit Number  42    Number of Visits  46    Date for PT Re-Evaluation  08/09/18    Authorization Type  4/10    PT Start Time  1345    PT Stop Time  1430    PT Time Calculation (min)  45 min    Activity Tolerance  Patient tolerated treatment well    Behavior During Therapy  [facility] for tasks assessed/performed       Past Medical History:  Diagnosis Date  . Anxiety   . Diabetes mellitus without complication ([place])   . Osteoporosis     Past Surgical History:  Procedure Laterality Date  . 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 ROTATOR CUFF REPAIR Left 2014 and 2015  . SPINE SURGERY N/A 1984   L4-5, not sure of procedure    There were no vitals filed for this visit.  Subjective Assessment - 07/26/18 1352    Subjective  Patient reports no significant changes since the previous session. Patient states she has not been sleeping well.    Pertinent History  Chronic history of L shoulder pain, sciolosis, laminectomy     Limitations  Lifting;Sitting    Patient Stated Goals  To improve shoulder function and decrease pain    Currently in Pain?  Yes    Pain Score  3     Pain Location  Shoulder    Pain Orientation  Left    Pain Descriptors / Indicators  Aching    Pain Type  Chronic pain    Pain Onset  More than a month ago    Pain Frequency  Intermittent       TREATMENT Therapeutic Exercise Shoulder flexion with UE ranger -- x 20 Shoulder flexion/IR/ER with UE ranger -- x 20  Scapular retraction in sitting -- x 5 Scapular rows with RTB in sitting -- x 20  Pulleys in sitting -- x 20 -- 8 min   Manual  therapy STM performing the scapular retractor, UT, and deltoids B to decrease increased pain and spasms along the shoulder and improve overall pain.  Performed exercises to decrease increased pain and spasms and strenthen to shoulder to improve functional movement.    PT Education - 07/26/18 1509    Education Details  form/technique with exercise; educated on importance of sleep    Person(s) Educated  Patient    Methods  Explanation;Demonstration    Comprehension  Verbalized understanding;Returned demonstration          PT Long Term Goals - 07/12/18 0819      PT LONG TERM GOAL #1   Title  Patient will improve AROM to 120 degrees left shoulder with mild discomfort for reaching into cabinet or performing hair care     Baseline  95 AROM in sitting; 09/14/2017: 105 deg; 10/28/2017: 115; 12/23/2017: 120    Time  8    Period  Weeks    Status  Achieved      PT LONG TERM GOAL #2   Title  Patient will improve all shoulder strength 4+/5 for flexion and rotation to improve ability to  manipulate objects in space.     Baseline  3/5 - 3+/5 within all measured motions; 09/14/2017: 3+/5 with all motion; 10/28/2017: 3+/5 for rotational movement and flexion improvement with extension and adduction to 4-/5; 4-/5 to 3+/5 for all shoulder movements; 02/07/2018: 4-/5 rotational movement IR/ER, 4/5 for flexion and abduction; 03/10/2018: 4/5-4+/5; 06/07/2018: 4/5- 3/5 B; 07/12/2018: 4+/5 for flexion, 4/5 for abduction on L shoulder    Time  8    Period  Weeks    Status  On-going      PT LONG TERM GOAL #3   Title  Patient will  be independent with home program for strength and pain control for left UE/shoulder by 04/17/2015    Baseline  limited knowledge of pain control strategies or progression of exercises for left UE; 09/14/2017: Requires moderate cueing for exercise progression; 10/28/2017: Continues to require minimal cueing to correct; 12/23/2017: continues to require moderate cueing for exercise performance;  02/07/2018: independent with HEP    Time  8    Period  Weeks    Status  Achieved      PT LONG TERM GOAL #4   Title  Patient will have a worst pain of a 3/10 to allow for performance of household activities without increased pain    Baseline  8/10; 12/23/2017: 8/10; 02/07/2018: 7/10 Both shoulder and LBP; 03/10/2018: 6/10; 06/07/2018: 10/10; 07/12/2018: 7/10    Time  8    Period  Weeks    Status  On-going      PT LONG TERM GOAL #5   Title  Patient will be able to stand for over 5 hours without increase in pain to allow for cooking for long periods of time wihtout increase in pain    Baseline  Increased pain after 4 hours from standing; 12/23/2017: Able to perform 5 hours of cooking with increased pain; 02/07/2018: Has not been cooking secondary to family difficulties; 03/10/2018: can cook 4 hours on cooking but has increased pain at after 4 hours; 06/07/2018: unable to test secondary to shoulder pain; 07/12/2018: reports she is able to perform with mild discomfort    Time  8    Period  Weeks    Status  Achieved      PT LONG TERM GOAL #6   Title  Patient will improve her MODI to under 48% to indicate significant improvement in lower back function and improvement with cooking in standing.    Baseline  58% MODI dysfunction; 03/10/2018: 52%: MODI dysfunction; 06/07/2018: will address NV; 07/11/18: 32%    Time  6    Period  Weeks    Status  Achieved            Plan - 07/26/18 1521    Clinical Impression Statement  Patient demonstrates improvement with exercises today with ability to perform a greater amount of shoulder flexion compared to previous sessions. Patient demonstrates improvement however, continues to have difficulty with performing repeated shoulder flexion in standing secondary to fatigue. Patient will benefit from furhter skilled therapy to return to prior level of function.    Personal Factors and Comorbidities  Age;Behavior Pattern;Past/Current Experience    Examination-Activity  Limitations  Carry;Sleep;Lift    Examination-Participation Restrictions  Community Activity    Stability/Clinical Decision Making  Stable/Uncomplicated    Rehab Potential  Fair    Clinical Impairments Affecting Rehab Potential  (+) highly motivated, (-) age, decreased strength    PT Frequency  2x / week    PT Duration  8 weeks  PT Treatment/Interventions  Passive range of motion;Manual techniques;Electrical Stimulation;Cryotherapy;Moist Heat;Ultrasound;Therapeutic exercise;Therapeutic activities;Neuromuscular re-education;Patient/family education;Scar mobilization;Dry needling;Iontophoresis 4mg /ml Dexamethasone    PT Next Visit Plan  progress AROM exercises    PT Home Exercise Plan  See education section    Consulted and Agree with Plan of Care  Patient       Patient will benefit from skilled therapeutic intervention in order to improve the following deficits and impairments:  Pain, Impaired sensation, Decreased coordination, Decreased mobility, Increased muscle spasms, Decreased activity tolerance, Decreased endurance, Decreased range of motion, Decreased strength  Visit Diagnosis: 1. Muscle weakness (generalized)   2. Stiffness of left shoulder, not elsewhere classified   3. Chronic left shoulder pain        Problem List There are no active problems to display for this patient.   Myrene GalasWesley Rishit Burkhalter, PT DPT 07/26/2018, 3:32 PM  [facility] [address] [place], [place], [address] Phone: [phone]   Fax:  [phone]  Name: Henreitta LeberDeborah L Asa MRN: [id number] Date of Birth: 19-Jun-1950

## 2018-07-28 ENCOUNTER — Other Ambulatory Visit: Payer: Self-pay

## 2018-07-28 ENCOUNTER — Ambulatory Visit: Payer: Medicare Other

## 2018-07-28 DIAGNOSIS — G8929 Other chronic pain: Secondary | ICD-10-CM

## 2018-07-28 DIAGNOSIS — M6281 Muscle weakness (generalized): Secondary | ICD-10-CM | POA: Diagnosis not present

## 2018-07-28 DIAGNOSIS — M25612 Stiffness of left shoulder, not elsewhere classified: Secondary | ICD-10-CM

## 2018-07-28 NOTE — Therapy (Signed)
[place] [facility] [address] [place], [address] Phone: [phone]   Fax:  [phone]  Physical Therapy Treatment  Patient Details  Name: Suzanne Snyder MRN: [id number] Date of Birth: [birth year] Referring Provider (PT): Kathyrn Sheriff MD   Encounter Date: 07/28/2018  PT End of Session - 07/28/18 1437    Visit Number  43    Number of Visits  46    Date for PT Re-Evaluation  08/09/18    Authorization Type  5/10    PT Start Time  1345    PT Stop Time  1430    PT Time Calculation (min)  45 min    Activity Tolerance  Patient tolerated treatment well    Behavior During Therapy  [facility] for tasks assessed/performed       Past Medical History:  Diagnosis Date  . Anxiety   . Diabetes mellitus without complication ([place])   . Osteoporosis     Past Surgical History:  Procedure Laterality Date  . 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 ROTATOR CUFF REPAIR Left 2014 and 2015  . SPINE SURGERY N/A 1984   L4-5, not sure of procedure    There were no vitals filed for this visit.  Subjective Assessment - 07/28/18 1357    Subjective  Patient states no increase in pain compared to previous session. Patient reports she has been busy but have not had a significant increase in pain.    Pertinent History  Chronic history of L shoulder pain, sciolosis, laminectomy     Limitations  Lifting;Sitting    Patient Stated Goals  To improve shoulder function and decrease pain    Currently in Pain?  Yes    Pain Score  4     Pain Location  Shoulder    Pain Orientation  Left    Pain Descriptors / Indicators  Aching    Pain Type  Chronic pain    Pain Onset  More than a month ago    Pain Frequency  Intermittent       TREATMENT Therapeutic Exercise Pulleys in standing in sitting -- x 67min UE ranger in standing -- x 20 flexion UE ranger in standing -- x20 flexion into ER/IR Shoulder rows in sitting with YTB -- x 20 Shoulder high  rows in sitting with YTB -- x 20 Thoracic rotation -- x 20 with YTB  Shoulder extension -- x20 with YTB   Performed shoulder based exercises to improve strength most notably with overhead motion   PT Education - 07/28/18 1358    Education Details  form/technique with exercise    Person(s) Educated  Patient    Methods  Explanation;Demonstration    Comprehension  Verbalized understanding;Returned demonstration          PT Long Term Goals - 07/12/18 0819      PT LONG TERM GOAL #1   Title  Patient will improve AROM to 120 degrees left shoulder with mild discomfort for reaching into cabinet or performing hair care     Baseline  95 AROM in sitting; 09/14/2017: 105 deg; 10/28/2017: 115; 12/23/2017: 120    Time  8    Period  Weeks    Status  Achieved      PT LONG TERM GOAL #2   Title  Patient will improve all shoulder strength 4+/5 for flexion and rotation to improve ability to manipulate objects in space.     Baseline  3/5 -  3+/5 within all measured motions; 09/14/2017: 3+/5 with all motion; 10/28/2017: 3+/5 for rotational movement and flexion improvement with extension and adduction to 4-/5; 4-/5 to 3+/5 for all shoulder movements; 02/07/2018: 4-/5 rotational movement IR/ER, 4/5 for flexion and abduction; 03/10/2018: 4/5-4+/5; 06/07/2018: 4/5- 3/5 B; 07/12/2018: 4+/5 for flexion, 4/5 for abduction on L shoulder    Time  8    Period  Weeks    Status  On-going      PT LONG TERM GOAL #3   Title  Patient will  be independent with home program for strength and pain control for left UE/shoulder by 04/17/2015    Baseline  limited knowledge of pain control strategies or progression of exercises for left UE; 09/14/2017: Requires moderate cueing for exercise progression; 10/28/2017: Continues to require minimal cueing to correct; 12/23/2017: continues to require moderate cueing for exercise performance; 02/07/2018: independent with HEP    Time  8    Period  Weeks    Status  Achieved      PT LONG TERM  GOAL #4   Title  Patient will have a worst pain of a 3/10 to allow for performance of household activities without increased pain    Baseline  8/10; 12/23/2017: 8/10; 02/07/2018: 7/10 Both shoulder and LBP; 03/10/2018: 6/10; 06/07/2018: 10/10; 07/12/2018: 7/10    Time  8    Period  Weeks    Status  On-going      PT LONG TERM GOAL #5   Title  Patient will be able to stand for over 5 hours without increase in pain to allow for cooking for long periods of time wihtout increase in pain    Baseline  Increased pain after 4 hours from standing; 12/23/2017: Able to perform 5 hours of cooking with increased pain; 02/07/2018: Has not been cooking secondary to family difficulties; 03/10/2018: can cook 4 hours on cooking but has increased pain at after 4 hours; 06/07/2018: unable to test secondary to shoulder pain; 07/12/2018: reports she is able to perform with mild discomfort    Time  8    Period  Weeks    Status  Achieved      PT LONG TERM GOAL #6   Title  Patient will improve her MODI to under 48% to indicate significant improvement in lower back function and improvement with cooking in standing.    Baseline  58% MODI dysfunction; 03/10/2018: 52%: MODI dysfunction; 06/07/2018: will address NV; 07/11/18: 32%    Time  6    Period  Weeks    Status  Achieved            Plan - 07/28/18 1437    Clinical Impression Statement  Patient demosntrates further improvement in symptoms and is able to perform greater amount of reps with UE ranger compared to rpevious sessions indicating functional carryover between sessions. Patient continues to demonstrate poor muscular endurance of the affected shoulder requiring frequent sitting breaks during the session. Patient will benefit from further skilled therapy focused on improving limitations to improve shoulder strength and return to prior level of function.    Personal Factors and Comorbidities  Age;Behavior Pattern;Past/Current Experience    Examination-Activity  Limitations  Carry;Sleep;Lift    Examination-Participation Restrictions  Community Activity    Stability/Clinical Decision Making  Stable/Uncomplicated    Rehab Potential  Fair    Clinical Impairments Affecting Rehab Potential  (+) highly motivated, (-) age, decreased strength    PT Frequency  2x / week    PT Duration  8 weeks    PT Treatment/Interventions  Passive range of motion;Manual techniques;Electrical Stimulation;Cryotherapy;Moist Heat;Ultrasound;Therapeutic exercise;Therapeutic activities;Neuromuscular re-education;Patient/family education;Scar mobilization;Dry needling;Iontophoresis 4mg /ml Dexamethasone    PT Next Visit Plan  progress AROM exercises    PT Home Exercise Plan  See education section    Consulted and Agree with Plan of Care  Patient       Patient will benefit from skilled therapeutic intervention in order to improve the following deficits and impairments:  Pain, Impaired sensation, Decreased coordination, Decreased mobility, Increased muscle spasms, Decreased activity tolerance, Decreased endurance, Decreased range of motion, Decreased strength  Visit Diagnosis: 1. Muscle weakness (generalized)   2. Stiffness of left shoulder, not elsewhere classified   3. Chronic left shoulder pain        Problem List There are no active problems to display for this patient.   Myrene GalasWesley Neima Lacross, PT DPT 07/28/2018, 2:43 PM  [facility] [facility] [address] [place], [place], [address] Phone: [phone]   Fax:  [phone]  Name: Suzanne Snyder MRN: [id number] Date of Birth: 1951-01-11

## 2018-08-02 ENCOUNTER — Ambulatory Visit: Payer: Medicare Other

## 2018-08-02 ENCOUNTER — Other Ambulatory Visit: Payer: Self-pay

## 2018-08-02 DIAGNOSIS — M6281 Muscle weakness (generalized): Secondary | ICD-10-CM | POA: Diagnosis not present

## 2018-08-02 DIAGNOSIS — G8929 Other chronic pain: Secondary | ICD-10-CM

## 2018-08-02 DIAGNOSIS — M25612 Stiffness of left shoulder, not elsewhere classified: Secondary | ICD-10-CM

## 2018-08-02 DIAGNOSIS — M25512 Pain in left shoulder: Secondary | ICD-10-CM

## 2018-08-03 NOTE — Therapy (Signed)
[place] [facility] [address] [place], [place], [address] Phone: [phone]   Fax:  [phone]  Physical Therapy Treatment  Patient Details  Name: Suzanne Snyder MRN: [id number] Date of Birth: 08-23-1950 Referring Provider (PT): Olga MillersKamath MD   Encounter Date: 08/02/2018  PT End of Session - 08/03/18 1039    Visit Number  44    Number of Visits  46    Date for PT Re-Evaluation  08/09/18    Authorization Type  6/10    PT Start Time  1345    PT Stop Time  1430    PT Time Calculation (min)  45 min    Activity Tolerance  Patient tolerated treatment well    Behavior During Therapy  [facility]       Past Medical History:  Diagnosis Date  . Anxiety   . Diabetes mellitus without complication ([facility])   . Osteoporosis     Past Surgical History:  Procedure Laterality Date  . 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 ROTATOR CUFF REPAIR Left 2014 and 2015  . SPINE SURGERY N/A 1984   L4-5, not sure of procedure    There were no vitals filed for this visit.  Subjective Assessment - 08/03/18 1036    Subjective  Patient states no major changes and she is slowly improving. Patient states her daughter is coming over and would like to end the session a little early to see her.    Pertinent History  Chronic history of L shoulder pain, sciolosis, laminectomy     Limitations  Lifting;Sitting    Patient Stated Goals  To improve shoulder function and decrease pain    Currently in Pain?  Yes    Pain Score  4     Pain Location  Shoulder    Pain Orientation  Left    Pain Descriptors / Indicators  Aching    Pain Type  Chronic pain    Pain Onset  More than a month ago    Pain Frequency  Intermittent       TREATMENT Therapeutic Exercise UE ranger flexion with 2# pound - x 15 B Scapular retraction with row with GTB - x 20 B Shoulder extension in standing with GTB - x 20 B Elbow/shoulder extension in  sitting with GTB - x 20 B Finger pinches in sitting (black, blue and green clips) - x5 in each color Finger squeezes - x 3 with 5 sec holds  Performed exercises in sitting and standing to decrease pain and improve R and L shoulder strength    PT Education - 08/03/18 1038    Education Details  form/tehnique with exercise    Person(s) Educated  Patient    Methods  Explanation;Demonstration    Comprehension  Verbalized understanding;Returned demonstration          PT Long Term Goals - 07/12/18 0819      PT LONG TERM GOAL #1   Title  Patient will improve AROM to 120 degrees left shoulder with mild discomfort for reaching into cabinet or performing hair care     Baseline  95 AROM in sitting; 09/14/2017: 105 deg; 10/28/2017: 115; 12/23/2017: 120    Time  8    Period  Weeks    Status  Achieved      PT LONG TERM GOAL #2   Title  Patient will improve all shoulder strength 4+/5 for flexion and rotation to improve  ability to manipulate objects in space.     Baseline  3/5 - 3+/5 within all measured motions; 09/14/2017: 3+/5 with all motion; 10/28/2017: 3+/5 for rotational movement and flexion improvement with extension and adduction to 4-/5; 4-/5 to 3+/5 for all shoulder movements; 02/07/2018: 4-/5 rotational movement IR/ER, 4/5 for flexion and abduction; 03/10/2018: 4/5-4+/5; 06/07/2018: 4/5- 3/5 B; 07/12/2018: 4+/5 for flexion, 4/5 for abduction on L shoulder    Time  8    Period  Weeks    Status  On-going      PT LONG TERM GOAL #3   Title  Patient will  be independent with home program for strength and pain control for left UE/shoulder by 04/17/2015    Baseline  limited knowledge of pain control strategies or progression of exercises for left UE; 09/14/2017: Requires moderate cueing for exercise progression; 10/28/2017: Continues to require minimal cueing to correct; 12/23/2017: continues to require moderate cueing for exercise performance; 02/07/2018: independent with HEP    Time  8    Period   Weeks    Status  Achieved      PT LONG TERM GOAL #4   Title  Patient will have a worst pain of a 3/10 to allow for performance of household activities without increased pain    Baseline  8/10; 12/23/2017: 8/10; 02/07/2018: 7/10 Both shoulder and LBP; 03/10/2018: 6/10; 06/07/2018: 10/10; 07/12/2018: 7/10    Time  8    Period  Weeks    Status  On-going      PT LONG TERM GOAL #5   Title  Patient will be able to stand for over 5 hours without increase in pain to allow for cooking for long periods of time wihtout increase in pain    Baseline  Increased pain after 4 hours from standing; 12/23/2017: Able to perform 5 hours of cooking with increased pain; 02/07/2018: Has not been cooking secondary to family difficulties; 03/10/2018: can cook 4 hours on cooking but has increased pain at after 4 hours; 06/07/2018: unable to test secondary to shoulder pain; 07/12/2018: reports she is able to perform with mild discomfort    Time  8    Period  Weeks    Status  Achieved      PT LONG TERM GOAL #6   Title  Patient will improve her MODI to under 48% to indicate significant improvement in lower back function and improvement with cooking in standing.    Baseline  58% MODI dysfunction; 03/10/2018: 52%: MODI dysfunction; 06/07/2018: will address NV; 07/11/18: 32%    Time  6    Period  Weeks    Status  Achieved            Plan - 08/03/18 1119    Clinical Impression Statement  Patient demonstrates abiilty to perform shoulder flexion with 2# weight compared to previous session where she was only able to perform the motion without any resistance. Although patient is improving, she continues to have difficulty with performing frequent repetitions secondary to fatigue and increase in pain. Patient is improving overall but continues to have limitations with strength and coordination. Patient will benefit from further skilled therapy to return to prior level of function.    Personal Factors and Comorbidities  Age;Behavior  Pattern;Past/Current Experience    Examination-Activity Limitations  Carry;Sleep;Lift    Examination-Participation Restrictions  Community Activity    Stability/Clinical Decision Making  Stable/Uncomplicated    Rehab Potential  Fair    Clinical Impairments Affecting Rehab Potential  (+)  highly motivated, (-) age, decreased strength    PT Frequency  2x / week    PT Duration  8 weeks    PT Treatment/Interventions  Passive range of motion;Manual techniques;Electrical Stimulation;Cryotherapy;Moist Heat;Ultrasound;Therapeutic exercise;Therapeutic activities;Neuromuscular re-education;Patient/family education;Scar mobilization;Dry needling;Iontophoresis 4mg /ml Dexamethasone    PT Next Visit Plan  progress AROM exercises    PT Home Exercise Plan  See education section    Consulted and Agree with Plan of Care  Patient       Patient will benefit from skilled therapeutic intervention in order to improve the following deficits and impairments:  Pain, Impaired sensation, Decreased coordination, Decreased mobility, Increased muscle spasms, Decreased activity tolerance, Decreased endurance, Decreased range of motion, Decreased strength  Visit Diagnosis: 1. Muscle weakness (generalized)   2. Stiffness of left shoulder, not elsewhere classified   3. Chronic left shoulder pain        Problem List There are no active problems to display for this patient.   Myrene GalasWesley Bluma Buresh, PT DPT 08/03/2018, 11:29 AM  [place] [facility] [address] , [place], [address] Phone: [phone]   Fax:  [phone]  Name: Suzanne Snyder MRN: [id number] Date of Birth: 27-Jun-1950

## 2018-08-04 ENCOUNTER — Other Ambulatory Visit: Payer: Self-pay

## 2018-08-04 ENCOUNTER — Ambulatory Visit: Payer: Medicare Other

## 2018-08-04 DIAGNOSIS — M6281 Muscle weakness (generalized): Secondary | ICD-10-CM

## 2018-08-04 DIAGNOSIS — M25612 Stiffness of left shoulder, not elsewhere classified: Secondary | ICD-10-CM

## 2018-08-04 NOTE — Therapy (Signed)
[place] [facility] [address] [place], [place], [address] Phone: [phone]   Fax:  [phone]  Physical Therapy Treatment  Patient Details  Name: Suzanne LeberDeborah L Wedge MRN: [id number] Date of Birth: 1950-02-03 Referring Provider (PT): Olga MillersKamath MD   Encounter Date: 08/04/2018  PT End of Session - 08/04/18 1644    Visit Number  45    Number of Visits  46    Date for PT Re-Evaluation  08/09/18    Authorization Type  7/10    PT Start Time  1345    PT Stop Time  1430    PT Time Calculation (min)  45 min    Activity Tolerance  Patient tolerated treatment well    Behavior During Therapy  [facility] assessed/performed       Past Medical History:  Diagnosis Date  . Anxiety   . Diabetes mellitus without complication (HCC)   . Osteoporosis     Past Surgical History:  Procedure Laterality Date  . 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 ROTATOR CUFF REPAIR Left 2014 and 2015  . SPINE SURGERY N/A 1984   L4-5, not sure of procedure    There were no vitals filed for this visit.  Subjective Assessment - 08/04/18 1642    Subjective  Patient reports she only slept for 45 minutes last night and went to her doctor who told her he would like her to have a nerve conduction study. Patient states she is worried about the pain associated with performing the study.    Pertinent History  Chronic history of L shoulder pain, sciolosis, laminectomy     Limitations  Lifting;Sitting    Patient Stated Goals  To improve shoulder function and decrease pain    Currently in Pain?  Yes    Pain Score  4     Pain Location  Shoulder    Pain Orientation  Left    Pain Descriptors / Indicators  Aching    Pain Type  Chronic pain    Pain Onset  More than a month ago    Pain Frequency  Intermittent       TREATMENT Therapeutic Exercise Cervical retractors in sitting - x 20  SNAGS in rotation with use of a towel - x 20 SNAGS in extension with  use of a towel - x 20 Shoulder pulleys in sitting - x 20  Deep neck flexor activation in sitting - x 20  UE ranger in standing in flexion- x 20 with 2# UE ranger in standing with performing semi circles - x 20  Patient demonstrates improvement overall with exercises. Performed exercises to improve L shoulder strength and AROM.   PT Education - 08/04/18 1643    Education Details  form/technique with exercise    Person(s) Educated  Patient    Methods  Explanation;Demonstration    Comprehension  Verbalized understanding;Returned demonstration          PT Long Term Goals - 07/12/18 0819      PT LONG TERM GOAL #1   Title  Patient will improve AROM to 120 degrees left shoulder with mild discomfort for reaching into cabinet or performing hair care     Baseline  95 AROM in sitting; 09/14/2017: 105 deg; 10/28/2017: 115; 12/23/2017: 120    Time  8    Period  Weeks    Status  Achieved      PT LONG TERM GOAL #2  Title  Patient will improve all shoulder strength 4+/5 for flexion and rotation to improve ability to manipulate objects in space.     Baseline  3/5 - 3+/5 within all measured motions; 09/14/2017: 3+/5 with all motion; 10/28/2017: 3+/5 for rotational movement and flexion improvement with extension and adduction to 4-/5; 4-/5 to 3+/5 for all shoulder movements; 02/07/2018: 4-/5 rotational movement IR/ER, 4/5 for flexion and abduction; 03/10/2018: 4/5-4+/5; 06/07/2018: 4/5- 3/5 B; 07/12/2018: 4+/5 for flexion, 4/5 for abduction on L shoulder    Time  8    Period  Weeks    Status  On-going      PT LONG TERM GOAL #3   Title  Patient will  be independent with home program for strength and pain control for left UE/shoulder by 04/17/2015    Baseline  limited knowledge of pain control strategies or progression of exercises for left UE; 09/14/2017: Requires moderate cueing for exercise progression; 10/28/2017: Continues to require minimal cueing to correct; 12/23/2017: continues to require moderate  cueing for exercise performance; 02/07/2018: independent with HEP    Time  8    Period  Weeks    Status  Achieved      PT LONG TERM GOAL #4   Title  Patient will have a worst pain of a 3/10 to allow for performance of household activities without increased pain    Baseline  8/10; 12/23/2017: 8/10; 02/07/2018: 7/10 Both shoulder and LBP; 03/10/2018: 6/10; 06/07/2018: 10/10; 07/12/2018: 7/10    Time  8    Period  Weeks    Status  On-going      PT LONG TERM GOAL #5   Title  Patient will be able to stand for over 5 hours without increase in pain to allow for cooking for long periods of time wihtout increase in pain    Baseline  Increased pain after 4 hours from standing; 12/23/2017: Able to perform 5 hours of cooking with increased pain; 02/07/2018: Has not been cooking secondary to family difficulties; 03/10/2018: can cook 4 hours on cooking but has increased pain at after 4 hours; 06/07/2018: unable to test secondary to shoulder pain; 07/12/2018: reports she is able to perform with mild discomfort    Time  8    Period  Weeks    Status  Achieved      PT LONG TERM GOAL #6   Title  Patient will improve her MODI to under 48% to indicate significant improvement in lower back function and improvement with cooking in standing.    Baseline  58% MODI dysfunction; 03/10/2018: 52%: MODI dysfunction; 06/07/2018: will address NV; 07/11/18: 32%    Time  6    Period  Weeks    Status  Achieved            Plan - 08/04/18 1645    Clinical Impression Statement  Performed greater amount of cervical exercises today to address radicular symptoms extending down the UEs. Patient tolerates these well and is able to perform without any increase in pain. Patient is also improving in terms of L shoulder strength with ability to perform greater amount of repetitions  compared to previous sessions with a 2# weight. Patient is improving overall but continues to have increased pain with L shoulder flexion most notably against  resistance. Patient will benefit from further skilled therapy to return to prior level of function.    Personal Factors and Comorbidities  Age;Behavior Pattern;Past/Current Experience    Examination-Activity Limitations  Carry;Sleep;Lift  Examination-Participation Restrictions  Community Activity    Stability/Clinical Decision Making  Stable/Uncomplicated    Rehab Potential  Fair    Clinical Impairments Affecting Rehab Potential  (+) highly motivated, (-) age, decreased strength    PT Frequency  2x / week    PT Duration  8 weeks    PT Treatment/Interventions  Passive range of motion;Manual techniques;Electrical Stimulation;Cryotherapy;Moist Heat;Ultrasound;Therapeutic exercise;Therapeutic activities;Neuromuscular re-education;Patient/family education;Scar mobilization;Dry needling;Iontophoresis 4mg /ml Dexamethasone    PT Next Visit Plan  progress AROM exercises    PT Home Exercise Plan  See education section    Consulted and Agree with Plan of Care  Patient       Patient will benefit from skilled therapeutic intervention in order to improve the following deficits and impairments:  Pain, Impaired sensation, Decreased coordination, Decreased mobility, Increased muscle spasms, Decreased activity tolerance, Decreased endurance, Decreased range of motion, Decreased strength  Visit Diagnosis: 1. Muscle weakness (generalized)   2. Stiffness of left shoulder, not elsewhere classified        Problem List There are no active problems to display for this patient.   Myrene GalasWesley Arriana Lohmann, PT DPT 08/04/2018, 4:46 PM  [facility] [facility] [address] [place], [place], [address] Phone: [phone]   Fax:  [phone]  Name: Suzanne LeberDeborah L Somarriba MRN: [id number] Date of Birth: Sep 01, 1950

## 2018-08-09 ENCOUNTER — Other Ambulatory Visit: Payer: Self-pay

## 2018-08-09 ENCOUNTER — Ambulatory Visit: Payer: Medicare Other

## 2018-08-09 DIAGNOSIS — M6281 Muscle weakness (generalized): Secondary | ICD-10-CM

## 2018-08-09 DIAGNOSIS — M25612 Stiffness of left shoulder, not elsewhere classified: Secondary | ICD-10-CM

## 2018-08-09 DIAGNOSIS — G8929 Other chronic pain: Secondary | ICD-10-CM

## 2018-08-09 DIAGNOSIS — M25512 Pain in left shoulder: Secondary | ICD-10-CM

## 2018-08-09 NOTE — Therapy (Signed)
[place] [facility] [address] [place], [place], [address] Phone: [phone]   Fax:  [phone]  Physical Therapy Treatment  Patient Details  Name: Suzanne Snyder MRN: [id number] Date of Birth: [birth year] Referring Provider (PT): Olga MillersKamath MD   Encounter Date: 08/09/2018  PT End of Session - 08/09/18 1407    Visit Number  46    Number of Visits  46    Date for PT Re-Evaluation  08/09/18    Authorization Type  8/10    PT Start Time  1350    PT Stop Time  1430    PT Time Calculation (min)  40 min    Activity Tolerance  Patient tolerated treatment well    Behavior During Therapy  [facility] tasks assessed/performed       Past Medical History:  Diagnosis Date  . Anxiety   . Diabetes mellitus without complication (HCC)   . Osteoporosis     Past Surgical History:  Procedure Laterality Date  . 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 ROTATOR CUFF REPAIR Left 2014 and 2015  . SPINE SURGERY N/A 1984   L4-5, not sure of procedure    There were no vitals filed for this visit.  Subjective Assessment - 08/09/18 1357    Subjective  Patient states she "overworked" her L shoulder today and states it feels sore today. Patient states no major changes otherwise.    Pertinent History  Chronic history of L shoulder pain, sciolosis, laminectomy     Limitations  Lifting;Sitting    Patient Stated Goals  To improve shoulder function and decrease pain    Currently in Pain?  Yes    Pain Score  6     Pain Location  Shoulder    Pain Orientation  Left    Pain Descriptors / Indicators  Aching    Pain Type  Chronic pain    Pain Onset  More than a month ago    Pain Frequency  Intermittent       TREATMENT  Therapeutic Exercise  Shoulder pulleys in sitting - x 20  Cervical retractors in sitting - x 20  SNAGS in rotation with use of a towel - x 20  Clips all colors  with pinching - x 20 (blue, black, red,  SNAGS in extension with  use of a towel - x 20  Shoulder rows with GTB - x 20 with elbows extension  Patient demonstrates improvement overall with exercises. Performed exercises to improve L shoulder strength and AROM.   PT Education - 08/09/18 1401    Education Details  form/technique with exercise    Person(s) Educated  Patient    Methods  Explanation;Demonstration    Comprehension  Verbalized understanding;Returned demonstration          PT Long Term Goals - 07/12/18 0819      PT LONG TERM GOAL #1   Title  Patient will improve AROM to 120 degrees left shoulder with mild discomfort for reaching into cabinet or performing hair care     Baseline  95 AROM in sitting; 09/14/2017: 105 deg; 10/28/2017: 115; 12/23/2017: 120    Time  8    Period  Weeks    Status  Achieved      PT LONG TERM GOAL #2   Title  Patient will improve all shoulder strength 4+/5 for flexion and rotation to improve ability to manipulate objects in space.  Baseline  3/5 - 3+/5 within all measured motions; 09/14/2017: 3+/5 with all motion; 10/28/2017: 3+/5 for rotational movement and flexion improvement with extension and adduction to 4-/5; 4-/5 to 3+/5 for all shoulder movements; 02/07/2018: 4-/5 rotational movement IR/ER, 4/5 for flexion and abduction; 03/10/2018: 4/5-4+/5; 06/07/2018: 4/5- 3/5 B; 07/12/2018: 4+/5 for flexion, 4/5 for abduction on L shoulder    Time  8    Period  Weeks    Status  On-going      PT LONG TERM GOAL #3   Title  Patient will  be independent with home program for strength and pain control for left UE/shoulder by 04/17/2015    Baseline  limited knowledge of pain control strategies or progression of exercises for left UE; 09/14/2017: Requires moderate cueing for exercise progression; 10/28/2017: Continues to require minimal cueing to correct; 12/23/2017: continues to require moderate cueing for exercise performance; 02/07/2018: independent with HEP    Time  8    Period  Weeks    Status  Achieved      PT LONG TERM GOAL  #4   Title  Patient will have a worst pain of a 3/10 to allow for performance of household activities without increased pain    Baseline  8/10; 12/23/2017: 8/10; 02/07/2018: 7/10 Both shoulder and LBP; 03/10/2018: 6/10; 06/07/2018: 10/10; 07/12/2018: 7/10    Time  8    Period  Weeks    Status  On-going      PT LONG TERM GOAL #5   Title  Patient will be able to stand for over 5 hours without increase in pain to allow for cooking for long periods of time wihtout increase in pain    Baseline  Increased pain after 4 hours from standing; 12/23/2017: Able to perform 5 hours of cooking with increased pain; 02/07/2018: Has not been cooking secondary to family difficulties; 03/10/2018: can cook 4 hours on cooking but has increased pain at after 4 hours; 06/07/2018: unable to test secondary to shoulder pain; 07/12/2018: reports she is able to perform with mild discomfort    Time  8    Period  Weeks    Status  Achieved      PT LONG TERM GOAL #6   Title  Patient will improve her MODI to under 48% to indicate significant improvement in lower back function and improvement with cooking in standing.    Baseline  58% MODI dysfunction; 03/10/2018: 52%: MODI dysfunction; 06/07/2018: will address NV; 07/11/18: 32%    Time  6    Period  Weeks    Status  Achieved            Plan - 08/09/18 1442    Clinical Impression Statement  Performed greater amount of exercises utilizing hand and neck exercises secondary to increased L shoulder soreness. Patient demonstrates improvement with ability to perform pinching on the R side compared to previous session indicating funcitonal carryover between sessions. Patient continues to have difficulty with performing shoulder flexion on the L side limiting ability to cook and perform ADLs around the home. Patient will benefit from further skilled therapy to return to prior level of function.    Personal Factors and Comorbidities  Age;Behavior Pattern;Past/Current Experience     Examination-Activity Limitations  Carry;Sleep;Lift    Examination-Participation Restrictions  Community Activity    Stability/Clinical Decision Making  Stable/Uncomplicated    Rehab Potential  Fair    Clinical Impairments Affecting Rehab Potential  (+) highly motivated, (-) age, decreased strength  PT Frequency  2x / week    PT Duration  8 weeks    PT Treatment/Interventions  Passive range of motion;Manual techniques;Electrical Stimulation;Cryotherapy;Moist Heat;Ultrasound;Therapeutic exercise;Therapeutic activities;Neuromuscular re-education;Patient/family education;Scar mobilization;Dry needling;Iontophoresis 4mg /ml Dexamethasone    PT Next Visit Plan  progress AROM exercises    PT Home Exercise Plan  See education section    Consulted and Agree with Plan of Care  Patient       Patient will benefit from skilled therapeutic intervention in order to improve the following deficits and impairments:  Pain, Impaired sensation, Decreased coordination, Decreased mobility, Increased muscle spasms, Decreased activity tolerance, Decreased endurance, Decreased range of motion, Decreased strength  Visit Diagnosis: 1. Muscle weakness (generalized)   2. Stiffness of left shoulder, not elsewhere classified   3. Chronic left shoulder pain        Problem List There are no active problems to display for this patient.   Myrene GalasWesley Francisca Harbuck, PT DPT 08/09/2018, 3:53 PM  [facility] [address] [place], [place], [address] Phone: [phone]   Fax:  [phone]  Name: Suzanne Snyder MRN: [id number] Date of Birth: 1950/07/29

## 2018-08-11 ENCOUNTER — Ambulatory Visit: Payer: Medicare Other

## 2018-08-11 ENCOUNTER — Other Ambulatory Visit: Payer: Self-pay

## 2018-08-11 DIAGNOSIS — G8929 Other chronic pain: Secondary | ICD-10-CM

## 2018-08-11 DIAGNOSIS — M25612 Stiffness of left shoulder, not elsewhere classified: Secondary | ICD-10-CM

## 2018-08-11 DIAGNOSIS — M6281 Muscle weakness (generalized): Secondary | ICD-10-CM | POA: Diagnosis not present

## 2018-08-11 NOTE — Addendum Note (Signed)
Addended by: Blain Pais on: 08/11/2018 05:12 PM   Modules accepted: Orders

## 2018-08-11 NOTE — Therapy (Signed)
[place] [facility] [address] [place], [place], [address] Phone: [phone]   Fax:  [phone]  Physical Therapy Treatment  Patient Details  Name: Suzanne LeberDeborah L Moulder MRN: [id number] Date of Birth: Aug 14, 1950 Referring Provider (PT): Olga MillersKamath MD   Encounter Date: 08/11/2018  PT End of Session - 08/11/18 1551    Visit Number  47    Number of Visits  54    Date for PT Re-Evaluation  08/09/18    Authorization Type  9/10    PT Start Time  1345    PT Stop Time  1415    PT Time Calculation (min)  30 min    Activity Tolerance  Patient tolerated treatment well    Behavior During Therapy  [facility] for tasks assessed/performed       Past Medical History:  Diagnosis Date  . Anxiety   . Diabetes mellitus without complication (HCC)   . Osteoporosis     Past Surgical History:  Procedure Laterality Date  . 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 ROTATOR CUFF REPAIR Left 2014 and 2015  . SPINE SURGERY N/A 1984   L4-5, not sure of procedure    There were no vitals filed for this visit.  Subjective Assessment - 08/11/18 1535    Subjective  Patient reports her shoulder is feeling good today and states she feels like she is improving overall but continues to be unable to perform a lot of functional overhead activities such as lifting items overhead for cooking.    Pertinent History  Chronic history of L shoulder pain, sciolosis, laminectomy     Limitations  Lifting;Sitting    Patient Stated Goals  To improve shoulder function and decrease pain    Currently in Pain?  Yes    Pain Score  5     Pain Location  Shoulder    Pain Orientation  Left    Pain Descriptors / Indicators  Aching    Pain Type  Chronic pain    Pain Onset  More than a month ago    Pain Frequency  Intermittent       TREATMENT Therapeutic Exercise Shoulder flexion in sitting -- x 20 on the L and R UE Educated on exercises to be performed at home -- x  20 Educated patient and reviewed technique for opening a jar and techniques to lifting items overhead Educated through techniques for performing cooking in sitting  *Performed shortened session today secondary to a family emergency*   PT Education - 08/11/18 1551    Education Details  form/technique with exercise    Person(s) Educated  Patient    Methods  Explanation;Demonstration    Comprehension  Verbalized understanding;Returned demonstration          PT Long Term Goals - 08/11/18 1406      PT LONG TERM GOAL #1   Title  Patient will improve AROM to 120 degrees left shoulder with mild discomfort for reaching into cabinet or performing hair care     Baseline  95 AROM in sitting; 09/14/2017: 105 deg; 10/28/2017: 115; 12/23/2017: 120    Time  8    Period  Weeks    Status  Achieved      PT LONG TERM GOAL #2   Title  Patient will improve all shoulder strength 4+/5 for flexion and rotation to improve ability to manipulate objects in space.     Baseline  3/5 - 3+/5 within all  measured motions; 09/14/2017: 3+/5 with all motion; 10/28/2017: 3+/5 for rotational movement and flexion improvement with extension and adduction to 4-/5; 4-/5 to 3+/5 for all shoulder movements; 02/07/2018: 4-/5 rotational movement IR/ER, 4/5 for flexion and abduction; 03/10/2018: 4/5-4+/5; 06/07/2018: 4/5- 3/5 B; 07/12/2018: 4+/5 for flexion, 4/5 for abduction on L shoulder 08/11/2018: 4+/5 for both flexion/abduction    Time  8    Period  Weeks    Status  On-going      PT LONG TERM GOAL #3   Title  Patient will  be independent with home program for strength and pain control for left UE/shoulder by 04/17/2015    Baseline  limited knowledge of pain control strategies or progression of exercises for left UE; 09/14/2017: Requires moderate cueing for exercise progression; 10/28/2017: Continues to require minimal cueing to correct; 12/23/2017: continues to require moderate cueing for exercise performance; 02/07/2018: independent  with HEP    Time  8    Period  Weeks    Status  Achieved      PT LONG TERM GOAL #4   Title  Patient will have a worst pain of a 3/10 to allow for performance of household activities without increased pain    Baseline  8/10; 12/23/2017: 8/10; 02/07/2018: 7/10 Both shoulder and LBP; 03/10/2018: 6/10; 06/07/2018: 10/10; 07/12/2018: 7/10; 08/11/2018: 7/10    Time  8    Period  Weeks    Status  On-going      PT LONG TERM GOAL #5   Title  Patient will be able to stand for over 5 hours without increase in pain to allow for cooking for long periods of time wihtout increase in pain    Baseline  Increased pain after 4 hours from standing; 12/23/2017: Able to perform 5 hours of cooking with increased pain; 02/07/2018: Has not been cooking secondary to family difficulties; 03/10/2018: can cook 4 hours on cooking but has increased pain at after 4 hours; 06/07/2018: unable to test secondary to shoulder pain; 07/12/2018: reports she is able to perform with mild discomfort    Time  8    Period  Weeks    Status  Achieved      Additional Long Term Goals   Additional Long Term Goals  Yes      PT LONG TERM GOAL #6   Title  Patient will improve her MODI to under 48% to indicate significant improvement in lower back function and improvement with cooking in standing.    Baseline  58% MODI dysfunction; 03/10/2018: 52%: MODI dysfunction; 06/07/2018: will address NV; 07/11/18: 32%    Time  6    Period  Weeks    Status  Achieved      PT LONG TERM GOAL #7   Title  Patient will improve QuickDASH to under 49% to indicate significant improvement in L shoulder pain.    Baseline  59%    Time  4    Period  Weeks    Status  New            Plan - 08/11/18 1553    Clinical Impression Statement  Patient is making progress towards long term goals with greater L shoulder strength and AROM along the L shoulder compared to previous sessions. Although patient is improving, she continues to have increased pain and spasms along  the anterior aspect of the shoulder and difficulty with performing washing her back, heavy household chores and tighten a jar. Patient limitations in strength and muscular endurance limit  strength overall limiting her overall with strength. Patient will benefit from further skilled therapy to return to prior level of function.    Personal Factors and Comorbidities  Age;Behavior Pattern;Past/Current Experience    Examination-Activity Limitations  Carry;Sleep;Lift    Examination-Participation Restrictions  Community Activity    Stability/Clinical Decision Making  Stable/Uncomplicated    Rehab Potential  Fair    Clinical Impairments Affecting Rehab Potential  (+) highly motivated, (-) age, decreased strength    PT Frequency  2x / week    PT Duration  8 weeks    PT Treatment/Interventions  Passive range of motion;Manual techniques;Electrical Stimulation;Cryotherapy;Moist Heat;Ultrasound;Therapeutic exercise;Therapeutic activities;Neuromuscular re-education;Patient/family education;Scar mobilization;Dry needling;Iontophoresis 4mg /ml Dexamethasone    PT Next Visit Plan  progress AROM exercises    PT Home Exercise Plan  See education section    Consulted and Agree with Plan of Care  Patient       Patient will benefit from skilled therapeutic intervention in order to improve the following deficits and impairments:  Pain, Impaired sensation, Decreased coordination, Decreased mobility, Increased muscle spasms, Decreased activity tolerance, Decreased endurance, Decreased range of motion, Decreased strength  Visit Diagnosis: 1. Muscle weakness (generalized)   2. Stiffness of left shoulder, not elsewhere classified   3. Chronic left shoulder pain        Problem List There are no active problems to display for this patient.   Blythe Stanford, PT DPT 08/11/2018, 4:28 PM  [place] [facility] [address], [place], [address] Phone:  [phone]   Fax:  [phone]  Name: ELIANNY BUXBAUM MRN: [id number] Date of Birth: 18-Jun-1950

## 2018-08-16 ENCOUNTER — Ambulatory Visit: Payer: Medicare Other | Attending: Orthopedic Surgery

## 2018-08-16 ENCOUNTER — Other Ambulatory Visit: Payer: Self-pay

## 2018-08-16 DIAGNOSIS — M6281 Muscle weakness (generalized): Secondary | ICD-10-CM

## 2018-08-16 DIAGNOSIS — M25512 Pain in left shoulder: Secondary | ICD-10-CM | POA: Diagnosis present

## 2018-08-16 DIAGNOSIS — G8929 Other chronic pain: Secondary | ICD-10-CM

## 2018-08-16 DIAGNOSIS — M25612 Stiffness of left shoulder, not elsewhere classified: Secondary | ICD-10-CM | POA: Insufficient documentation

## 2018-08-17 NOTE — Therapy (Signed)
[facility] [address] [place], [address] Phone: [phone]   Fax:  [phone]  Physical Therapy Treatment  Patient Details  Name: Suzanne Snyder MRN: [id number] Date of Birth: 07/08/1950 Referring Provider (PT): Kathyrn Sheriff MD   Encounter Date: 08/16/2018  PT End of Session - 08/16/18 1725    Visit Number  48    Number of Visits  54    Date for PT Re-Evaluation  09/08/18    Authorization Type  1/10    PT Start Time  1345    PT Stop Time  1430    PT Time Calculation (min)  45 min    Activity Tolerance  Patient tolerated treatment well    Behavior During Therapy  [facility] for tasks assessed/performed       Past Medical History:  Diagnosis Date  . Anxiety   . Diabetes mellitus without complication ([place])   . Osteoporosis     Past Surgical History:  Procedure Laterality Date  . 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 ROTATOR CUFF REPAIR Left 2014 and 2015  . SPINE SURGERY N/A 1984   L4-5, not sure of procedure    There were no vitals filed for this visit.  Subjective Assessment - 08/16/18 1719    Subjective  Patient states her L shoulder and elbow have been bothering her this weekend. Patient states increased pain after trying to flip a batch on apples in a large pot and states her shoulder and elbow flexor tendon bundle has been in increased pain. Patient states no increase significant increase in pain but reports her shoulder has been significantly sore. Patient states she's had a lot of swelling and reddness along her L UE along her shoulder and medial elbow. Patient is worries about her arm and would like it addressed during today's session.    Pertinent History  Chronic history of L shoulder pain, sciolosis, laminectomy     Limitations  Lifting;Sitting    Patient Stated Goals  To improve shoulder function and decrease pain    Currently in Pain?  Yes    Pain Score  5     Pain Location  Shoulder     Pain Orientation  Left    Pain Descriptors / Indicators  Aching    Pain Type  Chronic pain    Pain Onset  More than a month ago      TREATMENT Therapeutic Exercise Pulleys in sitting -- x 10 min in sitting UE ranger in standing with 3# weight -- x 10 flexion Resisted pronation on the L UE -- x 10  Performed exercises to improve shoulder strength and AROM  PT Education - 08/16/18 1724    Education Details  form/technique calling her MD after potential tear of her pronator teres    Person(s) Educated  Patient    Methods  Explanation;Demonstration    Comprehension  Verbalized understanding          PT Long Term Goals - 08/11/18 1406      PT LONG TERM GOAL #1   Title  Patient will improve AROM to 120 degrees left shoulder with mild discomfort for reaching into cabinet or performing hair care     Baseline  95 AROM in sitting; 09/14/2017: 105 deg; 10/28/2017: 115; 12/23/2017: 120    Time  8    Period  Weeks    Status  Achieved      PT LONG TERM GOAL #  2   Title  Patient will improve all shoulder strength 4+/5 for flexion and rotation to improve ability to manipulate objects in space.     Baseline  3/5 - 3+/5 within all measured motions; 09/14/2017: 3+/5 with all motion; 10/28/2017: 3+/5 for rotational movement and flexion improvement with extension and adduction to 4-/5; 4-/5 to 3+/5 for all shoulder movements; 02/07/2018: 4-/5 rotational movement IR/ER, 4/5 for flexion and abduction; 03/10/2018: 4/5-4+/5; 06/07/2018: 4/5- 3/5 B; 07/12/2018: 4+/5 for flexion, 4/5 for abduction on L shoulder 08/11/2018: 4+/5 for both flexion/abduction    Time  8    Period  Weeks    Status  On-going      PT LONG TERM GOAL #3   Title  Patient will  be independent with home program for strength and pain control for left UE/shoulder by 04/17/2015    Baseline  limited knowledge of pain control strategies or progression of exercises for left UE; 09/14/2017: Requires moderate cueing for exercise progression;  10/28/2017: Continues to require minimal cueing to correct; 12/23/2017: continues to require moderate cueing for exercise performance; 02/07/2018: independent with HEP    Time  8    Period  Weeks    Status  Achieved      PT LONG TERM GOAL #4   Title  Patient will have a worst pain of a 3/10 to allow for performance of household activities without increased pain    Baseline  8/10; 12/23/2017: 8/10; 02/07/2018: 7/10 Both shoulder and LBP; 03/10/2018: 6/10; 06/07/2018: 10/10; 07/12/2018: 7/10; 08/11/2018: 7/10    Time  8    Period  Weeks    Status  On-going      PT LONG TERM GOAL #5   Title  Patient will be able to stand for over 5 hours without increase in pain to allow for cooking for long periods of time wihtout increase in pain    Baseline  Increased pain after 4 hours from standing; 12/23/2017: Able to perform 5 hours of cooking with increased pain; 02/07/2018: Has not been cooking secondary to family difficulties; 03/10/2018: can cook 4 hours on cooking but has increased pain at after 4 hours; 06/07/2018: unable to test secondary to shoulder pain; 07/12/2018: reports she is able to perform with mild discomfort    Time  8    Period  Weeks    Status  Achieved      Additional Long Term Goals   Additional Long Term Goals  Yes      PT LONG TERM GOAL #6   Title  Patient will improve her MODI to under 48% to indicate significant improvement in lower back function and improvement with cooking in standing.    Baseline  58% MODI dysfunction; 03/10/2018: 52%: MODI dysfunction; 06/07/2018: will address NV; 07/11/18: 32%    Time  6    Period  Weeks    Status  Achieved      PT LONG TERM GOAL #7   Title  Patient will improve QuickDASH to under 49% to indicate significant improvement in L shoulder pain.    Baseline  59%    Time  4    Period  Weeks    Status  New            Plan - 08/17/18 1256    Clinical Impression Statement  Patient demonstrates increased ecchymosis along the medial aspect of the  elbow and medial aspect of her upper for the distal 1/3rd of her L UE. Patient demonstrates increased weakness  with pronation as well as swelling along the L UE and forearm. Patient also reports a MOI with turning a pan into pronation with her L UE, patient potentially has involvement of her pronator teres. Educated patient to follow up with MD for further assessment. Patient's L shoulder does not seem to be significantly affected, however, the swelling along the L UE has impacted an increased pain in the shoulder and limited today's session. Patient will benefit from further skilled therapy to return to prior level of function.    Personal Factors and Comorbidities  Age;Behavior Pattern;Past/Current Experience    Examination-Activity Limitations  Carry;Sleep;Lift    Examination-Participation Restrictions  Community Activity    Stability/Clinical Decision Making  Stable/Uncomplicated    Rehab Potential  Fair    Clinical Impairments Affecting Rehab Potential  (+) highly motivated, (-) age, decreased strength    PT Frequency  2x / week    PT Duration  8 weeks    PT Treatment/Interventions  Passive range of motion;Manual techniques;Electrical Stimulation;Cryotherapy;Moist Heat;Ultrasound;Therapeutic exercise;Therapeutic activities;Neuromuscular re-education;Patient/family education;Scar mobilization;Dry needling;Iontophoresis 4mg /ml Dexamethasone    PT Next Visit Plan  progress AROM exercises    PT Home Exercise Plan  See education section    Consulted and Agree with Plan of Care  Patient       Patient will benefit from skilled therapeutic intervention in order to improve the following deficits and impairments:  Pain, Impaired sensation, Decreased coordination, Decreased mobility, Increased muscle spasms, Decreased activity tolerance, Decreased endurance, Decreased range of motion, Decreased strength  Visit Diagnosis: 1. Muscle weakness (generalized)   2. Stiffness of left shoulder, not elsewhere  classified   3. Chronic left shoulder pain        Problem List There are no active problems to display for this patient.   Myrene GalasWesley Shamarion Coots, PT DPT 08/17/2018, 1:16 PM  [facility] [address] [place], [place], [address] Phone: [phone]   Fax:  [phone]  Name: Henreitta LeberDeborah L Ferrando MRN: [id number] Date of Birth: Jun 13, 1950

## 2018-08-18 ENCOUNTER — Other Ambulatory Visit: Payer: Self-pay

## 2018-08-18 ENCOUNTER — Ambulatory Visit: Payer: Medicare Other

## 2018-08-18 DIAGNOSIS — M25612 Stiffness of left shoulder, not elsewhere classified: Secondary | ICD-10-CM

## 2018-08-18 DIAGNOSIS — M6281 Muscle weakness (generalized): Secondary | ICD-10-CM | POA: Diagnosis not present

## 2018-08-18 DIAGNOSIS — M25512 Pain in left shoulder: Secondary | ICD-10-CM

## 2018-08-18 DIAGNOSIS — G8929 Other chronic pain: Secondary | ICD-10-CM

## 2018-08-18 NOTE — Therapy (Signed)
[facility] [address] [place], [place], [address] Phone: [phone]   Fax:  [phone]  Physical Therapy Treatment  Patient Details  Name: Suzanne LeberDeborah L Funes MRN: [id number] Date of Birth: May 04, 1950 Referring Provider (PT): Olga MillersKamath MD   Encounter Date: 08/18/2018  PT End of Session - 08/18/18 1521    Visit Number  49    Number of Visits  54    Date for PT Re-Evaluation  09/08/18    Authorization Type  2/10    PT Start Time  1430    PT Stop Time  1515    PT Time Calculation (min)  45 min    Activity Tolerance  Patient tolerated treatment well    Behavior During Therapy  [facility] for tasks assessed/performed       Past Medical History:  Diagnosis Date  . Anxiety   . Diabetes mellitus without complication ([facility])   . Osteoporosis     Past Surgical History:  Procedure Laterality Date  . 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 ROTATOR CUFF REPAIR Left 2014 and 2015  . SPINE SURGERY N/A 1984   L4-5, not sure of procedure    There were no vitals filed for this visit.  Subjective Assessment - 08/18/18 1441    Subjective  Patient reports she was not able to call her MD secondary to knowing what to say. Patient states her arm continues to be sore but is less swollen today versus the previous session.    Pertinent History  Chronic history of L shoulder pain, sciolosis, laminectomy     Limitations  Lifting;Sitting    Patient Stated Goals  To improve shoulder function and decrease pain    Currently in Pain?  Yes    Pain Score  6     Pain Location  Shoulder    Pain Orientation  Left    Pain Descriptors / Indicators  Aching    Pain Type  Chronic pain    Pain Onset  More than a month ago    Pain Frequency  Intermittent        TREATMENT Therapeutic Exercise  Shoulder flexion in standing - x 20  Shoulder flexion with UE ranger in standing - x 20  Shoulder horizontal abduction in standing with RTB - x 20   Elbow flexion with RTB - x 20 Shoulder extension in standing with RTB - x 20  Shoulder rows in standing - x 20 with RTB Shoulder radial glides in standing in a doorway - x20   Performed exercises to improve shoulder strength and coordination of scapular muscular activation in standing/sitting  PT Education - 08/18/18 1501    Education Details  form/technique with exercise: to follow up with MD with explanation script    Person(s) Educated  Patient    Methods  Explanation;Demonstration    Comprehension  Verbalized understanding;Returned demonstration          PT Long Term Goals - 08/11/18 1406      PT LONG TERM GOAL #1   Title  Patient will improve AROM to 120 degrees left shoulder with mild discomfort for reaching into cabinet or performing hair care     Baseline  95 AROM in sitting; 09/14/2017: 105 deg; 10/28/2017: 115; 12/23/2017: 120    Time  8    Period  Weeks    Status  Achieved      PT LONG TERM GOAL #2   Title  Patient will improve all shoulder strength 4+/5 for flexion and rotation to improve ability to manipulate objects in space.     Baseline  3/5 - 3+/5 within all measured motions; 09/14/2017: 3+/5 with all motion; 10/28/2017: 3+/5 for rotational movement and flexion improvement with extension and adduction to 4-/5; 4-/5 to 3+/5 for all shoulder movements; 02/07/2018: 4-/5 rotational movement IR/ER, 4/5 for flexion and abduction; 03/10/2018: 4/5-4+/5; 06/07/2018: 4/5- 3/5 B; 07/12/2018: 4+/5 for flexion, 4/5 for abduction on L shoulder 08/11/2018: 4+/5 for both flexion/abduction    Time  8    Period  Weeks    Status  On-going      PT LONG TERM GOAL #3   Title  Patient will  be independent with home program for strength and pain control for left UE/shoulder by 04/17/2015    Baseline  limited knowledge of pain control strategies or progression of exercises for left UE; 09/14/2017: Requires moderate cueing for exercise progression; 10/28/2017: Continues to require minimal cueing to  correct; 12/23/2017: continues to require moderate cueing for exercise performance; 02/07/2018: independent with HEP    Time  8    Period  Weeks    Status  Achieved      PT LONG TERM GOAL #4   Title  Patient will have a worst pain of a 3/10 to allow for performance of household activities without increased pain    Baseline  8/10; 12/23/2017: 8/10; 02/07/2018: 7/10 Both shoulder and LBP; 03/10/2018: 6/10; 06/07/2018: 10/10; 07/12/2018: 7/10; 08/11/2018: 7/10    Time  8    Period  Weeks    Status  On-going      PT LONG TERM GOAL #5   Title  Patient will be able to stand for over 5 hours without increase in pain to allow for cooking for long periods of time wihtout increase in pain    Baseline  Increased pain after 4 hours from standing; 12/23/2017: Able to perform 5 hours of cooking with increased pain; 02/07/2018: Has not been cooking secondary to family difficulties; 03/10/2018: can cook 4 hours on cooking but has increased pain at after 4 hours; 06/07/2018: unable to test secondary to shoulder pain; 07/12/2018: reports she is able to perform with mild discomfort    Time  8    Period  Weeks    Status  Achieved      Additional Long Term Goals   Additional Long Term Goals  Yes      PT LONG TERM GOAL #6   Title  Patient will improve her MODI to under 48% to indicate significant improvement in lower back function and improvement with cooking in standing.    Baseline  58% MODI dysfunction; 03/10/2018: 52%: MODI dysfunction; 06/07/2018: will address NV; 07/11/18: 32%    Time  6    Period  Weeks    Status  Achieved      PT LONG TERM GOAL #7   Title  Patient will improve QuickDASH to under 49% to indicate significant improvement in L shoulder pain.    Baseline  59%    Time  4    Period  Weeks    Status  New            Plan - 08/18/18 1522    Clinical Impression Statement  Patient demonstrates difficulty with performing shoulder flexion today, however this is improved compared to the previous  sessions. Patient demonstrates increased symptoms along radial nerve distribution. Patient demonstrates improvement at the end of the session  with ability to perform greater amount of flexion compared to the beginning of the session. Patient will benefit from further skilled therapy to return to prior level of function.    Personal Factors and Comorbidities  Age;Behavior Pattern;Past/Current Experience    Examination-Activity Limitations  Carry;Sleep;Lift    Examination-Participation Restrictions  Community Activity    Stability/Clinical Decision Making  Stable/Uncomplicated    Rehab Potential  Fair    Clinical Impairments Affecting Rehab Potential  (+) highly motivated, (-) age, decreased strength    PT Frequency  2x / week    PT Duration  8 weeks    PT Treatment/Interventions  Passive range of motion;Manual techniques;Electrical Stimulation;Cryotherapy;Moist Heat;Ultrasound;Therapeutic exercise;Therapeutic activities;Neuromuscular re-education;Patient/family education;Scar mobilization;Dry needling;Iontophoresis 4mg /ml Dexamethasone    PT Next Visit Plan  progress AROM exercises    PT Home Exercise Plan  See education section    Consulted and Agree with Plan of Care  Patient       Patient will benefit from skilled therapeutic intervention in order to improve the following deficits and impairments:  Pain, Impaired sensation, Decreased coordination, Decreased mobility, Increased muscle spasms, Decreased activity tolerance, Decreased endurance, Decreased range of motion, Decreased strength  Visit Diagnosis: 1. Muscle weakness (generalized)   2. Stiffness of left shoulder, not elsewhere classified   3. Chronic left shoulder pain        Problem List There are no active problems to display for this patient.   Blythe Stanford, PT DPT 08/18/2018, 3:51 PM  [facility] [address], [place], [address] Phone: [phone]    Fax:  [phone]  Name: KAILENE STEINHART MRN: [id number] Date of Birth: [birth year]

## 2018-08-23 ENCOUNTER — Other Ambulatory Visit: Payer: Self-pay

## 2018-08-23 ENCOUNTER — Ambulatory Visit: Payer: Medicare Other

## 2018-08-23 DIAGNOSIS — M6281 Muscle weakness (generalized): Secondary | ICD-10-CM | POA: Diagnosis not present

## 2018-08-23 DIAGNOSIS — M25512 Pain in left shoulder: Secondary | ICD-10-CM

## 2018-08-23 DIAGNOSIS — G8929 Other chronic pain: Secondary | ICD-10-CM

## 2018-08-23 DIAGNOSIS — M25612 Stiffness of left shoulder, not elsewhere classified: Secondary | ICD-10-CM

## 2018-08-24 NOTE — Therapy (Signed)
[place] [facility] [address] [place], [place], [address] Phone: [phone]   Fax:  [phone]  Physical Therapy Treatment  Patient Details  Name: Suzanne LeberDeborah L Lehnen MRN: [id number] Date of Birth: 04/22/1950 Referring Provider (PT): Olga MillersKamath MD   Encounter Date: 08/23/2018  PT End of Session - 08/23/18 1411    Visit Number  50    Number of Visits  54    Date for PT Re-Evaluation  09/08/18    Authorization Type  3/10    PT Start Time  1345    PT Stop Time  1430    PT Time Calculation (min)  45 min    Activity Tolerance  Patient tolerated treatment well    Behavior During Therapy  [facility] for tasks assessed/performed       Past Medical History:  Diagnosis Date  . Anxiety   . Diabetes mellitus without complication (HCC)   . Osteoporosis     Past Surgical History:  Procedure Laterality Date  . 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 ROTATOR CUFF REPAIR Left 2014 and 2015  . SPINE SURGERY N/A 1984   L4-5, not sure of procedure    There were no vitals filed for this visit.  Subjective Assessment - 08/23/18 1402    Subjective  Patient reports she continues to have difficulty with performing pouring apples into a dish and would like to address this limitation. Patient states her bruising is improving slowly and still continues to have some bruising into the shoulder.    Pertinent History  Chronic history of L shoulder pain, sciolosis, laminectomy     Limitations  Lifting;Sitting    Patient Stated Goals  To improve shoulder function and decrease pain    Currently in Pain?  Yes    Pain Score  6     Pain Location  Shoulder    Pain Orientation  Left    Pain Descriptors / Indicators  Aching    Pain Type  Chronic pain    Pain Onset  More than a month ago    Pain Frequency  Intermittent        TREATMENT Therapeutic Exercise  Performed turning over a large pot in standing utilizing different hand holding techniques  to perform with greater ease at home with the L shoulder - x 10  Shoulder rows in standing - x 10 with 10# Shoulder flexion with UE ranger in standing - x 20  Shoulder pulleys in sitting - x 20 Shoulder push up from the wall - x 20  Shoulder Adduction with GTB overhead - x20 Shoulder extension in standing with RTB - x 20  Shoulder radial glides in standing in a doorway - x20 Pronation and supination - x 20 with a large wrench   Performed exercises to improve shoulder strength and coordination of scapular muscular activation in standing/sitting  PT Education - 08/23/18 1409    Education Details  form/technique with exercise;    Person(s) Educated  Patient    Methods  Explanation;Demonstration    Comprehension  Verbalized understanding;Returned demonstration          PT Long Term Goals - 08/11/18 1406      PT LONG TERM GOAL #1   Title  Patient will improve AROM to 120 degrees left shoulder with mild discomfort for reaching into cabinet or performing hair care     Baseline  95 AROM in sitting; 09/14/2017: 105 deg; 10/28/2017: 115; 12/23/2017:  120    Time  8    Period  Weeks    Status  Achieved      PT LONG TERM GOAL #2   Title  Patient will improve all shoulder strength 4+/5 for flexion and rotation to improve ability to manipulate objects in space.     Baseline  3/5 - 3+/5 within all measured motions; 09/14/2017: 3+/5 with all motion; 10/28/2017: 3+/5 for rotational movement and flexion improvement with extension and adduction to 4-/5; 4-/5 to 3+/5 for all shoulder movements; 02/07/2018: 4-/5 rotational movement IR/ER, 4/5 for flexion and abduction; 03/10/2018: 4/5-4+/5; 06/07/2018: 4/5- 3/5 B; 07/12/2018: 4+/5 for flexion, 4/5 for abduction on L shoulder 08/11/2018: 4+/5 for both flexion/abduction    Time  8    Period  Weeks    Status  On-going      PT LONG TERM GOAL #3   Title  Patient will  be independent with home program for strength and pain control for left UE/shoulder by  04/17/2015    Baseline  limited knowledge of pain control strategies or progression of exercises for left UE; 09/14/2017: Requires moderate cueing for exercise progression; 10/28/2017: Continues to require minimal cueing to correct; 12/23/2017: continues to require moderate cueing for exercise performance; 02/07/2018: independent with HEP    Time  8    Period  Weeks    Status  Achieved      PT LONG TERM GOAL #4   Title  Patient will have a worst pain of a 3/10 to allow for performance of household activities without increased pain    Baseline  8/10; 12/23/2017: 8/10; 02/07/2018: 7/10 Both shoulder and LBP; 03/10/2018: 6/10; 06/07/2018: 10/10; 07/12/2018: 7/10; 08/11/2018: 7/10    Time  8    Period  Weeks    Status  On-going      PT LONG TERM GOAL #5   Title  Patient will be able to stand for over 5 hours without increase in pain to allow for cooking for long periods of time wihtout increase in pain    Baseline  Increased pain after 4 hours from standing; 12/23/2017: Able to perform 5 hours of cooking with increased pain; 02/07/2018: Has not been cooking secondary to family difficulties; 03/10/2018: can cook 4 hours on cooking but has increased pain at after 4 hours; 06/07/2018: unable to test secondary to shoulder pain; 07/12/2018: reports she is able to perform with mild discomfort    Time  8    Period  Weeks    Status  Achieved      Additional Long Term Goals   Additional Long Term Goals  Yes      PT LONG TERM GOAL #6   Title  Patient will improve her MODI to under 48% to indicate significant improvement in lower back function and improvement with cooking in standing.    Baseline  58% MODI dysfunction; 03/10/2018: 52%: MODI dysfunction; 06/07/2018: will address NV; 07/11/18: 32%    Time  6    Period  Weeks    Status  Achieved      PT LONG TERM GOAL #7   Title  Patient will improve QuickDASH to under 49% to indicate significant improvement in L shoulder pain.    Baseline  59%    Time  4    Period   Weeks    Status  New            Plan - 08/24/18 0921    Clinical Impression Statement  Assessed  patient's ability to perform functional turning of a pot in standing from supination to pronation to assess UE functional strength. Pot weighed ~ 6 pounds. Patient unable to perform unilaterally with either UE and required both to perform. Focused on utilizing techniques to improve functional strength with this motion. Patient continues to have difficulty with performing overhead flexion and fatigues quickly with this motion. Patient will benefit from further skilled therapy to return to prior level of function.    Personal Factors and Comorbidities  Age;Behavior Pattern;Past/Current Experience    Examination-Activity Limitations  Carry;Sleep;Lift    Examination-Participation Restrictions  Community Activity    Stability/Clinical Decision Making  Stable/Uncomplicated    Rehab Potential  Fair    Clinical Impairments Affecting Rehab Potential  (+) highly motivated, (-) age, decreased strength    PT Frequency  2x / week    PT Duration  8 weeks    PT Treatment/Interventions  Passive range of motion;Manual techniques;Electrical Stimulation;Cryotherapy;Moist Heat;Ultrasound;Therapeutic exercise;Therapeutic activities;Neuromuscular re-education;Patient/family education;Scar mobilization;Dry needling;Iontophoresis 4mg /ml Dexamethasone    PT Next Visit Plan  progress AROM exercises    PT Home Exercise Plan  See education section    Consulted and Agree with Plan of Care  Patient       Patient will benefit from skilled therapeutic intervention in order to improve the following deficits and impairments:  Pain, Impaired sensation, Decreased coordination, Decreased mobility, Increased muscle spasms, Decreased activity tolerance, Decreased endurance, Decreased range of motion, Decreased strength  Visit Diagnosis: 1. Muscle weakness (generalized)   2. Stiffness of left shoulder, not elsewhere classified    3. Chronic left shoulder pain        Problem List There are no active problems to display for this patient.   Blythe Stanford, PT DPT 08/24/2018, 9:40 AM  [place] [facility] [address], [place], [address] Phone: [phone]   Fax:  [phone]  Name: KATERYN MARASIGAN MRN: [id number] Date of Birth: 08-28-1950

## 2018-08-25 ENCOUNTER — Ambulatory Visit: Payer: Medicare Other

## 2018-08-30 ENCOUNTER — Other Ambulatory Visit: Payer: Self-pay

## 2018-08-30 ENCOUNTER — Ambulatory Visit: Payer: Medicare Other

## 2018-08-30 DIAGNOSIS — M25512 Pain in left shoulder: Secondary | ICD-10-CM

## 2018-08-30 DIAGNOSIS — G8929 Other chronic pain: Secondary | ICD-10-CM

## 2018-08-30 DIAGNOSIS — M25612 Stiffness of left shoulder, not elsewhere classified: Secondary | ICD-10-CM

## 2018-08-30 DIAGNOSIS — M6281 Muscle weakness (generalized): Secondary | ICD-10-CM | POA: Diagnosis not present

## 2018-08-30 NOTE — Therapy (Signed)
[place] [facility] [address] [place], [address] Phone: [phone]   Fax:  [phone]  Physical Therapy Treatment  Patient Details  Name: Suzanne Snyder MRN: [id number] Date of Birth: March 18, 1950 Referring Provider (PT): Kathyrn Sheriff MD   Encounter Date: 08/30/2018  PT End of Session - 08/30/18 1513    Visit Number  51    Number of Visits  54    Date for PT Re-Evaluation  09/08/18    Authorization Type  4/10    PT Start Time  1941    PT Stop Time  1430    PT Time Calculation (min)  45 min    Activity Tolerance  Patient tolerated treatment well    Behavior During Therapy  [facility] for tasks assessed/performed       Past Medical History:  Diagnosis Date  . Anxiety   . Diabetes mellitus without complication ([place])   . Osteoporosis     Past Surgical History:  Procedure Laterality Date  . 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 ROTATOR CUFF REPAIR Left 2014 and 2015  . SPINE SURGERY N/A 1984   L4-5, not sure of procedure    There were no vitals filed for this visit.  Subjective Assessment - 08/30/18 1510    Subjective  Pt reports she has always been "very active" with her shoulders, including a history of competitive diving and horseback riding.  She rates her L shoulder pain as a "7"/10 today upon arrival.    Pertinent History  Chronic history of L shoulder pain, sciolosis, laminectomy     Limitations  Lifting;Sitting    Patient Stated Goals  To improve shoulder function and decrease pain    Currently in Pain?  Yes    Pain Score  7     Pain Location  Shoulder    Pain Orientation  Left    Pain Descriptors / Indicators  Aching    Pain Type  Chronic pain    Pain Onset  More than a month ago    Pain Frequency  Intermittent          PT Treatment:  Manual therapy:  PROM to L shoulder with gentle gr. 2 inf glides and oscillation; manual rhythmic stabs with L shoulder at 45 degrees and 90 degrees.   Petrissage and TPR to L ant and lat deltoid.  Therapeutic Exercise:  SL'ing L shoulder ER against gravity 2x10; stdg shldr extension with RTB 20x; stdg shldr punch out with YTB 2x10; stdg rows with GTB B 20x; stdg bicep curls with RTB 2x10 B.                     PT Education - 08/30/18 1512    Education Details  Added to HEP today: sidelying ER on L against gravity; shoulder "punch out" with YTB in standing    Person(s) Educated  Patient    Methods  Explanation;Verbal cues    Comprehension  Verbalized understanding;Returned demonstration          PT Long Term Goals - 08/11/18 1406      PT LONG TERM GOAL #1   Title  Patient will improve AROM to 120 degrees left shoulder with mild discomfort for reaching into cabinet or performing hair care     Baseline  95 AROM in sitting; 09/14/2017: 105 deg; 10/28/2017: 115; 12/23/2017: 120    Time  8    Period  Weeks  Status  Achieved      PT LONG TERM GOAL #2   Title  Patient will improve all shoulder strength 4+/5 for flexion and rotation to improve ability to manipulate objects in space.     Baseline  3/5 - 3+/5 within all measured motions; 09/14/2017: 3+/5 with all motion; 10/28/2017: 3+/5 for rotational movement and flexion improvement with extension and adduction to 4-/5; 4-/5 to 3+/5 for all shoulder movements; 02/07/2018: 4-/5 rotational movement IR/ER, 4/5 for flexion and abduction; 03/10/2018: 4/5-4+/5; 06/07/2018: 4/5- 3/5 B; 07/12/2018: 4+/5 for flexion, 4/5 for abduction on L shoulder 08/11/2018: 4+/5 for both flexion/abduction    Time  8    Period  Weeks    Status  On-going      PT LONG TERM GOAL #3   Title  Patient will  be independent with home program for strength and pain control for left UE/shoulder by 04/17/2015    Baseline  limited knowledge of pain control strategies or progression of exercises for left UE; 09/14/2017: Requires moderate cueing for exercise progression; 10/28/2017: Continues to require minimal cueing to  correct; 12/23/2017: continues to require moderate cueing for exercise performance; 02/07/2018: independent with HEP    Time  8    Period  Weeks    Status  Achieved      PT LONG TERM GOAL #4   Title  Patient will have a worst pain of a 3/10 to allow for performance of household activities without increased pain    Baseline  8/10; 12/23/2017: 8/10; 02/07/2018: 7/10 Both shoulder and LBP; 03/10/2018: 6/10; 06/07/2018: 10/10; 07/12/2018: 7/10; 08/11/2018: 7/10    Time  8    Period  Weeks    Status  On-going      PT LONG TERM GOAL #5   Title  Patient will be able to stand for over 5 hours without increase in pain to allow for cooking for long periods of time wihtout increase in pain    Baseline  Increased pain after 4 hours from standing; 12/23/2017: Able to perform 5 hours of cooking with increased pain; 02/07/2018: Has not been cooking secondary to family difficulties; 03/10/2018: can cook 4 hours on cooking but has increased pain at after 4 hours; 06/07/2018: unable to test secondary to shoulder pain; 07/12/2018: reports she is able to perform with mild discomfort    Time  8    Period  Weeks    Status  Achieved      Additional Long Term Goals   Additional Long Term Goals  Yes      PT LONG TERM GOAL #6   Title  Patient will improve her MODI to under 48% to indicate significant improvement in lower back function and improvement with cooking in standing.    Baseline  58% MODI dysfunction; 03/10/2018: 52%: MODI dysfunction; 06/07/2018: will address NV; 07/11/18: 32%    Time  6    Period  Weeks    Status  Achieved      PT LONG TERM GOAL #7   Title  Patient will improve QuickDASH to under 49% to indicate significant improvement in L shoulder pain.    Baseline  59%    Time  4    Period  Weeks    Status  New            Plan - 08/30/18 1513    Clinical Impression Statement  Pt exhibited weakness with ER activities and shoulder punch--so added these to home exercises. She repsonded well to  manual  rhythmic stabs in supine for scapular stabilization today.    Personal Factors and Comorbidities  Age;Behavior Pattern;Past/Current Experience    Examination-Activity Limitations  Carry;Sleep;Lift    Examination-Participation Restrictions  Community Activity    Stability/Clinical Decision Making  Stable/Uncomplicated    Clinical Decision Making  Low    Rehab Potential  Fair    Clinical Impairments Affecting Rehab Potential  (+) highly motivated, (-) age, decreased strength    PT Frequency  2x / week    PT Duration  8 weeks    PT Treatment/Interventions  Passive range of motion;Manual techniques;Electrical Stimulation;Cryotherapy;Moist Heat;Ultrasound;Therapeutic exercise;Therapeutic activities;Neuromuscular re-education;Patient/family education;Scar mobilization;Dry needling;Iontophoresis 4mg /ml Dexameth asone    PT Next Visit Plan  progress AROM exercises    PT Home Exercise Plan  See education section    Consulted and Agree with Plan of Care  Patient       Patient will benefit from skilled therapeutic intervention in order to improve the following deficits and impairments:  Pain, Impaired sensation, Decreased coordination, Decreased mobility, Increased muscle spasms, Decreased activity tolerance, Decreased endurance, Decreased range of motion, Decreased strength  Visit Diagnosis: 1. Muscle weakness (generalized)   2. Stiffness of left shoulder, not elsewhere classified   3. Chronic left shoulder pain        Problem List There are no active problems to display for this patient.   Pervis Macintyre, MPT 08/30/2018, 3:17 PM  [place] [facility] [address] [place], [place], [address] Phone: [phone]   Fax:  [phone]  Name: Henreitta LeberDeborah L Urbieta MRN: [id number] Date of Birth: 07-01-1950

## 2018-09-05 ENCOUNTER — Other Ambulatory Visit: Payer: Self-pay

## 2018-09-05 ENCOUNTER — Ambulatory Visit: Payer: Medicare Other

## 2018-09-05 DIAGNOSIS — M6281 Muscle weakness (generalized): Secondary | ICD-10-CM | POA: Diagnosis not present

## 2018-09-05 DIAGNOSIS — G8929 Other chronic pain: Secondary | ICD-10-CM

## 2018-09-05 DIAGNOSIS — M25612 Stiffness of left shoulder, not elsewhere classified: Secondary | ICD-10-CM

## 2018-09-05 NOTE — Therapy (Signed)
[place] [facility] [address] [place], [place], [address] Phone: [phone]   Fax:  [phone]  Physical Therapy Treatment  Patient Details  Name: Suzanne Snyder MRN: [id number] Date of Birth: Aug 04, 1950 Referring Provider (PT): Olga MillersKamath MD   Encounter Date: 09/05/2018  PT End of Session - 09/05/18 1609    Visit Number  52    Number of Visits  54    Date for PT Re-Evaluation  09/08/18    Authorization Type  5/10    PT Start Time  1600    PT Stop Time  1645    PT Time Calculation (min)  45 min    Activity Tolerance  Patient tolerated treatment well    Behavior During Therapy  [facility] tasks assessed/per[facility]       Past Medical History:  Diagnosis Date  . Anxiety   . Diabetes mellitus without complication (HCC)   . Osteoporosis     Past Surgical History:  Procedure Laterality Date  . 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 ROTATOR CUFF REPAIR Left 2014 and 2015  . SPINE SURGERY N/A 1984   L4-5, not sure of procedure    There were no vitals filed for this visit.  Subjective Assessment - 09/05/18 1522    Subjective  Patient states her shoulder has been feeling weak recently. Patient reports difficulty with performing shoulder raises in standing and states she continues to have weakness with performing overhead shoulder flexion.    Pertinent History  Chronic history of L shoulder pain, sciolosis, laminectomy     Limitations  Lifting;Sitting    Patient Stated Goals  To improve shoulder function and decrease pain    Currently in Pain?  Yes    Pain Score  7     Pain Location  Shoulder    Pain Orientation  Left    Pain Descriptors / Indicators  Aching    Pain Type  Chronic pain    Pain Onset  More than a month ago    Pain Frequency  Intermittent       TREATMENT Therapeutic Exercise  B Shoulder ER with RTB - x 15 Shoulder rows in standing - x 10 with 10# Shoulder flexion in standing - x 20   Shoulder pulleys in sitting - x 20 Shoulder extension in standing - x 20 with RTB Shoulder push up from the wall - x 20  Ball throw in standing - x 20   Performed exercises to improve shoulder strength and coordination of scapular muscular activation in standing/sitting   PT Education - 09/05/18 1607    Education Details  Newman PiesBall tosses with focus on improving speed for dynamic control    Person(s) Educated  Patient    Methods  Explanation;Demonstration    Comprehension  Verbalized understanding;Returned demonstration          PT Long Term Goals - 08/11/18 1406      PT LONG TERM GOAL #1   Title  Patient will improve AROM to 120 degrees left shoulder with mild discomfort for reaching into cabinet or performing hair care     Baseline  95 AROM in sitting; 09/14/2017: 105 deg; 10/28/2017: 115; 12/23/2017: 120    Time  8    Period  Weeks    Status  Achieved      PT LONG TERM GOAL #2   Title  Patient will improve all shoulder strength 4+/5 for flexion and rotation to  improve ability to manipulate objects in space.     Baseline  3/5 - 3+/5 within all measured motions; 09/14/2017: 3+/5 with all motion; 10/28/2017: 3+/5 for rotational movement and flexion improvement with extension and adduction to 4-/5; 4-/5 to 3+/5 for all shoulder movements; 02/07/2018: 4-/5 rotational movement IR/ER, 4/5 for flexion and abduction; 03/10/2018: 4/5-4+/5; 06/07/2018: 4/5- 3/5 B; 07/12/2018: 4+/5 for flexion, 4/5 for abduction on L shoulder 08/11/2018: 4+/5 for both flexion/abduction    Time  8    Period  Weeks    Status  On-going      PT LONG TERM GOAL #3   Title  Patient will  be independent with home program for strength and pain control for left UE/shoulder by 04/17/2015    Baseline  limited knowledge of pain control strategies or progression of exercises for left UE; 09/14/2017: Requires moderate cueing for exercise progression; 10/28/2017: Continues to require minimal cueing to correct; 12/23/2017: continues to  require moderate cueing for exercise performance; 02/07/2018: independent with HEP    Time  8    Period  Weeks    Status  Achieved      PT LONG TERM GOAL #4   Title  Patient will have a worst pain of a 3/10 to allow for performance of household activities without increased pain    Baseline  8/10; 12/23/2017: 8/10; 02/07/2018: 7/10 Both shoulder and LBP; 03/10/2018: 6/10; 06/07/2018: 10/10; 07/12/2018: 7/10; 08/11/2018: 7/10    Time  8    Period  Weeks    Status  On-going      PT LONG TERM GOAL #5   Title  Patient will be able to stand for over 5 hours without increase in pain to allow for cooking for long periods of time wihtout increase in pain    Baseline  Increased pain after 4 hours from standing; 12/23/2017: Able to perform 5 hours of cooking with increased pain; 02/07/2018: Has not been cooking secondary to family difficulties; 03/10/2018: can cook 4 hours on cooking but has increased pain at after 4 hours; 06/07/2018: unable to test secondary to shoulder pain; 07/12/2018: reports she is able to perform with mild discomfort    Time  8    Period  Weeks    Status  Achieved      Additional Long Term Goals   Additional Long Term Goals  Yes      PT LONG TERM GOAL #6   Title  Patient will improve her MODI to under 48% to indicate significant improvement in lower back function and improvement with cooking in standing.    Baseline  58% MODI dysfunction; 03/10/2018: 52%: MODI dysfunction; 06/07/2018: will address NV; 07/11/18: 32%    Time  6    Period  Weeks    Status  Achieved      PT LONG TERM GOAL #7   Title  Patient will improve QuickDASH to under 49% to indicate significant improvement in L shoulder pain.    Baseline  59%    Time  4    Period  Weeks    Status  New            Plan - 09/05/18 1622    Clinical Impression Statement  Patient demonstrates decreased muscular strength with performing shoulder ER with is in line with the surgery and can't go throughout full AROM against  resistance. Continued to focus on this and improving overhead strength however, patient continues to demonstrate weakness with this motion. Patient will benefit from  further skilled therapy to return to prior level of function.    Personal Factors and Comorbidities  Age;Behavior Pattern;Past/Current Experience    Examination-Activity Limitations  Carry;Sleep;Lift    Examination-Participation Restrictions  Community Activity    Stability/Clinical Decision Making  Stable/Uncomplicated    Rehab Potential  Fair    Clinical Impairments Affecting Rehab Potential  (+) highly motivated, (-) age, decreased strength    PT Frequency  2x / week    PT Duration  8 weeks    PT Treatment/Interventions  Passive range of motion;Manual techniques;Electrical Stimulation;Cryotherapy;Moist Heat;Ultrasound;Therapeutic exercise;Therapeutic activities;Neuromuscular re-education;Patient/family education;Scar mobilization;Dry needling;Iontophoresis 4mg /ml Dexamethasone    PT Next Visit Plan  progress AROM exercises    PT Home Exercise Plan  See education section    Consulted and Agree with Plan of Care  Patient       Patient will benefit from skilled therapeutic intervention in order to improve the following deficits and impairments:  Pain, Impaired sensation, Decreased coordination, Decreased mobility, Increased muscle spasms, Decreased activity tolerance, Decreased endurance, Decreased range of motion, Decreased strength  Visit Diagnosis: Muscle weakness (generalized)  Stiffness of left shoulder, not elsewhere classified  Chronic left shoulder pain     Problem List There are no active problems to display for this patient.   Myrene GalasWesley Kadrian Partch, PT DPT 09/05/2018, 4:29 PM  [place] [facility] [address] [place], [place], [address] Phone: [phone]   Fax:  [phone]  Name: Suzanne Snyder MRN: [id number] Date of Birth: June 29, 1950

## 2018-09-06 ENCOUNTER — Ambulatory Visit: Payer: Medicare Other

## 2018-09-08 ENCOUNTER — Other Ambulatory Visit: Payer: Self-pay

## 2018-09-08 ENCOUNTER — Ambulatory Visit: Payer: Medicare Other

## 2018-09-08 DIAGNOSIS — M6281 Muscle weakness (generalized): Secondary | ICD-10-CM | POA: Diagnosis not present

## 2018-09-08 DIAGNOSIS — M25612 Stiffness of left shoulder, not elsewhere classified: Secondary | ICD-10-CM

## 2018-09-08 NOTE — Therapy (Signed)
[place] [facility] [address], [place], [address] Phone: [phone]   Fax:  [phone]  Physical Therapy Treatment  Patient Details  Name: Suzanne Snyder MRN: [id number] Date of Birth: 19-Mar-1950 Referring Provider (PT): Kathyrn Sheriff MD   Encounter Date: 09/08/2018  PT End of Session - 09/08/18 1315    Visit Number  38    Number of Visits  54    Date for PT Re-Evaluation  09/08/18    Authorization Type  6/10    PT Start Time  1300    PT Stop Time  1345    PT Time Calculation (min)  45 min    Activity Tolerance  Patient tolerated treatment well    Behavior During Therapy  [facility] for tasks assessed/performed       Past Medical History:  Diagnosis Date  . Anxiety   . Diabetes mellitus without complication ([place])   . Osteoporosis     Past Surgical History:  Procedure Laterality Date  . 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 ROTATOR CUFF REPAIR Left 2014 and 2015  . SPINE SURGERY N/A 1984   L4-5, not sure of procedure    There were no vitals filed for this visit.  Subjective Assessment - 09/08/18 1308    Subjective  Patient reports in shoulder pain after receiving a NCV study the previous day. Patient reports she is feels stressed about family limitations.    Pertinent History  Chronic history of L shoulder pain, sciolosis, laminectomy     Limitations  Lifting;Sitting    Patient Stated Goals  To improve shoulder function and decrease pain    Currently in Pain?  Yes    Pain Score  6     Pain Location  Shoulder    Pain Orientation  Left    Pain Descriptors / Indicators  Aching    Pain Type  Chronic pain    Pain Onset  More than a month ago    Pain Frequency  Intermittent         TREATMENT Therapeutic Exercise  B Shoulder ER with YTB - x 20 Shoulder rows in standing - x 10 with BTB Shoulder flexion in standing - x 20 with use of ball Shoulder flexion with UE ranger - x 20  Shoulder pulleys in  sitting - x 20 Shoulder extension in standing - x 20 with RTB Ball throw in standing - x 20 Shoulder push up from the wall - x 20     Performed exercises to improve shoulder strength and coordination of scapular muscular activation in standing/sitting   PT Education - 09/08/18 1315    Education Details  form/technique with exercise    Person(s) Educated  Patient    Methods  Explanation;Demonstration    Comprehension  Verbalized understanding;Returned demonstration          PT Long Term Goals - 08/11/18 1406      PT LONG TERM GOAL #1   Title  Patient will improve AROM to 120 degrees left shoulder with mild discomfort for reaching into cabinet or performing hair care     Baseline  95 AROM in sitting; 09/14/2017: 105 deg; 10/28/2017: 115; 12/23/2017: 120    Time  8    Period  Weeks    Status  Achieved      PT LONG TERM GOAL #2   Title  Patient will improve all shoulder strength 4+/5 for flexion and rotation to  improve ability to manipulate objects in space.     Baseline  3/5 - 3+/5 within all measured motions; 09/14/2017: 3+/5 with all motion; 10/28/2017: 3+/5 for rotational movement and flexion improvement with extension and adduction to 4-/5; 4-/5 to 3+/5 for all shoulder movements; 02/07/2018: 4-/5 rotational movement IR/ER, 4/5 for flexion and abduction; 03/10/2018: 4/5-4+/5; 06/07/2018: 4/5- 3/5 B; 07/12/2018: 4+/5 for flexion, 4/5 for abduction on L shoulder 08/11/2018: 4+/5 for both flexion/abduction    Time  8    Period  Weeks    Status  On-going      PT LONG TERM GOAL #3   Title  Patient will  be independent with home program for strength and pain control for left UE/shoulder by 04/17/2015    Baseline  limited knowledge of pain control strategies or progression of exercises for left UE; 09/14/2017: Requires moderate cueing for exercise progression; 10/28/2017: Continues to require minimal cueing to correct; 12/23/2017: continues to require moderate cueing for exercise performance;  02/07/2018: independent with HEP    Time  8    Period  Weeks    Status  Achieved      PT LONG TERM GOAL #4   Title  Patient will have a worst pain of a 3/10 to allow for performance of household activities without increased pain    Baseline  8/10; 12/23/2017: 8/10; 02/07/2018: 7/10 Both shoulder and LBP; 03/10/2018: 6/10; 06/07/2018: 10/10; 07/12/2018: 7/10; 08/11/2018: 7/10    Time  8    Period  Weeks    Status  On-going      PT LONG TERM GOAL #5   Title  Patient will be able to stand for over 5 hours without increase in pain to allow for cooking for long periods of time wihtout increase in pain    Baseline  Increased pain after 4 hours from standing; 12/23/2017: Able to perform 5 hours of cooking with increased pain; 02/07/2018: Has not been cooking secondary to family difficulties; 03/10/2018: can cook 4 hours on cooking but has increased pain at after 4 hours; 06/07/2018: unable to test secondary to shoulder pain; 07/12/2018: reports she is able to perform with mild discomfort    Time  8    Period  Weeks    Status  Achieved      Additional Long Term Goals   Additional Long Term Goals  Yes      PT LONG TERM GOAL #6   Title  Patient will improve her MODI to under 48% to indicate significant improvement in lower back function and improvement with cooking in standing.    Baseline  58% MODI dysfunction; 03/10/2018: 52%: MODI dysfunction; 06/07/2018: will address NV; 07/11/18: 32%    Time  6    Period  Weeks    Status  Achieved      PT LONG TERM GOAL #7   Title  Patient will improve QuickDASH to under 49% to indicate significant improvement in L shoulder pain.    Baseline  59%    Time  4    Period  Weeks    Status  New            Plan - 09/08/18 1345    Clinical Impression Statement  Patient demonstrates improvement with ability to perform shoulder flexion in sitting and standing. Patient demonstrates difficulty with shoulder flexion most notably with flexion and ER and continued to  address these limitations. Patient will benefit from further skilled therapy to return to prior level of function.  Personal Factors and Comorbidities  Age;Behavior Pattern;Past/Current Experience    Examination-Activity Limitations  Carry;Sleep;Lift    Examination-Participation Restrictions  Community Activity    Stability/Clinical Decision Making  Stable/Uncomplicated    Rehab Potential  Fair    Clinical Impairments Affecting Rehab Potential  (+) highly motivated, (-) age, decreased strength    PT Frequency  2x / week    PT Duration  8 weeks    PT Treatment/Interventions  Passive range of motion;Manual techniques;Electrical Stimulation;Cryotherapy;Moist Heat;Ultrasound;Therapeutic exercise;Therapeutic activities;Neuromuscular re-education;Patient/family education;Scar mobilization;Dry needling;Iontophoresis 4mg /ml Dexamethasone    PT Next Visit Plan  progress AROM exercises    PT Home Exercise Plan  See education section    Consulted and Agree with Plan of Care  Patient       Patient will benefit from skilled therapeutic intervention in order to improve the following deficits and impairments:  Pain, Impaired sensation, Decreased coordination, Decreased mobility, Increased muscle spasms, Decreased activity tolerance, Decreased endurance, Decreased range of motion, Decreased strength  Visit Diagnosis: Muscle weakness (generalized)  Stiffness of left shoulder, not elsewhere classified     Problem List There are no active problems to display for this patient.   [name], PT DPT 09/08/2018, 1:59 PM  [facility] [address], [place], [address] Phone: [phone]   Fax:  [phone]  Name: Suzanne Snyder MRN: [id number] Date of Birth: 01-09-1951

## 2018-09-12 ENCOUNTER — Ambulatory Visit: Payer: Medicare Other

## 2018-09-12 ENCOUNTER — Other Ambulatory Visit: Payer: Self-pay

## 2018-09-12 DIAGNOSIS — M6281 Muscle weakness (generalized): Secondary | ICD-10-CM

## 2018-09-12 DIAGNOSIS — M25612 Stiffness of left shoulder, not elsewhere classified: Secondary | ICD-10-CM

## 2018-09-12 DIAGNOSIS — G8929 Other chronic pain: Secondary | ICD-10-CM

## 2018-09-12 DIAGNOSIS — M25512 Pain in left shoulder: Secondary | ICD-10-CM

## 2018-09-12 NOTE — Therapy (Signed)
[place] [facility] [address], [place], [address] Phone: [phone]   Fax:  [phone]  Physical Therapy Treatment  Patient Details  Name: Suzanne Snyder MRN: [id number] Date of Birth: [birth year] Referring Provider (PT): Kathyrn Sheriff MD   Encounter Date: 09/12/2018  PT End of Session - 09/12/18 1441    Visit Number  54    Number of Visits  54    Date for PT Re-Evaluation  09/08/18    Authorization Type  7/10    PT Start Time  1430    PT Stop Time  1515    PT Time Calculation (min)  45 min    Activity Tolerance  Patient tolerated treatment well    Behavior During Therapy  [facility] for tasks assessed/performed       Past Medical History:  Diagnosis Date  . Anxiety   . Diabetes mellitus without complication ([place])   . Osteoporosis     Past Surgical History:  Procedure Laterality Date  . 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 ROTATOR CUFF REPAIR Left 2014 and 2015  . SPINE SURGERY N/A 1984   L4-5, not sure of procedure    There were no vitals filed for this visit.  Subjective Assessment - 09/12/18 1436    Subjective  Patient reports her L shoulder was feeling 'okay' today until she had to untangle a sheet which aggravate her pain. Patient reports no significant changes since the previous session.    Pertinent History  Chronic history of L shoulder pain, sciolosis, laminectomy     Limitations  Lifting;Sitting    Patient Stated Goals  To improve shoulder function and decrease pain    Currently in Pain?  Yes    Pain Score  6     Pain Location  Shoulder    Pain Orientation  Left    Pain Descriptors / Indicators  Aching    Pain Type  Chronic pain    Pain Onset  More than a month ago    Pain Frequency  Intermittent       TREATMENT Therapeutic Exercise  Resisted Shoulder ER/IR -- x3 with therapist support Pulleys with patient in sitting -- x 10 min to decrease pain and improve shoulder AROM Straight  arm pull downs in sitting with BTB -- x 20 Shoulder flexion AROM -- x 10 B Shoulder abduction AROM -- x 10   Neuromuscular Reeducation Educated on PNE: Pain comes from the brain Patient was educated on the concept of pain as an output of the brain, including nociception versus pain, inhibition and facilitation, and threat level using metaphors to promote deep learning    Performed exercises to improve shoulder strength and coordination of scapular muscular activation in standing/sitting   PT Education - 09/12/18 1439    Education Details  form/technique with exercise    Person(s) Educated  Patient    Methods  Explanation;Demonstration    Comprehension  Verbalized understanding;Returned demonstration          PT Long Term Goals - 09/12/18 1815      PT LONG TERM GOAL #1   Title  Patient will improve AROM to 120 degrees left shoulder with mild discomfort for reaching into cabinet or performing hair care     Baseline  95 AROM in sitting; 09/14/2017: 105 deg; 10/28/2017: 115; 12/23/2017: 120    Time  8    Period  Weeks    Status  Achieved  PT LONG TERM GOAL #2   Title  Patient will improve all shoulder strength 4+/5 for flexion and rotation to improve ability to manipulate objects in space.     Baseline  3/5 - 3+/5 within all measured motions; 09/14/2017: 3+/5 with all motion; 10/28/2017: 3+/5 for rotational movement and flexion improvement with extension and adduction to 4-/5; 4-/5 to 3+/5 for all shoulder movements; 02/07/2018: 4-/5 rotational movement IR/ER, 4/5 for flexion and abduction; 03/10/2018: 4/5-4+/5; 06/07/2018: 4/5- 3/5 B; 07/12/2018: 4+/5 for flexion, 4/5 for abduction on L shoulder 08/11/2018: 4+/5 for both flexion/abduction8/31/2020: flexion 4/5 - pain; abduction 3+/5 pain    Time  8    Period  Weeks    Status  On-going      PT LONG TERM GOAL #3   Title  Patient will  be independent with home program for strength and pain control for left UE/shoulder by 04/17/2015     Baseline  limited knowledge of pain control strategies or progression of exercises for left UE; 09/14/2017: Requires moderate cueing for exercise progression; 10/28/2017: Continues to require minimal cueing to correct; 12/23/2017: continues to require moderate cueing for exercise performance; 02/07/2018: independent with HEP    Time  8    Period  Weeks    Status  Achieved      PT LONG TERM GOAL #4   Title  Patient will have a worst pain of a 3/10 to allow for performance of household activities without increased pain    Baseline  8/10; 12/23/2017: 8/10; 02/07/2018: 7/10 Both shoulder and LBP; 03/10/2018: 6/10; 06/07/2018: 10/10; 07/12/2018: 7/10; 08/11/2018: 7/10; 09/12/2018 - 6/10    Time  8    Period  Weeks    Status  On-going      PT LONG TERM GOAL #5   Title  Patient will be able to stand for over 5 hours without increase in pain to allow for cooking for long periods of time wihtout increase in pain    Baseline  Increased pain after 4 hours from standing; 12/23/2017: Able to perform 5 hours of cooking with increased pain; 02/07/2018: Has not been cooking secondary to family difficulties; 03/10/2018: can cook 4 hours on cooking but has increased pain at after 4 hours; 06/07/2018: unable to test secondary to shoulder pain; 07/12/2018: reports she is able to perform with mild discomfort    Time  8    Period  Weeks    Status  Achieved      PT LONG TERM GOAL #6   Title  Patient will improve her MODI to under 48% to indicate significant improvement in lower back function and improvement with cooking in standing.    Baseline  58% MODI dysfunction; 03/10/2018: 52%: MODI dysfunction; 06/07/2018: will address NV; 07/11/18: 32%    Time  6    Period  Weeks    Status  Achieved      PT LONG TERM GOAL #7   Title  Patient will improve QuickDASH to under 49% to indicate significant improvement in L shoulder pain.    Baseline  59%; 09/12/2018: 56%    Time  4    Period  Weeks    Status  On-going             Plan - 09/12/18 1451    Clinical Impression Statement  Patient made limited progess with long term goals, however, this is most likely secondary to patient aggravating the pain in her shoulder right before her visit today. Patient demonstrates improvement  in QuickDASH score from a 59% to 56%. Throuhgout previous sessions, patient has been able to perform greater amount of exercises with ability to perform greater amount of repetitions compared to previous sessions. Patient demonstrates a subjective improvement with ability to cook with the affected UE, however, c/o limitations with her setting up her bed in the morning most specifically with pulling sheets in the morning. Patient will benefit from further skileld therapy focused on strenghtening throughout AROM's with her arms extended.    Personal Factors and Comorbidities  Age;Behavior Pattern;Past/Current Experience    Examination-Activity Limitations  Carry;Sleep;Lift    Examination-Participation Restrictions  Community Activity    Stability/Clinical Decision Making  Stable/Uncomplicated    Rehab Potential  Fair    Clinical Impairments Affecting Rehab Potential  (+) highly motivated, (-) age, decreased strength    PT Frequency  2x / week    PT Duration  8 weeks    PT Treatment/Interventions  Passive range of motion;Manual techniques;Electrical Stimulation;Cryotherapy;Moist Heat;Ultrasound;Therapeutic exercise;Therapeutic activities;Neuromuscular re-education;Patient/family education;Scar mobilization;Dry needling;Iontophoresis 4mg /ml Dexamethasone    PT Next Visit Plan  progress AROM exercises    PT Home Exercise Plan  See education section    Consulted and Agree with Plan of Care  Patient       Patient will benefit from skilled therapeutic intervention in order to improve the following deficits and impairments:  Pain, Impaired sensation, Decreased coordination, Decreased mobility, Increased muscle spasms, Decreased activity  tolerance, Decreased endurance, Decreased range of motion, Decreased strength  Visit Diagnosis: Stiffness of left shoulder, not elsewhere classified  Muscle weakness (generalized)  Chronic left shoulder pain     Problem List There are no active problems to display for this patient.   Myrene GalasWesley Brayla Pat, PT DPT 09/12/2018, 6:17 PM  [facility] [address] [place], [place], [id number] Phone: [phone]   Fax:  [phone]  Name: Suzanne Snyder MRN: [id number] Date of Birth: 22-Jul-1950

## 2018-09-14 ENCOUNTER — Other Ambulatory Visit: Payer: Self-pay

## 2018-09-14 ENCOUNTER — Ambulatory Visit: Payer: Medicare Other | Attending: Orthopedic Surgery

## 2018-09-14 DIAGNOSIS — G8929 Other chronic pain: Secondary | ICD-10-CM | POA: Diagnosis present

## 2018-09-14 DIAGNOSIS — M25612 Stiffness of left shoulder, not elsewhere classified: Secondary | ICD-10-CM | POA: Diagnosis present

## 2018-09-14 DIAGNOSIS — M25512 Pain in left shoulder: Secondary | ICD-10-CM | POA: Diagnosis present

## 2018-09-14 DIAGNOSIS — M6281 Muscle weakness (generalized): Secondary | ICD-10-CM | POA: Insufficient documentation

## 2018-09-14 NOTE — Therapy (Signed)
[facility] [address] [place], [address] Phone: [phone]   Fax:  [phone]  Physical Therapy Treatment  Patient Details  Name: Suzanne Snyder MRN: [id number] Date of Birth: 21-Jun-1950 Referring Provider (PT): Olga Millers MD   Encounter Date: 09/14/2018  PT End of Session - 09/14/18 1417    Visit Number  55    Number of Visits  65    Date for PT Re-Evaluation  10/10/18    Authorization Type  1/10    PT Start Time  1400    PT Stop Time  1445    PT Time Calculation (min)  45 min    Activity Tolerance  Patient tolerated treatment well    Behavior During Therapy  [facility] for tasks assessed/performed       Past Medical History:  Diagnosis Date  . Anxiety   . Diabetes mellitus without complication (HCC)   . Osteoporosis     Past Surgical History:  Procedure Laterality Date  . 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 ROTATOR CUFF REPAIR Left 2014 and 2015  . SPINE SURGERY N/A 1984   L4-5, not sure of procedure    There were no vitals filed for this visit.  Subjective Assessment - 09/14/18 1414    Subjective  Patient states she did not sleep well last night and is tired today. Patient also reports increased 'grinding' along her L shoulder and states increased pain with moving it overhead. She also reports increased feeling of instabiity.    Pertinent History  Chronic history of L shoulder pain, sciolosis, laminectomy     Limitations  Lifting;Sitting    Patient Stated Goals  To improve shoulder function and decrease pain    Currently in Pain?  Yes    Pain Score  6     Pain Location  Shoulder    Pain Orientation  Left    Pain Descriptors / Indicators  Aching    Pain Type  Chronic pain    Pain Onset  More than a month ago    Pain Frequency  Intermittent        TREATMENT Therapeutic Exercise  High row in sitting - x 15 Pulleys with patient in sitting -- x 10 min to decrease pain and improve  shoulder AROM Straight arm pull downs in sitting with BTB -- x 20 Sitting scapular retraction with rows - x 20   Neuromuscular Reeducation Educated on PNE: Pain comes from the brain. Patient was educated on the concept of pain as an output of the brain, including nociception versus pain, inhibition and facilitation, and threat level using metaphors to promote deep learning.   Educated on role of ion channels and their impact on pain and nerve sensitivity. Reviewed purpose and types of receptors and ion channels and how they apply in different situations. Patient given journaling exercise for home.    Performed exercises to improve shoulder strength and coordination of scapular muscular activation in standing/sitting   PT Education - 09/14/18 1416    Education Details  form/technique with exercise    Person(s) Educated  Patient    Methods  Explanation;Demonstration    Comprehension  Verbalized understanding;Returned demonstration          PT Long Term Goals - 09/12/18 1815      PT LONG TERM GOAL #1   Title  Patient will improve AROM to 120 degrees left shoulder with mild discomfort for reaching into  cabinet or performing hair care     Baseline  95 AROM in sitting; 09/14/2017: 105 deg; 10/28/2017: 115; 12/23/2017: 120    Time  8    Period  Weeks    Status  Achieved      PT LONG TERM GOAL #2   Title  Patient will improve all shoulder strength 4+/5 for flexion and rotation to improve ability to manipulate objects in space.     Baseline  3/5 - 3+/5 within all measured motions; 09/14/2017: 3+/5 with all motion; 10/28/2017: 3+/5 for rotational movement and flexion improvement with extension and adduction to 4-/5; 4-/5 to 3+/5 for all shoulder movements; 02/07/2018: 4-/5 rotational movement IR/ER, 4/5 for flexion and abduction; 03/10/2018: 4/5-4+/5; 06/07/2018: 4/5- 3/5 B; 07/12/2018: 4+/5 for flexion, 4/5 for abduction on L shoulder 08/11/2018: 4+/5 for both flexion/abduction8/31/2020: flexion 4/5  - pain; abduction 3+/5 pain    Time  8    Period  Weeks    Status  On-going      PT LONG TERM GOAL #3   Title  Patient will  be independent with home program for strength and pain control for left UE/shoulder by 04/17/2015    Baseline  limited knowledge of pain control strategies or progression of exercises for left UE; 09/14/2017: Requires moderate cueing for exercise progression; 10/28/2017: Continues to require minimal cueing to correct; 12/23/2017: continues to require moderate cueing for exercise performance; 02/07/2018: independent with HEP    Time  8    Period  Weeks    Status  Achieved      PT LONG TERM GOAL #4   Title  Patient will have a worst pain of a 3/10 to allow for performance of household activities without increased pain    Baseline  8/10; 12/23/2017: 8/10; 02/07/2018: 7/10 Both shoulder and LBP; 03/10/2018: 6/10; 06/07/2018: 10/10; 07/12/2018: 7/10; 08/11/2018: 7/10; 09/12/2018 - 6/10    Time  8    Period  Weeks    Status  On-going      PT LONG TERM GOAL #5   Title  Patient will be able to stand for over 5 hours without increase in pain to allow for cooking for long periods of time wihtout increase in pain    Baseline  Increased pain after 4 hours from standing; 12/23/2017: Able to perform 5 hours of cooking with increased pain; 02/07/2018: Has not been cooking secondary to family difficulties; 03/10/2018: can cook 4 hours on cooking but has increased pain at after 4 hours; 06/07/2018: unable to test secondary to shoulder pain; 07/12/2018: reports she is able to perform with mild discomfort    Time  8    Period  Weeks    Status  Achieved      PT LONG TERM GOAL #6   Title  Patient will improve her MODI to under 48% to indicate significant improvement in lower back function and improvement with cooking in standing.    Baseline  58% MODI dysfunction; 03/10/2018: 52%: MODI dysfunction; 06/07/2018: will address NV; 07/11/18: 32%    Time  6    Period  Weeks    Status  Achieved      PT  LONG TERM GOAL #7   Title  Patient will improve QuickDASH to under 49% to indicate significant improvement in L shoulder pain.    Baseline  59%; 09/12/2018: 56%    Time  4    Period  Weeks    Status  On-going  Plan - 09/14/18 1422    Clinical Impression Statement  Continued to focus on improving pain and spasms through movement (shoulder flexion/and scapular retraction) and performing PNE. Patient able to apply pain learning priniciples to life and her specific situation. Patient demonstrates improvement with PNE knowledge and is able to recall information from the previous session. Patient will benefit from further skilled therapy to return to prior level of function.    Personal Factors and Comorbidities  Age;Behavior Pattern;Past/Current Experience    Examination-Activity Limitations  Carry;Sleep;Lift    Examination-Participation Restrictions  Community Activity    Stability/Clinical Decision Making  Stable/Uncomplicated    Rehab Potential  Fair    Clinical Impairments Affecting Rehab Potential  (+) highly motivated, (-) age, decreased strength    PT Frequency  2x / week    PT Duration  8 weeks    PT Treatment/Interventions  Passive range of motion;Manual techniques;Electrical Stimulation;Cryotherapy;Moist Heat;Ultrasound;Therapeutic exercise;Therapeutic activities;Neuromuscular re-education;Patient/family education;Scar mobilization;Dry needling;Iontophoresis 4mg /ml Dexamethasone    PT Next Visit Plan  progress AROM exercises    PT Home Exercise Plan  See education section    Consulted and Agree with Plan of Care  Patient       Patient will benefit from skilled therapeutic intervention in order to improve the following deficits and impairments:  Pain, Impaired sensation, Decreased coordination, Decreased mobility, Increased muscle spasms, Decreased activity tolerance, Decreased endurance, Decreased range of motion, Decreased strength  Visit Diagnosis: Stiffness of left  shoulder, not elsewhere classified  Muscle weakness (generalized)  Chronic left shoulder pain     Problem List There are no active problems to display for this patient.   Myrene GalasWesley Pasha Broad, PT DPT 09/14/2018, 2:56 PM  [facility] [address] [place], [place], [id number] Phone: [phone]   Fax:  [phone]  Name: Henreitta LeberDeborah L Ducharme MRN: [id number] Date of Birth: [birth year]

## 2018-09-21 ENCOUNTER — Ambulatory Visit: Payer: Medicare Other

## 2018-09-21 ENCOUNTER — Other Ambulatory Visit: Payer: Self-pay

## 2018-09-21 DIAGNOSIS — M6281 Muscle weakness (generalized): Secondary | ICD-10-CM

## 2018-09-21 DIAGNOSIS — G8929 Other chronic pain: Secondary | ICD-10-CM

## 2018-09-21 DIAGNOSIS — M25612 Stiffness of left shoulder, not elsewhere classified: Secondary | ICD-10-CM | POA: Diagnosis not present

## 2018-09-21 NOTE — Therapy (Signed)
[place] [facility] [address] [place], [address] Phone: [phone]   Fax:  [phone]  Physical Therapy Treatment  Patient Details  Name: Suzanne Snyder MRN: [id number] Date of Birth: 1950-07-31 Referring Provider (PT): Kathyrn Sheriff MD   Encounter Date: 09/21/2018  PT End of Session - 09/21/18 1237    Visit Number  56    Number of Visits  65    Date for PT Re-Evaluation  10/10/18    Authorization Type  2/10    PT Start Time  1030    PT Stop Time  1115    PT Time Calculation (min)  45 min    Activity Tolerance  Patient tolerated treatment well    Behavior During Therapy  [facility] for tasks assessed/performed       Past Medical History:  Diagnosis Date  . Anxiety   . Diabetes mellitus without complication ([place])   . Osteoporosis     Past Surgical History:  Procedure Laterality Date  . 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 ROTATOR CUFF REPAIR Left 2014 and 2015  . SPINE SURGERY N/A 1984   L4-5, not sure of procedure    There were no vitals filed for this visit.  Subjective Assessment - 09/21/18 1200    Subjective  Patient states she continues to have difficulty sleeping and would like to review techniques. Patient states her shoulder feels like it has been instable recently.    Pertinent History  Chronic history of L shoulder pain, sciolosis, laminectomy     Limitations  Lifting;Sitting    Patient Stated Goals  To improve shoulder function and decrease pain    Currently in Pain?  Yes    Pain Score  5     Pain Location  Shoulder    Pain Orientation  Left    Pain Descriptors / Indicators  Aching    Pain Type  Chronic pain    Pain Onset  More than a month ago    Pain Frequency  Intermittent      TREATMENT Therapeutic Exercise  High row in sitting - x 15 Pulleys with patient in sitting -- x 10 min to decrease pain and improve shoulder AROM Straight arm scapular retraction - x 20  UE Ranger  overhead flexion - x 20 3# UE Ranger Overhead Shoulder Abduction/Adduction - x 20    Neuromuscular Reeducation Educated patient extensively on sleep hygiene. Educated on room set up, activities to do before going to bed, activities to avoid perform bed, educated on importance of keep a sleep journal, educated on setting up and time to go to bed and get up each night.      Performed exercises to improve shoulder strength and coordination of scapular muscular activation in standing/sitting    PT Education - 09/21/18 1236    Education Details  form/technique with exercise; sleep hygiene    Person(s) Educated  Patient    Methods  Demonstration;Explanation    Comprehension  Verbalized understanding;Returned demonstration          PT Long Term Goals - 09/12/18 1815      PT LONG TERM GOAL #1   Title  Patient will improve AROM to 120 degrees left shoulder with mild discomfort for reaching into cabinet or performing hair care     Baseline  95 AROM in sitting; 09/14/2017: 105 deg; 10/28/2017: 115; 12/23/2017: 120    Time  8    Period  Weeks    Status  Achieved      PT LONG TERM GOAL #2   Title  Patient will improve all shoulder strength 4+/5 for flexion and rotation to improve ability to manipulate objects in space.     Baseline  3/5 - 3+/5 within all measured motions; 09/14/2017: 3+/5 with all motion; 10/28/2017: 3+/5 for rotational movement and flexion improvement with extension and adduction to 4-/5; 4-/5 to 3+/5 for all shoulder movements; 02/07/2018: 4-/5 rotational movement IR/ER, 4/5 for flexion and abduction; 03/10/2018: 4/5-4+/5; 06/07/2018: 4/5- 3/5 B; 07/12/2018: 4+/5 for flexion, 4/5 for abduction on L shoulder 08/11/2018: 4+/5 for both flexion/abduction8/31/2020: flexion 4/5 - pain; abduction 3+/5 pain    Time  8    Period  Weeks    Status  On-going      PT LONG TERM GOAL #3   Title  Patient will  be independent with home program for strength and pain control for left UE/shoulder by  04/17/2015    Baseline  limited knowledge of pain control strategies or progression of exercises for left UE; 09/14/2017: Requires moderate cueing for exercise progression; 10/28/2017: Continues to require minimal cueing to correct; 12/23/2017: continues to require moderate cueing for exercise performance; 02/07/2018: independent with HEP    Time  8    Period  Weeks    Status  Achieved      PT LONG TERM GOAL #4   Title  Patient will have a worst pain of a 3/10 to allow for performance of household activities without increased pain    Baseline  8/10; 12/23/2017: 8/10; 02/07/2018: 7/10 Both shoulder and LBP; 03/10/2018: 6/10; 06/07/2018: 10/10; 07/12/2018: 7/10; 08/11/2018: 7/10; 09/12/2018 - 6/10    Time  8    Period  Weeks    Status  On-going      PT LONG TERM GOAL #5   Title  Patient will be able to stand for over 5 hours without increase in pain to allow for cooking for long periods of time wihtout increase in pain    Baseline  Increased pain after 4 hours from standing; 12/23/2017: Able to perform 5 hours of cooking with increased pain; 02/07/2018: Has not been cooking secondary to family difficulties; 03/10/2018: can cook 4 hours on cooking but has increased pain at after 4 hours; 06/07/2018: unable to test secondary to shoulder pain; 07/12/2018: reports she is able to perform with mild discomfort    Time  8    Period  Weeks    Status  Achieved      PT LONG TERM GOAL #6   Title  Patient will improve her MODI to under 48% to indicate significant improvement in lower back function and improvement with cooking in standing.    Baseline  58% MODI dysfunction; 03/10/2018: 52%: MODI dysfunction; 06/07/2018: will address NV; 07/11/18: 32%    Time  6    Period  Weeks    Status  Achieved      PT LONG TERM GOAL #7   Title  Patient will improve QuickDASH to under 49% to indicate significant improvement in L shoulder pain.    Baseline  59%; 09/12/2018: 56%    Time  4    Period  Weeks    Status  On-going             Plan - 09/21/18 1237    Clinical Impression Statement  Patient demonstrates increased pain with performing shoulder flexion under weight, indicating poor strength and shoulder coordination. Patient continues  to demonstrate increased pain which patient's decrease in sleep may be contributing (patient gets ~3 hours of sleep per night). Patient's increased pain most likely produced from from poor motor control and increased sensitivity of the nervous system. Patient will benefit from further skilled therapy to return to prior level of function.    Personal Factors and Comorbidities  Age;Behavior Pattern;Past/Current Experience    Examination-Activity Limitations  Carry;Sleep;Lift    Examination-Participation Restrictions  Community Activity    Stability/Clinical Decision Making  Stable/Uncomplicated    Rehab Potential  Fair    Clinical Impairments Affecting Rehab Potential  (+) highly motivated, (-) age, decreased strength    PT Frequency  2x / week    PT Duration  8 weeks    PT Treatment/Interventions  Passive range of motion;Manual techniques;Electrical Stimulation;Cryotherapy;Moist Heat;Ultrasound;Therapeutic exercise;Therapeutic activities;Neuromuscular re-education;Patient/family education;Scar mobilization;Dry needling;Iontophoresis 4mg /ml Dexamethasone    PT Next Visit Plan  progress AROM exercises    PT Home Exercise Plan  See education section    Consulted and Agree with Plan of Care  Patient       Patient will benefit from skilled therapeutic intervention in order to improve the following deficits and impairments:  Pain, Impaired sensation, Decreased coordination, Decreased mobility, Increased muscle spasms, Decreased activity tolerance, Decreased endurance, Decreased range of motion, Decreased strength  Visit Diagnosis: Stiffness of left shoulder, not elsewhere classified  Muscle weakness (generalized)  Chronic left shoulder pain     Problem List There are no  active problems to display for this patient.   [name], PT DPT 09/21/2018, 12:49 PM  [facility] [address] [place], [address] Phone: [phone]   Fax:  [phone]  Name: Suzanne Snyder MRN: [id number] Date of Birth: [birth year]

## 2018-09-28 ENCOUNTER — Ambulatory Visit: Payer: Medicare Other

## 2018-09-28 ENCOUNTER — Other Ambulatory Visit: Payer: Self-pay

## 2018-09-28 DIAGNOSIS — M25612 Stiffness of left shoulder, not elsewhere classified: Secondary | ICD-10-CM

## 2018-09-28 DIAGNOSIS — M6281 Muscle weakness (generalized): Secondary | ICD-10-CM

## 2018-09-28 NOTE — Therapy (Signed)
[place] [facility] [address], [place], [address] Phone: [phone]   Fax:  [phone]  Patient Details  Name: Suzanne Snyder MRN: [id number] Date of Birth: 11-20-1950 Referring Provider:  Henry Russel, MD  Encounter Date: 09/28/2018   Patient reports noticing bruise from axilla down arm after last treatment, worsening for three days, but clearing up after. Was not able to move arm well. 2nd such episode since start of care. Pt reports she did not receive corticosteroid injection in neck from MD. "Feels like it's coming out of the socket" with picking up glass of water.  Did not perform therapy today secondary to increased bruising symptoms along the L shoulder, which is the second time this has happened in the past 2 months. Recommended patient to follow up with physician secondary to increase in symptoms of instability and the 2 episodes of ecchymosis-like bruising.  Blythe Stanford, PT DPT 09/28/2018, 2:12 PM  [place] [facility] [address] [place], [address] Phone: [phone]   Fax:  [phone]

## 2018-10-03 ENCOUNTER — Ambulatory Visit: Payer: Medicare Other

## 2018-10-05 ENCOUNTER — Ambulatory Visit: Payer: Medicare Other

## 2018-10-10 ENCOUNTER — Ambulatory Visit: Payer: Medicare Other

## 2018-10-12 ENCOUNTER — Ambulatory Visit: Payer: Medicare Other

## 2018-10-19 ENCOUNTER — Ambulatory Visit: Payer: Medicare Other | Attending: Orthopedic Surgery

## 2018-10-19 ENCOUNTER — Other Ambulatory Visit: Payer: Self-pay

## 2018-10-19 DIAGNOSIS — G8929 Other chronic pain: Secondary | ICD-10-CM | POA: Diagnosis present

## 2018-10-19 DIAGNOSIS — M25512 Pain in left shoulder: Secondary | ICD-10-CM | POA: Insufficient documentation

## 2018-10-19 DIAGNOSIS — M6281 Muscle weakness (generalized): Secondary | ICD-10-CM | POA: Diagnosis present

## 2018-10-19 DIAGNOSIS — M25612 Stiffness of left shoulder, not elsewhere classified: Secondary | ICD-10-CM | POA: Diagnosis not present

## 2018-10-20 NOTE — Therapy (Signed)
[facility] [address], [place], [address] Phone: [phone]   Fax:  [phone]  Physical Therapy Treatment  Patient Details  Name: Suzanne Snyder MRN: [id number] Date of Birth: 1950-11-02 Referring Provider (PT): Olga Millers MD   Encounter Date: 10/19/2018  PT End of Session - 10/20/18 1759    Visit Number  57    Number of Visits  65    Date for PT Re-Evaluation  11/17/18    Authorization Type  1/10    PT Start Time  1845    PT Stop Time  1930    PT Time Calculation (min)  45 min    Activity Tolerance  Patient tolerated treatment well    Behavior During Therapy  [facility] for tasks assessed/performed       Past Medical History:  Diagnosis Date  . Anxiety   . Diabetes mellitus without complication (HCC)   . Osteoporosis     Past Surgical History:  Procedure Laterality Date  . 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 ROTATOR CUFF REPAIR Left 2014 and 2015  . SPINE SURGERY N/A 1984   L4-5, not sure of procedure    There were no vitals filed for this visit.  Subjective Assessment - 10/19/18 1855    Subjective  Patient reports her shoulder has been hurting at night. States she has been using her sleep journal but her journal sleep was stolen by her daughter. Patient states her shoulder continues to bother her with lifting it overhead.    Pertinent History  Chronic history of L shoulder pain, sciolosis, laminectomy     Limitations  Lifting;Sitting    Patient Stated Goals  To improve shoulder function and decrease pain    Currently in Pain?  Yes    Pain Score  5     Pain Orientation  Left    Pain Descriptors / Indicators  Aching    Pain Type  Chronic pain    Pain Onset  More than a month ago    Pain Frequency  Intermittent       TREATMENT Therapeutic Exercise UE ranger flexion in standing  Finger extension on wall Finger extension with Yellow tubing Finger extension on table Prayer hand  stretch in sitting Shoulder flexion with ball on wall - x 20   Neuromuscular Reeducation Progressive muscle relaxation - PMR x 15 min  Gradually performed contact - relax from UE and LEs throughout the session. Performed to decrease muscular tension throughout the body and to lower the protective responses experienced during exercises.  Educated patient on connection between exercise performance and stress. Educated patient on ways to decrease stress at home and at work to help improve control of body  ----   PT Education - 10/20/18 1759    Education Details  form/technique; sleep hygiene    Person(s) Educated  Patient    Methods  Explanation;Demonstration    Comprehension  Verbalized understanding;Returned demonstration          PT Long Term Goals - 10/20/18 1801      PT LONG TERM GOAL #1   Title  Patient will improve AROM to 120 degrees left shoulder with mild discomfort for reaching into cabinet or performing hair care     Baseline  95 AROM in sitting; 09/14/2017: 105 deg; 10/28/2017: 115; 12/23/2017: 120    Time  8    Period  Weeks    Status  Achieved  PT LONG TERM GOAL #2   Title  Patient will improve all shoulder strength 4+/5 for flexion and rotation to improve ability to manipulate objects in space.     Baseline  3/5 - 3+/5 within all measured motions; 09/14/2017: 3+/5 with all motion; 10/28/2017: 3+/5 for rotational movement and flexion improvement with extension and adduction to 4-/5; 4-/5 to 3+/5 for all shoulder movements; 02/07/2018: 4-/5 rotational movement IR/ER, 4/5 for flexion and abduction; 03/10/2018: 4/5-4+/5; 06/07/2018: 4/5- 3/5 B; 07/12/2018: 4+/5 for flexion, 4/5 for abduction on L shoulder 08/11/2018: 4+/5 for both flexion/abduction8/31/2020: flexion 4/5 - pain; abduction 3+/5 pain; 10/20/2018: 4/5 for flexion/abduction - pain    Time  8    Period  Weeks    Status  On-going      PT LONG TERM GOAL #3   Title  Patient will  be independent with home program  for strength and pain control for left UE/shoulder by 04/17/2015    Baseline  limited knowledge of pain control strategies or progression of exercises for left UE; 09/14/2017: Requires moderate cueing for exercise progression; 10/28/2017: Continues to require minimal cueing to correct; 12/23/2017: continues to require moderate cueing for exercise performance; 02/07/2018: independent with HEP    Time  8    Period  Weeks    Status  Achieved      PT LONG TERM GOAL #4   Title  Patient will have a worst pain of a 3/10 to allow for performance of household activities without increased pain    Baseline  8/10; 12/23/2017: 8/10; 02/07/2018: 7/10 Both shoulder and LBP; 03/10/2018: 6/10; 06/07/2018: 10/10; 07/12/2018: 7/10; 08/11/2018: 7/10; 09/12/2018 - 6/10; 10/08 - 6/10    Time  8    Period  Weeks    Status  On-going      PT LONG TERM GOAL #5   Title  Patient will be able to stand for over 5 hours without increase in pain to allow for cooking for long periods of time wihtout increase in pain    Baseline  Increased pain after 4 hours from standing; 12/23/2017: Able to perform 5 hours of cooking with increased pain; 02/07/2018: Has not been cooking secondary to family difficulties; 03/10/2018: can cook 4 hours on cooking but has increased pain at after 4 hours; 06/07/2018: unable to test secondary to shoulder pain; 07/12/2018: reports she is able to perform with mild discomfort    Time  8    Period  Weeks    Status  Achieved      PT LONG TERM GOAL #6   Title  Patient will improve her MODI to under 48% to indicate significant improvement in lower back function and improvement with cooking in standing.    Baseline  58% MODI dysfunction; 03/10/2018: 52%: MODI dysfunction; 06/07/2018: will address NV; 07/11/18: 32%    Time  6    Period  Weeks    Status  Achieved      PT LONG TERM GOAL #7   Title  Patient will improve QuickDASH to under 49% to indicate significant improvement in L shoulder pain.    Baseline  59%;  09/12/2018: 56%; 10/20/2018: deffered until NV    Time  4    Period  Weeks    Status  Deferred            Plan - 10/20/18 1802    Clinical Impression Statement  Patient's symptoms relatively unchanged since the previous session as she has not been able to attend therapy  for >3weeks secondary to exploratory visitation to orthopedic sugeon for unknown ecchymosis of her affected shoulder. Patient's shoulder strength actually improved compared to the previous recertification date indicating improvement however she continues to have significant limitations in strength, shoulder function, and increased pain with movement. Continued to address patient's increase in pain through pain science and decreasing global anxiety levels which are contributing to her increase in pain. Patient responds well to PMR, with less pain compared to the beginning of the session. Patient will beenfit form further skilled threapy to return to prior level of function. Decreasing POC to 1xweek.    Personal Factors and Comorbidities  Age;Behavior Pattern;Past/Current Experience    Examination-Activity Limitations  Carry;Sleep;Lift    Examination-Participation Restrictions  Community Activity    Stability/Clinical Decision Making  Stable/Uncomplicated    Rehab Potential  Fair    Clinical Impairments Affecting Rehab Potential  (+) highly motivated, (-) age, decreased strength    PT Frequency  2x / week    PT Duration  8 weeks    PT Treatment/Interventions  Passive range of motion;Manual techniques;Electrical Stimulation;Cryotherapy;Moist Heat;Ultrasound;Therapeutic exercise;Therapeutic activities;Neuromuscular re-education;Patient/family education;Scar mobilization;Dry needling;Iontophoresis 4mg /ml Dexamethasone    PT Next Visit Plan  progress AROM exercises    PT Home Exercise Plan  See education section    Consulted and Agree with Plan of Care  Patient       Patient will benefit from skilled therapeutic intervention in  order to improve the following deficits and impairments:  Pain, Impaired sensation, Decreased coordination, Decreased mobility, Increased muscle spasms, Decreased activity tolerance, Decreased endurance, Decreased range of motion, Decreased strength  Visit Diagnosis: Stiffness of left shoulder, not elsewhere classified  Muscle weakness (generalized)  Chronic left shoulder pain     Problem List There are no active problems to display for this patient.   Myrene GalasWesley Antanasia Kaczynski, PT DPT 10/20/2018, 6:07 PM   [facility] PHYS[facility]IC[facility] [address] [place], [place], [address] Phone: [phone]   Fax:  [phone]  Name: Henreitta LeberDeborah L Judon MRN: [id number] Date of Birth: 1950-05-16

## 2018-10-26 ENCOUNTER — Ambulatory Visit: Payer: Medicare Other

## 2018-10-26 ENCOUNTER — Other Ambulatory Visit: Payer: Self-pay

## 2018-10-26 DIAGNOSIS — M6281 Muscle weakness (generalized): Secondary | ICD-10-CM

## 2018-10-26 DIAGNOSIS — M25612 Stiffness of left shoulder, not elsewhere classified: Secondary | ICD-10-CM | POA: Diagnosis not present

## 2018-10-27 NOTE — Therapy (Signed)
[facility] [address] [place], [address] Phone: [phone]   Fax:  [phone]  Physical Therapy Treatment  Patient Details  Name: Suzanne Snyder MRN: [id number] Date of Birth: 08-04-1950 Referring Provider (PT): Kathyrn Sheriff MD   Encounter Date: 10/26/2018  PT End of Session - 10/26/18 1602    Visit Number  6    Number of Visits  65    Date for PT Re-Evaluation  11/17/18    Authorization Type  2/10    PT Start Time  7253    PT Stop Time  1430    PT Time Calculation (min)  45 min    Activity Tolerance  Patient tolerated treatment well    Behavior During Therapy  [facility] for tasks assessed/performed       Past Medical History:  Diagnosis Date  . Anxiety   . Diabetes mellitus without complication ([place])   . Osteoporosis     Past Surgical History:  Procedure Laterality Date  . 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 ROTATOR CUFF REPAIR Left 2014 and 2015  . SPINE SURGERY N/A 1984   L4-5, not sure of procedure    There were no vitals filed for this visit.  Subjective Assessment - 10/26/18 1554    Subjective  Patient reports she has not been sleeping well and reports she has been trying to perform her HEP but states she has been performing exercises as much as possible.    Pertinent History  Chronic history of L shoulder pain, sciolosis, laminectomy     Limitations  Lifting;Sitting    Patient Stated Goals  To improve shoulder function and decrease pain    Currently in Pain?  Yes    Pain Score  5     Pain Location  Shoulder    Pain Orientation  Left    Pain Descriptors / Indicators  Aching    Pain Type  Chronic pain    Pain Onset  More than a month ago    Pain Frequency  Intermittent       TREATMENT Therapeutic Exercise Overhead shoulder flexion AROM -- x 10 Overhead shoulder flexion AAROM -- 3 x 6 With therapist assistance Shoulder rows and scapular retraction in sitting -- x 10   Thoracic extension in sitting with use of a towel and feet elevated -- x10  Performed exercises to address shoulder muscular weakness  PT Education - 10/26/18 1602    Education Details  form/technique; sleep hygiene; education on habit formation    Person(s) Educated  Patient    Methods  Explanation;Demonstration    Comprehension  Verbalized understanding;Returned demonstration          PT Long Term Goals - 10/20/18 1801      PT LONG TERM GOAL #1   Title  Patient will improve AROM to 120 degrees left shoulder with mild discomfort for reaching into cabinet or performing hair care     Baseline  95 AROM in sitting; 09/14/2017: 105 deg; 10/28/2017: 115; 12/23/2017: 120    Time  8    Period  Weeks    Status  Achieved      PT LONG TERM GOAL #2   Title  Patient will improve all shoulder strength 4+/5 for flexion and rotation to improve ability to manipulate objects in space.     Baseline  3/5 - 3+/5 within all measured motions; 09/14/2017: 3+/5 with all motion; 10/28/2017: 3+/5 for rotational  movement and flexion improvement with extension and adduction to 4-/5; 4-/5 to 3+/5 for all shoulder movements; 02/07/2018: 4-/5 rotational movement IR/ER, 4/5 for flexion and abduction; 03/10/2018: 4/5-4+/5; 06/07/2018: 4/5- 3/5 B; 07/12/2018: 4+/5 for flexion, 4/5 for abduction on L shoulder 08/11/2018: 4+/5 for both flexion/abduction8/31/2020: flexion 4/5 - pain; abduction 3+/5 pain; 10/20/2018: 4/5 for flexion/abduction - pain    Time  8    Period  Weeks    Status  On-going      PT LONG TERM GOAL #3   Title  Patient will  be independent with home program for strength and pain control for left UE/shoulder by 04/17/2015    Baseline  limited knowledge of pain control strategies or progression of exercises for left UE; 09/14/2017: Requires moderate cueing for exercise progression; 10/28/2017: Continues to require minimal cueing to correct; 12/23/2017: continues to require moderate cueing for exercise performance;  02/07/2018: independent with HEP    Time  8    Period  Weeks    Status  Achieved      PT LONG TERM GOAL #4   Title  Patient will have a worst pain of a 3/10 to allow for performance of household activities without increased pain    Baseline  8/10; 12/23/2017: 8/10; 02/07/2018: 7/10 Both shoulder and LBP; 03/10/2018: 6/10; 06/07/2018: 10/10; 07/12/2018: 7/10; 08/11/2018: 7/10; 09/12/2018 - 6/10; 10/08 - 6/10    Time  8    Period  Weeks    Status  On-going      PT LONG TERM GOAL #5   Title  Patient will be able to stand for over 5 hours without increase in pain to allow for cooking for long periods of time wihtout increase in pain    Baseline  Increased pain after 4 hours from standing; 12/23/2017: Able to perform 5 hours of cooking with increased pain; 02/07/2018: Has not been cooking secondary to family difficulties; 03/10/2018: can cook 4 hours on cooking but has increased pain at after 4 hours; 06/07/2018: unable to test secondary to shoulder pain; 07/12/2018: reports she is able to perform with mild discomfort    Time  8    Period  Weeks    Status  Achieved      PT LONG TERM GOAL #6   Title  Patient will improve her MODI to under 48% to indicate significant improvement in lower back function and improvement with cooking in standing.    Baseline  58% MODI dysfunction; 03/10/2018: 52%: MODI dysfunction; 06/07/2018: will address NV; 07/11/18: 32%    Time  6    Period  Weeks    Status  Achieved      PT LONG TERM GOAL #7   Title  Patient will improve QuickDASH to under 49% to indicate significant improvement in L shoulder pain.    Baseline  59%; 09/12/2018: 56%; 10/20/2018: deffered until NV    Time  4    Period  Weeks    Status  Deferred            Plan - 10/26/18 1603    Clinical Impression Statement  Educated patient on habit formation and prioritizing sleep and eating earlier during the day and making that part of her daily routine. Also addressed thoracic mobility to assist further with  overhead motion and less subsequent stress with performing overhead motions. Patient demosntrates good understanding and will benefit from further skilled therapy to return to prior level of function.    Personal Factors and Comorbidities  Age;Behavior  Pattern;Past/Current Experience    Examination-Activity Limitations  Carry;Sleep;Lift    Examination-Participation Restrictions  Community Activity    Stability/Clinical Decision Making  Stable/Uncomplicated    Rehab Potential  Fair    Clinical Impairments Affecting Rehab Potential  (+) highly motivated, (-) age, decreased strength    PT Frequency  2x / week    PT Duration  8 weeks    PT Treatment/Interventions  Passive range of motion;Manual techniques;Electrical Stimulation;Cryotherapy;Moist Heat;Ultrasound;Therapeutic exercise;Therapeutic activities;Neuromuscular re-education;Patient/family education;Scar mobilization;Dry needling;Iontophoresis 4mg /ml Dexamethasone    PT Next Visit Plan  progress AROM exercises    PT Home Exercise Plan  See education section    Consulted and Agree with Plan of Care  Patient       Patient will benefit from skilled therapeutic intervention in order to improve the following deficits and impairments:  Pain, Impaired sensation, Decreased coordination, Decreased mobility, Increased muscle spasms, Decreased activity tolerance, Decreased endurance, Decreased range of motion, Decreased strength  Visit Diagnosis: Stiffness of left shoulder, not elsewhere classified  Muscle weakness (generalized)     Problem List There are no active problems to display for this patient.   , PT DPT 10/27/2018, 8:15 AM  [facility] AND SPORTS MEDICINE [address], [place] Phone: [phone]   Fax:  [phone]  Name: RENLEIGH OUELLET MRN: [name] Date of Birth: January 24, 1950

## 2018-11-02 ENCOUNTER — Ambulatory Visit: Payer: Medicare Other

## 2018-11-02 ENCOUNTER — Other Ambulatory Visit: Payer: Self-pay

## 2018-11-02 DIAGNOSIS — M6281 Muscle weakness (generalized): Secondary | ICD-10-CM

## 2018-11-02 DIAGNOSIS — M25612 Stiffness of left shoulder, not elsewhere classified: Secondary | ICD-10-CM | POA: Diagnosis not present

## 2018-11-02 DIAGNOSIS — M25512 Pain in left shoulder: Secondary | ICD-10-CM

## 2018-11-02 DIAGNOSIS — G8929 Other chronic pain: Secondary | ICD-10-CM

## 2018-11-02 NOTE — Therapy (Signed)
[place] [facility] [address], [place], [address] Phone: [phone]   Fax:  [phone]  Physical Therapy Treatment  Patient Details  Name: Suzanne Snyder MRN: [id number] Date of Birth: 1950/04/29 Referring Provider (PT): Olga Millers MD   Encounter Date: 11/02/2018  PT End of Session - 11/02/18 1441    Visit Number  59    Number of Visits  65    Date for PT Re-Evaluation  11/17/18    Authorization Type  2/10    PT Start Time  1433    PT Stop Time  1521    PT Time Calculation (min)  48 min    Activity Tolerance  Patient tolerated treatment well;Patient limited by lethargy    Behavior During Therapy  [facility] for tasks assessed/performed       Past Medical History:  Diagnosis Date  . Anxiety   . Diabetes mellitus without complication (HCC)   . Osteoporosis     Past Surgical History:  Procedure Laterality Date  . 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 ROTATOR CUFF REPAIR Left 2014 and 2015  . SPINE SURGERY N/A 1984   L4-5, not sure of procedure    There were no vitals filed for this visit.  Subjective Assessment - 11/02/18 1434    Subjective  Patient reports poor sleep and frustration with diabetes and family issues. Presents with wrist brace R reporting falling asleep in chair and falling from chair resulting in "bad sprain". Reports second fall off side of bed after falling asleep. Patient has not kept up with sleep journal secondary to swelling in fingers    Pertinent History  Chronic history of L shoulder pain, sciolosis, laminectomy     Limitations  Lifting;Sitting    Patient Stated Goals  To improve shoulder function and decrease pain    Currently in Pain?  Yes    Pain Score  5     Pain Location  Shoulder    Pain Orientation  Left    Pain Onset  More than a month ago    Pain Frequency  Intermittent         Education: sleep hygiene: importance of "turning off" time. Behavior modification  techniques to manage stress and improve sleep. Appropriate use of postural brace in response to pt questions.  TE Ball tosses emphasizing flexion, abduction, ER x 30 ea for shoulder ROM, endurance, and ballistic neuromotor control AROM flexion, scaption, abduction x 5 to increase active overhead ROM    PT Education - 11/02/18 1439    Education Details  form/technique; sleep hygiene    Person(s) Educated  Patient    Methods  Explanation;Demonstration    Comprehension  Verbalized understanding;Returned demonstration          PT Long Term Goals - 10/20/18 1801      PT LONG TERM GOAL #1   Title  Patient will improve AROM to 120 degrees left shoulder with mild discomfort for reaching into cabinet or performing hair care     Baseline  95 AROM in sitting; 09/14/2017: 105 deg; 10/28/2017: 115; 12/23/2017: 120    Time  8    Period  Weeks    Status  Achieved      PT LONG TERM GOAL #2   Title  Patient will improve all shoulder strength 4+/5 for flexion and rotation to improve ability to manipulate objects in space.     Baseline  3/5 - 3+/5 within  all measured motions; 09/14/2017: 3+/5 with all motion; 10/28/2017: 3+/5 for rotational movement and flexion improvement with extension and adduction to 4-/5; 4-/5 to 3+/5 for all shoulder movements; 02/07/2018: 4-/5 rotational movement IR/ER, 4/5 for flexion and abduction; 03/10/2018: 4/5-4+/5; 06/07/2018: 4/5- 3/5 B; 07/12/2018: 4+/5 for flexion, 4/5 for abduction on L shoulder 08/11/2018: 4+/5 for both flexion/abduction8/31/2020: flexion 4/5 - pain; abduction 3+/5 pain; 10/20/2018: 4/5 for flexion/abduction - pain    Time  8    Period  Weeks    Status  On-going      PT LONG TERM GOAL #3   Title  Patient will  be independent with home program for strength and pain control for left UE/shoulder by 04/17/2015    Baseline  limited knowledge of pain control strategies or progression of exercises for left UE; 09/14/2017: Requires moderate cueing for exercise  progression; 10/28/2017: Continues to require minimal cueing to correct; 12/23/2017: continues to require moderate cueing for exercise performance; 02/07/2018: independent with HEP    Time  8    Period  Weeks    Status  Achieved      PT LONG TERM GOAL #4   Title  Patient will have a worst pain of a 3/10 to allow for performance of household activities without increased pain    Baseline  8/10; 12/23/2017: 8/10; 02/07/2018: 7/10 Both shoulder and LBP; 03/10/2018: 6/10; 06/07/2018: 10/10; 07/12/2018: 7/10; 08/11/2018: 7/10; 09/12/2018 - 6/10; 10/08 - 6/10    Time  8    Period  Weeks    Status  On-going      PT LONG TERM GOAL #5   Title  Patient will be able to stand for over 5 hours without increase in pain to allow for cooking for long periods of time wihtout increase in pain    Baseline  Increased pain after 4 hours from standing; 12/23/2017: Able to perform 5 hours of cooking with increased pain; 02/07/2018: Has not been cooking secondary to family difficulties; 03/10/2018: can cook 4 hours on cooking but has increased pain at after 4 hours; 06/07/2018: unable to test secondary to shoulder pain; 07/12/2018: reports she is able to perform with mild discomfort    Time  8    Period  Weeks    Status  Achieved      PT LONG TERM GOAL #6   Title  Patient will improve her MODI to under 48% to indicate significant improvement in lower back function and improvement with cooking in standing.    Baseline  58% MODI dysfunction; 03/10/2018: 52%: MODI dysfunction; 06/07/2018: will address NV; 07/11/18: 32%    Time  6    Period  Weeks    Status  Achieved      PT LONG TERM GOAL #7   Title  Patient will improve QuickDASH to under 49% to indicate significant improvement in L shoulder pain.    Baseline  59%; 09/12/2018: 56%; 10/20/2018: deffered until NV    Time  4    Period  Weeks    Status  Deferred            Plan - 11/02/18 1530    Clinical Impression Statement  Patient presented with >24 hrs without  sleep. Reinforced prior education on sleep and routinization. Treatment limited by pt lethargy but active movement overhead achieved with ball toss with minimal pain. Patient demonstrates good understanding and will benefit from further skilled therapy to return prior to level of function    Personal Factors and Comorbidities  Age;Behavior Pattern;Past/Current Experience    Examination-Activity Limitations  Carry;Sleep;Lift    Examination-Participation Restrictions  Community Activity    Stability/Clinical Decision Making  Stable/Uncomplicated    Rehab Potential  Fair    Clinical Impairments Affecting Rehab Potential  (+) highly motivated, (-) age, decreased strength    PT Frequency  2x / week    PT Duration  8 weeks    PT Treatment/Interventions  Passive range of motion;Manual techniques;Electrical Stimulation;Cryotherapy;Moist Heat;Ultrasound;Therapeutic exercise;Therapeutic activities;Neuromuscular re-education;Patient/family education;Scar mobilization;Dry needling;Iontophoresis 4mg /ml Dexamethasone    PT Next Visit Plan  progress AROM exercises    PT Home Exercise Plan  See education section    Consulted and Agree with Plan of Care  Patient       Patient will benefit from skilled therapeutic intervention in order to improve the following deficits and impairments:  Pain, Impaired sensation, Decreased coordination, Decreased mobility, Increased muscle spasms, Decreased activity tolerance, Decreased endurance, Decreased range of motion, Decreased strength  Visit Diagnosis: Stiffness of left shoulder, not elsewhere classified  Muscle weakness (generalized)  Chronic left shoulder pain     Problem List There are no active problems to display for this patient.   Janee MornRory Jillyan Plitt, SPT 11/02/2018, 3:46 PM  [place] [facility] [address] [place], [place], [id number] Phone: [phone]   Fax:  [phone]  Name: Henreitta LeberDeborah L  Geck MRN: [id number] Date of Birth: 1950-09-05

## 2018-11-09 ENCOUNTER — Ambulatory Visit: Payer: Medicare Other

## 2018-11-16 ENCOUNTER — Ambulatory Visit: Payer: Medicare Other

## 2018-11-17 ENCOUNTER — Ambulatory Visit: Payer: Medicare Other

## 2018-11-17 ENCOUNTER — Other Ambulatory Visit: Payer: Self-pay

## 2018-11-30 ENCOUNTER — Other Ambulatory Visit: Payer: Self-pay

## 2018-11-30 ENCOUNTER — Ambulatory Visit: Payer: Medicare Other | Attending: Orthopedic Surgery

## 2018-11-30 DIAGNOSIS — G8929 Other chronic pain: Secondary | ICD-10-CM | POA: Insufficient documentation

## 2018-11-30 DIAGNOSIS — M25612 Stiffness of left shoulder, not elsewhere classified: Secondary | ICD-10-CM | POA: Insufficient documentation

## 2018-11-30 DIAGNOSIS — M6281 Muscle weakness (generalized): Secondary | ICD-10-CM | POA: Insufficient documentation

## 2018-11-30 DIAGNOSIS — M25512 Pain in left shoulder: Secondary | ICD-10-CM | POA: Insufficient documentation

## 2018-11-30 NOTE — Therapy (Signed)
[place] [facility] SPORTS [facility]IC[facility] [address] [place], [address] Phone: [phone]   Fax:  [phone]  Patient Details  Name: Suzanne Snyder MRN: [id number] Date of Birth: 1950-07-24 Referring Provider:  Henry Russel, MD  Encounter Date: 11/30/2018  Patient arrived. Patient reports feeling like she has carpal tunnel/trigger finger and would like to get this checked out. Recommended several physicians in the [facility] area patient could see to address this issue.   Patient reports she continues to have increased shoulder bruising, but none currently. Reports she has not been in contact with her physician about this issue. Did not treat secondary to increased shoulder symptoms. Recommended patient go see physician before returning to physical therapy.     Blythe Stanford, PT DPT 11/30/2018, 1:52 PM  [place] [facility] SPORTS [facility]IC[facility] [address] [place], [address] Phone: [phone]   Fax:  [phone]

## 2018-12-28 NOTE — Therapy (Signed)
[place] [facility] [address] [place], [address] Phone: [phone]   Fax:  [phone]  Patient Details  Name: Suzanne Snyder MRN: [id number] Date of Birth: [birth year] Referring Provider:  Henry Russel, MD  Encounter Date: 11/17/2018   This was a cancelled visit and was entered in error.  Blythe Stanford, PT DPT 12/28/2018, 10:51 AM  [place] [facility] SPORTS MEDICINE [address] [place], [address] Phone: [phone]   Fax:  [phone]

## 2019-03-17 ENCOUNTER — Ambulatory Visit: Payer: TRICARE For Life (TFL) | Attending: Internal Medicine

## 2019-03-17 ENCOUNTER — Other Ambulatory Visit: Payer: Self-pay

## 2019-03-17 DIAGNOSIS — Z23 Encounter for immunization: Secondary | ICD-10-CM | POA: Insufficient documentation

## 2019-03-17 NOTE — Progress Notes (Signed)
   Covid-19 Vaccination Clinic  Name:  Suzanne Snyder    MRN: [id number] DOB: 1950/12/29  03/17/2019  [name] was observed post Covid-19 immunization for 15 minutes without incident. She was provided with Vaccine Information Sheet and instruction to access the V-Safe system.   [name] was instructed to call 911 with any severe reactions post vaccine: Marland Kitchen Difficulty breathing  . Swelling of face and throat  . A fast heartbeat  . A bad rash all over body  . Dizziness and weakness   Immunizations Administered    Name Date Dose VIS Date Route   Pfizer COVID-19 Vaccine 03/17/2019  9:51 AM 0.3 mL 12/23/2018 Intramuscular   Manufacturer: [employer], [employer]   Lot: [id number]   NDC: [address]-0

## 2019-04-06 ENCOUNTER — Ambulatory Visit: Payer: TRICARE For Life (TFL) | Attending: Internal Medicine

## 2019-04-06 DIAGNOSIS — Z23 Encounter for immunization: Secondary | ICD-10-CM

## 2019-04-06 NOTE — Progress Notes (Signed)
   Covid-19 Vaccination Clinic  Name:  Suzanne Snyder    MRN: [id number] DOB: [birth year]  04/06/2019  [name] was observed post Covid-19 immunization for 15 minutes without incident. She was provided with Vaccine Information Sheet and instruction to access the V-Safe system.   [name] was instructed to call 911 with any severe reactions post vaccine: Marland Kitchen Difficulty breathing  . Swelling of face and throat  . A fast heartbeat  . A bad rash all over body  . Dizziness and weakness   Immunizations Administered    Name Date Dose VIS Date Route   Pfizer COVID-19 Vaccine 04/06/2019 10:44 AM 0.3 mL 12/23/2018 Intramuscular   Manufacturer: [employer], [employer]   Lot: [id number]   NDC: [address]-5

## 2019-04-30 ENCOUNTER — Other Ambulatory Visit: Payer: Self-pay | Admitting: Dermatology

## 2019-08-27 ENCOUNTER — Other Ambulatory Visit: Payer: Self-pay | Admitting: Dermatology

## 2019-08-30 ENCOUNTER — Other Ambulatory Visit: Payer: Self-pay

## 2019-08-30 MED ORDER — TRIAMCINOLONE ACETONIDE 0.1 % EX CREA: TOPICAL_CREAM | CUTANEOUS | 0 refills | Status: DC

## 2019-08-30 NOTE — Progress Notes (Signed)
Clarifying RX to [email or web address]

## 2019-12-08 ENCOUNTER — Other Ambulatory Visit: Payer: Self-pay | Admitting: Dermatology

## 2020-02-08 ENCOUNTER — Other Ambulatory Visit: Payer: Self-pay | Admitting: Dermatology

## 2020-02-12 ENCOUNTER — Other Ambulatory Visit: Payer: Self-pay | Admitting: Dermatology

## 2020-02-12 ENCOUNTER — Telehealth: Payer: Self-pay

## 2020-02-12 NOTE — Telephone Encounter (Signed)
Pt called requesting a refill of Hydroxyzine tablets, 90 day supply, discussed with pt its been over a year since she was in the office she will have to return to the office before we could refill Hydroxyzine

## 2020-02-14 ENCOUNTER — Encounter: Payer: Self-pay | Admitting: Ophthalmology

## 2020-02-14 ENCOUNTER — Other Ambulatory Visit: Payer: Self-pay

## 2020-02-16 ENCOUNTER — Other Ambulatory Visit: Payer: Self-pay

## 2020-02-16 ENCOUNTER — Other Ambulatory Visit
Admission: RE | Admit: 2020-02-16 | Discharge: 2020-02-16 | Disposition: A | Discharge status: Home / Self Care | Payer: Medicare Other | Source: Ambulatory Visit | Attending: Ophthalmology | Admitting: Ophthalmology

## 2020-02-16 DIAGNOSIS — Z01812 Encounter for preprocedural laboratory examination: Secondary | ICD-10-CM | POA: Diagnosis present

## 2020-02-16 DIAGNOSIS — Z20822 Contact with and (suspected) exposure to covid-19: Secondary | ICD-10-CM | POA: Insufficient documentation

## 2020-02-16 LAB — SARS CORONAVIRUS 2 (TAT 6-24 HRS): SARS Coronavirus 2: NEGATIVE

## 2020-02-16 NOTE — Discharge Instructions (Signed)

## 2020-02-20 ENCOUNTER — Ambulatory Visit
Admission: RE | Admit: 2020-02-20 | Discharge: 2020-02-20 | Disposition: A | Discharge status: Home / Self Care | Payer: Medicare Other | Attending: Ophthalmology | Admitting: Ophthalmology

## 2020-02-20 ENCOUNTER — Ambulatory Visit: Payer: Medicare Other | Admitting: Anesthesiology

## 2020-02-20 ENCOUNTER — Encounter: Payer: Self-pay | Admitting: Ophthalmology

## 2020-02-20 ENCOUNTER — Encounter
Admission: RE | Disposition: A | Discharge status: Home / Self Care | Payer: Self-pay | Source: Home / Self Care | Attending: Ophthalmology

## 2020-02-20 ENCOUNTER — Other Ambulatory Visit: Payer: Self-pay

## 2020-02-20 DIAGNOSIS — J410 Simple chronic bronchitis: Secondary | ICD-10-CM | POA: Diagnosis not present

## 2020-02-20 DIAGNOSIS — E1136 Type 2 diabetes mellitus with diabetic cataract: Secondary | ICD-10-CM | POA: Diagnosis not present

## 2020-02-20 DIAGNOSIS — H2511 Age-related nuclear cataract, right eye: Secondary | ICD-10-CM | POA: Diagnosis present

## 2020-02-20 DIAGNOSIS — Z791 Long term (current) use of non-steroidal anti-inflammatories (NSAID): Secondary | ICD-10-CM | POA: Insufficient documentation

## 2020-02-20 DIAGNOSIS — Z7951 Long term (current) use of inhaled steroids: Secondary | ICD-10-CM | POA: Insufficient documentation

## 2020-02-20 DIAGNOSIS — Z794 Long term (current) use of insulin: Secondary | ICD-10-CM | POA: Diagnosis not present

## 2020-02-20 HISTORY — DX: Unspecified osteoarthritis, unspecified site: M19.90

## 2020-02-20 HISTORY — DX: Simple chronic bronchitis: J41.0

## 2020-02-20 HISTORY — DX: Presence of insulin pump (external) (internal): Z96.41

## 2020-02-20 HISTORY — DX: Unspecified asthma, uncomplicated: J45.909

## 2020-02-20 HISTORY — DX: Restless legs syndrome: G25.81

## 2020-02-20 HISTORY — PX: CATARACT EXTRACTION W/PHACO: SHX586

## 2020-02-20 HISTORY — DX: Presence of dental prosthetic device (complete) (partial): Z97.2

## 2020-02-20 LAB — GLUCOSE, CAPILLARY: Glucose-Capillary: 128 mg/dL — ABNORMAL HIGH (ref 70–99)

## 2020-02-20 SURGERY — PHACOEMULSIFICATION, CATARACT, WITH IOL INSERTION
Anesthesia: Monitor Anesthesia Care | Site: Eye | Laterality: Right

## 2020-02-20 MED ORDER — NA CHONDROIT SULF-NA HYALURON 40-17 MG/ML IO SOLN
INTRAOCULAR | Status: DC | PRN
  Administered 2020-02-20: 1 mL via INTRAOCULAR

## 2020-02-20 MED ORDER — MIDAZOLAM HCL 2 MG/2ML IJ SOLN
INTRAMUSCULAR | Status: DC | PRN
  Administered 2020-02-20: 1 mg via INTRAVENOUS

## 2020-02-20 MED ORDER — BRIMONIDINE TARTRATE-TIMOLOL 0.2-0.5 % OP SOLN
OPHTHALMIC | Status: DC | PRN
  Administered 2020-02-20: 1 [drp] via OPHTHALMIC

## 2020-02-20 MED ORDER — MOXIFLOXACIN HCL 0.5 % OP SOLN
OPHTHALMIC | Status: DC | PRN
  Administered 2020-02-20: 0.2 mL via OPHTHALMIC

## 2020-02-20 MED ORDER — FENTANYL CITRATE (PF) 100 MCG/2ML IJ SOLN
INTRAMUSCULAR | Status: DC | PRN
  Administered 2020-02-20: 50 ug via INTRAVENOUS

## 2020-02-20 MED ORDER — LIDOCAINE HCL (PF) 2 % IJ SOLN
INTRAOCULAR | Status: DC | PRN
  Administered 2020-02-20: 1 mL

## 2020-02-20 MED ORDER — EPINEPHRINE PF 1 MG/ML IJ SOLN
INTRAOCULAR | Status: DC | PRN
  Administered 2020-02-20: 70 mL via OPHTHALMIC

## 2020-02-20 MED ORDER — TETRACAINE HCL 0.5 % OP SOLN
1.0000 [drp] | OPHTHALMIC | Status: DC | PRN
  Administered 2020-02-20 (×3): 1 [drp] via OPHTHALMIC

## 2020-02-20 MED ORDER — ARMC OPHTHALMIC DILATING DROPS
1.0000 "application " | OPHTHALMIC | Status: DC | PRN
  Administered 2020-02-20 (×3): 1 via OPHTHALMIC

## 2020-02-20 SURGICAL SUPPLY — 19 items
CANNULA ANT/CHMB 27G (MISCELLANEOUS) ×2 IMPLANT
CANNULA ANT/CHMB 27GA (MISCELLANEOUS) ×4 IMPLANT
GLOVE SURG LX 8.0 MICRO (GLOVE) ×1
GLOVE SURG LX STRL 8.0 MICRO (GLOVE) ×1 IMPLANT
GLOVE SURG TRIUMPH 8.0 PF LTX (GLOVE) ×2 IMPLANT
GOWN STRL REUS W/ TWL LRG LVL3 (GOWN DISPOSABLE) ×2 IMPLANT
GOWN STRL REUS W/TWL LRG LVL3 (GOWN DISPOSABLE) ×4
LENS IOL TECNIS EYHANCE 17.0 (Intraocular Lens) ×1 IMPLANT
MARKER SKIN DUAL TIP RULER LAB (MISCELLANEOUS) ×2 IMPLANT
NDL FILTER BLUNT 18X1 1/2 (NEEDLE) ×1 IMPLANT
NEEDLE FILTER BLUNT 18X 1/2SAF (NEEDLE) ×1
NEEDLE FILTER BLUNT 18X1 1/2 (NEEDLE) ×1 IMPLANT
PACK EYE AFTER SURG (MISCELLANEOUS) ×2 IMPLANT
PACK OPTHALMIC (MISCELLANEOUS) ×2 IMPLANT
PACK PORFILIO (MISCELLANEOUS) ×2 IMPLANT
SYR 3ML LL SCALE MARK (SYRINGE) ×2 IMPLANT
SYR TB 1ML LUER SLIP (SYRINGE) ×2 IMPLANT
WATER STERILE IRR 250ML POUR (IV SOLUTION) ×2 IMPLANT
WIPE NON LINTING 3.25X3.25 (MISCELLANEOUS) ×2 IMPLANT

## 2020-02-20 NOTE — Anesthesia Preprocedure Evaluation (Signed)
Anesthesia Evaluation   Patient awake    Reviewed: Allergy & Precautions, H&P , NPO status , Patient's Chart, lab work & pertinent test results  Airway Mallampati: II  TM Distance: >3 FB Neck ROM: full    Dental no notable dental hx.    Pulmonary asthma , Current Smoker and Patient abstained from smoking.,    Pulmonary exam normal        Cardiovascular negative cardio ROS Normal cardiovascular exam Rhythm:regular Rate:Normal     Neuro/Psych Anxiety negative neurological ROS     GI/Hepatic   Endo/Other  diabetes, Well Controlled, Type 2, Insulin Dependent  Renal/GU   negative genitourinary   Musculoskeletal   Abdominal   Peds  Hematology   Anesthesia Other Findings   Reproductive/Obstetrics                             Anesthesia Physical Anesthesia Plan  ASA: II  Anesthesia Plan: MAC   Post-op Pain Management:    Induction:   PONV Risk Score and Plan:   Airway Management Planned:   Additional Equipment:   Intra-op Plan:   Post-operative Plan:   Informed Consent: I have reviewed the patients History and Physical, chart, labs and discussed the procedure including the risks, benefits and alternatives for the proposed anesthesia with the patient or authorized representative who has indicated his/her understanding and acceptance.       Plan Discussed with:   Anesthesia Plan Comments:         Anesthesia Quick Evaluation

## 2020-02-20 NOTE — Anesthesia Postprocedure Evaluation (Signed)
Anesthesia Post Note  Patient: Suzanne Snyder  Procedure(s) Performed: CATARACT EXTRACTION PHACO AND INTRAOCULAR LENS PLACEMENT (IOC) RIGHT 5.60 00:36.3 (Right Eye)     Patient location during evaluation: PACU Anesthesia Type: MAC Level of consciousness: awake and alert Pain management: pain level controlled Vital Signs Assessment: post-procedure vital signs reviewed and stable Respiratory status: spontaneous breathing Cardiovascular status: stable Anesthetic complications: no   No complications documented.  Gillian Scarce

## 2020-02-20 NOTE — Op Note (Signed)
PREOPERATIVE DIAGNOSIS:  Nuclear sclerotic cataract of the right eye.   POSTOPERATIVE DIAGNOSIS:  H25.041 Cataract   OPERATIVE PROCEDURE:@   SURGEON:  Galen Manila, MD.   ANESTHESIA:  Anesthesiologist: Jolayne Panther, MD CRNA: Jinny Blossom, CRNA  1.      Managed anesthesia care. 2.      0.50ml of Shugarcaine was instilled in the eye following the paracentesis.   COMPLICATIONS:  None.   TECHNIQUE:   Stop and chop   DESCRIPTION OF PROCEDURE:  The patient was examined and consented in the preoperative holding area where the aforementioned topical anesthesia was applied to the right eye and then brought back to the Operating Room where the right eye was prepped and draped in the usual sterile ophthalmic fashion and a lid speculum was placed. A paracentesis was created with the side port blade and the anterior chamber was filled with viscoelastic. A near clear corneal incision was performed with the steel keratome. A continuous curvilinear capsulorrhexis was performed with a cystotome followed by the capsulorrhexis forceps. Hydrodissection and hydrodelineation were carried out with BSS on a blunt cannula. The lens was removed in a stop and chop  technique and the remaining cortical material was removed with the irrigation-aspiration handpiece. The capsular bag was inflated with viscoelastic and the Technis [id number]  lens was placed in the capsular bag without complication. The remaining viscoelastic was removed from the eye with the irrigation-aspiration handpiece. The wounds were hydrated. The anterior chamber was flushed with BSS and the eye was inflated to physiologic pressure. 0.36ml of Vigamox was placed in the anterior chamber. The wounds were found to be water tight. The eye was dressed with Combigan. The patient was given protective glasses to wear throughout the day and a shield with which to sleep tonight. The patient was also given drops with which to begin a drop regimen today and  will follow-up with me in one day. Implant Name Type Inv. Item Serial No. Manufacturer Lot No. LRB No. Used Action  LENS IOL TECNIS EYHANCE 17.0 - [id number] Intraocular Lens LENS IOL TECNIS EYHANCE 17.0 [id number] JOHNSON   Right 1 Implanted   Procedure(s) with comments: CATARACT EXTRACTION PHACO AND INTRAOCULAR LENS PLACEMENT (IOC) RIGHT 5.60 00:36.3 (Right) - Diabetic - insulin pump Requests not first  Electronically signed: Galen Manila 02/20/2020 8:41 AM

## 2020-02-20 NOTE — Progress Notes (Signed)
No risk

## 2020-02-20 NOTE — Transfer of Care (Signed)
Immediate Anesthesia Transfer of Care Note  Patient: Suzanne Snyder  Procedure(s) Performed: CATARACT EXTRACTION PHACO AND INTRAOCULAR LENS PLACEMENT (IOC) RIGHT 5.60 00:36.3 (Right Eye)  Patient Location: PACU  Anesthesia Type: MAC  Level of Consciousness: awake, alert  and patient cooperative  Airway and Oxygen Therapy: Patient Spontanous Breathing and Patient connected to supplemental oxygen  Post-op Assessment: Post-op Vital signs reviewed, Patient's Cardiovascular Status Stable, Respiratory Function Stable, Patent Airway and No signs of Nausea or vomiting  Post-op Vital Signs: Reviewed and stable  Complications: No complications documented.

## 2020-02-20 NOTE — H&P (Signed)
[facility]   Primary Care Physician:  Katrinka Blazing, MD Ophthalmologist: Dr. Druscilla Brownie  Pre-Procedure History & Physical: HPI:  Suzanne Snyder is a 70 y.o. female here for cataract surgery.   Past Medical History:  Diagnosis Date  . Anxiety   . Arthritis   . Asthma   . Diabetes mellitus without complication (HCC)   . Insulin pump in place   . Osteoporosis   . RLS (restless legs syndrome)   . Smokers' cough (HCC)   . Wears dentures    full upper and lower    Past Surgical History:  Procedure Laterality Date  . 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 ROTATOR CUFF REPAIR Left 2014 and 2015  . SPINE SURGERY N/A 1984   L4-5, not sure of procedure  . THYROID SURGERY      Prior to Admission medications   Medication Sig Start Date End Date Taking? Authorizing Provider  albuterol (VENTOLIN HFA) 108 (90 Base) MCG/ACT inhaler Inhale into the lungs every 6 (six) hours as needed for wheezing or shortness of breath.   Yes [provider]  ammonium lactate (AMLACTIN) 12 % cream Apply topically as needed for dry skin.   Yes [provider]  celecoxib (CELEBREX) 100 MG capsule Take 100 mg by mouth 2 (two) times daily.   Yes [provider]  clonazePAM (KLONOPIN) 0.5 MG tablet Take 0.5 mg by mouth 2 (two) times daily as needed for anxiety.   Yes [provider]  cyclobenzaprine (FLEXERIL) 10 MG tablet Take 10 mg by mouth 3 (three) times daily as needed for muscle spasms.   Yes [provider]  diphenhydrAMINE HCl (BENADRYL ALLERGY PO) Take 50 mg by mouth as needed.   Yes [provider]  Fluticasone-Salmeterol (ADVAIR) 500-50 MCG/DOSE AEPB Inhale 1 puff into the lungs daily.   Yes [provider]  gabapentin (NEURONTIN) 300 MG capsule Take 300 mg by mouth 4 (four) times daily.   Yes [provider]  insulin aspart (NOVOLOG) 100 UNIT/ML injection Inject into the skin 4 (four) times daily as needed for high  blood sugar (with meals and at bedtime).   Yes [provider]  ipratropium (ATROVENT) 0.02 % nebulizer solution Take 0.5 mg by nebulization every 6 (six) hours as needed for wheezing or shortness of breath.   Yes [provider]  lidocaine (LIDODERM) 5 % Place 1 patch onto the skin daily as needed. Remove & Discard patch within 12 hours or as directed by MD   Yes [provider]  montelukast (SINGULAIR) 10 MG tablet Take 10 mg by mouth at bedtime.   Yes [provider]  Multiple Vitamin (MULTIVITAMIN ADULT PO) Take by mouth 2 (two) times daily.   Yes [provider]  rOPINIRole (REQUIP) 1 MG tablet Take 1 mg by mouth daily.   Yes [provider]  Vitamin D, Ergocalciferol, (DRISDOL) 50000 UNITS CAPS capsule Take 50,000 Units by mouth 2 (two) times a week.   Yes [provider]  hydrOXYzine (ATARAX/VISTARIL) 10 MG tablet TAKE 1 OR 2 TABLET BY MOUTH THREE TIMES DAILY AS NEEDED FOR [facility] Patient not taking: Reported on 02/14/2020 05/01/19   Willeen Niece, MD  tapentadol (NUCYNTA) 50 MG TABS tablet Take 50 mg by mouth. Patient not taking: Reported on 02/14/2020    [provider]  triamcinolone cream (KENALOG) 0.1 % Apply a small amount to affected area 1 to 2 times daily as needed for itchy  areas on back, Avoid face, groin, and underarms. NEEDS APPT FOR FURTHER REFILLS 08/30/19   Willeen Niece, MD    Allergies as of 12/29/2019 - Review Complete 11/30/2018  Allergen Reaction Noted  . Erythromycin Other (See Comments) 06/12/2014    History reviewed. No pertinent family history.  Social History   Socioeconomic History  . Marital status: Married    Spouse name: Not on file  . Number of children: Not on file  . Years of education: Not on file  . Highest education level: Not on file  Occupational History  . Not on file  Tobacco Use  . Smoking status: Current Every Day Smoker    Packs/day: 1.00    Years: 50.00    Pack years:  50.00    Types: Cigarettes  . Smokeless tobacco: Never Used  . Tobacco comment: started smoking age 32  Vaping Use  . Vaping Use: Former  . Devices: "tried it"  Substance and Sexual Activity  . Alcohol use: No  . Drug use: No  . Sexual activity: Not on file  Other Topics Concern  . Not on file  Social History Narrative  . Not on file   Social Determinants of Health   Financial Resource Strain: Not on file  Food Insecurity: Not on file  Transportation Needs: Not on file  Physical Activity: Not on file  Stress: Not on file  Social Connections: Not on file  Intimate Partner Violence: Not on file    Review of Systems: See HPI, otherwise negative ROS  Physical Exam: BP (!) 129/56   Pulse 68   Temp (!) 97.2 F (36.2 C) (Temporal)   Resp 16   Ht 5\' 7"  (1.702 m)   Wt 53.1 kg   SpO2 98%   BMI 18.32 kg/m  General:   Alert,  pleasant and cooperative in NAD Head:  Normocephalic and atraumatic. Respiratory:  Normal work of breathing.  Impression/Plan: is here for cataract surgery.  Risks, benefits, limitations, and alternatives regarding cataract surgery have been reviewed with the patient.  Questions have been answered.  All parties agreeable.   [name], MD  02/20/2020, 8:15 AM

## 2020-02-20 NOTE — Anesthesia Procedure Notes (Signed)
Procedure Name: MAC Date/Time: 02/20/2020 8:24 AM Performed by: Jeannene Patella, CRNA Pre-anesthesia Checklist: Patient identified, Emergency Drugs available, Suction available, Timeout performed and Patient being monitored Patient Re-evaluated:Patient Re-evaluated prior to induction Oxygen Delivery Method: Nasal cannula Placement Confirmation: positive ETCO2

## 2020-02-21 ENCOUNTER — Encounter: Payer: Self-pay | Admitting: Ophthalmology

## 2020-02-27 ENCOUNTER — Encounter: Payer: Self-pay | Admitting: Ophthalmology

## 2020-02-27 ENCOUNTER — Other Ambulatory Visit: Payer: Self-pay

## 2020-03-01 ENCOUNTER — Other Ambulatory Visit
Admission: RE | Admit: 2020-03-01 | Discharge: 2020-03-01 | Disposition: A | Discharge status: Home / Self Care | Payer: Medicare Other | Source: Ambulatory Visit | Attending: Ophthalmology | Admitting: Ophthalmology

## 2020-03-01 ENCOUNTER — Other Ambulatory Visit: Payer: Self-pay

## 2020-03-01 DIAGNOSIS — Z01812 Encounter for preprocedural laboratory examination: Secondary | ICD-10-CM | POA: Insufficient documentation

## 2020-03-01 DIAGNOSIS — Z20822 Contact with and (suspected) exposure to covid-19: Secondary | ICD-10-CM | POA: Insufficient documentation

## 2020-03-01 LAB — SARS CORONAVIRUS 2 (TAT 6-24 HRS): SARS Coronavirus 2: NEGATIVE

## 2020-03-01 NOTE — Discharge Instructions (Signed)

## 2020-03-05 ENCOUNTER — Encounter: Payer: Self-pay | Admitting: Ophthalmology

## 2020-03-05 ENCOUNTER — Ambulatory Visit
Admission: RE | Admit: 2020-03-05 | Discharge: 2020-03-05 | Disposition: A | Discharge status: Home / Self Care | Payer: Medicare Other | Attending: Ophthalmology | Admitting: Ophthalmology

## 2020-03-05 ENCOUNTER — Ambulatory Visit: Payer: Medicare Other | Admitting: Anesthesiology

## 2020-03-05 ENCOUNTER — Other Ambulatory Visit: Payer: Self-pay

## 2020-03-05 ENCOUNTER — Encounter
Admission: RE | Disposition: A | Discharge status: Home / Self Care | Payer: Self-pay | Source: Home / Self Care | Attending: Ophthalmology

## 2020-03-05 DIAGNOSIS — Z79899 Other long term (current) drug therapy: Secondary | ICD-10-CM | POA: Diagnosis not present

## 2020-03-05 DIAGNOSIS — E1136 Type 2 diabetes mellitus with diabetic cataract: Secondary | ICD-10-CM | POA: Insufficient documentation

## 2020-03-05 DIAGNOSIS — Z7951 Long term (current) use of inhaled steroids: Secondary | ICD-10-CM | POA: Diagnosis not present

## 2020-03-05 DIAGNOSIS — Z794 Long term (current) use of insulin: Secondary | ICD-10-CM | POA: Insufficient documentation

## 2020-03-05 DIAGNOSIS — Z791 Long term (current) use of non-steroidal anti-inflammatories (NSAID): Secondary | ICD-10-CM | POA: Insufficient documentation

## 2020-03-05 DIAGNOSIS — Z881 Allergy status to other antibiotic agents status: Secondary | ICD-10-CM | POA: Insufficient documentation

## 2020-03-05 DIAGNOSIS — H2512 Age-related nuclear cataract, left eye: Secondary | ICD-10-CM | POA: Insufficient documentation

## 2020-03-05 DIAGNOSIS — Z9641 Presence of insulin pump (external) (internal): Secondary | ICD-10-CM | POA: Diagnosis not present

## 2020-03-05 DIAGNOSIS — Z96642 Presence of left artificial hip joint: Secondary | ICD-10-CM | POA: Insufficient documentation

## 2020-03-05 DIAGNOSIS — F17210 Nicotine dependence, cigarettes, uncomplicated: Secondary | ICD-10-CM | POA: Diagnosis not present

## 2020-03-05 HISTORY — PX: CATARACT EXTRACTION W/PHACO: SHX586

## 2020-03-05 LAB — GLUCOSE, CAPILLARY
Glucose-Capillary: 79 mg/dL (ref 70–99)
Glucose-Capillary: 113 mg/dL — ABNORMAL HIGH (ref 70–99)
Glucose-Capillary: 67 mg/dL — ABNORMAL LOW (ref 70–99)
Glucose-Capillary: 67 mg/dL — ABNORMAL LOW (ref 70–99)

## 2020-03-05 SURGERY — PHACOEMULSIFICATION, CATARACT, WITH IOL INSERTION
Anesthesia: Monitor Anesthesia Care | Site: Eye | Laterality: Left

## 2020-03-05 MED ORDER — NA CHONDROIT SULF-NA HYALURON 40-17 MG/ML IO SOLN
INTRAOCULAR | Status: DC | PRN
  Administered 2020-03-05: 1 mL via INTRAOCULAR

## 2020-03-05 MED ORDER — BRIMONIDINE TARTRATE-TIMOLOL 0.2-0.5 % OP SOLN
OPHTHALMIC | Status: DC | PRN
  Administered 2020-03-05: 1 [drp] via OPHTHALMIC

## 2020-03-05 MED ORDER — TETRACAINE HCL 0.5 % OP SOLN
1.0000 [drp] | OPHTHALMIC | Status: DC | PRN
  Administered 2020-03-05 (×3): 1 [drp] via OPHTHALMIC

## 2020-03-05 MED ORDER — MIDAZOLAM HCL 2 MG/2ML IJ SOLN
INTRAMUSCULAR | Status: DC | PRN
  Administered 2020-03-05: 1 mg via INTRAVENOUS

## 2020-03-05 MED ORDER — ARMC OPHTHALMIC DILATING DROPS
1.0000 "application " | OPHTHALMIC | Status: DC | PRN
  Administered 2020-03-05 (×3): 1 via OPHTHALMIC

## 2020-03-05 MED ORDER — LIDOCAINE HCL (PF) 2 % IJ SOLN
INTRAOCULAR | Status: DC | PRN
  Administered 2020-03-05: 1 mL

## 2020-03-05 MED ORDER — EPINEPHRINE PF 1 MG/ML IJ SOLN
INTRAOCULAR | Status: DC | PRN
  Administered 2020-03-05: 56 mL via OPHTHALMIC

## 2020-03-05 MED ORDER — LACTATED RINGERS IV SOLN: INTRAVENOUS | Status: DC

## 2020-03-05 MED ORDER — FENTANYL CITRATE (PF) 100 MCG/2ML IJ SOLN
INTRAMUSCULAR | Status: DC | PRN
  Administered 2020-03-05: 50 ug via INTRAVENOUS

## 2020-03-05 MED ORDER — MOXIFLOXACIN HCL 0.5 % OP SOLN
OPHTHALMIC | Status: DC | PRN
  Administered 2020-03-05: 0.2 mL via OPHTHALMIC

## 2020-03-05 MED ORDER — DEXTROSE 50 % IV SOLN
12.5000 g | Freq: Once | INTRAVENOUS | Status: AC
  Administered 2020-03-05: 12.5 g via INTRAVENOUS

## 2020-03-05 SURGICAL SUPPLY — 19 items
CANNULA ANT/CHMB 27G (MISCELLANEOUS) ×2 IMPLANT
CANNULA ANT/CHMB 27GA (MISCELLANEOUS) ×4 IMPLANT
GLOVE SURG LX 8.0 MICRO (GLOVE) ×1
GLOVE SURG LX STRL 8.0 MICRO (GLOVE) ×1 IMPLANT
GLOVE SURG TRIUMPH 8.0 PF LTX (GLOVE) ×2 IMPLANT
GOWN STRL REUS W/ TWL LRG LVL3 (GOWN DISPOSABLE) ×2 IMPLANT
GOWN STRL REUS W/TWL LRG LVL3 (GOWN DISPOSABLE) ×4
LENS IOL TECNIS EYHANCE 18.0 (Intraocular Lens) ×1 IMPLANT
MARKER SKIN DUAL TIP RULER LAB (MISCELLANEOUS) ×2 IMPLANT
NDL FILTER BLUNT 18X1 1/2 (NEEDLE) ×1 IMPLANT
NEEDLE FILTER BLUNT 18X 1/2SAF (NEEDLE) ×1
NEEDLE FILTER BLUNT 18X1 1/2 (NEEDLE) ×1 IMPLANT
PACK EYE AFTER SURG (MISCELLANEOUS) ×2 IMPLANT
PACK OPTHALMIC (MISCELLANEOUS) ×2 IMPLANT
PACK PORFILIO (MISCELLANEOUS) ×2 IMPLANT
SYR 3ML LL SCALE MARK (SYRINGE) ×2 IMPLANT
SYR TB 1ML LUER SLIP (SYRINGE) ×2 IMPLANT
WATER STERILE IRR 250ML POUR (IV SOLUTION) ×2 IMPLANT
WIPE NON LINTING 3.25X3.25 (MISCELLANEOUS) ×2 IMPLANT

## 2020-03-05 NOTE — Transfer of Care (Signed)
Immediate Anesthesia Transfer of Care Note  Patient: Suzanne Snyder  Procedure(s) Performed: CATARACT EXTRACTION PHACO AND INTRAOCULAR LENS PLACEMENT (IOC) LEFT 4.94 00:31.6 (Left Eye)  Patient Location: PACU  Anesthesia Type: MAC  Level of Consciousness: awake, alert  and patient cooperative  Airway and Oxygen Therapy: Patient Spontanous Breathing and Patient connected to supplemental oxygen  Post-op Assessment: Post-op Vital signs reviewed, Patient's Cardiovascular Status Stable, Respiratory Function Stable, Patent Airway and No signs of Nausea or vomiting  Post-op Vital Signs: Reviewed and stable  Complications: No complications documented.

## 2020-03-05 NOTE — Anesthesia Procedure Notes (Signed)
Procedure Name: MAC Date/Time: 03/05/2020 10:18 AM Performed by: Cameron Ali, CRNA Pre-anesthesia Checklist: Patient identified, Emergency Drugs available, Suction available, Timeout performed and Patient being monitored Patient Re-evaluated:Patient Re-evaluated prior to induction Oxygen Delivery Method: Nasal cannula Placement Confirmation: positive ETCO2

## 2020-03-05 NOTE — H&P (Signed)
[facility]   Primary Care Physician:  Katrinka Blazing, MD Ophthalmologist: Dr. Druscilla Brownie  Pre-Procedure History & Physical: HPI:  Suzanne Snyder is a 70 y.o. female here for cataract surgery.   Past Medical History:  Diagnosis Date  . Anxiety   . Arthritis   . Asthma   . Diabetes mellitus without complication (HCC)   . Insulin pump in place   . Osteoporosis   . RLS (restless legs syndrome)   . Smokers' cough (HCC)   . Wears dentures    full upper and lower    Past Surgical History:  Procedure Laterality Date  . CATARACT EXTRACTION W/PHACO Right 02/20/2020   Procedure: CATARACT EXTRACTION PHACO AND INTRAOCULAR LENS PLACEMENT (IOC) RIGHT 5.60 00:36.3;  Surgeon: Galen Manila, MD;  Location: [facility];  Service: Ophthalmology;  Laterality: Right;  Diabetic - insulin pump Requests not first  . 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 ROTATOR CUFF REPAIR Left 2014 and 2015  . SPINE SURGERY N/A 1984   L4-5, not sure of procedure  . THYROID SURGERY      Prior to Admission medications   Medication Sig Start Date End Date Taking? Authorizing Provider  acetaminophen (TYLENOL) 325 MG tablet Take 650 mg by mouth every 6 (six) hours as needed.   Yes [provider]  albuterol (VENTOLIN HFA) 108 (90 Base) MCG/ACT inhaler Inhale into the lungs every 6 (six) hours as needed for wheezing or shortness of breath.   Yes [provider]  ammonium lactate (AMLACTIN) 12 % cream Apply topically as needed for dry skin.   Yes [provider]  celecoxib (CELEBREX) 100 MG capsule Take 100 mg by mouth 2 (two) times daily.   Yes [provider]  clonazePAM (KLONOPIN) 0.5 MG tablet Take 0.5 mg by mouth 2 (two) times daily as needed for anxiety.   Yes [provider]  diphenhydrAMINE HCl (BENADRYL ALLERGY PO) Take 50 mg by mouth as needed.   Yes [provider]  Fluticasone-Salmeterol (ADVAIR) 500-50 MCG/DOSE AEPB Inhale 1 puff  into the lungs daily.   Yes [provider]  gabapentin (NEURONTIN) 300 MG capsule Take 300 mg by mouth 4 (four) times daily.   Yes [provider]  insulin aspart (NOVOLOG) 100 UNIT/ML injection Inject into the skin 4 (four) times daily as needed for high blood sugar (with meals and at bedtime).   Yes [provider]  ipratropium (ATROVENT) 0.02 % nebulizer solution Take 0.5 mg by nebulization every 6 (six) hours as needed for wheezing or shortness of breath.   Yes [provider]  montelukast (SINGULAIR) 10 MG tablet Take 10 mg by mouth at bedtime.   Yes [provider]  Multiple Vitamin (MULTIVITAMIN ADULT PO) Take by mouth 2 (two) times daily.   Yes [provider]  rOPINIRole (REQUIP) 1 MG tablet Take 1 mg by mouth daily.   Yes [provider]  tapentadol (NUCYNTA) 50 MG TABS tablet Take 50 mg by mouth.   Yes [provider]  triamcinolone cream (KENALOG) 0.1 % Apply a small amount to affected area 1 to 2 times daily as needed for itchy areas on back, Avoid face, groin, and underarms. NEEDS APPT FOR FURTHER REFILLS 08/30/19  Yes Willeen Niece, MD  Vitamin D, Ergocalciferol, (DRISDOL) 50000 UNITS CAPS capsule Take 50,000 Units by mouth 2 (two) times a week.   Yes [provider]  cyclobenzaprine (FLEXERIL) 10 MG tablet Take 10 mg  by mouth 3 (three) times daily as needed for muscle spasms.    [provider]  hydrOXYzine (ATARAX/VISTARIL) 10 MG tablet TAKE 1 OR 2 TABLET BY MOUTH THREE TIMES DAILY AS NEEDED FOR [facility] Patient not taking: Reported on 02/14/2020 05/01/19   Willeen Niece, MD  lidocaine (LIDODERM) 5 % Place 1 patch onto the skin daily as needed. Remove & Discard patch within 12 hours or as directed by MD    [provider]    Allergies as of 12/29/2019 - Review Complete 11/30/2018  Allergen Reaction Noted  . Erythromycin Other (See Comments) 06/12/2014    History reviewed. No pertinent  family history.  Social History   Socioeconomic History  . Marital status: Married    Spouse name: Not on file  . Number of children: Not on file  . Years of education: Not on file  . Highest education level: Not on file  Occupational History  . Not on file  Tobacco Use  . Smoking status: Current Every Day Smoker    Packs/day: 1.00    Years: 50.00    Pack years: 50.00    Types: Cigarettes  . Smokeless tobacco: Never Used  . Tobacco comment: started smoking age 89  Vaping Use  . Vaping Use: Former  . Devices: "tried it"  Substance and Sexual Activity  . Alcohol use: No  . Drug use: No  . Sexual activity: Not on file  Other Topics Concern  . Not on file  Social History Narrative  . Not on file   Social Determinants of Health   Financial Resource Strain: Not on file  Food Insecurity: Not on file  Transportation Needs: Not on file  Physical Activity: Not on file  Stress: Not on file  Social Connections: Not on file  Intimate Partner Violence: Not on file    Review of Systems: See HPI, otherwise negative ROS  Physical Exam: BP (!) 117/59   Pulse 70   Temp (!) 97.4 F (36.3 C) (Temporal)   Ht 5\' 7"  (1.702 m)   Wt 55.8 kg   SpO2 98%   BMI 19.26 kg/m  General:   Alert,  pleasant and cooperative in NAD Head:  Normocephalic and atraumatic. Respiratory:  Normal work of breathing.  Impression/Plan: is here for cataract surgery.  Risks, benefits, limitations, and alternatives regarding cataract surgery have been reviewed with the patient.  Questions have been answered.  All parties agreeable.   [name], MD  03/05/2020, 10:06 AM

## 2020-03-05 NOTE — Anesthesia Preprocedure Evaluation (Addendum)
Anesthesia Evaluation  Patient identified by MRN, date of birth, ID band Patient awake    Reviewed: Allergy & Precautions, H&P , NPO status , Patient's Chart, lab work & pertinent test results  Airway Mallampati: II  TM Distance: >3 FB Neck ROM: full    Dental  (+) Upper Dentures, Lower Dentures   Pulmonary asthma , COPD, Current Smoker and Patient abstained from smoking.,    Pulmonary exam normal        Cardiovascular negative cardio ROS Normal cardiovascular exam Rhythm:regular Rate:Normal     Neuro/Psych Anxiety negative neurological ROS     GI/Hepatic   Endo/Other  diabetes (insulin pump on left thigh. BG 79 in preop. Patients denies symptoms of hypoglycemia), Well Controlled, Insulin Dependent  Renal/GU   negative genitourinary   Musculoskeletal  (+) Arthritis ,   Abdominal Normal abdominal exam  (+)   Peds  Hematology   Anesthesia Other Findings   Reproductive/Obstetrics                            Anesthesia Physical  Anesthesia Plan  ASA: III  Anesthesia Plan: MAC   Post-op Pain Management:    Induction:   PONV Risk Score and Plan: 1 and TIVA, Midazolam and Treatment may vary due to age or medical condition  Airway Management Planned: Natural Airway  Additional Equipment: None  Intra-op Plan:   Post-operative Plan:   Informed Consent: I have reviewed the patients History and Physical, chart, labs and discussed the procedure including the risks, benefits and alternatives for the proposed anesthesia with the patient or authorized representative who has indicated his/her understanding and acceptance.     Dental advisory given  Plan Discussed with: CRNA  Anesthesia Plan Comments:         Anesthesia Quick Evaluation

## 2020-03-05 NOTE — Op Note (Signed)
PREOPERATIVE DIAGNOSIS:  Nuclear sclerotic cataract of the left eye.   POSTOPERATIVE DIAGNOSIS:  Nuclear sclerotic cataract of the left eye.   OPERATIVE PROCEDURE:@   SURGEON:  Galen Manila, MD.   ANESTHESIA:  Anesthesiologist: Fletcher Anon, MD CRNA: Maree Krabbe, CRNA  1.      Managed anesthesia care. 2.     0.24ml of Shugarcaine was instilled following the paracentesis   COMPLICATIONS:  None.   TECHNIQUE:   Stop and chop   DESCRIPTION OF PROCEDURE:  The patient was examined and consented in the preoperative holding area where the aforementioned topical anesthesia was applied to the left eye and then brought back to the Operating Room where the left eye was prepped and draped in the usual sterile ophthalmic fashion and a lid speculum was placed. A paracentesis was created with the side port blade and the anterior chamber was filled with viscoelastic. A near clear corneal incision was performed with the steel keratome. A continuous curvilinear capsulorrhexis was performed with a cystotome followed by the capsulorrhexis forceps. Hydrodissection and hydrodelineation were carried out with BSS on a blunt cannula. The lens was removed in a stop and chop  technique and the remaining cortical material was removed with the irrigation-aspiration handpiece. The capsular bag was inflated with viscoelastic and the Technis [id number] lens was placed in the capsular bag without complication. The remaining viscoelastic was removed from the eye with the irrigation-aspiration handpiece. The wounds were hydrated. The anterior chamber was flushed with BSS and the eye was inflated to physiologic pressure. 0.40ml Vigamox was placed in the anterior chamber. The wounds were found to be water tight. The eye was dressed with Combigan. The patient was given protective glasses to wear throughout the day and a shield with which to sleep tonight. The patient was also given drops with which to begin a drop regimen today and will  follow-up with me in one day. Implant Name Type Inv. Item Serial No. Manufacturer Lot No. LRB No. Used Action  LENS IOL TECNIS EYHANCE 18.0 - [id number] Intraocular Lens LENS IOL TECNIS EYHANCE 18.0 [id number] JOHNSON   Left 1 Implanted    Procedure(s): CATARACT EXTRACTION PHACO AND INTRAOCULAR LENS PLACEMENT (IOC) LEFT 4.94 00:31.6 (Left)  Electronically signed: Galen Manila 03/05/2020 10:32 AM

## 2020-03-05 NOTE — Anesthesia Postprocedure Evaluation (Signed)
Anesthesia Post Note  Patient: Suzanne Snyder  Procedure(s) Performed: CATARACT EXTRACTION PHACO AND INTRAOCULAR LENS PLACEMENT (IOC) LEFT 4.94 00:31.6 (Left Eye)     Patient location during evaluation: PACU Anesthesia Type: MAC Level of consciousness: awake and alert Pain management: pain level controlled Vital Signs Assessment: post-procedure vital signs reviewed and stable Respiratory status: spontaneous breathing and nonlabored ventilation Cardiovascular status: blood pressure returned to baseline Postop Assessment: no apparent nausea or vomiting Anesthetic complications: no Comments: Patient given D50 via IV and drank juice in PACU for BG in 60's. Patient denied symptoms of hypoglycemia. BG 113 prior to discharge.   No complications documented.  Barry Faircloth [employer]

## 2020-03-06 ENCOUNTER — Encounter: Payer: Self-pay | Admitting: Ophthalmology

## 2020-03-19 ENCOUNTER — Ambulatory Visit (INDEPENDENT_AMBULATORY_CARE_PROVIDER_SITE_OTHER): Payer: Medicare Other | Admitting: Dermatology

## 2020-03-19 ENCOUNTER — Other Ambulatory Visit: Payer: Self-pay

## 2020-03-19 DIAGNOSIS — B353 Tinea pedis: Secondary | ICD-10-CM | POA: Diagnosis not present

## 2020-03-19 DIAGNOSIS — L821 Other seborrheic keratosis: Secondary | ICD-10-CM | POA: Diagnosis not present

## 2020-03-19 DIAGNOSIS — L820 Inflamed seborrheic keratosis: Secondary | ICD-10-CM | POA: Diagnosis not present

## 2020-03-19 DIAGNOSIS — L309 Dermatitis, unspecified: Secondary | ICD-10-CM

## 2020-03-19 MED ORDER — AMMONIUM LACTATE 12 % EX CREA: TOPICAL_CREAM | CUTANEOUS | 11 refills | Status: DC | PRN

## 2020-03-19 MED ORDER — HYDROXYZINE HCL 10 MG PO TABS: ORAL_TABLET | ORAL | 1 refills | Status: DC

## 2020-03-19 MED ORDER — TRIAMCINOLONE ACETONIDE 0.1 % EX CREA: 1.0000 "application " | TOPICAL_CREAM | Freq: Two times a day (BID) | CUTANEOUS | 2 refills | Status: DC | PRN

## 2020-03-19 MED ORDER — CICLOPIROX OLAMINE 0.77 % EX CREA: TOPICAL_CREAM | CUTANEOUS | 2 refills | Status: DC

## 2020-03-19 NOTE — Patient Instructions (Addendum)
Gentle Skin Care Guide  1. Bathe no more than once a day.  2. Avoid bathing in hot water  3. Use a mild soap like Dove, Vanicream, Cetaphil, CeraVe. Can use Lever 2000 or Cetaphil antibacterial soap  4. Use soap only where you need it. On most days, use it under your arms, between your legs, and on your feet. Let the water rinse other areas unless visibly dirty.  5. When you get out of the bath/shower, use a towel to gently blot your skin dry, don't rub it.  6. While your skin is still a little damp, apply a moisturizing cream such as Vanicream, CeraVe, Cetaphil, Eucerin, Sarna lotion or plain Vaseline Jelly. For hands apply Neutrogena [place] Hand Cream or Excipial Hand Cream.  7. Reapply moisturizer any time you start to itch or feel dry.  8. Sometimes using free and clear laundry detergents can be helpful. Fabric softener sheets should be avoided. Downy Free & Gentle liquid, or any liquid fabric softener that is free of dyes and perfumes, it acceptable to use  9. If your doctor has given you prescription creams you may apply moisturizers over them    Topical steroids (such as triamcinolone, fluocinolone, fluocinonide, mometasone, clobetasol, halobetasol, betamethasone, hydrocortisone) can cause thinning and lightening of the skin if they are used for too long in the same area. Your physician has selected the right strength medicine for your problem and area affected on the body. Please use your medication only as directed by your physician to prevent side effects.  Avoid applying to face, groin, and axilla. Use as directed. Risk of skin atrophy with long-term use reviewed.   Seborrheic Keratosis  What causes seborrheic keratoses? Seborrheic keratoses are harmless, common skin growths that first appear during adult life.  As time goes by, more growths appear.  Some people may develop a large number of them.  Seborrheic keratoses appear on both covered and uncovered body parts.  They  are not caused by sunlight.  The tendency to develop seborrheic keratoses can be inherited.  They vary in color from skin-colored to gray, brown, or even black.  They can be either smooth or have a rough, warty surface.   Seborrheic keratoses are superficial and look as if they were stuck on the skin.  Under the microscope this type of keratosis looks like layers upon layers of skin.  That is why at times the top layer may seem to fall off, but the rest of the growth remains and re-grows.    Treatment Seborrheic keratoses do not need to be treated, but can easily be removed in the office.  Seborrheic keratoses often cause symptoms when they rub on clothing or jewelry.  Lesions can be in the way of shaving.  If they become inflamed, they can cause itching, soreness, or burning.  Removal of a seborrheic keratosis can be accomplished by freezing, burning, or surgery. If any spot bleeds, scabs, or grows rapidly, please return to have it checked, as these can be an indication of a skin cancer.  Cryotherapy Aftercare  . Wash gently with soap and water everyday.   Marland Kitchen Apply Vaseline and Band-Aid daily until healed.

## 2020-03-19 NOTE — Progress Notes (Signed)
Follow-Up Visit   Subjective  Suzanne Snyder is a 70 y.o. female who presents for the following: Follow-up (Patient is here today for follow up. She states she has not been here for several years and would like to get refills on hydroxyzine 20 mg tablets as needed for itch for eczema on back and legs. Patient also has a spot on right thigh x 1 and spot on right eyelid. She reports her father had skin cancer. Patient also reports a spot on right cheek. ).  Spot on thigh is changing, sore and irritated.  She has a h/o ezcema since childhood.  Her hands are really inflamed right now.  She washes her hands a lot with cooking.  She also has been treated for tinea on feet and needs rfs of the antifungal cream as well.    The following portions of the chart were reviewed this encounter and updated as appropriate:        Objective  Well appearing patient in no apparent distress; mood and affect are within normal limits.  A focused examination was performed including bilateral feet, bilateral hands, bilateral thighs, face . Relevant physical exam findings are noted in the Assessment and Plan.  Objective  bilateral feet: Thickening of toenails with yellow discoloration, mild scaling on plantar feet, web spaces clear  Objective  right anterior thigh x 1, left medial thigh x 1 (2): Erythematous keratotic or waxy stuck-on papule    Objective  BL Hands: Erythema with diffuse scaling entire hands, Pt reports gets itchy on back off and on as well, no problems today  Assessment & Plan  Tinea pedis of both feet bilateral feet  With onychomycosis  Restart Ciclopirox 0.77 % cream apply small amount to affected areas twice a day for fungal infection of feet until rash clear. Apply between toes and on toenails qd as preventative.   Continue using Ammonium lactate 12 % cream apply topically as needed for dry skin on feet. 385 g 11 rf       ciclopirox (LOPROX) 0.77 % cream - bilateral  feet  Inflamed seborrheic keratosis (2) right anterior thigh x 1, left medial thigh x 1  Irritated   Prior to procedure, discussed risks of blister formation, small wound, skin dyspigmentation, or rare scar following cryotherapy.    Destruction of lesion - right anterior thigh x 1, left medial thigh x 1  Destruction method: cryotherapy   Informed consent: discussed and consent obtained   Lesion destroyed using liquid nitrogen: Yes   Region frozen until ice ball extended beyond lesion: Yes   Outcome: patient tolerated procedure well with no complications   Post-procedure details: wound care instructions given    Eczema, unspecified type BL Hands  Hand dermatitis, with flare  Hand Dermatitis is a chronic type of eczema that can come and go on the hands and fingers.  While there is no cure, the rash and symptoms can be managed with topical prescription medications, and for more severe cases, with systemic medications.  Recommend mild soap and routine use of moisturizing cream after handwashing.  Minimize soap/water exposure when possible.   Continue using Cerave anti itch cream for hands/body prn itch  Continue using Ammonium lactate 12 % cream apply topically as needed for dry skin on hands and feet. 385 g 11 rf  Restart hydroxyzine 10 mg tablet take 1 or 2 tables by mouth three times daily as needed for itch 90 pills 1 rf   Start Triamcinolone 0.1 %  cream apply 1 application topically twice daily as needed to affected areas of hands and back until rash is cleared. 80 g  2 rf  Avoid applying to face, groin, and axilla. Use as directed. Risk of skin atrophy with long-term use reviewed.  Topical steroids (such as triamcinolone, fluocinolone, fluocinonide, mometasone, clobetasol, halobetasol, betamethasone, hydrocortisone) can cause thinning and lightening of the skin if they are used for too long in the same area. Your physician has selected the right strength medicine for your problem  and area affected on the body. Please use your medication only as directed by your physician to prevent side effects.         hydrOXYzine (ATARAX/VISTARIL) 10 MG tablet - BL Hands  triamcinolone (KENALOG) 0.1 % - BL Hands  ammonium lactate (LAC-HYDRIN) 12 % cream - BL Hands   Seborrheic Keratoses Rt medial cheek, right upper eyelid - Tiny stuck-on, waxy, flesh papules - Discussed benign etiology and prognosis. - Observe - Call for any changes   Return if symptoms worsen or fail to improve.  I, Asher Muir, CMA, am acting as scribe for Willeen Niece, MD.  Documentation: I have reviewed the above documentation for accuracy and completeness, and I agree with the above.  Willeen Niece MD

## 2020-05-20 ENCOUNTER — Emergency Department
Admission: EM | Admit: 2020-05-20 | Discharge: 2020-05-20 | Disposition: A | Discharge status: Home / Self Care | Payer: Medicare Other | Attending: Emergency Medicine | Admitting: Emergency Medicine

## 2020-05-20 ENCOUNTER — Ambulatory Visit: Payer: TRICARE For Life (TFL) | Admitting: Dermatology

## 2020-05-20 ENCOUNTER — Other Ambulatory Visit: Payer: Self-pay

## 2020-05-20 ENCOUNTER — Emergency Department: Payer: Medicare Other

## 2020-05-20 ENCOUNTER — Encounter: Payer: Self-pay | Admitting: *Deleted

## 2020-05-20 DIAGNOSIS — Z96642 Presence of left artificial hip joint: Secondary | ICD-10-CM | POA: Diagnosis not present

## 2020-05-20 DIAGNOSIS — L03211 Cellulitis of face: Secondary | ICD-10-CM

## 2020-05-20 DIAGNOSIS — J45909 Unspecified asthma, uncomplicated: Secondary | ICD-10-CM | POA: Insufficient documentation

## 2020-05-20 DIAGNOSIS — R220 Localized swelling, mass and lump, head: Secondary | ICD-10-CM | POA: Diagnosis present

## 2020-05-20 DIAGNOSIS — Z23 Encounter for immunization: Secondary | ICD-10-CM | POA: Insufficient documentation

## 2020-05-20 DIAGNOSIS — E119 Type 2 diabetes mellitus without complications: Secondary | ICD-10-CM | POA: Insufficient documentation

## 2020-05-20 DIAGNOSIS — Z794 Long term (current) use of insulin: Secondary | ICD-10-CM | POA: Insufficient documentation

## 2020-05-20 DIAGNOSIS — F17210 Nicotine dependence, cigarettes, uncomplicated: Secondary | ICD-10-CM | POA: Diagnosis not present

## 2020-05-20 DIAGNOSIS — Z7951 Long term (current) use of inhaled steroids: Secondary | ICD-10-CM | POA: Diagnosis not present

## 2020-05-20 DIAGNOSIS — J449 Chronic obstructive pulmonary disease, unspecified: Secondary | ICD-10-CM | POA: Diagnosis not present

## 2020-05-20 LAB — CBC
HCT: 39.1 % (ref 36.0–46.0)
Hemoglobin: 13.1 g/dL (ref 12.0–15.0)
MCH: 32.2 pg (ref 26.0–34.0)
MCHC: 33.5 g/dL (ref 30.0–36.0)
MCV: 96.1 fL (ref 80.0–100.0)
Platelets: 280 10*3/uL (ref 150–400)
RBC: 4.07 MIL/uL (ref 3.87–5.11)
RDW: 14.3 % (ref 11.5–15.5)
WBC: 10 10*3/uL (ref 4.0–10.5)
nRBC: 0 % (ref 0.0–0.2)

## 2020-05-20 LAB — BASIC METABOLIC PANEL
Anion gap: 7 (ref 5–15)
BUN: 14 mg/dL (ref 8–23)
CO2: 23 mmol/L (ref 22–32)
Calcium: 8.8 mg/dL — ABNORMAL LOW (ref 8.9–10.3)
Chloride: 107 mmol/L (ref 98–111)
Creatinine, Ser: 0.68 mg/dL (ref 0.44–1.00)
GFR, Estimated: >60 mL/min (ref >60)
Glucose, Bld: 59 mg/dL — ABNORMAL LOW (ref 70–99)
Potassium: 3.9 mmol/L (ref 3.5–5.1)
Sodium: 137 mmol/L (ref 135–145)

## 2020-05-20 IMAGING — CT CT ORBITS W/ CM
3 series · 14 of 47 positions shown, 16 images · IV contrast (omnipaque)
Comparison: Head CT [DATE].  Maxillofacial CT [DATE].

CLINICAL DATA: Orbital cellulitis. Left eye swelling for 4 days.
Left forehead abscess for 1 week. Increased pain.

EXAM:
CT ORBITS WITH CONTRAST
TECHNIQUE: Multidetector CT images was performed according to the standard
protocol following intravenous contrast administration.
CONTRAST:  75mL OMNIPAQUE IOHEXOL 300 MG/ML  SOLN

[Series 3: orbits 2.0 h30s st · axial · 0.34mm/px · z∈[-96,-10]mm · 8 of 51 slices shown, 10 images]
[im 4/51  brain]
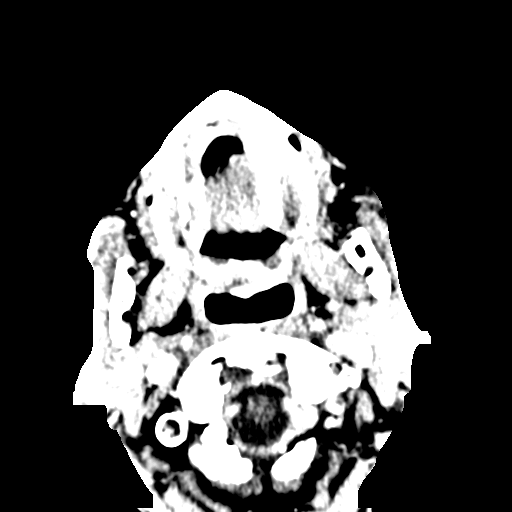
[im 4/51  bone]
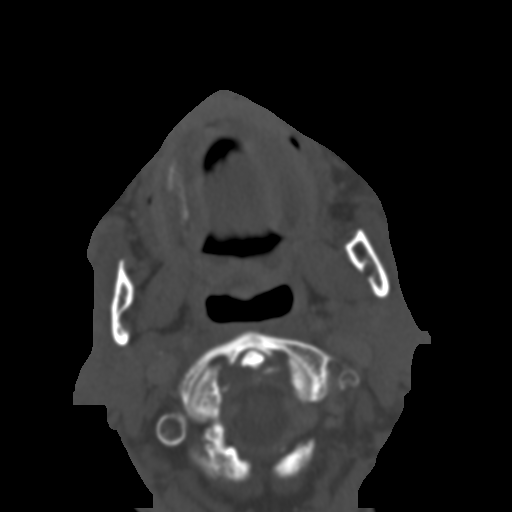
[im 11/51  bone]
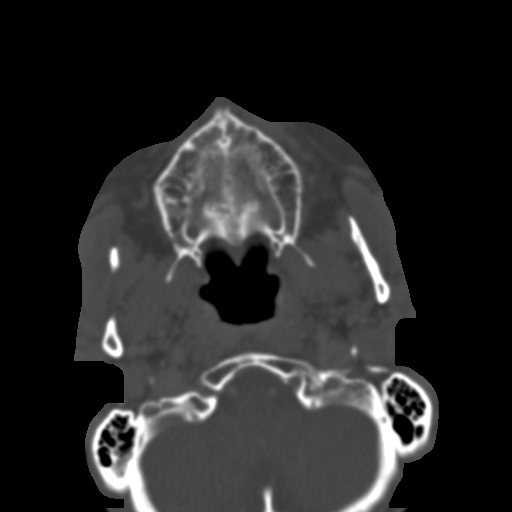
[im 16/51  bone]
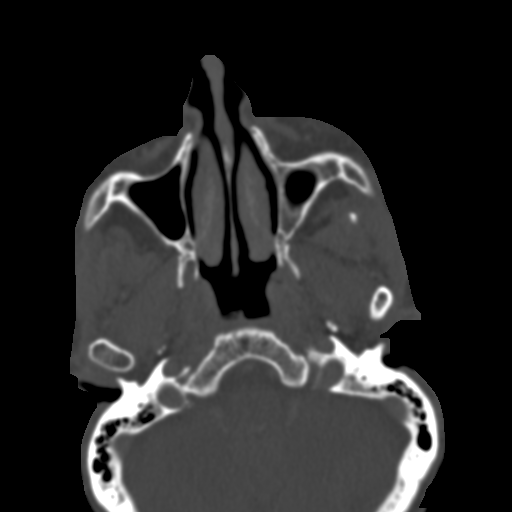
[im 23/51  bone]
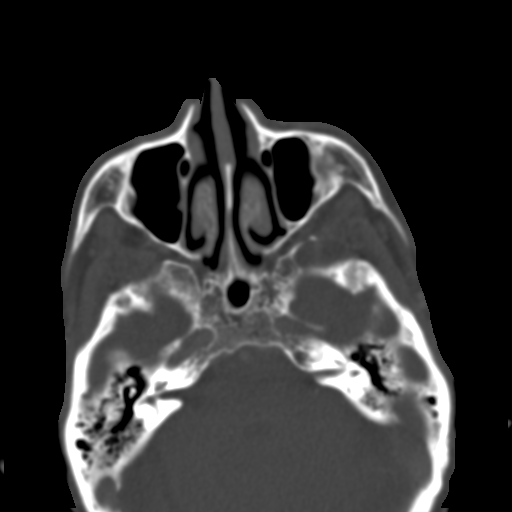
[im 28/51  brain]
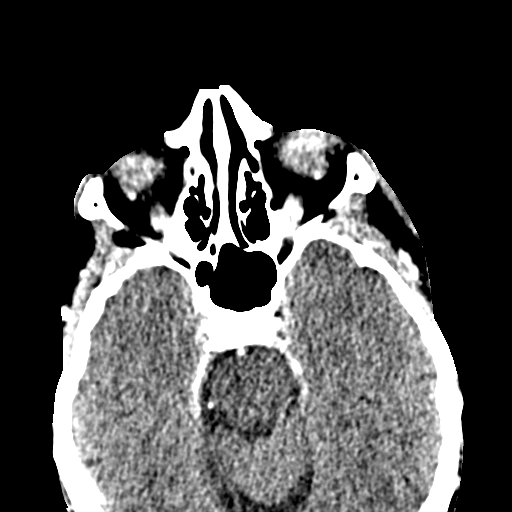
[im 28/51  bone]
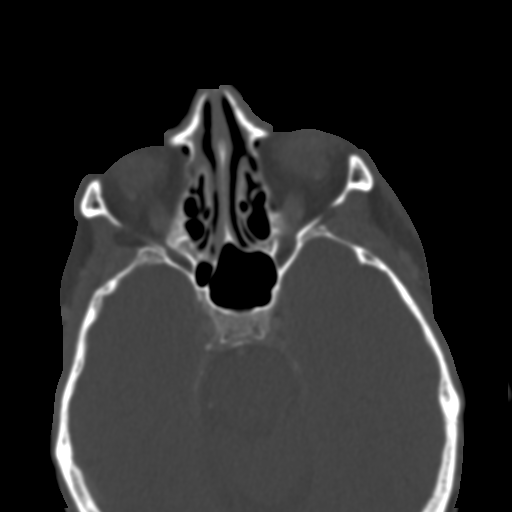
[im 35/51  bone]
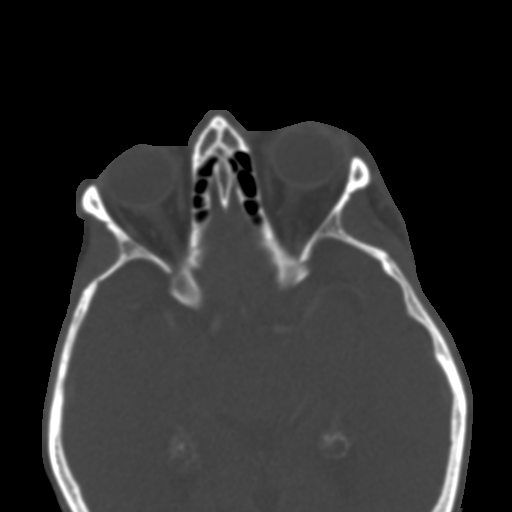
[im 40/51  bone]
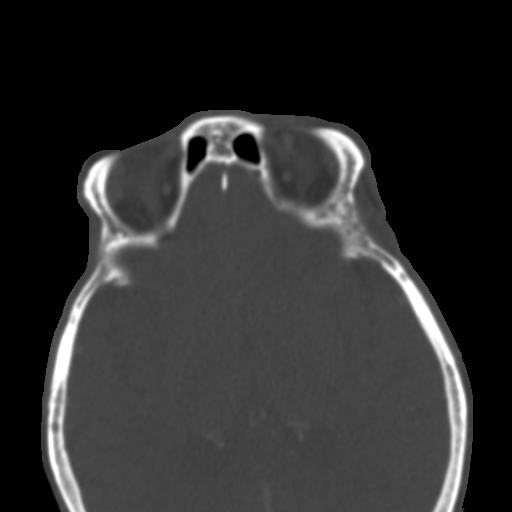
[im 47/51  bone]
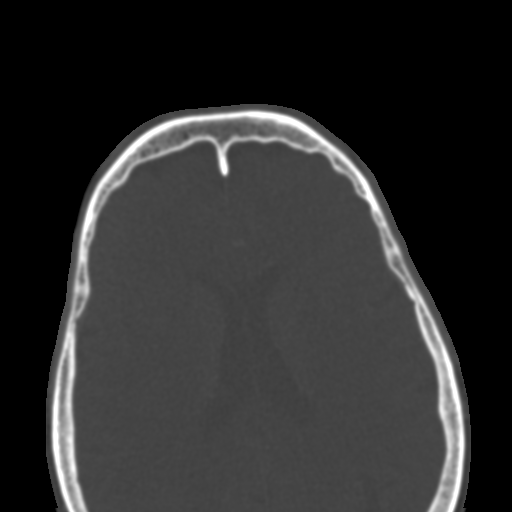

[Series 6: orbits 2.0 coronal · coronal · 0.22mm/px · 3 of 79 slices shown]
[im 27/79  bone]
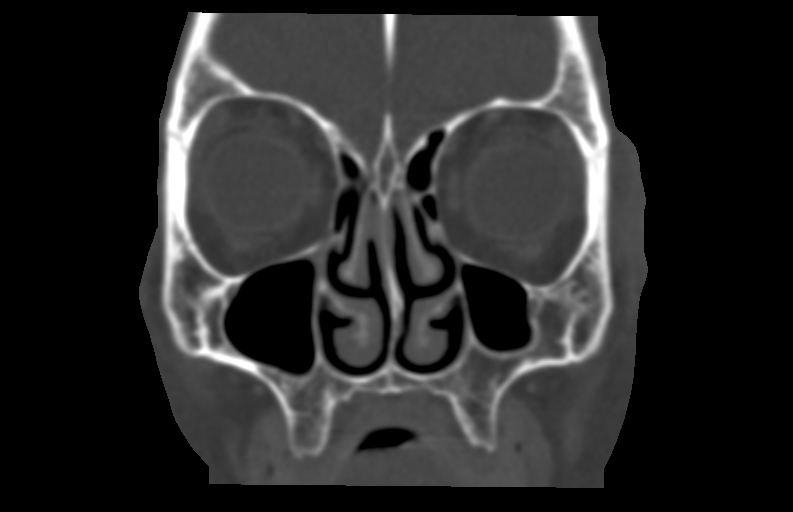
[im 35/79  bone]
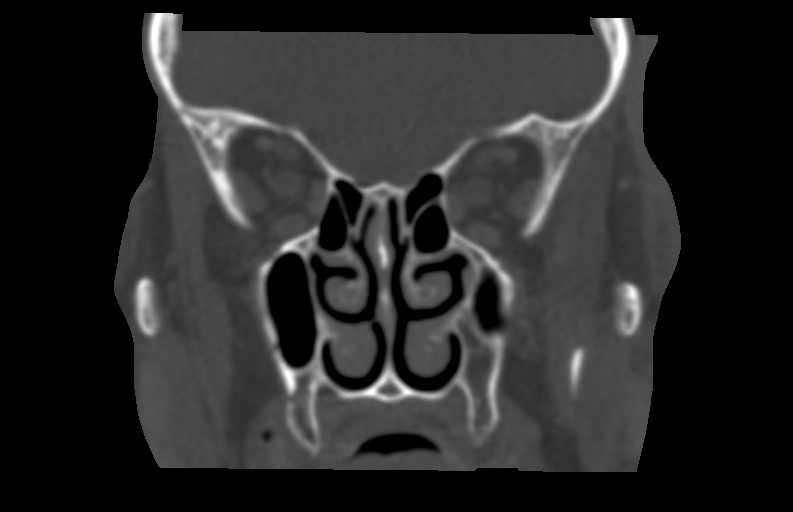
[im 44/79  bone]
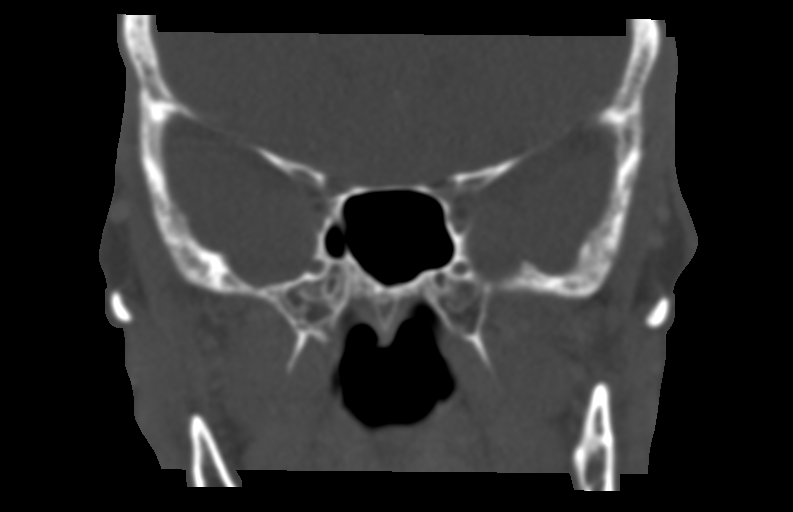

[Series 7: orbits 2.0 sagittal · sagittal · 0.22mm/px · 3 of 76 slices shown]
[im 26/76  bone]
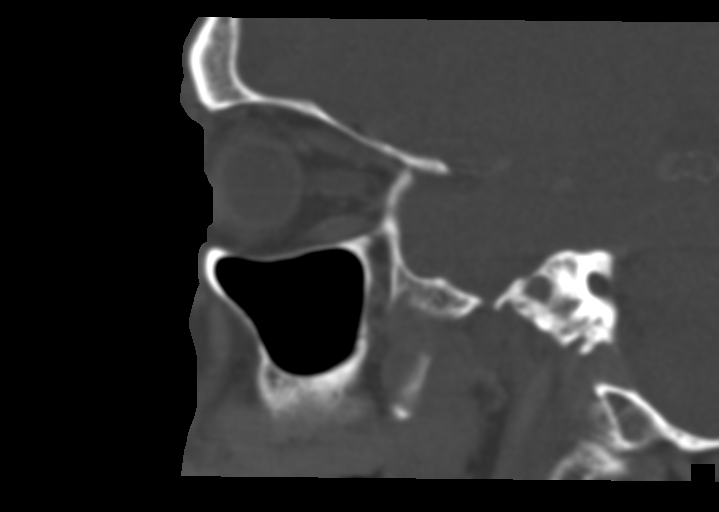
[im 38/76  bone]
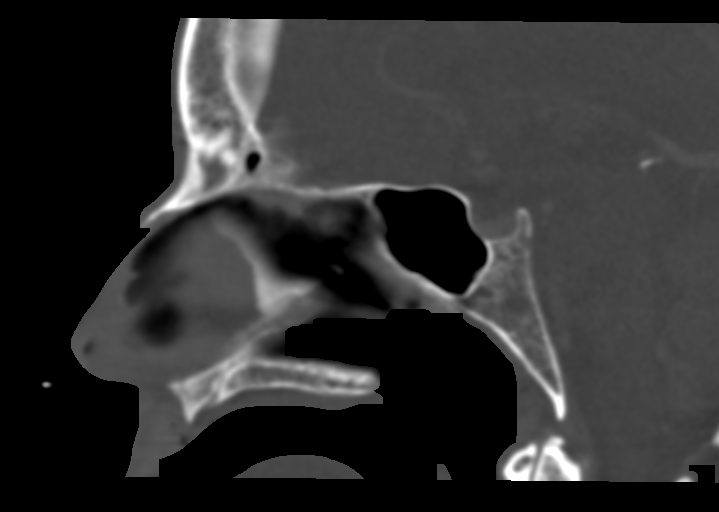
[im 51/76  bone]
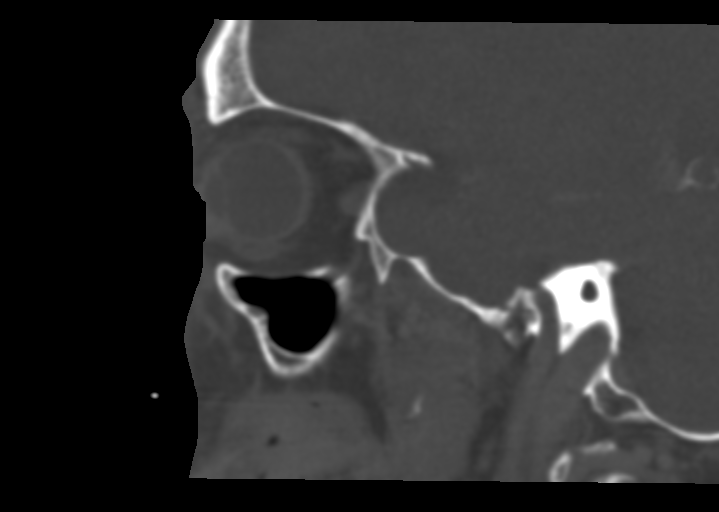

[14 of 47 positions shown; findings below may reference images not displayed]

FINDINGS: Orbits: Bilateral cataract extraction. Moderate left periorbital
soft tissue swelling, predominantly laterally. No postseptal
inflammation or orbital mass.

Visible paranasal sinuses: Paranasal sinuses and mastoid air cells
are clear.

Soft tissues: Left frontotemporal scalp swelling with the superior
extent not included on this study. Approximately 1 cm focus of lower
density within the left lateral frontal scalp soft tissues,
incompletely imaged.

Osseous: No acute fracture or destructive osseous process.

Limited intracranial: Unremarkable.
IMPRESSION: 1. Swelling involving the left frontotemporal scalp and left orbital
preseptal soft tissues suggesting cellulitis. No evidence of
postseptal cellulitis.
2. Partially visualized 1 cm focus of lower density in the left
frontal scalp soft tissues, possibly focal phlegmon or developing
abscess.

## 2020-05-20 MED ORDER — SULFAMETHOXAZOLE-TRIMETHOPRIM 800-160 MG PO TABS: 1.0000 | ORAL_TABLET | Freq: Two times a day (BID) | ORAL | 0 refills | Status: DC

## 2020-05-20 MED ORDER — TETANUS-DIPHTH-ACELL PERTUSSIS 5-2.5-18.5 LF-MCG/0.5 IM SUSY
0.5000 mL | PREFILLED_SYRINGE | Freq: Once | INTRAMUSCULAR | Status: AC
  Administered 2020-05-20: 0.5 mL via INTRAMUSCULAR
  Filled 2020-05-20: qty 0.5

## 2020-05-20 MED ORDER — SODIUM CHLORIDE 0.9 % IV BOLUS
1000.0000 mL | Freq: Once | INTRAVENOUS | Status: AC
  Administered 2020-05-20: 1000 mL via INTRAVENOUS

## 2020-05-20 MED ORDER — IOHEXOL 300 MG/ML  SOLN
75.0000 mL | Freq: Once | INTRAMUSCULAR | Status: AC | PRN
  Administered 2020-05-20: 75 mL via INTRAVENOUS

## 2020-05-20 MED ORDER — CEPHALEXIN 500 MG PO CAPS: 500.0000 mg | ORAL_CAPSULE | Freq: Three times a day (TID) | ORAL | 0 refills | Status: DC

## 2020-05-20 MED ORDER — OXYCODONE-ACETAMINOPHEN 5-325 MG PO TABS: 1.0000 | ORAL_TABLET | Freq: Four times a day (QID) | ORAL | 0 refills | Status: AC | PRN

## 2020-05-20 MED ORDER — ONDANSETRON HCL 4 MG/2ML IJ SOLN
4.0000 mg | Freq: Once | INTRAMUSCULAR | Status: AC
  Administered 2020-05-20: 4 mg via INTRAVENOUS
  Filled 2020-05-20: qty 2

## 2020-05-20 MED ORDER — MORPHINE SULFATE (PF) 4 MG/ML IV SOLN
4.0000 mg | Freq: Once | INTRAVENOUS | Status: AC
  Administered 2020-05-20: 4 mg via INTRAVENOUS
  Filled 2020-05-20: qty 1

## 2020-05-20 NOTE — ED Provider Notes (Signed)
[facility] Emergency Department Provider Note  ____________________________________________  Time seen: Approximately 7:23 PM  I have reviewed the triage vital signs and the nursing notes.   HISTORY  Chief Complaint Abscess and Facial Swelling    HPI Suzanne Snyder is a 70 y.o. female with history of diabetes, smoking, COPD who comes the ED complaining of swelling of the left temple with swelling extending around the left eye as well.  This is been gradually worsening for a week.  She thinks that she was bitten by a spider in her bed while asleep.  This is the fourth time she has had a lesion like this.  Denies fever or chills, no chest pain or shortness of breath, no seizures or abdominal pain or vomiting.  No vision changes, trauma.   Denies ever seeing any brown recluse or black widow spiders   Past Medical History:  Diagnosis Date  . Anxiety   . Arthritis   . Asthma   . Diabetes mellitus without complication (HCC)   . Insulin pump in place   . Osteoporosis   . RLS (restless legs syndrome)   . Smokers' cough (HCC)   . Wears dentures    full upper and lower     There are no problems to display for this patient.    Past Surgical History:  Procedure Laterality Date  . CATARACT EXTRACTION W/PHACO Right 02/20/2020   Procedure: CATARACT EXTRACTION PHACO AND INTRAOCULAR LENS PLACEMENT (IOC) RIGHT 5.60 00:36.3;  Surgeon: Galen Manila, MD;  Location: [facility];  Service: Ophthalmology;  Laterality: Right;  Diabetic - insulin pump Requests not first  . CATARACT EXTRACTION W/PHACO Left 03/05/2020   Procedure: CATARACT EXTRACTION PHACO AND INTRAOCULAR LENS PLACEMENT (IOC) LEFT 4.94 00:31.6;  Surgeon: Galen Manila, MD;  Location: [facility];  Service: Ophthalmology;  Laterality: Left;  . 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 ROTATOR CUFF REPAIR Left 2014 and 2015  . SPINE SURGERY N/A 1984   L4-5, not sure of procedure   . THYROID SURGERY       Prior to Admission medications   Medication Sig Start Date End Date Taking? Authorizing Provider  cephALEXin (KEFLEX) 500 MG capsule Take 1 capsule (500 mg total) by mouth 3 (three) times daily. 05/20/20  Yes Sharman Cheek, MD  oxyCODONE-acetaminophen (PERCOCET) 5-325 MG tablet Take 1 tablet by mouth every 6 (six) hours as needed for up to 2 days for severe pain. 05/20/20 05/22/20 Yes Sharman Cheek, MD  sulfamethoxazole-trimethoprim (BACTRIM DS) 800-160 MG tablet Take 1 tablet by mouth 2 (two) times daily. 05/20/20  Yes Sharman Cheek, MD  acetaminophen (TYLENOL) 325 MG tablet Take 650 mg by mouth every 6 (six) hours as needed.    [provider]  albuterol (VENTOLIN HFA) 108 (90 Base) MCG/ACT inhaler Inhale into the lungs every 6 (six) hours as needed for wheezing or shortness of breath.    [provider]  ammonium lactate (AMLACTIN) 12 % cream Apply topically as needed for dry skin.    [provider]  ammonium lactate (LAC-HYDRIN) 12 % cream Apply topically as needed for dry skin. Apply to affected areas of hands and feet 03/19/20   Willeen Niece, MD  celecoxib (CELEBREX) 100 MG capsule Take 100 mg by mouth 2 (two) times daily.    [provider]  ciclopirox (LOPROX) 0.77 % cream APPLY A SMALL AMOUNT TO AFFECTED AREA TWICE A DAY FOR FUNGAL INFECTION OF THE FEET. APPLY BETWEEN TOES  AND ON TOENAILS 03/19/20   Willeen Niece, MD  clonazePAM (KLONOPIN) 0.5 MG tablet Take 0.5 mg by mouth 2 (two) times daily as needed for anxiety.    [provider]  cyclobenzaprine (FLEXERIL) 10 MG tablet Take 10 mg by mouth 3 (three) times daily as needed for muscle spasms.    [provider]  diphenhydrAMINE HCl (BENADRYL ALLERGY PO) Take 50 mg by mouth as needed.    [provider]  Fluticasone-Salmeterol (ADVAIR) 500-50 MCG/DOSE AEPB Inhale 1 puff into the lungs daily.    [provider]  gabapentin (NEURONTIN) 300  MG capsule Take 300 mg by mouth 4 (four) times daily.    [provider]  hydrOXYzine (ATARAX/VISTARIL) 10 MG tablet TAKE 1 OR 2 TABLET BY MOUTH THREE TIMES DAILY AS NEEDED FOR [facility] 03/19/20   Willeen Niece, MD  insulin aspart (NOVOLOG) 100 UNIT/ML injection Inject into the skin 4 (four) times daily as needed for high blood sugar (with meals and at bedtime).    [provider]  ipratropium (ATROVENT) 0.02 % nebulizer solution Take 0.5 mg by nebulization every 6 (six) hours as needed for wheezing or shortness of breath.    [provider]  lidocaine (LIDODERM) 5 % Place 1 patch onto the skin daily as needed. Remove & Discard patch within 12 hours or as directed by MD    [provider]  montelukast (SINGULAIR) 10 MG tablet Take 10 mg by mouth at bedtime.    [provider]  Multiple Vitamin (MULTIVITAMIN ADULT PO) Take by mouth 2 (two) times daily.    [provider]  rOPINIRole (REQUIP) 1 MG tablet Take 1 mg by mouth daily.    [provider]  tapentadol (NUCYNTA) 50 MG TABS tablet Take 50 mg by mouth.    [provider]  triamcinolone (KENALOG) 0.1 % Apply 1 application topically 2 (two) times daily as needed (Rash). To affected areas of hands and back until rash cleared 03/19/20   Willeen Niece, MD  triamcinolone cream (KENALOG) 0.1 % Apply a small amount to affected area 1 to 2 times daily as needed for itchy areas on back, Avoid face, groin, and underarms. NEEDS APPT FOR FURTHER REFILLS 08/30/19   Willeen Niece, MD  Vitamin D, Ergocalciferol, (DRISDOL) 50000 UNITS CAPS capsule Take 50,000 Units by mouth 2 (two) times a week.    [provider]     Allergies Erythromycin   No family history on file.  Social History Social History   Tobacco Use  . Smoking status: Current Every Day Smoker    Packs/day: 1.00    Years: 50.00    Pack years: 50.00    Types: Cigarettes  . Smokeless tobacco: Never Used  . Tobacco  comment: started smoking age 51  Vaping Use  . Vaping Use: Former  . Devices: "tried it"  Substance Use Topics  . Alcohol use: No  . Drug use: No    Review of Systems  Constitutional:   No fever or chills.  ENT:   No sore throat. No rhinorrhea. Cardiovascular:   No chest pain or syncope. Respiratory:   No dyspnea or cough. Gastrointestinal:   Negative for abdominal pain, vomiting and diarrhea.  Musculoskeletal:   Negative for focal pain or swelling All other systems reviewed and are negative except as documented above in ROS and HPI.  ____________________________________________   PHYSICAL EXAM:  VITAL SIGNS: ED Triage Vitals [05/20/20 1555]  Enc Vitals Group  BP (!) 146/76     Pulse Rate 71     Resp 18     Temp 98.1 F (36.7 C)     Temp Source Oral     SpO2 98 %     Weight 11 lb (4.99 kg)     Height 5\' 8"  (1.727 m)     Head Circumference      Peak Flow      Pain Score 7     Pain Loc      Pain Edu?      Excl. in GC?     Vital signs reviewed, nursing assessments reviewed.   Constitutional:   Alert and oriented. Non-toxic appearance. Eyes:   Conjunctivae are normal. EOMI but somewhat painful with eye movement in the left. PERRL. ENT      Head:   Normocephalic and atraumatic.  There is a 1 cm focus of swelling with a central eschar on the left temporal scalp.  No purulent drainage.  Lesion is tender.  There is no surrounding erythema.  There is watery edema around the left orbit as well without tenderness.      Nose:   Normal .      Mouth/Throat:   Dry mucous membranes      Neck:   No meningismus. Full ROM. Hematological/Lymphatic/Immunilogical:   No cervical lymphadenopathy. Cardiovascular:   RRR. Symmetric bilateral radial and DP pulses.  No murmurs. Cap refill less than 2 seconds. Respiratory:   Normal respiratory effort without tachypnea/retractions. Breath sounds are clear and equal bilaterally. No wheezes/rales/rhonchi. Gastrointestinal:   Soft and  nontender. Non distended. There is no CVA tenderness.  No rebound, rigidity, or guarding. Genitourinary:   deferred Musculoskeletal:   Normal range of motion in all extremities. No joint effusions.  No lower extremity tenderness.  No edema. Neurologic:   Normal speech and language.  Motor grossly intact. No acute focal neurologic deficits are appreciated.  Skin:    Skin is warm, dry and intact. No rash noted.  No petechiae, purpura, or bullae.  ____________________________________________    LABS (pertinent positives/negatives) (all labs ordered are listed, but only abnormal results are displayed) Labs Reviewed  BASIC METABOLIC PANEL - Abnormal; Notable for the following components:      Result Value   Glucose, Bld 59 (*)    Calcium 8.8 (*)    All other components within normal limits  CBC   ____________________________________________   EKG    ____________________________________________    RADIOLOGY  CT Orbits W Contrast  Result Date: 05/20/2020 CLINICAL DATA:  Orbital cellulitis. Left eye swelling for 4 days. Left forehead abscess for 1 week. Increased pain. EXAM: CT ORBITS WITH CONTRAST TECHNIQUE: Multidetector CT images was performed according to the standard protocol following intravenous contrast administration. CONTRAST:  79mL OMNIPAQUE IOHEXOL 300 MG/ML  SOLN COMPARISON:  Head CT 11/24/2010.  Maxillofacial CT 02/03/2008. FINDINGS: Orbits: Bilateral cataract extraction. Moderate left periorbital soft tissue swelling, predominantly laterally. No postseptal inflammation or orbital mass. Visible paranasal sinuses: Paranasal sinuses and mastoid air cells are clear. Soft tissues: Left frontotemporal scalp swelling with the superior extent not included on this study. Approximately 1 cm focus of lower density within the left lateral frontal scalp soft tissues, incompletely imaged. Osseous: No acute fracture or destructive osseous process. Limited intracranial: Unremarkable.  IMPRESSION: 1. Swelling involving the left frontotemporal scalp and left orbital preseptal soft tissues suggesting cellulitis. No evidence of postseptal cellulitis. 2. Partially visualized 1 cm focus of lower density in the left  frontal scalp soft tissues, possibly focal phlegmon or developing abscess. Electronically Signed   By: Sebastian AcheAllen  Grady M.D.   On: 05/20/2020 18:12    ____________________________________________   PROCEDURES Procedures  ____________________________________________  DIFFERENTIAL DIAGNOSIS   Preseptal cellulitis, orbital cellulitis  CLINICAL IMPRESSION / ASSESSMENT AND PLAN / ED COURSE  Medications ordered in the ED: Medications  sodium chloride 0.9 % bolus 1,000 mL (1,000 mLs Intravenous New Bag/Given 05/20/20 1715)  ondansetron (ZOFRAN) injection 4 mg (4 mg Intravenous Given 05/20/20 1714)  morphine 4 MG/ML injection 4 mg (4 mg Intravenous Given 05/20/20 1715)  Tdap (BOOSTRIX) injection 0.5 mL (0.5 mLs Intramuscular Given 05/20/20 1718)  iohexol (OMNIPAQUE) 300 MG/ML solution 75 mL (75 mLs Intravenous Contrast Given 05/20/20 1743)    Pertinent labs & imaging results that were available during my care of the patient were reviewed by me and considered in my medical decision making (see chart for details).  Suzanne Snyder was evaluated in Emergency Department on 05/20/2020 for the symptoms described in the history of present illness. She was evaluated in the context of the global COVID-19 pandemic, which necessitated consideration that the patient might be at risk for infection with the SARS-CoV-2 virus that causes COVID-19. Institutional protocols and algorithms that pertain to the evaluation of patients at risk for COVID-19 are in a state of rapid change based on information released by regulatory bodies including the CDC and federal and state organizations. These policies and algorithms were followed during the patient's care in the ED.   Patient presents with soft tissue  infection over the orbit and area of likely spider bite.  The wound is open to the surface, no contained abscess.  CT scan obtained which is negative for orbital cellulitis.  Will treat with Bactrim and Keflex for facial cellulitis, recommend follow-up with PCP, return precautions discussed.  Tetanus updated.      ____________________________________________   FINAL CLINICAL IMPRESSION(S) / ED DIAGNOSES    Final diagnoses:  Facial cellulitis     ED Discharge Orders         Ordered    cephALEXin (KEFLEX) 500 MG capsule  3 times daily        05/20/20 1923    sulfamethoxazole-trimethoprim (BACTRIM DS) 800-160 MG tablet  2 times daily        05/20/20 1923    oxyCODONE-acetaminophen (PERCOCET) 5-325 MG tablet  Every 6 hours PRN        05/20/20 1923          Portions of this note were generated with dragon dictation software. Dictation errors may occur despite best attempts at proofreading.   Sharman CheekStafford, Yona Stansbury, MD 05/20/20 [phone]

## 2020-05-20 NOTE — ED Notes (Signed)
Patient is resting comfortably. Call light within reach. Fall precautions in place. Pt is awaiting CT scan results and Lab results.

## 2020-05-20 NOTE — ED Triage Notes (Signed)
pt has abscess to left forehead area for 1 week with redness.  No drainage.  Pt has swelling to left eye for 4 days.  Pt has increased pain. Pt alert  Speech clear.  Pt denies injury to face.

## 2020-05-20 NOTE — ED Notes (Signed)
Patient transported to CT 

## 2020-05-21 ENCOUNTER — Ambulatory Visit (INDEPENDENT_AMBULATORY_CARE_PROVIDER_SITE_OTHER): Payer: Medicare Other | Admitting: Dermatology

## 2020-05-21 DIAGNOSIS — R609 Edema, unspecified: Secondary | ICD-10-CM

## 2020-05-21 DIAGNOSIS — R52 Pain, unspecified: Secondary | ICD-10-CM | POA: Diagnosis not present

## 2020-05-21 DIAGNOSIS — L0291 Cutaneous abscess, unspecified: Secondary | ICD-10-CM | POA: Diagnosis not present

## 2020-05-21 MED ORDER — MUPIROCIN 2 % EX OINT: 1.0000 "application " | TOPICAL_OINTMENT | Freq: Two times a day (BID) | CUTANEOUS | 0 refills | Status: DC

## 2020-05-21 NOTE — Progress Notes (Signed)
   Follow-Up Visit   Subjective  Suzanne Snyder is a 70 y.o. female who presents for the following: Insect Bite (Patient here today about a spider bite she reports happened last Wednesday. Patient has a swollen red lump at left temple where she was bitten. She reports going to ED yesterday and was prescribed 2 antibiotics, she has not yet started antibiotics. States she was advised to come here also.).  Patient was prescribed Bactrim and Keflex to use for facial cellulitis  The following portions of the chart were reviewed this encounter and updated as appropriate:      Objective  Well appearing patient in no apparent distress; mood and affect are within normal limits.  A focused examination was performed including face. Relevant physical exam findings are noted in the Assessment and Plan.  Objective  left upper temple: Firm subcutaneous erythematous nodule with central crusting  Prominent swelling L periocular area  Assessment & Plan  Abscess left upper temple  Associated with periocular edema and pain   I & D today, Culture today, will call with result  While waiting for culture result, start antibiotics as prescribed, Bactrim DS 1 PO bid x 7 days and Keflex 500 mg PO tid x 1 week  Rec warm compresses bid 15-20 min, followed by gentle massage to facilitate drainage and mupirocin ointment bid/bandage    Incision and Drainage - left upper temple Location: left upper temple  Informed Consent: Discussed risks (permanent scarring, light or dark discoloration, infection, pain, bleeding, bruising, redness, damage to adjacent structures, and recurrence of the lesion) and benefits of the procedure, as well as the alternatives.  Informed consent was obtained.  Preparation: The area was prepped with alcohol.  Anesthesia: Lidocaine 1% with epinephrine  Procedure Details: An incision was made overlying the lesion. The lesion drained pus. Bacterial culture obtained. A moderate  amount of fluid was drained.    Antibiotic ointment and a sterile pressure dressing were applied. The patient tolerated procedure well.  Total number of lesions drained: 1  Plan: The patient was instructed on post-op care. Recommend OTC analgesia as needed for pain.   Anaerobic and Aerobic Culture - left upper temple  mupirocin ointment (BACTROBAN) 2 % - left upper temple  Return in about 2 weeks (around 06/04/2020) for abscess follow up.  I, Asher Muir, CMA, am acting as scribe for Willeen Niece, MD.  Documentation: I have reviewed the above documentation for accuracy and completeness, and I agree with the above.  Willeen Niece MD

## 2020-05-21 NOTE — Patient Instructions (Addendum)
Recommend warm compresses 15 - 20 mins to affected area of forehead     Wound Care Instructions  1. Cleanse wound gently with soap and water once a day then pat dry with clean gauze. Apply a thing coat of Petrolatum (petroleum jelly, "Vaseline") over the wound (unless you have an allergy to this). We recommend that you use a new, sterile tube of Vaseline. Do not pick or remove scabs. Do not remove the yellow or white "healing tissue" from the base of the wound.  2. Cover the wound with fresh, clean, nonstick gauze and secure with paper tape. You may use Band-Aids in place of gauze and tape if the would is small enough, but would recommend trimming much of the tape off as there is often too much. Sometimes Band-Aids can irritate the skin.  3. You should call the office for your biopsy report after 1 week if you have not already been contacted.  4. If you experience any problems, such as abnormal amounts of bleeding, swelling, significant bruising, significant pain, or evidence of infection, please call the office immediately.  5. FOR ADULT SURGERY PATIENTS: If you need something for pain relief you may take 1 extra strength Tylenol (acetaminophen) AND 2 Ibuprofen (200mg  each) together every 4 hours as needed for pain. (do not take these if you are allergic to them or if you have a reason you should not take them.) Typically, you may only need pain medication for 1 to 3 days.         If you have any questions or concerns for your doctor, please call our main line at [phone] and press option 4 to reach your doctor's medical assistant. If no one answers, please leave a voicemail as directed and we will return your call as soon as possible. Messages left after 4 pm will be answered the following business day.   You may also send [phone] a message via MyChart. We typically respond to MyChart messages within 1-2 business days.  For prescription refills, please ask your pharmacy to contact our  office. Our fax number is [phone].  If you have an urgent issue when the clinic is closed that cannot wait until the next business day, you can page your doctor at the number below.    Please note that while we do our best to be available for urgent issues outside of office hours, we are not available 24/7.   If you have an urgent issue and are unable to reach [phone], you may choose to seek medical care at your doctor's office, retail clinic, urgent care center, or emergency room.  If you have a medical emergency, please immediately call 911 or go to the emergency department.  Pager Numbers  - Dr. [place]: [phone]  - Dr. [phone]: [phone]  - Dr. [phone]: [phone]  In the event of inclement weather, please call our main line at [phone] for an update on the status of any delays or closures.  Dermatology Medication Tips: Please keep the boxes that topical medications come in in order to help keep track of the instructions about where and how to use these. Pharmacies typically print the medication instructions only on the boxes and not directly on the medication tubes.   If your medication is too expensive, please contact our office at [phone] option 4 or send [phone] a message through MyChart.   We are unable to tell what your co-pay for medications will be in advance as this is different depending on  your insurance coverage. However, we may be able to find a substitute medication at lower cost or fill out paperwork to get insurance to cover a needed medication.   If a prior authorization is required to get your medication covered by your insurance company, please allow [place] 1-2 business days to complete this process.  Drug prices often vary depending on where the prescription is filled and some pharmacies may offer cheaper prices.  The website www.goodrx.com contains coupons for medications through different pharmacies. The prices here do not account for what the cost may  be with help from insurance (it may be cheaper with your insurance), but the website can give you the price if you did not use any insurance.  - You can print the associated coupon and take it with your prescription to the pharmacy.  - You may also stop by our office during regular business hours and pick up a GoodRx coupon card.  - If you need your prescription sent electronically to a different pharmacy, notify our office through [facility] or by phone at [phone] option 4.

## 2020-06-04 ENCOUNTER — Ambulatory Visit (INDEPENDENT_AMBULATORY_CARE_PROVIDER_SITE_OTHER): Payer: Medicare Other | Admitting: Dermatology

## 2020-06-04 ENCOUNTER — Other Ambulatory Visit: Payer: Self-pay

## 2020-06-04 ENCOUNTER — Telehealth: Payer: Self-pay

## 2020-06-04 DIAGNOSIS — L0291 Cutaneous abscess, unspecified: Secondary | ICD-10-CM

## 2020-06-04 DIAGNOSIS — L02811 Cutaneous abscess of head [any part, except face]: Secondary | ICD-10-CM

## 2020-06-04 NOTE — Progress Notes (Signed)
   Follow-Up Visit   Subjective  Suzanne Snyder is a 70 y.o. female who presents for the following: 2 week follow up (Patient here today for follow up on bug bite on left upper temple.  She reports area is doing much better and she has completed all her medications. She has no other concerns at this time. ).    The following portions of the chart were reviewed this encounter and updated as appropriate:       Objective  Well appearing patient in no apparent distress; mood and affect are within normal limits.  A focused examination was performed including left upper forehead. Relevant physical exam findings are noted in the Assessment and Plan.  Objective  left upper forehead: Dusky pink crusted papule on left upper temple, decrease erythema and edema   Assessment & Plan  Abscess left upper forehead  Much improved s/p I&D and oral antibiotics. Still healing.  Wash with soap and water Continue using mupirocin 2 % ointment apply to affected area of left forehead twice daily and cover with bandaid  Lab (Bact culture) was not processed due to specimen handling error.  Patient informed of information.   Patient has completed course of Keflex and Bactrim DS   Other Related Medications mupirocin ointment (BACTROBAN) 2 %  Return if symptoms worsen or fail to improve.  I, Asher Muir, CMA, am acting as scribe for Willeen Niece, MD.  Documentation: I have reviewed the above documentation for accuracy and completeness, and I agree with the above.  Willeen Niece MD

## 2020-06-04 NOTE — Telephone Encounter (Signed)
Called patient to inform them lab paper work was mishandled and test was not performed. Also informed patient provider recommended not to be concerned since she was doing better at follow up.   Spoke with [facility] regarding Anaerobic and Aerobic Culture that was ordered on 05/21/20. [facility] stated that department received specimen with no paper work.  Patient denied further questions at this time.

## 2020-06-04 NOTE — Patient Instructions (Addendum)

## 2020-10-08 ENCOUNTER — Ambulatory Visit: Payer: Medicare Other | Attending: Student | Admitting: Physical Therapy

## 2020-10-08 ENCOUNTER — Encounter: Payer: Self-pay | Admitting: Physical Therapy

## 2020-10-08 DIAGNOSIS — G8929 Other chronic pain: Secondary | ICD-10-CM | POA: Insufficient documentation

## 2020-10-08 DIAGNOSIS — M25511 Pain in right shoulder: Secondary | ICD-10-CM | POA: Diagnosis not present

## 2020-10-08 NOTE — Therapy (Signed)
[facility] [address] [place], [place], [address] Phone: [phone]   Fax:  [phone]  Physical Therapy Evaluation  Patient Details  Name: NILANI HUGILL MRN: [id number] Date of Birth: 09-02-1950 No data recorded  Encounter Date: 10/08/2020   PT End of Session - 10/08/20 1521     Visit Number 1    Number of Visits 17    Date for PT Re-Evaluation 12/03/20    PT Start Time 1430    PT Stop Time 1516    PT Time Calculation (min) 46 min    Activity Tolerance Patient tolerated treatment well;Patient limited by lethargy    Behavior During Therapy [facility] for tasks assessed/performed             Past Medical History:  Diagnosis Date   Anxiety    Arthritis    Asthma    Diabetes mellitus without complication (HCC)    Insulin pump in place    Osteoporosis    RLS (restless legs syndrome)    Smokers' cough (HCC)    Wears dentures    full upper and lower    Past Surgical History:  Procedure Laterality Date   CATARACT EXTRACTION W/PHACO Right 02/20/2020   Procedure: CATARACT EXTRACTION PHACO AND INTRAOCULAR LENS PLACEMENT (IOC) RIGHT 5.60 00:36.3;  Surgeon: Galen Manila, MD;  Location: [facility];  Service: Ophthalmology;  Laterality: Right;  Diabetic - insulin pump Requests not first   CATARACT EXTRACTION W/PHACO Left 03/05/2020   Procedure: CATARACT EXTRACTION PHACO AND INTRAOCULAR LENS PLACEMENT (IOC) LEFT 4.94 00:31.6;  Surgeon: Galen Manila, MD;  Location: [facility];  Service: Ophthalmology;  Laterality: Left;   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    Subjective Assessment - 10/08/20 1524     Subjective Joyanna Kleman is a 70 y.o. female who presents to therapy with chronic R shoulder pain. Patient reports  increased pain with inability to lift her R arm above shoulder height. Patient reports increased pain with all arm movements with aggravating factors primarily with lifting and carrying. Patient demonstrates decreased ability to perform ADLs such as reaching in a high cabinet, laundry, and preparing food. Pt reports taking tylenol, gabapentin, and tramadol for pain relief. Pt is not currently working, endorses minimal hobbies and interests outside of her home. Pt denies any unexplained weight fluctuation, saddle paresthesia, loss of bowel/bladder function, or unrelenting night pain at this time. Patient has a PMH of L rTSA, chronic L shoulder pain, anxiety, DM I, COPD. Pt is lethagric throughout subjective report, with eyes closed often. Pt also reports she is emotionally distressed d/t her daughter being in a facility and having traffic court, which has caused her to lack sleep in the past few days.    Pertinent History Tali Cleaves is a 70 y.o. female who presents to therapy with chronic R shoulder pain. Patient reports increased pain with inability to lift her R arm above shoulder height. Patient reports increased pain with all arm movements with aggravating factors primarily with lifting and carrying. Patient demonstrates decreased ability to perform ADLs such as reaching in a high cabinet, laundry, and preparing food. Pt reports taking tylenol, gabapentin, and tramadol for pain relief. Pt is not currently working, endorses  minimal hobbies and interests outside of her home. Pt denies any unexplained weight fluctuation, saddle paresthesia, loss of bowel/bladder function, or unrelenting night pain at this time. Patient has a PMH of L rTSA, chronic L shoulder pain, anxiety, DM I, COPD. Pt is lethagric throughout subjective report, with eyes closed often. Pt also reports she is emotionally distressed d/t her daughter being in a facility and having traffic court, which has caused her to lack sleep in the past few  days.    Limitations Lifting;Sitting;Standing;Walking    How long can you sit comfortably? na    How long can you stand comfortably? na    How long can you walk comfortably? na    Diagnostic tests xra    Patient Stated Goals To improve shoulder function and decrease pain    Pain Onset More than a month ago             Cervical ROM L/R  [facility]  R Shoulder AROM (sitting)/ PROM (Supine) Flexion: approx 70 deg /180 deg Abduction: approx 40 deg/180 deg IR: above belt line/ 90 @ 90 deg abd ER: cervical level approx c1-c2/ 90 @ 90 deg abd     Shoulder Strength  MMT L/R Flexion: 3-/5; 3-/5 Abduction:3-/5; 3-/5 IR:3+/5; 4/5 ER: 3-/5; 4-/-5 *all within available range   Elbow MMT  L/R Flex 4+/4+ Ext 4/4   Passive Accessory Motion of Glenohumeral Joint:  Inferior, Superior, Lateral, Posterior: all accessory motions painful    Special Tests: Subacromial Impingement (cluster) *Hawkin's Kennedy (+) *Neer's (-) * Painful Arc (+)  Rotator cuff tear Drop Sign (-) Empty Can(+) Full Can (+)  Grip Strength: appears strong and equal   UE Sensation: appears intact bilaterally  Posture: Forward head, rounder shoulders, increased thoracic kyphosis  Palpation: Increase TTP R upper trap, pec minor   Objective measurements completed on examination: See above findings.   Ther-Ex PT reviewed the following HEP with patient with patient able to demonstrate a set of the following with min cuing for correction needed. PT educated patient on parameters of therex (how/when to inc/decrease intensity, frequency, rep/set range, stretch hold time, and purpose of therex) with verbalized understanding.   Shoulder Isometrics all directions except EXT 10-15 sec hold x 5 - 2-3/week             PT Education - 10/08/20 1515     Education Details Patient was educated on diagnosis, anatomy and pathology involved, prognosis, role of PT, and was given an HEP, demonstrating exercise with  proper form following verbal and tactile cues, and was given a paper hand out to continue exercise at home. Pt was educated on and agreed to plan of care.    Person(s) Educated Patient    Methods Explanation;Demonstration    Comprehension Verbalized understanding;Returned demonstration              PT Short Term Goals - 10/08/20 1606       PT SHORT TERM GOAL #1   Title Pt will demonstrate independence with HEP to improve R shoulder function for increased ability to participate with ADLs    Baseline HEP given    Time 4    Status New    Target Date 11/05/20               PT Long Term Goals - 10/08/20 1608       PT LONG TERM GOAL #1   Title Patient will improve R shoulder flex/abd AROM to 160 degrees in sitting  without pain greater than 5/10 on NPRS.    Baseline 10/09/20 80d AROM    Time 8    Period Weeks    Status New      PT LONG TERM GOAL #2   Title Patient will improve all R shoulder strength to 4/5 for flexion, abd, and rotation in order to improve ability to perform moderate household chores.    Baseline 10/09/20 3/5 - 3+/5 within availible motion    Time 8    Period Weeks    Status New      PT LONG TERM GOAL #3   Title Patient will increase FOTO score to 58 to demonstrate predicted increase in functional mobility to complete ADLs    Baseline 10/09/20 49    Time 8    Period Weeks    Status New      PT LONG TERM GOAL #4   Title Pt will decrease worst pain as reported on NPRS by at least 3 points in order to demonstrate clinically significant reduction in pain.    Baseline 10/08/20: 8/10    Time 8    Period Weeks    Status New                    Plan - 10/08/20 1601     Clinical Impression Statement Pt is a 70 y.o female presenting today with R shoulder pain. Exam limited by patient's lethargy, and inconsistent movement limitations. Possible s/s of RTC dysfunction (infraspinatus and supraspinatus esp), with subacromial impingement- differential  from adhesive capsulitis clear d/t non-exam movements with less limitations noted. Patient presents with impairments with decreased shoulder/periscapular strength, AROM, PROM, postural dysfunction, and pain. Activity limitations with lifting, carrying, pushing, pulling, and sleeping prohibiting participation in completing ADLs household chores and recreational activities. Patient will benefit from skilled PT to address these impairments and return to PLOF.    Personal Factors and Comorbidities Age;Behavior Pattern;Past/Current Experience;Comorbidity 3+    Comorbidities DM I, anxiety, COPD, smoking    Examination-Activity Limitations Carry;Sleep;Lift;Dressing    Examination-Participation Restrictions Community Activity;Laundry;Cleaning;Driving    Stability/Clinical Decision Making Evolving/Moderate complexity    Clinical Decision Making Moderate    Rehab Potential Fair    PT Frequency 2x / week    PT Duration 8 weeks    PT Treatment/Interventions Passive range of motion;Manual techniques;Electrical Stimulation;Cryotherapy;Moist Heat;Ultrasound;Therapeutic exercise;Therapeutic activities;Neuromuscular re-education;Patient/family education;Scar mobilization;Dry needling;Iontophoresis 4mg /ml Dexamethasone;Joint Manipulations;Energy conservation;ADLs/Self Care Home Management    PT Next Visit Plan AROM exercises    PT Home Exercise Plan isometrics    Consulted and Agree with Plan of Care Patient             Patient will benefit from skilled therapeutic intervention in order to improve the following deficits and impairments:  Pain, Impaired sensation, Decreased coordination, Decreased mobility, Increased muscle spasms, Decreased activity tolerance, Decreased endurance, Decreased range of motion, Decreased strength, Postural dysfunction, Increased fascial restricitons  Visit Diagnosis: Chronic right shoulder pain     Problem List There are no problems to display for this  patient.    DPT  Hilda Lias, SPT  Romilda Joy, PT 10/09/2020, 11:19 AM  [facility]IC[facility] [address], [place] Phone: [phone]   Fax:  [phone]  Name: JANNELLE NOTARO MRN: [name] Date of Birth: 09-Feb-1950

## 2020-10-10 ENCOUNTER — Ambulatory Visit: Payer: Medicare Other | Admitting: Physical Therapy

## 2020-10-14 ENCOUNTER — Ambulatory Visit: Payer: Medicare Other | Attending: Student | Admitting: Physical Therapy

## 2020-10-14 ENCOUNTER — Encounter: Payer: Self-pay | Admitting: Physical Therapy

## 2020-10-14 DIAGNOSIS — M25511 Pain in right shoulder: Secondary | ICD-10-CM | POA: Diagnosis not present

## 2020-10-14 DIAGNOSIS — M25612 Stiffness of left shoulder, not elsewhere classified: Secondary | ICD-10-CM | POA: Diagnosis present

## 2020-10-14 DIAGNOSIS — M6281 Muscle weakness (generalized): Secondary | ICD-10-CM | POA: Insufficient documentation

## 2020-10-14 DIAGNOSIS — G8929 Other chronic pain: Secondary | ICD-10-CM | POA: Insufficient documentation

## 2020-10-14 NOTE — Therapy (Signed)
[facility] [address] [place], [place], [address] Phone: [phone]   Fax:  [phone]  Physical Therapy Treatment  Patient Details  Name: Suzanne Snyder MRN: [id number] Date of Birth: [birth year] No data recorded  Encounter Date: 10/14/2020   PT End of Session - 10/14/20 1513     Visit Number 2    Number of Visits 17    Date for PT Re-Evaluation 12/03/20    PT Start Time 1427    PT Stop Time 1508    PT Time Calculation (min) 41 min    Activity Tolerance Patient tolerated treatment well    Behavior During Therapy [facility] for tasks assessed/performed             Past Medical History:  Diagnosis Date   Anxiety    Arthritis    Asthma    Diabetes mellitus without complication (HCC)    Insulin pump in place    Osteoporosis    RLS (restless legs syndrome)    Smokers' cough (HCC)    Wears dentures    full upper and lower    Past Surgical History:  Procedure Laterality Date   CATARACT EXTRACTION W/PHACO Right 02/20/2020   Procedure: CATARACT EXTRACTION PHACO AND INTRAOCULAR LENS PLACEMENT (IOC) RIGHT 5.60 00:36.3;  Surgeon: Galen Manila, MD;  Location: [facility];  Service: Ophthalmology;  Laterality: Right;  Diabetic - insulin pump Requests not first   CATARACT EXTRACTION W/PHACO Left 03/05/2020   Procedure: CATARACT EXTRACTION PHACO AND INTRAOCULAR LENS PLACEMENT (IOC) LEFT 4.94 00:31.6;  Surgeon: Galen Manila, MD;  Location: [facility];  Service: Ophthalmology;  Laterality: Left;   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   Subjective Assessment - 10/14/20 1426     Subjective Pt reports active participation in HEP but reports that the exercises hurt. Pt reports her R shoulder pain is 6/10 and has pain in her  deltoids that runs down her arm.    Pertinent History Suzanne Snyder is a 70 y.o. female who presents to therapy with chronic R shoulder pain. Patient reports increased pain with inability to lift her R arm above shoulder height. Patient reports increased pain with all arm movements with aggravating factors primarily with lifting and carrying. Patient demonstrates decreased ability to perform ADLs such as reaching in a high cabinet, laundry, and preparing food. Pt reports taking tylenol, gabapentin, and tramadol for pain relief. Pt is not currently working, endorses minimal hobbies and interests outside of her home. Pt denies any unexplained weight fluctuation, saddle paresthesia, loss of bowel/bladder function, or unrelenting night pain at this time. Patient has a PMH of L rTSA, chronic L shoulder pain, anxiety, DM I, COPD. Pt is lethagric throughout subjective report, with eyes closed often. Pt also reports she is emotionally distressed d/t her daughter being in a facility and having traffic court, which has caused her to lack sleep in the past few days.    Limitations Lifting;Sitting;Standing;Walking    How long can you sit comfortably? na    How long can you stand comfortably? na    How long can you walk comfortably? na    Diagnostic tests xra    Patient Stated Goals To improve shoulder function and decrease  pain             Therex  AAROM Flex PVC x 15 reps AAROM TRX ABD x 10 repss  AROM UE Ranger FLEX to 120 deg x 10 reps   AROM UE Ranger ABD to 120 deg x 10 reps  B UE Pendulums 2 x 30 secs  IR/ER 1 x 10 reps YTP VC for initial set up  Shoulder flex isometric 6 x 10 sec hold  Shoulder Ext YTP 1 x 10 reps with emphasis on scapular retraction   Manual therapy  PROM with PVC UE flex x 15 reps                             PT Education - 10/15/20 0856     Education Details therex form/technique    Person(s) Educated Patient    Methods  Explanation;Demonstration;Verbal cues    Comprehension Verbalized understanding;Returned demonstration;Verbal cues required              PT Short Term Goals - 10/08/20 1606       PT SHORT TERM GOAL #1   Title Pt will demonstrate independence with HEP to improve R shoulder function for increased ability to participate with ADLs    Baseline HEP given    Time 4    Status New    Target Date 11/05/20               PT Long Term Goals - 10/08/20 1608       PT LONG TERM GOAL #1   Title Patient will improve R shoulder flex/abd AROM to 160 degrees in sitting without pain greater than 5/10 on NPRS.    Baseline 10/09/20 80d AROM    Time 8    Period Weeks    Status New      PT LONG TERM GOAL #2   Title Patient will improve all R shoulder strength to 4/5 for flexion, abd, and rotation in order to improve ability to perform moderate household chores.    Baseline 10/09/20 3/5 - 3+/5 within availible motion    Time 8    Period Weeks    Status New      PT LONG TERM GOAL #3   Title Patient will increase FOTO score to 58 to demonstrate predicted increase in functional mobility to complete ADLs    Baseline 10/09/20 49    Time 8    Period Weeks    Status New      PT LONG TERM GOAL #4   Title Pt will decrease worst pain as reported on NPRS by at least 3 points in order to demonstrate clinically significant reduction in pain.    Baseline 10/08/20: 8/10    Time 8    Period Weeks    Status New                   Plan - 10/14/20 1609     Clinical Impression Statement PT initiated UE therex focusing on AA/A/ROM and periscapular strengthening. Pt tolerated session well evidenced by decrease in pain from 6/10 to 3/10 following therapeutic exercise. Pt however continues to be inconsistent with presentation regarding R shoulder ROM, strength, and pain. Patient will continue to benefit from skilled therapy for exercise performance to ensure proper technique for safe and effective  progression.    Personal Factors and Comorbidities Age;Behavior Pattern;Past/Current Experience;Comorbidity 3+    Comorbidities DM I, anxiety, COPD, smoking  Examination-Activity Limitations Carry;Sleep;Lift;Dressing    Examination-Participation Restrictions Community Activity;Laundry;Cleaning;Driving    Stability/Clinical Decision Making Evolving/Moderate complexity    Clinical Decision Making Moderate    Rehab Potential Fair    PT Frequency 2x / week    PT Duration 8 weeks    PT Treatment/Interventions Passive range of motion;Manual techniques;Electrical Stimulation;Cryotherapy;Moist Heat;Ultrasound;Therapeutic exercise;Therapeutic activities;Neuromuscular re-education;Patient/family education;Scar mobilization;Dry needling;Iontophoresis 4mg /ml Dexamethasone;Joint Manipulations;Energy conservation;ADLs/Self Care Home Management    PT Next Visit Plan AROM exercises    PT Home Exercise Plan ER/IR, shoulder ext YTB    Consulted and Agree with Plan of Care Patient             Patient will benefit from skilled therapeutic intervention in order to improve the following deficits and impairments:  Pain, Impaired sensation, Decreased coordination, Decreased mobility, Increased muscle spasms, Decreased activity tolerance, Decreased endurance, Decreased range of motion, Decreased strength, Postural dysfunction, Increased fascial restricitons  Visit Diagnosis: Chronic right shoulder pain     Problem List There are no problems to display for this patient.    DPT Hilda Lias, SPT  Romilda Joy, PT 10/15/2020, 8:59 AM  [facility] PHYSICAL AND SPORTS MEDICINE [address], [place] Phone: [phone]   Fax:  [phone]  Name: TONANTZIN MIMNAUGH MRN: [name] Date of Birth: [birth year]

## 2020-10-16 ENCOUNTER — Ambulatory Visit: Payer: Medicare Other | Admitting: Physical Therapy

## 2020-10-16 ENCOUNTER — Encounter: Payer: Self-pay | Admitting: Physical Therapy

## 2020-10-16 DIAGNOSIS — M6281 Muscle weakness (generalized): Secondary | ICD-10-CM

## 2020-10-16 DIAGNOSIS — M25511 Pain in right shoulder: Secondary | ICD-10-CM | POA: Diagnosis not present

## 2020-10-16 DIAGNOSIS — G8929 Other chronic pain: Secondary | ICD-10-CM

## 2020-10-16 DIAGNOSIS — M25612 Stiffness of left shoulder, not elsewhere classified: Secondary | ICD-10-CM

## 2020-10-16 NOTE — Therapy (Signed)
[facility] [address] [place], [place], [address] Phone: [phone]   Fax:  [phone]  Physical Therapy Treatment  Patient Details  Name: Suzanne Snyder MRN: [id number] Date of Birth: 10-28-1950 No data recorded  Encounter Date: 10/16/2020   PT End of Session - 10/16/20 1352     Visit Number 3    Number of Visits 17    Date for PT Re-Evaluation 12/03/20    Authorization Type 3/10    PT Start Time 1345    PT Stop Time 1425    PT Time Calculation (min) 40 min    Activity Tolerance Patient tolerated treatment well    Behavior During Therapy [facility] for tasks assessed/performed             Past Medical History:  Diagnosis Date   Anxiety    Arthritis    Asthma    Diabetes mellitus without complication (HCC)    Insulin pump in place    Osteoporosis    RLS (restless legs syndrome)    Smokers' cough (HCC)    Wears dentures    full upper and lower    Past Surgical History:  Procedure Laterality Date   CATARACT EXTRACTION W/PHACO Right 02/20/2020   Procedure: CATARACT EXTRACTION PHACO AND INTRAOCULAR LENS PLACEMENT (IOC) RIGHT 5.60 00:36.3;  Surgeon: Galen Manila, MD;  Location: [facility];  Service: Ophthalmology;  Laterality: Right;  Diabetic - insulin pump Requests not first   CATARACT EXTRACTION W/PHACO Left 03/05/2020   Procedure: CATARACT EXTRACTION PHACO AND INTRAOCULAR LENS PLACEMENT (IOC) LEFT 4.94 00:31.6;  Surgeon: Galen Manila, MD;  Location: [facility];  Service: Ophthalmology;  Laterality: Left;   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   Subjective Assessment - 10/16/20 1348     Subjective Patient reports her shoulder is not doing well today, reporting 5/10 pain. Reports she is completing her HEP  and she finds the exercises with the band easier than "not holding onto anything    Pertinent History Suzanne Snyder is a 70 y.o. female who presents to therapy with chronic R shoulder pain. Patient reports increased pain with inability to lift her R arm above shoulder height. Patient reports increased pain with all arm movements with aggravating factors primarily with lifting and carrying. Patient demonstrates decreased ability to perform ADLs such as reaching in a high cabinet, laundry, and preparing food. Pt reports taking tylenol, gabapentin, and tramadol for pain relief. Pt is not currently working, endorses minimal hobbies and interests outside of her home. Pt denies any unexplained weight fluctuation, saddle paresthesia, loss of bowel/bladder function, or unrelenting night pain at this time. Patient has a PMH of L rTSA, chronic L shoulder pain, anxiety, DM I, COPD. Pt is lethagric throughout subjective report, with eyes closed often. Pt also reports she is emotionally distressed d/t her daughter being in a facility and having traffic court, which has caused her to lack sleep in the past few days.    Limitations Lifting;Sitting;Standing;Walking    How long can you sit comfortably? na    How long can you stand comfortably? na    How long can you walk comfortably? na    Diagnostic tests xra    Patient Stated  Goals To improve shoulder function and decrease pain    Pain Onset More than a month ago             Therex Pulleys flex and abd x15x each direction 3sec holds *pt with eyes closed throughout exercises reporting she is not getting any sleep d/t shoulder pain and foot pain  Towel IR AAROM x12  with AAROM improvement from L PSIS to T12 by last rep  Short lever flex 2x 6 to approx 90d for decreased pain range, seated with cuing for scap retraction into back of chair throughout exercise Short lever abd 2x 8 with same set up as above Active ER <> IR 2x 8 with elbow at side with towel at side  in minimal ROM  Bicep curl seated with elbow 2in from thorax 2# 2x 8 Seated bilat Seated scapular retraction YTB x10; RTB x10  Plantigrade UE wt shifting x12; on large balance disc x12 with patient reporting minimal pain on flat surface, more difficulty with balance disc   B UE Pendulums 30 secs                                                PT Education - 10/16/20 1352     Education Details therex form/technique    Person(s) Educated Patient    Methods Explanation;Demonstration;Verbal cues    Comprehension Verbalized understanding;Returned demonstration;Verbal cues required              PT Short Term Goals - 10/08/20 1606       PT SHORT TERM GOAL #1   Title Pt will demonstrate independence with HEP to improve R shoulder function for increased ability to participate with ADLs    Baseline HEP given    Time 4    Status New    Target Date 11/05/20               PT Long Term Goals - 10/08/20 1608       PT LONG TERM GOAL #1   Title Patient will improve R shoulder flex/abd AROM to 160 degrees in sitting without pain greater than 5/10 on NPRS.    Baseline 10/09/20 80d AROM    Time 8    Period Weeks    Status New      PT LONG TERM GOAL #2   Title Patient will improve all R shoulder strength to 4/5 for flexion, abd, and rotation in order to improve ability to perform moderate household chores.    Baseline 10/09/20 3/5 - 3+/5 within availible motion    Time 8    Period Weeks    Status New      PT LONG TERM GOAL #3   Title Patient will increase FOTO score to 58 to demonstrate predicted increase in functional mobility to complete ADLs    Baseline 10/09/20 49    Time 8    Period Weeks    Status New      PT LONG TERM GOAL #4   Title Pt will decrease worst pain as reported on NPRS by at least 3 points in order to demonstrate clinically significant reduction in pain.    Baseline 10/08/20: 8/10    Time 8    Period Weeks    Status New  Plan - 10/16/20 1401     Clinical Impression Statement PT continued therex progression for increased mobility and gentle strengthening with success. When asked if patient can raise RUE she demonstrates 180d of overhead flexion (with compensatory strategies) with difficulty with lower, assisting with LUE. Motion is improving, but patient reports increased pain, especially with ER/IR even at thorax. Patient reports she thinks she could "heal" better with improved sleep PT spent time educating patient on sleeping positions with shoulder supported and on the importance of nutrition and smoking sensation for improved healing as well. Patient reports she is aware she should cease smoking, but is not ready at this time, denies need for resources for this. At end of session patient reports fatigue, but decreased pain to 4/10. PT encouraged patient in HEP and in small, pain free motions, with understanding. Pt will continue to benfit from skilled PT to improve motion and funciton needed for self care and ADL management.    Personal Factors and Comorbidities Age;Behavior Pattern;Past/Current Experience;Comorbidity 3+    Comorbidities DM I, anxiety, COPD, smoking    Examination-Activity Limitations Carry;Sleep;Lift;Dressing    Examination-Participation Restrictions Community Activity;Laundry;Cleaning;Driving    Stability/Clinical Decision Making Evolving/Moderate complexity    Clinical Decision Making Moderate    Rehab Potential Fair    Clinical Impairments Affecting Rehab Potential (+) highly motivated, (-) age, decreased strength    PT Frequency 2x / week    PT Duration 8 weeks    PT Treatment/Interventions Passive range of motion;Manual techniques;Electrical Stimulation;Cryotherapy;Moist Heat;Ultrasound;Therapeutic exercise;Therapeutic activities;Neuromuscular re-education;Patient/family education;Scar mobilization;Dry needling;Iontophoresis 4mg /ml Dexamethasone;Joint  Manipulations;Energy conservation;ADLs/Self Care Home Management    PT Next Visit Plan AROM exercises    PT Home Exercise Plan ER/IR, shoulder ext YTB    Consulted and Agree with Plan of Care Patient             Patient will benefit from skilled therapeutic intervention in order to improve the following deficits and impairments:  Pain, Impaired sensation, Decreased coordination, Decreased mobility, Increased muscle spasms, Decreased activity tolerance, Decreased endurance, Decreased range of motion, Decreased strength, Postural dysfunction, Increased fascial restricitons  Visit Diagnosis: Chronic right shoulder pain  Stiffness of left shoulder, not elsewhere classified  Muscle weakness (generalized)     Problem List There are no problems to display for this patient.  DPT Hilda Lias, PT 10/16/2020, 2:36 PM  [facility] PHYS[facility] AND SPORTS MEDICINE [address], [place] Phone: [phone]   Fax:  [phone]  Name: Suzanne Snyder MRN: [name] Date of Birth: 1950/08/05

## 2020-10-22 ENCOUNTER — Ambulatory Visit: Payer: Medicare Other | Admitting: Physical Therapy

## 2020-10-22 ENCOUNTER — Encounter: Payer: Self-pay | Admitting: Physical Therapy

## 2020-10-22 DIAGNOSIS — M25511 Pain in right shoulder: Secondary | ICD-10-CM | POA: Diagnosis not present

## 2020-10-22 DIAGNOSIS — G8929 Other chronic pain: Secondary | ICD-10-CM

## 2020-10-22 NOTE — Therapy (Signed)
[place] [facility] [address] [place], [address] Phone: [phone]   Fax:  [phone]  Physical Therapy Treatment  Patient Details  Name: Suzanne Snyder MRN: [id number] Date of Birth: 25-Jul-1950 No data recorded  Encounter Date: 10/22/2020     Past Medical History:  Diagnosis Date   Anxiety    Arthritis    Asthma    Diabetes mellitus without complication (HCC)    Insulin pump in place    Osteoporosis    RLS (restless legs syndrome)    Smokers' cough (HCC)    Wears dentures    full upper and lower    Past Surgical History:  Procedure Laterality Date   CATARACT EXTRACTION W/PHACO Right 02/20/2020   Procedure: CATARACT EXTRACTION PHACO AND INTRAOCULAR LENS PLACEMENT (IOC) RIGHT 5.60 00:36.3;  Surgeon: Galen Manila, MD;  Location: [facility];  Service: Ophthalmology;  Laterality: Right;  Diabetic - insulin pump Requests not first   CATARACT EXTRACTION W/PHACO Left 03/05/2020   Procedure: CATARACT EXTRACTION PHACO AND INTRAOCULAR LENS PLACEMENT (IOC) LEFT 4.94 00:31.6;  Surgeon: Galen Manila, MD;  Location: [facility];  Service: Ophthalmology;  Laterality: Left;   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     Therex Pulleys flex and abd x15x each direction 3sec holds  Bicep curl seated with elbow 2in from thorax 2# 2x 8 reps Seated single UE at a time  Standing shoulder extension  RTB 2 x 10 ; emphasis on scapular retraction   Standing ER YTB 2 x 8 reps with towel roll at elbow R UE  Y on wall with lift off 1 x 10 rep with cueing to decrease lumbar ext   Bus Driver with horizontal adduction (squeeze) 2# DB 3 x 15 sec cueing to maintain elbow flexion  B UE Pendulums 2x 30 secs               PT  Short Term Goals - 10/08/20 1606       PT SHORT TERM GOAL #1   Title Pt will demonstrate independence with HEP to improve R shoulder function for increased ability to participate with ADLs    Baseline HEP given    Time 4    Status New    Target Date 11/05/20               PT Long Term Goals - 10/08/20 1608       PT LONG TERM GOAL #1   Title Patient will improve R shoulder flex/abd AROM to 160 degrees in sitting without pain greater than 5/10 on NPRS.    Baseline 10/09/20 80d AROM    Time 8    Period Weeks    Status New      PT LONG TERM GOAL #2   Title Patient will improve all R shoulder strength to 4/5 for flexion, abd, and rotation in order to improve ability to perform moderate household chores.    Baseline 10/09/20 3/5 - 3+/5 within availible motion    Time 8    Period Weeks    Status New      PT LONG TERM GOAL #3   Title Patient will increase FOTO score to 58 to demonstrate predicted increase  in functional mobility to complete ADLs    Baseline 10/09/20 49    Time 8    Period Weeks    Status New      PT LONG TERM GOAL #4   Title Pt will decrease worst pain as reported on NPRS by at least 3 points in order to demonstrate clinically significant reduction in pain.    Baseline 10/08/20: 8/10    Time 8    Period Weeks    Status New                     Patient will benefit from skilled therapeutic intervention in order to improve the following deficits and impairments:  Pain, Impaired sensation, Decreased coordination, Decreased mobility, Increased muscle spasms, Decreased activity tolerance, Decreased endurance, Decreased range of motion, Decreased strength, Postural dysfunction, Increased fascial restricitons  Visit Diagnosis: Chronic right shoulder pain     Problem List There are no problems to display for this patient.   Hilda Lias DPT Romilda Joy, SPT  Hilda Lias, PT 10/27/2020, 9:21 PM  [facility] PHYSICAL AND SPORTS MEDICINE [address], [place], [address] Phone: [phone]   Fax:  [phone]  Name: KRYSLYN HELBIG MRN: [id number] Date of Birth: 1950/05/22

## 2020-10-24 ENCOUNTER — Ambulatory Visit: Payer: Medicare Other | Admitting: Physical Therapy

## 2020-10-24 ENCOUNTER — Encounter: Payer: Self-pay | Admitting: Physical Therapy

## 2020-10-24 DIAGNOSIS — G8929 Other chronic pain: Secondary | ICD-10-CM

## 2020-10-24 DIAGNOSIS — M25511 Pain in right shoulder: Secondary | ICD-10-CM | POA: Diagnosis not present

## 2020-10-24 NOTE — Therapy (Signed)
[place] [facility] [address] [place], [place], [address] Phone: [phone]   Fax:  [phone]  Physical Therapy Treatment  Patient Details  Name: Suzanne Snyder MRN: [id number] Date of Birth: [birth year] No data recorded  Encounter Date: 10/24/2020   PT End of Session - 10/24/20 1440     Visit Number 5    Number of Visits 17    Date for PT Re-Evaluation 12/03/20    Authorization Type 5/10    PT Start Time 1435    PT Stop Time 1513    PT Time Calculation (min) 38 min    Activity Tolerance Patient limited by lethargy    Behavior During Therapy [facility] for tasks assessed/performed;Flat affect             Past Medical History:  Diagnosis Date   Anxiety    Arthritis    Asthma    Diabetes mellitus without complication (HCC)    Insulin pump in place    Osteoporosis    RLS (restless legs syndrome)    Smokers' cough (HCC)    Wears dentures    full upper and lower    Past Surgical History:  Procedure Laterality Date   CATARACT EXTRACTION W/PHACO Right 02/20/2020   Procedure: CATARACT EXTRACTION PHACO AND INTRAOCULAR LENS PLACEMENT (IOC) RIGHT 5.60 00:36.3;  Surgeon: Galen Manila, MD;  Location: [facility];  Service: Ophthalmology;  Laterality: Right;  Diabetic - insulin pump Requests not first   CATARACT EXTRACTION W/PHACO Left 03/05/2020   Procedure: CATARACT EXTRACTION PHACO AND INTRAOCULAR LENS PLACEMENT (IOC) LEFT 4.94 00:31.6;  Surgeon: Galen Manila, MD;  Location: [facility];  Service: Ophthalmology;  Laterality: Left;   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   Subjective Assessment - 10/24/20 1437     Subjective Pt reports soreness from last session. She reports her R shoulder pain is a 5/10 pain.    Pertinent  History Suzanne Snyder is a 70 y.o. female who presents to therapy with chronic R shoulder pain. Patient reports increased pain with inability to lift her R arm above shoulder height. Patient reports increased pain with all arm movements with aggravating factors primarily with lifting and carrying. Patient demonstrates decreased ability to perform ADLs such as reaching in a high cabinet, laundry, and preparing food. Pt reports taking tylenol, gabapentin, and tramadol for pain relief. Pt is not currently working, endorses minimal hobbies and interests outside of her home. Pt denies any unexplained weight fluctuation, saddle paresthesia, loss of bowel/bladder function, or unrelenting night pain at this time. Patient has a PMH of L rTSA, chronic L shoulder pain, anxiety, DM I, COPD. Pt is lethagric throughout subjective report, with eyes closed often. Pt also reports she is emotionally distressed d/t her daughter being in a facility and having traffic court, which has caused her to lack sleep in the past few days.    Limitations Lifting;Sitting;Standing;Walking    How long can you sit comfortably? na    How long can you stand comfortably? na    How long can you walk comfortably? na    Diagnostic tests xra    Patient Stated Goals To improve shoulder function and decrease pain  Therex  Pulleys flex and abd x15x each direction 3sec holds   *pt eyes closed throughout subjective and initial exercise still able to respond to questions and demonstrate proper technique of therex   UE Ranger flex to 150 degrees x 10 reps    Y on wall with lift off 1 x 10 rep with cueing to decrease lumbar ext  Tband Row 2 x 10 reps with RTB and emphasis on scapular retraction   Serratus punches 3 x 10 reps  with tactile   PVC Lat pull over 3# 2 x 10 reps   Eccentric shoulder flexion with PVC #3 1 x 5 reps with 4 sec decent                           PT Education - 10/24/20 1610      Education Details therex form/technique    Person(s) Educated Patient    Methods Explanation;Demonstration    Comprehension Verbalized understanding;Returned demonstration              PT Short Term Goals - 10/08/20 1606       PT SHORT TERM GOAL #1   Title Pt will demonstrate independence with HEP to improve R shoulder function for increased ability to participate with ADLs    Baseline HEP given    Time 4    Status New    Target Date 11/05/20               PT Long Term Goals - 10/08/20 1608       PT LONG TERM GOAL #1   Title Patient will improve R shoulder flex/abd AROM to 160 degrees in sitting without pain greater than 5/10 on NPRS.    Baseline 10/09/20 80d AROM    Time 8    Period Weeks    Status New      PT LONG TERM GOAL #2   Title Patient will improve all R shoulder strength to 4/5 for flexion, abd, and rotation in order to improve ability to perform moderate household chores.    Baseline 10/09/20 3/5 - 3+/5 within availible motion    Time 8    Period Weeks    Status New      PT LONG TERM GOAL #3   Title Patient will increase FOTO score to 58 to demonstrate predicted increase in functional mobility to complete ADLs    Baseline 10/09/20 49    Time 8    Period Weeks    Status New      PT LONG TERM GOAL #4   Title Pt will decrease worst pain as reported on NPRS by at least 3 points in order to demonstrate clinically significant reduction in pain.    Baseline 10/08/20: 8/10    Time 8    Period Weeks    Status New                   Plan - 10/24/20 1625     Clinical Impression Statement Pt tolerated session with no reports of increased pain following session. PT continued UE therex focusing on mobility and periscapular strengthening. Pt is able to comply with multimodal cuing for proper technique of therex, with encouragement needed for increased motion. Pt continues to be lethargic as well as  inconsistent with R shoulder pain, strength, and  ROM. Continue PT POC.    Personal Factors and Comorbidities Age;Behavior Pattern;Past/Current Experience;Comorbidity 3+    Comorbidities DM  I, anxiety, COPD, smoking    Examination-Activity Limitations Carry;Sleep;Lift;Dressing    Examination-Participation Restrictions Community Activity;Laundry;Cleaning;Driving    Stability/Clinical Decision Making Evolving/Moderate complexity    Clinical Decision Making Moderate    Rehab Potential Fair    PT Frequency 2x / week    PT Duration 8 weeks    PT Treatment/Interventions Passive range of motion;Manual techniques;Electrical Stimulation;Cryotherapy;Moist Heat;Ultrasound;Therapeutic exercise;Therapeutic activities;Neuromuscular re-education;Patient/family education;Scar mobilization;Dry needling;Iontophoresis 4mg /ml Dexamethasone;Joint Manipulations;Energy conservation;ADLs/Self Care Home Management    PT Next Visit Plan AROM exercises    PT Home Exercise Plan ER/IR, shoulder ext YTB    Consulted and Agree with Plan of Care Patient             Patient will benefit from skilled therapeutic intervention in order to improve the following deficits and impairments:  Pain, Impaired sensation, Decreased coordination, Decreased mobility, Increased muscle spasms, Decreased activity tolerance, Decreased endurance, Decreased range of motion, Decreased strength, Postural dysfunction, Increased fascial restricitons  Visit Diagnosis: Chronic right shoulder pain     Problem List There are no problems to display for this patient.    DPT Hilda Lias, SPT  Romilda Joy, PT 10/25/2020, 9:39 AM  [place] [facility] PHYSICAL AND SPORTS MEDICINE [address], [address], [place] Phone: [phone]   Fax:  [phone]  Name: Suzanne Snyder MRN: [name] Date of Birth: 04-14-1950

## 2020-10-28 ENCOUNTER — Encounter: Payer: Self-pay | Admitting: Physical Therapy

## 2020-10-28 ENCOUNTER — Ambulatory Visit: Payer: Medicare Other | Admitting: Physical Therapy

## 2020-10-28 DIAGNOSIS — M25511 Pain in right shoulder: Secondary | ICD-10-CM

## 2020-10-28 DIAGNOSIS — G8929 Other chronic pain: Secondary | ICD-10-CM

## 2020-10-28 NOTE — Therapy (Signed)
[place] [facility] [address] [place], [place], [address] Phone: [phone]   Fax:  [phone]  Physical Therapy Treatment  Patient Details  Name: Suzanne Snyder MRN: [id number] Date of Birth: 01-04-1951 No data recorded  Encounter Date: 10/28/2020   PT End of Session - 10/28/20 1308     Visit Number 6    Number of Visits 17    Date for PT Re-Evaluation 12/03/20    Authorization Type 5/10    PT Start Time 1300    PT Stop Time 1338    PT Time Calculation (min) 38 min    Activity Tolerance Patient limited by lethargy    Behavior During Therapy [facility] for tasks assessed/performed;Flat affect             Past Medical History:  Diagnosis Date   Anxiety    Arthritis    Asthma    Diabetes mellitus without complication (HCC)    Insulin pump in place    Osteoporosis    RLS (restless legs syndrome)    Smokers' cough (HCC)    Wears dentures    full upper and lower    Past Surgical History:  Procedure Laterality Date   CATARACT EXTRACTION W/PHACO Right 02/20/2020   Procedure: CATARACT EXTRACTION PHACO AND INTRAOCULAR LENS PLACEMENT (IOC) RIGHT 5.60 00:36.3;  Surgeon: Galen Manila, MD;  Location: [facility];  Service: Ophthalmology;  Laterality: Right;  Diabetic - insulin pump Requests not first   CATARACT EXTRACTION W/PHACO Left 03/05/2020   Procedure: CATARACT EXTRACTION PHACO AND INTRAOCULAR LENS PLACEMENT (IOC) LEFT 4.94 00:31.6;  Surgeon: Galen Manila, MD;  Location: [facility];  Service: Ophthalmology;  Laterality: Left;   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   Subjective Assessment - 10/28/20 1302     Subjective Pt reports not being able to lift any thing with her R shoulder. She reports her pain flucuated  between 5-8 on NPRS.    Pertinent History Suzanne Snyder is a 70 y.o. female who presents to therapy with chronic R shoulder pain. Patient reports increased pain with inability to lift her R arm above shoulder height. Patient reports increased pain with all arm movements with aggravating factors primarily with lifting and carrying. Patient demonstrates decreased ability to perform ADLs such as reaching in a high cabinet, laundry, and preparing food. Pt reports taking tylenol, gabapentin, and tramadol for pain relief. Pt is not currently working, endorses minimal hobbies and interests outside of her home. Pt denies any unexplained weight fluctuation, saddle paresthesia, loss of bowel/bladder function, or unrelenting night pain at this time. Patient has a PMH of L rTSA, chronic L shoulder pain, anxiety, DM I, COPD. Pt is lethagric throughout subjective report, with eyes closed often. Pt also reports she is emotionally distressed d/t her daughter being in a facility and having traffic court, which has caused her to lack sleep in the past few days.    Limitations Lifting;Sitting;Standing;Walking    How long can you sit comfortably? na    How long can you stand comfortably? na    How long can you walk comfortably? na    Diagnostic tests xra    Patient Stated Goals To improve shoulder function and decrease pain  Therex    Pulleys flex and abd x15x each direction 3 sec holds   Physioball rolls on wall with R shoulder flex on wall 2 x 10 reps  Physioball rolls on wall abd with R shoulder on wall 1 x 10 reps  Shoulder Extension RTB with emphasis on scapular retraction 2 x 10 reps   Shoulder ER 3 x 10 reps YTB with cues to minimize thoracic rotation with good carry over  Doorway Stretch 3 x 30 sec with cueing for initial set up               PT Education - 10/29/20 1538     Education Details therex form/technique    Person(s) Educated Patient    Methods  Explanation;Demonstration;Verbal cues    Comprehension Verbalized understanding;Returned demonstration;Verbal cues required              PT Short Term Goals - 10/08/20 1606       PT SHORT TERM GOAL #1   Title Pt will demonstrate independence with HEP to improve R shoulder function for increased ability to participate with ADLs    Baseline HEP given    Time 4    Status New    Target Date 11/05/20               PT Long Term Goals - 10/08/20 1608       PT LONG TERM GOAL #1   Title Patient will improve R shoulder flex/abd AROM to 160 degrees in sitting without pain greater than 5/10 on NPRS.    Baseline 10/09/20 80d AROM    Time 8    Period Weeks    Status New      PT LONG TERM GOAL #2   Title Patient will improve all R shoulder strength to 4/5 for flexion, abd, and rotation in order to improve ability to perform moderate household chores.    Baseline 10/09/20 3/5 - 3+/5 within availible motion    Time 8    Period Weeks    Status New      PT LONG TERM GOAL #3   Title Patient will increase FOTO score to 58 to demonstrate predicted increase in functional mobility to complete ADLs    Baseline 10/09/20 49    Time 8    Period Weeks    Status New      PT LONG TERM GOAL #4   Title Pt will decrease worst pain as reported on NPRS by at least 3 points in order to demonstrate clinically significant reduction in pain.    Baseline 10/08/20: 8/10    Time 8    Period Weeks    Status New                   Plan - 10/28/20 1341     Clinical Impression Statement Pt tolerated session with reports of decreased pain following therex. PT continued UE therex focusing on mobility and periscapular strengthening. Pt able to achieve 180 deg R shoulder flexion but displays increased pain and shoulder hikeing with abduction.  Pt is able to comply with multimodal cuing for proper technique of therex with encouragement needed for increased participation. Pt continues to be lethargic as  well as inconsistent with R shoulder pain, strength, and ROM. Continue PT POC.    Personal Factors and Comorbidities Age;Behavior Pattern;Past/Current Experience;Comorbidity 3+    Comorbidities DM I, anxiety, COPD, smoking    Examination-Activity Limitations Carry;Sleep;Lift;Dressing    Examination-Participation Restrictions Community Activity;Laundry;Cleaning;Driving  Stability/Clinical Decision Making Evolving/Moderate complexity    Clinical Decision Making Moderate    Rehab Potential Fair    PT Frequency 2x / week    PT Duration 8 weeks    PT Treatment/Interventions Passive range of motion;Manual techniques;Electrical Stimulation;Cryotherapy;Moist Heat;Ultrasound;Therapeutic exercise;Therapeutic activities;Neuromuscular re-education;Patient/family education;Scar mobilization;Dry needling;Iontophoresis 4mg /ml Dexamethasone;Joint Manipulations;Energy conservation;ADLs/Self Care Home Management    PT Next Visit Plan AROM exercises    PT Home Exercise Plan ER/IR, shoulder ext YTB    Consulted and Agree with Plan of Care Patient             Patient will benefit from skilled therapeutic intervention in order to improve the following deficits and impairments:  Pain, Impaired sensation, Decreased coordination, Decreased mobility, Increased muscle spasms, Decreased activity tolerance, Decreased endurance, Decreased range of motion, Decreased strength, Postural dysfunction, Increased fascial restricitons  Visit Diagnosis: Chronic right shoulder pain     Problem List There are no problems to display for this patient.    DPT Hilda Lias, SPT, Romilda Joy, PT 10/29/2020, 3:41 PM  [place] [facility] PHYSICAL AND SPORTS MEDICINE [address], [address], [place] Phone: [phone]   Fax:  [phone]  Name: Suzanne Snyder MRN: [name] Date of Birth: Feb 05, 1950

## 2020-10-30 ENCOUNTER — Ambulatory Visit: Payer: Medicare Other | Admitting: Physical Therapy

## 2020-11-04 ENCOUNTER — Ambulatory Visit: Payer: Medicare Other | Admitting: Physical Therapy

## 2020-11-04 ENCOUNTER — Encounter: Payer: Self-pay | Admitting: Physical Therapy

## 2020-11-04 DIAGNOSIS — G8929 Other chronic pain: Secondary | ICD-10-CM

## 2020-11-04 DIAGNOSIS — M25511 Pain in right shoulder: Secondary | ICD-10-CM | POA: Diagnosis not present

## 2020-11-04 NOTE — Therapy (Signed)
[place] [facility]TS [facility]ICINE [address] [place], [place], [address] Phone: [phone]   Fax:  [phone]  Physical Therapy Treatment  Patient Details  Name: Suzanne Snyder MRN: [id number] Date of Birth: 12-Oct-1950 No data recorded  Encounter Date: 11/04/2020   PT End of Session - 11/04/20 1259     Visit Number 7    Number of Visits 17    Date for PT Re-Evaluation 12/03/20    Authorization Type 7/10    PT Start Time 1300    PT Stop Time 1340    PT Time Calculation (min) 40 min    Behavior During Therapy [facility] for tasks assessed/performed;Flat affect             Past Medical History:  Diagnosis Date   Anxiety    Arthritis    Asthma    Diabetes mellitus without complication (HCC)    Insulin pump in place    Osteoporosis    RLS (restless legs syndrome)    Smokers' cough (HCC)    Wears dentures    full upper and lower    Past Surgical History:  Procedure Laterality Date   CATARACT EXTRACTION W/PHACO Right 02/20/2020   Procedure: CATARACT EXTRACTION PHACO AND INTRAOCULAR LENS PLACEMENT (IOC) RIGHT 5.60 00:36.3;  Surgeon: Galen Manila, MD;  Location: [facility];  Service: Ophthalmology;  Laterality: Right;  Diabetic - insulin pump Requests not first   CATARACT EXTRACTION W/PHACO Left 03/05/2020   Procedure: CATARACT EXTRACTION PHACO AND INTRAOCULAR LENS PLACEMENT (IOC) LEFT 4.94 00:31.6;  Surgeon: Galen Manila, MD;  Location: [facility];  Service: Ophthalmology;  Laterality: Left;   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   Subjective Assessment - 11/04/20 1300     Subjective Pt reports she had low blood sugar and is the reason she missed the last visit. She reports checking it before attending this visit. She reports her  shoulder is remaining the same since therapy treatment and is still painful rating it 4-7/10.    Pertinent History Suzanne Snyder is a 70 y.o. female who presents to therapy with chronic R shoulder pain. Patient reports increased pain with inability to lift her R arm above shoulder height. Patient reports increased pain with all arm movements with aggravating factors primarily with lifting and carrying. Patient demonstrates decreased ability to perform ADLs such as reaching in a high cabinet, laundry, and preparing food. Pt reports taking tylenol, gabapentin, and tramadol for pain relief. Pt is not currently working, endorses minimal hobbies and interests outside of her home. Pt denies any unexplained weight fluctuation, saddle paresthesia, loss of bowel/bladder function, or unrelenting night pain at this time. Patient has a PMH of L rTSA, chronic L shoulder pain, anxiety, DM I, COPD. Pt is lethagric throughout subjective report, with eyes closed often. Pt also reports she is emotionally distressed d/t her daughter being in a facility and having traffic court, which has caused her to lack sleep in the past few days.    Limitations Lifting;Sitting;Standing;Walking    How long can you sit comfortably? na    How long can you stand comfortably? na    How long can you walk comfortably? na    Patient Stated Goals To improve shoulder  function and decrease pain            Therex      Pulleys flex and abd x15x each direction 3 sec holds    Physioball rolls on wall with R shoulder flex on wall 2 x 10 reps   Total Gym Y, T raises level 26 R UE 2 x 8-10 reps with cueing for scapular retraction and   Total Gym push up plus on knees level 26 2 x 10 reps with emphasis on protraction    Shoulder ER 1 x 10 reps YTB with cues to minimize thoracic rotation with good carry over   Shoulder IR 1 x 10 reps YTB with cues to minimize thoracic rotation with good carry over                               PT Education - 11/04/20 1351     Education Details therex form/technique    Person(s) Educated Patient    Methods Explanation;Demonstration    Comprehension Verbalized understanding;Returned demonstration              PT Short Term Goals - 10/08/20 1606       PT SHORT TERM GOAL #1   Title Pt will demonstrate independence with HEP to improve R shoulder function for increased ability to participate with ADLs    Baseline HEP given    Time 4    Status New    Target Date 11/05/20               PT Long Term Goals - 10/08/20 1608       PT LONG TERM GOAL #1   Title Patient will improve R shoulder flex/abd AROM to 160 degrees in sitting without pain greater than 5/10 on NPRS.    Baseline 10/09/20 80d AROM    Time 8    Period Weeks    Status New      PT LONG TERM GOAL #2   Title Patient will improve all R shoulder strength to 4/5 for flexion, abd, and rotation in order to improve ability to perform moderate household chores.    Baseline 10/09/20 3/5 - 3+/5 within availible motion    Time 8    Period Weeks    Status New      PT LONG TERM GOAL #3   Title Patient will increase FOTO score to 58 to demonstrate predicted increase in functional mobility to complete ADLs    Baseline 10/09/20 49    Time 8    Period Weeks    Status New      PT LONG TERM GOAL #4   Title Pt will decrease worst pain as reported on NPRS by at least 3 points in order to demonstrate clinically significant reduction in pain.    Baseline 10/08/20: 8/10    Time 8    Period Weeks    Status New                   Plan - 11/04/20 1342     Clinical Impression Statement Pt tolerated session well with ability to tolerate therex with less reports of pain throughout. PT continued UE therex focusing on mobility and periscapular strengthening with difficulty and increased pain primariy with R internal/external shoulder rotation. Pt is able to comply  with multimodal cuing for proper technique of therex with encouragement needed for increased participation. Continue PT POC.  Personal Factors and Comorbidities Age;Behavior Pattern;Past/Current Experience;Comorbidity 3+    Comorbidities DM I, anxiety, COPD, smoking    Examination-Activity Limitations Carry;Sleep;Lift;Dressing    Examination-Participation Restrictions Community Activity;Laundry;Cleaning;Driving    Stability/Clinical Decision Making Evolving/Moderate complexity    Clinical Decision Making Moderate    Rehab Potential Fair    PT Frequency 2x / week    PT Duration 8 weeks    PT Treatment/Interventions Passive range of motion;Manual techniques;Electrical Stimulation;Cryotherapy;Moist Heat;Ultrasound;Therapeutic exercise;Therapeutic activities;Neuromuscular re-education;Patient/family education;Scar mobilization;Dry needling;Iontophoresis 4mg /ml Dexamethasone;Joint Manipulations;Energy conservation;ADLs/Self Care Home Management    PT Next Visit Plan AROM exercises    PT Home Exercise Plan ER/IR, shoulder ext YTB    Consulted and Agree with Plan of Care Patient             Patient will benefit from skilled therapeutic intervention in order to improve the following deficits and impairments:  Pain, Impaired sensation, Decreased coordination, Decreased mobility, Increased muscle spasms, Decreased activity tolerance, Decreased endurance, Decreased range of motion, Decreased strength, Postural dysfunction, Increased fascial restricitons  Visit Diagnosis: Chronic right shoulder pain     Problem List There are no problems to display for this patient.    DPT Hilda Lias, SPT  Romilda Joy, PT 11/05/2020, 11:48 AM  [place] [facility] PHYSICAL AND SPORTS MEDICINE [address], [place] Phone: [phone]   Fax:  [phone]  Name: Suzanne Snyder MRN: [name] Date of Birth: 1950/06/17

## 2020-11-06 ENCOUNTER — Encounter: Payer: Self-pay | Admitting: Physical Therapy

## 2020-11-06 ENCOUNTER — Ambulatory Visit: Payer: Medicare Other | Admitting: Physical Therapy

## 2020-11-06 DIAGNOSIS — M25511 Pain in right shoulder: Secondary | ICD-10-CM | POA: Diagnosis not present

## 2020-11-06 DIAGNOSIS — M6281 Muscle weakness (generalized): Secondary | ICD-10-CM

## 2020-11-06 NOTE — Therapy (Signed)
[place] [facility]ORTS MEDICINE [address] [place], [place], [address] Phone: [phone]   Fax:  [phone]  Physical Therapy Treatment  Patient Details  Name: Suzanne Snyder MRN: [id number] Date of Birth: Jun 29, 1950 No data recorded  Encounter Date: 11/06/2020   PT End of Session - 11/06/20 1318     Visit Number 8    Number of Visits 17    Date for PT Re-Evaluation 12/03/20    Authorization Type 8/10    PT Start Time 1303    PT Stop Time 1343    PT Time Calculation (min) 40 min    Activity Tolerance Patient limited by lethargy    Behavior During Therapy [facility] for tasks assessed/performed;Flat affect             Past Medical History:  Diagnosis Date   Anxiety    Arthritis    Asthma    Diabetes mellitus without complication (HCC)    Insulin pump in place    Osteoporosis    RLS (restless legs syndrome)    Smokers' cough (HCC)    Wears dentures    full upper and lower    Past Surgical History:  Procedure Laterality Date   CATARACT EXTRACTION W/PHACO Right 02/20/2020   Procedure: CATARACT EXTRACTION PHACO AND INTRAOCULAR LENS PLACEMENT (IOC) RIGHT 5.60 00:36.3;  Surgeon: Galen Manila, MD;  Location: [facility];  Service: Ophthalmology;  Laterality: Right;  Diabetic - insulin pump Requests not first   CATARACT EXTRACTION W/PHACO Left 03/05/2020   Procedure: CATARACT EXTRACTION PHACO AND INTRAOCULAR LENS PLACEMENT (IOC) LEFT 4.94 00:31.6;  Surgeon: Galen Manila, MD;  Location: [facility];  Service: Ophthalmology;  Laterality: Left;   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   Subjective Assessment - 11/06/20 1309     Subjective Patient reports she is having increased pain with R shoulder today, reporting she was pulling a lot of  things out from under bed looking for a bottle of medicine she presumes her daughter stole. Reports pain in shoulder is 7/10 today. Reports compliance with HEP    Pertinent History Suzanne Snyder is a 70 y.o. female who presents to therapy with chronic R shoulder pain. Patient reports increased pain with inability to lift her R arm above shoulder height. Patient reports increased pain with all arm movements with aggravating factors primarily with lifting and carrying. Patient demonstrates decreased ability to perform ADLs such as reaching in a high cabinet, laundry, and preparing food. Pt reports taking tylenol, gabapentin, and tramadol for pain relief. Pt is not currently working, endorses minimal hobbies and interests outside of her home. Pt denies any unexplained weight fluctuation, saddle paresthesia, loss of bowel/bladder function, or unrelenting night pain at this time. Patient has a PMH of L rTSA, chronic L shoulder pain, anxiety, DM I, COPD. Pt is lethagric throughout subjective report, with eyes closed often. Pt also reports she is emotionally distressed d/t her daughter being in a facility and having traffic court, which has caused her to lack sleep in the past few days.    Limitations Lifting;Sitting;Standing;Walking    How long can you sit comfortably? na    How long can you stand comfortably? na    How long can you  walk comfortably? na    Diagnostic tests xra    Patient Stated Goals To improve shoulder function and decrease pain    Pain Onset More than a month ago            Therex   Pulleys flex and abd x15x each direction 3 sec holds- full AAROM with these   Fwd raise to 90d 2x 5/6 with 1# DB min cuing for scapulohumeral rhythm without excessive protraction with good carry over Lateral raise to 90d with 1# DB x5; BW x6 with min cuing to prevent lateral bend compensation  Bilat ER with YTB sitting 2x 10 with min cuing for scapular retractions and eccentric control with good carry  over   High row YTB x10; RTB 2x 10 with good carry over of cuing for eccentric control   Omega chest/forward press 5# 2x 7/5 with min cuing for even press from RUE and LUE and preventing bilat scapular protraction with success                PT Education - 11/06/20 1313     Education Details therex form/technique    Person(s) Educated Patient    Methods Explanation;Demonstration;Verbal cues    Comprehension Verbalized understanding;Returned demonstration;Verbal cues required              PT Short Term Goals - 10/08/20 1606       PT SHORT TERM GOAL #1   Title Pt will demonstrate independence with HEP to improve R shoulder function for increased ability to participate with ADLs    Baseline HEP given    Time 4    Status New    Target Date 11/05/20               PT Long Term Goals - 10/08/20 1608       PT LONG TERM GOAL #1   Title Patient will improve R shoulder flex/abd AROM to 160 degrees in sitting without pain greater than 5/10 on NPRS.    Baseline 10/09/20 80d AROM    Time 8    Period Weeks    Status New      PT LONG TERM GOAL #2   Title Patient will improve all R shoulder strength to 4/5 for flexion, abd, and rotation in order to improve ability to perform moderate household chores.    Baseline 10/09/20 3/5 - 3+/5 within availible motion    Time 8    Period Weeks    Status New      PT LONG TERM GOAL #3   Title Patient will increase FOTO score to 58 to demonstrate predicted increase in functional mobility to complete ADLs    Baseline 10/09/20 49    Time 8    Period Weeks    Status New      PT LONG TERM GOAL #4   Title Pt will decrease worst pain as reported on NPRS by at least 3 points in order to demonstrate clinically significant reduction in pain.    Baseline 10/08/20: 8/10    Time 8    Period Weeks    Status New                   Plan - 11/06/20 1321     Clinical Impression Statement PT continued therex progression for  increased shoulder mobility and strength with success. Patient demonstrates full AROM following AAROM pulleys therex, though she does report this is painful and difficult with lowering. PT continued therex  progression for increased strengthing with patient able to complete all therex with good carry over cuing for proper technique, most difficulty with lateral lifting. PT will continue progression as able.    Personal Factors and Comorbidities Age;Behavior Pattern;Past/Current Experience;Comorbidity 3+    Comorbidities DM I, anxiety, COPD, smoking    Examination-Activity Limitations Carry;Sleep;Lift;Dressing    Examination-Participation Restrictions Community Activity;Laundry;Cleaning;Driving    Stability/Clinical Decision Making Evolving/Moderate complexity    Clinical Decision Making Moderate    Rehab Potential Fair    Clinical Impairments Affecting Rehab Potential (+) highly motivated, (-) age, decreased strength    PT Frequency 2x / week    PT Duration 8 weeks    PT Treatment/Interventions Passive range of motion;Manual techniques;Electrical Stimulation;Cryotherapy;Moist Heat;Ultrasound;Therapeutic exercise;Therapeutic activities;Neuromuscular re-education;Patient/family education;Scar mobilization;Dry needling;Iontophoresis 4mg /ml Dexamethasone;Joint Manipulations;Energy conservation;ADLs/Self Care Home Management    PT Next Visit Plan AROM exercises    PT Home Exercise Plan ER/IR, shoulder ext YTB    Consulted and Agree with Plan of Care Patient             Patient will benefit from skilled therapeutic intervention in order to improve the following deficits and impairments:  Pain, Impaired sensation, Decreased coordination, Decreased mobility, Increased muscle spasms, Decreased activity tolerance, Decreased endurance, Decreased range of motion, Decreased strength, Postural dysfunction, Increased fascial restricitons  Visit Diagnosis: Chronic right shoulder pain  Muscle weakness  (generalized)     Problem List There are no problems to display for this patient.  DPT Hilda Lias, PT 11/06/2020, 1:50 PM  [place] [facility] PHYSICAL AND SPORTS MEDICINE [address], [place] Phone: [phone]   Fax:  [phone]  Name: ELLIANNA RUEST MRN: [name] Date of Birth: 02-23-1950

## 2020-11-11 ENCOUNTER — Encounter: Payer: Self-pay | Admitting: Physical Therapy

## 2020-11-11 ENCOUNTER — Ambulatory Visit: Payer: Medicare Other | Admitting: Physical Therapy

## 2020-11-11 DIAGNOSIS — M25511 Pain in right shoulder: Secondary | ICD-10-CM | POA: Diagnosis not present

## 2020-11-11 DIAGNOSIS — G8929 Other chronic pain: Secondary | ICD-10-CM
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
## 2020-11-13 ENCOUNTER — Encounter: Payer: Self-pay | Admitting: Physical Therapy

## 2020-11-13 ENCOUNTER — Ambulatory Visit: Payer: Medicare Other | Attending: Student | Admitting: Physical Therapy

## 2020-11-13 DIAGNOSIS — M25511 Pain in right shoulder: Secondary | ICD-10-CM | POA: Diagnosis not present

## 2020-11-13 DIAGNOSIS — G8929 Other chronic pain: Secondary | ICD-10-CM | POA: Diagnosis present

## 2020-11-13 DIAGNOSIS — M6281 Muscle weakness (generalized): Secondary | ICD-10-CM | POA: Insufficient documentation

## 2020-11-13 NOTE — Therapy (Signed)
[place] [facility] [address] [place], [place], [address] Phone: [phone]   Fax:  [phone]  Physical Therapy Treatment Progress Report Reporting Period 10/09/20-11/13/20  Patient Details  Name: Suzanne Snyder MRN: [id number] Date of Birth: 1950-09-19 No data recorded  Encounter Date: 11/13/2020   PT End of Session - 11/13/20 1302     Visit Number 10    Number of Visits 17    Date for PT Re-Evaluation 12/03/20    Authorization Type 10/10    PT Start Time 1302    PT Stop Time 1345    PT Time Calculation (min) 43 min    Activity Tolerance Patient limited by lethargy    Behavior During Therapy [facility] for tasks assessed/performed;Flat affect             Past Medical History:  Diagnosis Date   Anxiety    Arthritis    Asthma    Diabetes mellitus without complication (HCC)    Insulin pump in place    Osteoporosis    RLS (restless legs syndrome)    Smokers' cough (HCC)    Wears dentures    full upper and lower    Past Surgical History:  Procedure Laterality Date   CATARACT EXTRACTION W/PHACO Right 02/20/2020   Procedure: CATARACT EXTRACTION PHACO AND INTRAOCULAR LENS PLACEMENT (IOC) RIGHT 5.60 00:36.3;  Surgeon: Galen Manila, MD;  Location: [facility];  Service: Ophthalmology;  Laterality: Right;  Diabetic - insulin pump Requests not first   CATARACT EXTRACTION W/PHACO Left 03/05/2020   Procedure: CATARACT EXTRACTION PHACO AND INTRAOCULAR LENS PLACEMENT (IOC) LEFT 4.94 00:31.6;  Surgeon: Galen Manila, MD;  Location: [facility];  Service: Ophthalmology;  Laterality: Left;   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   Subjective Assessment - 11/13/20 1304     Subjective Pt reports feeling no worse than before. Pt  continues to have pain that is intermittant. Pt reports feeling 4-7/10 NPRS.    Pertinent History Suzanne Snyder is a 70 y.o. female who presents to therapy with chronic R shoulder pain. Patient reports increased pain with inability to lift her R arm above shoulder height. Patient reports increased pain with all arm movements with aggravating factors primarily with lifting and carrying. Patient demonstrates decreased ability to perform ADLs such as reaching in a high cabinet, laundry, and preparing food. Pt reports taking tylenol, gabapentin, and tramadol for pain relief. Pt is not currently working, endorses minimal hobbies and interests outside of her home. Pt denies any unexplained weight fluctuation, saddle paresthesia, loss of bowel/bladder function, or unrelenting night pain at this time. Patient has a PMH of L rTSA, chronic L shoulder pain, anxiety, DM I, COPD. Pt is lethagric throughout subjective report, with eyes closed often. Pt also reports she is emotionally distressed d/t her daughter being in a facility and having traffic court, which has caused her to lack sleep in the past few days.    Limitations Lifting;Sitting;Standing;Walking    How long can you sit comfortably? na    How long can you stand comfortably? na    How long can you walk comfortably? na    Diagnostic tests xray    Patient Stated Goals To improve shoulder function and  decrease pain             Therex   MMT R shoulder  4/5 flex, 4-/5 abd, 4-/ ER, 4+/5 IIR  AROM  R shoulder  Flex 170 deg  ABD 170 deg  R shoulder Snow angles (shoulder ABD) on wall 1 x 6 reps   AROM Scaption with hand slides on OMEGA 1 x 10 reps   Review of HEP, patient able to demonstrate and verbalize understanding  Manual therapy:  Pec minor pin and strech 3 x 30 sec   LAD R shoulder 3 x 30sec  G2 lateral mobilzation to R GH joint 3 bouts x 30 sec  *manual therapy performed in supine for increased tissue extensibility and pain  modulation.    *Patient educated on progress in R shoulder AROM and R shoulder strength.                         PT Short Term Goals - 11/14/20 1315       PT SHORT TERM GOAL #1   Title Pt will demonstrate independence with HEP to improve R shoulder function for increased ability to participate with ADLs    Baseline HEP given    Time 4    Period Weeks    Status Achieved               PT Long Term Goals - 11/13/20 1306       PT LONG TERM GOAL #1   Title Patient will improve R shoulder flex/abd AROM to 160 degrees in sitting without pain greater than 5/10 on NPRS.    Baseline 10/09/20 80d AROM 11/13/20: 170 deg AROM with 5/10 pain    Time 8    Period Weeks    Status Achieved      PT LONG TERM GOAL #2   Title Patient will improve all R shoulder strength to 4/5 for flexion, abd, and rotation in order to improve ability to perform moderate household chores.    Baseline 10/09/20 3/5 - 3+/5 within availible motion 11/13/20: 4/5 flex, 4-/5 abd, 4-/ ER, 4+/5 IIR    Time 8    Period Weeks    Status On-going      PT LONG TERM GOAL #3   Title Patient will increase FOTO score to 58 to demonstrate predicted increase in functional mobility to complete ADLs    Baseline 10/09/20 49 11/13/20: 61    Period Weeks    Status Achieved      PT LONG TERM GOAL #4   Title Pt will decrease worst pain as reported on NPRS by at least 3 points in order to demonstrate clinically significant reduction in pain.    Baseline 10/08/20: 8/10 11/13/20: 7/10    Time 8    Period Weeks    Status On-going                   Plan - 11/13/20 1600     Clinical Impression Statement PT progress note performed this date. Pt demonstrated increase in R shoulder AROM and R shoulder strength in all planes. Continued impairments in shoulder strength needed for heavy ADLs, and pain. Pt tolerated session well with no reports of increased pain following manual therapy and exericse. Pt is able to  comply with multimodal cuing for proper technique of therex. Pt educated on progress with therapy despite intermitent increases in pain. Continue PT POC as able.    Personal Factors and  Comorbidities Age;Behavior Pattern;Past/Current Experience;Comorbidity 3+    Comorbidities DM I, anxiety, COPD, smoking    Examination-Activity Limitations Carry;Sleep;Lift;Dressing    Examination-Participation Restrictions Community Activity;Laundry;Cleaning;Driving    Stability/Clinical Decision Making Evolving/Moderate complexity    Clinical Decision Making Moderate    Rehab Potential Fair    PT Frequency 2x / week    PT Duration 8 weeks    PT Treatment/Interventions Passive range of motion;Manual techniques;Electrical Stimulation;Cryotherapy;Moist Heat;Ultrasound;Therapeutic exercise;Therapeutic activities;Neuromuscular re-education;Patient/family education;Scar mobilization;Dry needling;Iontophoresis 4mg /ml Dexamethasone;Joint Manipulations;Energy conservation;ADLs/Self Care Home Management    PT Next Visit Plan AROM exercises    PT Home Exercise Plan ER/IR, shoulder ext YTB    Consulted and Agree with Plan of Care Patient             Patient will benefit from skilled therapeutic intervention in order to improve the following deficits and impairments:  Pain, Impaired sensation, Decreased coordination, Decreased mobility, Increased muscle spasms, Decreased activity tolerance, Decreased endurance, Decreased range of motion, Decreased strength, Postural dysfunction, Increased fascial restricitons  Visit Diagnosis: Chronic right shoulder pain     Problem List There are no problems to display for this patient.   DPT Hilda Lias, SPT  Romilda Joy, PT 11/14/2020, 1:20 PM  [place] [facility] PHYSICAL AND SPORTS MEDICINE [address], [place] Phone: [phone]   Fax:  [phone]  Name: Suzanne Snyder MRN: [name] Date  of Birth: 07-30-1950

## 2020-11-18 ENCOUNTER — Ambulatory Visit: Payer: Medicare Other | Admitting: Physical Therapy

## 2020-11-18 ENCOUNTER — Encounter: Payer: Self-pay | Admitting: Physical Therapy

## 2020-11-18 DIAGNOSIS — M25511 Pain in right shoulder: Secondary | ICD-10-CM | POA: Diagnosis not present

## 2020-11-18 DIAGNOSIS — G8929 Other chronic pain: Secondary | ICD-10-CM
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
## 2020-11-20 ENCOUNTER — Ambulatory Visit: Payer: Medicare Other | Admitting: Physical Therapy

## 2020-11-25 ENCOUNTER — Ambulatory Visit: Payer: Medicare Other | Admitting: Physical Therapy

## 2020-11-25 ENCOUNTER — Encounter: Payer: Self-pay | Admitting: Physical Therapy

## 2020-11-25 DIAGNOSIS — M25511 Pain in right shoulder: Secondary | ICD-10-CM

## 2020-11-25 DIAGNOSIS — G8929 Other chronic pain: Secondary | ICD-10-CM

## 2020-11-25 NOTE — Therapy (Signed)
[facility] [address] [place], [place], [address] Phone: [phone]   Fax:  [phone]  Physical Therapy Treatment  Patient Details  Name: Suzanne Snyder MRN: [id number] Date of Birth: [birth year] No data recorded  Encounter Date: 11/25/2020   Suzanne Snyder End of Session - 11/25/20 1308     Visit Number 12    Number of Visits 17    Date for Suzanne Snyder Re-Evaluation 12/03/20    Authorization Type 2/10    Suzanne Snyder Start Time 1302    Suzanne Snyder Stop Time 1342    Suzanne Snyder Time Calculation (min) 40 min    Activity Tolerance Patient tolerated treatment well    Behavior During Therapy Suzanne Snyder for tasks assessed/performed;Flat affect             Past Medical History:  Diagnosis Date   Anxiety    Arthritis    Asthma    Diabetes mellitus without complication (HCC)    Insulin pump in place    Osteoporosis    RLS (restless legs syndrome)    Smokers' cough (HCC)    Wears dentures    full upper and lower    Past Surgical History:  Procedure Laterality Date   CATARACT EXTRACTION W/PHACO Right 02/20/2020   Procedure: CATARACT EXTRACTION PHACO AND INTRAOCULAR LENS PLACEMENT (IOC) RIGHT 5.60 00:36.3;  Surgeon: Galen Manila, MD;  Location: 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   Subjective Assessment - 11/25/20 1306     Subjective Suzanne Snyder reports to therapy with increased reports of R shoulder pain. She reports having inability to  find comforable position or pain relief.    Pertinent History Suzanne Snyder is a 70 y.o. female who presents to therapy with chronic R shoulder pain. Patient reports increased pain with inability to lift her R arm above shoulder height. Patient reports increased pain with all arm movements with aggravating factors primarily with lifting and carrying. Patient demonstrates decreased ability to perform ADLs such as reaching in a high cabinet, laundry, and preparing food. Suzanne Snyder reports taking tylenol, gabapentin, and tramadol for pain relief. Suzanne Snyder is not currently working, endorses minimal hobbies and interests outside of her home. Suzanne Snyder denies any unexplained weight fluctuation, saddle paresthesia, loss of bowel/bladder function, or unrelenting night pain at this time. Patient has a PMH of L rTSA, chronic L shoulder pain, anxiety, DM I, COPD. Suzanne Snyder is lethagric throughout subjective report, with eyes closed often. Suzanne Snyder also reports she is emotionally distressed d/t her daughter being in a facility and having traffic court, which has caused her to lack sleep in the past few days.    Limitations Lifting;Sitting;Standing;Walking    How long can you sit comfortably? na    How long can you stand comfortably? na    How long can you walk comfortably? na    Diagnostic tests xray    Patient Stated Goals To improve shoulder function and decrease pain  Therex   UBE L1 x 2 min FWD/BWD seat 10    AROM R Shoulder Flex slides up wall 2 x 10 reps   [place] on wall 1 x 6 reps with patient reports of increased R shoulder pain    Y Foam Roller Raises 2 x 10 reps with emphasis on scapular protraction    Seated Lat Pull Down GTB 2 x 10 reps with cues to perform scapular retraction and eccentric control with release  TRX Row 2 x 10 with cues to perform scapular retraction and for standing posture  Omega Bicep curls 1 x 10 reps #5 with cueing for eccentric control of weight                 Suzanne Snyder  Education - 11/25/20 1355     Education Details therex form/technique    Person(s) Educated Patient    Methods Explanation;Demonstration;Tactile cues    Comprehension Verbalized understanding;Returned demonstration;Tactile cues required              Suzanne Snyder Short Term Goals - 11/14/20 1315       Suzanne Snyder SHORT TERM GOAL #1   Title Suzanne Snyder will demonstrate independence with HEP to improve R shoulder function for increased ability to participate with ADLs    Baseline HEP given    Time 4    Period Weeks    Status Achieved               Suzanne Snyder Long Term Goals - 11/13/20 1306       Suzanne Snyder LONG TERM GOAL #1   Title Patient will improve R shoulder flex/abd AROM to 160 degrees in sitting without pain greater than 5/10 on NPRS.    Baseline 10/09/20 80d AROM 11/13/20: 170 deg AROM with 5/10 pain    Time 8    Period Weeks    Status Achieved      Suzanne Snyder LONG TERM GOAL #2   Title Patient will improve all R shoulder strength to 4/5 for flexion, abd, and rotation in order to improve ability to perform moderate household chores.    Baseline 10/09/20 3/5 - 3+/5 within availible motion 11/13/20: 4/5 flex, 4-/5 abd, 4-/ ER, 4+/5 IIR    Time 8    Period Weeks    Status On-going      Suzanne Snyder LONG TERM GOAL #3   Title Patient will increase FOTO score to 58 to demonstrate predicted increase in functional mobility to complete ADLs    Baseline 10/09/20 49 11/13/20: 61    Period Weeks    Status Achieved      Suzanne Snyder LONG TERM GOAL #4   Title Suzanne Snyder will decrease worst pain as reported on NPRS by at least 3 points in order to demonstrate clinically significant reduction in pain.    Baseline 10/08/20: 8/10 11/13/20: 7/10    Time 8    Period Weeks    Status On-going                   Plan - 11/25/20 1525     Clinical Impression Statement Suzanne Snyder tolerated session well with reports of decreased R shoulder pain following therapeutic exercise. Suzanne Snyder continued R shoulder and periscapular strengthening with difficulty and increased  pain with abduction. Suzanne Snyder is able to comply with multimodal cuing for proper technique of therex. Suzanne Snyder encouraged to continue HEP and to keep object close to person when lifting. Continue Suzanne Snyder POC as able.    Personal Factors and Comorbidities Age;Behavior Pattern;Past/Current Experience;Comorbidity  3+    Comorbidities DM I, anxiety, COPD, smoking    Examination-Activity Limitations Carry;Sleep;Lift;Dressing    Examination-Participation Restrictions Community Activity;Laundry;Cleaning;Driving    Stability/Clinical Decision Making Evolving/Moderate complexity    Clinical Decision Making Moderate    Rehab Potential Fair    Clinical Impairments Affecting Rehab Potential (+) highly motivated, (-) age, decreased strength    Suzanne Snyder Frequency 2x / week    Suzanne Snyder Duration 8 weeks    Suzanne Snyder Treatment/Interventions Passive range of motion;Manual techniques;Electrical Stimulation;Cryotherapy;Moist Heat;Ultrasound;Therapeutic exercise;Therapeutic activities;Neuromuscular re-education;Patient/family education;Scar mobilization;Dry needling;Iontophoresis 4mg /ml Dexamethasone;Joint Manipulations;Energy conservation;ADLs/Self Care Home Management    Suzanne Snyder Next Visit Plan AROM exercises    Suzanne Snyder Home Exercise Plan ER/IR, shoulder ext YTB    Consulted and Agree with Plan of Care Patient             Patient will benefit from skilled therapeutic intervention in order to improve the following deficits and impairments:  Pain, Impaired sensation, Decreased coordination, Decreased mobility, Increased muscle spasms, Decreased activity tolerance, Decreased endurance, Decreased range of motion, Decreased strength, Postural dysfunction, Increased fascial restricitons  Visit Diagnosis: Chronic right shoulder pain     Problem List There are no problems to display for this patient.    DPT Suzanne Snyder, Suzanne Snyder  Suzanne Snyder, Suzanne Snyder 11/26/2020, 11:02 AM  [place] [facility]  [facility]ICINE [address], [address], [place] Phone: [phone]   Fax:  [phone]  Name: Suzanne Snyder MRN: [name] Date of Birth: 27-Oct-1950

## 2020-11-27 ENCOUNTER — Ambulatory Visit: Payer: Medicare Other | Admitting: Physical Therapy

## 2020-11-28 ENCOUNTER — Ambulatory Visit: Payer: Medicare Other | Admitting: Physical Therapy

## 2020-11-28 ENCOUNTER — Encounter: Payer: Self-pay | Admitting: Physical Therapy

## 2020-11-28 DIAGNOSIS — G8929 Other chronic pain: Secondary | ICD-10-CM

## 2020-11-28 DIAGNOSIS — M25511 Pain in right shoulder: Secondary | ICD-10-CM | POA: Diagnosis not present

## 2020-11-29 NOTE — Therapy (Signed)
[facility] [address] [place], [place], [address] Phone: [phone]   Fax:  [phone]  Physical Therapy Treatment  Patient Details  Name: Suzanne Snyder MRN: [id number] Date of Birth: [birth year] No data recorded  Encounter Date: 11/28/2020   PT End of Session - 11/28/20 1350     Visit Number 13    Number of Visits 17    Date for PT Re-Evaluation 12/03/20    Authorization Type 3/10    PT Start Time 1346    PT Stop Time 1424    PT Time Calculation (min) 38 min    Activity Tolerance Patient tolerated treatment well    Behavior During Therapy WFL for tasks assessed/performed             Past Medical History:  Diagnosis Date   Anxiety    Arthritis    Asthma    Diabetes mellitus without complication (HCC)    Insulin pump in place    Osteoporosis    RLS (restless legs syndrome)    Smokers' cough (HCC)    Wears dentures    full upper and lower    Past Surgical History:  Procedure Laterality Date   CATARACT EXTRACTION W/PHACO Right 02/20/2020   Procedure: CATARACT EXTRACTION PHACO AND INTRAOCULAR LENS PLACEMENT (IOC) RIGHT 5.60 00:36.3;  Surgeon: Galen Manila, MD;  Location: [facility];  Service: Ophthalmology;  Laterality: Right;  Diabetic - insulin pump Requests not first   CATARACT EXTRACTION W/PHACO Left 03/05/2020   Procedure: CATARACT EXTRACTION PHACO AND INTRAOCULAR LENS PLACEMENT (IOC) LEFT 4.94 00:31.6;  Surgeon: Galen Manila, MD;  Location: [facility];  Service: Ophthalmology;  Laterality: Left;   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   Subjective Assessment - 11/28/20 1353     Subjective Pt reports having "good and bad days" in regards to her R shoulder pain.    Pertinent History Suzanne Snyder  is a 70 y.o. female who presents to therapy with chronic R shoulder pain. Patient reports increased pain with inability to lift her R arm above shoulder height. Patient reports increased pain with all arm movements with aggravating factors primarily with lifting and carrying. Patient demonstrates decreased ability to perform ADLs such as reaching in a high cabinet, laundry, and preparing food. Pt reports taking tylenol, gabapentin, and tramadol for pain relief. Pt is not currently working, endorses minimal hobbies and interests outside of her home. Pt denies any unexplained weight fluctuation, saddle paresthesia, loss of bowel/bladder function, or unrelenting night pain at this time. Patient has a PMH of L rTSA, chronic L shoulder pain, anxiety, DM I, COPD. Pt is lethagric throughout subjective report, with eyes closed often. Pt also reports she is emotionally distressed d/t her daughter being in a facility and having traffic court, which has caused her to lack sleep in the past few days.    Limitations Lifting;Sitting;Standing;Walking    How long can you sit comfortably? na    How long can you stand comfortably? na    How long can you walk comfortably? na    Diagnostic tests xray    Patient Stated Goals To improve shoulder function and decrease pain  Therex   UBE L2 x 2 min FWD/BWD seat 10    AROM R Shoulder Flex slides up wall 2 x 10 reps    Scaption angels on wall 2 x 10 reps in    Triceps press down 3 x 10 reps GTB with cues to keep elbows tucked to side   OMEGA Lat Pull Down supinated grip 1 x 10 #15#; 1 x 10 #20 reps with cues to perform scapular retraction and eccentric control with release  W raise R UE 1 x 10 reps without wt; 2 x 10 reps 1#DB with cues for scapular retraction                            PT Education - 11/28/20 1349     Education Details therex form/technique    Person(s) Educated Patient    Methods Demonstration;Explanation     Comprehension Verbalized understanding;Returned demonstration              PT Short Term Goals - 11/14/20 1315       PT SHORT TERM GOAL #1   Title Pt will demonstrate independence with HEP to improve R shoulder function for increased ability to participate with ADLs    Baseline HEP given    Time 4    Period Weeks    Status Achieved               PT Long Term Goals - 11/13/20 1306       PT LONG TERM GOAL #1   Title Patient will improve R shoulder flex/abd AROM to 160 degrees in sitting without pain greater than 5/10 on NPRS.    Baseline 10/09/20 80d AROM 11/13/20: 170 deg AROM with 5/10 pain    Time 8    Period Weeks    Status Achieved      PT LONG TERM GOAL #2   Title Patient will improve all R shoulder strength to 4/5 for flexion, abd, and rotation in order to improve ability to perform moderate household chores.    Baseline 10/09/20 3/5 - 3+/5 within availible motion 11/13/20: 4/5 flex, 4-/5 abd, 4-/ ER, 4+/5 IIR    Time 8    Period Weeks    Status On-going      PT LONG TERM GOAL #3   Title Patient will increase FOTO score to 58 to demonstrate predicted increase in functional mobility to complete ADLs    Baseline 10/09/20 49 11/13/20: 61    Period Weeks    Status Achieved      PT LONG TERM GOAL #4   Title Pt will decrease worst pain as reported on NPRS by at least 3 points in order to demonstrate clinically significant reduction in pain.    Baseline 10/08/20: 8/10 11/13/20: 7/10    Time 8    Period Weeks    Status On-going                   Plan - 11/28/20 1536     Clinical Impression Statement Pt tolerated session well with reports of decreased R shoulder pain following therapeutic exercise. PT continued R shoulder and periscapular strengthening. Pt is able to comply with multimodal cuing for proper technique of therex. Pt encouraged to continue to keep object close to person when lifting and modifying activities for decreased R pain. Continue PT POC  as able.    Personal Factors and Comorbidities Age;Behavior Pattern;Past/Current Experience;Comorbidity 3+  Comorbidities DM I, anxiety, COPD, smoking    Examination-Activity Limitations Carry;Sleep;Lift;Dressing    Examination-Participation Restrictions Community Activity;Laundry;Cleaning;Driving    Stability/Clinical Decision Making Evolving/Moderate complexity    Clinical Decision Making Moderate    Rehab Potential Fair    Clinical Impairments Affecting Rehab Potential (+) highly motivated, (-) age, decreased strength    PT Frequency 2x / week    PT Duration 8 weeks    PT Treatment/Interventions Passive range of motion;Manual techniques;Electrical Stimulation;Cryotherapy;Moist Heat;Ultrasound;Therapeutic exercise;Therapeutic activities;Neuromuscular re-education;Patient/family education;Scar mobilization;Dry needling;Iontophoresis 4mg /ml Dexamethasone;Joint Manipulations;Energy conservation;ADLs/Self Care Home Management    PT Next Visit Plan AROM exercises    PT Home Exercise Plan ER/IR, shoulder ext YTB    Consulted and Agree with Plan of Care Patient             Patient will benefit from skilled therapeutic intervention in order to improve the following deficits and impairments:  Pain, Impaired sensation, Decreased coordination, Decreased mobility, Increased muscle spasms, Decreased activity tolerance, Decreased endurance, Decreased range of motion, Decreased strength, Postural dysfunction, Increased fascial restricitons  Visit Diagnosis: Chronic right shoulder pain     Problem List There are no problems to display for this patient.  DPT Hilda Lias, PT 11/29/2020, 11:02 AM  [facility] PHYSICAL AND SPORTS MEDICINE [address], [address], [place] Phone: [phone]   Fax:  [phone]  Name: Suzanne [facility] MRN: [name] Date of Birth: 1950-07-08

## 2020-12-02 ENCOUNTER — Ambulatory Visit: Payer: Medicare Other | Admitting: Physical Therapy

## 2020-12-04 ENCOUNTER — Encounter: Payer: Self-pay | Admitting: Physical Therapy

## 2020-12-04 ENCOUNTER — Ambulatory Visit: Payer: Medicare Other | Admitting: Physical Therapy

## 2020-12-04 DIAGNOSIS — M25511 Pain in right shoulder: Secondary | ICD-10-CM | POA: Diagnosis not present

## 2020-12-04 DIAGNOSIS — G8929 Other chronic pain: Secondary | ICD-10-CM

## 2020-12-04 DIAGNOSIS — M6281 Muscle weakness (generalized): Secondary | ICD-10-CM

## 2020-12-04 NOTE — Therapy (Signed)
[facility] [address], [place], [address] Phone: [phone]   Fax:  [phone]  Physical Therapy Treatment/Discharge Summary Reporting Period 11/13/20 - 12/04/20  Patient Details  Name: Suzanne Snyder MRN: [id number] Date of Birth: [birth year] No data recorded  Encounter Date: 12/04/2020   PT End of Session - 12/04/20 1324     Visit Number 14    Number of Visits 17    Date for PT Re-Evaluation 12/03/20    Authorization Type 4/10    PT Start Time 1316    PT Stop Time 1400    PT Time Calculation (min) 44 min    Activity Tolerance Patient tolerated treatment well    Behavior During Therapy [facility] for tasks assessed/performed             Past Medical History:  Diagnosis Date   Anxiety    Arthritis    Asthma    Diabetes mellitus without complication (HCC)    Insulin pump in place    Osteoporosis    RLS (restless legs syndrome)    Smokers' cough ([place])    Wears dentures    full upper and lower    Past Surgical History:  Procedure Laterality Date   CATARACT EXTRACTION W/PHACO Right 02/20/2020   Procedure: CATARACT EXTRACTION PHACO AND INTRAOCULAR LENS PLACEMENT ([place]) RIGHT 5.60 00:36.3;  Surgeon: Birder Robson, MD;  Location: [place];  Service: Ophthalmology;  Laterality: Right;  Diabetic - insulin pump Requests not first   CATARACT EXTRACTION W/PHACO Left 03/05/2020   Procedure: CATARACT EXTRACTION PHACO AND INTRAOCULAR LENS PLACEMENT ([facility]) LEFT 4.94 00:31.6;  Surgeon: Birder Robson, MD;  Location: [place];  Service: Ophthalmology;  Laterality: Left;   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   Subjective Assessment - 12/04/20 1320     Subjective Went to orthopedic doctor today that told her "there  is nothing that can be done for her shoulder". Pt reports 3/10 this am, but increased to 7/10 following shower and getting dressed. Reports 3.5/10 pain currently.    Pertinent History Suzanne Snyder is a 70 y.o. female who presents to therapy with chronic R shoulder pain. Patient reports increased pain with inability to lift her R arm above shoulder height. Patient reports increased pain with all arm movements with aggravating factors primarily with lifting and carrying. Patient demonstrates decreased ability to perform ADLs such as reaching in a high cabinet, laundry, and preparing food. Pt reports taking tylenol, gabapentin, and tramadol for pain relief. Pt is not currently working, endorses minimal hobbies and interests outside of her home. Pt denies any unexplained weight fluctuation, saddle paresthesia, loss of bowel/bladder function, or unrelenting night pain at this time. Patient has a PMH of L rTSA, chronic L shoulder pain, anxiety, DM I, COPD. Pt is lethagric throughout subjective report, with eyes closed often. Pt also reports she is emotionally distressed d/t her daughter being in a facility and having traffic court, which has caused her to lack sleep in the past few days.    Limitations Lifting;Sitting;Standing;Walking    How long can you sit comfortably? na    How long can you stand comfortably? na    How long can you walk comfortably? na  Diagnostic tests xray    Patient Stated Goals To improve shoulder function and decrease pain    Pain Onset More than a month ago             Therex  UBE L2 x 2 min FWD/BWD seat 10     PT reviewed the following HEP with patient with patient able to demonstrate a set of the following with min cuing for correction needed. PT educated patient on parameters of therex (how/when to inc/decrease intensity, frequency, rep/set range, stretch hold time, and purpose of therex) with verbalized understanding.   Access Code: [email or web address] Seated Shoulder Flexion  AAROM with Pulley Behind - 1 x daily - 7 x weekly - 12-20 reps - 2sec hold Seated Shoulder Abduction AAROM with Pulley Behind - 1 x daily - 7 x weekly - 12-20 reps - 2sec hold Standing Bilateral Low Shoulder Row with Anchored Resistance - 1 x daily - 2 x weekly - 3 sets - 6-12 reps Shoulder External Rotation with Anchored Resistance - 1 x daily - 2 x weekly - 3 sets - 6-12 reps Shoulder Internal Rotation with Resistance - 1 x daily - 2 x weekly - 3 sets - 6-12 reps Seated Single Arm Shoulder Flexion - 1 x daily - 2 x weekly - 3 sets - 6-12 reps    Education on lifestyle modifications for pain management with verbalized understanding.                          PT Education - 12/04/20 1323     Education Details therex form/technique    Person(s) Educated Patient    Methods Explanation;Demonstration;Verbal cues    Comprehension Verbalized understanding;Returned demonstration;Verbal cues required              PT Short Term Goals - 11/14/20 1315       PT SHORT TERM GOAL #1   Title Pt will demonstrate independence with HEP to improve R shoulder function for increased ability to participate with ADLs    Baseline HEP given    Time 4    Period Weeks    Status Achieved               PT Long Term Goals - 12/04/20 1325       PT LONG TERM GOAL #1   Title Patient will improve R shoulder flex/abd AROM to 160 degrees in sitting without pain greater than 5/10 on NPRS.    Baseline 10/09/20 80d AROM 11/13/20: 170 deg AROM with 5/10 pain; 12/04/20 same    Time 8    Period Weeks    Status Achieved      PT LONG TERM GOAL #2   Title Patient will improve all R shoulder strength to 4/5 for flexion, abd, and rotation in order to improve ability to perform moderate household chores.    Baseline 10/09/20 3/5 - 3+/5 within availible motion 11/13/20: 4/5 flex, 4-/5 abd, 4-/ ER, 4+/5 IIR; 12/04/20: 4/5 flex, 4/5 abd, 4/ ER, 4+/5 IR    Time 8    Period Weeks    Status  Achieved      PT LONG TERM GOAL #3   Title Patient will increase FOTO score to 58 to demonstrate predicted increase in functional mobility to complete ADLs    Baseline 10/09/20 49 11/13/20: 61    Time 8    Period Weeks    Status Achieved      PT LONG TERM  GOAL #4   Title Pt will decrease worst pain as reported on NPRS by at least 3 points in order to demonstrate clinically significant reduction in pain.    Baseline 10/08/20: 8/10 11/13/20: 7/10; 12/04/20 7/10    Time 8    Period Weeks    Status On-going                   Plan - 12/04/20 1356     Clinical Impression Statement PT reassessed goals this session where patient has met PT goals set to safely d/c to HEP for continued strengthening at home. Patient has not made progress with worst reported pain, but reports "usual" pain decrease to 3/10. Patient has met PT predicted strength goals, but should continue HEP to maintain availible strength and increase if able in order to complete heavier household tasks more independently; pt verbalizes understanding of this. Patient is able to demonstrate proper technique of HEP therex and verbalize understanding of parameters. PT continued education on lifestyle modifications for pain management as well. Pt to d/c PT.    Personal Factors and Comorbidities Age;Behavior Pattern;Past/Current Experience;Comorbidity 3+    Comorbidities DM I, anxiety, COPD, smoking    Examination-Activity Limitations Carry;Sleep;Lift;Dressing    Examination-Participation Restrictions Community Activity;Laundry;Cleaning;Driving    Stability/Clinical Decision Making Evolving/Moderate complexity    Clinical Decision Making Moderate    Rehab Potential Fair    Clinical Impairments Affecting Rehab Potential (+) highly motivated, (-) age, decreased strength    PT Frequency 2x / week    PT Duration 8 weeks    PT Treatment/Interventions Passive range of motion;Manual techniques;Electrical Stimulation;Cryotherapy;Moist  Heat;Ultrasound;Therapeutic exercise;Therapeutic activities;Neuromuscular re-education;Patient/family education;Scar mobilization;Dry needling;Iontophoresis 49m/ml Dexamethasone;Joint Manipulations;Energy conservation;ADLs/Self Care Home Management    PT Next Visit Plan AROM exercises    PT Home Exercise Plan ER/IR, shoulder ext YTB    Consulted and Agree with Plan of Care Patient             Patient will benefit from skilled therapeutic intervention in order to improve the following deficits and impairments:  Pain, Impaired sensation, Decreased coordination, Decreased mobility, Increased muscle spasms, Decreased activity tolerance, Decreased endurance, Decreased range of motion, Decreased strength, Postural dysfunction, Increased fascial restricitons  Visit Diagnosis: Chronic right shoulder pain  Muscle weakness (generalized)     Problem List There are no problems to display for this patient.  CDurwin RegesDPT CDurwin Reges PT 12/04/2020, 2:24 PM  [facility] [address] [place] [address]Phone: [phone]  Fax:  [phone] Name: Suzanne HOWELLSMRN: [id number] of Birth: 1May 23, 1952

## 2020-12-09 ENCOUNTER — Ambulatory Visit: Payer: Medicare Other | Admitting: Physical Therapy

## 2020-12-09 ENCOUNTER — Other Ambulatory Visit: Payer: Self-pay

## 2020-12-09 DIAGNOSIS — L03115 Cellulitis of right lower limb: Secondary | ICD-10-CM | POA: Diagnosis present

## 2020-12-09 DIAGNOSIS — Z79899 Other long term (current) drug therapy: Secondary | ICD-10-CM | POA: Insufficient documentation

## 2020-12-09 DIAGNOSIS — Z20822 Contact with and (suspected) exposure to covid-19: Secondary | ICD-10-CM | POA: Insufficient documentation

## 2020-12-09 DIAGNOSIS — Z96642 Presence of left artificial hip joint: Secondary | ICD-10-CM | POA: Insufficient documentation

## 2020-12-09 DIAGNOSIS — E10649 Type 1 diabetes mellitus with hypoglycemia without coma: Secondary | ICD-10-CM | POA: Diagnosis not present

## 2020-12-09 DIAGNOSIS — Z794 Long term (current) use of insulin: Secondary | ICD-10-CM | POA: Insufficient documentation

## 2020-12-09 DIAGNOSIS — J45909 Unspecified asthma, uncomplicated: Secondary | ICD-10-CM | POA: Diagnosis not present

## 2020-12-09 DIAGNOSIS — F17210 Nicotine dependence, cigarettes, uncomplicated: Secondary | ICD-10-CM | POA: Insufficient documentation

## 2020-12-09 LAB — CBC
HCT: 37.9 % (ref 36.0–46.0)
Hemoglobin: 12.3 g/dL (ref 12.0–15.0)
MCH: 31 pg (ref 26.0–34.0)
MCHC: 32.5 g/dL (ref 30.0–36.0)
MCV: 95.5 fL (ref 80.0–100.0)
Platelets: 273 10*3/uL (ref 150–400)
RBC: 3.97 MIL/uL (ref 3.87–5.11)
RDW: 14.4 % (ref 11.5–15.5)
WBC: 11.3 10*3/uL — ABNORMAL HIGH (ref 4.0–10.5)
nRBC: 0 % (ref 0.0–0.2)

## 2020-12-09 NOTE — ED Triage Notes (Signed)
Pt has insulin pump that she changes every 3 days. States she had it to her right thigh and when she replaced it 3 days ago she noticed redness and drainage to site. States area is worse at this time, insulin pump noted to left thigh.

## 2020-12-10 ENCOUNTER — Observation Stay
Admission: EM | Admit: 2020-12-10 | Discharge: 2020-12-11 | Disposition: A | Discharge status: Home / Self Care | Payer: Medicare Other | Attending: Internal Medicine | Admitting: Internal Medicine

## 2020-12-10 ENCOUNTER — Encounter: Payer: Self-pay | Admitting: Hospitalist

## 2020-12-10 DIAGNOSIS — L02415 Cutaneous abscess of right lower limb: Secondary | ICD-10-CM

## 2020-12-10 DIAGNOSIS — L03115 Cellulitis of right lower limb: Secondary | ICD-10-CM | POA: Diagnosis not present

## 2020-12-10 DIAGNOSIS — E162 Hypoglycemia, unspecified: Secondary | ICD-10-CM | POA: Diagnosis not present

## 2020-12-10 DIAGNOSIS — E10649 Type 1 diabetes mellitus with hypoglycemia without coma: Secondary | ICD-10-CM

## 2020-12-10 LAB — COMPREHENSIVE METABOLIC PANEL
ALT: 17 U/L (ref 0–44)
AST: 20 U/L (ref 15–41)
Albumin: 3.8 g/dL (ref 3.5–5.0)
Alkaline Phosphatase: 56 U/L (ref 38–126)
Anion gap: 8 (ref 5–15)
BUN: 15 mg/dL (ref 8–23)
CO2: 20 mmol/L — ABNORMAL LOW (ref 22–32)
Calcium: 8.3 mg/dL — ABNORMAL LOW (ref 8.9–10.3)
Chloride: 103 mmol/L (ref 98–111)
Creatinine, Ser: 0.74 mg/dL (ref 0.44–1.00)
GFR, Estimated: >60 mL/min (ref >60)
Glucose, Bld: 269 mg/dL — ABNORMAL HIGH (ref 70–99)
Potassium: 3.8 mmol/L (ref 3.5–5.1)
Sodium: 131 mmol/L — ABNORMAL LOW (ref 135–145)
Total Bilirubin: 0.8 mg/dL (ref 0.3–1.2)
Total Protein: 6.9 g/dL (ref 6.5–8.1)

## 2020-12-10 LAB — LACTIC ACID, PLASMA: Lactic Acid, Venous: 0.9 mmol/L (ref 0.5–1.9)

## 2020-12-10 LAB — CBC WITH DIFFERENTIAL/PLATELET
Abs Immature Granulocytes: 0.03 10*3/uL (ref 0.00–0.07)
Basophils Absolute: 0.1 10*3/uL (ref 0.0–0.1)
Basophils Relative: 1 %
Eosinophils Absolute: 0.4 10*3/uL (ref 0.0–0.5)
Eosinophils Relative: 3 %
HCT: 37.4 % (ref 36.0–46.0)
Hemoglobin: 12.4 g/dL (ref 12.0–15.0)
Immature Granulocytes: 0 %
Lymphocytes Relative: 28 %
Lymphs Abs: 3 10*3/uL (ref 0.7–4.0)
MCH: 31.9 pg (ref 26.0–34.0)
MCHC: 33.2 g/dL (ref 30.0–36.0)
MCV: 96.1 fL (ref 80.0–100.0)
Monocytes Absolute: 0.6 10*3/uL (ref 0.1–1.0)
Monocytes Relative: 6 %
Neutro Abs: 6.7 10*3/uL (ref 1.7–7.7)
Neutrophils Relative %: 62 %
Platelets: 268 10*3/uL (ref 150–400)
RBC: 3.89 MIL/uL (ref 3.87–5.11)
RDW: 14.4 % (ref 11.5–15.5)
WBC: 10.8 10*3/uL — ABNORMAL HIGH (ref 4.0–10.5)
nRBC: 0 % (ref 0.0–0.2)

## 2020-12-10 LAB — GLUCOSE, CAPILLARY
Glucose-Capillary: 231 mg/dL — ABNORMAL HIGH (ref 70–99)
Glucose-Capillary: 283 mg/dL — ABNORMAL HIGH (ref 70–99)

## 2020-12-10 LAB — RESP PANEL BY RT-PCR (FLU A&B, COVID) ARPGX2
Influenza A by PCR: NEGATIVE
Influenza B by PCR: NEGATIVE
SARS Coronavirus 2 by RT PCR: NEGATIVE

## 2020-12-10 LAB — CBG MONITORING, ED
Glucose-Capillary: 35 mg/dL — CL (ref 70–99)
Glucose-Capillary: 119 mg/dL — ABNORMAL HIGH (ref 70–99)
Glucose-Capillary: 57 mg/dL — ABNORMAL LOW (ref 70–99)
Glucose-Capillary: 109 mg/dL — ABNORMAL HIGH (ref 70–99)
Glucose-Capillary: 75 mg/dL (ref 70–99)
Glucose-Capillary: 204 mg/dL — ABNORMAL HIGH (ref 70–99)

## 2020-12-10 LAB — PROCALCITONIN: Procalcitonin: <0.1 ng/mL

## 2020-12-10 MED ORDER — INSULIN ASPART 100 UNIT/ML IJ SOLN
0.0000 [IU] | INTRAMUSCULAR | Status: DC
  Administered 2020-12-10 (×2): 2 [IU] via SUBCUTANEOUS
  Administered 2020-12-11: 3 [IU] via SUBCUTANEOUS

## 2020-12-10 MED ORDER — CYCLOBENZAPRINE HCL 10 MG PO TABS
10.0000 mg | ORAL_TABLET | Freq: Three times a day (TID) | ORAL | Status: DC | PRN
  Administered 2020-12-10: 22:00:00 10 mg via ORAL

## 2020-12-10 MED ORDER — ONDANSETRON HCL 4 MG/2ML IJ SOLN
4.0000 mg | Freq: Once | INTRAMUSCULAR | Status: AC
  Administered 2020-12-10: 4 mg via INTRAVENOUS
  Filled 2020-12-10: qty 2

## 2020-12-10 MED ORDER — GABAPENTIN 300 MG PO CAPS
300.0000 mg | ORAL_CAPSULE | Freq: Four times a day (QID) | ORAL | Status: DC
  Administered 2020-12-10 – 2020-12-11 (×4): 300 mg via ORAL
  Filled 2020-12-10: qty 1

## 2020-12-10 MED ORDER — ROPINIROLE HCL 1 MG PO TABS
1.5000 mg | ORAL_TABLET | Freq: Every day | ORAL | Status: DC
  Administered 2020-12-10: 22:00:00 1.5 mg via ORAL
  Filled 2020-12-10: qty 1.5

## 2020-12-10 MED ORDER — DEXTROSE 50 % IV SOLN
1.0000 | Freq: Once | INTRAVENOUS | Status: AC
  Administered 2020-12-10: 50 mL via INTRAVENOUS

## 2020-12-10 MED ORDER — NICOTINE 21 MG/24HR TD PT24
21.0000 mg | MEDICATED_PATCH | Freq: Every day | TRANSDERMAL | Status: DC
  Administered 2020-12-10 – 2020-12-11 (×2): 21 mg via TRANSDERMAL
  Filled 2020-12-10: qty 1

## 2020-12-10 MED ORDER — IPRATROPIUM-ALBUTEROL 0.5-2.5 (3) MG/3ML IN SOLN
3.0000 mL | Freq: Four times a day (QID) | RESPIRATORY_TRACT | Status: DC | PRN
  Administered 2020-12-10: 3 mL via RESPIRATORY_TRACT

## 2020-12-10 MED ORDER — ACETAMINOPHEN 500 MG PO TABS
1000.0000 mg | ORAL_TABLET | Freq: Three times a day (TID) | ORAL | Status: DC | PRN
  Administered 2020-12-10 – 2020-12-11 (×2): 1000 mg via ORAL

## 2020-12-10 MED ORDER — KETOROLAC TROMETHAMINE 30 MG/ML IJ SOLN
15.0000 mg | Freq: Once | INTRAMUSCULAR | Status: AC
  Administered 2020-12-10: 15 mg via INTRAVENOUS
  Filled 2020-12-10: qty 1

## 2020-12-10 MED ORDER — LIDOCAINE-EPINEPHRINE 2 %-1:100000 IJ SOLN
20.0000 mL | Freq: Once | INTRAMUSCULAR | Status: AC
  Administered 2020-12-10: 20 mL via INTRADERMAL
  Filled 2020-12-10: qty 20

## 2020-12-10 MED ORDER — DEXTROSE 5 % IV SOLN
1500.0000 mg | Freq: Once | INTRAVENOUS | Status: AC
  Administered 2020-12-10: 1500 mg via INTRAVENOUS
  Filled 2020-12-10: qty 75

## 2020-12-10 MED ORDER — SODIUM CHLORIDE 0.9 % IV SOLN
2.0000 g | Freq: Once | INTRAVENOUS | Status: AC
  Administered 2020-12-10: 2 g via INTRAVENOUS
  Filled 2020-12-10: qty 20

## 2020-12-10 MED ORDER — MONTELUKAST SODIUM 10 MG PO TABS
10.0000 mg | ORAL_TABLET | Freq: Every day | ORAL | Status: DC
  Administered 2020-12-10: 10 mg via ORAL

## 2020-12-10 MED ORDER — CLONAZEPAM 0.5 MG PO TABS
0.5000 mg | ORAL_TABLET | Freq: Two times a day (BID) | ORAL | Status: DC | PRN
  Administered 2020-12-10: 0.5 mg via ORAL

## 2020-12-10 MED ORDER — CELECOXIB 100 MG PO CAPS
100.0000 mg | ORAL_CAPSULE | Freq: Two times a day (BID) | ORAL | Status: DC
  Filled 2020-12-10: qty 1

## 2020-12-10 MED ORDER — DEXTROSE 50 % IV SOLN: 1.0000 | INTRAVENOUS | Status: DC | PRN

## 2020-12-10 MED ORDER — FLUTICASONE FUROATE-VILANTEROL 200-25 MCG/ACT IN AEPB
1.0000 | INHALATION_SPRAY | Freq: Every day | RESPIRATORY_TRACT | Status: DC
  Administered 2020-12-11: 1 via RESPIRATORY_TRACT
  Filled 2020-12-10: qty 28

## 2020-12-10 MED ORDER — ENOXAPARIN SODIUM 40 MG/0.4ML IJ SOSY
40.0000 mg | PREFILLED_SYRINGE | INTRAMUSCULAR | Status: DC
  Administered 2020-12-10: 40 mg via SUBCUTANEOUS

## 2020-12-10 MED ORDER — OXYCODONE-ACETAMINOPHEN 5-325 MG PO TABS
1.0000 | ORAL_TABLET | Freq: Once | ORAL | Status: AC
  Administered 2020-12-10: 1 via ORAL

## 2020-12-10 MED ORDER — ONDANSETRON 4 MG PO TBDP
4.0000 mg | ORAL_TABLET | Freq: Three times a day (TID) | ORAL | Status: DC | PRN
  Filled 2020-12-10: qty 1

## 2020-12-10 MED ORDER — TRAMADOL HCL 50 MG PO TABS
50.0000 mg | ORAL_TABLET | Freq: Four times a day (QID) | ORAL | Status: DC | PRN
  Administered 2020-12-10 – 2020-12-11 (×4): 50 mg via ORAL
  Filled 2020-12-10: qty 1

## 2020-12-10 NOTE — Progress Notes (Signed)
Inpatient Diabetes Program Recommendations  AACE/ADA: New Consensus Statement on Inpatient Glycemic Control   Target Ranges:  Prepandial:   less than 140 mg/dL      Peak postprandial:   less than 180 mg/dL (1-2 hours)      Critically ill patients:  140 - 180 mg/dL    Latest Reference Range & Units 12/10/20 06:50 12/10/20 07:48 12/10/20 11:22 12/10/20 11:56 12/10/20 13:19  Glucose-Capillary 70 - 99 mg/dL 35 (LL) 500 (H) 57 (L) 109 (H) 75   Review of Glycemic Control  Diabetes history: DM1 Outpatient Diabetes medications: OmniPod Insulin Pump with Novolog Current orders for Inpatient glycemic control: CBGs Q4H  Inpatient Diabetes Program Recommendations:    Insulin: With OmniPod not delivering any insulin, anticipate glucose will increase and will need to monitor glucose frequently; currently ordered CBGs Q4H. If glucose starts to increase, will need to use Novolog custom correction 0-5 units Q4H or resume basal insulin (that decision would be up to attending provider) to prevent DKA from occurring.  Novolog custom scale of 0-5 units Q4H with the following parameters: 70-120 0 units; 121-150 0 units; 151-200 1 unit; 201-250 2 units; 251-300 3 units; 301-350 4 units; 351-400 5 units; if over 400 call MD.  NOTE: Spoke with Dr. Fran Lowes and Feliz Beam, RN regarding patient and insulin pump. Patient was asked to remove OmniPod insulin pump but she did not want to remove it since she just put it on last night and the pod is good for 3 days. Spoke with patient at bedside again. Patient states she has stopped insulin delivery from the [facility] and is concerned that if she removes it now, she will not have enough pods or insulin to last until next refill.Reviewed PDM device that works with OmniPod insulin pump and verified that insulin delivery is currently stopped so insulin pump is not delivering any insulin at this time. Discussed concern about recurrent hypoglycemia as glucose has ranged from 35-119 mg/dl today  (glucose 35 mg/dl at 9:38 am which was treated; glucose back down to 57 mg/dl at 18:29 which was treated and glucose back down to 75 mg/dl at 93:71). Discussed that with OmniPod not delivering any insulin, anticipate glucose will increase and will need to monitor glucose frequently; currently ordered CBGs Q4H. Explained that if glucose starts to increase, we may need to use Novolog correction Q4H or resume basal insulin (that decision would be up to attending provider). Patient verbalized understanding and states she has no questions at this time. Discussed with Feliz Beam, RN and sent communication to Dr. Fran Lowes as well.  Thanks, Suzanne Penner, RN, MSN, CDE Diabetes Coordinator Inpatient Diabetes Program [phone] (Team Pager from 8am to 5pm)

## 2020-12-10 NOTE — ED Notes (Signed)
Pt in bed, pt has golf ball sized area of induration and baseball sized area of redness on R leg, states that she had an insulin pump there, states that she hasn't been eating much today and her sugar was low.  Husband sent to get food, pt sugar is 119 at this time. Md aware.

## 2020-12-10 NOTE — ED Notes (Signed)
Pt now awake, assisted onto stretcher; pt has insulin pump in place to left thigh; pt has a remote device that indicates glucose was 81 at 4am and no infusion being delivered (remote bases infusion on glucose values per pt)

## 2020-12-10 NOTE — ED Notes (Signed)
Iv team at bedside  

## 2020-12-10 NOTE — H&P (Signed)
History and Physical    Suzanne Snyder TZG:[id number] DOB: 14-Jun-1950 DOA: 12/10/2020  PCP: Katrinka Blazing, MD  Patient coming from: home  I have personally briefly reviewed patient's old medical records in [facility]  Chief Complaint: right thigh redness and drainage  HPI: Suzanne Snyder is a 70 y.o. female with medical history significant of DM1, asthma, current smoker, right Charcot ankle who presented with redness and drainage from site of previous insulin delivering device over her right thigh.  Pt placed a insulin delivering device (pt was adamant that it's not a pump) on her right inner thigh that she removed about 3 days ago, and the area developed redness and started to drain yellow-green purulent discharge.  Pt also reported decreased appetite since then.    Pt also complained of bilateral shoulder pain which had gone through multiple past surgeries.  Complained of foot pain.  No fever.     ED Course: initial vital: afebrile, pulse 74, BP 139/78, sating well on room air.  Labs notable for WBC 11.3, procal <0.1.  BG 269 on initial presentation, but decreased to 35 early morning for which pt received D50.  ED RN noted golf ball sized area of induration over her right thigh, and ED provider performed I/D and packed the wound.  Pt received ceftriaxone 2g x1 in the ED.  ED provider requested admission, and pt was admitted as observation.   Assessment/Plan Principal Problem:   Hypoglycemia  # Hypoglycemia  # DM1  --BG 35 this morning, received D50 but then dropped again to 57 about 4 hours later.  Presumed 2/2 to poor oral intake while receiving basal insulin through her insulin device.  Pt refused to take off her insulin device, but did turn off the basal infusion.   --A1c 6.1 on 10/07/20. --discussed with diabetic coordinator who verified pt's insulin device and dosage. Plan: --Hold long-acting insulin for now --q4h BG checks with custom scale --juice/soda for  hypoglycemia, will avoid starting D5 gtt.  # Purulent cellulitis of right thigh S/p I/D --No fever, no significant white count, neg procal, not septic appearing. --Discussed with ID, who agreed with dalbavancin given pt's obs status. Plan: --dalbavancin x1 --cont wound packing --follow up with ID on 12/17/20 at 11.45 Am   # Hx of asthma --no respiratory issues. --cont home Advair as Breo Ellipta --cont home Singulair --DuoNeb PRN   # Current smoker --nicotine patch  # Chronic shoulder pain --Per PDMP, pt currently does not have active opioids Rx. --tylenol and tramadol PRN   DVT prophylaxis: Lovenox SQ Code Status: Full code  Family Communication: husband updated at bedside  Disposition Plan: likely home tomorrow  Consults called: none Level of care: Med-Surg   Review of Systems: As per HPI otherwise complete review of systems negative.   Past Medical History:  Diagnosis Date   Anxiety    Arthritis    Asthma    Diabetes mellitus without complication (HCC)    Insulin pump in place    Osteoporosis    RLS (restless legs syndrome)    Smokers' cough (HCC)    Wears dentures    full upper and lower    Past Surgical History:  Procedure Laterality Date   CATARACT EXTRACTION W/PHACO Right 02/20/2020   Procedure: CATARACT EXTRACTION PHACO AND INTRAOCULAR LENS PLACEMENT ([facility]) RIGHT 5.60 00:36.3;  Surgeon: Galen Manila, MD;  [facility];  Service: Ophthalmology;  Laterality: Right;  Diabetic - insulin pump Requests not first  CATARACT EXTRACTION W/PHACO Left 03/05/2020   Procedure: CATARACT EXTRACTION PHACO AND INTRAOCULAR LENS PLACEMENT ([facility]) LEFT 4.94 00:31.6;  Surgeon: Galen Manila, MD;  Location: [facility];  Service: Ophthalmology;  Laterality: Left;   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   ROTATOR CUFF REPAIR Left 2014 and 2015   SPINE SURGERY N/A 1984   L4-5, not sure of procedure   THYROID SURGERY       reports that she  has been smoking cigarettes. She has a 50.00 pack-year smoking history. She has never used smokeless tobacco. She reports that she does not drink alcohol and does not use drugs.  Allergies  Allergen Reactions   Erythromycin Other (See Comments)    Severe stomach upset    Family History  Problem Relation Age of Onset   Parkinsonism Father      Prior to Admission medications   Medication Sig Start Date End Date Taking? Authorizing Provider  acetaminophen (TYLENOL) 325 MG tablet Take 650 mg by mouth every 6 (six) hours as needed.    [provider]  albuterol (VENTOLIN HFA) 108 (90 Base) MCG/ACT inhaler Inhale into the lungs every 6 (six) hours as needed for wheezing or shortness of breath.    [provider]  ammonium lactate (AMLACTIN) 12 % cream Apply topically as needed for dry skin.    [provider]  ammonium lactate (LAC-HYDRIN) 12 % cream Apply topically as needed for dry skin. Apply to affected areas of hands and feet 03/19/20   Willeen Niece, MD  celecoxib (CELEBREX) 100 MG capsule Take 100 mg by mouth 2 (two) times daily.    [provider]  cephALEXin (KEFLEX) 500 MG capsule Take 1 capsule (500 mg total) by mouth 3 (three) times daily. 05/20/20   Sharman Cheek, MD  ciclopirox (LOPROX) 0.77 % cream APPLY A SMALL AMOUNT TO AFFECTED AREA TWICE A DAY FOR FUNGAL INFECTION OF THE FEET. APPLY BETWEEN TOES AND ON TOENAILS 03/19/20   Willeen Niece, MD  clonazePAM (KLONOPIN) 0.5 MG tablet Take 0.5 mg by mouth 2 (two) times daily as needed for anxiety.    [provider]  cyclobenzaprine (FLEXERIL) 10 MG tablet Take 10 mg by mouth 3 (three) times daily as needed for muscle spasms.    [provider]  diphenhydrAMINE HCl (BENADRYL ALLERGY PO) Take 50 mg by mouth as needed.    [provider]  Fluticasone-Salmeterol (ADVAIR) 500-50 MCG/DOSE AEPB Inhale 1 puff into the lungs daily.    [provider]  gabapentin (NEURONTIN)  300 MG capsule Take 300 mg by mouth 4 (four) times daily.    [provider]  hydrOXYzine (ATARAX/VISTARIL) 10 MG tablet TAKE 1 OR 2 TABLET BY MOUTH THREE TIMES DAILY AS NEEDED FOR [facility] 03/19/20   Willeen Niece, MD  insulin aspart (NOVOLOG) 100 UNIT/ML injection Inject into the skin 4 (four) times daily as needed for high blood sugar (with meals and at bedtime).    [provider]  ipratropium (ATROVENT) 0.02 % nebulizer solution Take 0.5 mg by nebulization every 6 (six) hours as needed for wheezing or shortness of breath.    [provider]  lidocaine (LIDODERM) 5 % Place 1 patch onto the skin daily as needed. Remove & Discard patch within 12 hours or as directed by MD    [provider]  montelukast (SINGULAIR) 10 MG tablet Take 10 mg by mouth at bedtime.    [provider]  Multiple Vitamin (MULTIVITAMIN ADULT PO)  Take by mouth 2 (two) times daily.    [provider]  mupirocin ointment (BACTROBAN) 2 % Apply 1 application topically 2 (two) times daily. Apply to affected area of forehead 05/21/20   Willeen Niece, MD  rOPINIRole (REQUIP) 1 MG tablet Take 1 mg by mouth daily.    [provider]  sulfamethoxazole-trimethoprim (BACTRIM DS) 800-160 MG tablet Take 1 tablet by mouth 2 (two) times daily. 05/20/20   Sharman Cheek, MD  tapentadol (NUCYNTA) 50 MG TABS tablet Take 50 mg by mouth.    [provider]  triamcinolone (KENALOG) 0.1 % Apply 1 application topically 2 (two) times daily as needed (Rash). To affected areas of hands and back until rash cleared 03/19/20   Willeen Niece, MD  triamcinolone cream (KENALOG) 0.1 % Apply a small amount to affected area 1 to 2 times daily as needed for itchy areas on back, Avoid face, groin, and underarms. NEEDS APPT FOR FURTHER REFILLS 08/30/19   Willeen Niece, MD  Vitamin D, Ergocalciferol, (DRISDOL) 50000 UNITS CAPS capsule Take 50,000 Units by mouth 2 (two) times a week.    [provider]    Physical Exam: Vitals:   12/10/20 1430 12/10/20 1500 12/10/20 1600 12/10/20 1609  BP: (!) 143/49 (!) 131/53 (!) 129/49 (!) 131/47  Pulse: 62 (!) 54 (!) 57 (!) 57  Resp: (!) 25 11 11 14   Temp:    98.2 F (36.8 C)  TempSrc:    Oral  SpO2: 97% 97% 93% 94%  Weight:      Height:        Constitutional: NAD, AAOx3 HEENT: conjunctivae and lids normal, EOMI CV: No cyanosis.   RESP: normal respiratory effort, on RA Extremities: No effusions, edema in BLE SKIN: warm, dry, erythema and wound with packing as in pic Neuro: II - XII grossly intact.        Labs on Admission: I have personally reviewed following labs and imaging studies  CBC: Recent Labs  Lab 12/09/20 2334  WBC 10.8*  11.3*  NEUTROABS 6.7  HGB 12.4  12.3  HCT 37.4  37.9  MCV 96.1  95.5  PLT 268  273   Basic Metabolic Panel: Recent Labs  Lab 12/09/20 2334  NA 131*  K 3.8  CL 103  CO2 20*  GLUCOSE 269*  BUN 15  CREATININE 0.74  CALCIUM 8.3*   GFR: Estimated Creatinine Clearance: 56.6 mL/min (by C-G formula based on SCr of 0.74 mg/dL). Liver Function Tests: Recent Labs  Lab 12/09/20 2334  AST 20  ALT 17  ALKPHOS 56  BILITOT 0.8  PROT 6.9  ALBUMIN 3.8   No results for input(s): LIPASE, AMYLASE in the last 168 hours. No results for input(s): AMMONIA in the last 168 hours. Coagulation Profile: No results for input(s): INR, PROTIME in the last 168 hours. Cardiac Enzymes: No results for input(s): CKTOTAL, CKMB, CKMBINDEX, TROPONINI in the last 168 hours. BNP (last 3 results) No results for input(s): PROBNP in the last 8760 hours. HbA1C: No results for input(s): HGBA1C in the last 72 hours. CBG: Recent Labs  Lab 12/10/20 0748 12/10/20 1122 12/10/20 1156 12/10/20 1319 12/10/20 1607  GLUCAP 119* 57* 109* 75 204*   Lipid Profile: No results for input(s): CHOL, HDL, LDLCALC, TRIG, CHOLHDL, LDLDIRECT in the last 72 hours. Thyroid Function Tests: No results for  input(s): TSH, T4TOTAL, FREET4, T3FREE, THYROIDAB in the last 72 hours. Anemia Panel: No results for input(s): VITAMINB12, FOLATE, FERRITIN, TIBC, IRON, RETICCTPCT in the  last 72 hours. Urine analysis: No results found for: COLORURINE, APPEARANCEUR, LABSPEC, PHURINE, GLUCOSEU, HGBUR, BILIRUBINUR, KETONESUR, PROTEINUR, UROBILINOGEN, NITRITE, LEUKOCYTESUR  Radiological Exams on Admission: No results found.    Darlin Priestly MD [facility]  If 7PM-7AM, please contact night-coverage 12/10/2020, 7:37 PM

## 2020-12-10 NOTE — ED Provider Notes (Signed)
[facility] Emergency Department Provider Note  ____________________________________________   Event Date/Time   First MD Initiated Contact with Patient 12/10/20 [phone]     (approximate)  I have reviewed the triage vital signs and the nursing notes.   HISTORY  Chief Complaint Wound Infection    HPI Suzanne Snyder is a 70 y.o. female  here with thigh pain and redness. Pt states that about 3 days ago, she removed an insulin pump/monitor apparatus from her R thigh. Since then, she's had redness, drainage, and yellow-green purulent discharge from the area, with increasing pain. Pain is aching, throbbing. The redness has spread around the area. She has had associated high and low sugars at home, and general weakness. No known fevers but has had chills. Reports general fatigue with this. No n/v/d. No other complaints. Of note, BG 30s in WR - was given D50 and improved. She had not eaten all night. Reports she has not been eating well. Pain worse w/ palpation and movement. No alleviating factors.       Past Medical History:  Diagnosis Date   Anxiety    Arthritis    Asthma    Diabetes mellitus without complication (HCC)    Insulin pump in place    Osteoporosis    RLS (restless legs syndrome)    Smokers' cough ([place])    Wears dentures    full upper and lower    Patient Active Problem List   Diagnosis Date Noted   Hypoglycemia 12/10/2020    Past Surgical History:  Procedure Laterality Date   CATARACT EXTRACTION W/PHACO Right 02/20/2020   Procedure: CATARACT EXTRACTION PHACO AND INTRAOCULAR LENS PLACEMENT ([place]) RIGHT 5.60 00:36.3;  Surgeon: Birder Robson, MD;  Location: [place];  Service: Ophthalmology;  Laterality: Right;  Diabetic - insulin pump Requests not first   CATARACT EXTRACTION W/PHACO Left 03/05/2020   Procedure: CATARACT EXTRACTION PHACO AND INTRAOCULAR LENS PLACEMENT ([facility]) LEFT 4.94 00:31.6;  Surgeon: Birder Robson, MD;   Location: [place];  Service: Ophthalmology;  Laterality: Left;   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 Authorizing Provider  acetaminophen (TYLENOL) 325 MG tablet Take 650 mg by mouth every 6 (six) hours as needed.   Yes [provider]  albuterol (VENTOLIN HFA) 108 (90 Base) MCG/ACT inhaler Inhale into the lungs every 6 (six) hours as needed for wheezing or shortness of breath.   Yes [provider]  ammonium lactate (LAC-HYDRIN) 12 % cream Apply topically as needed for dry skin. Apply to affected areas of hands and feet 03/19/20  Yes Brendolyn Patty, MD  celecoxib (CELEBREX) 100 MG capsule Take 100 mg by mouth 2 (two) times daily.   Yes [provider]  ciclopirox (LOPROX) 0.77 % cream APPLY A SMALL AMOUNT TO AFFECTED AREA TWICE A DAY FOR FUNGAL INFECTION OF THE FEET. APPLY BETWEEN TOES AND ON TOENAILS 03/19/20  Yes Brendolyn Patty, MD  clonazePAM (KLONOPIN) 0.5 MG tablet Take 0.5 mg by mouth 2 (two) times daily as needed for anxiety.   Yes [provider]  cyclobenzaprine (FLEXERIL) 10 MG tablet Take 10 mg by mouth 3 (three) times daily as needed for muscle spasms.   Yes [provider]  diphenhydrAMINE HCl (  BENADRYL ALLERGY PO) Take 50 mg by mouth as needed.   Yes [provider]  Fluticasone-Salmeterol (ADVAIR) 500-50 MCG/DOSE AEPB Inhale 1 puff into the lungs daily.   Yes [provider]  gabapentin (NEURONTIN) 300 MG capsule Take 300 mg by mouth 4 (four) times daily.   Yes [provider]  hydrOXYzine (ATARAX/VISTARIL) 10 MG tablet TAKE 1 OR 2 TABLET BY MOUTH THREE TIMES DAILY AS NEEDED FOR [facility] 03/19/20  Yes Willeen Niece, MD  insulin aspart (NOVOLOG) 100 UNIT/ML injection Inject into the skin 4 (four) times daily as  needed for high blood sugar (with meals and at bedtime).   Yes [provider]  ipratropium (ATROVENT) 0.02 % nebulizer solution Take 0.5 mg by nebulization every 6 (six) hours as needed for wheezing or shortness of breath.   Yes [provider]  lidocaine (LIDODERM) 5 % Place 1 patch onto the skin daily as needed. Remove & Discard patch within 12 hours or as directed by MD   Yes [provider]  montelukast (SINGULAIR) 10 MG tablet Take 10 mg by mouth at bedtime.   Yes [provider]  Multiple Vitamin (MULTIVITAMIN ADULT PO) Take by mouth 2 (two) times daily.   Yes [provider]  mupirocin ointment (BACTROBAN) 2 % Apply 1 application topically 2 (two) times daily. Apply to affected area of forehead 05/21/20  Yes Willeen Niece, MD  Omega-3 Fatty Acids (FISH OIL) 1000 MG CAPS Take 2 capsules by mouth 2 (two) times daily. 08/04/19  Yes [provider]  rOPINIRole (REQUIP) 1 MG tablet Take 1.5 mg by mouth at bedtime.   Yes [provider]  tapentadol (NUCYNTA) 50 MG TABS tablet Take 50 mg by mouth.   Yes [provider]  traMADol (ULTRAM) 50 MG tablet Take 1 tablet by mouth every 8 (eight) hours as needed. 10/15/20  Yes [provider]  triamcinolone (KENALOG) 0.1 % Apply 1 application topically 2 (two) times daily as needed (Rash). To affected areas of hands and back until rash cleared 03/19/20  Yes Willeen Niece, MD  triamcinolone cream (KENALOG) 0.1 % Apply a small amount to affected area 1 to 2 times daily as needed for itchy areas on back, Avoid face, groin, and underarms. NEEDS APPT FOR FURTHER REFILLS 08/30/19  Yes Willeen Niece, MD  Vitamin D, Ergocalciferol, (DRISDOL) 50000 UNITS CAPS capsule Take 50,000 Units by mouth 2 (two) times a week.   Yes [provider]  cephALEXin (KEFLEX) 500 MG capsule Take 1 capsule (500 mg total) by mouth 3 (three) times daily. Patient not taking: Reported on 12/10/2020 05/20/20    Sharman Cheek, MD  sulfamethoxazole-trimethoprim (BACTRIM DS) 800-160 MG tablet Take 1 tablet by mouth 2 (two) times daily. Patient not taking: Reported on 12/10/2020 05/20/20   Sharman Cheek, MD    Allergies Erythromycin  No family history on file.  Social History Social History   Tobacco Use   Smoking status: Every Day    Packs/day: 1.00    Years: 50.00    Pack years: 50.00    Types: Cigarettes   Smokeless tobacco: Never   Tobacco comments:    started smoking age 59  Vaping Use   Vaping Use: Former   Devices: "tried it"  Substance Use Topics   Alcohol use: No   Drug use: No    Review of Systems  Review of Systems  Constitutional:  Positive for chills and fatigue. Negative for fever.  HENT:  Negative for congestion and  sore throat.   Eyes:  Negative for visual disturbance.  Respiratory:  Negative for cough and shortness of breath.   Cardiovascular:  Negative for chest pain.  Gastrointestinal:  Negative for abdominal pain, diarrhea, nausea and vomiting.  Genitourinary:  Negative for flank pain.  Musculoskeletal:  Negative for back pain and neck pain.  Skin:  Positive for rash and wound.  Neurological:  Positive for weakness.  All other systems reviewed and are negative.   ____________________________________________  PHYSICAL EXAM:      VITAL SIGNS: ED Triage Vitals  Enc Vitals Group     BP 12/09/20 2328 139/78     Pulse Rate 12/09/20 2328 74     Resp 12/09/20 2328 20     Temp 12/09/20 2328 98.1 F (36.7 C)     Temp Source 12/09/20 2328 Oral     SpO2 12/09/20 2328 93 %     Weight 12/09/20 2329 119 lb (54 kg)     Height 12/09/20 2329 5\' 8"  (1.727 m)     Head Circumference --      Peak Flow --      Pain Score 12/09/20 2329 4     Pain Loc --      Pain Edu? --      Excl. in [place]? --      Physical Exam Vitals and nursing note reviewed.  Constitutional:      General: She is not in acute distress.    Appearance: She is well-developed.  HENT:      Head: Normocephalic and atraumatic.  Eyes:     Conjunctiva/sclera: Conjunctivae normal.  Cardiovascular:     Rate and Rhythm: Normal rate and regular rhythm.     Heart sounds: Normal heart sounds.  Pulmonary:     Effort: Pulmonary effort is normal. No respiratory distress.     Breath sounds: No wheezing.  Abdominal:     General: There is no distension.  Musculoskeletal:     Cervical back: Neck supple.     Left lower leg: Left lower leg edema: None.  Skin:    General: Skin is warm.     Capillary Refill: Capillary refill takes less than 2 seconds.     Findings: No rash.     Comments: Firm, indurated, fluctuant wound to right medial thigh with approx 3 cm circumferential redness/warmth. No crepitance. No drainage at this time.  Neurological:     Mental Status: She is alert and oriented to person, place, and time.     Motor: No abnormal muscle tone.      ____________________________________________   LABS (all labs ordered are listed, but only abnormal results are displayed)  Labs Reviewed  CBC - Abnormal; Notable for the following components:      Result Value   WBC 11.3 (*)    All other components within normal limits  COMPREHENSIVE METABOLIC PANEL - Abnormal; Notable for the following components:   Sodium 131 (*)    CO2 20 (*)    Glucose, Bld 269 (*)    Calcium 8.3 (*)    All other components within normal limits  CBC WITH DIFFERENTIAL/PLATELET - Abnormal; Notable for the following components:   WBC 10.8 (*)    All other components within normal limits  CBG MONITORING, ED - Abnormal; Notable for the following components:   Glucose-Capillary 35 (*)    All other components within normal limits  CBG MONITORING, ED - Abnormal; Notable for the following components:   Glucose-Capillary 119 (*)  All other components within normal limits  CBG MONITORING, ED - Abnormal; Notable for the following components:   Glucose-Capillary 57 (*)    All other components within normal  limits  CBG MONITORING, ED - Abnormal; Notable for the following components:   Glucose-Capillary 109 (*)    All other components within normal limits  RESP PANEL BY RT-PCR (FLU A&B, COVID) ARPGX2  CULTURE, BLOOD (ROUTINE X 2)  CULTURE, BLOOD (ROUTINE X 2)  AEROBIC CULTURE W GRAM STAIN (SUPERFICIAL SPECIMEN)  LACTIC ACID, PLASMA  PROCALCITONIN  HIV ANTIBODY (ROUTINE TESTING W REFLEX)  CBG MONITORING, ED    ____________________________________________  EKG: None ________________________________________  RADIOLOGY All imaging, including plain films, CT scans, and ultrasounds, independently reviewed by me, and interpretations confirmed via formal radiology reads.  ED MD interpretation:   None  Official radiology report(s): No results found.  ____________________________________________  PROCEDURES   Procedure(s) performed (including Critical Care):  Marland KitchenMarland KitchenIncision and Drainage  Date/Time: 12/10/2020 3:57 PM Performed by: Duffy Bruce, MD Authorized by: Duffy Bruce, MD   Consent:    Consent obtained:  Verbal   Consent given by:  Patient   Risks, benefits, and alternatives were discussed: yes     Risks discussed:  Incomplete drainage, pain, infection, damage to other organs and bleeding   Alternatives discussed:  Alternative treatment Universal protocol:    Patient identity confirmed:  Verbally with patient Location:    Type:  Abscess   Size:  3x3   Location:  Lower extremity   Lower extremity location:  Leg   Leg location:  R upper leg Pre-procedure details:    Skin preparation:  Povidone-iodine Sedation:    Sedation type:  None Anesthesia:    Anesthesia method:  Local infiltration   Local anesthetic:  Lidocaine 2% WITH epi Procedure type:    Complexity:  Simple Procedure details:    Incision types:  Single straight   Incision depth:  Dermal   Wound management:  Probed and deloculated   Drainage:  Purulent   Drainage amount:  Moderate   Wound  treatment:  Drain placed   Packing materials:  1/4 in iodoform gauze Post-procedure details:    Procedure completion:  Tolerated well, no immediate complications  ____________________________________________  INITIAL IMPRESSION / MDM / ASSESSMENT AND PLAN / ED COURSE  As part of my medical decision making, I reviewed the following data within the [facility] notes reviewed and incorporated, Old chart reviewed, Notes from prior ED visits, and [place] Controlled Substance Database       *Suzanne Snyder was evaluated in Emergency Department on 12/10/2020 for the symptoms described in the history of present illness. She was evaluated in the context of the global COVID-19 pandemic, which necessitated consideration that the patient might be at risk for infection with the SARS-CoV-2 virus that causes COVID-19. Institutional protocols and algorithms that pertain to the evaluation of patients at risk for COVID-19 are in a state of rapid change based on information released by regulatory bodies including the CDC and federal and state organizations. These policies and algorithms were followed during the patient's care in the ED.  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  Regarding her right thigh pain, patient has approximately 3 x 3 cm fluctuant abscess with streaking erythema extending multiple centimeters and  up the thigh.  She has a history of recurrent cellulitis.  Lab work shows moderate leukocytosis and she is mildly tachycardic.  Concern for possible sepsis due to cellulitis with history of the same.  Given her diabetes and history of requiring admission in the past, will start empiric IV antibiotics.  Incision and drainage performed.  Patient is nontoxic, no lactic acidosis or  evidence to suggest sepsis.  Clinically, do not suspect necrotizing fasciitis.  Wound cultures been sent.  Admit to medicine.  ____________________________________________  FINAL CLINICAL IMPRESSION(S) / ED DIAGNOSES  Final diagnoses:  Cellulitis and abscess of right leg     MEDICATIONS GIVEN DURING THIS VISIT:  Medications  dextrose 50 % solution 50 mL (has no administration in time range)  celecoxib (CELEBREX) capsule 100 mg (has no administration in time range)  clonazePAM (KLONOPIN) tablet 0.5 mg (has no administration in time range)  cyclobenzaprine (FLEXERIL) tablet 10 mg (has no administration in time range)  gabapentin (NEURONTIN) capsule 300 mg (has no administration in time range)  rOPINIRole (REQUIP) tablet 1.5 mg (has no administration in time range)  fluticasone furoate-vilanterol (BREO ELLIPTA) 200-25 MCG/ACT 1 puff (has no administration in time range)  montelukast (SINGULAIR) tablet 10 mg (has no administration in time range)  enoxaparin (LOVENOX) injection 40 mg (has no administration in time range)  dalbavancin (DALVANCE) 1,500 mg in dextrose 5 % 500 mL IVPB (has no administration in time range)  insulin aspart (novoLOG) injection 0-5 Units (has no administration in time range)  dextrose 50 % solution 50 mL (50 mLs Intravenous Given 12/10/20 0700)  lidocaine-EPINEPHrine (XYLOCAINE W/EPI) 2 %-1:100000 (with pres) injection 20 mL (20 mLs Intradermal Given by Other 12/10/20 1021)  cefTRIAXone (ROCEPHIN) 2 g in sodium chloride 0.9 % 100 mL IVPB (0 g Intravenous Stopped 12/10/20 1133)  ondansetron (ZOFRAN) injection 4 mg (4 mg Intravenous Given 12/10/20 1023)  ketorolac (TORADOL) 30 MG/ML injection 15 mg (15 mg Intravenous Given 12/10/20 1026)  oxyCODONE-acetaminophen (PERCOCET/ROXICET) 5-325 MG per tablet 1 tablet (1 tablet Oral Given 12/10/20 1022)     ED Discharge Orders     None        Note:  This document was prepared using Dragon voice recognition software  and may include unintentional dictation errors.   Duffy Bruce, MD 12/10/20 [phone]

## 2020-12-10 NOTE — ED Notes (Addendum)
Pt found smoking in lobby BR, unsteady gait noted; assisted into w/c and informed absolutely no smoking allowed; husband with pt at this time sitting in lobby; as of note, pt has been noted frequently exiting and re-entering lobby smelling heavily of cigarette smoke; FSBS checked, value 35; taken immed to 12H for IV and dextrose; Dr Dolores Frame notified

## 2020-12-10 NOTE — ED Notes (Signed)
Iv attempt unsuccessful, md notified, pt awaits iv team.

## 2020-12-10 NOTE — ED Notes (Signed)
Pt in bed eating meal tray, md explained that pt's insulin device is still delivering a basal rate, talked with pt and asked her to remove her device, pt refused stating that she only gets so many via insurance and this one was just placed last night.

## 2020-12-10 NOTE — ED Notes (Signed)
Pt in bed, md at bedside, sugar 57, md aware, two juices given per md instructions.

## 2020-12-10 NOTE — ED Notes (Signed)
Notified md of blood sugar, pt has meal tray at bedside but hasn't eaten anything.  Encouraged pt to eat.

## 2020-12-10 NOTE — ED Notes (Signed)
Meal tray given 

## 2020-12-10 NOTE — Progress Notes (Signed)
Patient received to Room 31. Patient restless but cooperative. Alert and oriented, resting on stretcher, NAD. Husband at bedside.

## 2020-12-10 NOTE — ED Notes (Signed)
Pt in hallway, number 20 gauge iv placed L upper arm via ultra sound after one unsuccessful attempt, blood culture number one drawn during iv start, blood culture number two drawn via 23 gauge butterfly from L hand.

## 2020-12-10 NOTE — Progress Notes (Signed)
Inpatient Diabetes Program Recommendations  AACE/ADA: New Consensus Statement on Inpatient Glycemic Control   Target Ranges:  Prepandial:   less than 140 mg/dL      Peak postprandial:   less than 180 mg/dL (1-2 hours)      Critically ill patients:  140 - 180 mg/dL    Latest Reference Range & Units 12/10/20 06:50 12/10/20 07:48  Glucose-Capillary 70 - 99 mg/dL 35 (LL) 573 (H)    Latest Reference Range & Units 12/09/20 23:34  Glucose 70 - 99 mg/dL 220 (H)   Review of Glycemic Control  Diabetes history: DM1 Outpatient Diabetes medications: OmniPod Insulin Pump with Novolog Current orders for Inpatient glycemic control: None; in ED  Inpatient Diabetes Program Recommendations:    Insulin: If patient will be admitted and allowed to use insulin pump, please use Insulin Pump order set to order CBGs ACHS&2am and insulin pump ACHS&2am. If provider prefers to use SQ insulin, would need to order basal, meal coverage, and correction insulin and have patient remove insulin pump. Please keep in mind that patient is very sensitive to insulin if SQ insulin is used.  NOTE: Patient presented to Emergency Department on 12/09/20 due to thigh pain and redness. Per note on 12/10/20 by Dr. Penne Lash, "Pt states that about 3 days ago, she removed an insulin pump/monitor apparatus from her R thigh. Since then, she's had redness, drainage, and yellow-green purulent discharge from the area, with increasing pain. Pain is aching, throbbing. The redness has spread around the area. She has had associated high and low sugars at home, and general weakness. No known fevers but has had chills. Reports general fatigue with this. No n/v/d. No other complaints. Of note, BG 30s in WR - was given D50 and improved. She had not eaten all night." In reviewing chart, noted patient sees Dr. Monia Sabal at [facility] for DM management and patient was last seen 10/07/20 and A1C was 6.1% at that time; also noted patient having frequent lows.  Patient's CBG noted to be 35 mg/dl at 2:54 am today which was treated. Per nursing notes in chart today, patient has insulin pump on left thigh.   Spoke with patient in ED at 11:00 am today. Patient states that she has on OmniPod insulin pump on left thigh that she put on last night prior to coming to the hospital. Patient states that she does NOT have Dexcom CGM and reports that she has not received the Dexcom yet and she is unsure if it will be covered by her insurance or not. Patient reports that she removed OmniPod from right thigh 3 days ago and it has progressively gotten worse because it is infected. Patient states that she only uses her thighs to apply insulin pump OmniPod due to limited shoulder mobility from prior surgeries so it is difficult to use her arms. She states she rotates her insulin pump from right to left thigh and she tries to avoid using the same spot each time. Patient reports that she does finger sticks for glucose monitoring and that her glucose has been "all over the place since getting the infection."  Patient reports that she has several stressful factors that influence her glucose (taking care of her DM, taking care of her husband who has dementia (she states he is currently somewhere in the hospital and she hopes he can find his way back to her room), this infection, has a daughter who is a felon, and no one to help her with her husband).  Patient has PDM device in her purse on her bed. Reviewed PDM and the following are current insulin pump settings:  Basal insulin  12A 0.65 units/hour 3A 0.45 units/hour 6A 0.85 units/hour 8A 0.50 units/hour 12P 0.35 units/hour 6P 0.15 units/hour Total daily basal insulin: 10 units/24 hours  Carb Coverage 12A 1:21      1 unit for every 21 grams of carbohydrates 5A   1:18.5   1 unit for every 18.5 grams of carbohydrates 10A  1:19.5  1 unit for every 19.5 grams of carbohydrates 1P    1:21     1 unit for every 21 grams of  carbohydrates  Insulin Sensitivity 12A   1:90 1 unit drops blood glucose 90 mg/dl 52Z   7:47 1 unit drops blood glucose 70 mg/dl 6P     1:59 1 unit drops blood glucose 80 mg/dl  Target Glucose Goals 53X 120-130 mg/dl  Patient reports that she has not eaten since 12/08/20 which is why her sugar has been running low. Patient reports that she does not have any extra pump supplies with her here at the hospital. She states she could get her husband to go home and get extra OmniPod pods if she tells him exactly what to do and where they are. Explained that if she is admitted and not able to get more pump supplies, we would have to transition to SQ insulin injections once her current OmniPod runs out (only works for 3 days). Patient verbalized understanding of information discussed and states that she does not have any further questions related to diabetes at this time.  NURSING: If insulin pump order set is ordered please print off the Patient insulin pump contract and flow sheet. The insulin pump contract should be signed by the patient and then placed in the chart. The patient insulin pump flow sheet will be completed by the patient at the bedside and the RN caring for the patient will use the patient's flow sheet to document in the [facility]. RN will need to complete the Nursing Insulin Pump Flowsheet at least once a shift. Patient will need to keep extra insulin pump supplies at the bedside at all times.   Thanks, Orlando Penner, RN, MSN, CDE Diabetes Coordinator Inpatient Diabetes Program [phone] (Team Pager from 8am to 5pm)

## 2020-12-10 NOTE — ED Notes (Addendum)
Iv appears to have infiltrated, stopped infusion, aprox 100 ml infused, called pharmacy, per pharmacy no actions are needed, either ice or heat for pt comfort only.  IV team paged.

## 2020-12-11 ENCOUNTER — Encounter: Payer: Medicare Other | Admitting: Physical Therapy

## 2020-12-11 DIAGNOSIS — E10649 Type 1 diabetes mellitus with hypoglycemia without coma: Secondary | ICD-10-CM

## 2020-12-11 DIAGNOSIS — L03115 Cellulitis of right lower limb: Secondary | ICD-10-CM | POA: Diagnosis not present

## 2020-12-11 LAB — CBC
HCT: 38.8 % (ref 36.0–46.0)
Hemoglobin: 12.6 g/dL (ref 12.0–15.0)
MCH: 30.9 pg (ref 26.0–34.0)
MCHC: 32.5 g/dL (ref 30.0–36.0)
MCV: 95.1 fL (ref 80.0–100.0)
Platelets: 249 10*3/uL (ref 150–400)
RBC: 4.08 MIL/uL (ref 3.87–5.11)
RDW: 14.6 % (ref 11.5–15.5)
WBC: 6.9 10*3/uL (ref 4.0–10.5)
nRBC: 0 % (ref 0.0–0.2)

## 2020-12-11 LAB — GLUCOSE, CAPILLARY
Glucose-Capillary: 34 mg/dL — CL (ref 70–99)
Glucose-Capillary: 55 mg/dL — ABNORMAL LOW (ref 70–99)
Glucose-Capillary: 126 mg/dL — ABNORMAL HIGH (ref 70–99)
Glucose-Capillary: 176 mg/dL — ABNORMAL HIGH (ref 70–99)
Glucose-Capillary: 167 mg/dL — ABNORMAL HIGH (ref 70–99)

## 2020-12-11 LAB — BASIC METABOLIC PANEL
Anion gap: 6 (ref 5–15)
BUN: 15 mg/dL (ref 8–23)
CO2: 22 mmol/L (ref 22–32)
Calcium: 8.1 mg/dL — ABNORMAL LOW (ref 8.9–10.3)
Chloride: 104 mmol/L (ref 98–111)
Creatinine, Ser: 0.74 mg/dL (ref 0.44–1.00)
GFR, Estimated: >60 mL/min (ref >60)
Glucose, Bld: 81 mg/dL (ref 70–99)
Potassium: 3.6 mmol/L (ref 3.5–5.1)
Sodium: 132 mmol/L — ABNORMAL LOW (ref 135–145)

## 2020-12-11 LAB — MAGNESIUM: Magnesium: 1.9 mg/dL (ref 1.7–2.4)

## 2020-12-11 LAB — HIV ANTIBODY (ROUTINE TESTING W REFLEX): HIV Screen 4th Generation wRfx: NONREACTIVE

## 2020-12-11 MED ORDER — SENNOSIDES-DOCUSATE SODIUM 8.6-50 MG PO TABS
2.0000 | ORAL_TABLET | Freq: Two times a day (BID) | ORAL | Status: DC
  Administered 2020-12-11: 2 via ORAL
  Filled 2020-12-11: qty 2

## 2020-12-11 NOTE — Discharge Summary (Signed)
Physician Discharge Summary  Patient ID: Suzanne Snyder MRN: [id number] DOB/AGE: October 13, 1950 70 y.o.  Admit date: 12/10/2020 Discharge date: 12/11/2020  Admission Diagnoses:  Discharge Diagnoses:  Principal Problem:   Hypoglycemia   Discharged Condition: fair  Hospital Course:  Suzanne Snyder is a 70 y.o. female with medical history significant of DM1, asthma, current smoker, right Charcot ankle who presented with redness and drainage from site of previous insulin delivering device over her right thigh.  Type 1 diabetes with hypoglycemia. Patient had significant hypoglycemia in the hospital.  Discussed with the patient, patient states that she normally does not have significant hypoglycemia at home.  She was not able to eat dinner while in the hospital because of transfer to different unit. She put back on her basal insulin last night, initially was told to not restarted by diabetes coordinator. Discussed with diabetes coordinator, confirmed that patient does not have secondary hypoglycemia at home.  At this point, she will be discharged.  She will follow-up with her endocrinologist to adjust her insulin pump setting.  # Purulent cellulitis of right thigh S/p I/D Patient received 1 dose of daptomycin.  Wound is packed.  I will set up home care nurse for wound care.  She will follow-up with Dr. Joylene Draft as scheduled on 12/6 at 11:45 AM.  Asthma. Continue home medicines.  Chronic shoulder pain. Follow-up with PCP as outpatient.  Consults: None  Significant Diagnostic Studies:   Treatments: Dalbavancin.  D50.  Discharge Exam: Blood pressure (!) 116/50, pulse 60, temperature 97.8 F (36.6 C), temperature source Oral, resp. rate 16, height 5\' 8"  (1.727 m), weight 54 kg, SpO2 95 %. General appearance: alert and cooperative Resp: clear to auscultation bilaterally Cardio: regular rate and rhythm, S1, S2 normal, no murmur, click, rub or gallop GI: soft, non-tender; bowel  sounds normal; no masses,  no organomegaly Extremities: Right thigh still red, with small amount draining.  Nontender.  Swelling has come down.  Disposition: Discharge disposition: 01-Home or Self Care       Discharge Instructions     Diet - low sodium heart healthy   Complete by: As directed    Discharge wound care:   Complete by: As directed    Follow with visitng RN   Increase activity slowly   Complete by: As directed       Allergies as of 12/11/2020       Reactions   Erythromycin Other (See Comments)   Severe stomach upset        Medication List     STOP taking these medications    cephALEXin 500 MG capsule Commonly known as: KEFLEX   sulfamethoxazole-trimethoprim 800-160 MG tablet Commonly known as: Bactrim DS       TAKE these medications    acetaminophen 325 MG tablet Commonly known as: TYLENOL Take 650 mg by mouth every 6 (six) hours as needed.   albuterol 108 (90 Base) MCG/ACT inhaler Commonly known as: VENTOLIN HFA Inhale into the lungs every 6 (six) hours as needed for wheezing or shortness of breath.   ammonium lactate 12 % cream Commonly known as: Lac-Hydrin Apply topically as needed for dry skin. Apply to affected areas of hands and feet   BENADRYL ALLERGY PO Take 50 mg by mouth as needed.   celecoxib 100 MG capsule Commonly known as: CELEBREX Take 100 mg by mouth 2 (two) times daily.   ciclopirox 0.77 % cream Commonly known as: LOPROX APPLY A SMALL AMOUNT TO AFFECTED AREA TWICE A  DAY FOR FUNGAL INFECTION OF THE FEET. APPLY BETWEEN TOES AND ON TOENAILS   clonazePAM 0.5 MG tablet Commonly known as: KLONOPIN Take 0.5 mg by mouth 2 (two) times daily as needed for anxiety.   cyclobenzaprine 10 MG tablet Commonly known as: FLEXERIL Take 10 mg by mouth 3 (three) times daily as needed for muscle spasms.   Fish Oil 1000 MG Caps Take 2 capsules by mouth 2 (two) times daily.   Fluticasone-Salmeterol 500-50 MCG/DOSE Aepb Commonly  known as: ADVAIR Inhale 1 puff into the lungs daily.   gabapentin 300 MG capsule Commonly known as: NEURONTIN Take 300 mg by mouth 4 (four) times daily.   hydrOXYzine 10 MG tablet Commonly known as: ATARAX TAKE 1 OR 2 TABLET BY MOUTH THREE TIMES DAILY AS NEEDED FOR ITCH   insulin aspart 100 UNIT/ML injection Commonly known as: novoLOG Inject into the skin 4 (four) times daily as needed for high blood sugar (with meals and at bedtime).   ipratropium 0.02 % nebulizer solution Commonly known as: ATROVENT Take 0.5 mg by nebulization every 6 (six) hours as needed for wheezing or shortness of breath.   lidocaine 5 % Commonly known as: LIDODERM Place 1 patch onto the skin daily as needed. Remove & Discard patch within 12 hours or as directed by MD   montelukast 10 MG tablet Commonly known as: SINGULAIR Take 10 mg by mouth at bedtime.   MULTIVITAMIN ADULT PO Take by mouth 2 (two) times daily.   mupirocin ointment 2 % Commonly known as: BACTROBAN Apply 1 application topically 2 (two) times daily. Apply to affected area of forehead   rOPINIRole 1 MG tablet Commonly known as: REQUIP Take 1.5 mg by mouth at bedtime.   tapentadol 50 MG tablet Commonly known as: NUCYNTA Take 50 mg by mouth.   traMADol 50 MG tablet Commonly known as: ULTRAM Take 1 tablet by mouth every 8 (eight) hours as needed.   triamcinolone cream 0.1 % Commonly known as: KENALOG Apply a small amount to affected area 1 to 2 times daily as needed for itchy areas on back, Avoid face, groin, and underarms. NEEDS APPT FOR FURTHER REFILLS   triamcinolone cream 0.1 % Commonly known as: KENALOG Apply 1 application topically 2 (two) times daily as needed (Rash). To affected areas of hands and back until rash cleared   Vitamin D (Ergocalciferol) 1.25 MG (50000 UNIT) Caps capsule Commonly known as: DRISDOL Take 50,000 Units by mouth 2 (two) times a week.               Discharge Care Instructions  (From  admission, onward)           Start     Ordered   12/11/20 0000  Discharge wound care:       Comments: Follow with visitng RN   12/11/20 1052            Follow-up Information     Katrinka Blazing, MD Follow up in 1 week(s).   Contact information: [id number] [facility] [address] [phone]         Lynn Ito, MD Follow up in 1 week(s).   Specialty: Infectious Diseases Contact information: [address] [place] [address] [phone]                Signed: Marrion Coy 12/11/2020, 10:52 AM

## 2020-12-11 NOTE — Discharge Instructions (Signed)
[facility] (providers: Dr. Tedd Sias, Dr. Gershon Crane, and Rockey Situ, NP) [address] [place], [place], [place] [address] Phone: [phone]
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
## 2020-12-11 NOTE — TOC Transition Note (Signed)
Transition of Care [facility]) - CM/SW Discharge Note   Patient Details  Name: BRIANAH HOPSON MRN: [id number] Date of Birth: 09/08/1950  [facility] CM/SW Contact:  Allayne Butcher, RN Phone Number: 12/11/2020, 1:42 PM   Clinical Narrative:    Patient placed under observation for hypoglycemia and wound from insulin pump.  Patient is now medically cleared for discharge home with home health services.  RNCM spoke with patient and husband via phone in the room.  Patient is from home with her husband, she is agreeable to home health and chooses [facility].  Barbara Cower with [facility] accepted [facility] referral for RN and PT.  Patient's husband will transport her home today.  She has a cane at home that she uses.  She is current with PCP.     Final next level of care: Home w Home Health Services Barriers to Discharge: Barriers Resolved   Patient Goals and CMS Choice Patient states their goals for this hospitalization and ongoing recovery are:: wants some help at home- home health ordered CMS Medicare.gov Compare Post Acute Care list provided to:: Patient Choice offered to / list presented to : Patient  Discharge Placement                       Discharge Plan and Services   Discharge Planning Services: CM Consult Post Acute Care Choice: Home Health          DME Arranged: N/A DME Agency: NA       HH Arranged: RN, PT HH Agency: [facility] Date HH Agency Contacted: 12/11/20 Time HH Agency Contacted: 1341 Representative spoke with at [facility] Agency: Barbara Cower  Social Determinants of Health (SDOH) Interventions     Readmission Risk Interventions No flowsheet data found.

## 2020-12-11 NOTE — Progress Notes (Signed)
Received MD order to discharge patient to home with home health, reviewed discharge instructions, follow up appointments, home meds, prescriptions and wound care with patient and patient verbalized understanding.

## 2020-12-11 NOTE — Progress Notes (Addendum)
Inpatient Diabetes Program Recommendations  AACE/ADA: New Consensus Statement on Inpatient Glycemic Control   Target Ranges:  Prepandial:   less than 140 mg/dL      Peak postprandial:   less than 180 mg/dL (1-2 hours)      Critically ill patients:  140 - 180 mg/dL    Latest Reference Range & Units 12/11/20 04:40 12/11/20 05:00 12/11/20 05:37  Glucose-Capillary 70 - 99 mg/dL 34 (LL) 55 (L) 793 (H)    Latest Reference Range & Units 12/10/20 06:50 12/10/20 07:48 12/10/20 11:22 12/10/20 11:56 12/10/20 13:19 12/10/20 16:07 12/10/20 21:18 12/10/20 23:55  Glucose-Capillary 70 - 99 mg/dL 35 (LL) 903 (H) 57 (L) 109 (H) 75  Insulin pump suspended by pt 204 (H)   Novolog 2 units@16 :22 231 (H)  Novolog 2 units@22 :42 283 (H)  Novolog 2 units@00 :09    Review of Glycemic Control  Diabetes history: DM1 Outpatient Diabetes medications: OmniPod Insulin Pump with Novolog Current orders for Inpatient glycemic control: Novolog 0-5 units Q4H  Inpatient Diabetes Program Recommendations:    Hypoglycemia: Noted patient received Novolog 2 units at 22:42 and Novolog 3 units at 00:09.  Novolog insulin correction given within 1 1/2 hours of each other (ordered Q4H) which lead to hypoglycemia this morning (glucose 34 mg/dl at 0:09). Recommend to continue Novolog correction Q4H until patient is discharged and then patient can resume insulin pump at discharge.   Outpatient: Patient needs to follow up with Endocrinologist (Dr. Monia Sabal) for insulin pump adjustments due to recurrent hypoglycemia noted yesterday while insulin pump was on and delivering basal insulin.  Addendum 12/11/20@10 :65- Talked with patient at bedside. Patient reports she restarted her basal rates around midnight when glucose was in the 200's mg/dl. Explained that since she got Novolog correction at 22:42 and Novolog correction again at 00:09 (doses given within 1 1/2 hours of each other) and restarted her basal rates, anticipate that is why  glucose was 34 mg/dl this morning at 2:33 am.  Pt states that her glucometer she uses at home is at home so not able to review glucose trends in glucometer and her PDM device that works with her OmniPod insulin pump is dead and she is charging it. Pt states she knows how to decrease her basal rates slightly or temporarily if she is running low. Pt reports that she does not have issues with lows at home unless she gives herself insulin for carbs and then doesn't eat them.  Expressed concern about recurrent hypoglycemia yesterday and informed patient that I have called her Endocrinologist and left a message to make Dr. Monia Sabal aware that she has wound from prior OmniPod site and about recurrent hypoglycemia to see if they recommend any adjustments with insulin pump settings. Patient reports that she has an appointment with Dr. Monia Sabal on December 27th. Patient reports that her providers are in [facility] and it is hard for her to get to appointments now so she is planning to find an Actor in [place]. Informed patient that [facility] has several providers (Dr. Tedd Sias, Dr. Gershon Crane, and Otelia Sergeant, NP) and she could call to see if she can get established with them. Will add names and contact information to discharge notes in chart. Patient verbalized understanding of information discussed and states that she has no questions at this time.  Thanks, Orlando Penner, RN, MSN, CDE Diabetes Coordinator Inpatient Diabetes Program [phone] (Team Pager from 8am to 5pm)

## 2020-12-11 NOTE — Consult Note (Signed)
WOC Nurse Consult Note: Patient receiving care in [facility]. Reason for Consult: right thigh wound Wound type: infectious--former insulin pump injection site. Pressure Injury POA: Yes/No/NA Measurement: Wound bed: see photo Drainage (amount, consistency, odor)  Periwound: Dressing procedure/placement/frequency: Insert 1/4 inch Iodoform strip [place] [phone]) into the right thigh wound. Leave a piece hanging out for easy removal. Cover with a small foam dressing or dry gauze. Change daily.  Thank you for the consult. WOC nurse will not follow at this time.  Please re-consult the WOC team if needed.  Helmut Muster, RN, MSN, CWOCN, CNS-BC, pager [phone]

## 2020-12-11 NOTE — Progress Notes (Signed)
Blood glucose 34 this morning. Given two grape juices and two orange juices.  On recheck blood glucose 126.

## 2020-12-12 LAB — AEROBIC CULTURE W GRAM STAIN (SUPERFICIAL SPECIMEN)

## 2020-12-15 LAB — CULTURE, BLOOD (ROUTINE X 2)
Culture: NO GROWTH
Culture: NO GROWTH
Special Requests: ADEQUATE

## 2020-12-17 ENCOUNTER — Other Ambulatory Visit
Admission: RE | Admit: 2020-12-17 | Discharge: 2020-12-17 | Disposition: A | Discharge status: Home / Self Care | Payer: Medicare Other | Source: Ambulatory Visit | Attending: Infectious Diseases | Admitting: Infectious Diseases

## 2020-12-17 ENCOUNTER — Ambulatory Visit: Payer: Medicare Other | Attending: Infectious Diseases | Admitting: Infectious Diseases

## 2020-12-17 ENCOUNTER — Other Ambulatory Visit: Payer: Self-pay

## 2020-12-17 ENCOUNTER — Telehealth: Payer: Self-pay

## 2020-12-17 VITALS — BP 160/77 | HR 73 | Resp 16 | Ht 68.0 in | Wt 119.0 lb

## 2020-12-17 DIAGNOSIS — Z794 Long term (current) use of insulin: Secondary | ICD-10-CM | POA: Diagnosis not present

## 2020-12-17 DIAGNOSIS — A5216 Charcot's arthropathy (tabetic): Secondary | ICD-10-CM | POA: Diagnosis not present

## 2020-12-17 DIAGNOSIS — Z9641 Presence of insulin pump (external) (internal): Secondary | ICD-10-CM | POA: Insufficient documentation

## 2020-12-17 DIAGNOSIS — J449 Chronic obstructive pulmonary disease, unspecified: Secondary | ICD-10-CM | POA: Diagnosis not present

## 2020-12-17 DIAGNOSIS — A4902 Methicillin resistant Staphylococcus aureus infection, unspecified site: Secondary | ICD-10-CM | POA: Diagnosis present

## 2020-12-17 DIAGNOSIS — F17210 Nicotine dependence, cigarettes, uncomplicated: Secondary | ICD-10-CM | POA: Diagnosis not present

## 2020-12-17 DIAGNOSIS — T63301D Toxic effect of unspecified spider venom, accidental (unintentional), subsequent encounter: Secondary | ICD-10-CM | POA: Diagnosis not present

## 2020-12-17 DIAGNOSIS — B9562 Methicillin resistant Staphylococcus aureus infection as the cause of diseases classified elsewhere: Secondary | ICD-10-CM | POA: Diagnosis not present

## 2020-12-17 DIAGNOSIS — L0889 Other specified local infections of the skin and subcutaneous tissue: Secondary | ICD-10-CM | POA: Diagnosis not present

## 2020-12-17 DIAGNOSIS — E1140 Type 2 diabetes mellitus with diabetic neuropathy, unspecified: Secondary | ICD-10-CM | POA: Diagnosis not present

## 2020-12-17 DIAGNOSIS — Z96642 Presence of left artificial hip joint: Secondary | ICD-10-CM | POA: Diagnosis not present

## 2020-12-17 LAB — MRSA NEXT GEN BY PCR, NASAL: MRSA by PCR Next Gen: NOT DETECTED

## 2020-12-17 NOTE — Telephone Encounter (Signed)
Patient left voicemail wanting to know what bacteria she has as she can't remember from her appointment today and wants to look it up. 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 Patient verbalized understanding and has no further questions.   Sandie Ano, RN

## 2020-12-17 NOTE — Progress Notes (Signed)
NAME: Suzanne Snyder  DOB: 05/29/1950  MRN: [id number]  Date/Time: 12/17/2020 1:06 PM  Subjective:   Suzanne Snyder is a 70 y.o. with a history of diabetes mellitus on insulin pump, COPD, current smoker, diabetic neuropathy and Charcot foot  right, left total hip arthroplasty Is referred to me after she was recently in the ED on 12/10/2020 for  right thigh soft tissue infection abscess for which she had I/D and cultures were sent and she was given a dose of dalbavancin and sent home. Today she is doing much better the The swelling on the right thigh is improved a lot She is packing the small wound by herself. Patient says she had her insulin pump on the right leg and that is now being switched over to the left leg. In May of this year she was in the ED complaining of a wound on the face which she thought was due to a spider bite while sleeping.  She was treated with Keflex and Bactrim for facial cellulitis.  No culture was done in the ED.  Apparently she has had a few lesions before  The culture from 12/10/2020 is MRSA  Past Medical History:  Diagnosis Date   Anxiety    Arthritis    Asthma    Diabetes mellitus without complication (HCC)    Insulin pump in place    Osteoporosis    RLS (restless legs syndrome)    Smokers' cough (HCC)    Wears dentures    full upper and lower    Past Surgical History:  Procedure Laterality Date   CATARACT EXTRACTION W/PHACO Right 02/20/2020   Procedure: CATARACT EXTRACTION PHACO AND INTRAOCULAR LENS PLACEMENT (IOC) RIGHT 5.60 00:36.3;  Surgeon: Galen Manila, MD;  Location: [facility];  Service: Ophthalmology;  Laterality: Right;  Diabetic - insulin pump Requests not first   CATARACT EXTRACTION W/PHACO Left 03/05/2020   Procedure: CATARACT EXTRACTION PHACO AND INTRAOCULAR LENS PLACEMENT (IOC) LEFT 4.94 00:31.6;  Surgeon: Galen Manila, MD;  Location: [facility];  Service: Ophthalmology;  Laterality: Left;   JOINT REPLACEMENT  Left 2007   total hip   ROTATOR CUFF REPAIR Left 2014 and 2015   SPINE SURGERY N/A 1984   L4-5, not sure of procedure   THYROID SURGERY      Social History   Socioeconomic History   Marital status: Married    Spouse name: Not on file   Number of children: Not on file   Years of education: Not on file   Highest education level: Not on file  Occupational History   Not on file  Tobacco Use   Smoking status: Every Day    Packs/day: 1.00    Years: 50.00    Pack years: 50.00    Types: Cigarettes   Smokeless tobacco: Never   Tobacco comments:    started smoking age 30  Vaping Use   Vaping Use: Former   Devices: "tried itChiropractor and Sexual Activity   Alcohol use: No   Drug use: No   Sexual activity: Not on file  Other Topics Concern   Not on file  Social History Narrative   Not on file   Social Determinants of Health   Financial Resource Strain: Not on file  Food Insecurity: Not on file  Transportation Needs: Not on file  Physical Activity: Not on file  Stress: Not on file  Social Connections: Not on file  Intimate Partner Violence: Not on file    Family  History  Problem Relation Age of Onset   Parkinsonism Father    Allergies  Allergen Reactions   Pregabalin Anaphylaxis   Erythromycin Other (See Comments)    Severe stomach upset   I? Current Outpatient Medications  Medication Sig Dispense Refill   acetaminophen (TYLENOL) 325 MG tablet Take 650 mg by mouth every 6 (six) hours as needed.     albuterol (VENTOLIN HFA) 108 (90 Base) MCG/ACT inhaler Inhale into the lungs every 6 (six) hours as needed for wheezing or shortness of breath.     ammonium lactate (LAC-HYDRIN) 12 % cream Apply topically as needed for dry skin. Apply to affected areas of hands and feet 385 g 11   aspirin 81 MG EC tablet Take 81 mg by mouth daily. Swallow whole.     celecoxib (CELEBREX) 100 MG capsule Take 100 mg by mouth 2 (two) times daily.     ciclopirox (LOPROX) 0.77 % cream APPLY  A SMALL AMOUNT TO AFFECTED AREA TWICE A DAY FOR FUNGAL INFECTION OF THE FEET. APPLY BETWEEN TOES AND ON TOENAILS 90 g 2   clonazePAM (KLONOPIN) 0.5 MG tablet Take 0.5 mg by mouth 2 (two) times daily as needed for anxiety.     cyclobenzaprine (FLEXERIL) 10 MG tablet Take 10 mg by mouth 3 (three) times daily as needed for muscle spasms.     diphenhydrAMINE HCl (BENADRYL ALLERGY PO) Take 50 mg by mouth as needed.     gabapentin (NEURONTIN) 300 MG capsule Take 300 mg by mouth 4 (four) times daily.     hydrOXYzine (ATARAX/VISTARIL) 10 MG tablet TAKE 1 OR 2 TABLET BY MOUTH THREE TIMES DAILY AS NEEDED FOR ITCH 90 tablet 1   insulin aspart (NOVOLOG) 100 UNIT/ML injection Inject into the skin 4 (four) times daily as needed for high blood sugar (with meals and at bedtime).     ipratropium (ATROVENT) 0.02 % nebulizer solution Take 0.5 mg by nebulization every 6 (six) hours as needed for wheezing or shortness of breath.     montelukast (SINGULAIR) 10 MG tablet Take 10 mg by mouth at bedtime.     Multiple Vitamin (MULTIVITAMIN ADULT PO) Take by mouth 2 (two) times daily.     Omega-3 Fatty Acids (FISH OIL) 1000 MG CAPS Take 1.5 capsules by mouth 2 (two) times daily.     rOPINIRole (REQUIP) 1 MG tablet Take 1.5 mg by mouth at bedtime.     triamcinolone (KENALOG) 0.1 % Apply 1 application topically 2 (two) times daily as needed (Rash). To affected areas of hands and back until rash cleared 80 g 2   triamcinolone cream (KENALOG) 0.1 % Apply a small amount to affected area 1 to 2 times daily as needed for itchy areas on back, Avoid face, groin, and underarms. NEEDS APPT FOR FURTHER REFILLS 80 g 0   Vitamin D, Ergocalciferol, (DRISDOL) 50000 UNITS CAPS capsule Take 50,000 Units by mouth 2 (two) times a week.     lidocaine (LIDODERM) 5 % Place 1 patch onto the skin daily as needed. Remove & Discard patch within 12 hours or as directed by MD (Patient not taking: Reported on 12/17/2020)     mupirocin ointment (BACTROBAN) 2  % Apply 1 application topically 2 (two) times daily. Apply to affected area of forehead (Patient not taking: Reported on 12/17/2020) 22 g 0   tapentadol (NUCYNTA) 50 MG TABS tablet Take 50 mg by mouth. (Patient not taking: Reported on 12/17/2020)     traMADol (ULTRAM) 50 MG tablet  Take 1 tablet by mouth every 8 (eight) hours as needed. (Patient not taking: Reported on 12/17/2020)     No current facility-administered medications for this visit.     Abtx:  Anti-infectives (From admission, onward)    None       REVIEW OF SYSTEMS:  Const: negative fever, negative chills, negative weight loss Eyes: negative diplopia or visual changes, negative eye pain ENT: negative coryza, negative sore throat Resp: Has cough, hemoptysis, dyspnea Cards: negative for chest pain, palpitations, lower extremity edema GU: negative for frequency, dysuria and hematuria GI: Negative for abdominal pain, diarrhea, bleeding, constipation Skin: negative for rash and pruritus Heme: negative for easy bruising and gum/nose bleeding MS: Pain in many joints including right shoulder and right foot  Neurolo: Has peripheral neuropathy  Psych: negative for feelings of anxiety, depression  Endocrine: Has diabetes and thyroid problems Allergy/Immunology-as above Objective:  VITALS:  BP (!) 160/77   Pulse 73   Resp 16   Ht 5\' 8"  (1.727 m)   Wt 119 lb (54 kg)   SpO2 97%   BMI 18.09 kg/m  PHYSICAL EXAM:  General: Alert, cooperative, no distress, appears stated age.  Examination of the face is limited because of the mask and her wearing hat Head: Normocephalic, without obvious abnormality, atraumatic. Eyes: Conjunctivae clear, anicteric sclerae. Pupils are equal ENT Nares normal. No drainage or sinus tenderness. Lips, mucosa, and tongue normal. No Thrush Neck: Supple, symmetrical, no adenopathy, thyroid: non tender no carotid bruit and no JVD. Lungs: Clear to auscultation bilaterally. No Wheezing or Rhonchi. No  rales. Heart: Regular rate and rhythm, no murmur, rub or gallop. Abdomen: Not examined  extremities: Right thigh.  Small I&D site no surrounding cellulitis or erythema or tenderness  12/17/20  12/10/20  Left thigh insulin pump present skin: No rashes or lesions. Or bruising Lymph: Cervical, supraclavicular normal. Neurologic: Grossly non-focal Pertinent Labs Lab Results CBC    Component Value Date/Time   WBC 6.9 12/11/2020 0529   RBC 4.08 12/11/2020 0529   HGB 12.6 12/11/2020 0529   HCT 38.8 12/11/2020 0529   PLT 249 12/11/2020 0529   MCV 95.1 12/11/2020 0529   MCH 30.9 12/11/2020 0529   MCHC 32.5 12/11/2020 0529   RDW 14.6 12/11/2020 0529   LYMPHSABS 3.0 12/09/2020 2334   MONOABS 0.6 12/09/2020 2334   EOSABS 0.4 12/09/2020 2334   BASOSABS 0.1 12/09/2020 2334    CMP Latest Ref Rng & Units 12/11/2020 12/09/2020 05/20/2020  Glucose 70 - 99 mg/dL 81 269(H) 59(L)  BUN 8 - 23 mg/dL 15 15 14   Creatinine 0.44 - 1.00 mg/dL 0.74 0.74 0.68  Sodium 135 - 145 mmol/L 132(L) 131(L) 137  Potassium 3.5 - 5.1 mmol/L 3.6 3.8 3.9  Chloride 98 - 111 mmol/L 104 103 107  CO2 22 - 32 mmol/L 22 20(L) 23  Calcium 8.9 - 10.3 mg/dL 8.1(L) 8.3(L) 8.8(L)  Total Protein 6.5 - 8.1 g/dL - 6.9 -  Total Bilirubin 0.3 - 1.2 mg/dL - 0.8 -  Alkaline Phos 38 - 126 U/L - 56 -  AST 15 - 41 U/L - 20 -  ALT 0 - 44 U/L - 17 -      Microbiology: Recent Results (from the past 240 hour(s))  Blood culture (routine x 2)     Status: None   Collection Time: 12/10/20  9:15 AM   Specimen: BLOOD  Result Value Ref Range Status   Specimen Description BLOOD LEFT HAND  Final   Special Requests  Final    BOTTLES DRAWN AEROBIC AND ANAEROBIC Blood Culture results may not be optimal due to an inadequate volume of blood received in culture bottles   Culture   Final    NO GROWTH 5 DAYS Performed at [facility], [address] [place], [place] [address]    Report Status 12/15/2020 FINAL  Final  Blood  culture (routine x 2)     Status: None   Collection Time: 12/10/20  9:25 AM   Specimen: BLOOD  Result Value Ref Range Status   Specimen Description BLOOD LEFT [facility]  Final   Special Requests   Final    BOTTLES DRAWN AEROBIC AND ANAEROBIC Blood Culture adequate volume   Culture   Final    NO GROWTH 5 DAYS Performed at [facility], [address] [place], [place] [address]    Report Status 12/15/2020 FINAL  Final  Aerobic Culture w Gram Stain (superficial specimen)     Status: None   Collection Time: 12/10/20 11:28 AM   Specimen: Wound  Result Value Ref Range Status   Specimen Description   Final    WOUND Performed at [facility], [address] [place], [place] [address]    Special Requests   Final    NONE Performed at [facility], [address] [place], [place] [address]    Gram Stain   Final    FEW WBC PRESENT,BOTH PMN AND MONONUCLEAR NO ORGANISMS SEEN Performed at [facility],  [address] [place], [place] [address]    Culture FEW METHICILLIN RESISTANT STAPHYLOCOCCUS AUREUS  Final   Report Status 12/12/2020 FINAL  Final   Organism ID, Bacteria METHICILLIN RESISTANT STAPHYLOCOCCUS AUREUS  Final      Susceptibility   Methicillin resistant staphylococcus aureus - MIC*    CIPROFLOXACIN >=8 RESISTANT Resistant     ERYTHROMYCIN <=0.25 SENSITIVE Sensitive     GENTAMICIN <=0.5 SENSITIVE Sensitive     OXACILLIN >=4 RESISTANT Resistant     TETRACYCLINE <=1 SENSITIVE Sensitive     VANCOMYCIN <=0.5 SENSITIVE Sensitive     TRIMETH/SULFA <=10 SENSITIVE Sensitive     CLINDAMYCIN <=0.25 SENSITIVE Sensitive     RIFAMPIN <=0.5 SENSITIVE Sensitive     Inducible Clindamycin NEGATIVE Sensitive     * FEW METHICILLIN RESISTANT STAPHYLOCOCCUS AUREUS  Resp Panel by RT-PCR (Flu A&B, Covid) Nasopharyngeal Swab     Status: None   Collection Time: 12/10/20  1:20 PM   Specimen: Nasopharyngeal Swab; Nasopharyngeal(NP) swabs in vial transport medium   Result Value Ref Range Status   SARS Coronavirus 2 by RT PCR NEGATIVE NEGATIVE Final    Comment: (NOTE) SARS-CoV-2 target nucleic acids are NOT DETECTED.  The SARS-CoV-2 RNA is generally detectable in upper respiratory specimens during the acute phase of infection. The lowest concentration of SARS-CoV-2 viral copies this assay can detect is 138 copies/mL. A negative result does not preclude SARS-Cov-2 infection and should not be used as the sole basis for treatment or other patient management decisions. A negative result may occur with  improper specimen collection/handling, submission of specimen other than nasopharyngeal swab, presence of viral mutation(s) within the areas targeted by this assay, and inadequate number of viral copies(<138 copies/mL). A negative result must be combined with clinical observations, patient history, and epidemiological information. The expected result is Negative.  Fact Sheet for Patients:  EntrepreneurPulse.com.au  Fact Sheet for Healthcare Providers:  IncredibleEmployment.be  This test is no t yet approved or cleared by the [place]  States FDA and  has been authorized for detection and/or diagnosis of SARS-CoV-2 by FDA under an Emergency Use Authorization (EUA). This EUA will remain  in effect (meaning this test can be used) for the duration of the COVID-19 declaration under Section 564(b)(1) of the Act, 21 U.S.C.section 360bbb-3(b)(1), unless the authorization is terminated  or revoked sooner.       Influenza A by PCR NEGATIVE NEGATIVE Final   Influenza B by PCR NEGATIVE NEGATIVE Final    Comment: (NOTE) The Xpert Xpress SARS-CoV-2/FLU/RSV plus assay is intended as an aid in the diagnosis of influenza from Nasopharyngeal swab specimens and should not be used as a sole basis for treatment. Nasal washings and aspirates are unacceptable for Xpert Xpress SARS-CoV-2/FLU/RSV testing.  Fact Sheet for  Patients: EntrepreneurPulse.com.au  Fact Sheet for Healthcare Providers: IncredibleEmployment.be  This test is not yet approved or cleared by the [place] FDA and has been authorized for detection and/or diagnosis of SARS-CoV-2 by FDA under an Emergency Use Authorization (EUA). This EUA will remain in effect (meaning this test can be used) for the duration of the COVID-19 declaration under Section 564(b)(1) of the Act, 21 U.S.C. section 360bbb-3(b)(1), unless the authorization is terminated or revoked.  Performed at [facility], [address] [place], [place] [address]    ? Impression/Recommendation MRSA skin and soft tissue infection of the right thigh at the site of the insulin pump Patient underwent IND on 12/10/2020 Was given a dose of dalbavancin 1500 mg which last for 2 weeks.  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 Explained to her that she may have t to be off the insulin pump after discussing with her endocrinologist but for now I will swab her nares to look for colonization.  Also she will be throwing her old gloves and other paraphernalia that she uses for maintenance of the insulin pump  Current smoker.  Asked her to quit smoking as the infection can perpetuate  COPD  Diabetes mellitus on insulin Says her sugars been running high  Discussed the management in detail with with her and her husband who had accompanied her during the visit.  ___________________________________________________  Note:  This document was prepared using Dragon voice recognition software and may include unintentional dictation errors.

## 2020-12-17 NOTE — Patient Instructions (Addendum)
You have received Dalbavancin on 12/10/20 for an infection rt thigh abscess. I/D done on 11/29- you have MRSA .to day I checked your nose to see whether you are colonized with bacteria- will discuss after that.you dont need any more antibiotic

## 2020-12-18 ENCOUNTER — Encounter: Payer: Self-pay | Admitting: Infectious Diseases

## 2020-12-20 ENCOUNTER — Telehealth: Payer: Self-pay

## 2020-12-20 NOTE — Telephone Encounter (Signed)
Advised results from Nares swab Negative. Patient is in great deal of pain. She said her shoulder doctor states he can not help her as this issue is the neck not shoulder. Patient is reaching out to PCP to see what next steps are for the pain. She states she can not even get out of bed. She will await instructions or referrals from PCP or go to ED if pain is worse over weekend.

## 2020-12-21 ENCOUNTER — Other Ambulatory Visit: Payer: Self-pay

## 2020-12-21 ENCOUNTER — Emergency Department: Payer: Medicare Other

## 2020-12-21 ENCOUNTER — Inpatient Hospital Stay: Payer: Medicare Other

## 2020-12-21 ENCOUNTER — Inpatient Hospital Stay
Admission: EM | Admit: 2020-12-21 | Discharge: 2020-12-25 | DRG: 603 | Disposition: A | Discharge status: Home / Self Care | Payer: Medicare Other | Attending: Internal Medicine | Admitting: Internal Medicine

## 2020-12-21 ENCOUNTER — Encounter: Payer: Self-pay | Admitting: Intensive Care

## 2020-12-21 DIAGNOSIS — J449 Chronic obstructive pulmonary disease, unspecified: Secondary | ICD-10-CM | POA: Diagnosis present

## 2020-12-21 DIAGNOSIS — Z9841 Cataract extraction status, right eye: Secondary | ICD-10-CM

## 2020-12-21 DIAGNOSIS — E1042 Type 1 diabetes mellitus with diabetic polyneuropathy: Secondary | ICD-10-CM | POA: Diagnosis present

## 2020-12-21 DIAGNOSIS — L03113 Cellulitis of right upper limb: Principal | ICD-10-CM

## 2020-12-21 DIAGNOSIS — M542 Cervicalgia: Secondary | ICD-10-CM | POA: Diagnosis present

## 2020-12-21 DIAGNOSIS — J45909 Unspecified asthma, uncomplicated: Secondary | ICD-10-CM | POA: Diagnosis not present

## 2020-12-21 DIAGNOSIS — G2581 Restless legs syndrome: Secondary | ICD-10-CM | POA: Diagnosis present

## 2020-12-21 DIAGNOSIS — Z7982 Long term (current) use of aspirin: Secondary | ICD-10-CM

## 2020-12-21 DIAGNOSIS — F419 Anxiety disorder, unspecified: Secondary | ICD-10-CM | POA: Diagnosis present

## 2020-12-21 DIAGNOSIS — M1120 Other chondrocalcinosis, unspecified site: Secondary | ICD-10-CM | POA: Diagnosis not present

## 2020-12-21 DIAGNOSIS — Z79899 Other long term (current) drug therapy: Secondary | ICD-10-CM

## 2020-12-21 DIAGNOSIS — Z881 Allergy status to other antibiotic agents status: Secondary | ICD-10-CM

## 2020-12-21 DIAGNOSIS — X58XXXD Exposure to other specified factors, subsequent encounter: Secondary | ICD-10-CM | POA: Diagnosis present

## 2020-12-21 DIAGNOSIS — E1065 Type 1 diabetes mellitus with hyperglycemia: Secondary | ICD-10-CM | POA: Diagnosis present

## 2020-12-21 DIAGNOSIS — Z888 Allergy status to other drugs, medicaments and biological substances status: Secondary | ICD-10-CM | POA: Diagnosis not present

## 2020-12-21 DIAGNOSIS — G8929 Other chronic pain: Secondary | ICD-10-CM | POA: Diagnosis present

## 2020-12-21 DIAGNOSIS — Z820 Family history of epilepsy and other diseases of the nervous system: Secondary | ICD-10-CM

## 2020-12-21 DIAGNOSIS — Z20822 Contact with and (suspected) exposure to covid-19: Secondary | ICD-10-CM | POA: Diagnosis present

## 2020-12-21 DIAGNOSIS — Z96612 Presence of left artificial shoulder joint: Secondary | ICD-10-CM | POA: Diagnosis present

## 2020-12-21 DIAGNOSIS — M810 Age-related osteoporosis without current pathological fracture: Secondary | ICD-10-CM | POA: Diagnosis present

## 2020-12-21 DIAGNOSIS — M25511 Pain in right shoulder: Secondary | ICD-10-CM | POA: Diagnosis present

## 2020-12-21 DIAGNOSIS — M11211 Other chondrocalcinosis, right shoulder: Secondary | ICD-10-CM | POA: Diagnosis present

## 2020-12-21 DIAGNOSIS — F17210 Nicotine dependence, cigarettes, uncomplicated: Secondary | ICD-10-CM | POA: Diagnosis present

## 2020-12-21 DIAGNOSIS — E1142 Type 2 diabetes mellitus with diabetic polyneuropathy: Secondary | ICD-10-CM

## 2020-12-21 DIAGNOSIS — E10610 Type 1 diabetes mellitus with diabetic neuropathic arthropathy: Secondary | ICD-10-CM | POA: Diagnosis present

## 2020-12-21 DIAGNOSIS — J45902 Unspecified asthma with status asthmaticus: Secondary | ICD-10-CM | POA: Diagnosis not present

## 2020-12-21 DIAGNOSIS — Z9641 Presence of insulin pump (external) (internal): Secondary | ICD-10-CM | POA: Diagnosis present

## 2020-12-21 DIAGNOSIS — M1990 Unspecified osteoarthritis, unspecified site: Secondary | ICD-10-CM | POA: Diagnosis not present

## 2020-12-21 DIAGNOSIS — M25531 Pain in right wrist: Secondary | ICD-10-CM

## 2020-12-21 DIAGNOSIS — M659 Synovitis and tenosynovitis, unspecified: Secondary | ICD-10-CM | POA: Diagnosis present

## 2020-12-21 DIAGNOSIS — Z9842 Cataract extraction status, left eye: Secondary | ICD-10-CM | POA: Diagnosis not present

## 2020-12-21 DIAGNOSIS — Z96642 Presence of left artificial hip joint: Secondary | ICD-10-CM | POA: Diagnosis present

## 2020-12-21 DIAGNOSIS — E10649 Type 1 diabetes mellitus with hypoglycemia without coma: Secondary | ICD-10-CM

## 2020-12-21 DIAGNOSIS — L0390 Cellulitis, unspecified: Secondary | ICD-10-CM

## 2020-12-21 DIAGNOSIS — R636 Underweight: Secondary | ICD-10-CM | POA: Diagnosis present

## 2020-12-21 DIAGNOSIS — S71101D Unspecified open wound, right thigh, subsequent encounter: Secondary | ICD-10-CM

## 2020-12-21 DIAGNOSIS — M14671 Charcot's joint, right ankle and foot: Secondary | ICD-10-CM | POA: Diagnosis present

## 2020-12-21 DIAGNOSIS — Z681 Body mass index (BMI) 19 or less, adult: Secondary | ICD-10-CM | POA: Diagnosis not present

## 2020-12-21 DIAGNOSIS — L02415 Cutaneous abscess of right lower limb: Secondary | ICD-10-CM | POA: Diagnosis present

## 2020-12-21 DIAGNOSIS — E860 Dehydration: Secondary | ICD-10-CM | POA: Diagnosis present

## 2020-12-21 DIAGNOSIS — Z794 Long term (current) use of insulin: Secondary | ICD-10-CM

## 2020-12-21 DIAGNOSIS — Z961 Presence of intraocular lens: Secondary | ICD-10-CM | POA: Diagnosis present

## 2020-12-21 DIAGNOSIS — R600 Localized edema: Secondary | ICD-10-CM

## 2020-12-21 DIAGNOSIS — Z8614 Personal history of Methicillin resistant Staphylococcus aureus infection: Secondary | ICD-10-CM | POA: Diagnosis not present

## 2020-12-21 LAB — CBC WITH DIFFERENTIAL/PLATELET
Abs Immature Granulocytes: 0.05 10*3/uL (ref 0.00–0.07)
Basophils Absolute: 0.1 10*3/uL (ref 0.0–0.1)
Basophils Relative: 1 %
Eosinophils Absolute: 0.2 10*3/uL (ref 0.0–0.5)
Eosinophils Relative: 2 %
HCT: 41.8 % (ref 36.0–46.0)
Hemoglobin: 13.7 g/dL (ref 12.0–15.0)
Immature Granulocytes: 1 %
Lymphocytes Relative: 18 %
Lymphs Abs: 1.9 10*3/uL (ref 0.7–4.0)
MCH: 31.4 pg (ref 26.0–34.0)
MCHC: 32.8 g/dL (ref 30.0–36.0)
MCV: 95.9 fL (ref 80.0–100.0)
Monocytes Absolute: 1 10*3/uL (ref 0.1–1.0)
Monocytes Relative: 9 %
Neutro Abs: 7.4 10*3/uL (ref 1.7–7.7)
Neutrophils Relative %: 69 %
Platelets: 271 10*3/uL (ref 150–400)
RBC: 4.36 MIL/uL (ref 3.87–5.11)
RDW: 13.9 % (ref 11.5–15.5)
WBC: 10.5 10*3/uL (ref 4.0–10.5)
nRBC: 0 % (ref 0.0–0.2)

## 2020-12-21 LAB — COMPREHENSIVE METABOLIC PANEL
ALT: 15 U/L (ref 0–44)
AST: 16 U/L (ref 15–41)
Albumin: 4.1 g/dL (ref 3.5–5.0)
Alkaline Phosphatase: 65 U/L (ref 38–126)
Anion gap: 9 (ref 5–15)
BUN: 11 mg/dL (ref 8–23)
CO2: 23 mmol/L (ref 22–32)
Calcium: 8.6 mg/dL — ABNORMAL LOW (ref 8.9–10.3)
Chloride: 104 mmol/L (ref 98–111)
Creatinine, Ser: 0.67 mg/dL (ref 0.44–1.00)
GFR, Estimated: >60 mL/min (ref >60)
Glucose, Bld: 175 mg/dL — ABNORMAL HIGH (ref 70–99)
Potassium: 3.9 mmol/L (ref 3.5–5.1)
Sodium: 136 mmol/L (ref 135–145)
Total Bilirubin: 0.9 mg/dL (ref 0.3–1.2)
Total Protein: 7.5 g/dL (ref 6.5–8.1)

## 2020-12-21 LAB — GLUCOSE, CAPILLARY
Glucose-Capillary: 187 mg/dL — ABNORMAL HIGH (ref 70–99)
Glucose-Capillary: 220 mg/dL — ABNORMAL HIGH (ref 70–99)

## 2020-12-21 LAB — RESP PANEL BY RT-PCR (FLU A&B, COVID) ARPGX2
Influenza A by PCR: NEGATIVE
Influenza B by PCR: NEGATIVE
SARS Coronavirus 2 by RT PCR: NEGATIVE

## 2020-12-21 LAB — URINALYSIS, COMPLETE (UACMP) WITH MICROSCOPIC
Bilirubin Urine: NEGATIVE
Glucose, UA: NEGATIVE mg/dL
Hgb urine dipstick: NEGATIVE
Ketones, ur: 20 mg/dL — AB
Nitrite: NEGATIVE
Protein, ur: 30 mg/dL — AB
Specific Gravity, Urine: 1.019 (ref 1.005–1.030)
pH: 6 (ref 5.0–8.0)

## 2020-12-21 LAB — LACTIC ACID, PLASMA: Lactic Acid, Venous: 1.2 mmol/L (ref 0.5–1.9)

## 2020-12-21 LAB — SEDIMENTATION RATE: Sed Rate: 42 mm/hr — ABNORMAL HIGH (ref 0–30)

## 2020-12-21 LAB — URIC ACID: Uric Acid, Serum: 5.6 mg/dL (ref 2.5–7.1)

## 2020-12-21 IMAGING — MR MR WRIST*R* W/O CM
4 series · 40 of 40 positions shown · non-contrast
Comparison: X-ray [DATE]

CLINICAL DATA: Wrist pain, infection suspected.  No known injury.

EXAM:
MR OF THE RIGHT WRIST WITHOUT CONTRAST
TECHNIQUE: Multiplanar, multisequence MR imaging of the right wrist was
performed. No intravenous contrast was administered.

[Series 4: T2 fat-sat · axial · right · 2.0mm · 0.47mm/px · z∈[-64,-3]mm · 12 of 27 slices shown (1 of 2)]
[im 1/27]
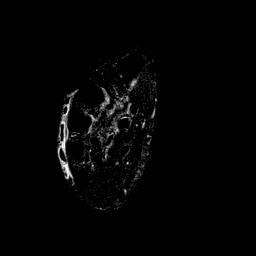
[im 3/27]
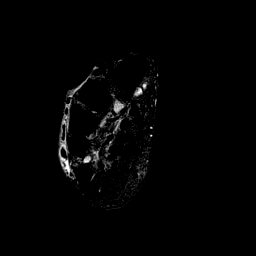
[im 5/27]
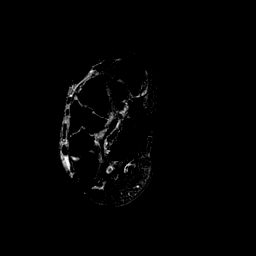
[im 8/27]
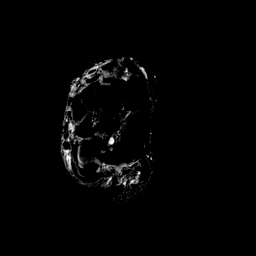
[im 10/27]
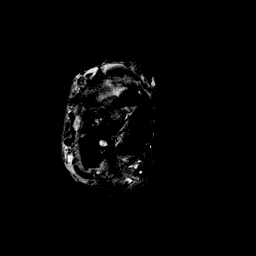
[im 12/27]
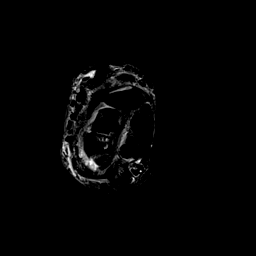
[im 15/27]
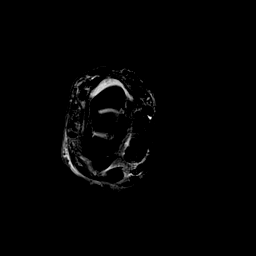
[im 17/27]
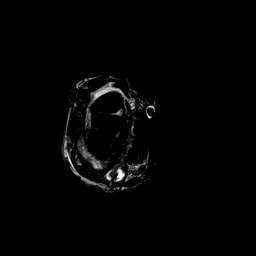
[im 19/27]
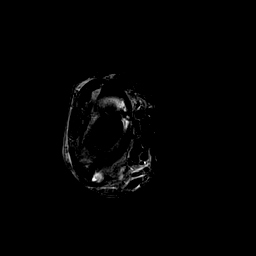
[im 22/27]
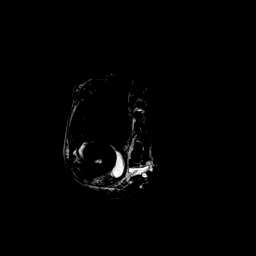
[im 24/27]
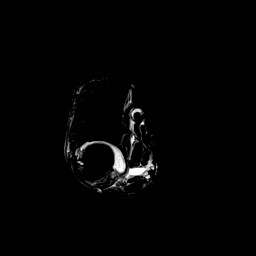
[im 27/27]
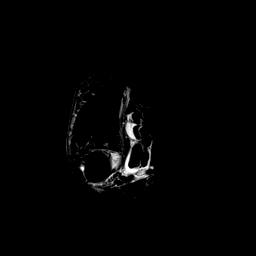

[Series 5: T1 · axial · right · 2.0mm · 0.47mm/px · z∈[-64,-1]mm · 12 of 28 slices shown (1 of 2)]
[im 1/28]
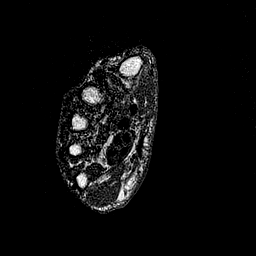
[im 3/28]
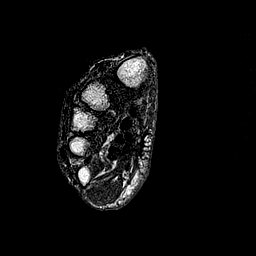
[im 5/28]
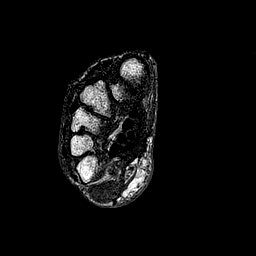
[im 8/28]
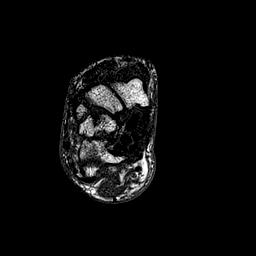
[im 10/28]
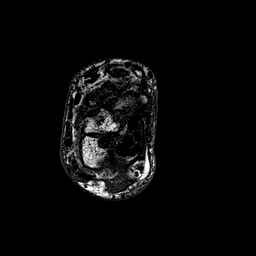
[im 13/28]
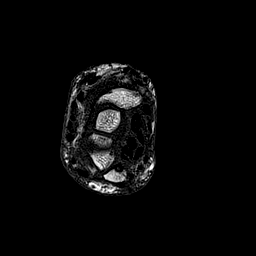
[im 15/28]
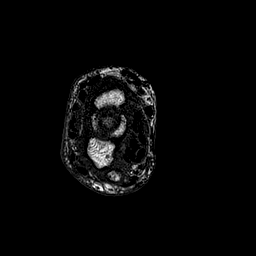
[im 18/28]
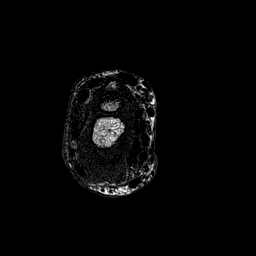
[im 20/28]
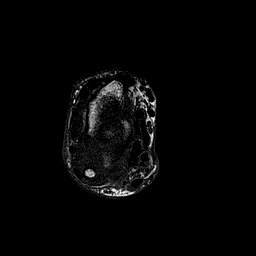
[im 23/28]
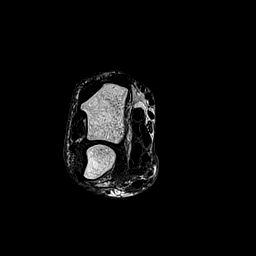
[im 25/28]
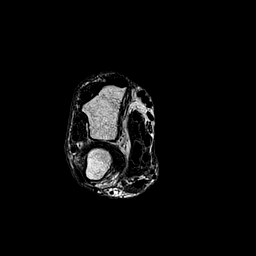
[im 28/28]
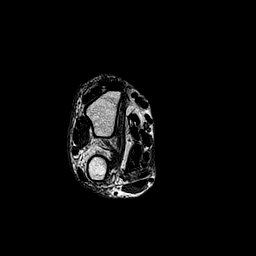

[Series 10: T1 · sagittal · right · 3.0mm · 0.39mm/px · 9 of 20 slices shown (2 of 2)]
[im 1/20]
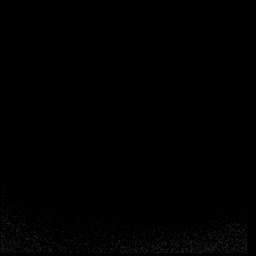
[im 3/20]
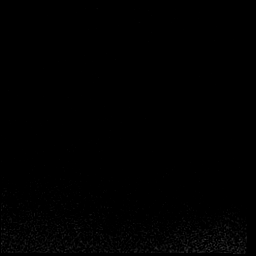
[im 5/20]
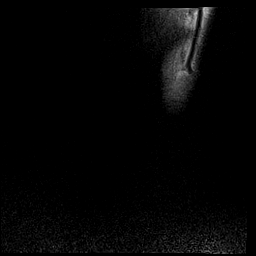
[im 8/20]
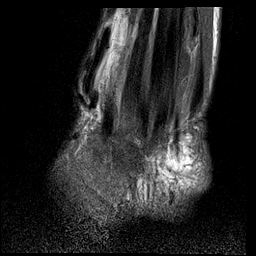
[im 10/20]
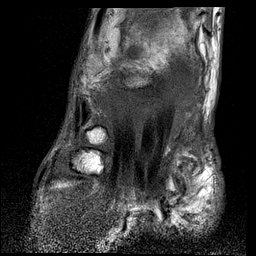
[im 12/20]
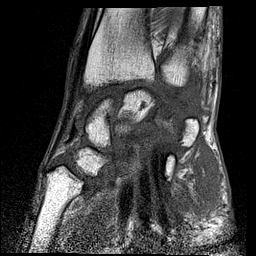
[im 15/20]
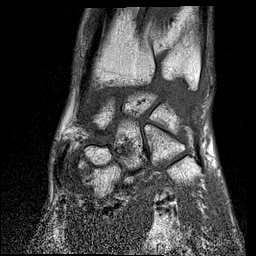
[im 17/20]
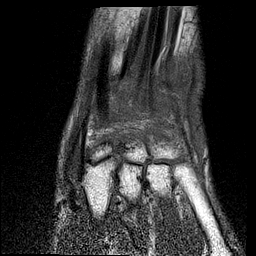
[im 20/20]
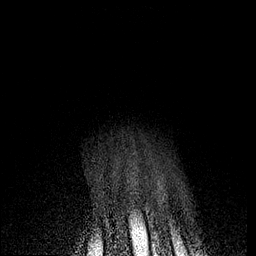

[Series 11: T2 fat-sat · sagittal · right · 3.0mm · 0.39mm/px · 7 of 16 slices shown (2 of 2)]
[im 1/16]
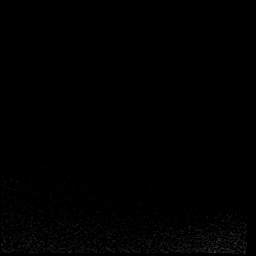
[im 3/16]
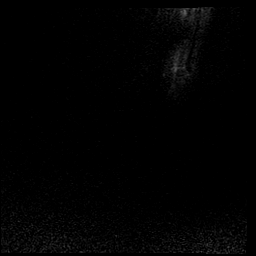
[im 6/16]
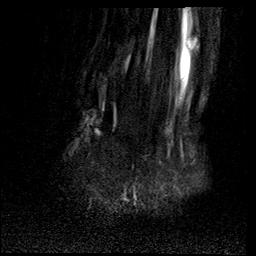
[im 8/16]
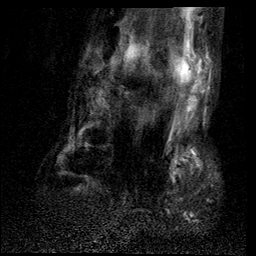
[im 11/16]
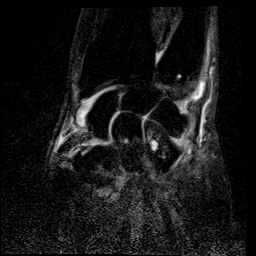
[im 13/16]
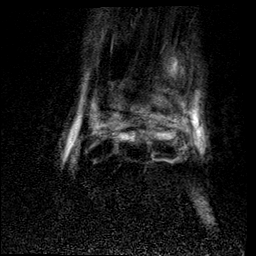
[im 16/16]
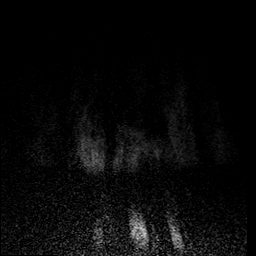

[40 of 40 positions shown; findings below may reference images not displayed]

FINDINGS: Motion degraded coronal and axial T1 and T2 fat saturated sequences
were obtained. Patient terminated the exam and no further sequences
were able to be obtained. Best possible images submitted for
interpretation.

Bones/Joint/Cartilage

No evidence of acute fracture or dislocation. Mild degenerative
changes, most pronounced at the first CMC joint. Small subchondral
cyst or intraosseous ganglion within the hamate. Patchy marrow edema
within the capitate and hamate, which may be degenerative. No focal
erosion evident. Small radiocarpal and distal radioulnar joint
effusions, nonspecific.

Ligaments

Intrinsic wrist ligaments appear grossly intact. Suspected tear of
the articular disc of the TFCC (series 11, image 8).

Muscles and Tendons

Tenosynovitis within the flexor tendons extending into the
visualized aspect of the distal forearm. Trace tenosynovitis
involving the third extensor compartment distal to the level of the
distal intersection.

Soft tissues

Mild soft tissue edema. No organized extra-articular fluid
collection is seen.
IMPRESSION: 1. Motion degraded, incomplete exam.
2. Tenosynovitis involving the flexor tendons extending into the
visualized aspect of the distal forearm. Trace tenosynovitis
involving the third extensor compartment. Findings are nonspecific
and could reflect infectious or inflammatory tenosynovitis.
3. Small radiocarpal and distal radioulnar joint effusions,
nonspecific. Septic arthritis not excluded.
4. Suspected tear of the articular disc of the TFCC.
5. No acute osseous findings.

## 2020-12-21 IMAGING — DX DG WRIST COMPLETE 3+V*R*
4 series · 4 of 4 positions shown · non-contrast
Comparison: None.

CLINICAL DATA: Swelling/pain

EXAM:
RIGHT WRIST - COMPLETE 3+ VIEW

[wrist ap (1 of 2)]
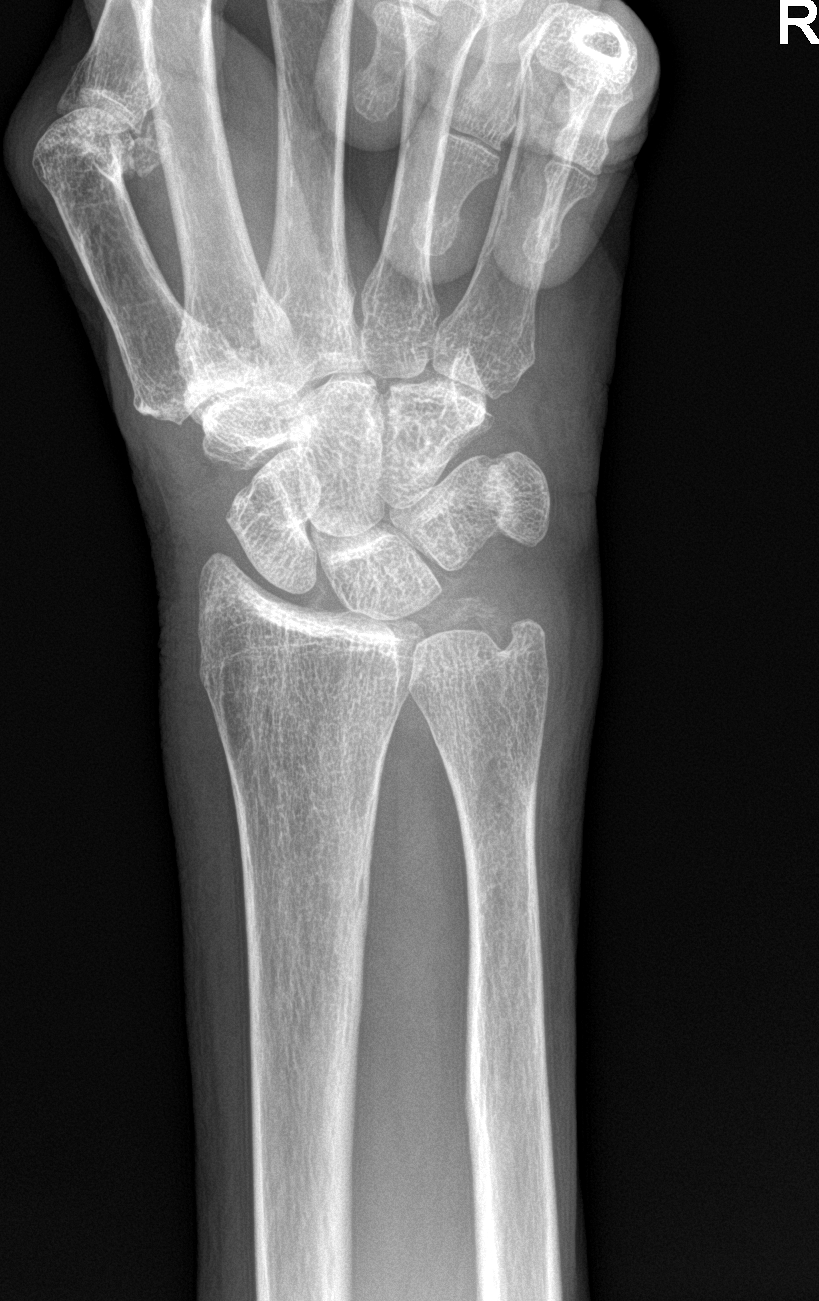

[wrist obl]
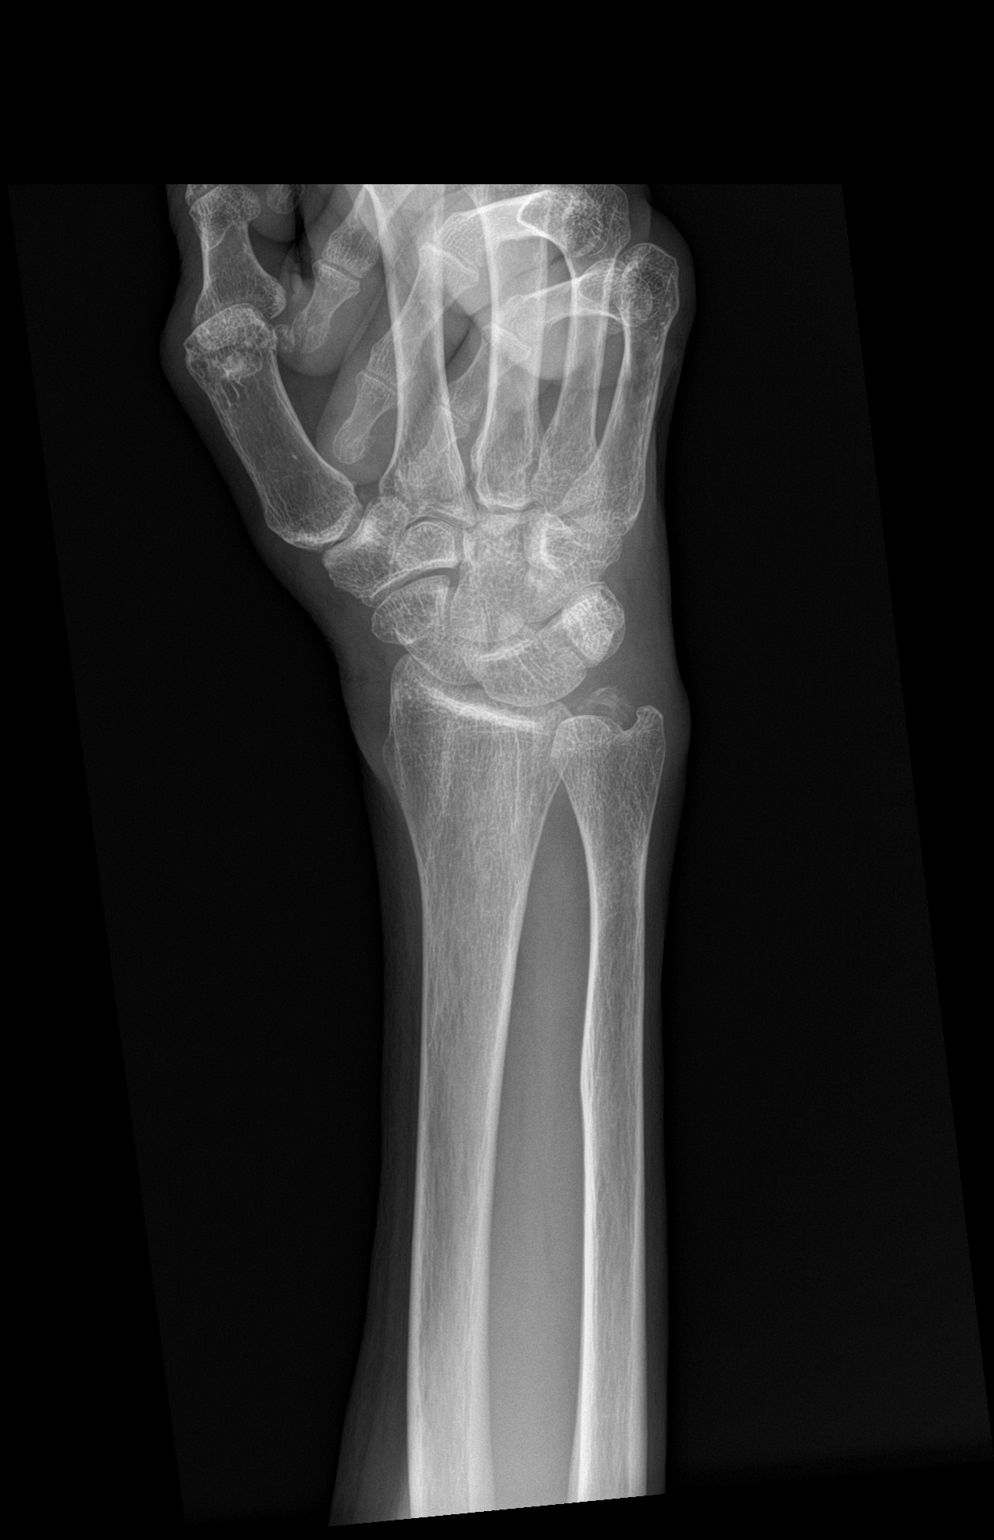

[wrist lat]
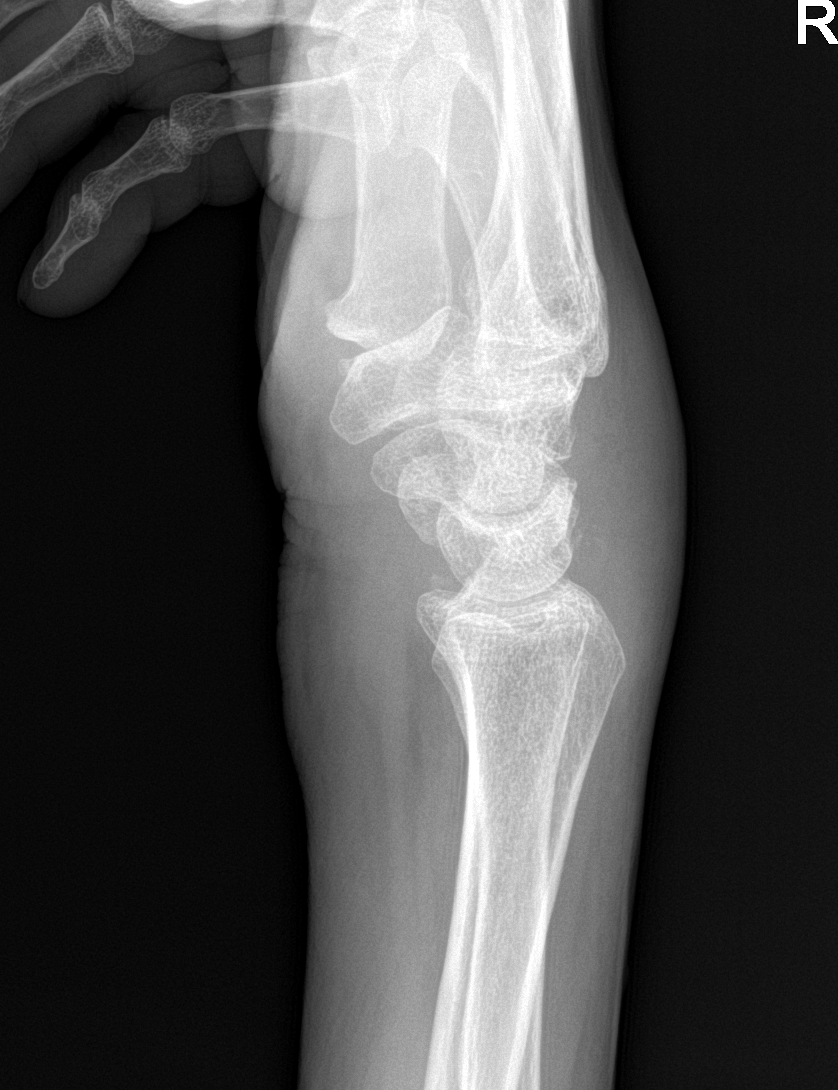

[wrist ap (2 of 2)]
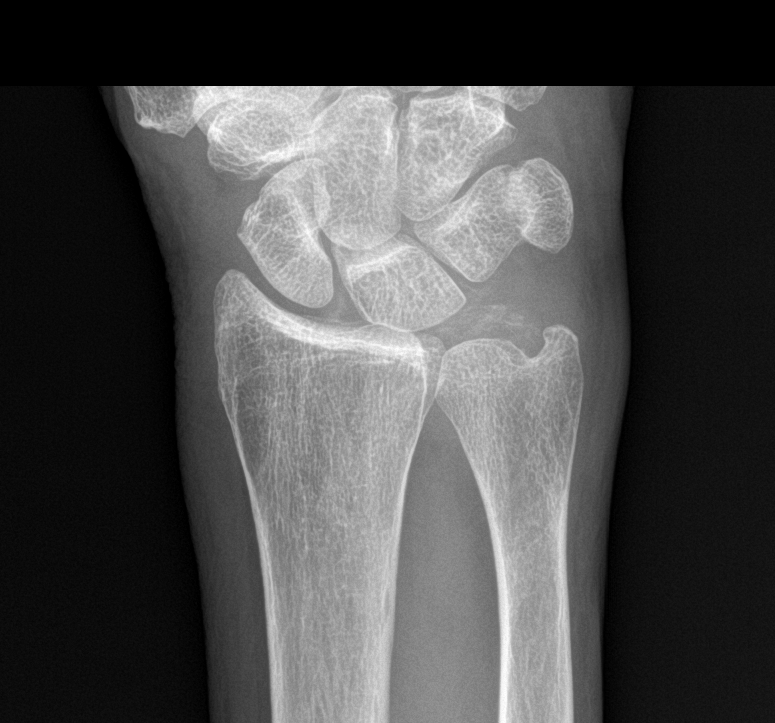

[4 of 4 positions shown; findings below may reference images not displayed]

FINDINGS: Alignment is anatomic. No acute fracture. Joint spaces are
preserved. There is nonspecific calcification of the TFCC.
IMPRESSION: No acute osseous abnormality.

## 2020-12-21 MED ORDER — INSULIN ASPART 100 UNIT/ML IJ SOLN
0.0000 [IU] | Freq: Three times a day (TID) | INTRAMUSCULAR | Status: DC
  Administered 2020-12-21 – 2020-12-22 (×2): 3 [IU] via SUBCUTANEOUS
  Administered 2020-12-22 (×2): 2 [IU] via SUBCUTANEOUS
  Administered 2020-12-22: 3 [IU] via SUBCUTANEOUS
  Filled 2020-12-21 (×6): qty 1

## 2020-12-21 MED ORDER — VANCOMYCIN HCL IN DEXTROSE 1-5 GM/200ML-% IV SOLN: 1000.0000 mg | Freq: Once | INTRAVENOUS | Status: DC

## 2020-12-21 MED ORDER — VANCOMYCIN HCL IN DEXTROSE 1-5 GM/200ML-% IV SOLN
1000.0000 mg | INTRAVENOUS | Status: DC
Start: 2020-12-22 — End: 2020-12-22
  Administered 2020-12-22: 1000 mg via INTRAVENOUS
  Filled 2020-12-21: qty 200

## 2020-12-21 MED ORDER — VITAMIN D (ERGOCALCIFEROL) 1.25 MG (50000 UNIT) PO CAPS
50000.0000 [IU] | ORAL_CAPSULE | ORAL | Status: DC
  Administered 2020-12-23: 50000 [IU] via ORAL
  Filled 2020-12-21: qty 1

## 2020-12-21 MED ORDER — ALBUTEROL SULFATE HFA 108 (90 BASE) MCG/ACT IN AERS: 2.0000 | INHALATION_SPRAY | Freq: Four times a day (QID) | RESPIRATORY_TRACT | Status: DC | PRN

## 2020-12-21 MED ORDER — VANCOMYCIN HCL 1250 MG/250ML IV SOLN
1250.0000 mg | Freq: Once | INTRAVENOUS | Status: AC
  Administered 2020-12-21: 1250 mg via INTRAVENOUS
  Filled 2020-12-21: qty 250

## 2020-12-21 MED ORDER — ACETAMINOPHEN 650 MG RE SUPP: 650.0000 mg | Freq: Four times a day (QID) | RECTAL | Status: DC | PRN

## 2020-12-21 MED ORDER — TRIAMCINOLONE ACETONIDE 0.1 % EX CREA
TOPICAL_CREAM | Freq: Two times a day (BID) | CUTANEOUS | Status: DC
Start: 2020-12-21 — End: 2020-12-25
  Filled 2020-12-21: qty 15

## 2020-12-21 MED ORDER — CEFEPIME HCL 2 G IJ SOLR
2.0000 g | Freq: Once | INTRAMUSCULAR | Status: AC
  Administered 2020-12-21: 2 g via INTRAVENOUS
  Filled 2020-12-21: qty 2

## 2020-12-21 MED ORDER — IPRATROPIUM BROMIDE 0.02 % IN SOLN: 0.5000 mg | Freq: Four times a day (QID) | RESPIRATORY_TRACT | Status: DC | PRN

## 2020-12-21 MED ORDER — OMEGA-3-ACID ETHYL ESTERS 1 G PO CAPS
1.0000 g | ORAL_CAPSULE | Freq: Two times a day (BID) | ORAL | Status: DC
  Administered 2020-12-21 – 2020-12-25 (×8): 1 g via ORAL
  Filled 2020-12-21 (×8): qty 1

## 2020-12-21 MED ORDER — CELECOXIB 100 MG PO CAPS
100.0000 mg | ORAL_CAPSULE | Freq: Two times a day (BID) | ORAL | Status: DC
  Administered 2020-12-21 – 2020-12-25 (×8): 100 mg via ORAL
  Filled 2020-12-21 (×10): qty 1

## 2020-12-21 MED ORDER — MORPHINE SULFATE (PF) 2 MG/ML IV SOLN
2.0000 mg | INTRAVENOUS | Status: DC | PRN
  Administered 2020-12-21 – 2020-12-25 (×9): 2 mg via INTRAVENOUS
  Filled 2020-12-21 (×9): qty 1

## 2020-12-21 MED ORDER — ASPIRIN EC 81 MG PO TBEC
81.0000 mg | DELAYED_RELEASE_TABLET | Freq: Every day | ORAL | Status: DC
  Administered 2020-12-22 – 2020-12-25 (×4): 81 mg via ORAL
  Filled 2020-12-21 (×4): qty 1

## 2020-12-21 MED ORDER — MORPHINE SULFATE (PF) 2 MG/ML IV SOLN
2.0000 mg | Freq: Once | INTRAVENOUS | Status: AC
  Administered 2020-12-21: 2 mg via INTRAVENOUS
  Filled 2020-12-21: qty 1

## 2020-12-21 MED ORDER — ALBUTEROL SULFATE (2.5 MG/3ML) 0.083% IN NEBU
2.5000 mg | INHALATION_SOLUTION | Freq: Four times a day (QID) | RESPIRATORY_TRACT | Status: DC | PRN
  Administered 2020-12-22 – 2020-12-23 (×2): 2.5 mg via RESPIRATORY_TRACT
  Filled 2020-12-21 (×2): qty 3

## 2020-12-21 MED ORDER — ROPINIROLE HCL 1 MG PO TABS
1.5000 mg | ORAL_TABLET | Freq: Every day | ORAL | Status: DC
  Administered 2020-12-21 – 2020-12-24 (×4): 1.5 mg via ORAL
  Filled 2020-12-21 (×5): qty 2

## 2020-12-21 MED ORDER — TRAZODONE HCL 50 MG PO TABS: 25.0000 mg | ORAL_TABLET | Freq: Every evening | ORAL | Status: DC | PRN

## 2020-12-21 MED ORDER — MAGNESIUM HYDROXIDE 400 MG/5ML PO SUSP
30.0000 mL | Freq: Every day | ORAL | Status: DC | PRN
  Administered 2020-12-24 – 2020-12-25 (×2): 30 mL via ORAL
  Filled 2020-12-21 (×2): qty 30

## 2020-12-21 MED ORDER — HYDROXYZINE HCL 10 MG PO TABS
10.0000 mg | ORAL_TABLET | Freq: Three times a day (TID) | ORAL | Status: DC | PRN
  Filled 2020-12-21: qty 1

## 2020-12-21 MED ORDER — CLONAZEPAM 0.5 MG PO TABS
0.5000 mg | ORAL_TABLET | Freq: Two times a day (BID) | ORAL | Status: DC | PRN
  Administered 2020-12-24 (×2): 0.5 mg via ORAL
  Filled 2020-12-21 (×2): qty 1

## 2020-12-21 MED ORDER — SODIUM CHLORIDE 0.9 % IV SOLN
2.0000 g | Freq: Two times a day (BID) | INTRAVENOUS | Status: DC
  Administered 2020-12-22: 2 g via INTRAVENOUS
  Filled 2020-12-21 (×2): qty 2

## 2020-12-21 MED ORDER — CYCLOBENZAPRINE HCL 10 MG PO TABS
10.0000 mg | ORAL_TABLET | Freq: Three times a day (TID) | ORAL | Status: DC | PRN
  Administered 2020-12-21 – 2020-12-25 (×2): 10 mg via ORAL
  Filled 2020-12-21 (×2): qty 1

## 2020-12-21 MED ORDER — SODIUM CHLORIDE 0.9 % IV SOLN: INTRAVENOUS | Status: DC

## 2020-12-21 MED ORDER — ENOXAPARIN SODIUM 40 MG/0.4ML IJ SOSY
40.0000 mg | PREFILLED_SYRINGE | INTRAMUSCULAR | Status: DC
  Administered 2020-12-21 – 2020-12-24 (×4): 40 mg via SUBCUTANEOUS
  Filled 2020-12-21 (×4): qty 0.4

## 2020-12-21 MED ORDER — GABAPENTIN 300 MG PO CAPS
300.0000 mg | ORAL_CAPSULE | Freq: Four times a day (QID) | ORAL | Status: DC
  Administered 2020-12-21 – 2020-12-25 (×13): 300 mg via ORAL
  Filled 2020-12-21 (×14): qty 1

## 2020-12-21 MED ORDER — SODIUM CHLORIDE 0.9 % IV SOLN: 2.0000 g | Freq: Once | INTRAVENOUS | Status: DC

## 2020-12-21 MED ORDER — ACETAMINOPHEN 325 MG PO TABS
650.0000 mg | ORAL_TABLET | Freq: Four times a day (QID) | ORAL | Status: DC | PRN
  Filled 2020-12-21: qty 2

## 2020-12-21 MED ORDER — GADOBUTROL 1 MMOL/ML IV SOLN
5.0000 mL | Freq: Once | INTRAVENOUS | Status: DC | PRN
  Filled 2020-12-21: qty 6

## 2020-12-21 MED ORDER — ADULT MULTIVITAMIN W/MINERALS CH
ORAL_TABLET | Freq: Two times a day (BID) | ORAL | Status: DC
  Administered 2020-12-21 – 2020-12-24 (×7): 1 via ORAL
  Filled 2020-12-21 (×7): qty 1

## 2020-12-21 MED ORDER — DIPHENHYDRAMINE HCL 25 MG PO TABS
50.0000 mg | ORAL_TABLET | ORAL | Status: DC | PRN
  Filled 2020-12-21: qty 2

## 2020-12-21 NOTE — ED Notes (Signed)
This RN called & spoke with receiving LPN, who states that her charge RN looked at this pts chart & it is okay to send pt up to the room.

## 2020-12-21 NOTE — ED Triage Notes (Signed)
Pt in via EMS form home with c/o right arm pain. Family members reports pt is not acting right. Pt is alert and oriented x's 4, pt did no want to come to the ED. Pt has hx of pain to her right arm but reports when she came no one did an x-ray.

## 2020-12-21 NOTE — Consult Note (Signed)
PHARMACY -  BRIEF ANTIBIOTIC NOTE   Pharmacy has received consult(s) for vancomycin and cefepime from an ED provider.  The patient's profile has been reviewed for ht/wt/allergies/indication/available labs.    One time order(s) placed for cefepime 2 g and vancomycin 1250 mg   Further antibiotics/pharmacy consults should be ordered by admitting physician if indicated.                       Thank you, Derrek Gu, PharmD 12/21/2020  3:41 PM

## 2020-12-21 NOTE — ED Notes (Signed)
Secure msg sent to Kaliste Lowe, LPN for ED to IP SBAR 

## 2020-12-21 NOTE — ED Notes (Addendum)
Pt transported back to pt room from MRI via stretcher with MRI tech. Per MRI tech, pt did not finish test. Secure msg sent to Dr. Arville Care to update.

## 2020-12-21 NOTE — ED Notes (Signed)
Pt to MRI. Labs to obtain once pt returns.

## 2020-12-21 NOTE — ED Notes (Signed)
Pt sleeping, resting comfortably in bed, NAD, chest rise & fall. No needs identified at this time. Bed low & locked; call light & personal items within reach.  Dinner tray brought to pt.

## 2020-12-21 NOTE — ED Notes (Signed)
Pt back from MRI due to machines not working. MD made aware

## 2020-12-21 NOTE — Consult Note (Signed)
Pharmacy Antibiotic Note  Suzanne Snyder is a 70 y.o. female admitted on 12/21/2020 with cellulitis.  Pharmacy has been consulted for vancomycin and cefepime dosing.  Plan: Vancomycin 1250 mg IV loading dose, followed by 1000 mg IV q24h  Goal AUC 400-550  Est AUC: 500.8 Est Cmax: 36.3 Est Cmin: 11.1 Calculated with SCr 0.8 and TBW to calculate CrCl & Ke  Cefepime 2 g IV q12h   Monitor clinical picture, renal function, vancomycin levels at steady state F/U C&S, abx deescalation / LOT   Height: 5\' 9"  (175.3 cm) Weight: 54 kg (119 lb) IBW/kg (Calculated) : 66.2  Temp (24hrs), Avg:99.1 F (37.3 C), Min:99.1 F (37.3 C), Max:99.1 F (37.3 C)  Recent Labs  Lab 12/21/20 0850 12/21/20 1118  WBC 10.5  --   CREATININE 0.67  --   LATICACIDVEN  --  1.2    Estimated Creatinine Clearance: 56.6 mL/min (by C-G formula based on SCr of 0.67 mg/dL).    Allergies  Allergen Reactions   Pregabalin Anaphylaxis   Erythromycin Other (See Comments)    Severe stomach upset    Antimicrobials this admission: 12/10 vancomycin >>  12/10 cefepime >>   Dose adjustments this admission: N/A  Microbiology results: 12/10 BCx: pending   Thank you for allowing pharmacy to be a part of this patient's care.  14/10, PharmD 12/21/2020 7:25 PM

## 2020-12-21 NOTE — ED Notes (Signed)
Patient transported to MRI 

## 2020-12-21 NOTE — ED Notes (Signed)
Pt back from MRI, MRI states pt could not tolerate imaging.

## 2020-12-21 NOTE — ED Provider Notes (Signed)
[facility] Emergency Department Provider Note ____________________________________________   Event Date/Time   First MD Initiated Contact with Patient 12/21/20 [phone]     (approximate)  I have reviewed the triage vital signs and the nursing notes.  HISTORY  Chief Complaint Arm Pain and Dehydration  HPI Suzanne Snyder is a 70 y.o. female was recently admitted to the hospital for a infection in her thigh.  She reports over the last 1 to 2 days she started experiencing a lot of pain in her right forearm, specifically around her right wrist.  Denies any injury.  She is a diabetic.  She reports a severe lancinating pain worsened with motion and some swelling about her right wrist  Again no injury.   Past Medical History:  Diagnosis Date   Anxiety    Arthritis    Asthma    Diabetes mellitus without complication (HCC)    Insulin pump in place    Osteoporosis    RLS (restless legs syndrome)    Smokers' cough (HCC)    Wears dentures    full upper and lower    Patient Active Problem List   Diagnosis Date Noted   Cellulitis of right wrist 12/21/2020   Cellulitis of right thigh 12/11/2020   Type 1 diabetes mellitus with hypoglycemia without coma (HCC) 12/11/2020   Hypoglycemia 12/10/2020    Past Surgical History:  Procedure Laterality Date   CATARACT EXTRACTION W/PHACO Right 02/20/2020   Procedure: CATARACT EXTRACTION PHACO AND INTRAOCULAR LENS PLACEMENT (IOC) RIGHT 5.60 00:36.3;  Surgeon: Galen Manila, MD;  Location: [facility];  Service: Ophthalmology;  Laterality: Right;  Diabetic - insulin pump Requests not first   CATARACT EXTRACTION W/PHACO Left 03/05/2020   Procedure: CATARACT EXTRACTION PHACO AND INTRAOCULAR LENS PLACEMENT (IOC) LEFT 4.94 00:31.6;  Surgeon: Galen Manila, MD;  Location: [facility];  Service: Ophthalmology;  Laterality: Left;   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 Authorizing Provider  acetaminophen (TYLENOL) 325 MG tablet Take 650 mg by mouth every 6 (six) hours as needed.    [provider]  albuterol (VENTOLIN HFA) 108 (90 Base) MCG/ACT inhaler Inhale into the lungs every 6 (six) hours as needed for wheezing or shortness of breath.    [provider]  ammonium lactate (LAC-HYDRIN) 12 % cream Apply topically as needed for dry skin. Apply to affected areas of hands and feet 03/19/20   Willeen Niece, MD  aspirin 81 MG EC tablet Take 81 mg by mouth daily. Swallow whole.    [provider]  celecoxib (CELEBREX) 100 MG capsule Take 100 mg by mouth 2 (two) times daily.    [provider]  ciclopirox (LOPROX) 0.77 % cream APPLY A SMALL AMOUNT TO AFFECTED AREA TWICE A DAY FOR FUNGAL INFECTION OF THE FEET. APPLY BETWEEN TOES AND ON TOENAILS 03/19/20   Willeen Niece, MD  clonazePAM (KLONOPIN) 0.5 MG tablet Take 0.5 mg by mouth 2 (two) times daily as needed for anxiety.    [provider]  cyclobenzaprine (FLEXERIL) 10 MG tablet Take 10 mg by mouth 3 (three) times daily as needed for muscle spasms.    [provider]  diphenhydrAMINE HCl (BENADRYL ALLERGY PO) Take 50 mg by  mouth as needed.    [provider]  gabapentin (NEURONTIN) 300 MG capsule Take 300 mg by mouth 4 (four) times daily.    [provider]  hydrOXYzine (ATARAX/VISTARIL) 10 MG tablet TAKE 1 OR 2 TABLET BY MOUTH THREE TIMES DAILY AS NEEDED FOR [facility] 03/19/20   Willeen Niece, MD  insulin aspart (NOVOLOG) 100 UNIT/ML injection Inject into the skin 4 (four) times daily as needed for high blood sugar (with meals and at bedtime).    [provider]  ipratropium (ATROVENT) 0.02 % nebulizer solution Take 0.5 mg by nebulization every 6 (six) hours as needed for wheezing or  shortness of breath.    [provider]  lidocaine (LIDODERM) 5 % Place 1 patch onto the skin daily as needed. Remove & Discard patch within 12 hours or as directed by MD Patient not taking: Reported on 12/17/2020    [provider]  montelukast (SINGULAIR) 10 MG tablet Take 10 mg by mouth at bedtime.    [provider]  Multiple Vitamin (MULTIVITAMIN ADULT PO) Take by mouth 2 (two) times daily.    [provider]  mupirocin ointment (BACTROBAN) 2 % Apply 1 application topically 2 (two) times daily. Apply to affected area of forehead Patient not taking: Reported on 12/17/2020 05/21/20   Willeen Niece, MD  Omega-3 Fatty Acids (FISH OIL) 1000 MG CAPS Take 1.5 capsules by mouth 2 (two) times daily. 08/04/19   [provider]  rOPINIRole (REQUIP) 1 MG tablet Take 1.5 mg by mouth at bedtime.    [provider]  tapentadol (NUCYNTA) 50 MG TABS tablet Take 50 mg by mouth. Patient not taking: Reported on 12/17/2020    [provider]  traMADol (ULTRAM) 50 MG tablet Take 1 tablet by mouth every 8 (eight) hours as needed. Patient not taking: Reported on 12/17/2020 10/15/20   [provider]  triamcinolone (KENALOG) 0.1 % Apply 1 application topically 2 (two) times daily as needed (Rash). To affected areas of hands and back until rash cleared 03/19/20   Willeen Niece, MD  triamcinolone cream (KENALOG) 0.1 % Apply a small amount to affected area 1 to 2 times daily as needed for itchy areas on back, Avoid face, groin, and underarms. NEEDS APPT FOR FURTHER REFILLS 08/30/19   Willeen Niece, MD  Vitamin D, Ergocalciferol, (DRISDOL) 50000 UNITS CAPS capsule Take 50,000 Units by mouth 2 (two) times a week.    [provider]    Allergies Pregabalin and Erythromycin  Family History  Problem Relation Age of Onset   Parkinsonism Father     Social History Social History   Tobacco Use   Smoking status: Every Day    Packs/day: 1.00     Years: 50.00    Pack years: 50.00    Types: Cigarettes   Smokeless tobacco: Never   Tobacco comments:    started smoking age 37  Vaping Use   Vaping Use: Former   Devices: "tried it"  Substance Use Topics   Alcohol use: No   Drug use: No    Review of Systems Constitutional: No fever/chills but feeling fatigued. Eyes: No visual changes. ENT: No sore throat. Cardiovascular: Denies chest pain. Respiratory: Denies shortness of breath. Gastrointestinal: No abdominal pain.   Genitourinary: Negative for dysuria. Musculoskeletal: Negative for back pain.  Pain right forearm right wrist Midge Minium needs a sharp discomfort when she moves up towards her right shoulder.  No numbness tingling or weakness but moving the right arm especially any  movement of the right wrist is very painful. Skin: Negative for rash.  Wrist feels little swollen Neurological: Negative for headaches, areas of focal weakness or numbness.    ____________________________________________   PHYSICAL EXAM:  VITAL SIGNS: ED Triage Vitals  Enc Vitals Group     BP 12/21/20 0840 131/71     Pulse Rate 12/21/20 0840 69     Resp 12/21/20 0840 16     Temp 12/21/20 0840 99.1 F (37.3 C)     Temp Source 12/21/20 0840 Oral     SpO2 12/21/20 0840 96 %     Weight 12/21/20 0841 119 lb (54 kg)     Height 12/21/20 0841  (1.753 m)     Head Circumference --      Peak Flow --      Pain Score 12/21/20 0840 8     Pain Loc --      Pain Edu? --      Excl. in GC? --     Constitutional: Alert and oriented.  Mildly ill-appearing, in no acute distress.  She demonstrates splinting of her right upper extremity but in particular holding her right wrist so as not to move it. Eyes: Conjunctivae are normal. Head: Atraumatic. Nose: No congestion/rhinnorhea. Mouth/Throat: Mucous membranes are moist. Neck: No stridor.  Cardiovascular: Normal rate, regular rhythm. Grossly normal heart sounds.  Good peripheral circulation. Respiratory:  Normal respiratory effort.  No retractions. Lungs CTAB. Gastrointestinal: Soft and nontender. No distention. Musculoskeletal: No lower extremity tenderness nor edema.  Strong right hand perfusion with strong capillary refill and palpable radial pulse.  All fingers of the right hand appear normal.  Good range of motion of the fingers right hand reports normal sensation.  She has mild edema and slight erythema overlying the right wrist particularly over the dorsal aspect.  No obvious large effusion by clinical exam.  Any attempt to range the right wrist through range of motion causes pain which she reports to be severe.  No tracking redness or erythema up into the forearm or to the hand, but there is warmth slight redness pain and swelling overlying the right dorsal wrist in particular Neurologic:  Normal speech and language. No gross focal neurologic deficits are appreciated.  Skin:  Skin is warm, dry and intact. No rash noted. Psychiatric: Mood and affect are normal. Speech and behavior are normal.  ____________________________________________   LABS (all labs ordered are listed, but only abnormal results are displayed)  Labs Reviewed  COMPREHENSIVE METABOLIC PANEL - Abnormal; Notable for the following components:      Result Value   Glucose, Bld 175 (*)    Calcium 8.6 (*)    All other components within normal limits  URINALYSIS, COMPLETE (UACMP) WITH MICROSCOPIC - Abnormal; Notable for the following components:   Color, Urine YELLOW (*)    APPearance HAZY (*)    Ketones, ur 20 (*)    Protein, ur 30 (*)    Leukocytes,Ua TRACE (*)    Bacteria, UA RARE (*)    All other components within normal limits  SEDIMENTATION RATE - Abnormal; Notable for the following components:   Sed Rate 42 (*)    All other components within normal limits  CULTURE, BLOOD (ROUTINE X 2)  CULTURE, BLOOD (ROUTINE X 2)  RESP PANEL BY RT-PCR (FLU A&B, COVID) ARPGX2  CBC WITH DIFFERENTIAL/PLATELET  LACTIC ACID,  PLASMA   ____________________________________________  EKG   ____________________________________________  RADIOLOGY  DG Wrist Complete Right  Result Date: 12/21/2020  CLINICAL DATA:  Swelling/pain EXAM: RIGHT WRIST - COMPLETE 3+ VIEW COMPARISON:  None. FINDINGS: Alignment is anatomic. No acute fracture. Joint spaces are preserved. There is nonspecific calcification of the TFCC. IMPRESSION: No acute osseous abnormality. Electronically Signed   By: Guadlupe Spanish M.D.   On: 12/21/2020 09:55     Imaging right wrist negative for acute osseous finding. ____________________________________________   PROCEDURES  Procedure(s) performed: None  Procedures  Critical Care performed: No  ____________________________________________   INITIAL IMPRESSION / ASSESSMENT AND PLAN / ED COURSE  Pertinent labs & imaging results that were available during my care of the patient were reviewed by me and considered in my medical decision making (see chart for details).   Patient reports right arm pain particularly centered at the right wrist with there is some swelling and erythema and warmth.  Possibly an underlying joint effusion and question if it is possible she could be seeding with infectious etiology as has known MRSA but was treated with dalbavancin and has been following with infectious disease with improvement of her right thigh wound.  However this could be representative of acute joint arthropathy, cellulitis, septic joint, etc.  Given the extent of swelling and location and redness I do not feel that I would reasonably be able to provide arthrocentesis personally, but discussed case with Dr. Earlene Plater.  Obtain MRI of the right wrist, and consider consultation with orthopedics based upon review of the MRI imaging  Clinical Course as of 12/21/20 1539  Sat Dec 21, 2020  1128 MRI advises issue with the MRI machine.  They are working to and hope to fix shortly.  Patient continues await MRI [MQ]   1130 Paged Dr. Earlene Plater to discuss. (Covering ID MD) [MQ]  1303 MRI advises, machine backup and working now.  Anticipate MRI to occur around 1:30 PM [MQ]  1329 Patient awaiting MRI, currently utilizing the toilet.  Would like additional medication for right wrist pain.  Morphine ordered [MQ]  1454 Patient could not tolerate MRI due to pain.  Reassessed she is resting, but reports ongoing pain in the right wrist.  There is still edematous erythematous warm to touch and though this could represent an overlying cellulitis my concern given her recent MRSA is that of possible an underlying septic joint.  Discussed with Dr. Allena Katz of orthopedics, he recommends at this juncture reasonable to obtain a possible joint aspiration via interventional radiology, nor if not possible reasonable to admit to hospitalist initiate IV antibiotic and obtain Ortho consult.  Have paged interventional radiology to see if service available for possible joint arthrocentesis at this time.  Patient is understanding this plan and possible need for admission.  Additional morphine ordered [MQ]    Clinical Course User Index [MQ] Sharyn Creamer, MD    ----------------------------------------- 3:37 PM on 12/21/2020 ----------------------------------------- Discussed with interventional radiology, they advised that they do not perform wrist arthrocentesis (Dr. Milford Cage).  Given this, I have already placed consultation with orthopedics, and Dr. Allena Katz aware of this.  Plan now to initiate IV antibiotic for suspicion of but or at least risk of septic right wrist joint possibly overlying cellulitis.  Will admit for further care, expectation of orthopedic consult which has been discussed with Dr. Allena Katz.  Dr. Arville Care to provide admission.  Will reattempt MRI now as well  ____________________________________________   FINAL CLINICAL IMPRESSION(S) / ED DIAGNOSES  Final diagnoses:  Acute pain of right wrist  Cellulitis of right wrist  History of  MRSA infection  Note:  This document was prepared using Conservation officer, historic buildings and may include unintentional dictation errors       Sharyn Creamer, MD 12/21/20 1539

## 2020-12-21 NOTE — ED Triage Notes (Signed)
Husband Fayrene Fearing, [phone]. Pt husband has been sitting in the WR, wife refuses to see pt, pt informed and left his number for contact purposes.

## 2020-12-21 NOTE — Progress Notes (Addendum)
Pt admitted to room 158 from ED. Belonging at bedside includes only top dentures, PJ pant, underwear, yellow wrist bracelet and Omnipod insulin pump device that was removed by patient due to beeping and patient did not bring the other supply needed to change and refill the insulin pump.   Skin assessment completed with off going LPN Bretta Bang. Pt has old wound on right upper inner thigh that is packed with strip and covered with foam dressing. Blanchable erythema at bilateral heels and sacrum. Both heels and sacrum covered with foam dressing for protection. Pt also have redness and swelling at the right wrist area that is tender to touch.   2340 provider made aware of this omnipod insulin pump, awaiting evaluation of the chart and further recommendation. SSI administered at bedtime per order.

## 2020-12-21 NOTE — H&P (Signed)
[place]   PATIENT NAME: Suzanne Snyder    MR#:  [id number]  DATE OF BIRTH:  May 30, 1950  DATE OF ADMISSION:  12/21/2020  PRIMARY CARE PHYSICIAN: Konrad Saha, MD   Patient is coming from: Home  REQUESTING/REFERRING PHYSICIAN: Delman Kitten, MD  CHIEF COMPLAINT:   Chief Complaint  Patient presents with   Arm Pain   Dehydration    HISTORY OF PRESENT ILLNESS:  Suzanne Snyder is a 70 y.o. female with medical history significant for asthma, arthritis, anxiety, type 2 diabetes mellitus, osteoporosis and restless leg syndrome coming with acute onset of right forearm and wrist pain with swelling.  She reports no falls or injuries.  She denies any fever or chills.  No nausea or vomiting or abdominal pain.  She was recently admitted for right thigh purulent cellulitis and had I&D and was discharged on 11/30.  No chest pain or dyspnea or cough or wheezing.  No dysuria, oliguria or hematuria or flank pain. ED Course: When she came to the ER blood pressure was 131/71 and later 146/62 with otherwise unremarkable vital signs then respiratory rate was 21.  Labs revealed a blood glucose of 175 and calcium of 8.6 with otherwise unremarkable CMP.  CBC was within normal.  Sed rate was 42 and lactic acid 1.2.  Blood cultures were drawn.  Imaging: Right wrist x-ray showed no acute osseous abnormalities.  MRI of the right wrist was ordered for concern about septic arthritis.  It showed the following: 1. Motion degraded, incomplete exam. 2. Tenosynovitis involving the flexor tendons extending into the visualized aspect of the distal forearm. Trace tenosynovitis involving the third extensor compartment. Findings are nonspecific and could reflect infectious or inflammatory tenosynovitis. 3. Small radiocarpal and distal radioulnar joint effusions, nonspecific. Septic arthritis not excluded. 4. Suspected tear of the articular disc of the TFCC. 5. No acute osseous findings.  The patient was  given IV vancomycin.  Dr. Posey Pronto was notified about the patient and recommended MRI.  She will be admitted to a medical bed for further evaluation and management. PAST MEDICAL HISTORY:   Past Medical History:  Diagnosis Date   Anxiety    Arthritis    Asthma    Diabetes mellitus without complication (HCC)    Insulin pump in place    Osteoporosis    RLS (restless legs syndrome)    Smokers' cough ([place])    Wears dentures    full upper and lower    PAST SURGICAL HISTORY:   Past Surgical History:  Procedure Laterality Date   CATARACT EXTRACTION W/PHACO Right 02/20/2020   Procedure: CATARACT EXTRACTION PHACO AND INTRAOCULAR LENS PLACEMENT ([place]) RIGHT 5.60 00:36.3;  Surgeon: Birder Robson, MD;  Location: [facility];  Service: Ophthalmology;  Laterality: Right;  Diabetic - insulin pump Requests not first   CATARACT EXTRACTION W/PHACO Left 03/05/2020   Procedure: CATARACT EXTRACTION PHACO AND INTRAOCULAR LENS PLACEMENT ([facility]) LEFT 4.94 00:31.6;  Surgeon: Birder Robson, MD;  Location: [facility];  Service: Ophthalmology;  Laterality: Left;   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   ROTATOR CUFF REPAIR Left 2014 and 2015   SPINE SURGERY N/A 1984   L4-5, not sure of procedure   THYROID SURGERY      SOCIAL HISTORY:   Social History   Tobacco Use   Smoking status: Every Day    Packs/day: 1.00    Years: 50.00    Pack years: 50.00    Types: Cigarettes  Smokeless tobacco: Never   Tobacco comments:    started smoking age 47  Substance Use Topics   Alcohol use: No    FAMILY HISTORY:   Family History  Problem Relation Age of Onset   Parkinsonism Father     DRUG ALLERGIES:   Allergies  Allergen Reactions   Pregabalin Anaphylaxis   Erythromycin Other (See Comments)    Severe stomach upset    REVIEW OF SYSTEMS:   ROS As per history of present illness. All pertinent systems were reviewed above. Constitutional, HEENT, cardiovascular, respiratory, GI,  GU, musculoskeletal, neuro, psychiatric, endocrine, integumentary and hematologic systems were reviewed and are otherwise negative/unremarkable except for positive findings mentioned above in the HPI.   MEDICATIONS AT HOME:   Prior to Admission medications   Medication Sig Start Date End Date Taking? Authorizing Provider  acetaminophen (TYLENOL) 325 MG tablet Take 650 mg by mouth every 6 (six) hours as needed.    [provider]  albuterol (VENTOLIN HFA) 108 (90 Base) MCG/ACT inhaler Inhale into the lungs every 6 (six) hours as needed for wheezing or shortness of breath.    [provider]  ammonium lactate (LAC-HYDRIN) 12 % cream Apply topically as needed for dry skin. Apply to affected areas of hands and feet 03/19/20   Willeen Niece, MD  aspirin 81 MG EC tablet Take 81 mg by mouth daily. Swallow whole.    [provider]  celecoxib (CELEBREX) 100 MG capsule Take 100 mg by mouth 2 (two) times daily.    [provider]  ciclopirox (LOPROX) 0.77 % cream APPLY A SMALL AMOUNT TO AFFECTED AREA TWICE A DAY FOR FUNGAL INFECTION OF THE FEET. APPLY BETWEEN TOES AND ON TOENAILS 03/19/20   Willeen Niece, MD  clonazePAM (KLONOPIN) 0.5 MG tablet Take 0.5 mg by mouth 2 (two) times daily as needed for anxiety.    [provider]  cyclobenzaprine (FLEXERIL) 10 MG tablet Take 10 mg by mouth 3 (three) times daily as needed for muscle spasms.    [provider]  diphenhydrAMINE HCl (BENADRYL ALLERGY PO) Take 50 mg by mouth as needed.    [provider]  gabapentin (NEURONTIN) 300 MG capsule Take 300 mg by mouth 4 (four) times daily.    [provider]  hydrOXYzine (ATARAX/VISTARIL) 10 MG tablet TAKE 1 OR 2 TABLET BY MOUTH THREE TIMES DAILY AS NEEDED FOR [facility] 03/19/20   Willeen Niece, MD  insulin aspart (NOVOLOG) 100 UNIT/ML injection Inject into the skin 4 (four) times daily as needed for high blood sugar (with meals and at bedtime).    [provider]  ipratropium (ATROVENT) 0.02 % nebulizer solution Take 0.5 mg by nebulization every 6 (six) hours as needed for wheezing or shortness of breath.    [provider]  lidocaine (LIDODERM) 5 % Place 1 patch onto the skin daily as needed. Remove & Discard patch within 12 hours or as directed by MD Patient not taking: Reported on 12/17/2020    [provider]  montelukast (SINGULAIR) 10 MG tablet Take 10 mg by mouth at bedtime.    [provider]  Multiple Vitamin (MULTIVITAMIN ADULT PO) Take by mouth 2 (two) times daily.    [provider]  mupirocin ointment (BACTROBAN) 2 % Apply 1 application topically 2 (two) times daily. Apply to affected area of forehead Patient not taking: Reported on 12/17/2020 05/21/20   Willeen Niece, MD  Omega-3 Fatty Acids (FISH OIL) 1000 MG CAPS Take 1.5 capsules  by mouth 2 (two) times daily. 08/04/19   [provider]  rOPINIRole (REQUIP) 1 MG tablet Take 1.5 mg by mouth at bedtime.    [provider]  tapentadol (NUCYNTA) 50 MG TABS tablet Take 50 mg by mouth. Patient not taking: Reported on 12/17/2020    [provider]  traMADol (ULTRAM) 50 MG tablet Take 1 tablet by mouth every 8 (eight) hours as needed. Patient not taking: Reported on 12/17/2020 10/15/20   [provider]  triamcinolone (KENALOG) 0.1 % Apply 1 application topically 2 (two) times daily as needed (Rash). To affected areas of hands and back until rash cleared 03/19/20   Brendolyn Patty, MD  triamcinolone cream (KENALOG) 0.1 % Apply a small amount to affected area 1 to 2 times daily as needed for itchy areas on back, Avoid face, groin, and underarms. NEEDS APPT FOR FURTHER REFILLS 08/30/19   Brendolyn Patty, MD  Vitamin D, Ergocalciferol, (DRISDOL) 50000 UNITS CAPS capsule Take 50,000 Units by mouth 2 (two) times a week.    [provider]      VITAL SIGNS:  Blood pressure (!) 139/93, pulse 83, temperature 99.1 F (37.3  C), temperature source Oral, resp. rate 18, height 5\' 9"  (1.753 m), weight 54 kg, SpO2 94 %.  PHYSICAL EXAMINATION:  Physical Exam  GENERAL:  70 y.o.-year-old female patient lying in the bed with no acute distress.  EYES: Pupils equal, round, reactive to light and accommodation. No scleral icterus. Extraocular muscles intact.  HEENT: Head atraumatic, normocephalic. Oropharynx and nasopharynx clear.  NECK:  Supple, no jugular venous distention. No thyroid enlargement, no tenderness.  LUNGS: Normal breath sounds bilaterally, no wheezing, rales,rhonchi or crepitation. No use of accessory muscles of respiration.  CARDIOVASCULAR: Regular rate and rhythm, S1, S2 normal. No murmurs, rubs, or gallops.  ABDOMEN: Soft, nondistended, nontender. Bowel sounds present. No organomegaly or mass.  EXTREMITIES: No pedal edema, cyanosis, or clubbing.  NEUROLOGIC: Cranial nerves II through XII are intact. Muscle strength 5/5 in all extremities. Sensation intact. Gait not checked.  PSYCHIATRIC: The patient is alert and oriented x 3.  Normal affect and good eye contact. SKIN/musculoskeletal: Right distal forearm and the wrist swelling extending to the dorsum of the right hand with erythema, warmth, tenderness and induration with no fluctuance.   LABORATORY PANEL:   CBC Recent Labs  Lab 12/21/20 0850  WBC 10.5  HGB 13.7  HCT 41.8  PLT 271   ------------------------------------------------------------------------------------------------------------------  Chemistries  Recent Labs  Lab 12/21/20 0850  NA 136  K 3.9  CL 104  CO2 23  GLUCOSE 175*  BUN 11  CREATININE 0.67  CALCIUM 8.6*  AST 16  ALT 15  ALKPHOS 65  BILITOT 0.9   ------------------------------------------------------------------------------------------------------------------  Cardiac Enzymes No results for input(s): TROPONINI in the last 168  hours. ------------------------------------------------------------------------------------------------------------------  RADIOLOGY:  DG Wrist Complete Right  Result Date: 12/21/2020 CLINICAL DATA:  Swelling/pain EXAM: RIGHT WRIST - COMPLETE 3+ VIEW COMPARISON:  None. FINDINGS: Alignment is anatomic. No acute fracture. Joint spaces are preserved. There is nonspecific calcification of the TFCC. IMPRESSION: No acute osseous abnormality. Electronically Signed   By: Macy Mis M.D.   On: 12/21/2020 09:55      IMPRESSION AND PLAN:  Principal Problem:   Cellulitis of right wrist  1.  Cellulitis of the right wrist and distal forearm wound extending to the dorsum of the hand with inability to exclude septic arthritis. - The patient will be admitted to a medical bed. - We will  continue antibiotic therapy with IV vancomycin and cefepime. - Pain management to be provided. - We will check uric acid level and if elevated may consider IV steroids for the possibility of gouty arthritis in the differential diagnosis.  2.  Type 2 diabetes mellitus with peripheral neuropathy. - The patient will be placed on supplement coverage with NovoLog.  3.  Asthma, controlled. - We will continue Singulair.  4.  Restless leg syndrome. - We will continue Requip.  5.  Peripheral neuropathy. - We will continue p.o. Neurontin.  6.  Anxiety. - We will continue Klonopin.   DVT prophylaxis: Lovenox.  Code Status: full code.  Family Communication:  The plan of care was discussed in details with the patient (and family). I answered all questions. The patient agreed to proceed with the above mentioned plan. Further management will depend upon hospital course. Disposition Plan: Back to previous home environment Consults called: Hepatic consult.  Ordered All the records are reviewed and case discussed with ED provider.  Status is: Inpatient   Remains inpatient appropriate because:Ongoing diagnostic testing  needed not appropriate for outpatient work up, Unsafe d/c plan, IV treatments appropriate due to intensity of illness or inability to take PO, and Inpatient level of care appropriate due to severity of illness   Dispo: The patient is from: Home              Anticipated d/c is to: Home              Patient currently is not medically stable to d/c.              Difficult to place patient: No  TOTAL TIME TAKING CARE OF THIS PATIENT: 55 minutes.     Christel Mormon M.D on 12/21/2020 at 4:03 PM  [facility]   From 7 PM-7 AM, contact night-coverage [email or web address]  CC: Primary care physician; Konrad Saha, MD

## 2020-12-22 ENCOUNTER — Inpatient Hospital Stay: Payer: Medicare Other

## 2020-12-22 DIAGNOSIS — L03113 Cellulitis of right upper limb: Secondary | ICD-10-CM | POA: Diagnosis not present

## 2020-12-22 DIAGNOSIS — J45902 Unspecified asthma with status asthmaticus: Secondary | ICD-10-CM

## 2020-12-22 DIAGNOSIS — F419 Anxiety disorder, unspecified: Secondary | ICD-10-CM | POA: Diagnosis not present

## 2020-12-22 DIAGNOSIS — G2581 Restless legs syndrome: Secondary | ICD-10-CM | POA: Diagnosis not present

## 2020-12-22 LAB — GLUCOSE, CAPILLARY
Glucose-Capillary: 158 mg/dL — ABNORMAL HIGH (ref 70–99)
Glucose-Capillary: 242 mg/dL — ABNORMAL HIGH (ref 70–99)
Glucose-Capillary: 196 mg/dL — ABNORMAL HIGH (ref 70–99)
Glucose-Capillary: 157 mg/dL — ABNORMAL HIGH (ref 70–99)
Glucose-Capillary: 233 mg/dL — ABNORMAL HIGH (ref 70–99)

## 2020-12-22 LAB — CBC
HCT: 36.7 % (ref 36.0–46.0)
Hemoglobin: 12.1 g/dL (ref 12.0–15.0)
MCH: 31.3 pg (ref 26.0–34.0)
MCHC: 33 g/dL (ref 30.0–36.0)
MCV: 94.8 fL (ref 80.0–100.0)
Platelets: 153 10*3/uL (ref 150–400)
RBC: 3.87 MIL/uL (ref 3.87–5.11)
RDW: 13.9 % (ref 11.5–15.5)
WBC: 11.2 10*3/uL — ABNORMAL HIGH (ref 4.0–10.5)
nRBC: 0 % (ref 0.0–0.2)

## 2020-12-22 LAB — BASIC METABOLIC PANEL
Anion gap: 11 (ref 5–15)
BUN: 15 mg/dL (ref 8–23)
CO2: 18 mmol/L — ABNORMAL LOW (ref 22–32)
Calcium: 7.6 mg/dL — ABNORMAL LOW (ref 8.9–10.3)
Chloride: 104 mmol/L (ref 98–111)
Creatinine, Ser: 0.65 mg/dL (ref 0.44–1.00)
GFR, Estimated: >60 mL/min (ref >60)
Glucose, Bld: 187 mg/dL — ABNORMAL HIGH (ref 70–99)
Potassium: 4.5 mmol/L (ref 3.5–5.1)
Sodium: 133 mmol/L — ABNORMAL LOW (ref 135–145)

## 2020-12-22 IMAGING — CT CT HEAD W/O CM
4 series · 17 of 47 positions shown, 19 images · non-contrast
Comparison: CT orbits dated [DATE].  CT head dated [DATE].

CLINICAL DATA: Fall

EXAM:
CT HEAD WITHOUT CONTRAST
TECHNIQUE: Contiguous axial images were obtained from the base of the skull
through the vertex without intravenous contrast.

[Series 2: head wo · axial · 0.42mm/px · z∈[-150,-30]mm · 7 of 33 slices shown, 9 images]
[im 5/33  brain]
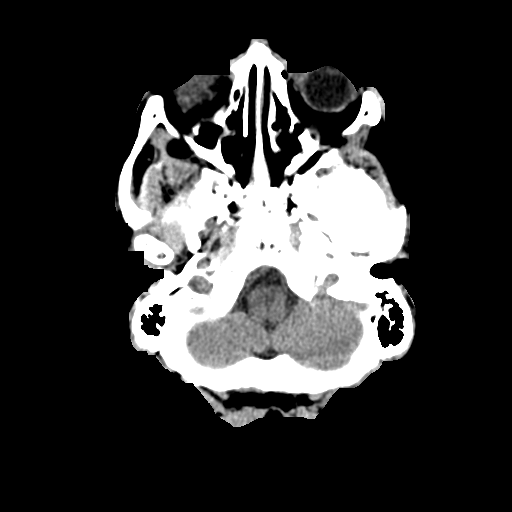
[im 5/33  bone]
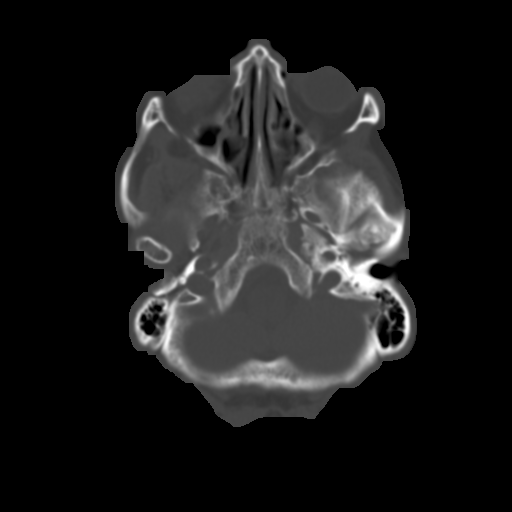
[im 9/33  brain]
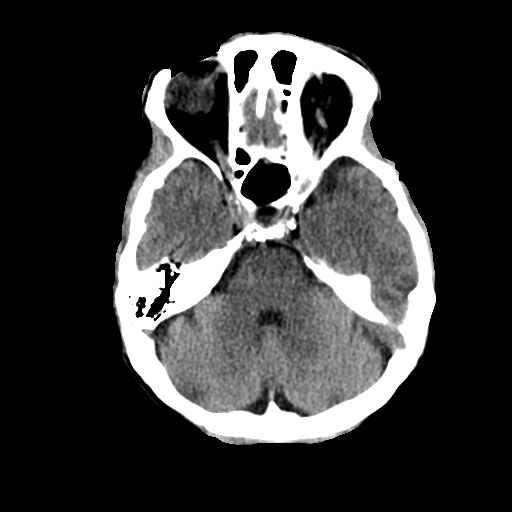
[im 13/33  brain]
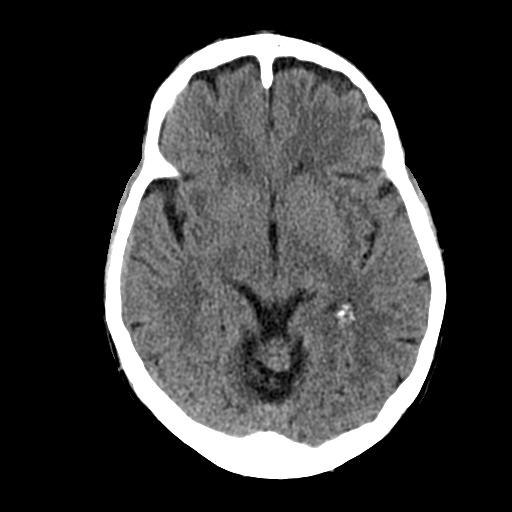
[im 17/33  brain]
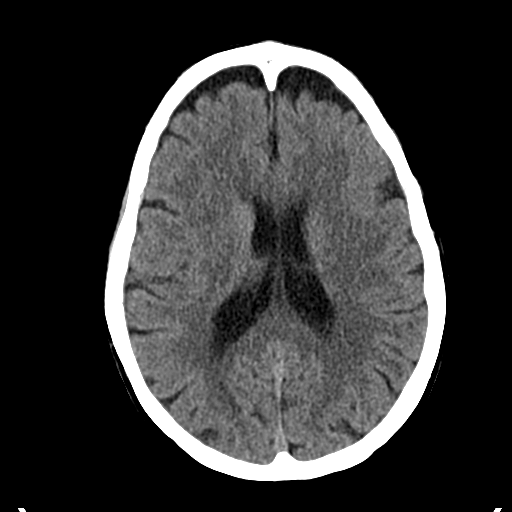
[im 21/33  brain]
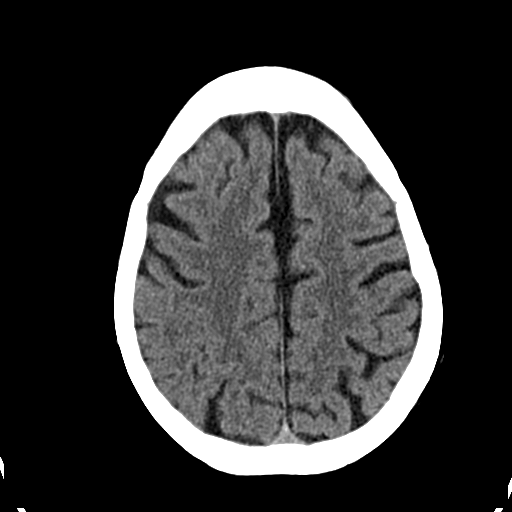
[im 21/33  bone]
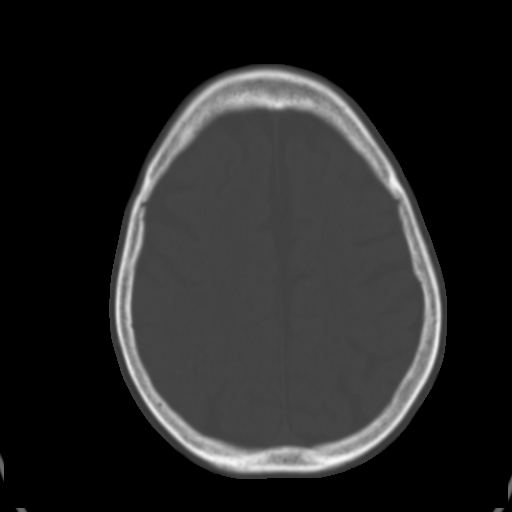
[im 25/33  brain]
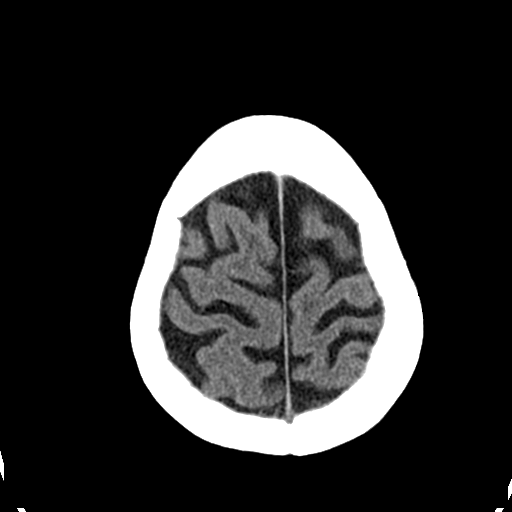
[im 29/33  brain]
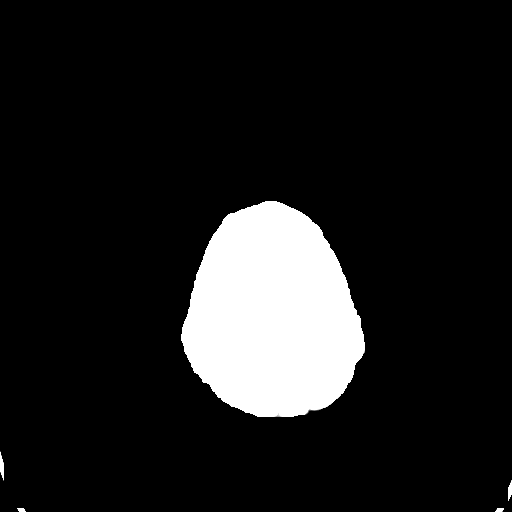

[Series 3: head bone · axial · 0.42mm/px · z∈[-154,-98]mm · 4 of 82 slices shown]
[im 9/82  bone]
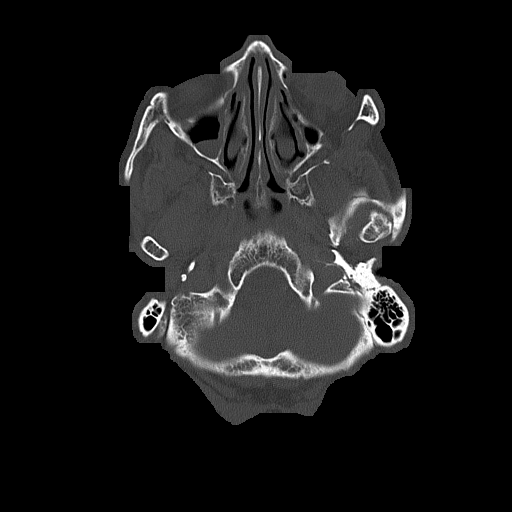
[im 17/82  bone]
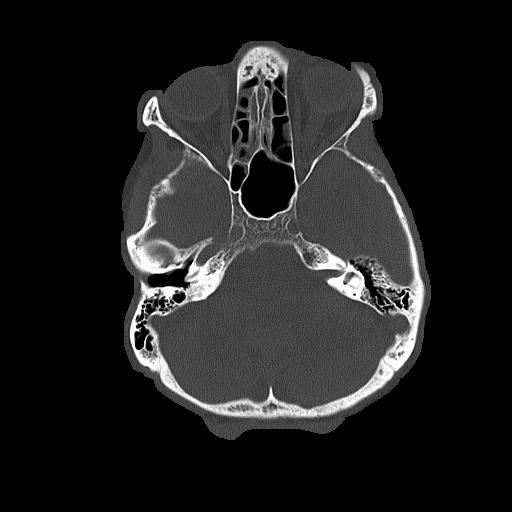
[im 25/82  bone]
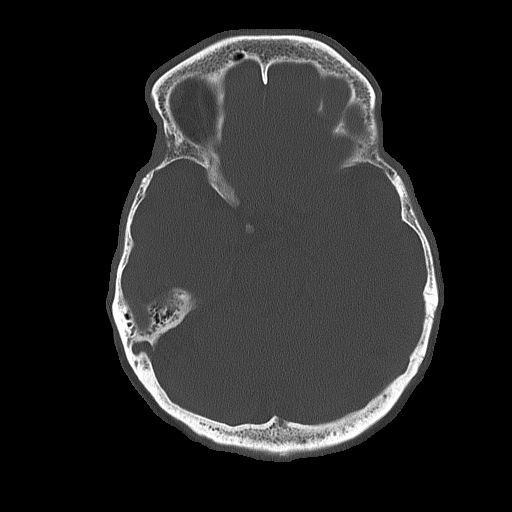
[im 37/82  bone]
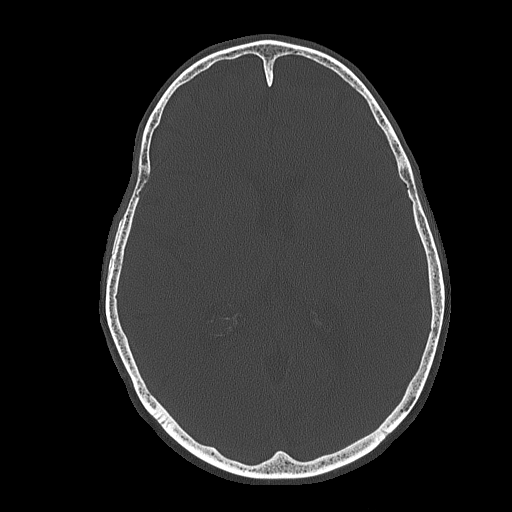

[Series 4: coronal soft tissue · coronal · 0.33mm/px · 3 of 68 slices shown]
[im 23/68  brain]
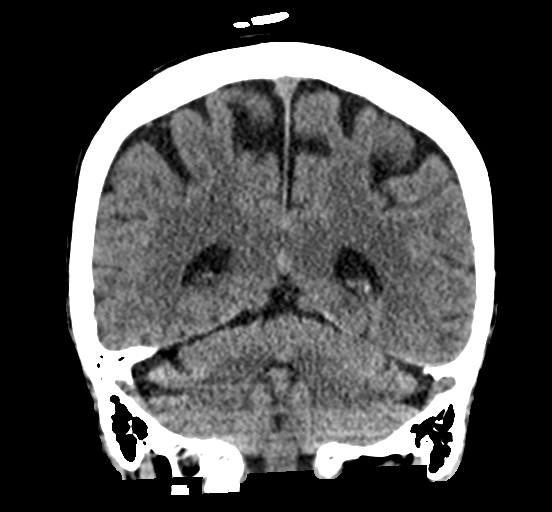
[im 30/68  brain]
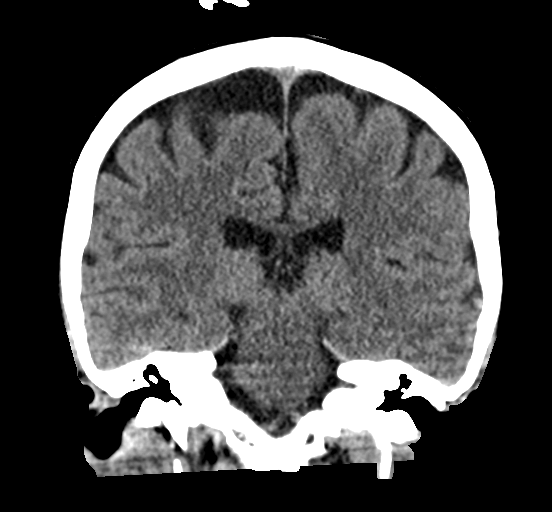
[im 38/68  brain]
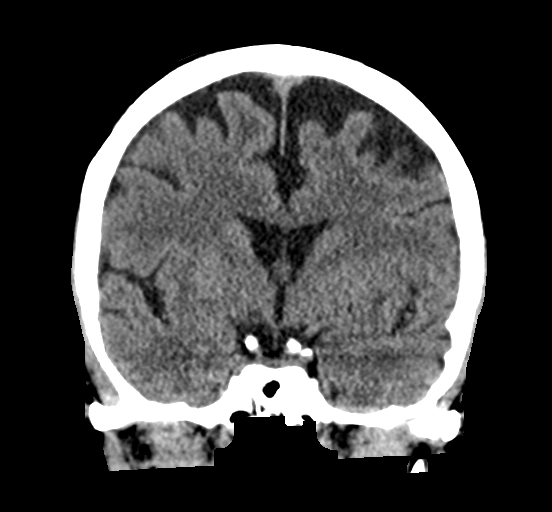

[Series 5: sagittal soft tissue · sagittal · 0.33mm/px · 3 of 59 slices shown]
[im 20/59  brain]
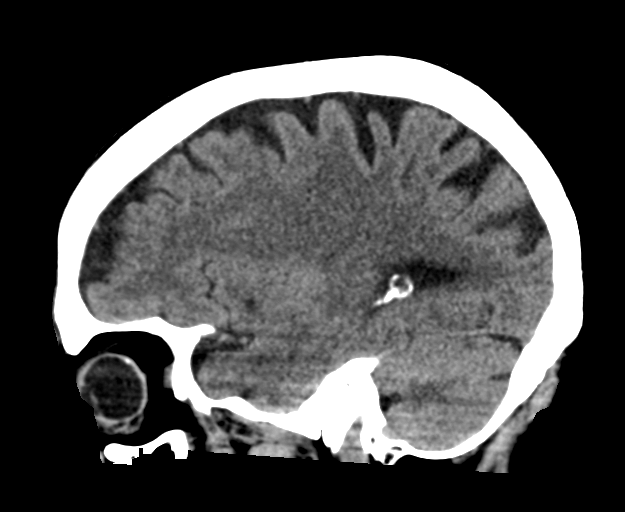
[im 30/59  brain]
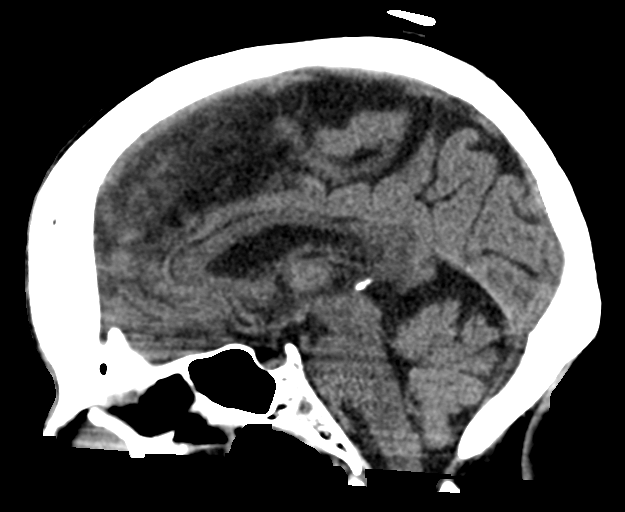
[im 39/59  brain]
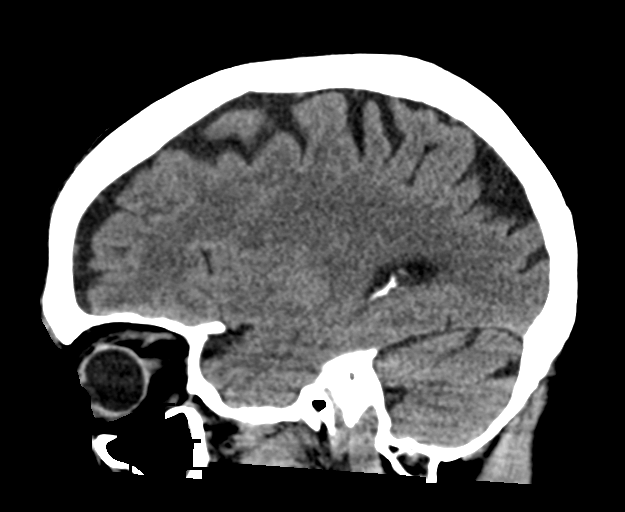

[17 of 47 positions shown; findings below may reference images not displayed]

FINDINGS: Brain: No evidence of acute infarction, hemorrhage, hydrocephalus,
extra-axial collection or mass lesion/mass effect.

Mild subcortical white matter and periventricular small vessel
ischemic changes.

Vascular: Mild intracranial atherosclerosis.

Skull: Normal. Negative for fracture or focal lesion.

Sinuses/Orbits: Partial opacification of the right maxillary sinus.
Visualized paranasal sinuses and mastoid air cells are otherwise
clear.

Other: None.
IMPRESSION: No evidence of acute intracranial abnormality. Mild small vessel
ischemic changes.

## 2020-12-22 MED ORDER — VANCOMYCIN HCL 750 MG/150ML IV SOLN
750.0000 mg | INTRAVENOUS | Status: DC
  Administered 2020-12-23 – 2020-12-24 (×2): 750 mg via INTRAVENOUS
  Filled 2020-12-22 (×3): qty 150

## 2020-12-22 MED ORDER — TRAMADOL HCL 50 MG PO TABS
50.0000 mg | ORAL_TABLET | Freq: Four times a day (QID) | ORAL | Status: DC | PRN
  Administered 2020-12-22 – 2020-12-25 (×8): 50 mg via ORAL
  Filled 2020-12-22 (×8): qty 1

## 2020-12-22 MED ORDER — ORAL CARE MOUTH RINSE
15.0000 mL | Freq: Two times a day (BID) | OROMUCOSAL | Status: DC
  Administered 2020-12-22 – 2020-12-23 (×3): 15 mL via OROMUCOSAL

## 2020-12-22 NOTE — Consult Note (Signed)
ORTHOPAEDIC CONSULTATION  REQUESTING PHYSICIAN: Joycelyn Das, MD  Chief Complaint:   R wrist pain  History of Present Illness: Suzanne Snyder is a 70 y.o. female who has a 2-3 day history of increasing R wrist and forearm pain. Of note, she was recently admitted on 12/10/20 after having an abscess in the region of her left inner thigh (where she has an insulin pump that is changed out every ~3 days).  She underwent I&D In the emergency department and started on antibiotics.  Organism was MRSA at the time.  She was given a dose of dalbavancin and discharged on 12/11/2020.  She had outpatient follow-up with the infectious diseases team on 12/17/2020, and seem to have cleared the infection at that time.  Patient was started on IV antibiotics in the emergency department.  An MRI was obtained, the patient could not fully tolerate MRI to allow for all sequences.  She was then admitted to the hospitalist service.  She does smoke daily.  Past Medical History:  Diagnosis Date   Anxiety    Arthritis    Asthma    Diabetes mellitus without complication (HCC)    Insulin pump in place    Osteoporosis    RLS (restless legs syndrome)    Smokers' cough (HCC)    Wears dentures    full upper and lower   Past Surgical History:  Procedure Laterality Date   CATARACT EXTRACTION W/PHACO Right 02/20/2020   Procedure: CATARACT EXTRACTION PHACO AND INTRAOCULAR LENS PLACEMENT (IOC) RIGHT 5.60 00:36.3;  Surgeon: Galen Manila, MD;  Location: [facility];  Service: Ophthalmology;  Laterality: Right;  Diabetic - insulin pump Requests not first   CATARACT EXTRACTION W/PHACO Left 03/05/2020   Procedure: CATARACT EXTRACTION PHACO AND INTRAOCULAR LENS PLACEMENT (IOC) LEFT 4.94 00:31.6;  Surgeon: Galen Manila, MD;  Location: [facility];  Service: Ophthalmology;  Laterality: Left;   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    ROTATOR CUFF REPAIR Left 2014 and 2015   SPINE SURGERY N/A 1984   L4-5, not sure of procedure   THYROID SURGERY     Social History   Socioeconomic History   Marital status: Married    Spouse name: Not on file   Number of children: Not on file   Years of education: Not on file   Highest education level: Not on file  Occupational History   Not on file  Tobacco Use   Smoking status: Every Day    Packs/day: 1.00    Years: 50.00    Pack years: 50.00    Types: Cigarettes   Smokeless tobacco: Never   Tobacco comments:    started smoking age 44  Vaping Use   Vaping Use: Former   Devices: "tried itChiropractor and Sexual Activity   Alcohol use: No   Drug use: No   Sexual activity: Not on file  Other Topics Concern   Not on file  Social History Narrative   Not on file   Social Determinants of Health   Financial Resource Strain: Not on file  Food Insecurity: Not on file  Transportation Needs: Not on file  Physical Activity: Not on file  Stress: Not on file  Social Connections: Not on file   Family History  Problem Relation Age of Onset   Parkinsonism Father    Allergies  Allergen Reactions   Pregabalin Anaphylaxis   Erythromycin Other (See Comments)    Severe stomach upset   Prior to Admission medications  Medication Sig Start Date End Date Taking? Authorizing Provider  albuterol (VENTOLIN HFA) 108 (90 Base) MCG/ACT inhaler Inhale into the lungs every 6 (six) hours as needed for wheezing or shortness of breath.   Yes [provider]  aspirin 81 MG EC tablet Take 81 mg by mouth daily. Swallow whole.   Yes [provider]  clonazePAM (KLONOPIN) 0.5 MG tablet Take 0.5 mg by mouth 2 (two) times daily as needed for anxiety.   Yes [provider]  cyclobenzaprine (FLEXERIL) 10 MG tablet Take 10 mg by mouth 3 (three) times daily as needed for muscle spasms.   Yes [provider]  diphenhydrAMINE HCl (BENADRYL ALLERGY PO) Take 50 mg by  mouth as needed.   Yes [provider]  gabapentin (NEURONTIN) 300 MG capsule Take 300 mg by mouth 4 (four) times daily.   Yes [provider]  hydrOXYzine (ATARAX/VISTARIL) 10 MG tablet TAKE 1 OR 2 TABLET BY MOUTH THREE TIMES DAILY AS NEEDED FOR [facility] 03/19/20  Yes Willeen Niece, MD  insulin aspart (NOVOLOG) 100 UNIT/ML injection Inject into the skin 4 (four) times daily as needed for high blood sugar (with meals and at bedtime). Sliding scale   Yes [provider]  Multiple Vitamin (MULTIVITAMIN ADULT PO) Take by mouth 2 (two) times daily.   Yes [provider]  rOPINIRole (REQUIP) 1 MG tablet Take 1.5 mg by mouth at bedtime.   Yes [provider]  Vitamin D, Ergocalciferol, (DRISDOL) 50000 UNITS CAPS capsule Take 50,000 Units by mouth 2 (two) times a week.   Yes [provider]  acetaminophen (TYLENOL) 325 MG tablet Take 650 mg by mouth every 6 (six) hours as needed.    [provider]  ammonium lactate (LAC-HYDRIN) 12 % cream Apply topically as needed for dry skin. Apply to affected areas of hands and feet 03/19/20   Willeen Niece, MD  celecoxib (CELEBREX) 100 MG capsule Take 100 mg by mouth 2 (two) times daily.    [provider]  ciclopirox (LOPROX) 0.77 % cream APPLY A SMALL AMOUNT TO AFFECTED AREA TWICE A DAY FOR FUNGAL INFECTION OF THE FEET. APPLY BETWEEN TOES AND ON TOENAILS Patient not taking: Reported on 12/21/2020 03/19/20   Willeen Niece, MD  ipratropium (ATROVENT) 0.02 % nebulizer solution Take 0.5 mg by nebulization every 6 (six) hours as needed for wheezing or shortness of breath.    [provider]  lidocaine (LIDODERM) 5 % Place 1 patch onto the skin daily as needed. Remove & Discard patch within 12 hours or as directed by MD Patient not taking: Reported on 12/17/2020    [provider]  montelukast (SINGULAIR) 10 MG tablet Take 10 mg by mouth at bedtime. Patient not taking: Reported on 12/21/2020     [provider]  mupirocin ointment (BACTROBAN) 2 % Apply 1 application topically 2 (two) times daily. Apply to affected area of forehead Patient not taking: Reported on 12/17/2020 05/21/20   Willeen Niece, MD  Omega-3 Fatty Acids (FISH OIL) 1000 MG CAPS Take 1.5 capsules by mouth 2 (two) times daily. 08/04/19   [provider]  tapentadol (NUCYNTA) 50 MG TABS tablet Take 50 mg by mouth. Patient not taking: Reported on 12/17/2020    [provider]  traMADol (ULTRAM) 50 MG tablet Take 1 tablet by mouth every 8 (eight) hours as needed. Patient not taking: Reported on 12/17/2020 10/15/20   [provider]  triamcinolone (KENALOG) 0.1 % Apply 1 application topically 2 (  two) times daily as needed (Rash). To affected areas of hands and back until rash cleared 03/19/20   Willeen Niece, MD  triamcinolone cream (KENALOG) 0.1 % Apply a small amount to affected area 1 to 2 times daily as needed for itchy areas on back, Avoid face, groin, and underarms. NEEDS APPT FOR FURTHER REFILLS 08/30/19   Willeen Niece, MD   Recent Labs    12/21/20 [phone] 12/22/20 0506  WBC 10.5 11.2*  HGB 13.7 12.1  HCT 41.8 36.7  PLT 271 153  K 3.9 4.5  CL 104 104  CO2 23 18*  BUN 11 15  CREATININE 0.67 0.65  GLUCOSE 175* 187*  CALCIUM 8.6* 7.6*   DG Wrist Complete Right  Result Date: 12/21/2020 CLINICAL DATA:  Swelling/pain EXAM: RIGHT WRIST - COMPLETE 3+ VIEW COMPARISON:  None. FINDINGS: Alignment is anatomic. No acute fracture. Joint spaces are preserved. There is nonspecific calcification of the TFCC. IMPRESSION: No acute osseous abnormality. Electronically Signed   By: Guadlupe Spanish M.D.   On: 12/21/2020 09:55   CT HEAD WO CONTRAST ( )  Result Date: 12/22/2020 CLINICAL DATA:  Fall EXAM: CT HEAD WITHOUT CONTRAST TECHNIQUE: Contiguous axial images were obtained from the base of the skull through the vertex without intravenous contrast. COMPARISON:  CT orbits dated 05/20/2020.  CT head  dated 11/24/2010. FINDINGS: Brain: No evidence of acute infarction, hemorrhage, hydrocephalus, extra-axial collection or mass lesion/mass effect. Mild subcortical white matter and periventricular small vessel ischemic changes. Vascular: Mild intracranial atherosclerosis. Skull: Normal. Negative for fracture or focal lesion. Sinuses/Orbits: Partial opacification of the right maxillary sinus. Visualized paranasal sinuses and mastoid air cells are otherwise clear. Other: None. IMPRESSION: No evidence of acute intracranial abnormality. Mild small vessel ischemic changes. Electronically Signed   By: Charline Bills M.D.   On: 12/22/2020 03:20   MR WRIST RIGHT WO CONTRAST  Result Date: 12/21/2020 CLINICAL DATA:  Wrist pain, infection suspected.  No known injury. EXAM: MR OF THE RIGHT WRIST WITHOUT CONTRAST TECHNIQUE: Multiplanar, multisequence MR imaging of the right wrist was performed. No intravenous contrast was administered. COMPARISON:  X-ray 12/21/2020 FINDINGS: Motion degraded coronal and axial T1 and T2 fat saturated sequences were obtained. Patient terminated the exam and no further sequences were able to be obtained. Best possible images submitted for interpretation. Bones/Joint/Cartilage No evidence of acute fracture or dislocation. Mild degenerative changes, most pronounced at the first [facility] joint. Small subchondral cyst or intraosseous ganglion within the hamate. Patchy marrow edema within the capitate and hamate, which may be degenerative. No focal erosion evident. Small radiocarpal and distal radioulnar joint effusions, nonspecific. Ligaments Intrinsic wrist ligaments appear grossly intact. Suspected tear of the articular disc of the TFCC (series 11, image 8). Muscles and Tendons Tenosynovitis within the flexor tendons extending into the visualized aspect of the distal forearm. Trace tenosynovitis involving the third extensor compartment distal to the level of the distal intersection. Soft tissues  Mild soft tissue edema. No organized extra-articular fluid collection is seen. IMPRESSION: 1. Motion degraded, incomplete exam. 2. Tenosynovitis involving the flexor tendons extending into the visualized aspect of the distal forearm. Trace tenosynovitis involving the third extensor compartment. Findings are nonspecific and could reflect infectious or inflammatory tenosynovitis. 3. Small radiocarpal and distal radioulnar joint effusions, nonspecific. Septic arthritis not excluded. 4. Suspected tear of the articular disc of the TFCC. 5. No acute osseous findings. Electronically Signed   By: Duanne Guess D.O.   On: 12/21/2020 16:50     Positive ROS: All other  systems have been reviewed and were otherwise negative with the exception of those mentioned in the HPI and as above.  Physical Exam: BP (!) 142/54 (BP Location: Left Arm)   Pulse 60   Temp 97.9 F (36.6 C)   Resp 16   Ht  (1.753 m)   Wt 50.1 kg   SpO2 100%   BMI 16.31 kg/m  General:  Alert, no acute distress Psychiatric:  Patient is competent for consent with normal mood and affect   Cardiovascular:  No pedal edema, regular rate and rhythm Respiratory:  No wheezing, non-labored breathing GI:  Abdomen is soft and non-tender Skin:  No lesions in the area of chief complaint,  +erythema, see below Neurologic:  Sensation intact distally, CN grossly intact Lymphatic:  No axillary or cervical lymphadenopathy  Orthopedic Exam:  RUE: +ain/pin/u motor SILT r/u/m +rad pulse There is erythema and soft tissue swelling about the dorsal aspect of the wrist towards.  There is mild-moderate tenderness in this region. Patient has passive range of motion of the wrist to approximately 30 degrees extension and 50 degrees flexion compared to 50 degrees extension and 70 degrees flexion on contralateral side.  She has no significant pain with in this arc of motion and only pain at the end ranges.  There is no significant swelling or tenderness  about the hand itself.  No Kanavel signs suggestive of infectious flexor tenosynovitis of the hand.   Imaging:  Please see above radiologist read for further details.  On personal review, radiographs show no fractures or dislocations.  MRI of the wrist with limited sequences fluid around the extensor and flexor tendons in the region of the wrist.  There is also a small wrist joint effusion.  Assessment/Plan: 70 year old female with likely tenosynovitis of the flexor and extensor tendons at the level of the wrist as well as overlying cellulitis.  Clinical exam is not suggestive of septic arthritis of the wrist given relatively pain-free range of motion.  Aspiration was not performed due to to this clinical examination as well as risk of seeding the joint given likely superficial soft tissue infection. 1.  Recommend continued IV antibiotics.  2.  Elevate hand as much as possible.  3.  We will plan to reevaluate tomorrow to ensure no worsening of symptoms.  4.  No plan for surgical intervention at this point.  N.p.o. after midnight in case patient examination worsens.    Signa Kell   12/22/2020 8:42 AM

## 2020-12-22 NOTE — Progress Notes (Addendum)
PROGRESS NOTE  Suzanne Snyder VCB:[id number] DOB: May 13, 1950 DOA: 12/21/2020 PCP: Katrinka Blazing, MD   LOS: 1 day   Brief narrative:  Suzanne Snyder is a 69 y.o. female with past medical history of asthma, arthritis, anxiety, type 2 diabetes, osteoporosis, restless leg syndrome presented to hospital with wrist pain and swelling.  Of note patient was recently admitted to hospital for right-sided thigh purulent cellulitis and had I&D done and was discharged on 12/11/2020.  In the ED, respiratory rate was slightly elevated.  Blood glucose level was elevated at 175.  CBC within normal limits.  Sed rate was 42.  Blood cultures were obtained.  X-ray showed no evidence of bony abnormality.  MRI of the right wrist was performed to rule out septic arthritis which showed tenosynovitis involving the flexor tendons, joint effusion/septic arthritis not excluded.   Orthopedics was notified.Patient was given IV vancomycin and antibiotic and was admitted to hospital.    Assessment/Plan:  Principal Problem:   Cellulitis of right wrist  Cellulitis of the right wrist and distal forearm  Currently on IV vancomycin and cefepime.    Continue supportive care including pain management.  Patient does have a history of right thigh purulent cellulitis recently.  Blood cultures negative in less than 24 hours.  WBC elevated at 11.2.  Communicated with orthopedics who recommended IV antibiotics for now with low suspicion for septic arthritis.  No joint aspiration plan.  Right upper extremity duplex been ordered.  Type 2 diabetes mellitus with peripheral neuropathy. Continue sliding scale insulin, Accu-Cheks, diabetic diet.  Monitor blood glucose levels.   Asthma, controlled. Continue albuterol, Singulair.   Restless leg syndrome. Continue Requip.   Peripheral neuropathy. Continue Neurontin.  Anxiety. Continue Klonopin  Recent right thigh wound. Will get wound care.  DVT prophylaxis: enoxaparin (LOVENOX)  injection 40 mg Start: 12/21/20 2200   Code Status: Full code  Family Communication: Spoke with the patient at bedside  Status is: Inpatient  Remains inpatient appropriate because: Need for IV antibiotics, orthopedic consultation  Consultants: Orthopedics Infectious disease  Procedures: None  Anti-infectives:  Vancomycin and cefepime  Anti-infectives (From admission, onward)    Start     Dose/Rate Route Frequency Ordered Stop   12/22/20 0600  vancomycin (VANCOCIN) IVPB 1000 mg/200 mL premix        1,000 mg 200 mL/hr over 60 Minutes Intravenous Every 24 hours 12/21/20 1952     12/22/20 0500  ceFEPIme (MAXIPIME) 2 g in sodium chloride 0.9 % 100 mL IVPB        2 g 200 mL/hr over 30 Minutes Intravenous Every 12 hours 12/21/20 1952     12/21/20 1600  vancomycin (VANCOCIN) IVPB 1000 mg/200 mL premix  Status:  Discontinued        1,000 mg 200 mL/hr over 60 Minutes Intravenous  Once 12/21/20 1550 12/21/20 1557   12/21/20 1600  ceFEPIme (MAXIPIME) 2 g in sodium chloride 0.9 % 100 mL IVPB  Status:  Discontinued        2 g 200 mL/hr over 30 Minutes Intravenous  Once 12/21/20 1550 12/21/20 1558   12/21/20 1545  ceFEPIme (MAXIPIME) 2 g in sodium chloride 0.9 % 100 mL IVPB        2 g 200 mL/hr over 30 Minutes Intravenous  Once 12/21/20 1543 12/21/20 1727   12/21/20 1545  vancomycin (VANCOREADY) IVPB 1250 mg/250 mL        1,250 mg 166.7 mL/hr over 90 Minutes Intravenous  Once 12/21/20 1543  12/21/20 1954       Subjective: Today, patient was seen and examined at bedside.  Complains of right wrist pain.  Denies any fever chills or rigor.  Objective: Vitals:   12/22/20 0318 12/22/20 0421  BP:  (!) 122/52  Pulse: 77 74  Resp: 18 20  Temp:  98.9 F (37.2 C)  SpO2: 96% 98%    Intake/Output Summary (Last 24 hours) at 12/22/2020 0730 Last data filed at 12/22/2020 0500 Gross per 24 hour  Intake 1295.53 ml  Output --  Net 1295.53 ml   Filed Weights   12/21/20 0841 12/21/20  1958  Weight: 54 kg 50.1 kg   Body mass index is 16.31 kg/m.   Physical Exam:  GENERAL: Patient is alert awake and oriented. Not in obvious distress.  Thinly built, HENT: No scleral pallor or icterus. Pupils equally reactive to light. Oral mucosa is moist NECK: is supple, no gross swelling noted. CHEST: Clear to auscultation. No crackles or wheezes.  Diminished breath sounds bilaterally. CVS: S1 and S2 heard, no murmur. Regular rate and rhythm.  ABDOMEN: Soft, non-tender, bowel sounds are present. EXTREMITIES: Right wrist and distal forearm with erythema and tenderness with restriction of joint movements.  Right thigh with dressing CNS: Cranial nerves are intact. No focal motor deficits. SKIN: warm and dry without rashes.   Data Review: I have personally reviewed the following laboratory data and studies,  CBC: Recent Labs  Lab 12/21/20 0850 12/22/20 0506  WBC 10.5 11.2*  NEUTROABS 7.4  --   HGB 13.7 12.1  HCT 41.8 36.7  MCV 95.9 94.8  PLT 271 153   Basic Metabolic Panel: Recent Labs  Lab 12/21/20 0850 12/22/20 0506  NA 136 133*  K 3.9 4.5  CL 104 104  CO2 23 18*  GLUCOSE 175* 187*  BUN 11 15  CREATININE 0.67 0.65  CALCIUM 8.6* 7.6*   Liver Function Tests: Recent Labs  Lab 12/21/20 0850  AST 16  ALT 15  ALKPHOS 65  BILITOT 0.9  PROT 7.5  ALBUMIN 4.1   No results for input(s): LIPASE, AMYLASE in the last 168 hours. No results for input(s): AMMONIA in the last 168 hours. Cardiac Enzymes: No results for input(s): CKTOTAL, CKMB, CKMBINDEX, TROPONINI in the last 168 hours. BNP (last 3 results) No results for input(s): BNP in the last 8760 hours.  ProBNP (last 3 results) No results for input(s): PROBNP in the last 8760 hours.  CBG: Recent Labs  Lab 12/21/20 1958 12/21/20 2240 12/22/20 0300  GLUCAP 187* 220* 158*   Recent Results (from the past 240 hour(s))  MRSA Next Gen by PCR, Nasal     Status: None   Collection Time: 12/17/20  1:30 PM    Specimen: Nasal Mucosa; Nasal Swab  Result Value Ref Range Status   MRSA by PCR Next Gen NOT DETECTED NOT DETECTED Final    Comment: (NOTE) The GeneXpert MRSA Assay (FDA approved for NASAL specimens only), is one component of a comprehensive MRSA colonization surveillance program. It is not intended to diagnose MRSA infection nor to guide or monitor treatment for MRSA infections. Test performance is not FDA approved in patients less than 86 years old. Performed at [facility], [address] [place], [place] [address]   Culture, blood (Routine X 2) w Reflex to ID Panel     Status: None (Preliminary result)   Collection Time: 12/21/20 11:09 AM   Specimen: BLOOD  Result Value Ref Range Status   Specimen Description  BLOOD LEFT UPPER ARM  Final   Special Requests   Final    BOTTLES DRAWN AEROBIC AND ANAEROBIC Blood Culture results may not be optimal due to an inadequate volume of blood received in culture bottles   Culture   Final    NO GROWTH < 24 HOURS Performed at [facility] [address] [place], [place] [address]    Report Status PENDING  Incomplete  Culture, blood (Routine X 2) w Reflex to ID Panel     Status: None (Preliminary result)   Collection Time: 12/21/20 11:11 AM   Specimen: BLOOD  Result Value Ref Range Status   Specimen Description BLOOD BLOOD RIGHT FOREARM  Final   Special Requests   Final    BOTTLES DRAWN AEROBIC AND ANAEROBIC Blood Culture adequate volume   Culture   Final    NO GROWTH < 24 HOURS Performed at [facility], [address], [place], [place] [address]    Report Status PENDING  Incomplete  Resp Panel by RT-PCR (Flu A&B, Covid) Nasopharyngeal Swab     Status: None   Collection Time: 12/21/20  3:49 PM   Specimen: Nasopharyngeal Swab; Nasopharyngeal(NP) swabs in vial transport medium  Result Value Ref Range Status   SARS Coronavirus 2 by RT PCR NEGATIVE NEGATIVE Final    Comment: (NOTE) SARS-CoV-2 target nucleic  acids are NOT DETECTED.  The SARS-CoV-2 RNA is generally detectable in upper respiratory specimens during the acute phase of infection. The lowest concentration of SARS-CoV-2 viral copies this assay can detect is 138 copies/mL. A negative result does not preclude SARS-Cov-2 infection and should not be used as the sole basis for treatment or other patient management decisions. A negative result may occur with  improper specimen collection/handling, submission of specimen other than nasopharyngeal swab, presence of viral mutation(s) within the areas targeted by this assay, and inadequate number of viral copies(<138 copies/mL). A negative result must be combined with clinical observations, patient history, and epidemiological information. The expected result is Negative.  Fact Sheet for Patients:  BloggerCourse.com  Fact Sheet for Healthcare Providers:  SeriousBroker.it  This test is no t yet approved or cleared by the [place] FDA and  has been authorized for detection and/or diagnosis of SARS-CoV-2 by FDA under an Emergency Use Authorization (EUA). This EUA will remain  in effect (meaning this test can be used) for the duration of the COVID-19 declaration under Section 564(b)(1) of the Act, 21 U.S.C.section 360bbb-3(b)(1), unless the authorization is terminated  or revoked sooner.       Influenza A by PCR NEGATIVE NEGATIVE Final   Influenza B by PCR NEGATIVE NEGATIVE Final    Comment: (NOTE) The Xpert Xpress SARS-CoV-2/FLU/RSV plus assay is intended as an aid in the diagnosis of influenza from Nasopharyngeal swab specimens and should not be used as a sole basis for treatment. Nasal washings and aspirates are unacceptable for Xpert Xpress SARS-CoV-2/FLU/RSV testing.  Fact Sheet for Patients: BloggerCourse.com  Fact Sheet for Healthcare Providers: SeriousBroker.it  This  test is not yet approved or cleared by the [place] FDA and has been authorized for detection and/or diagnosis of SARS-CoV-2 by FDA under an Emergency Use Authorization (EUA). This EUA will remain in effect (meaning this test can be used) for the duration of the COVID-19 declaration under Section 564(b)(1) of the Act, 21 U.S.C. section 360bbb-3(b)(1), unless the authorization is terminated or revoked.  Performed at [facility], [address], [place], [place] [address]  Studies: DG Wrist Complete Right  Result Date: 12/21/2020 CLINICAL DATA:  Swelling/pain EXAM: RIGHT WRIST - COMPLETE 3+ VIEW COMPARISON:  None. FINDINGS: Alignment is anatomic. No acute fracture. Joint spaces are preserved. There is nonspecific calcification of the TFCC. IMPRESSION: No acute osseous abnormality. Electronically Signed   By: Guadlupe Spanish M.D.   On: 12/21/2020 09:55   CT HEAD WO CONTRAST ( )  Result Date: 12/22/2020 CLINICAL DATA:  Fall EXAM: CT HEAD WITHOUT CONTRAST TECHNIQUE: Contiguous axial images were obtained from the base of the skull through the vertex without intravenous contrast. COMPARISON:  CT orbits dated 05/20/2020.  CT head dated 11/24/2010. FINDINGS: Brain: No evidence of acute infarction, hemorrhage, hydrocephalus, extra-axial collection or mass lesion/mass effect. Mild subcortical white matter and periventricular small vessel ischemic changes. Vascular: Mild intracranial atherosclerosis. Skull: Normal. Negative for fracture or focal lesion. Sinuses/Orbits: Partial opacification of the right maxillary sinus. Visualized paranasal sinuses and mastoid air cells are otherwise clear. Other: None. IMPRESSION: No evidence of acute intracranial abnormality. Mild small vessel ischemic changes. Electronically Signed   By: Charline Bills M.D.   On: 12/22/2020 03:20   MR WRIST RIGHT WO CONTRAST  Result Date: 12/21/2020 CLINICAL DATA:  Wrist pain, infection suspected.  No known  injury. EXAM: MR OF THE RIGHT WRIST WITHOUT CONTRAST TECHNIQUE: Multiplanar, multisequence MR imaging of the right wrist was performed. No intravenous contrast was administered. COMPARISON:  X-ray 12/21/2020 FINDINGS: Motion degraded coronal and axial T1 and T2 fat saturated sequences were obtained. Patient terminated the exam and no further sequences were able to be obtained. Best possible images submitted for interpretation. Bones/Joint/Cartilage No evidence of acute fracture or dislocation. Mild degenerative changes, most pronounced at the first [facility] joint. Small subchondral cyst or intraosseous ganglion within the hamate. Patchy marrow edema within the capitate and hamate, which may be degenerative. No focal erosion evident. Small radiocarpal and distal radioulnar joint effusions, nonspecific. Ligaments Intrinsic wrist ligaments appear grossly intact. Suspected tear of the articular disc of the TFCC (series 11, image 8). Muscles and Tendons Tenosynovitis within the flexor tendons extending into the visualized aspect of the distal forearm. Trace tenosynovitis involving the third extensor compartment distal to the level of the distal intersection. Soft tissues Mild soft tissue edema. No organized extra-articular fluid collection is seen. IMPRESSION: 1. Motion degraded, incomplete exam. 2. Tenosynovitis involving the flexor tendons extending into the visualized aspect of the distal forearm. Trace tenosynovitis involving the third extensor compartment. Findings are nonspecific and could reflect infectious or inflammatory tenosynovitis. 3. Small radiocarpal and distal radioulnar joint effusions, nonspecific. Septic arthritis not excluded. 4. Suspected tear of the articular disc of the TFCC. 5. No acute osseous findings. Electronically Signed   By: Duanne Guess D.O.   On: 12/21/2020 16:50      Joycelyn Das, MD  [facility] 12/22/2020  If 7PM-7AM, please contact night-coverage

## 2020-12-22 NOTE — Consult Note (Signed)
Brief ID note:  Patient has previously followed with Dr. Rivka Safer for a right thigh soft tissue abscess.  She was in the ER on 12/10/2020 for this process and underwent I&D.  Cultures grew MRSA.  She was given a dose of dalbavancin and sent home.  She saw ID in clinic on 12/6 at which point her I&D site was much improved and no further antibiotics were deemed to be necessary.  She did well until having to present back to the emergency department yesterday with concern for a right wrist infection.  The emergency department was concerned regarding possible septic arthritis.  She was afebrile, however, she did have a mild leukocytosis of 11.2, lactate was normal, sedimentation rate was 42, and MRI was obtained which showed tenosynovitis involving the flexor tendons extending into the visualized aspect of the distal forearm.  She also had small joint effusions with a septic arthritis unable to be excluded on imaging.  She was subsequently admitted and blood cultures were obtained.  These are no growth to date.  She was started on broad-spectrum antibiotics with vancomycin and cefepime.  Her blood sugars are running high.  We are asked for antibiotic recommendations.  Problems: 1.  Right wrist cellulitis and tenosynovitis 2.  Possible septic arthritis of the right wrist 3.  Recent MRSA right thigh abscess status post I&D 11/29 and 1 dose of dalbavancin 4.  Type 2 diabetes with hemoglobin A1c pending  Reccs: --Continue vancomycin per pharmacy given recent MRSA infection --Await orthopedic surgery evaluation for possible joint aspiration to further evaluate for septic arthritis and need for washout --Follow blood cultures --Will stop cefepime at this time given recent history of MRSA and likelihood for staph or strep as a cause for her infection --Glycemic control --Formal ID consult tomorrow when Dr Rivka Safer is back   Vedia Coffer for Infectious Disease [facility] 12/22/2020, 9:03 AM

## 2020-12-22 NOTE — Progress Notes (Signed)
This nurse arrived to pt's room at 2050 in response to bed alarming. Pt sitting up on the side of the bed with her back propped against the bed and knees bent. Pt states "I did not fall; I forgot to call for help when I got up so I carefully eased myself to the floor."  Pt alert and oriented and was able to raise herself up from the floor and ambulate to the bathroom.  Pt assessed; V/S obtained; pt denied any new pain or hitting her head; no apparent injuries noted.    On call provider notified at  [phone] after discussing incident with [facility] and being advised that it should be treated as an unwitnessed fall; head CT ordered.

## 2020-12-22 NOTE — Consult Note (Signed)
Pharmacy Antibiotic Note  Suzanne Snyder is a 70 y.o. female admitted on 12/21/2020 with cellulitis.  Pharmacy has been consulted for vancomycin and cefepime dosing.  Plan: Weight update. Changing to Vancomycin 750 mg IV q24h  Goal AUC 400-550  Est AUC: 433.4 Est Cmax: 30.4 Est Cmin: 10.1 Calculated with SCr 0.8 and TBW to calculate CrCl & Ke  Cefepime 2 g IV q12h   Monitor clinical picture, renal function, vancomycin levels at steady state F/U C&S, abx deescalation / LOT   Height: 5\' 9"  (175.3 cm) Weight: 50.1 kg (110 lb 7.2 oz) IBW/kg (Calculated) : 66.2  Temp (24hrs), Avg:98.2 F (36.8 C), Min:97.9 F (36.6 C), Max:98.9 F (37.2 C)  Recent Labs  Lab 12/21/20 0850 12/21/20 1118 12/22/20 0506  WBC 10.5  --  11.2*  CREATININE 0.67  --  0.65  LATICACIDVEN  --  1.2  --      Estimated Creatinine Clearance: 52.5 mL/min (by C-G formula based on SCr of 0.65 mg/dL).    Allergies  Allergen Reactions   Pregabalin Anaphylaxis   Erythromycin Other (See Comments)    Severe stomach upset    Antimicrobials this admission: 12/10 vancomycin >>  12/10 cefepime >>   Dose adjustments this admission: N/A  Microbiology results: 12/10 BCx: NG<24hrs  Thank you for allowing pharmacy to be a part of this patient's care.  14/10, PharmD 12/22/2020 9:45 AM

## 2020-12-23 ENCOUNTER — Inpatient Hospital Stay: Payer: Medicare Other

## 2020-12-23 DIAGNOSIS — F419 Anxiety disorder, unspecified: Secondary | ICD-10-CM | POA: Diagnosis not present

## 2020-12-23 DIAGNOSIS — L03113 Cellulitis of right upper limb: Secondary | ICD-10-CM | POA: Diagnosis not present

## 2020-12-23 DIAGNOSIS — J45902 Unspecified asthma with status asthmaticus: Secondary | ICD-10-CM | POA: Diagnosis not present

## 2020-12-23 DIAGNOSIS — M659 Synovitis and tenosynovitis, unspecified: Secondary | ICD-10-CM

## 2020-12-23 DIAGNOSIS — G2581 Restless legs syndrome: Secondary | ICD-10-CM | POA: Diagnosis not present

## 2020-12-23 LAB — BASIC METABOLIC PANEL
Anion gap: 8 (ref 5–15)
BUN: 15 mg/dL (ref 8–23)
CO2: 20 mmol/L — ABNORMAL LOW (ref 22–32)
Calcium: 7.3 mg/dL — ABNORMAL LOW (ref 8.9–10.3)
Chloride: 106 mmol/L (ref 98–111)
Creatinine, Ser: 0.63 mg/dL (ref 0.44–1.00)
GFR, Estimated: >60 mL/min (ref >60)
Glucose, Bld: 207 mg/dL — ABNORMAL HIGH (ref 70–99)
Potassium: 3.8 mmol/L (ref 3.5–5.1)
Sodium: 134 mmol/L — ABNORMAL LOW (ref 135–145)

## 2020-12-23 LAB — GLUCOSE, CAPILLARY
Glucose-Capillary: 206 mg/dL — ABNORMAL HIGH (ref 70–99)
Glucose-Capillary: 270 mg/dL — ABNORMAL HIGH (ref 70–99)
Glucose-Capillary: 114 mg/dL — ABNORMAL HIGH (ref 70–99)
Glucose-Capillary: 142 mg/dL — ABNORMAL HIGH (ref 70–99)

## 2020-12-23 LAB — CBC
HCT: 32.2 % — ABNORMAL LOW (ref 36.0–46.0)
Hemoglobin: 10.5 g/dL — ABNORMAL LOW (ref 12.0–15.0)
MCH: 30.8 pg (ref 26.0–34.0)
MCHC: 32.6 g/dL (ref 30.0–36.0)
MCV: 94.4 fL (ref 80.0–100.0)
Platelets: 239 10*3/uL (ref 150–400)
RBC: 3.41 MIL/uL — ABNORMAL LOW (ref 3.87–5.11)
RDW: 13.9 % (ref 11.5–15.5)
WBC: 7.3 10*3/uL (ref 4.0–10.5)
nRBC: 0 % (ref 0.0–0.2)

## 2020-12-23 LAB — HEMOGLOBIN A1C
Hgb A1c MFr Bld: 6.5 % — ABNORMAL HIGH (ref 4.8–5.6)
Mean Plasma Glucose: 140 mg/dL

## 2020-12-23 LAB — MAGNESIUM: Magnesium: 2 mg/dL (ref 1.7–2.4)

## 2020-12-23 IMAGING — US US EXTREM  UP VENOUS*R*
1 series · 13 of 24 positions shown · non-contrast
Comparison: None.

CLINICAL DATA: Right upper extremity MELITON for 1 week

Pain
Diabetes
History of tobacco use
Color changes
EXAM:
RIGHT UPPER EXTREMITY VENOUS DOPPLER ULTRASOUND
TECHNIQUE: Gray-scale sonography with graded compression, as well as color
Doppler and duplex ultrasound were performed to evaluate the upper
extremity deep venous system from the level of the subclavian vein
and including the jugular, axillary, basilic, radial, ulnar and
upper cephalic vein. Spectral Doppler was utilized to evaluate flow
at rest and with distal augmentation maneuvers.

[Series 1: us venous img upper uni right (dvt) · portal-venous · 13 of 36 slices shown]
[im 1/36]
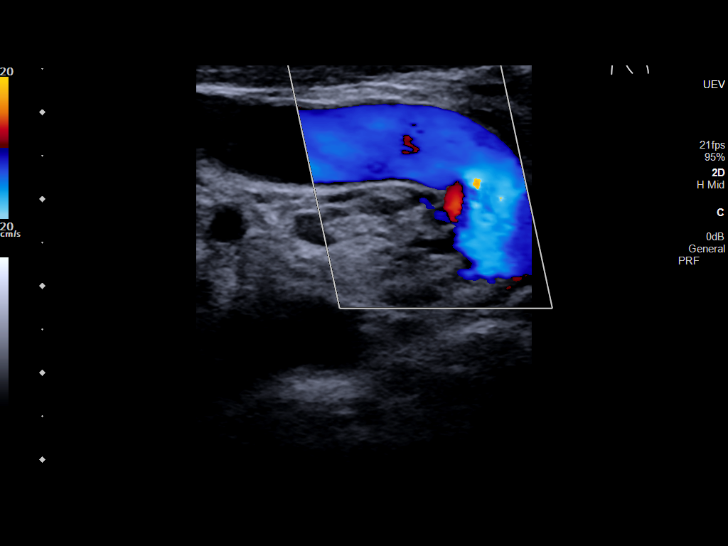
[im 4/36]
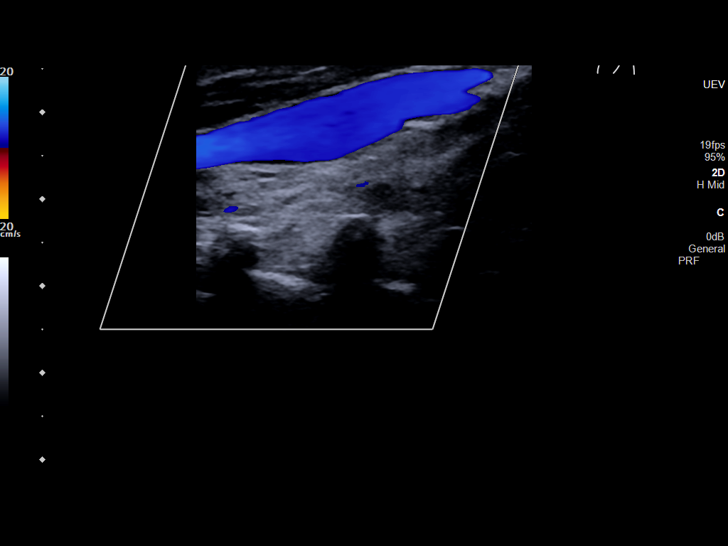
[im 7/36]
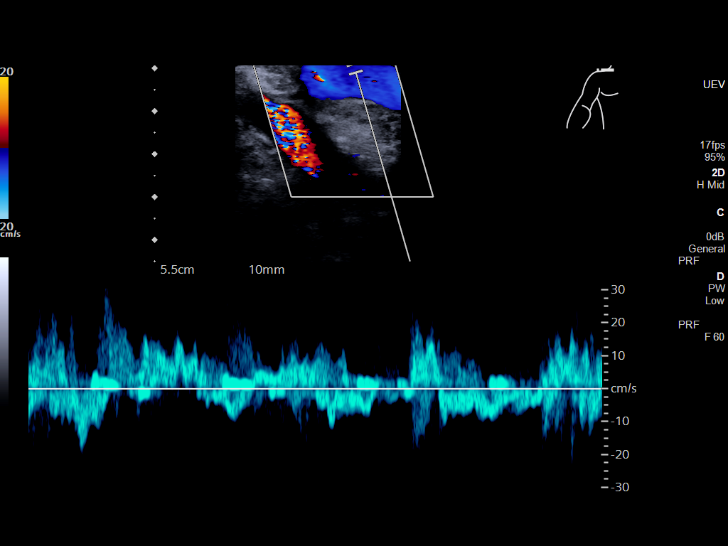
[im 10/36]
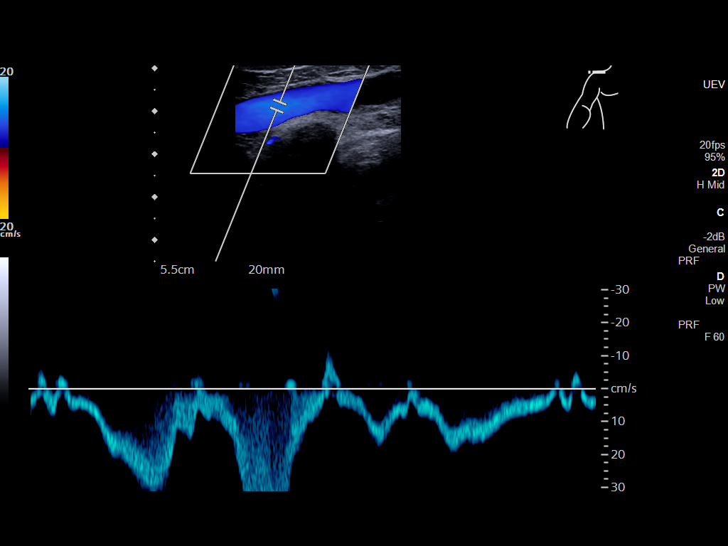
[im 13/36]
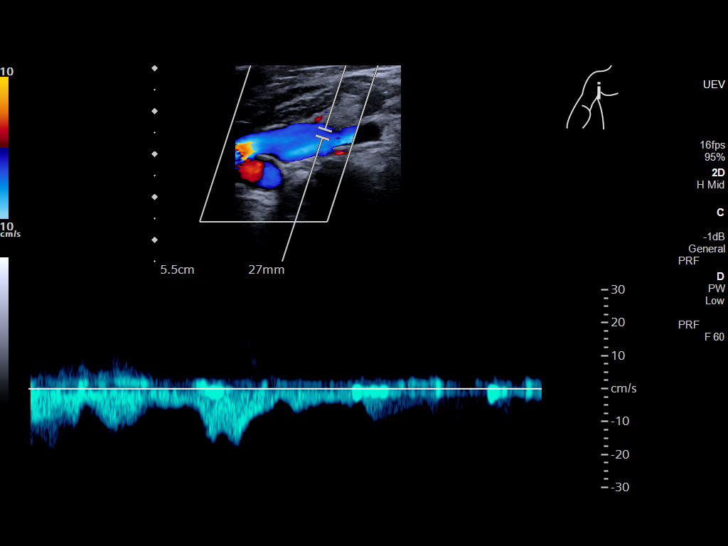
[im 16/36]
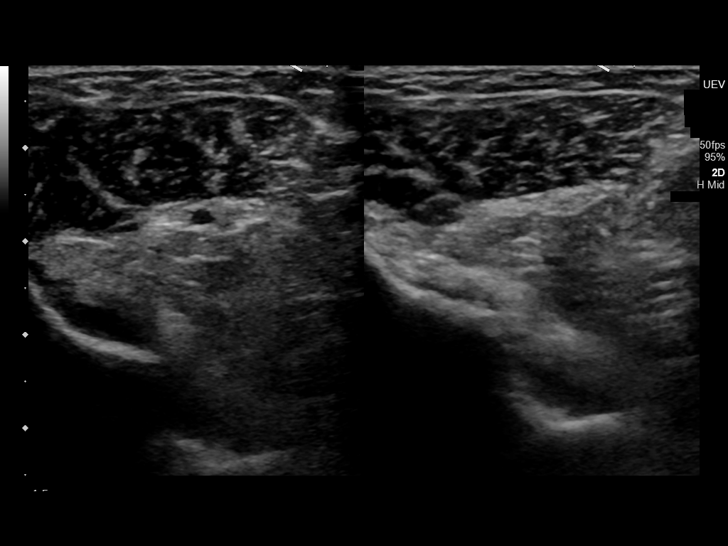
[im 19/36]
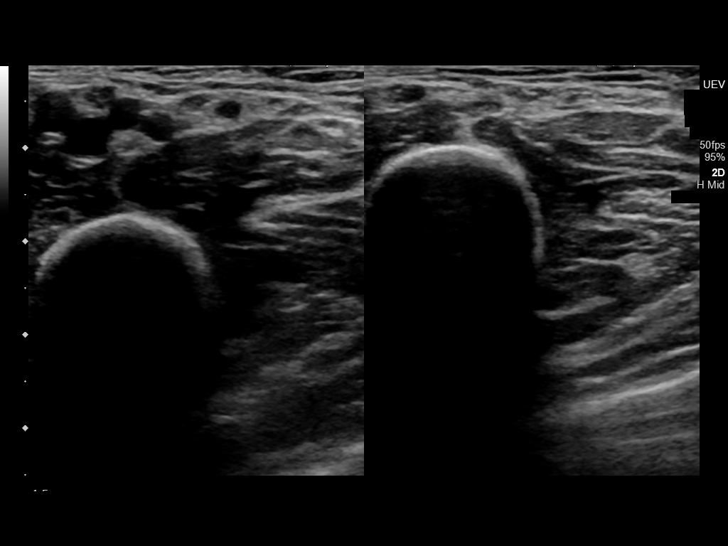
[im 20/36]
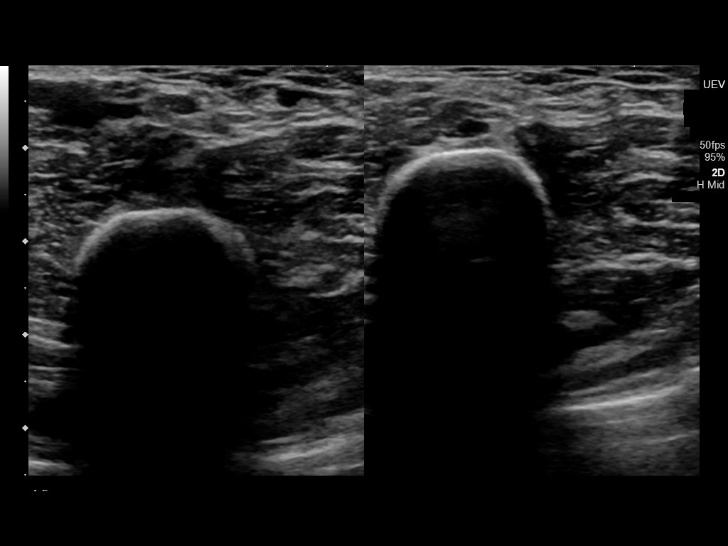
[im 23/36]
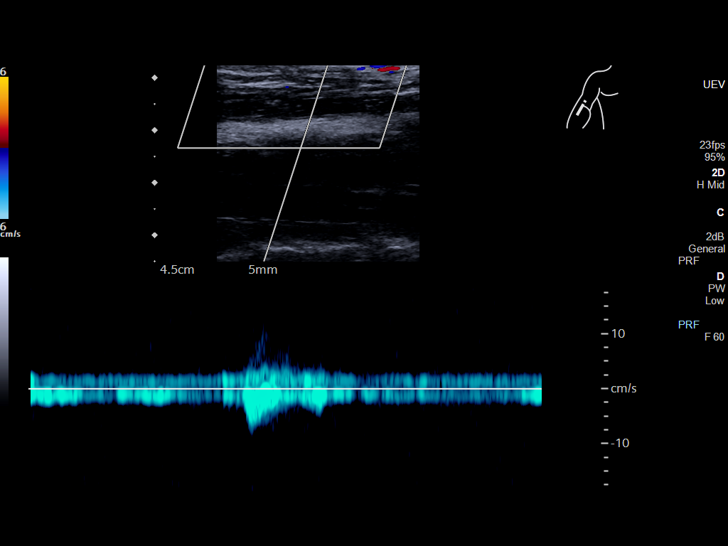
[im 26/36]
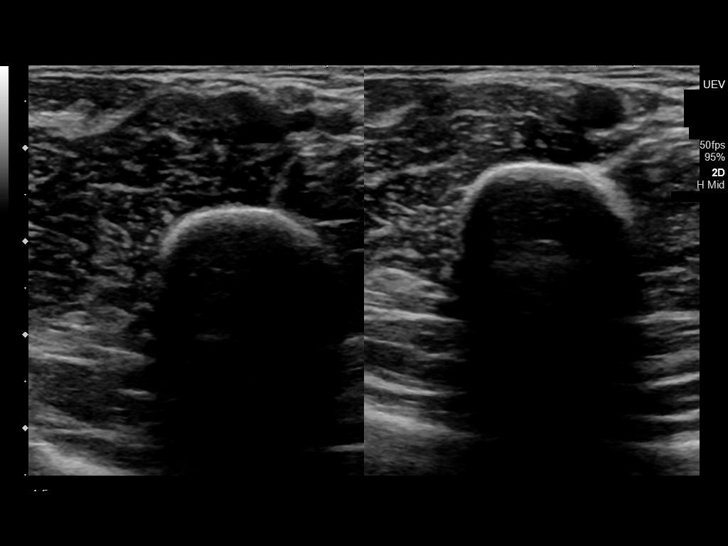
[im 29/36]
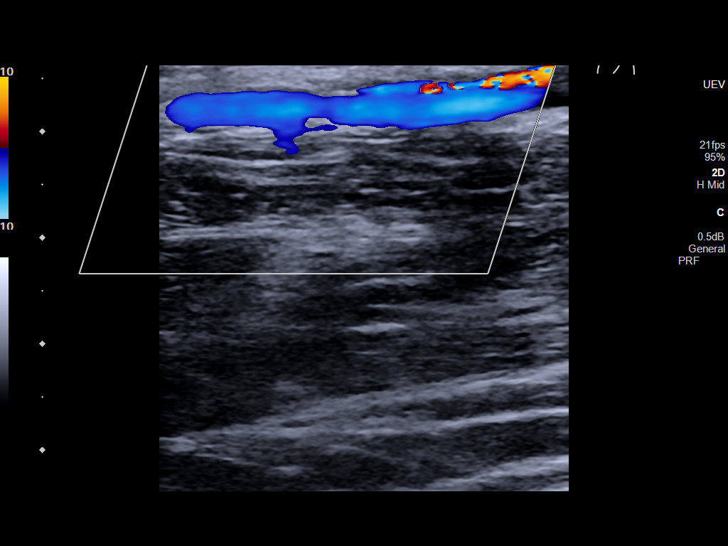
[im 32/36]
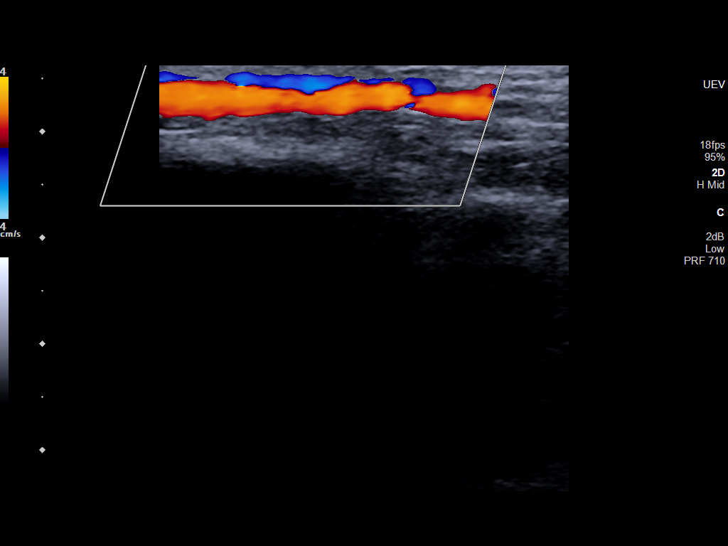
[im 36/36]
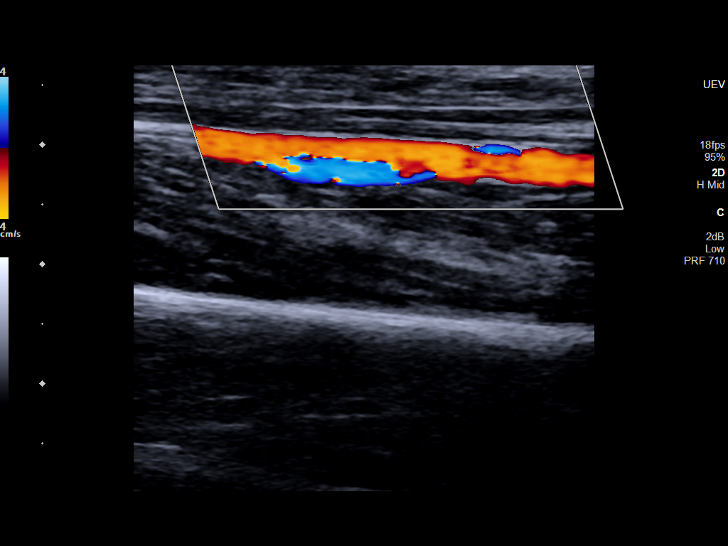

[13 of 24 positions shown; findings below may reference images not displayed]

FINDINGS: Contralateral Subclavian Vein: Respiratory phasicity is normal and
symmetric with the symptomatic side. No evidence of thrombus. Normal
compressibility.

Internal Jugular Vein: No evidence of thrombus. Normal
compressibility, respiratory phasicity and response to augmentation.

Subclavian Vein: No evidence of thrombus. Normal compressibility,
respiratory phasicity and response to augmentation.

Axillary Vein: No evidence of thrombus. Normal compressibility,
respiratory phasicity and response to augmentation.

Cephalic Vein: No evidence of thrombus. Normal compressibility,
respiratory phasicity and response to augmentation.

Basilic Vein: No evidence of thrombus. Normal compressibility,
respiratory phasicity and response to augmentation.

Brachial Veins: No evidence of thrombus. Normal compressibility,
respiratory phasicity and response to augmentation.

Radial Veins: No evidence of thrombus. Normal compressibility,
respiratory phasicity and response to augmentation.

Ulnar Veins: No evidence of thrombus. Normal compressibility,
respiratory phasicity and response to augmentation.

Other Findings:  None visualized.
IMPRESSION: No evidence of DVT within the right upper extremity.

## 2020-12-23 MED ORDER — GLUCERNA SHAKE PO LIQD: 237.0000 mL | Freq: Two times a day (BID) | ORAL | Status: DC

## 2020-12-23 MED ORDER — INSULIN ASPART 100 UNIT/ML IJ SOLN
0.0000 [IU] | Freq: Three times a day (TID) | INTRAMUSCULAR | Status: DC
  Administered 2020-12-23: 5 [IU] via SUBCUTANEOUS
  Administered 2020-12-24: 2 [IU] via SUBCUTANEOUS
  Administered 2020-12-25: 13:00:00 7 [IU] via SUBCUTANEOUS
  Administered 2020-12-25: 09:00:00 9 [IU] via SUBCUTANEOUS
  Filled 2020-12-23 (×4): qty 1

## 2020-12-23 MED ORDER — INSULIN ASPART 100 UNIT/ML IJ SOLN
0.0000 [IU] | Freq: Three times a day (TID) | INTRAMUSCULAR | Status: DC
Start: 2020-12-23 — End: 2020-12-23

## 2020-12-23 MED ORDER — MUPIROCIN 2 % EX OINT
TOPICAL_OINTMENT | Freq: Two times a day (BID) | CUTANEOUS | Status: DC
  Filled 2020-12-23: qty 22

## 2020-12-23 MED ORDER — LIDOCAINE 5 % EX PTCH
1.0000 | MEDICATED_PATCH | CUTANEOUS | Status: DC
  Administered 2020-12-23 – 2020-12-25 (×3): 1 via TRANSDERMAL
  Filled 2020-12-23 (×3): qty 1

## 2020-12-23 MED ORDER — ENSURE MAX PROTEIN PO LIQD
11.0000 [oz_av] | Freq: Every day | ORAL | Status: DC
Start: 2020-12-23 — End: 2020-12-25
  Administered 2020-12-23: 11 [oz_av] via ORAL
  Filled 2020-12-23: qty 330

## 2020-12-23 MED ORDER — INSULIN GLARGINE-YFGN 100 UNIT/ML ~~LOC~~ SOLN
8.0000 [IU] | Freq: Every day | SUBCUTANEOUS | Status: DC
  Administered 2020-12-23 – 2020-12-25 (×3): 8 [IU] via SUBCUTANEOUS
  Filled 2020-12-23 (×3): qty 0.08

## 2020-12-23 MED ORDER — INSULIN ASPART 100 UNIT/ML IJ SOLN
0.0000 [IU] | Freq: Three times a day (TID) | INTRAMUSCULAR | Status: DC
  Administered 2020-12-23: 3 [IU] via SUBCUTANEOUS

## 2020-12-23 NOTE — Consult Note (Signed)
NAME: Suzanne Snyder  DOB: [birth year]  MRN: [id number]  Date/Time: 12/23/2020 11:36 AM  REQUESTING PROVIDER: Dr.Pokhrel Subjective:  REASON FOR CONSULT: Rt wrist ? Suzanne Snyder is a 70 y.o. female with a history of DM on insulin ( pump) COPD, current smoker, Diabetic neuropathy, charcot foot rt and left THA , left shoulder replacement presents with rt wrist swelling and pain Pt was recently in the ED on 12/10/2020 for right thigh abscess/ cellulitis and underwent I/D and given a dose of Dalbavancin and sent home- I saw her in my office on 12/17/20 and the rt thigh was doing much better The swelling and erythema had improved Culture was positive for MRSA- I did not think she needed any further antibiotics In May of this year she was in the ED complaining of a wound on the face which she thought was due to a spider bite while sleeping.  She was treated with Keflex and Bactrim for facial cellulitis.  No culture was done in the ED.  Apparently she has had a few lesions before. So there was a concern for MRSA colonization and I sent nares swab tht came back negative  Pt has been having rt shoulder pain for many months now and the xray done on 08/06/20 showed Chondrocalcinosis of the humeral articular cartilage. She says she had rt wrist pain as well for many weeks and the swelling started only recently that prompted her to ome to the ED. No fever or chills No trauma  Past Medical History:  Diagnosis Date   Anxiety    Arthritis    Asthma    Diabetes mellitus without complication (HCC)    Insulin pump in place    Osteoporosis    RLS (restless legs syndrome)    Smokers' cough ([place])    Wears dentures    full upper and lower    Past Surgical History:  Procedure Laterality Date   CATARACT EXTRACTION W/PHACO Right 02/20/2020   Procedure: CATARACT EXTRACTION PHACO AND INTRAOCULAR LENS PLACEMENT ([place]) RIGHT 5.60 00:36.3;  Surgeon: Birder Robson, MD;  Location: [facility];  Service:  Ophthalmology;  Laterality: Right;  Diabetic - insulin pump Requests not first   CATARACT EXTRACTION W/PHACO Left 03/05/2020   Procedure: CATARACT EXTRACTION PHACO AND INTRAOCULAR LENS PLACEMENT ([facility]) LEFT 4.94 00:31.6;  Surgeon: Birder Robson, MD;  Location: [facility];  Service: Ophthalmology;  Laterality: Left;   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   ROTATOR CUFF REPAIR Left 2014 and 2015   SPINE SURGERY N/A 1984   L4-5, not sure of procedure   THYROID SURGERY      Social History   Socioeconomic History   Marital status: Married    Spouse name: Not on file   Number of children: Not on file   Years of education: Not on file   Highest education level: Not on file  Occupational History   Not on file  Tobacco Use   Smoking status: Every Day    Packs/day: 1.00    Years: 50.00    Pack years: 50.00    Types: Cigarettes   Smokeless tobacco: Never   Tobacco comments:    started smoking age 54  Vaping Use   Vaping Use: Former   Devices: "tried itArchivist and Sexual Activity   Alcohol use: No   Drug use: No   Sexual activity: Not on file  Other Topics Concern   Not on file  Social History Narrative  Not on file   Social Determinants of Health   Financial Resource Strain: Not on file  Food Insecurity: Not on file  Transportation Needs: Not on file  Physical Activity: Not on file  Stress: Not on file  Social Connections: Not on file  Intimate Partner Violence: Not on file    Family History  Problem Relation Age of Onset   Parkinsonism Father    Allergies  Allergen Reactions   Pregabalin Anaphylaxis   Erythromycin Other (See Comments)    Severe stomach upset   I? Current Facility-Administered Medications  Medication Dose Route Frequency Provider Last Rate Last Admin   0.9 %  sodium chloride infusion   Intravenous Continuous Mansy, Jan A, MD 100 mL/hr at 12/23/20 0240 New Bag at 12/23/20 0240   acetaminophen (TYLENOL) tablet 650 mg  650 mg  Oral Q6H PRN Mansy, Jan A, MD       Or   acetaminophen (TYLENOL) suppository 650 mg  650 mg Rectal Q6H PRN Mansy, Jan A, MD       albuterol (PROVENTIL) (2.5 MG/3ML) 0.083% nebulizer solution 2.5 mg  2.5 mg Nebulization Q6H PRN Mansy, Jan A, MD   2.5 mg at 12/22/20 2240   aspirin EC tablet 81 mg  81 mg Oral Daily Mansy, Jan A, MD   81 mg at 12/23/20 [id number]   celecoxib (CELEBREX) capsule 100 mg  100 mg Oral BID Mansy, Jan A, MD   100 mg at 12/23/20 [id number]   clonazePAM (KLONOPIN) tablet 0.5 mg  0.5 mg Oral BID PRN Mansy, Jan A, MD       cyclobenzaprine (FLEXERIL) tablet 10 mg  10 mg Oral TID PRN Mansy, Jan A, MD   10 mg at 12/21/20 2006   diphenhydrAMINE (BENADRYL) tablet 50 mg  50 mg Oral PRN Mansy, Jan A, MD       enoxaparin (LOVENOX) injection 40 mg  40 mg Subcutaneous Q24H Mansy, Jan A, MD   40 mg at 12/22/20 2235   gabapentin (NEURONTIN) capsule 300 mg  300 mg Oral QID Mansy, Jan A, MD   300 mg at 12/23/20 [id number]   gadobutrol (GADAVIST) 1 MMOL/ML injection 5 mL  5 mL Intravenous Once PRN Delman Kitten, MD       hydrOXYzine (ATARAX) tablet 10 mg  10 mg Oral TID PRN Mansy, Jan A, MD       insulin aspart (novoLOG) injection 0-9 Units  0-9 Units Subcutaneous TID WC Benita Gutter, RPH   3 Units at 12/23/20 0940   ipratropium (ATROVENT) nebulizer solution 0.5 mg  0.5 mg Nebulization Q6H PRN Mansy, Jan A, MD       magnesium hydroxide (MILK OF MAGNESIA) suspension 30 mL  30 mL Oral Daily PRN Mansy, Jan A, MD       MEDLINE mouth rinse  15 mL Mouth Rinse BID Mansy, Jan A, MD   15 mL at 12/22/20 2240   morphine 2 MG/ML injection 2 mg  2 mg Intravenous Q2H PRN Mansy, Jan A, MD   2 mg at 12/23/20 0615   multivitamin with minerals tablet   Oral BID Mansy, Jan A, MD   1 tablet at 12/23/20 [id number]   omega-3 acid ethyl esters (LOVAZA) capsule 1 g  1 g Oral BID Mansy, Jan A, MD   1 g at 12/23/20 [id number]   rOPINIRole (REQUIP) tablet 1.5 mg  1.5 mg Oral QHS Mansy, Jan A, MD   1.5 mg at 12/22/20 2236   traMADol Veatrice Bourbon)  tablet 50 mg  50 mg Oral Q6H PRN Pokhrel, Laxman, MD   50 mg at 12/22/20 2236   traZODone (DESYREL) tablet 25 mg  25 mg Oral QHS PRN Mansy, Jan A, MD       triamcinolone cream (KENALOG) 0.1 % cream   Topical BID Mansy, Jan A, MD       vancomycin (VANCOREADY) IVPB 750 mg/150 mL  750 mg Intravenous Q24H Rito Ehrlich A, RPH 150 mL/hr at 12/23/20 0611 750 mg at 12/23/20 [id number]   Vitamin D (Ergocalciferol) (DRISDOL) capsule 50,000 Units  50,000 Units Oral Once per day on Mon Thu Mansy, Jan A, MD   50,000 Units at 12/23/20 [id number]     Abtx:  Anti-infectives (From admission, onward)    Start     Dose/Rate Route Frequency Ordered Stop   12/23/20 0600  vancomycin (VANCOREADY) IVPB 750 mg/150 mL        750 mg 150 mL/hr over 60 Minutes Intravenous Every 24 hours 12/22/20 0946     12/22/20 0600  vancomycin (VANCOCIN) IVPB 1000 mg/200 mL premix  Status:  Discontinued        1,000 mg 200 mL/hr over 60 Minutes Intravenous Every 24 hours 12/21/20 1952 12/22/20 0946   12/22/20 0500  ceFEPIme (MAXIPIME) 2 g in sodium chloride 0.9 % 100 mL IVPB  Status:  Discontinued        2 g 200 mL/hr over 30 Minutes Intravenous Every 12 hours 12/21/20 1952 12/22/20 0905   12/21/20 1600  vancomycin (VANCOCIN) IVPB 1000 mg/200 mL premix  Status:  Discontinued        1,000 mg 200 mL/hr over 60 Minutes Intravenous  Once 12/21/20 1550 12/21/20 1557   12/21/20 1600  ceFEPIme (MAXIPIME) 2 g in sodium chloride 0.9 % 100 mL IVPB  Status:  Discontinued        2 g 200 mL/hr over 30 Minutes Intravenous  Once 12/21/20 1550 12/21/20 1558   12/21/20 1545  ceFEPIme (MAXIPIME) 2 g in sodium chloride 0.9 % 100 mL IVPB        2 g 200 mL/hr over 30 Minutes Intravenous  Once 12/21/20 1543 12/21/20 1727   12/21/20 1545  vancomycin (VANCOREADY) IVPB 1250 mg/250 mL        1,250 mg 166.7 mL/hr over 90 Minutes Intravenous  Once 12/21/20 1543 12/21/20 1954       REVIEW OF SYSTEMS:  Const: negative fever, negative chills, negative weight  loss Eyes: negative diplopia or visual changes, negative eye pain ENT: painful boil left nostril Resp: negative cough, hemoptysis, dyspnea Cards: negative for chest pain, palpitations, lower extremity edema GU: negative for frequency, dysuria and hematuria GI: Negative for abdominal pain, diarrhea, bleeding, constipation Skin: negative for rash and pruritus Heme: negative for easy bruising and gum/nose bleeding MS:pin rt shoulder, rt wrist, swelling rt wrist Neurolo:negative for headaches, dizziness, vertigo, memory problems  Psych: anxiety, depression  Endocrine: diabetes Allergy/Immunology-as above Objective:  VITALS:  BP (!) 144/58 (BP Location: Left Arm)   Pulse (!) 59   Temp 99 F (37.2 C)   Resp 15   Ht 5\' 9"  (1.753 m)   Wt 50.1 kg   SpO2 96%   BMI 16.31 kg/m  PHYSICAL EXAM:  General: Alert, cooperative, in some  distress, pale Head: Normocephalic, without obvious abnormality, atraumatic. Eyes: Conjunctivae clear, anicteric sclerae. Pupils are equal ENT Nares normal. No drainage or sinus tenderness. Lips, mucosa, and tongue normal. No Thrush Neck: , symmetrical, no adenopathy, thyroid: non tender  no carotid bruit and no JVD. Back: No CVA tenderness. Lungs: Clear to auscultation bilaterally. No Wheezing or Rhonchi. No rales. Heart: Regular rate and rhythm, no murmur, rub or gallop. Abdomen: Soft, non-tender,not distended. Bowel sounds normal. No masses Extremities: rt wrist and hand dorsum and fingers swollen- minimal erythema, tender on movt Skin: rt thigh I/D site - very small- no surrounding erythema Lymph: Cervical, supraclavicular normal. Neurologic: Grossly non-focal Pertinent Labs Lab Results CBC    Component Value Date/Time   WBC 7.3 12/23/2020 0255   RBC 3.41 (L) 12/23/2020 0255   HGB 10.5 (L) 12/23/2020 0255   HCT 32.2 (L) 12/23/2020 0255   PLT 239 12/23/2020 0255   MCV 94.4 12/23/2020 0255   MCH 30.8 12/23/2020 0255   MCHC 32.6 12/23/2020 0255    RDW 13.9 12/23/2020 0255   LYMPHSABS 1.9 12/21/2020 0850   MONOABS 1.0 12/21/2020 0850   EOSABS 0.2 12/21/2020 0850   BASOSABS 0.1 12/21/2020 0850    CMP Latest Ref Rng & Units 12/23/2020 12/22/2020 12/21/2020  Glucose 70 - 99 mg/dL 207(H) 187(H) 175(H)  BUN 8 - 23 mg/dL 15 15 11   Creatinine 0.44 - 1.00 mg/dL 0.63 0.65 0.67  Sodium 135 - 145 mmol/L 134(L) 133(L) 136  Potassium 3.5 - 5.1 mmol/L 3.8 4.5 3.9  Chloride 98 - 111 mmol/L 106 104 104  CO2 22 - 32 mmol/L 20(L) 18(L) 23  Calcium 8.9 - 10.3 mg/dL 7.3(L) 7.6(L) 8.6(L)  Total Protein 6.5 - 8.1 g/dL - - 7.5  Total Bilirubin 0.3 - 1.2 mg/dL - - 0.9  Alkaline Phos 38 - 126 U/L - - 65  AST 15 - 41 U/L - - 16  ALT 0 - 44 U/L - - 15      Microbiology: Recent Results (from the past 240 hour(s))  MRSA Next Gen by PCR, Nasal     Status: None   Collection Time: 12/17/20  1:30 PM   Specimen: Nasal Mucosa; Nasal Swab  Result Value Ref Range Status   MRSA by PCR Next Gen NOT DETECTED NOT DETECTED Final    Comment: (NOTE) The GeneXpert MRSA Assay (FDA approved for NASAL specimens only), is one component of a comprehensive MRSA colonization surveillance program. It is not intended to diagnose MRSA infection nor to guide or monitor treatment for MRSA infections. Test performance is not FDA approved in patients less than 38 years old. Performed at [facility], [address] [place], [place] [address]   Culture, blood (Routine X 2) w Reflex to ID Panel     Status: None (Preliminary result)   Collection Time: 12/21/20 11:09 AM   Specimen: BLOOD  Result Value Ref Range Status   Specimen Description BLOOD LEFT UPPER ARM  Final   Special Requests   Final    BOTTLES DRAWN AEROBIC AND ANAEROBIC Blood Culture results may not be optimal due to an inadequate volume of blood received in culture bottles   Culture   Final    NO GROWTH 2 DAYS Performed at [facility], [address] [place], [place] [address]     Report Status PENDING  Incomplete  Culture, blood (Routine X 2) w Reflex to ID Panel     Status: None (Preliminary result)   Collection Time: 12/21/20 11:11 AM   Specimen: BLOOD  Result Value Ref Range Status   Specimen Description BLOOD BLOOD RIGHT FOREARM  Final   Special Requests   Final    BOTTLES DRAWN AEROBIC AND ANAEROBIC Blood Culture  adequate volume   Culture   Final    NO GROWTH 2 DAYS Performed at [facility], [address] [place], [place] [address]    Report Status PENDING  Incomplete  Resp Panel by RT-PCR (Flu A&B, Covid) Nasopharyngeal Swab     Status: None   Collection Time: 12/21/20  3:49 PM   Specimen: Nasopharyngeal Swab; Nasopharyngeal(NP) swabs in vial transport medium  Result Value Ref Range Status   SARS Coronavirus 2 by RT PCR NEGATIVE NEGATIVE Final    Comment: (NOTE) SARS-CoV-2 target nucleic acids are NOT DETECTED.  The SARS-CoV-2 RNA is generally detectable in upper respiratory specimens during the acute phase of infection. The lowest concentration of SARS-CoV-2 viral copies this assay can detect is 138 copies/mL. A negative result does not preclude SARS-Cov-2 infection and should not be used as the sole basis for treatment or other patient management decisions. A negative result may occur with  improper specimen collection/handling, submission of specimen other than nasopharyngeal swab, presence of viral mutation(s) within the areas targeted by this assay, and inadequate number of viral copies(<138 copies/mL). A negative result must be combined with clinical observations, patient history, and epidemiological information. The expected result is Negative.  Fact Sheet for Patients:  EntrepreneurPulse.com.au  Fact Sheet for Healthcare Providers:  IncredibleEmployment.be  This test is no t yet approved or cleared by the [place] FDA and  has been authorized for detection and/or diagnosis of  SARS-CoV-2 by FDA under an Emergency Use Authorization (EUA). This EUA will remain  in effect (meaning this test can be used) for the duration of the COVID-19 declaration under Section 564(b)(1) of the Act, 21 U.S.C.section 360bbb-3(b)(1), unless the authorization is terminated  or revoked sooner.       Influenza A by PCR NEGATIVE NEGATIVE Final   Influenza B by PCR NEGATIVE NEGATIVE Final    Comment: (NOTE) The Xpert Xpress SARS-CoV-2/FLU/RSV plus assay is intended as an aid in the diagnosis of influenza from Nasopharyngeal swab specimens and should not be used as a sole basis for treatment. Nasal washings and aspirates are unacceptable for Xpert Xpress SARS-CoV-2/FLU/RSV testing.  Fact Sheet for Patients: EntrepreneurPulse.com.au  Fact Sheet for Healthcare Providers: IncredibleEmployment.be  This test is not yet approved or cleared by the [place] FDA and has been authorized for detection and/or diagnosis of SARS-CoV-2 by FDA under an Emergency Use Authorization (EUA). This EUA will remain in effect (meaning this test can be used) for the duration of the COVID-19 declaration under Section 564(b)(1) of the Act, 21 U.S.C. section 360bbb-3(b)(1), unless the authorization is terminated or revoked.  Performed at [facility], [address] [place], [place] [address]     IMAGING RESULTS:  I have personally reviewed the films ?rt wrist- There is nonspecific calcification of the TFCC.   Impression/Recommendation ? ?70 yr female presenting with pain rt wirst for many weeks and swelling for the past week or so Also has rt shoulder pain for many months  Rt wrist swelling- tenosynovitis /soft tissue infection VS inflammatory arthritis- Infection VS  chondrocalcinosis- CPPD   Seen by ortho who does not suspect arthritis Would recommend Rheumatology consult for r/o CPPD   Recent MRSA skin and soft tissue infection- underwent  I/D on 12/10/20 and was given 1 dose of Dalbavancin ( a long acting anti MRSA agent ---never had bacteremia Now being treated like MRSA synovitis/arthritis On vanco  DM-was on insulin pump-now on sliding scale and semglee Rt foot charcot deformity  Rt  shoulder pain restricted mobility- ? chondrocalcinosis ? ___________________________________________________ Discussed with patient and care team Note:  This document was prepared using Dragon voice recognition software and may include unintentional dictation errors.

## 2020-12-23 NOTE — Progress Notes (Signed)
Initial Nutrition Assessment  DOCUMENTATION CODES:   Underweight  INTERVENTION:   -MVI with minerals daily -Glucerna Shake po BID, each supplement provides 220 kcal and 10 grams of protein  -Ensure Max po daily, each supplement provides 150 kcal and 30 grams of protein  NUTRITION DIAGNOSIS:   Inadequate oral intake related to decreased appetite as evidenced by meal completion < 50%.  GOAL:   Patient will meet greater than or equal to 90% of their needs  MONITOR:   PO intake, Supplement acceptance, Labs, Weight trends, Skin, I & O's  REASON FOR ASSESSMENT:   Malnutrition Screening Tool    ASSESSMENT:   Suzanne Snyder is a 70 y.o. female with medical history significant for asthma, arthritis, anxiety, type 2 diabetes mellitus, osteoporosis and restless leg syndrome coming with acute onset of right forearm and wrist pain with swelling.  She reports no falls or injuries.  She denies any fever or chills.  No nausea or vomiting or abdominal pain.  She was recently admitted for right thigh purulent cellulitis and had I&D and was discharged on 11/30.  No chest pain or dyspnea or cough or wheezing.  No dysuria, oliguria or hematuria or flank pain.  Pt admitted with cellulitis of rt wrist and distal forearm.   Reviewed I/O's: +2.7 L x 24 hours and +4 L since admission  UOP: 700 ml x 24 hours   Spoke with pt at bedside, who reports feeling sleepy today. She reports fair appetite, consuming about 50-75% of breakfast meal today. Noted meal completions 25-50%.   Per pt, she consumes only one meal per day PTA around 12-2AM ("it's the only time I can get some peace").   Pt denies any weight loss. Reviewed wt hx; wt has been stable over the past 10 months.   Discussed importance of good meal and supplement intake to promote healing. Pt reports some depletions in her arm secondary to multiple shoulder surgeries. She is small framed.   Medications reviewed and include vitamin D, lovasa,  and 0.9% sodium chloride infusion @ 100 ml/hr.   Lab Results  Component Value Date   HGBA1C 6.5 (H) 12/21/2020   PTA DM medications are 4 units insulin aspart TID with meals and at bedtime.   Labs reviewed: Na: 134, CBGS: 114-270 (inpatient orders for glycemic control are 0-9 units insulin aspart TID with meals).    NUTRITION - FOCUSED PHYSICAL EXAM:  Flowsheet Row Most Recent Value  Orbital Region No depletion  Upper Arm Region Mild depletion  Thoracic and Lumbar Region No depletion  Buccal Region No depletion  Temple Region Mild depletion  Clavicle Bone Region Moderate depletion  Clavicle and Acromion Bone Region No depletion  Scapular Bone Region No depletion  Dorsal Hand Mild depletion  Patellar Region Moderate depletion  Anterior Thigh Region Moderate depletion  Posterior Calf Region Moderate depletion  Edema (RD Assessment) None  Hair Reviewed  Eyes Reviewed  Mouth Reviewed  Skin Reviewed  Nails Reviewed       Diet Order:   Diet Order             Diet Carb Modified Fluid consistency: Thin; Room service appropriate? Yes  Diet effective now                   EDUCATION NEEDS:   Education needs have been addressed  Skin:  Skin Assessment: Skin Integrity Issues: Skin Integrity Issues:: Other (Comment) Other: non-pressure wount to rt thigh  Last BM:  12/22/20  Height:   Ht Readings from Last 1 Encounters:  12/21/20 5\' 9"  (1.753 m)    Weight:   Wt Readings from Last 1 Encounters:  12/21/20 50.1 kg    Ideal Body Weight:  65.9 kg  BMI:  Body mass index is 16.31 kg/m.  Estimated Nutritional Needs:   Kcal:  1800-2000  Protein:  100-115 grams  Fluid:  > 1.8 L    14/10/22, RD, LDN, CDCES Registered Dietitian II Certified Diabetes Care and Education Specialist Please refer to [facility] for RD and/or RD on-call/weekend/after hours pager

## 2020-12-23 NOTE — TOC Initial Note (Signed)
Transition of Care [facility]) - Initial/Assessment Note    Patient Details  Name: MICALAH CABEZAS MRN: [id number] Date of Birth: 1950/07/02  Transition of [facility]) CM/SW Contact:    Marlowe Sax, RN Phone Number: 12/23/2020, 12:17 PM  Clinical Narrative:         Transition of Care [facility]) Screening Note   Patient Details  Name: PAISLEY GRAJEDA Date of Birth: 08-Dec-1950   [facility]) CM/SW Contact:    Marlowe Sax, RN Phone Number: 12/23/2020, 12:17 PM    Transition of Care Department [facility]) has reviewed patient and no TOC needs have been identified at this time. We will continue to monitor patient advancement through interdisciplinary progression rounds. If new patient transition needs arise, please place a TOC consult.                   Patient Goals and CMS Choice        Expected Discharge Plan and Services                                                Prior Living Arrangements/Services                       Activities of Daily Living Home Assistive Devices/Equipment: None ADL Screening (condition at time of admission) Patient's cognitive ability adequate to safely complete daily activities?: Yes Is the patient deaf or have difficulty hearing?: No Does the patient have difficulty seeing, even when wearing glasses/contacts?: No Does the patient have difficulty concentrating, remembering, or making decisions?: No Patient able to express need for assistance with ADLs?: Yes Does the patient have difficulty dressing or bathing?: No Independently performs ADLs?: No Does the patient have difficulty walking or climbing stairs?: Yes Weakness of Legs: Both Weakness of Arms/Hands: Right  Permission Sought/Granted                  Emotional Assessment              Admission diagnosis:  History of MRSA infection [Z86.14] Acute pain of right wrist [M25.531] Cellulitis of right wrist [L03.113] Patient Active  Problem List   Diagnosis Date Noted   Cellulitis of right wrist 12/21/2020   Cellulitis of right thigh 12/11/2020   Type 1 diabetes mellitus with hypoglycemia without coma (HCC) 12/11/2020   Hypoglycemia 12/10/2020   PCP:  Katrinka Blazing, MD Pharmacy:   [id number] - [place], [place] - [address] [facility] [address] [place] [address] Phone: [phone] Fax: [phone]  [facility] - [place], [place] - [address] [place] [address] Phone: [phone] Fax: [phone]     Social Determinants of Health (SDOH) Interventions    Readmission Risk Interventions No flowsheet data found.

## 2020-12-23 NOTE — Consult Note (Signed)
WOC Nurse wound consult note Consultation was completed by review of records, images and assistance from the bedside nurse/clinical staff.  Reason for Consult: right thigh wound Wound type: infectious-MRSA abscess in Nov. 2022 Pressure Injury POA: NA Measurement: see nursing flow sheet Wound bed: previous image, hard to see base, but pink and moist  Drainage (amount, consistency, odor)minimal, no odor  Periwound; intact  Dressing procedure/placement/frequency: Insert 1/4 inch Iodoform strip [place] [phone]) into the right thigh wound. Leave a piece hanging out for easy removal. Cover with a small foam dressing or dry gauze. Change daily.   Thank you for the consult. WOC nurse will not follow at this time.  Please re-consult the WOC team if needed.  [facility], CNS, [employer] [phone]

## 2020-12-23 NOTE — Progress Notes (Signed)
Patient reports R wrist pain is similar.   Exam mostly unchanged. Still able to passively range wrist through ~70-80 degree arc of motion.   Recommend continued nonoperative management with IV Abx and elevation of hand. Would not aspirate at this time given clinical exam and erythema overlying dorsal wrist to avoid seeding the joint.

## 2020-12-23 NOTE — Progress Notes (Signed)
PROGRESS NOTE  Suzanne Snyder:[id number] DOB: 09-Mar-1950 DOA: 12/21/2020 PCP: Katrinka Blazing, MD   LOS: 2 days   Brief narrative:  Suzanne Snyder is a 70 y.o. female with past medical history of asthma, arthritis, anxiety, type 2 diabetes, osteoporosis, restless leg syndrome presented to hospital with wrist pain and swelling.  Of note patient was recently admitted to hospital for right-sided thigh purulent cellulitis and had I&D done and was discharged on 12/11/2020.  In the ED, respiratory rate was slightly elevated.  Blood glucose level was elevated at 175.  CBC within normal limits.  Sed rate was 42.  Blood cultures were obtained.  X-ray showed no evidence of bony abnormality.  MRI of the right wrist was performed to rule out septic arthritis which showed tenosynovitis involving the flexor tendons, joint effusion/septic arthritis not excluded.   Orthopedics was notified.Patient was given IV vancomycin and antibiotic and was admitted to hospital.    Assessment/Plan:  Principal Problem:   Cellulitis of right wrist  Cellulitis of the right wrist and distal forearm with tenosynovitis of the flexor tendons. Currently on IV vancomycin.  Patient has been seen by infectious disease.    Continue supportive care including pain management.  Patient does have a history of right thigh purulent cellulitis recently.  Blood cultures negative in 2 days.  Leukocytosis has improved at this time..  Orthopedics on board and low suspicion for septic arthritis. .  Right upper extremity duplex been ordered is pending.    Type 1 diabetes mellitus with peripheral neuropathy. Patient uses insulin pump at home and sees Dr. Monia Sabal with [facility] in the for diabetes management.  Currently insulin pump is off.  Never recorded on board.  Will start patient on Semglee 8 units.  Continue sliding scale insulin Accu-Cheks diabetic diet.  Hemoglobin A1c from 12/21/2020 at 6.5.  POC glucose of 270.   Asthma,  controlled. Continue albuterol, Singulair.   Restless leg syndrome. Continue Requip.   Peripheral neuropathy. Continue Neurontin.  Anxiety. Continue Klonopin, hydroxyzine.  Chronic shoulder pain, neck pain.  Patient supposed to follow-up with the pain management clinic as outpatient.  On tramadol and morphine at this time.  On Neurontin as well.  Recent right thigh wound.   Wound care has been consulted for dressing.  DVT prophylaxis: enoxaparin (LOVENOX) injection 40 mg Start: 12/21/20 2200   Code Status: Full code  Family Communication:  Spoke with the patient at bedside  Status is: Inpatient  Remains inpatient appropriate because: Need for IV antibiotics, orthopedics and ID on board.  Consultants: Orthopedics Infectious disease  Procedures: None  Anti-infectives:  Vancomycin IV  Anti-infectives (From admission, onward)    Start     Dose/Rate Route Frequency Ordered Stop   12/23/20 0600  vancomycin (VANCOREADY) IVPB 750 mg/150 mL        750 mg 150 mL/hr over 60 Minutes Intravenous Every 24 hours 12/22/20 0946     12/22/20 0600  vancomycin (VANCOCIN) IVPB 1000 mg/200 mL premix  Status:  Discontinued        1,000 mg 200 mL/hr over 60 Minutes Intravenous Every 24 hours 12/21/20 1952 12/22/20 0946   12/22/20 0500  ceFEPIme (MAXIPIME) 2 g in sodium chloride 0.9 % 100 mL IVPB  Status:  Discontinued        2 g 200 mL/hr over 30 Minutes Intravenous Every 12 hours 12/21/20 1952 12/22/20 0905   12/21/20 1600  vancomycin (VANCOCIN) IVPB 1000 mg/200 mL premix  Status:  Discontinued  1,000 mg 200 mL/hr over 60 Minutes Intravenous  Once 12/21/20 1550 12/21/20 1557   12/21/20 1600  ceFEPIme (MAXIPIME) 2 g in sodium chloride 0.9 % 100 mL IVPB  Status:  Discontinued        2 g 200 mL/hr over 30 Minutes Intravenous  Once 12/21/20 1550 12/21/20 1558   12/21/20 1545  ceFEPIme (MAXIPIME) 2 g in sodium chloride 0.9 % 100 mL IVPB        2 g 200 mL/hr over 30 Minutes  Intravenous  Once 12/21/20 1543 12/21/20 1727   12/21/20 1545  vancomycin (VANCOREADY) IVPB 1250 mg/250 mL        1,250 mg 166.7 mL/hr over 90 Minutes Intravenous  Once 12/21/20 1543 12/21/20 1954       Subjective: Today, patient was seen and examined at bedside.  Complains of the shoulder pain and wrist pain.  Pain.  Had extensive history of left shoulder surgery.  Feels frustrated from her pain  Objective: Vitals:   12/22/20 2241 12/23/20 0402  BP:  (!) 118/52  Pulse:  64  Resp:  15  Temp:  97.8 F (36.6 C)  SpO2: 96% 95%    Intake/Output Summary (Last 24 hours) at 12/23/2020 0726 Last data filed at 12/23/2020 0300 Gross per 24 hour  Intake 2689.46 ml  Output --  Net 2689.46 ml    Filed Weights   12/21/20 0841 12/21/20 1958  Weight: 54 kg 50.1 kg   Body mass index is 16.31 kg/m.   Physical Exam:  GENERAL: Patient is alert awake and oriented. Not in obvious distress.  Thinly built.   HENT: No scleral pallor or icterus. Pupils equally reactive to light. Oral mucosa is moist NECK: is supple, no gross swelling noted. CHEST: Clear to auscultation. No crackles or wheezes.  Diminished breath sounds bilaterally. CVS: S1 and S2 heard, no murmur. Regular rate and rhythm.  ABDOMEN: Soft, non-tender, bowel sounds are present. EXTREMITIES: Erythema and redness of the right wrist and distal forearm, improved range of movement.  Right thigh with dressing. CNS: Cranial nerves are intact. No focal motor deficits. SKIN: Right forearm with erythema   Data Review: I have personally reviewed the following laboratory data and studies,  CBC: Recent Labs  Lab 12/21/20 0850 12/22/20 0506 12/23/20 0255  WBC 10.5 11.2* 7.3  NEUTROABS 7.4  --   --   HGB 13.7 12.1 10.5*  HCT 41.8 36.7 32.2*  MCV 95.9 94.8 94.4  PLT 271 153 239    Basic Metabolic Panel: Recent Labs  Lab 12/21/20 0850 12/22/20 0506 12/23/20 0255  NA 136 133* 134*  K 3.9 4.5 3.8  CL 104 104 106  CO2 23  18* 20*  GLUCOSE 175* 187* 207*  BUN 11 15 15   CREATININE 0.67 0.65 0.63  CALCIUM 8.6* 7.6* 7.3*  MG  --   --  2.0    Liver Function Tests: Recent Labs  Lab 12/21/20 0850  AST 16  ALT 15  ALKPHOS 65  BILITOT 0.9  PROT 7.5  ALBUMIN 4.1    No results for input(s): LIPASE, AMYLASE in the last 168 hours. No results for input(s): AMMONIA in the last 168 hours. Cardiac Enzymes: No results for input(s): CKTOTAL, CKMB, CKMBINDEX, TROPONINI in the last 168 hours. BNP (last 3 results) No results for input(s): BNP in the last 8760 hours.  ProBNP (last 3 results) No results for input(s): PROBNP in the last 8760 hours.  CBG: Recent Labs  Lab 12/22/20 0300 12/22/20 0827  12/22/20 1229 12/22/20 1606 12/22/20 2142  GLUCAP 158* 242* 196* 157* 233*    Recent Results (from the past 240 hour(s))  MRSA Next Gen by PCR, Nasal     Status: None   Collection Time: 12/17/20  1:30 PM   Specimen: Nasal Mucosa; Nasal Swab  Result Value Ref Range Status   MRSA by PCR Next Gen NOT DETECTED NOT DETECTED Final    Comment: (NOTE) The GeneXpert MRSA Assay (FDA approved for NASAL specimens only), is one component of a comprehensive MRSA colonization surveillance program. It is not intended to diagnose MRSA infection nor to guide or monitor treatment for MRSA infections. Test performance is not FDA approved in patients less than 70 years old. Performed at [facility], [address] [place], [place] [address]   Culture, blood (Routine X 2) w Reflex to ID Panel     Status: None (Preliminary result)   Collection Time: 12/21/20 11:09 AM   Specimen: BLOOD  Result Value Ref Range Status   Specimen Description BLOOD LEFT UPPER ARM  Final   Special Requests   Final    BOTTLES DRAWN AEROBIC AND ANAEROBIC Blood Culture results may not be optimal due to an inadequate volume of blood received in culture bottles   Culture   Final    NO GROWTH 2 DAYS Performed at [facility], [address] [place], [place] [address]    Report Status PENDING  Incomplete  Culture, blood (Routine X 2) w Reflex to ID Panel     Status: None (Preliminary result)   Collection Time: 12/21/20 11:11 AM   Specimen: BLOOD  Result Value Ref Range Status   Specimen Description BLOOD BLOOD RIGHT FOREARM  Final   Special Requests   Final    BOTTLES DRAWN AEROBIC AND ANAEROBIC Blood Culture adequate volume   Culture   Final    NO GROWTH 2 DAYS Performed at [facility], [address], [place], [place] [address]    Report Status PENDING  Incomplete  Resp Panel by RT-PCR (Flu A&B, Covid) Nasopharyngeal Swab     Status: None   Collection Time: 12/21/20  3:49 PM   Specimen: Nasopharyngeal Swab; Nasopharyngeal(NP) swabs in vial transport medium  Result Value Ref Range Status   SARS Coronavirus 2 by RT PCR NEGATIVE NEGATIVE Final    Comment: (NOTE) SARS-CoV-2 target nucleic acids are NOT DETECTED.  The SARS-CoV-2 RNA is generally detectable in upper respiratory specimens during the acute phase of infection. The lowest concentration of SARS-CoV-2 viral copies this assay can detect is 138 copies/mL. A negative result does not preclude SARS-Cov-2 infection and should not be used as the sole basis for treatment or other patient management decisions. A negative result may occur with  improper specimen collection/handling, submission of specimen other than nasopharyngeal swab, presence of viral mutation(s) within the areas targeted by this assay, and inadequate number of viral copies(<138 copies/mL). A negative result must be combined with clinical observations, patient history, and epidemiological information. The expected result is Negative.  Fact Sheet for Patients:  BloggerCourse.com  Fact Sheet for Healthcare Providers:  SeriousBroker.it  This test is no t yet approved or cleared by the [place] FDA and  has been authorized  for detection and/or diagnosis of SARS-CoV-2 by FDA under an Emergency Use Authorization (EUA). This EUA will remain  in effect (meaning this test can be used) for the duration of the COVID-19 declaration under Section 564(b)(1) of the Act, 21 U.S.C.section 360bbb-3(b)(1),  unless the authorization is terminated  or revoked sooner.       Influenza A by PCR NEGATIVE NEGATIVE Final   Influenza B by PCR NEGATIVE NEGATIVE Final    Comment: (NOTE) The Xpert Xpress SARS-CoV-2/FLU/RSV plus assay is intended as an aid in the diagnosis of influenza from Nasopharyngeal swab specimens and should not be used as a sole basis for treatment. Nasal washings and aspirates are unacceptable for Xpert Xpress SARS-CoV-2/FLU/RSV testing.  Fact Sheet for Patients: BloggerCourse.com  Fact Sheet for Healthcare Providers: SeriousBroker.it  This test is not yet approved or cleared by the [place] FDA and has been authorized for detection and/or diagnosis of SARS-CoV-2 by FDA under an Emergency Use Authorization (EUA). This EUA will remain in effect (meaning this test can be used) for the duration of the COVID-19 declaration under Section 564(b)(1) of the Act, 21 U.S.C. section 360bbb-3(b)(1), unless the authorization is terminated or revoked.  Performed at [facility], [address] [place], [place] [address]       Studies: DG Wrist Complete Right  Result Date: 12/21/2020 CLINICAL DATA:  Swelling/pain EXAM: RIGHT WRIST - COMPLETE 3+ VIEW COMPARISON:  None. FINDINGS: Alignment is anatomic. No acute fracture. Joint spaces are preserved. There is nonspecific calcification of the TFCC. IMPRESSION: No acute osseous abnormality. Electronically Signed   By: Guadlupe Spanish M.D.   On: 12/21/2020 09:55   CT HEAD WO CONTRAST ( )  Result Date: 12/22/2020 CLINICAL DATA:  Fall EXAM: CT HEAD WITHOUT CONTRAST TECHNIQUE: Contiguous axial images  were obtained from the base of the skull through the vertex without intravenous contrast. COMPARISON:  CT orbits dated 05/20/2020.  CT head dated 11/24/2010. FINDINGS: Brain: No evidence of acute infarction, hemorrhage, hydrocephalus, extra-axial collection or mass lesion/mass effect. Mild subcortical white matter and periventricular small vessel ischemic changes. Vascular: Mild intracranial atherosclerosis. Skull: Normal. Negative for fracture or focal lesion. Sinuses/Orbits: Partial opacification of the right maxillary sinus. Visualized paranasal sinuses and mastoid air cells are otherwise clear. Other: None. IMPRESSION: No evidence of acute intracranial abnormality. Mild small vessel ischemic changes. Electronically Signed   By: Charline Bills M.D.   On: 12/22/2020 03:20   MR WRIST RIGHT WO CONTRAST  Result Date: 12/21/2020 CLINICAL DATA:  Wrist pain, infection suspected.  No known injury. EXAM: MR OF THE RIGHT WRIST WITHOUT CONTRAST TECHNIQUE: Multiplanar, multisequence MR imaging of the right wrist was performed. No intravenous contrast was administered. COMPARISON:  X-ray 12/21/2020 FINDINGS: Motion degraded coronal and axial T1 and T2 fat saturated sequences were obtained. Patient terminated the exam and no further sequences were able to be obtained. Best possible images submitted for interpretation. Bones/Joint/Cartilage No evidence of acute fracture or dislocation. Mild degenerative changes, most pronounced at the first [facility] joint. Small subchondral cyst or intraosseous ganglion within the hamate. Patchy marrow edema within the capitate and hamate, which may be degenerative. No focal erosion evident. Small radiocarpal and distal radioulnar joint effusions, nonspecific. Ligaments Intrinsic wrist ligaments appear grossly intact. Suspected tear of the articular disc of the TFCC (series 11, image 8). Muscles and Tendons Tenosynovitis within the flexor tendons extending into the visualized aspect of the  distal forearm. Trace tenosynovitis involving the third extensor compartment distal to the level of the distal intersection. Soft tissues Mild soft tissue edema. No organized extra-articular fluid collection is seen. IMPRESSION: 1. Motion degraded, incomplete exam. 2. Tenosynovitis involving the flexor tendons extending into the visualized aspect of the distal forearm. Trace tenosynovitis involving the third extensor compartment. Findings  are nonspecific and could reflect infectious or inflammatory tenosynovitis. 3. Small radiocarpal and distal radioulnar joint effusions, nonspecific. Septic arthritis not excluded. 4. Suspected tear of the articular disc of the TFCC. 5. No acute osseous findings. Electronically Signed   By: Duanne Guess D.O.   On: 12/21/2020 16:50      Joycelyn Das, MD  [facility] 12/23/2020  If 7PM-7AM, please contact night-coverage

## 2020-12-23 NOTE — Progress Notes (Addendum)
Inpatient Diabetes Program Recommendations  AACE/ADA: New Consensus Statement on Inpatient Glycemic Control (2015)  Target Ranges:  Prepandial:   less than 140 mg/dL      Peak postprandial:   less than 180 mg/dL (1-2 hours)      Critically ill patients:  140 - 180 mg/dL    Latest Reference Range & Units 12/22/20 08:27 12/22/20 12:29 12/22/20 16:06 12/22/20 21:42  Glucose-Capillary 70 - 99 mg/dL 242 (H)  3 units Novolog '@1010'  196 (H)  2 units Novolog  157 (H)  2 units Novolog '@1750'  233 (H)  3 units Novolog '@2249'     Latest Reference Range & Units 12/23/20 07:30  Glucose-Capillary 70 - 99 mg/dL 206 (H)    Admit with: Cellulitis of the right wrist and distal forearm   History: Type 1 Diabetes  Home DM Meds: OmniPod Insulin Pump  Current Orders: Novolog Sensitive Correction Scale/ SSI (0-9 units) TID AC + HS    MD- Pt with history of Type 1 diabetes.  Sees Dr. Sheppard Evens with [facility] Endo for diabetes management.  Per ENDO notes and per pt interview, pt uses Insulin Pump at home. Since Insulin Pump is off, pt will need Basal insulin added to regimen to prevent DKA.  Please start Semglee 8 units daily   This is 80% of the total amount of Insulin pt receives on her pump in a 24 hour period for a total basal rate (receives 10.6 units total basal insulin on her pump Q24 hours)  Please start today    Addendum 11am--Met w/ pt at bedside.  Pt A&O and able to tell me she uses an OmniPod insulin pump at home to control CBGs.  Sees Dr. Sheppard Evens with [facility] ENDO.  Told me she thinks her insulin pump was taken home but is not 100% sure where it is at present.  Was not able to tell me her exact pump settings but stated she gets around 10 units total for a basal rate on her pump every 24 hours.  Has had diabetes since her early 70s' and states she has Type 1 diabetes.  I have placed a call to pt's ENDO office at 8:30am today requesting most recent insulin pump settings.  Have not heard back yet as  of 11:30am.  Per Chart Review, pump settings as of March 2022 were as follows:  --Insulin Pump Settings--  Basal Rates: 12am - 0.65 units/hr 3am- 0.45 units/hr 6am- 0.85 units/hr 8am- 0.5 units/hr 12pm- 0.35 units/hr 6pm- 0.15 units/hr  Total Basal Insulin per 24 hours period= 10.6 units  Carbohydrate Ratio:  12am: 1 unit for every 21 Grams of Carbohydrates 5am: 1 unit for every 18.5 Grams of Carbohydrates 10am: 1 unit for every 19.5 Grams of Carbohydrates 1pm: 1 unit for every 20.5 Grams of Carbohydrates  Correction/Sensitivity Factor:  12am: 1 unit for every 90 mg/dl above Target CBG 12pm: 1 unit for every 70 mg/dl above Target CBG 6pm: 1 unit for every 80 mg/dl above Target CBG  Target CBG: 120-130 mg/dl      --Will follow patient during hospitalization--  Wyn Quaker RN, MSN, CDE Diabetes Coordinator Inpatient Glycemic Control Team Team Pager: [phone] (8a-5p)

## 2020-12-24 DIAGNOSIS — M1990 Unspecified osteoarthritis, unspecified site: Secondary | ICD-10-CM

## 2020-12-24 DIAGNOSIS — M1120 Other chondrocalcinosis, unspecified site: Secondary | ICD-10-CM

## 2020-12-24 DIAGNOSIS — Z8614 Personal history of Methicillin resistant Staphylococcus aureus infection: Secondary | ICD-10-CM

## 2020-12-24 DIAGNOSIS — G2581 Restless legs syndrome: Secondary | ICD-10-CM | POA: Diagnosis not present

## 2020-12-24 DIAGNOSIS — J45902 Unspecified asthma with status asthmaticus: Secondary | ICD-10-CM | POA: Diagnosis not present

## 2020-12-24 DIAGNOSIS — F419 Anxiety disorder, unspecified: Secondary | ICD-10-CM | POA: Diagnosis not present

## 2020-12-24 DIAGNOSIS — L03113 Cellulitis of right upper limb: Secondary | ICD-10-CM | POA: Diagnosis not present

## 2020-12-24 LAB — CBC
HCT: 32.6 % — ABNORMAL LOW (ref 36.0–46.0)
Hemoglobin: 10.8 g/dL — ABNORMAL LOW (ref 12.0–15.0)
MCH: 31 pg (ref 26.0–34.0)
MCHC: 33.1 g/dL (ref 30.0–36.0)
MCV: 93.7 fL (ref 80.0–100.0)
Platelets: 234 10*3/uL (ref 150–400)
RBC: 3.48 MIL/uL — ABNORMAL LOW (ref 3.87–5.11)
RDW: 13.8 % (ref 11.5–15.5)
WBC: 6.8 10*3/uL (ref 4.0–10.5)
nRBC: 0 % (ref 0.0–0.2)

## 2020-12-24 LAB — BASIC METABOLIC PANEL
Anion gap: 4 — ABNORMAL LOW (ref 5–15)
BUN: 9 mg/dL (ref 8–23)
CO2: 24 mmol/L (ref 22–32)
Calcium: 7.2 mg/dL — ABNORMAL LOW (ref 8.9–10.3)
Chloride: 106 mmol/L (ref 98–111)
Creatinine, Ser: 0.47 mg/dL (ref 0.44–1.00)
GFR, Estimated: >60 mL/min (ref >60)
Glucose, Bld: 146 mg/dL — ABNORMAL HIGH (ref 70–99)
Potassium: 3.6 mmol/L (ref 3.5–5.1)
Sodium: 134 mmol/L — ABNORMAL LOW (ref 135–145)

## 2020-12-24 LAB — GLUCOSE, CAPILLARY
Glucose-Capillary: 114 mg/dL — ABNORMAL HIGH (ref 70–99)
Glucose-Capillary: 181 mg/dL — ABNORMAL HIGH (ref 70–99)
Glucose-Capillary: 99 mg/dL (ref 70–99)
Glucose-Capillary: 230 mg/dL — ABNORMAL HIGH (ref 70–99)

## 2020-12-24 MED ORDER — DOXYCYCLINE HYCLATE 100 MG PO TABS
100.0000 mg | ORAL_TABLET | Freq: Two times a day (BID) | ORAL | Status: DC
  Administered 2020-12-25: 09:00:00 100 mg via ORAL
  Filled 2020-12-24: qty 1

## 2020-12-24 MED ORDER — PREDNISONE 20 MG PO TABS
40.0000 mg | ORAL_TABLET | Freq: Once | ORAL | Status: AC
  Administered 2020-12-24: 40 mg via ORAL
  Filled 2020-12-24: qty 2

## 2020-12-24 NOTE — Progress Notes (Addendum)
PROGRESS NOTE  Suzanne Snyder CBJ:[id number] DOB: Oct 21, 1950 DOA: 12/21/2020 PCP: Katrinka Blazing, MD   LOS: 3 days   Brief narrative:  Suzanne Snyder is a 70 y.o. female with past medical history of asthma, arthritis, anxiety, type 2 diabetes, osteoporosis, restless leg syndrome presented to hospital with wrist pain and swelling.  Of note, patient was recently admitted to hospital for right-sided thigh purulent cellulitis and had I&D done and was discharged on 12/11/2020.  In the ED, respiratory rate was slightly elevated.  Blood glucose level was elevated at 175.  CBC within normal limits.  Sed rate was 42.  Blood cultures were obtained.  X-ray showed no evidence of bony abnormality.  MRI of the right wrist was performed to rule out septic arthritis which showed tenosynovitis involving the flexor tendons, joint effusion/septic arthritis not excluded.   Orthopedics was notified.Patient was given IV vancomycin and antibiotic and was admitted to hospital.    Assessment/Plan:  Principal Problem:   Cellulitis of right wrist  Cellulitis of the right wrist and distal forearm with tenosynovitis of the flexor tendons. Clinically undetermined whether this is related to diabetes.  Currently on IV vancomycin.  Patient has been seen by infectious disease.      Patient does have a history of right thigh purulent cellulitis recently.  Blood cultures negative so far.  Leukocytosis has improved at this time and has normalized...  Orthopedics on board and low suspicion for septic arthritis. .  Right upper extremity duplex did not show any evidence of DVT.  Spoke with the infectious disease who has high suspicion for possible CPPD.  Communicated with ID who recommended a rheumatology consultation.  I spoke with Dr. Gavin Potters rheumatology who will see the patient for consultation.Cathlean Sauer of the wrist shows possible calcification of the cartilage.  Type 1 diabetes mellitus with hyperglycemia  Patient uses  insulin pump at home and sees Dr. Monia Sabal with [facility] in the for diabetes management.  Currently insulin pump is off.  Patient has been started on Semglee 8 units.  Continue sliding scale insulin, Accu-Cheks diabetic diet.  Hemoglobin A1c from 12/21/2020 at 6.5.  POC glucose of 114.   Asthma, controlled. Continue albuterol, Singulair.   Restless leg syndrome. Continue Requip.   Peripheral neuropathy. Continue Neurontin.  Anxiety. Continue Klonopin, hydroxyzine.  Chronic shoulder pain, neck pain.  Patient supposed to follow-up with the pain management clinic as outpatient.  On tramadol and morphine at this time.  On Neurontin as well.  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  Rheumatology has been consulted  Recent right thigh wound.   Wound care has been consulted for dressing.  Had been treated previously with antibiotic.  Underweight.  BMI of 16.3, present on admission.  Has been seen by a dietitian.  Continue nutritional supplements with Glucerna and Ensure.  Disposition. Home when improved and okay with ID/rheumatology.  DVT prophylaxis: enoxaparin (LOVENOX) injection 40 mg Start: 12/21/20 2200  Code Status: Full code  Family Communication:  Spoke with the patient at bedside.  I tried to reach the patient's spouse on the phone but was unable to reach him.  Status is: Inpatient  Remains inpatient appropriate because: Need for IV antibiotics, rheumatological consultation,  Consultants: Orthopedics Infectious disease Rheumatology  Procedures: None  Anti-infectives:  Vancomycin IV  Anti-infectives (From admission, onward)    Start     Dose/Rate Route Frequency Ordered Stop   12/23/20 0600  vancomycin (VANCOREADY) IVPB 750 mg/150 mL  750 mg 150 mL/hr over 60 Minutes Intravenous Every 24 hours 12/22/20 0946     12/22/20 0600  vancomycin (VANCOCIN) IVPB 1000 mg/200 mL premix  Status:  Discontinued        1,000 mg 200 mL/hr over 60 Minutes Intravenous Every 24 hours  12/21/20 1952 12/22/20 0946   12/22/20 0500  ceFEPIme (MAXIPIME) 2 g in sodium chloride 0.9 % 100 mL IVPB  Status:  Discontinued        2 g 200 mL/hr over 30 Minutes Intravenous Every 12 hours 12/21/20 1952 12/22/20 0905   12/21/20 1600  vancomycin (VANCOCIN) IVPB 1000 mg/200 mL premix  Status:  Discontinued        1,000 mg 200 mL/hr over 60 Minutes Intravenous  Once 12/21/20 1550 12/21/20 1557   12/21/20 1600  ceFEPIme (MAXIPIME) 2 g in sodium chloride 0.9 % 100 mL IVPB  Status:  Discontinued        2 g 200 mL/hr over 30 Minutes Intravenous  Once 12/21/20 1550 12/21/20 1558   12/21/20 1545  ceFEPIme (MAXIPIME) 2 g in sodium chloride 0.9 % 100 mL IVPB        2 g 200 mL/hr over 30 Minutes Intravenous  Once 12/21/20 1543 12/21/20 1727   12/21/20 1545  vancomycin (VANCOREADY) IVPB 1250 mg/250 mL        1,250 mg 166.7 mL/hr over 90 Minutes Intravenous  Once 12/21/20 1543 12/21/20 1954       Subjective: Today, patient was seen and examined at bedside.  Still feels the same complains of shoulder pain wrist pain.  Redness and swelling has minimally improved.    Objective: Vitals:   12/24/20 0353 12/24/20 0817  BP: (!) 169/64 (!) 152/67  Pulse: (!) 59 (!) 56  Resp: 20 14  Temp: 98.6 F (37 C) 98 F (36.7 C)  SpO2: 94% 100%    Intake/Output Summary (Last 24 hours) at 12/24/2020 1104 Last data filed at 12/23/2020 2029 Gross per 24 hour  Intake 1770.7 ml  Output 700 ml  Net 1070.7 ml    Filed Weights   12/21/20 0841 12/21/20 1958  Weight: 54 kg 50.1 kg   Body mass index is 16.31 kg/m.   Physical Exam:  General: Thinly built, not in obvious distress HENT:   No scleral pallor or icterus noted. Oral mucosa is moist.  Chest:  Clear breath sounds.  Diminished breath sounds bilaterally. No crackles or wheezes.  CVS: S1 &S2 heard. No murmur.  Regular rate and rhythm. Abdomen: Soft, nontender, nondistended.  Bowel sounds are heard.   Extremities: No cyanosis, clubbing or edema.   Peripheral pulses are palpable.  Right wrist and distal forearm with redness and edema. Psych: Alert, awake and oriented, normal mood CNS:  No cranial nerve deficits.  Power equal in all extremities.   Skin: Warm and dry.  Erythema and redness of the right wrist and distal forearm, right thigh with dressing     Data Review: I have personally reviewed the following laboratory data and studies,  CBC: Recent Labs  Lab 12/21/20 0850 12/22/20 0506 12/23/20 0255 12/24/20 0843  WBC 10.5 11.2* 7.3 6.8  NEUTROABS 7.4  --   --   --   HGB 13.7 12.1 10.5* 10.8*  HCT 41.8 36.7 32.2* 32.6*  MCV 95.9 94.8 94.4 93.7  PLT 271 153 239 234    Basic Metabolic Panel: Recent Labs  Lab 12/21/20 0850 12/22/20 0506 12/23/20 0255 12/24/20 0843  NA 136 133* 134* 134*  K 3.9 4.5 3.8 3.6  CL 104 104 106 106  CO2 23 18* 20* 24  GLUCOSE 175* 187* 207* 146*  BUN CREATININE 0.67 0.65 0.63 0.47  CALCIUM 8.6* 7.6* 7.3* 7.2*  MG  --   --  2.0  --     Liver Function Tests: Recent Labs  Lab 12/21/20 0850  AST 16  ALT 15  ALKPHOS 65  BILITOT 0.9  PROT 7.5  ALBUMIN 4.1    No results for input(s): LIPASE, AMYLASE in the last 168 hours. No results for input(s): AMMONIA in the last 168 hours. Cardiac Enzymes: No results for input(s): CKTOTAL, CKMB, CKMBINDEX, TROPONINI in the last 168 hours. BNP (last 3 results) No results for input(s): BNP in the last 8760 hours.  ProBNP (last 3 results) No results for input(s): PROBNP in the last 8760 hours.  CBG: Recent Labs  Lab 12/23/20 0730 12/23/20 1135 12/23/20 1610 12/23/20 2035 12/24/20 0813  GLUCAP 206* 270* 114* 142* 114*    Recent Results (from the past 240 hour(s))  MRSA Next Gen by PCR, Nasal     Status: None   Collection Time: 12/17/20  1:30 PM   Specimen: Nasal Mucosa; Nasal Swab  Result Value Ref Range Status   MRSA by PCR Next Gen NOT DETECTED NOT DETECTED Final    Comment: (NOTE) The GeneXpert MRSA Assay (FDA  approved for NASAL specimens only), is one component of a comprehensive MRSA colonization surveillance program. It is not intended to diagnose MRSA infection nor to guide or monitor treatment for MRSA infections. Test performance is not FDA approved in patients less than 15 years old. Performed at [facility], [address] [place], [place] [address]   Culture, blood (Routine X 2) w Reflex to ID Panel     Status: None (Preliminary result)   Collection Time: 12/21/20 11:09 AM   Specimen: BLOOD  Result Value Ref Range Status   Specimen Description BLOOD LEFT UPPER ARM  Final   Special Requests   Final    BOTTLES DRAWN AEROBIC AND ANAEROBIC Blood Culture results may not be optimal due to an inadequate volume of blood received in culture bottles   Culture   Final    NO GROWTH 3 DAYS Performed at [facility], [address], [place], [place] [address]    Report Status PENDING  Incomplete  Culture, blood (Routine X 2) w Reflex to ID Panel     Status: None (Preliminary result)   Collection Time: 12/21/20 11:11 AM   Specimen: BLOOD  Result Value Ref Range Status   Specimen Description BLOOD BLOOD RIGHT FOREARM  Final   Special Requests   Final    BOTTLES DRAWN AEROBIC AND ANAEROBIC Blood Culture adequate volume   Culture   Final    NO GROWTH 3 DAYS Performed at [facility], [address] [place], [place] [address]    Report Status PENDING  Incomplete  Resp Panel by RT-PCR (Flu A&B, Covid) Nasopharyngeal Swab     Status: None   Collection Time: 12/21/20  3:49 PM   Specimen: Nasopharyngeal Swab; Nasopharyngeal(NP) swabs in vial transport medium  Result Value Ref Range Status   SARS Coronavirus 2 by RT PCR NEGATIVE NEGATIVE Final    Comment: (NOTE) SARS-CoV-2 target nucleic acids are NOT DETECTED.  The SARS-CoV-2 RNA is generally detectable in upper respiratory specimens during the acute phase of infection. The lowest concentration of SARS-CoV-2  viral copies this assay  can detect is 138 copies/mL. A negative result does not preclude SARS-Cov-2 infection and should not be used as the sole basis for treatment or other patient management decisions. A negative result may occur with  improper specimen collection/handling, submission of specimen other than nasopharyngeal swab, presence of viral mutation(s) within the areas targeted by this assay, and inadequate number of viral copies(<138 copies/mL). A negative result must be combined with clinical observations, patient history, and epidemiological information. The expected result is Negative.  Fact Sheet for Patients:  BloggerCourse.com  Fact Sheet for Healthcare Providers:  SeriousBroker.it  This test is no t yet approved or cleared by the [place] FDA and  has been authorized for detection and/or diagnosis of SARS-CoV-2 by FDA under an Emergency Use Authorization (EUA). This EUA will remain  in effect (meaning this test can be used) for the duration of the COVID-19 declaration under Section 564(b)(1) of the Act, 21 U.S.C.section 360bbb-3(b)(1), unless the authorization is terminated  or revoked sooner.       Influenza A by PCR NEGATIVE NEGATIVE Final   Influenza B by PCR NEGATIVE NEGATIVE Final    Comment: (NOTE) The Xpert Xpress SARS-CoV-2/FLU/RSV plus assay is intended as an aid in the diagnosis of influenza from Nasopharyngeal swab specimens and should not be used as a sole basis for treatment. Nasal washings and aspirates are unacceptable for Xpert Xpress SARS-CoV-2/FLU/RSV testing.  Fact Sheet for Patients: BloggerCourse.com  Fact Sheet for Healthcare Providers: SeriousBroker.it  This test is not yet approved or cleared by the [place] FDA and has been authorized for detection and/or diagnosis of SARS-CoV-2 by FDA under an Emergency Use Authorization  (EUA). This EUA will remain in effect (meaning this test can be used) for the duration of the COVID-19 declaration under Section 564(b)(1) of the Act, 21 U.S.C. section 360bbb-3(b)(1), unless the authorization is terminated or revoked.  Performed at [facility], [address] [place], [place] [address]       Studies: US Venous Img Upper Uni Right(DVT)  Result Date: 12/23/2020 CLINICAL DATA:  Right upper extremity Dema for 1 week Pain Diabetes History of tobacco use Color changes EXAM: RIGHT UPPER EXTREMITY VENOUS DOPPLER ULTRASOUND TECHNIQUE: Gray-scale sonography with graded compression, as well as color Doppler and duplex ultrasound were performed to evaluate the upper extremity deep venous system from the level of the subclavian vein and including the jugular, axillary, basilic, radial, ulnar and upper cephalic vein. Spectral Doppler was utilized to evaluate flow at rest and with distal augmentation maneuvers. COMPARISON:  None. FINDINGS: Contralateral Subclavian Vein: Respiratory phasicity is normal and symmetric with the symptomatic side. No evidence of thrombus. Normal compressibility. Internal Jugular Vein: No evidence of thrombus. Normal compressibility, respiratory phasicity and response to augmentation. Subclavian Vein: No evidence of thrombus. Normal compressibility, respiratory phasicity and response to augmentation. Axillary Vein: No evidence of thrombus. Normal compressibility, respiratory phasicity and response to augmentation. Cephalic Vein: No evidence of thrombus. Normal compressibility, respiratory phasicity and response to augmentation. Basilic Vein: No evidence of thrombus. Normal compressibility, respiratory phasicity and response to augmentation. Brachial Veins: No evidence of thrombus. Normal compressibility, respiratory phasicity and response to augmentation. Radial Veins: No evidence of thrombus. Normal compressibility, respiratory phasicity and response to  augmentation. Ulnar Veins: No evidence of thrombus. Normal compressibility, respiratory phasicity and response to augmentation. Other Findings:  None visualized. IMPRESSION: No evidence of DVT within the right upper extremity. Electronically Signed   By: Acquanetta Belling M.D.   On: 12/23/2020 09:33  Joycelyn Das, MD  [facility] 12/24/2020  If 7PM-7AM, please contact night-coverage

## 2020-12-24 NOTE — Plan of Care (Signed)
?  Problem: Clinical Measurements: ?Goal: Ability to avoid or minimize complications of infection will improve ?Outcome: Progressing ?  ?Problem: Skin Integrity: ?Goal: Skin integrity will improve ?Outcome: Progressing ?  ?

## 2020-12-24 NOTE — Consult Note (Signed)
Reason for Consult: Right wrist pain  Referring Physician: Hospitalist  [name]   HPI: 70 year old white female.  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  On insulin and pump Prior left shoulder surgery.  Rotator cuff repair.  Failed.  Repeat surgery and then reverse shoulder replacement.  Still has chronic pain.  Had evaluation with her orthopedics and the neurosurgeon.  Thought it was from her neck.  Pain into both shoulders.  Now 2 years in duration.  Has not had cervical disc surgery Right shoulder film evidence July showed chondrocalcinosis About 1 week or more of swelling of the right wrist 2 to 3 weeks before she had had an infected site where her insulin distribution was in her thigh.  Been taken out.  MRSA Upon arrival to [facility].  She had plain films that showed chondrocalcinosis.  Normal white count.  Sed rate 42.  Blood cultures negative.  MRI showed tenosynovitis.  On Celebrex 100 twice daily.  Does not think the wrist is better.  Knees have not been painful .  MRI also showed bone lateral triangular ligamentous tear  PMH: Diabetes  SURGICAL HISTORY: 3 shoulder surgeries  Family History: Negative for inflammatory arthritis  Social History: No significant cigarettes  Allergies:  Allergies  Allergen Reactions   Pregabalin Anaphylaxis   Erythromycin Other (See Comments)    Severe stomach upset    Medications: Continuous:  sodium chloride 100 mL/hr at 12/24/20 0154   vancomycin 750 mg (12/24/20 0527)        ROS: No chest pain.  No shortness of breath.  No GI upset.  No fever.  No psoriasis   PHYSICAL EXAM: Blood pressure (!) 152/67, pulse (!) 56, temperature 98 F (36.7 C), resp. rate 14, height 5\' 9"  (1.753 m), weight 50.1 kg, SpO2 100 %. Pleasant female.  Mild painful arc of motion both shoulders.  No definite effusion.  Elbows without nodules or tophi.  Right wrist extensor  tenosynovitis.  MCPs not swollen left hand mild hypertrophic change but no significant synovitis.  Small cool right knee effusion.  Left knee without synovitis.  MTPs nontender  Assessment: Right wrist tenosynovitis.  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   Charcot joint  Recommendations: Procedure: Right wrist prepped sterile manner.  Aspirate dry.  Injected with 1 cc Kenalog We issued right wrist splint Will give single dose of 40 mg of prednisone.  Hopefully that will calm down synovitis without significant elevation of her glucose She will need outpatient follow-up of her cervical disc disease  12/24/2020, 1:01 PM

## 2020-12-24 NOTE — Care Management Important Message (Signed)
Important Message  Patient Details  Name: Suzanne Snyder MRN: [id number] Date of Birth: [birth year]   Medicare Important Message Given:  Yes  Patient is in a isolation room so I called her room [id number]) and reviewed the Important Message from Medicare with her. She understands her right to appeal if she feels like she is being discharged too soon from the hospital. I asked if she would like a copy of the form and she declined. I wished her a speedy recovery and thanked her for time.   [name] A Vici Novick 12/24/2020, 1:41 PM

## 2020-12-25 DIAGNOSIS — L03113 Cellulitis of right upper limb: Secondary | ICD-10-CM | POA: Diagnosis not present

## 2020-12-25 LAB — GLUCOSE, CAPILLARY
Glucose-Capillary: 361 mg/dL — ABNORMAL HIGH (ref 70–99)
Glucose-Capillary: 350 mg/dL — ABNORMAL HIGH (ref 70–99)

## 2020-12-25 MED ORDER — TRAMADOL HCL 50 MG PO TABS: 50.0000 mg | ORAL_TABLET | Freq: Four times a day (QID) | ORAL | 0 refills | Status: AC | PRN

## 2020-12-25 MED ORDER — DOXYCYCLINE HYCLATE 100 MG PO TABS: 100.0000 mg | ORAL_TABLET | Freq: Two times a day (BID) | ORAL | 0 refills | Status: AC

## 2020-12-25 NOTE — Progress Notes (Signed)
DISCHARGE NOTE:  Pt given discharge instructions. Pt verbalized understanding. Home medication (Flexeril) returned to pt, brace in place on right hand. Pt wheeled to car by staff. Husband providing transportation.

## 2020-12-25 NOTE — Discharge Summary (Signed)
Physician Discharge Summary  Suzanne Snyder YKZ:[id number] DOB: 1950/08/27 DOA: 12/21/2020  PCP: Katrinka Blazing, MD  Admit date: 12/21/2020 Discharge date: 12/25/2020  Admitted From: Home Disposition: Home  Recommendations for Outpatient Follow-up:  Follow up with PCP in 1-2 weeks Please obtain BMP/CBC in one week Schedule follow-up with rheumatology.  Home Health: N/A Equipment/Devices: N/A, splint of the right wrist  Discharge Condition: Fair CODE STATUS: Full code Diet recommendation: Low-salt and low-carb diet  Discharge summary: Suzanne Snyder is a 70 y.o. female with past medical history of asthma, arthritis, anxiety, type 1 diabetes on insulin pump,  osteoporosis, restless leg syndrome presented to hospital with wrist pain and swelling.  Of note, patient was recently admitted to hospital for right-sided thigh purulent cellulitis and had I&D done and was discharged on 12/11/2020.  In the ED, respiratory rate was slightly elevated.  Blood glucose level was elevated at 175.  CBC within normal limits.  Sed rate was 42.  Blood cultures were obtained.  X-ray showed no evidence of bony abnormality.  MRI of the right wrist was performed to rule out septic arthritis which showed tenosynovitis involving the flexor tendons, joint effusion/septic arthritis not excluded.   Cultures were drawn and patient was admitted to hospital and treated with broad-spectrum antibiotics.    Tenosynovitis of the flexor tendons, multiple joint pain, right wrist pain and swelling: Recently treated for MRSA abscess right thigh, no bacteremia.  Patient was given dalbavancin injection and discharged home. Seen by orthopedics, suggested conservative management and no evidence of septic arthritis. Seen by infectious disease, monitored off antibiotics. Seen by rheumatology, steroid injections right wrist along with 1 dose of prednisone.  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   Recommended symptomatic treatment. Doxycycline for 7 days as treatment for previous MRSA right thigh abscess. Will prescribe short course of tramadol for pain relief, she will also use a muscle relaxant.  She will schedule follow-up with rheumatology.  Multiple chronic medical issues including type 1 diabetes on insulin pump, she will resume at home.  Other medical issues remained stable.  Discussed with infectious disease.  Stable for discharge.     Discharge Diagnoses:  Principal Problem:   Cellulitis of right wrist    Discharge Instructions  Discharge Instructions     Call MD for:  redness, tenderness, or signs of infection (pain, swelling, redness, odor or green/yellow discharge around incision site)   Complete by: As directed    Call MD for:  severe uncontrolled pain   Complete by: As directed    Diet - low sodium heart healthy   Complete by: As directed    Diet Carb Modified   Complete by: As directed    Discharge instructions   Complete by: As directed    Resume your insulin pump after reaching home   Discharge wound care:   Complete by: As directed    Clean with normal saline , pack with sterile gauge and cover with dry dressing. Change every day   Increase activity slowly   Complete by: As directed       Allergies as of 12/25/2020       Reactions   Pregabalin Anaphylaxis   Erythromycin Other (See Comments)   Severe stomach upset        Medication List     STOP taking these medications    ciclopirox 0.77 % cream Commonly known as: LOPROX   lidocaine 5 % Commonly known as: LIDODERM   montelukast  10 MG tablet Commonly known as: SINGULAIR   mupirocin ointment 2 % Commonly known as: BACTROBAN   tapentadol 50 MG tablet Commonly known as: NUCYNTA       TAKE these medications    acetaminophen 325 MG tablet Commonly known as: TYLENOL Take 650 mg by mouth every 6 (six) hours as needed.   albuterol 108 (90 Base) MCG/ACT inhaler Commonly  known as: VENTOLIN HFA Inhale into the lungs every 6 (six) hours as needed for wheezing or shortness of breath.   ammonium lactate 12 % cream Commonly known as: Lac-Hydrin Apply topically as needed for dry skin. Apply to affected areas of hands and feet   aspirin 81 MG EC tablet Take 81 mg by mouth daily. Swallow whole.   BENADRYL ALLERGY PO Take 50 mg by mouth as needed.   celecoxib 100 MG capsule Commonly known as: CELEBREX Take 100 mg by mouth 2 (two) times daily.   clonazePAM 0.5 MG tablet Commonly known as: KLONOPIN Take 0.5 mg by mouth 2 (two) times daily as needed for anxiety.   cyclobenzaprine 10 MG tablet Commonly known as: FLEXERIL Take 10 mg by mouth 3 (three) times daily as needed for muscle spasms.   doxycycline 100 MG tablet Commonly known as: VIBRA-TABS Take 1 tablet (100 mg total) by mouth every 12 (twelve) hours for 7 days.   Fish Oil 1000 MG Caps Take 1.5 capsules by mouth 2 (two) times daily.   gabapentin 300 MG capsule Commonly known as: NEURONTIN Take 300 mg by mouth 4 (four) times daily.   hydrOXYzine 10 MG tablet Commonly known as: ATARAX TAKE 1 OR 2 TABLET BY MOUTH THREE TIMES DAILY AS NEEDED FOR ITCH   insulin aspart 100 UNIT/ML injection Commonly known as: novoLOG Inject into the skin 4 (four) times daily as needed for high blood sugar (with meals and at bedtime). Sliding scale   ipratropium 0.02 % nebulizer solution Commonly known as: ATROVENT Take 0.5 mg by nebulization every 6 (six) hours as needed for wheezing or shortness of breath.   MULTIVITAMIN ADULT PO Take by mouth 2 (two) times daily.   rOPINIRole 1 MG tablet Commonly known as: REQUIP Take 1.5 mg by mouth at bedtime.   traMADol 50 MG tablet Commonly known as: ULTRAM Take 1 tablet (50 mg total) by mouth every 6 (six) hours as needed for up to 5 days for moderate pain. What changed:  when to take this reasons to take this   triamcinolone cream 0.1 % Commonly known as:  KENALOG Apply a small amount to affected area 1 to 2 times daily as needed for itchy areas on back, Avoid face, groin, and underarms. NEEDS APPT FOR FURTHER REFILLS   triamcinolone cream 0.1 % Commonly known as: KENALOG Apply 1 application topically 2 (two) times daily as needed (Rash). To affected areas of hands and back until rash cleared   Vitamin D (Ergocalciferol) 1.25 MG (50000 UNIT) Caps capsule Commonly known as: DRISDOL Take 50,000 Units by mouth 2 (two) times a week.               Discharge Care Instructions  (From admission, onward)           Start     Ordered   12/25/20 0000  Discharge wound care:       Comments: Clean with normal saline , pack with sterile gauge and cover with dry dressing. Change every day   12/25/20 1023  Follow-up Information     Kandyce Rud., MD. Nyra Capes on 12/31/2020.   Specialty: Rheumatology Why: Appt @ 8:15 am Contact information: [address] [facility] [place] [address] [phone]                Allergies  Allergen Reactions   Pregabalin Anaphylaxis   Erythromycin Other (See Comments)    Severe stomach upset    Consultations: Infectious disease Orthopedics Rheumatology   Procedures/Studies: DG Wrist Complete Right  Result Date: 12/21/2020 CLINICAL DATA:  Swelling/pain EXAM: RIGHT WRIST - COMPLETE 3+ VIEW COMPARISON:  None. FINDINGS: Alignment is anatomic. No acute fracture. Joint spaces are preserved. There is nonspecific calcification of the TFCC. IMPRESSION: No acute osseous abnormality. Electronically Signed   By: Guadlupe Spanish M.D.   On: 12/21/2020 09:55   CT HEAD WO CONTRAST ( )  Result Date: 12/22/2020 CLINICAL DATA:  Fall EXAM: CT HEAD WITHOUT CONTRAST TECHNIQUE: Contiguous axial images were obtained from the base of the skull through the vertex without intravenous contrast. COMPARISON:  CT orbits dated 05/20/2020.  CT head dated 11/24/2010.  FINDINGS: Brain: No evidence of acute infarction, hemorrhage, hydrocephalus, extra-axial collection or mass lesion/mass effect. Mild subcortical white matter and periventricular small vessel ischemic changes. Vascular: Mild intracranial atherosclerosis. Skull: Normal. Negative for fracture or focal lesion. Sinuses/Orbits: Partial opacification of the right maxillary sinus. Visualized paranasal sinuses and mastoid air cells are otherwise clear. Other: None. IMPRESSION: No evidence of acute intracranial abnormality. Mild small vessel ischemic changes. Electronically Signed   By: Charline Bills M.D.   On: 12/22/2020 03:20   MR WRIST RIGHT WO CONTRAST  Result Date: 12/21/2020 CLINICAL DATA:  Wrist pain, infection suspected.  No known injury. EXAM: MR OF THE RIGHT WRIST WITHOUT CONTRAST TECHNIQUE: Multiplanar, multisequence MR imaging of the right wrist was performed. No intravenous contrast was administered. COMPARISON:  X-ray 12/21/2020 FINDINGS: Motion degraded coronal and axial T1 and T2 fat saturated sequences were obtained. Patient terminated the exam and no further sequences were able to be obtained. Best possible images submitted for interpretation. Bones/Joint/Cartilage No evidence of acute fracture or dislocation. Mild degenerative changes, most pronounced at the first [facility] joint. Small subchondral cyst or intraosseous ganglion within the hamate. Patchy marrow edema within the capitate and hamate, which may be degenerative. No focal erosion evident. Small radiocarpal and distal radioulnar joint effusions, nonspecific. Ligaments Intrinsic wrist ligaments appear grossly intact. Suspected tear of the articular disc of the TFCC (series 11, image 8). Muscles and Tendons Tenosynovitis within the flexor tendons extending into the visualized aspect of the distal forearm. Trace tenosynovitis involving the third extensor compartment distal to the level of the distal intersection. Soft tissues Mild soft tissue  edema. No organized extra-articular fluid collection is seen. IMPRESSION: 1. Motion degraded, incomplete exam. 2. Tenosynovitis involving the flexor tendons extending into the visualized aspect of the distal forearm. Trace tenosynovitis involving the third extensor compartment. Findings are nonspecific and could reflect infectious or inflammatory tenosynovitis. 3. Small radiocarpal and distal radioulnar joint effusions, nonspecific. Septic arthritis not excluded. 4. Suspected tear of the articular disc of the TFCC. 5. No acute osseous findings. Electronically Signed   By: Duanne Guess D.O.   On: 12/21/2020 16:50   US Venous Img Upper Uni Right(DVT)  Result Date: 12/23/2020 CLINICAL DATA:  Right upper extremity Dema for 1 week Pain Diabetes History of tobacco use Color changes EXAM: RIGHT UPPER EXTREMITY VENOUS DOPPLER ULTRASOUND TECHNIQUE: Gray-scale sonography with graded compression, as well as  color Doppler and duplex ultrasound were performed to evaluate the upper extremity deep venous system from the level of the subclavian vein and including the jugular, axillary, basilic, radial, ulnar and upper cephalic vein. Spectral Doppler was utilized to evaluate flow at rest and with distal augmentation maneuvers. COMPARISON:  None. FINDINGS: Contralateral Subclavian Vein: Respiratory phasicity is normal and symmetric with the symptomatic side. No evidence of thrombus. Normal compressibility. Internal Jugular Vein: No evidence of thrombus. Normal compressibility, respiratory phasicity and response to augmentation. Subclavian Vein: No evidence of thrombus. Normal compressibility, respiratory phasicity and response to augmentation. Axillary Vein: No evidence of thrombus. Normal compressibility, respiratory phasicity and response to augmentation. Cephalic Vein: No evidence of thrombus. Normal compressibility, respiratory phasicity and response to augmentation. Basilic Vein: No evidence of thrombus. Normal  compressibility, respiratory phasicity and response to augmentation. Brachial Veins: No evidence of thrombus. Normal compressibility, respiratory phasicity and response to augmentation. Radial Veins: No evidence of thrombus. Normal compressibility, respiratory phasicity and response to augmentation. Ulnar Veins: No evidence of thrombus. Normal compressibility, respiratory phasicity and response to augmentation. Other Findings:  None visualized. IMPRESSION: No evidence of DVT within the right upper extremity. Electronically Signed   By: Acquanetta Belling M.D.   On: 12/23/2020 09:33   (Echo, Carotid, EGD, Colonoscopy, ERCP)    Subjective: Patient seen and examined.  Moderate pain persist on the right wrist, right elbow and right shoulder.  Blood sugar slightly elevated but is stable and usual for her after episode of steroid administration.  Afebrile.   Discharge Exam: Vitals:   12/25/20 0805 12/25/20 1123  BP: (!) 142/60 (!) 150/55  Pulse: 72 75  Resp: 18 18  Temp: 98.3 F (36.8 C) 98.9 F (37.2 C)  SpO2: 95% 99%   Vitals:   12/24/20 1931 12/25/20 0454 12/25/20 0805 12/25/20 1123  BP: (!) 151/66 (!) 144/61 (!) 142/60 (!) 150/55  Pulse: 67 67 72 75  Resp: Temp: 98.2 F (36.8 C) 97.8 F (36.6 C) 98.3 F (36.8 C) 98.9 F (37.2 C)  TempSrc:      SpO2: 94% 97% 95% 99%  Weight:      Height:        General: Pt is alert, awake, not in acute distress On room air.  Frail and debilitated.  Not in any distress.  Thin and cachectic looking. Cardiovascular: RRR, S1/S2 +, no rubs, no gallops Respiratory: CTA bilaterally, no wheezing, no rhonchi Abdominal: Soft, NT, ND, bowel sounds + Extremities: no edema, no cyanosis Right wrist on splint, minimal tenderness on range of motion.  No evidence of erythema or effusion.    The results of significant diagnostics from this hospitalization (including imaging, microbiology, ancillary and laboratory) are listed below for reference.      Microbiology: Recent Results (from the past 240 hour(s))  MRSA Next Gen by PCR, Nasal     Status: None   Collection Time: 12/17/20  1:30 PM   Specimen: Nasal Mucosa; Nasal Swab  Result Value Ref Range Status   MRSA by PCR Next Gen NOT DETECTED NOT DETECTED Final    Comment: (NOTE) The GeneXpert MRSA Assay (FDA approved for NASAL specimens only), is one component of a comprehensive MRSA colonization surveillance program. It is not intended to diagnose MRSA infection nor to guide or monitor treatment for MRSA infections. Test performance is not FDA approved in patients less than 41 years old. Performed at [facility], [address], [place], [place] [address]  Culture, blood (Routine X 2) w Reflex to ID Panel     Status: None (Preliminary result)   Collection Time: 12/21/20 11:09 AM   Specimen: BLOOD  Result Value Ref Range Status   Specimen Description BLOOD LEFT UPPER ARM  Final   Special Requests   Final    BOTTLES DRAWN AEROBIC AND ANAEROBIC Blood Culture results may not be optimal due to an inadequate volume of blood received in culture bottles   Culture   Final    NO GROWTH 4 DAYS Performed at [facility], [address], [place], [place] [address]    Report Status PENDING  Incomplete  Culture, blood (Routine X 2) w Reflex to ID Panel     Status: None (Preliminary result)   Collection Time: 12/21/20 11:11 AM   Specimen: BLOOD  Result Value Ref Range Status   Specimen Description BLOOD BLOOD RIGHT FOREARM  Final   Special Requests   Final    BOTTLES DRAWN AEROBIC AND ANAEROBIC Blood Culture adequate volume   Culture   Final    NO GROWTH 4 DAYS Performed at [facility], [address] [place], [place] [address]    Report Status PENDING  Incomplete  Resp Panel by RT-PCR (Flu A&B, Covid) Nasopharyngeal Swab     Status: None   Collection Time: 12/21/20  3:49 PM   Specimen: Nasopharyngeal Swab; Nasopharyngeal(NP) swabs in vial  transport medium  Result Value Ref Range Status   SARS Coronavirus 2 by RT PCR NEGATIVE NEGATIVE Final    Comment: (NOTE) SARS-CoV-2 target nucleic acids are NOT DETECTED.  The SARS-CoV-2 RNA is generally detectable in upper respiratory specimens during the acute phase of infection. The lowest concentration of SARS-CoV-2 viral copies this assay can detect is 138 copies/mL. A negative result does not preclude SARS-Cov-2 infection and should not be used as the sole basis for treatment or other patient management decisions. A negative result may occur with  improper specimen collection/handling, submission of specimen other than nasopharyngeal swab, presence of viral mutation(s) within the areas targeted by this assay, and inadequate number of viral copies(<138 copies/mL). A negative result must be combined with clinical observations, patient history, and epidemiological information. The expected result is Negative.  Fact Sheet for Patients:  BloggerCourse.com  Fact Sheet for Healthcare Providers:  SeriousBroker.it  This test is no t yet approved or cleared by the [place] FDA and  has been authorized for detection and/or diagnosis of SARS-CoV-2 by FDA under an Emergency Use Authorization (EUA). This EUA will remain  in effect (meaning this test can be used) for the duration of the COVID-19 declaration under Section 564(b)(1) of the Act, 21 U.S.C.section 360bbb-3(b)(1), unless the authorization is terminated  or revoked sooner.       Influenza A by PCR NEGATIVE NEGATIVE Final   Influenza B by PCR NEGATIVE NEGATIVE Final    Comment: (NOTE) The Xpert Xpress SARS-CoV-2/FLU/RSV plus assay is intended as an aid in the diagnosis of influenza from Nasopharyngeal swab specimens and should not be used as a sole basis for treatment. Nasal washings and aspirates are unacceptable for Xpert Xpress SARS-CoV-2/FLU/RSV testing.  Fact  Sheet for Patients: BloggerCourse.com  Fact Sheet for Healthcare Providers: SeriousBroker.it  This test is not yet approved or cleared by the [place] FDA and has been authorized for detection and/or diagnosis of SARS-CoV-2 by FDA under an Emergency Use Authorization (EUA). This EUA will remain in effect (meaning this test can be used) for the duration  of the COVID-19 declaration under Section 564(b)(1) of the Act, 21 U.S.C. section 360bbb-3(b)(1), unless the authorization is terminated or revoked.  Performed at [facility], [address] [place], [place] [address]      Labs: BNP (last 3 results) No results for input(s): BNP in the last 8760 hours. Basic Metabolic Panel: Recent Labs  Lab 12/21/20 0850 12/22/20 0506 12/23/20 0255 12/24/20 0843  NA 136 133* 134* 134*  K 3.9 4.5 3.8 3.6  CL 104 104 106 106  CO2 23 18* 20* 24  GLUCOSE 175* 187* 207* 146*  BUN 11 15 15 9   CREATININE 0.67 0.65 0.63 0.47  CALCIUM 8.6* 7.6* 7.3* 7.2*  MG  --   --  2.0  --    Liver Function Tests: Recent Labs  Lab 12/21/20 0850  AST 16  ALT 15  ALKPHOS 65  BILITOT 0.9  PROT 7.5  ALBUMIN 4.1   No results for input(s): LIPASE, AMYLASE in the last 168 hours. No results for input(s): AMMONIA in the last 168 hours. CBC: Recent Labs  Lab 12/21/20 0850 12/22/20 0506 12/23/20 0255 12/24/20 0843  WBC 10.5 11.2* 7.3 6.8  NEUTROABS 7.4  --   --   --   HGB 13.7 12.1 10.5* 10.8*  HCT 41.8 36.7 32.2* 32.6*  MCV 95.9 94.8 94.4 93.7  PLT 271 153 239 234   Cardiac Enzymes: No results for input(s): CKTOTAL, CKMB, CKMBINDEX, TROPONINI in the last 168 hours. BNP: Invalid input(s): POCBNP CBG: Recent Labs  Lab 12/24/20 1125 12/24/20 1607 12/24/20 2059 12/25/20 0738 12/25/20 1121  GLUCAP 181* 99 230* 361* 350*   D-Dimer No results for input(s): DDIMER in the last 72 hours. Hgb A1c No results for input(s): HGBA1C in  the last 72 hours. Lipid Profile No results for input(s): CHOL, HDL, LDLCALC, TRIG, CHOLHDL, LDLDIRECT in the last 72 hours. Thyroid function studies No results for input(s): TSH, T4TOTAL, T3FREE, THYROIDAB in the last 72 hours.  Invalid input(s): FREET3 Anemia work up No results for input(s): VITAMINB12, FOLATE, FERRITIN, TIBC, IRON, RETICCTPCT in the last 72 hours. Urinalysis    Component Value Date/Time   COLORURINE YELLOW (A) 12/21/2020 1341   APPEARANCEUR HAZY (A) 12/21/2020 1341   LABSPEC 1.019 12/21/2020 1341   PHURINE 6.0 12/21/2020 1341   GLUCOSEU NEGATIVE 12/21/2020 1341   HGBUR NEGATIVE 12/21/2020 1341   BILIRUBINUR NEGATIVE 12/21/2020 1341   KETONESUR 20 (A) 12/21/2020 1341   PROTEINUR 30 (A) 12/21/2020 1341   NITRITE NEGATIVE 12/21/2020 1341   LEUKOCYTESUR TRACE (A) 12/21/2020 1341   Sepsis Labs Invalid input(s): PROCALCITONIN,  WBC,  LACTICIDVEN Microbiology Recent Results (from the past 240 hour(s))  MRSA Next Gen by PCR, Nasal     Status: None   Collection Time: 12/17/20  1:30 PM   Specimen: Nasal Mucosa; Nasal Swab  Result Value Ref Range Status   MRSA by PCR Next Gen NOT DETECTED NOT DETECTED Final    Comment: (NOTE) The GeneXpert MRSA Assay (FDA approved for NASAL specimens only), is one component of a comprehensive MRSA colonization surveillance program. It is not intended to diagnose MRSA infection nor to guide or monitor treatment for MRSA infections. Test performance is not FDA approved in patients less than 43 years old. Performed at [facility], [address] [place], [place] [address]   Culture, blood (Routine X 2) w Reflex to ID Panel     Status: None (Preliminary result)   Collection Time: 12/21/20 11:09 AM   Specimen:  BLOOD  Result Value Ref Range Status   Specimen Description BLOOD LEFT UPPER ARM  Final   Special Requests   Final    BOTTLES DRAWN AEROBIC AND ANAEROBIC Blood Culture results may not be optimal due to an  inadequate volume of blood received in culture bottles   Culture   Final    NO GROWTH 4 DAYS Performed at [facility], [address] [place], [place] [address]    Report Status PENDING  Incomplete  Culture, blood (Routine X 2) w Reflex to ID Panel     Status: None (Preliminary result)   Collection Time: 12/21/20 11:11 AM   Specimen: BLOOD  Result Value Ref Range Status   Specimen Description BLOOD BLOOD RIGHT FOREARM  Final   Special Requests   Final    BOTTLES DRAWN AEROBIC AND ANAEROBIC Blood Culture adequate volume   Culture   Final    NO GROWTH 4 DAYS Performed at [facility], [address] [place], [place] [address]    Report Status PENDING  Incomplete  Resp Panel by RT-PCR (Flu A&B, Covid) Nasopharyngeal Swab     Status: None   Collection Time: 12/21/20  3:49 PM   Specimen: Nasopharyngeal Swab; Nasopharyngeal(NP) swabs in vial transport medium  Result Value Ref Range Status   SARS Coronavirus 2 by RT PCR NEGATIVE NEGATIVE Final    Comment: (NOTE) SARS-CoV-2 target nucleic acids are NOT DETECTED.  The SARS-CoV-2 RNA is generally detectable in upper respiratory specimens during the acute phase of infection. The lowest concentration of SARS-CoV-2 viral copies this assay can detect is 138 copies/mL. A negative result does not preclude SARS-Cov-2 infection and should not be used as the sole basis for treatment or other patient management decisions. A negative result may occur with  improper specimen collection/handling, submission of specimen other than nasopharyngeal swab, presence of viral mutation(s) within the areas targeted by this assay, and inadequate number of viral copies(<138 copies/mL). A negative result must be combined with clinical observations, patient history, and epidemiological information. The expected result is Negative.  Fact Sheet for Patients:  BloggerCourse.com  Fact Sheet for Healthcare Providers:   SeriousBroker.it  This test is no t yet approved or cleared by the [place] FDA and  has been authorized for detection and/or diagnosis of SARS-CoV-2 by FDA under an Emergency Use Authorization (EUA). This EUA will remain  in effect (meaning this test can be used) for the duration of the COVID-19 declaration under Section 564(b)(1) of the Act, 21 U.S.C.section 360bbb-3(b)(1), unless the authorization is terminated  or revoked sooner.       Influenza A by PCR NEGATIVE NEGATIVE Final   Influenza B by PCR NEGATIVE NEGATIVE Final    Comment: (NOTE) The Xpert Xpress SARS-CoV-2/FLU/RSV plus assay is intended as an aid in the diagnosis of influenza from Nasopharyngeal swab specimens and should not be used as a sole basis for treatment. Nasal washings and aspirates are unacceptable for Xpert Xpress SARS-CoV-2/FLU/RSV testing.  Fact Sheet for Patients: BloggerCourse.com  Fact Sheet for Healthcare Providers: SeriousBroker.it  This test is not yet approved or cleared by the [place] FDA and has been authorized for detection and/or diagnosis of SARS-CoV-2 by FDA under an Emergency Use Authorization (EUA). This EUA will remain in effect (meaning this test can be used) for the duration of the COVID-19 declaration under Section 564(b)(1) of the Act, 21 U.S.C. section 360bbb-3(b)(1), unless the authorization is terminated or revoked.  Performed at [facility], [phone]  [address], [place], [place] [address]      Time coordinating discharge:  40 minutes  SIGNED:   Dorcas Carrow, MD  [facility] 12/25/2020, 12:42 PM

## 2020-12-25 NOTE — Progress Notes (Signed)
° °  Date of Admission:  12/21/2020        Subjective: Patient was seen by Dr. Gavin Potters and underwent right wrist steroid injection.  No fluid was there to aspirate. She is feeling a slight improvement. Medications:   aspirin EC  81 mg Oral Daily   celecoxib  100 mg Oral BID   doxycycline  100 mg Oral Q12H   enoxaparin (LOVENOX) injection  40 mg Subcutaneous Q24H   feeding supplement (GLUCERNA SHAKE)  237 mL Oral BID BM   gabapentin  300 mg Oral QID   insulin aspart  0-9 Units Subcutaneous TID WC   insulin glargine-yfgn  8 Units Subcutaneous Daily   lidocaine  1 patch Transdermal Q24H   mouth rinse  15 mL Mouth Rinse BID   multivitamin with minerals   Oral BID   mupirocin ointment   Nasal BID   omega-3 acid ethyl esters  1 g Oral BID   Ensure Max Protein  11 oz Oral QHS   rOPINIRole  1.5 mg Oral QHS   triamcinolone cream   Topical BID   Vitamin D (Ergocalciferol)  50,000 Units Oral Once per day on Mon Thu    Objective: Vital signs in last 24 hours: Temp:  [97.8 F (36.6 C)-98.9 F (37.2 C)] 98.9 F (37.2 C) (12/14 1123) Pulse Rate:  [59-75] 75 (12/14 1123) Resp:  [15-18] 18 (12/14 1123) BP: (142-163)/(55-66) 150/55 (12/14 1123) SpO2:  [94 %-99 %] 99 % (12/14 1123)  PHYSICAL EXAM:  General: Alert, cooperative, no distress, appears stated age.  Lungs: Clear to auscultation bilaterally. No Wheezing or Rhonchi. No rales. Heart: Regular rate and rhythm, no murmur, rub or gallop. Abdomen: Soft, non-tender,not distended. Bowel sounds normal. No masses Extremities: Right thigh I&D site almost healed.  No erythema or induration around the site.  Right wrist bandaged.  Skin: No rashes or lesions. Or bruising Lymph: Cervical, supraclavicular normal. 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  Seen by Dr. Gavin Potters and he has injected the right wrist with steroid. Also 40 mg of prednisone 1 dose was given. Infection unlikely because of absent fever, no leukocytosis as well as no bacteremia. Vancomycin can be discontinued.  She can be started on doxycycline 100 mg p.o. daily for 7 days.  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  This was present in November and she received a dose of dalbavancin and also had incision and drainage of the right thigh.  That infection has improved a lot Diabetes mellitus was on insulin pump.  Now on sliding scale insulin weekly  Right foot Charcot deformity  Right shoulder pain history mobility because of chondrocalcinosis  History of left shoulder  replacement.   Discussed the management with the patient and the care team.

## 2020-12-26 LAB — CULTURE, BLOOD (ROUTINE X 2)
Culture: NO GROWTH
Culture: NO GROWTH
Special Requests: ADEQUATE

## 2021-01-29 ENCOUNTER — Other Ambulatory Visit: Payer: Self-pay | Admitting: Rheumatology

## 2021-01-29 DIAGNOSIS — M542 Cervicalgia: Secondary | ICD-10-CM

## 2021-01-29 DIAGNOSIS — M5412 Radiculopathy, cervical region: Secondary | ICD-10-CM

## 2021-02-07 ENCOUNTER — Ambulatory Visit
Admission: RE | Admit: 2021-02-07 | Discharge: 2021-02-07 | Disposition: A | Discharge status: Home / Self Care | Payer: Medicare Other | Source: Ambulatory Visit | Attending: Rheumatology | Admitting: Rheumatology

## 2021-02-07 ENCOUNTER — Other Ambulatory Visit: Payer: Self-pay

## 2021-02-07 DIAGNOSIS — M542 Cervicalgia: Secondary | ICD-10-CM

## 2021-02-07 DIAGNOSIS — M5412 Radiculopathy, cervical region: Secondary | ICD-10-CM

## 2021-02-09 ENCOUNTER — Ambulatory Visit
Admission: RE | Admit: 2021-02-09 | Discharge: 2021-02-09 | Disposition: A | Discharge status: Home / Self Care | Payer: Medicare Other | Source: Ambulatory Visit | Attending: Rheumatology | Admitting: Rheumatology

## 2021-02-09 DIAGNOSIS — M542 Cervicalgia: Secondary | ICD-10-CM | POA: Insufficient documentation

## 2021-02-09 DIAGNOSIS — M5412 Radiculopathy, cervical region: Secondary | ICD-10-CM | POA: Insufficient documentation

## 2021-02-09 IMAGING — MR MR CERVICAL SPINE W/O CM
5 series · 37 of 48 positions shown · non-contrast
Comparison: CT face [DATE].

CLINICAL DATA: 70-year-old female with progressive right side neck
and shoulder pain radiating to the clavicle. No known injury.

EXAM:
MRI CERVICAL SPINE WITHOUT CONTRAST
TECHNIQUE: Multiplanar, multisequence MR imaging of the cervical spine was
performed. No intravenous contrast was administered.

[Series 5: T2 · sagittal · 3.0mm · 0.62mm/px · 7 of 15 slices shown (1 of 2)]
[im 1/15]
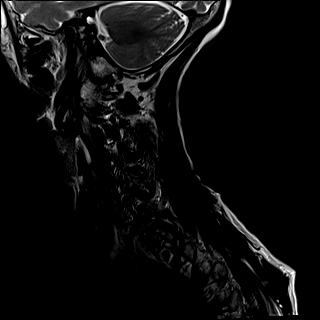
[im 3/15]
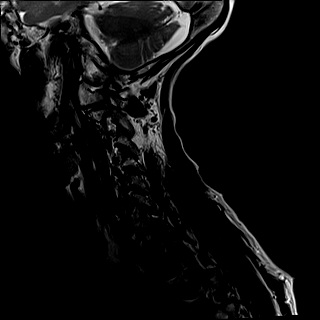
[im 5/15]
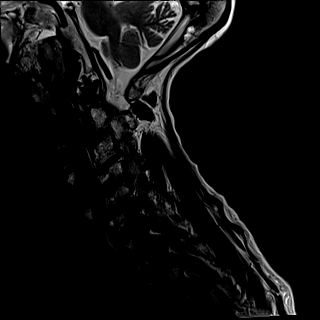
[im 8/15]
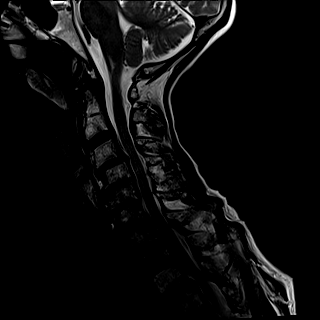
[im 10/15]
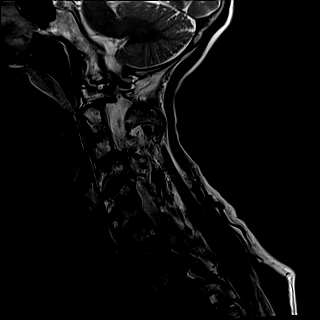
[im 12/15]
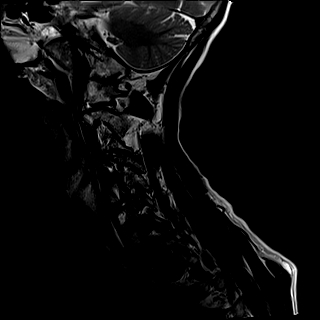
[im 15/15]
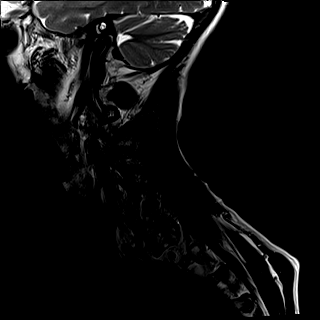

[Series 6: FLAIR · sagittal · 3.0mm · 0.78mm/px · 7 of 15 slices shown]
[im 1/15]
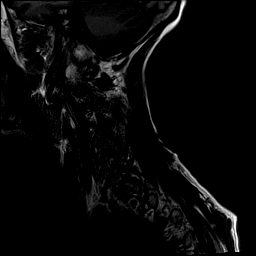
[im 3/15]
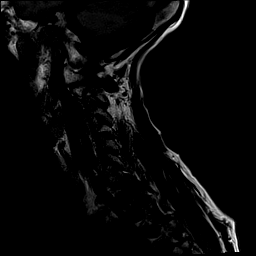
[im 5/15]
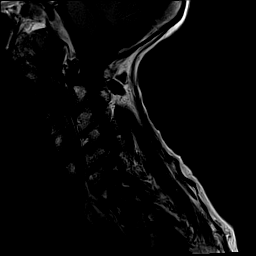
[im 8/15]
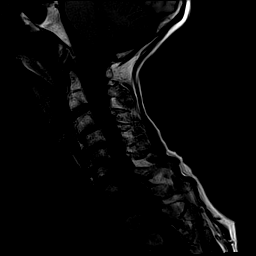
[im 10/15]
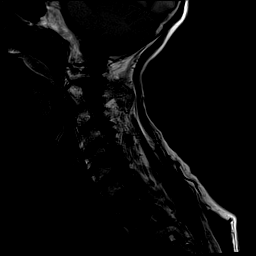
[im 12/15]
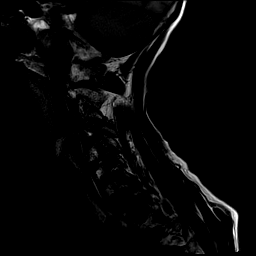
[im 15/15]
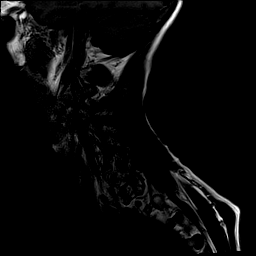

[Series 7: STIR · sagittal · 3.0mm · 0.62mm/px · 7 of 15 slices shown]
[im 1/15]
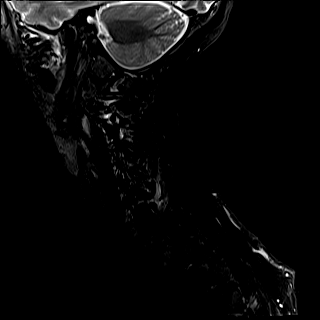
[im 3/15]
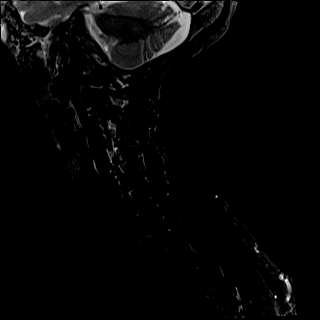
[im 5/15]
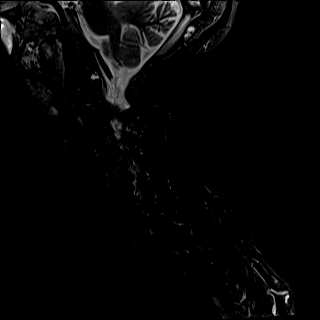
[im 8/15]
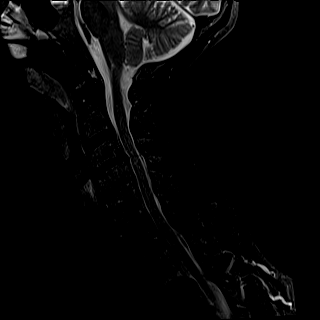
[im 10/15]
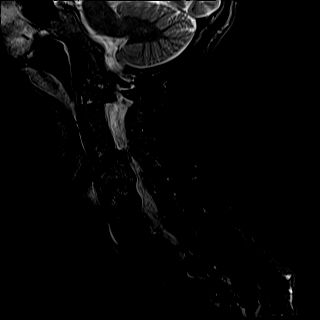
[im 12/15]
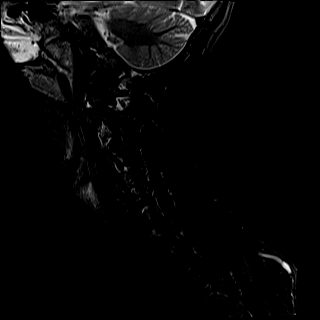
[im 15/15]
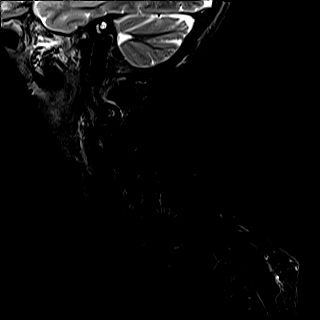

[Series 8: T2 · axial · 3.0mm · 0.70mm/px · z∈[-159,-69]mm · 9 of 29 slices shown (2 of 2)]
[im 1/29]
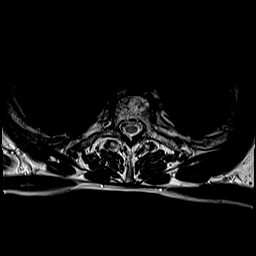
[im 5/29]
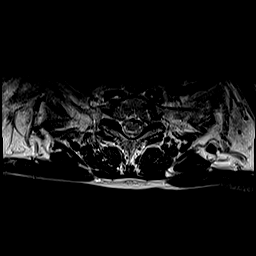
[im 10/29]
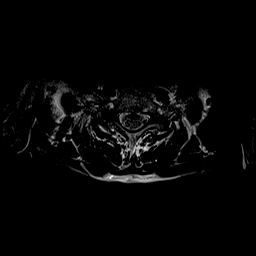
[im 12/29]
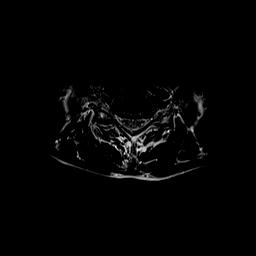
[im 15/29]
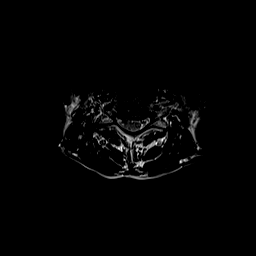
[im 17/29]
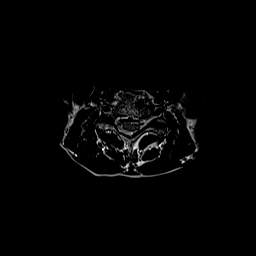
[im 19/29]
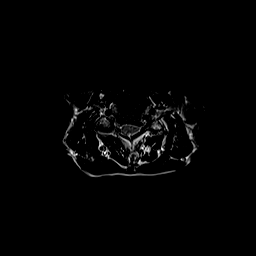
[im 24/29]
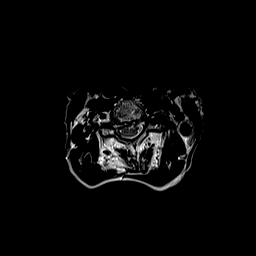
[im 29/29]
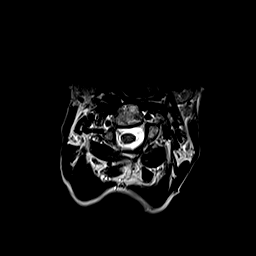

[Series 9: ax mpgr · axial · 3.0mm · 0.35mm/px · z∈[-159,-85]mm · 7 of 30 slices shown]
[im 1/30]
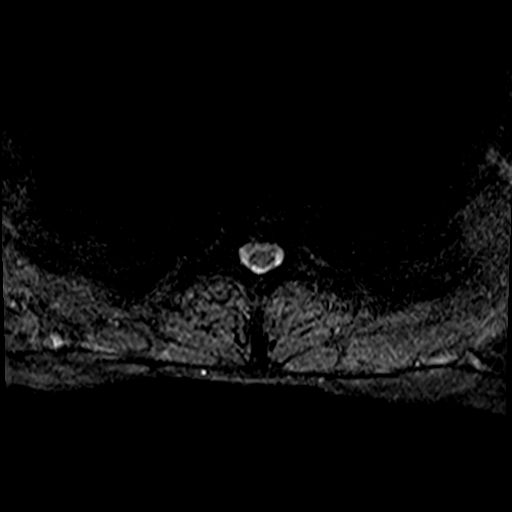
[im 5/30]
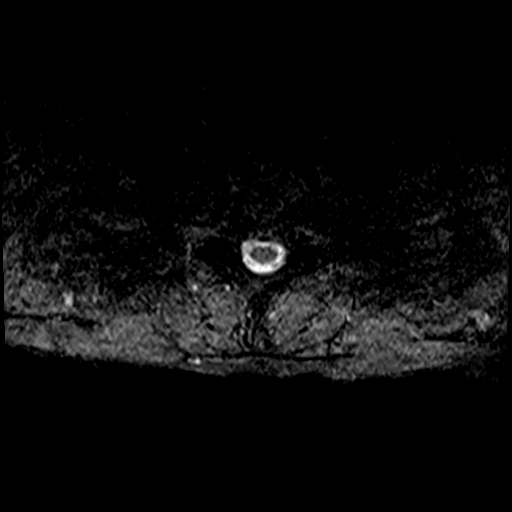
[im 9/30]
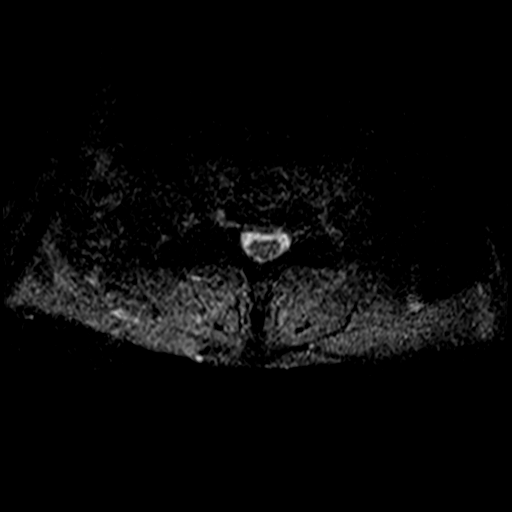
[im 14/30]
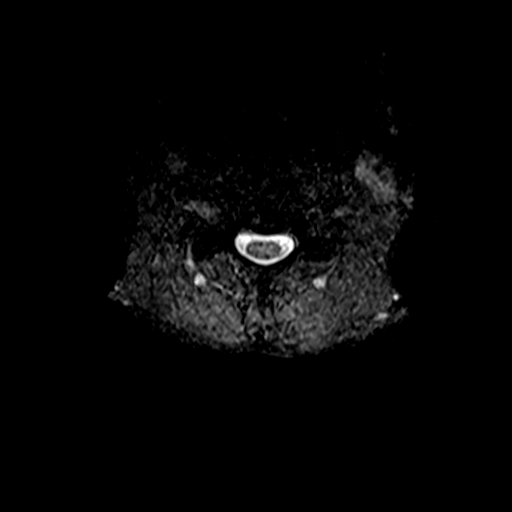
[im 16/30]
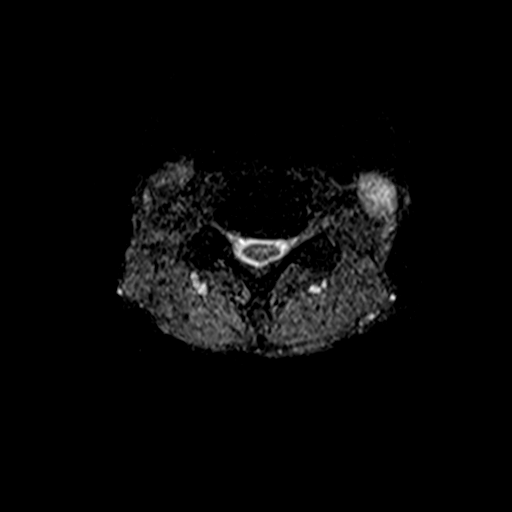
[im 21/30]
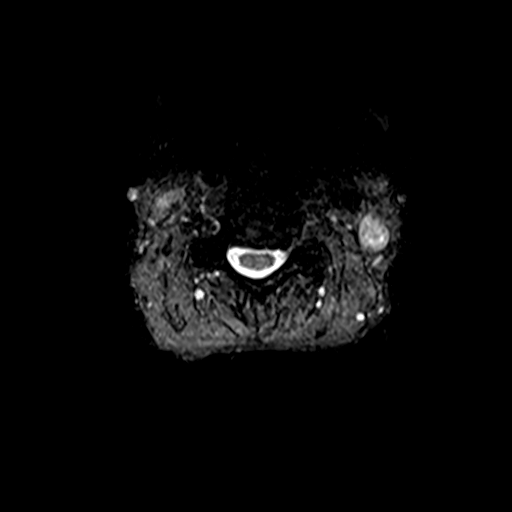
[im 25/30]
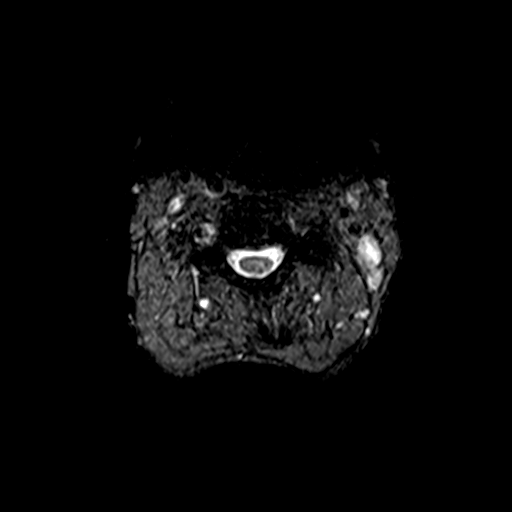

[37 of 48 positions shown; findings below may reference images not displayed]

FINDINGS: Alignment: Multilevel spondylolisthesis in the cervical and visible
upper thoracic spine. 2 mm of anterolisthesis of C3 on C4. 2-3 mm of
anterolisthesis of C7 on T1. Similar anterolisthesis of T2 on T3.

Vertebrae: Widespread degenerative endplate marrow signal changes.
Degenerative appearing sclerosis of the T1 body. No marrow edema or
evidence of acute osseous abnormality. Background bone marrow signal
appears normal.

Cord: No spinal cord signal abnormality despite some degenerative
cord mass effect detailed below.

Posterior Fossa, vertebral arteries, paraspinal tissues:
Cervicomedullary junction is within normal limits. Negative visible
posterior fossa. Preserved major vascular flow voids in the neck.
Negative visible neck soft tissues and lung apices.

Disc levels:

C2-C3: Mild facet and ligament flavum hypertrophy. Otherwise
negative.

C3-C4: Anterolisthesis with mild disc space loss but moderate
circumferential disc bulge or pseudo disc. Broad-based posterior
component. Mild to moderate facet hypertrophy greater on the left.
Mild spinal stenosis and mild spinal cord mass effect. Moderate to
severe bilateral C4 foraminal stenosis.

C4-C5: Advanced disc space loss. Circumferential although mostly
anterior disc osteophyte complex. Broad-based posterior component
with mild facet hypertrophy. Mild spinal stenosis and mild left hemi
cord mass effect. Moderate to severe bilateral C5 foraminal
stenosis.

C5-C6: Advanced disc space loss with circumferential disc osteophyte
complex. Broad-based posterior component. Borderline to mild spinal
stenosis, no cord mass effect. Mild left and moderate right C6
foraminal stenosis.

C6-C7: Disc space loss with circumferential disc osteophyte complex.
Broad-based posterior component. Borderline to mild spinal stenosis.
Mild if any cord mass effect. Mild to moderate bilateral C7
foraminal stenosis greater on the left.

C7-T1: Anterolisthesis. Mild disc space loss with endplate spurring.
Moderate facet and ligament flavum hypertrophy. Borderline to mild
spinal stenosis. No cord mass effect. Mild left, moderate to severe
right C8 foraminal stenosis.

Upper thoracic spondylolisthesis and disc space loss with disc
bulging or protrusion. Mild visible upper thoracic spinal stenosis
at T2-T3. Moderate to severe left and moderate right T1 neural
foraminal stenosis.
IMPRESSION: 1. No acute osseous abnormality. Multilevel degenerative
spondylolisthesis in the cervical and visible upper thoracic spine.
2. Widespread cervical disc and endplate degeneration. Up to
moderate cervical facet hypertrophy most pronounced at C3-C4.
Multilevel
Mild spinal stenosis, with up to mild associated cord mass effect at
C3-C4, C4-C5. No cord signal abnormality.
3. Moderate or severe degenerative neural foraminal stenosis at the
bilateral C4, C5, right C6, right C8 and left T1 nerve levels.

## 2021-02-20 ENCOUNTER — Other Ambulatory Visit: Payer: Self-pay | Admitting: Family Medicine

## 2021-02-20 DIAGNOSIS — M5412 Radiculopathy, cervical region: Secondary | ICD-10-CM

## 2021-02-25 ENCOUNTER — Ambulatory Visit
Admission: RE | Admit: 2021-02-25 | Discharge: 2021-02-25 | Disposition: A | Discharge status: Home / Self Care | Payer: Medicare Other | Source: Ambulatory Visit | Attending: Family Medicine | Admitting: Family Medicine

## 2021-02-25 ENCOUNTER — Other Ambulatory Visit: Payer: Self-pay

## 2021-02-25 DIAGNOSIS — M5412 Radiculopathy, cervical region: Secondary | ICD-10-CM

## 2021-02-25 IMAGING — XA DG INJECT/[PERSON_NAME] INC NEEDLE/CATH/PLC EPI/CERV/THOR W/IMG
2 series · 2 of 2 positions shown · non-contrast
Comparison: none

CLINICAL DATA: Cervical spondylosis without myelopathy. Cervical
radiculitis. Neck, right shoulder, and right arm pain with
numbness/tingling.

[Series 1: ortho standard · 1 of 1 slices shown (1 of 2)]
[im 1/1]
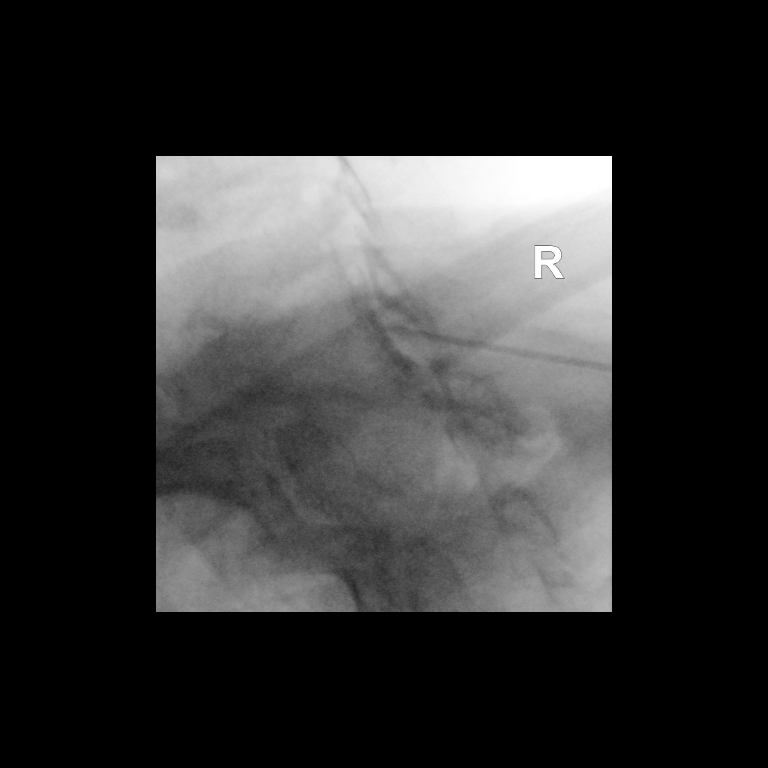

[Series 2: ortho standard · 1 of 1 slices shown (2 of 2)]
[im 1/1]
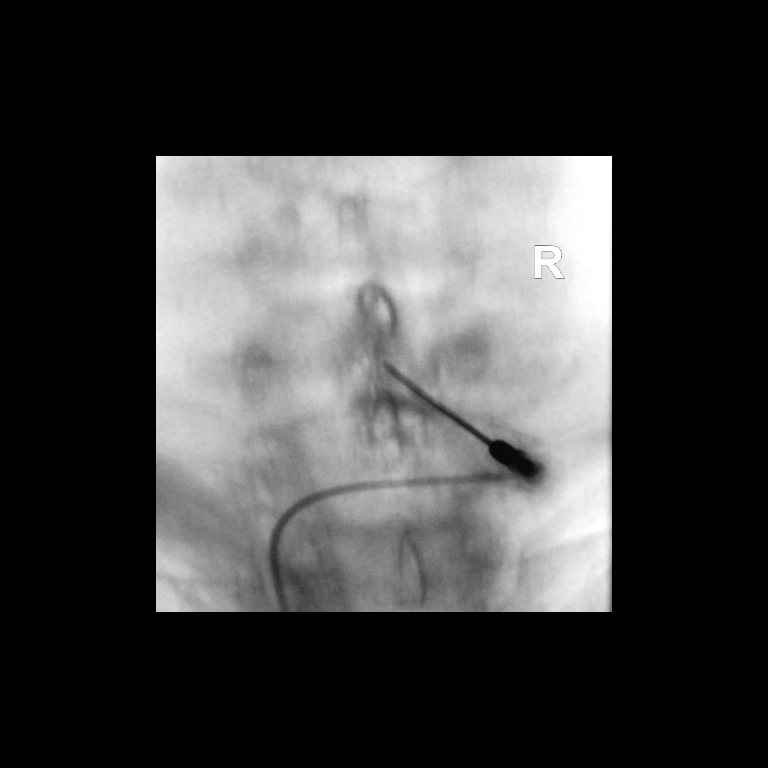

[2 of 2 positions shown; findings below may reference images not displayed]

FLUOROSCOPY:
Radiation Exposure Index (as provided by the fluoroscopic device):
2.50 mGy Kerma

PROCEDURE:
The procedure, risks, benefits, and alternatives were explained to
the patient. Questions regarding the procedure were encouraged and
answered. The patient understands and consents to the procedure.

CERVICAL EPIDURAL INJECTION

An interlaminar approach was performed on the right at C7-T1. A
inch 20 gauge epidural needle was advanced using loss-of-resistance
technique.

DIAGNOSTIC EPIDURAL INJECTION

Injection of Isovue-M 300 shows a good epidural pattern with spread
above and below the level of needle placement, primarily on the
right. No vascular opacification is seen.

THERAPEUTIC EPIDURAL INJECTION

1.5 ml of Kenalog 40 mixed with 2 ml of normal saline were then
instilled. The procedure was well-tolerated, and the patient was
discharged thirty minutes following the injection in good condition.
IMPRESSION: Technically successful interlaminar epidural injection on the right
at C7-T1.

## 2021-02-25 MED ORDER — IOPAMIDOL (ISOVUE-M 300) INJECTION 61%
1.0000 mL | Freq: Once | INTRAMUSCULAR | Status: AC
  Administered 2021-02-25: 1 mL via EPIDURAL

## 2021-02-25 MED ORDER — TRIAMCINOLONE ACETONIDE 40 MG/ML IJ SUSP (RADIOLOGY)
60.0000 mg | Freq: Once | INTRAMUSCULAR | Status: AC
  Administered 2021-02-25: 60 mg via EPIDURAL

## 2021-02-25 NOTE — Discharge Instructions (Signed)

## 2021-03-25 ENCOUNTER — Emergency Department: Payer: Medicare Other

## 2021-03-25 ENCOUNTER — Emergency Department
Admission: EM | Admit: 2021-03-25 | Discharge: 2021-03-25 | Disposition: A | Discharge status: Home / Self Care | Payer: Medicare Other | Attending: Emergency Medicine | Admitting: Emergency Medicine

## 2021-03-25 ENCOUNTER — Other Ambulatory Visit: Payer: Self-pay

## 2021-03-25 DIAGNOSIS — R110 Nausea: Secondary | ICD-10-CM | POA: Diagnosis not present

## 2021-03-25 DIAGNOSIS — E10649 Type 1 diabetes mellitus with hypoglycemia without coma: Secondary | ICD-10-CM | POA: Diagnosis not present

## 2021-03-25 DIAGNOSIS — J45909 Unspecified asthma, uncomplicated: Secondary | ICD-10-CM | POA: Diagnosis not present

## 2021-03-25 DIAGNOSIS — D72829 Elevated white blood cell count, unspecified: Secondary | ICD-10-CM | POA: Diagnosis not present

## 2021-03-25 DIAGNOSIS — W01198A Fall on same level from slipping, tripping and stumbling with subsequent striking against other object, initial encounter: Secondary | ICD-10-CM | POA: Insufficient documentation

## 2021-03-25 DIAGNOSIS — Z20822 Contact with and (suspected) exposure to covid-19: Secondary | ICD-10-CM | POA: Diagnosis not present

## 2021-03-25 DIAGNOSIS — R410 Disorientation, unspecified: Secondary | ICD-10-CM | POA: Diagnosis not present

## 2021-03-25 DIAGNOSIS — M542 Cervicalgia: Secondary | ICD-10-CM | POA: Insufficient documentation

## 2021-03-25 DIAGNOSIS — R42 Dizziness and giddiness: Secondary | ICD-10-CM | POA: Diagnosis present

## 2021-03-25 DIAGNOSIS — W19XXXA Unspecified fall, initial encounter: Secondary | ICD-10-CM

## 2021-03-25 LAB — BASIC METABOLIC PANEL
Anion gap: 11 (ref 5–15)
BUN: 17 mg/dL (ref 8–23)
CO2: 25 mmol/L (ref 22–32)
Calcium: 9.1 mg/dL (ref 8.9–10.3)
Chloride: 96 mmol/L — ABNORMAL LOW (ref 98–111)
Creatinine, Ser: 0.7 mg/dL (ref 0.44–1.00)
GFR, Estimated: >60 mL/min (ref >60)
Glucose, Bld: 131 mg/dL — ABNORMAL HIGH (ref 70–99)
Potassium: 4 mmol/L (ref 3.5–5.1)
Sodium: 132 mmol/L — ABNORMAL LOW (ref 135–145)

## 2021-03-25 LAB — CBG MONITORING, ED
Glucose-Capillary: 69 mg/dL — ABNORMAL LOW (ref 70–99)
Glucose-Capillary: 145 mg/dL — ABNORMAL HIGH (ref 70–99)
Glucose-Capillary: 139 mg/dL — ABNORMAL HIGH (ref 70–99)

## 2021-03-25 LAB — CBC WITH DIFFERENTIAL/PLATELET
Abs Immature Granulocytes: 0.07 10*3/uL (ref 0.00–0.07)
Basophils Absolute: 0.1 10*3/uL (ref 0.0–0.1)
Basophils Relative: 1 %
Eosinophils Absolute: 0.1 10*3/uL (ref 0.0–0.5)
Eosinophils Relative: 1 %
HCT: 41.6 % (ref 36.0–46.0)
Hemoglobin: 13.4 g/dL (ref 12.0–15.0)
Immature Granulocytes: 1 %
Lymphocytes Relative: 19 %
Lymphs Abs: 2.1 10*3/uL (ref 0.7–4.0)
MCH: 30.7 pg (ref 26.0–34.0)
MCHC: 32.2 g/dL (ref 30.0–36.0)
MCV: 95.4 fL (ref 80.0–100.0)
Monocytes Absolute: 0.7 10*3/uL (ref 0.1–1.0)
Monocytes Relative: 6 %
Neutro Abs: 8.5 10*3/uL — ABNORMAL HIGH (ref 1.7–7.7)
Neutrophils Relative %: 72 %
Platelets: 279 10*3/uL (ref 150–400)
RBC: 4.36 MIL/uL (ref 3.87–5.11)
RDW: 14.9 % (ref 11.5–15.5)
WBC: 11.5 10*3/uL — ABNORMAL HIGH (ref 4.0–10.5)
nRBC: 0 % (ref 0.0–0.2)

## 2021-03-25 LAB — RESP PANEL BY RT-PCR (FLU A&B, COVID) ARPGX2
Influenza A by PCR: NEGATIVE
Influenza B by PCR: NEGATIVE
SARS Coronavirus 2 by RT PCR: NEGATIVE

## 2021-03-25 LAB — URINALYSIS, ROUTINE W REFLEX MICROSCOPIC
Bilirubin Urine: NEGATIVE
Glucose, UA: NEGATIVE mg/dL
Hgb urine dipstick: NEGATIVE
Ketones, ur: NEGATIVE mg/dL
Leukocytes,Ua: NEGATIVE
Nitrite: NEGATIVE
Protein, ur: NEGATIVE mg/dL
Specific Gravity, Urine: 1.011 (ref 1.005–1.030)
pH: 6 (ref 5.0–8.0)

## 2021-03-25 IMAGING — CT CT CERVICAL SPINE W/O CM
3 series · 13 of 27 positions shown, 16 images · non-contrast
Comparison: Head CT [DATE]

CLINICAL DATA: Patient fell and hit head on the floor.



[Series 3: c spine soft · axial · 0.40mm/px · z∈[-226,-166]mm · 3 of 91 slices shown]
[im 16/91  soft-tissue]
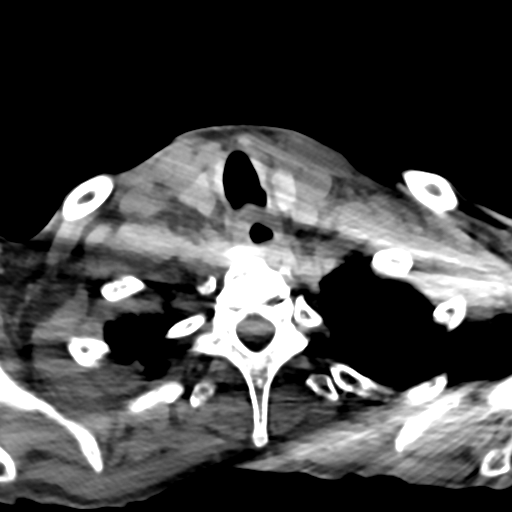
[im 31/91  soft-tissue]
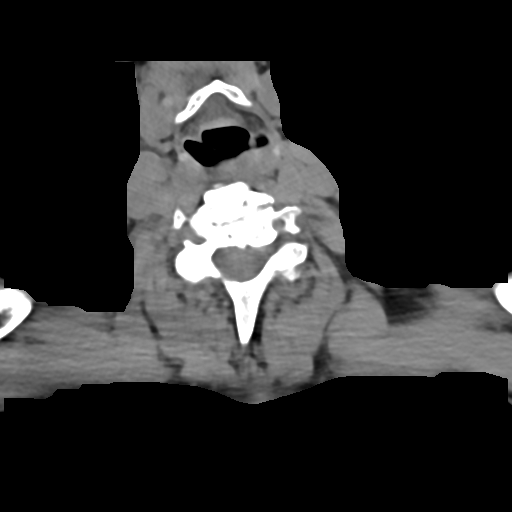
[im 46/91  soft-tissue]
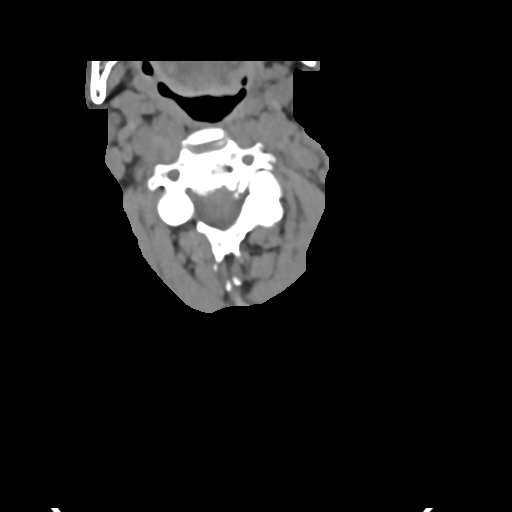

[Series 5: sag bone · sagittal · 0.29mm/px · 5 of 47 slices shown, 6 images]
[im 16/47  bone]
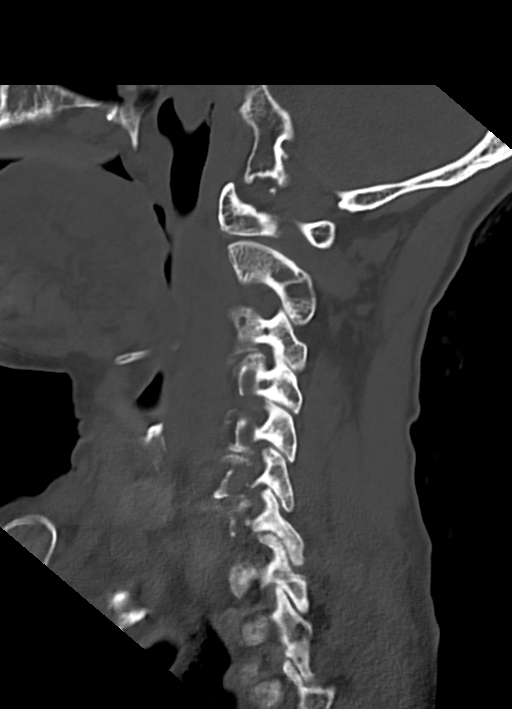
[im 20/47  bone]
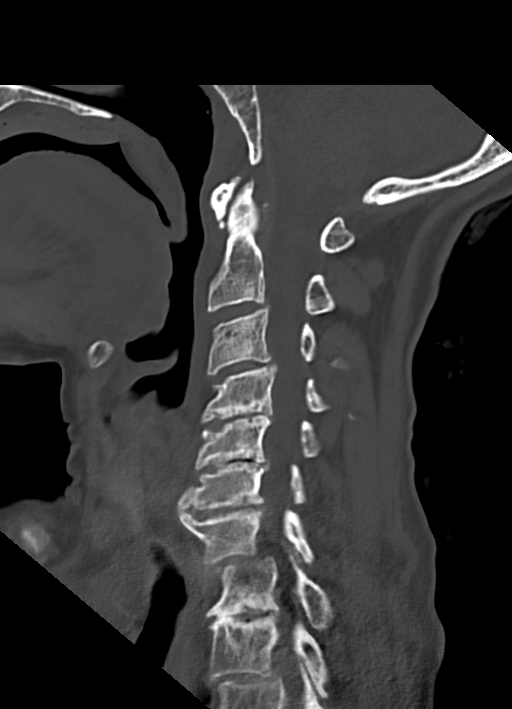
[im 24/47  soft-tissue]
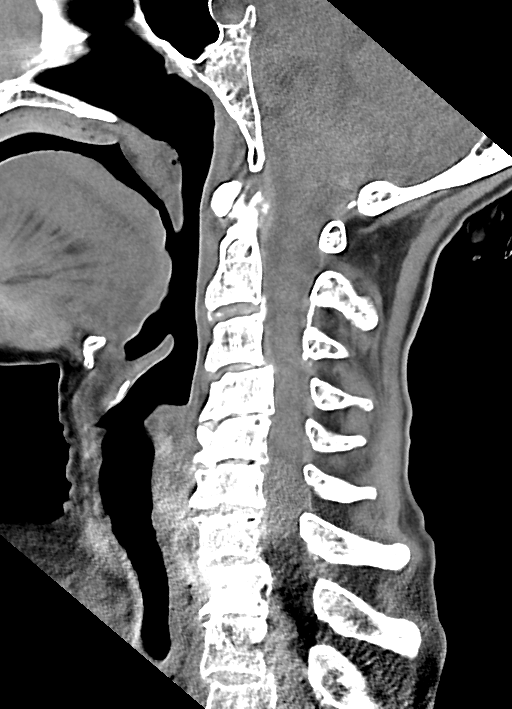
[im 24/47  bone]
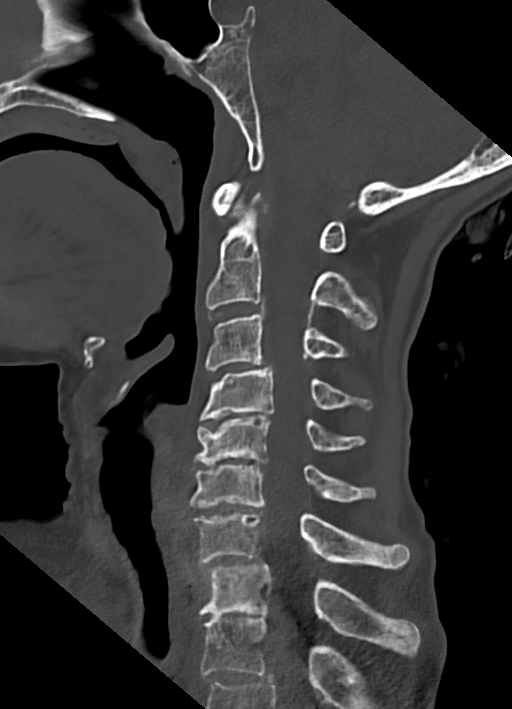
[im 27/47  bone]
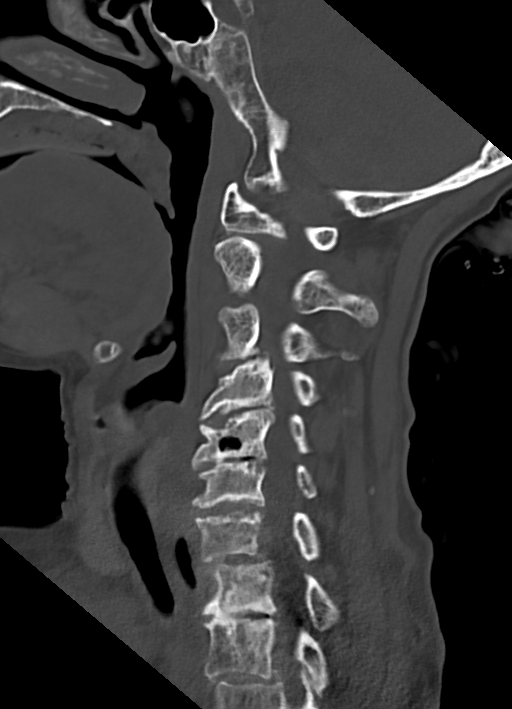
[im 31/47  bone]
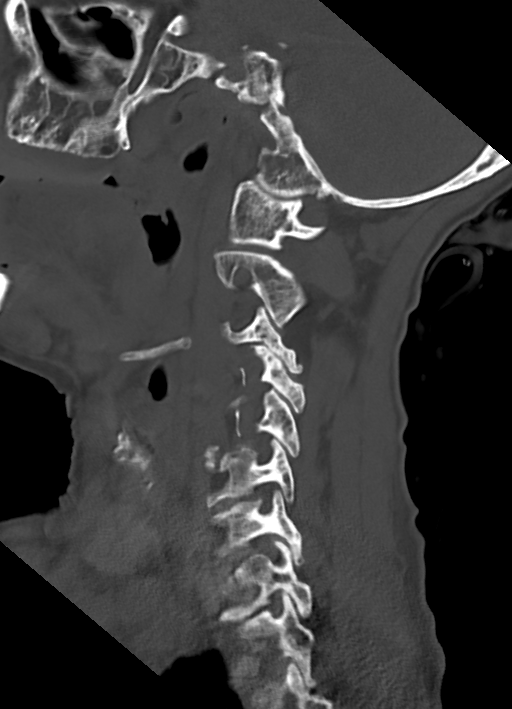

[Series 7: orthogonal axials · axial · 0.23mm/px · z∈[-246,-147]mm · 5 of 91 slices shown, 7 images]
[im 16/91  soft-tissue]
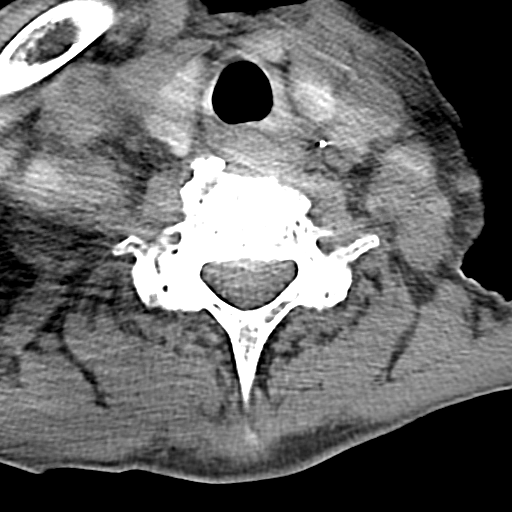
[im 16/91  bone]
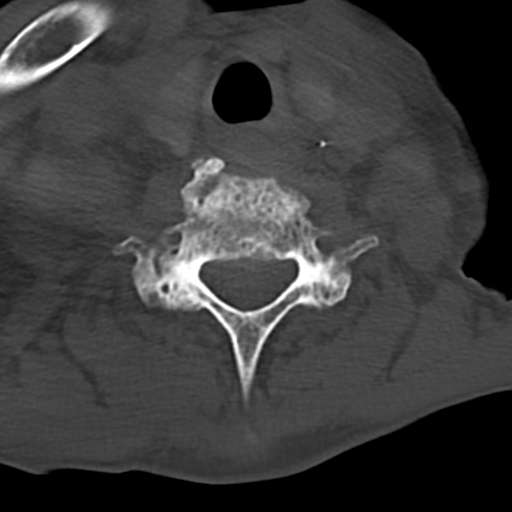
[im 31/91  bone]
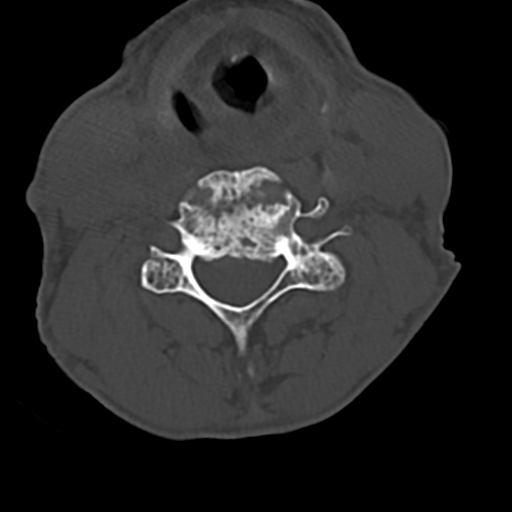
[im 46/91  bone]
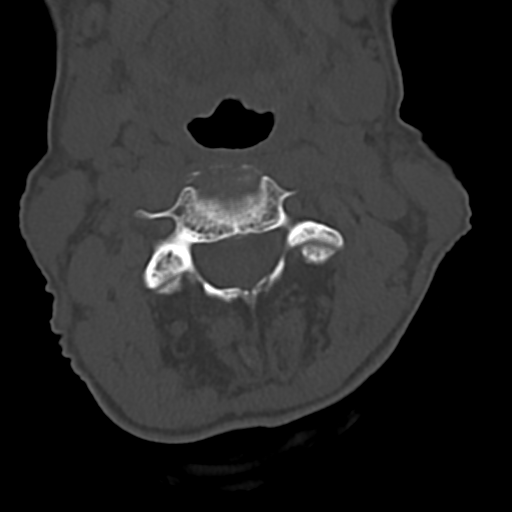
[im 61/91  bone]
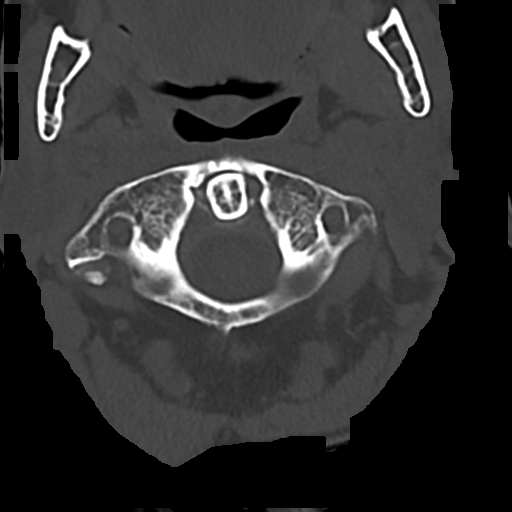
[im 76/91  soft-tissue]
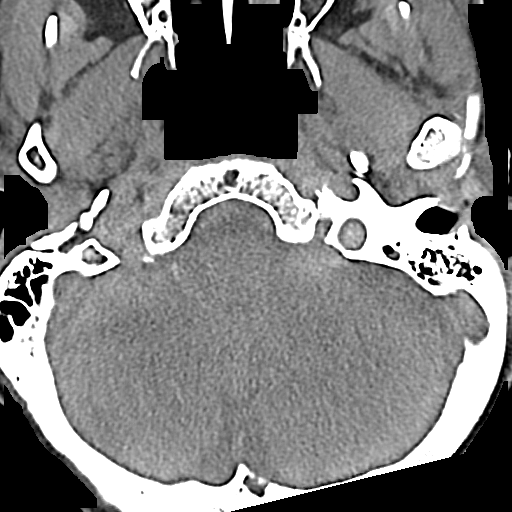
[im 76/91  bone]
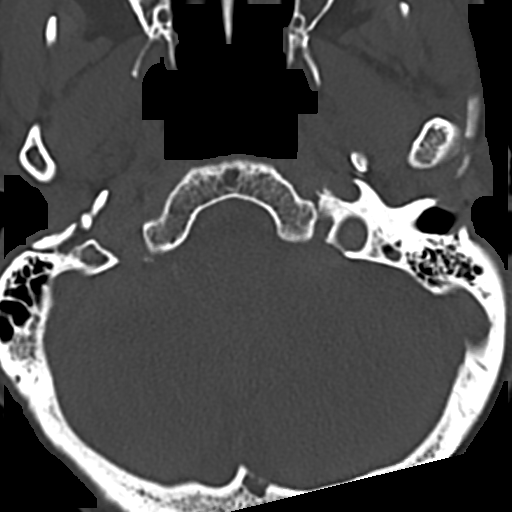

[13 of 27 positions shown; findings below may reference images not displayed]

FINDINGS: CT HEAD FINDINGS

Brain: There is no evidence for acute hemorrhage, hydrocephalus,
mass lesion, or abnormal extra-axial fluid collection. No definite
CT evidence for acute infarction. Chronic post ischemic change noted
left insular region, stable. Diffuse loss of parenchymal volume is
consistent with atrophy.

Vascular: No hyperdense vessel or unexpected calcification.

Skull: No evidence for fracture. No worrisome lytic or sclerotic
lesion.

Sinuses/Orbits: The visualized paranasal sinuses and mastoid air
cells are clear. Visualized portions of the globes and intraorbital
fat are unremarkable.

Other: None.

CT CERVICAL SPINE FINDINGS

Alignment: Trace anterolisthesis noted C3 on 4, compatible with
advanced left-sided facet degeneration at the same level. Trace
retrolisthesis of C4 on 5 and C5 on 6 likely related to the
substantial loss of disc height at both levels. Mild anterolisthesis
of C7 on T1 also likely degenerative.

Skull base and vertebrae: No acute fracture. No primary bone lesion
or focal pathologic process.

Soft tissues and spinal canal: No prevertebral fluid or swelling. No
visible canal hematoma.

Disc levels: Marked loss of disc height with endplate degeneration
noted C4-5, C5-6, and C6-7. Prominent loss of disc height also noted
T1-2.

Upper chest: Biapical pleuroparenchymal scarring.

Other: None.
IMPRESSION: 1. No acute intracranial abnormality.  Diffuse atrophy again noted.
2. No cervical spine fracture or traumatic subluxation.
3. Advanced degenerative changes in the cervical spine as above.

## 2021-03-25 IMAGING — CR DG CHEST 2V
1 series · 2 of 2 positions shown · non-contrast
Comparison: Chest radiographs [DATE]

CLINICAL DATA: Dizziness.  Chest congestion.

EXAM:
CHEST - 2 VIEW

[Series 1: dg chest 2 view · 0.14mm/px · 2 of 2 slices shown]
[im 1/2]
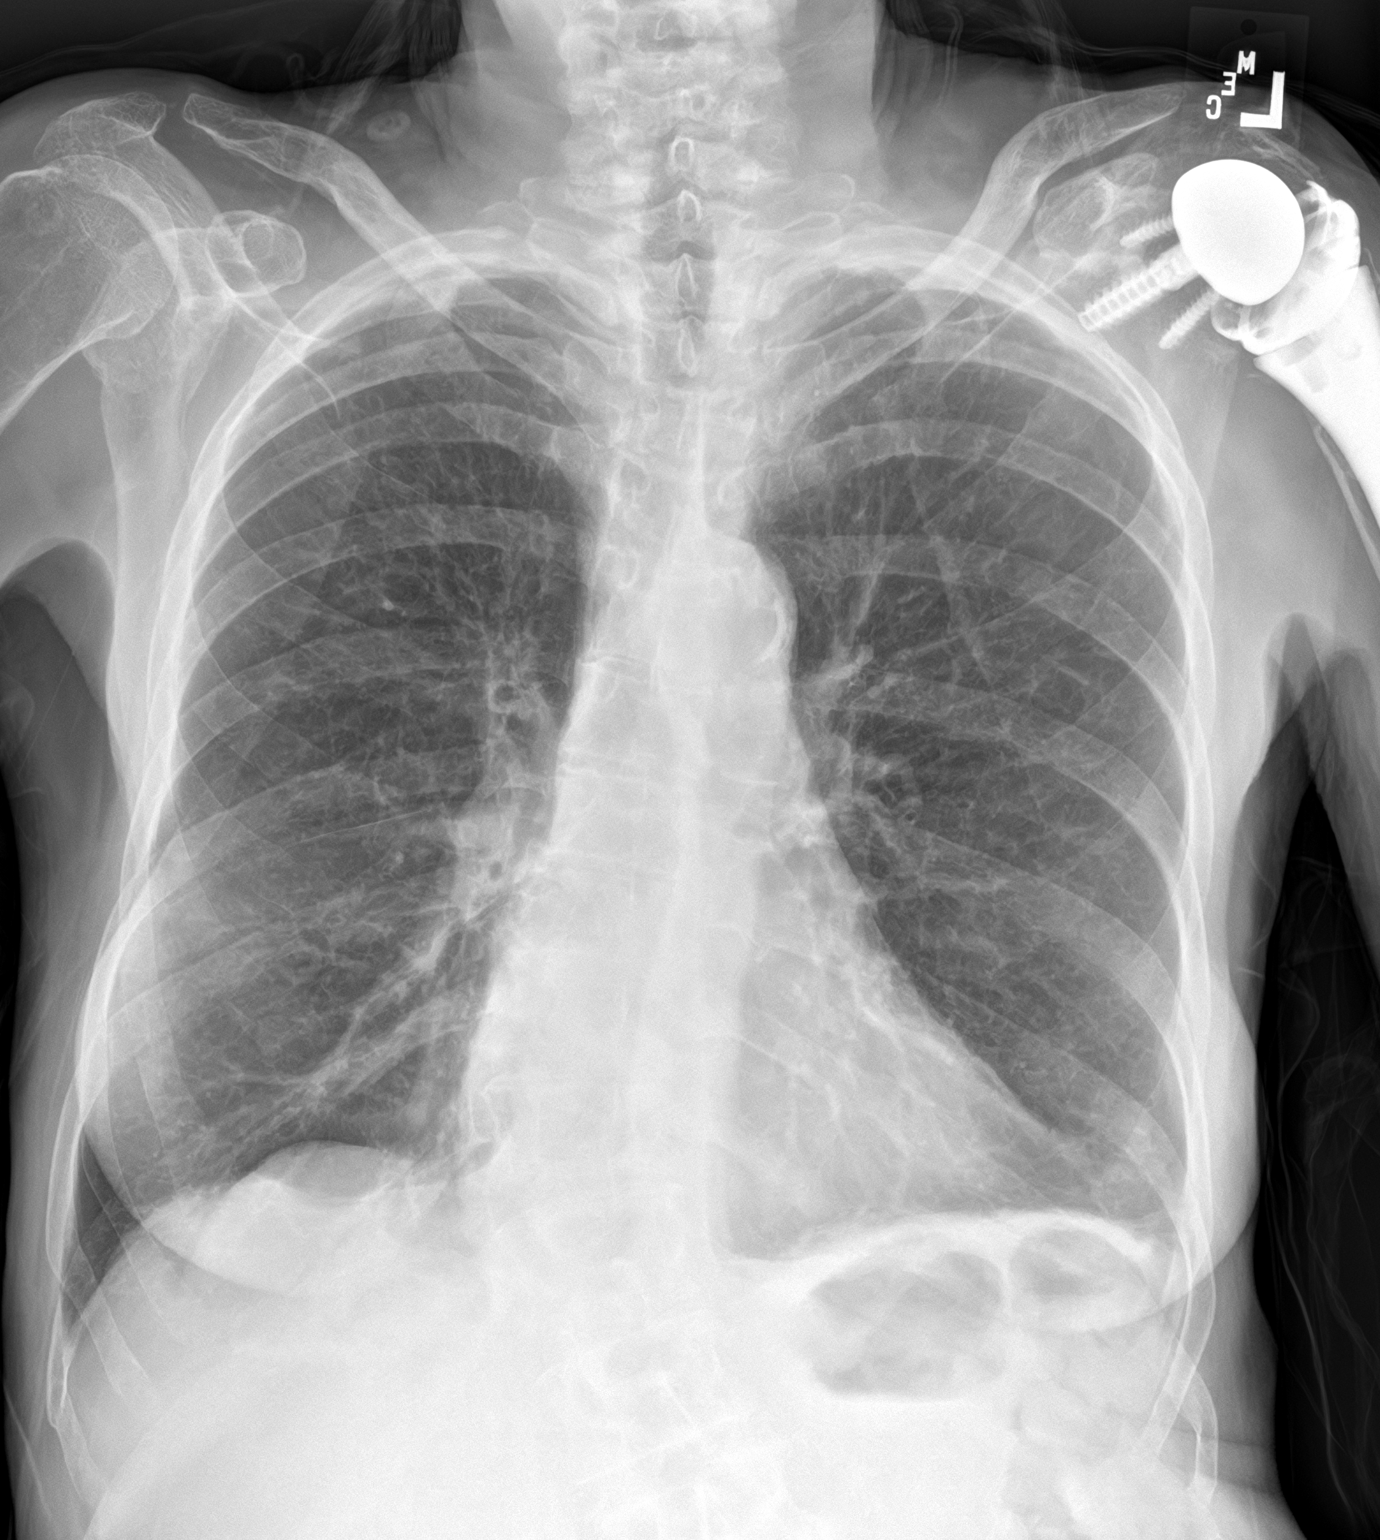
[im 2/2]
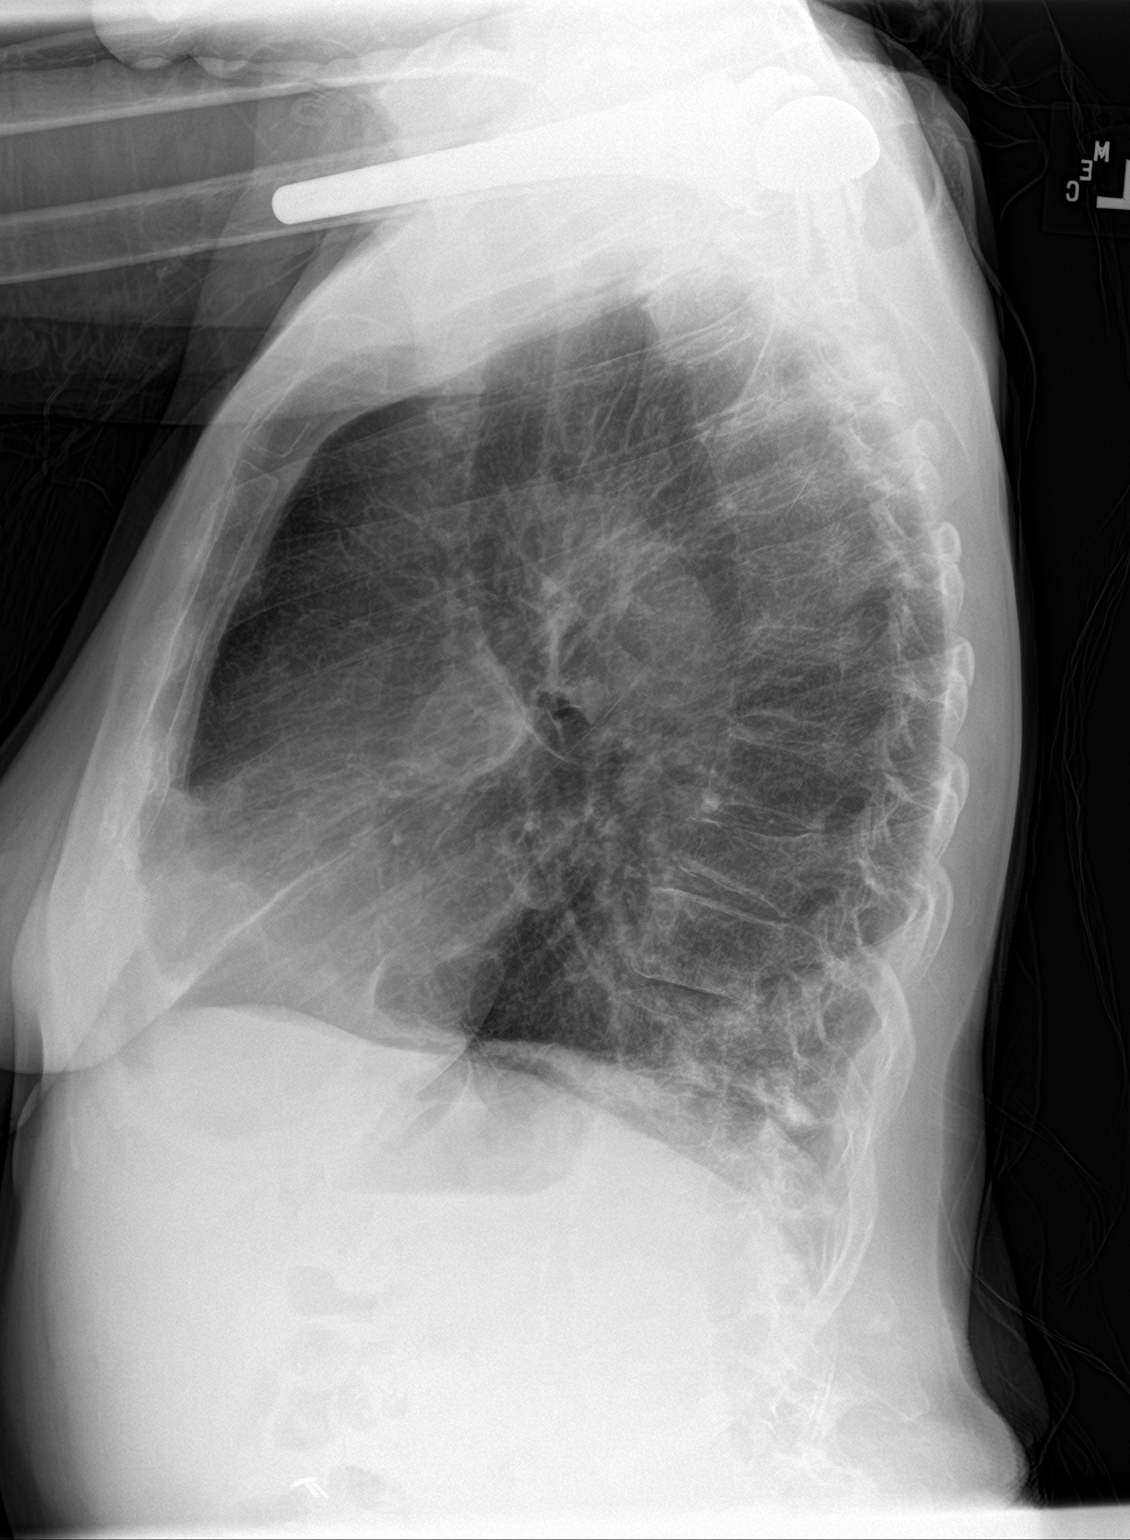

[2 of 2 positions shown; findings below may reference images not displayed]

FINDINGS: Cardiac silhouette and mediastinal contours are within normal limits
with moderate calcification again seen within the aortic arch. There
is flattening of the diaphragms and moderate hyperinflation with
increased lucencies again suggesting chronic hyperinflation. Basilar
linear likely scarring appears unchanged from prior. No new definite
focal airspace opacity. No pneumothorax. No pleural effusion.
Unchanged moderate height loss of a lower thoracic vertebral body.
IMPRESSION: Chronic hyperinflation and emphysematous changes. Bibasilar
scarring. No definite acute lung process.

## 2021-03-25 IMAGING — CT CT HEAD W/O CM
4 series · 15 of 47 positions shown, 17 images · non-contrast
Comparison: Head CT [DATE]

CLINICAL DATA: Patient fell and hit head on the floor.



[Series 2: head wo · axial · 0.42mm/px · z∈[-111,-1]mm · 7 of 30 slices shown, 9 images]
[im 4/30  brain]
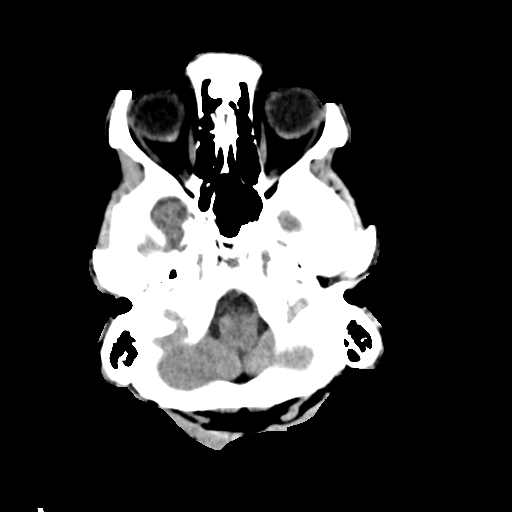
[im 4/30  bone]
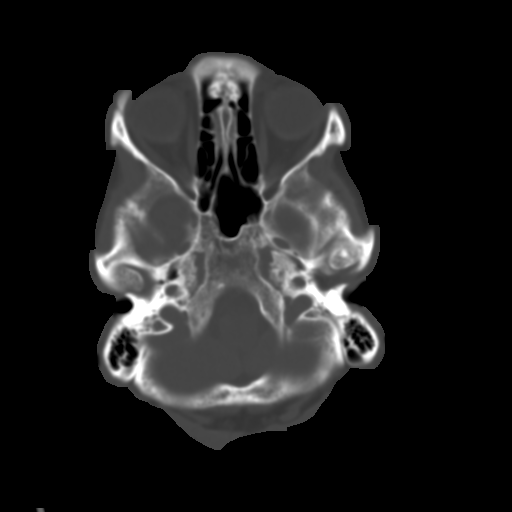
[im 8/30  brain]
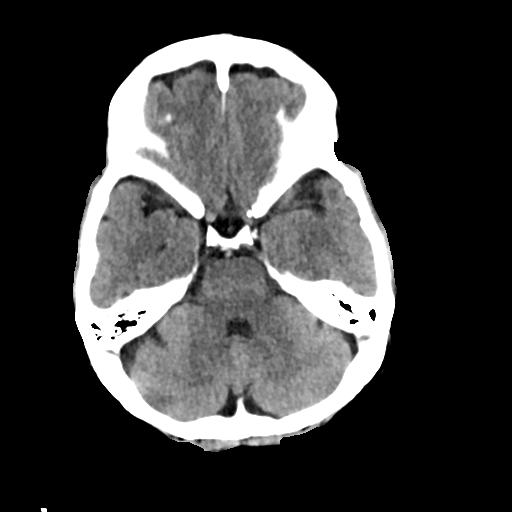
[im 11/30  brain]
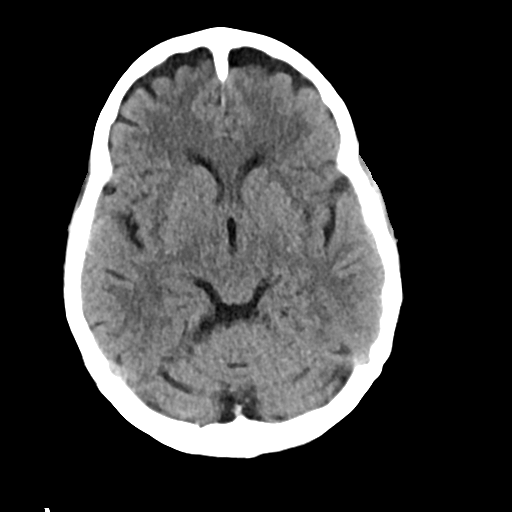
[im 15/30  brain]
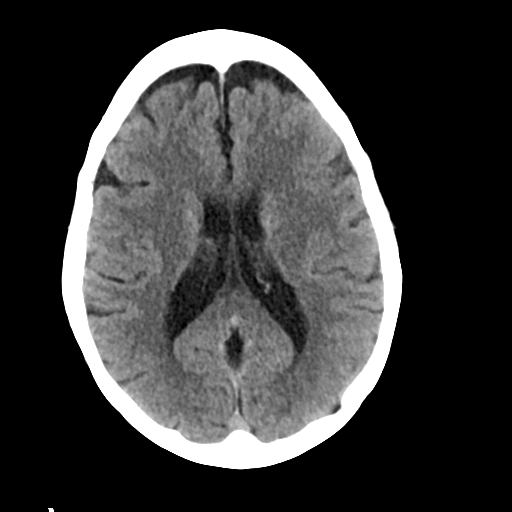
[im 19/30  brain]
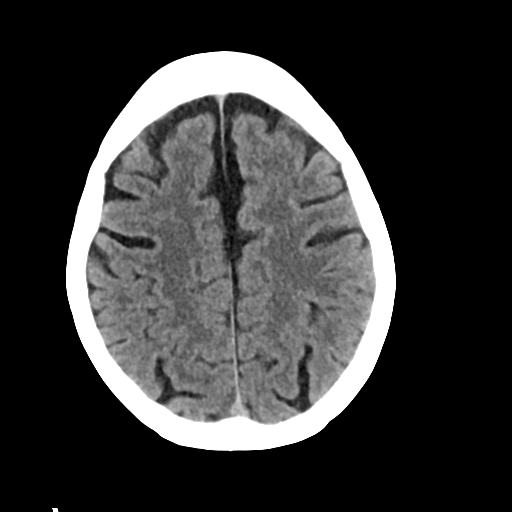
[im 19/30  bone]
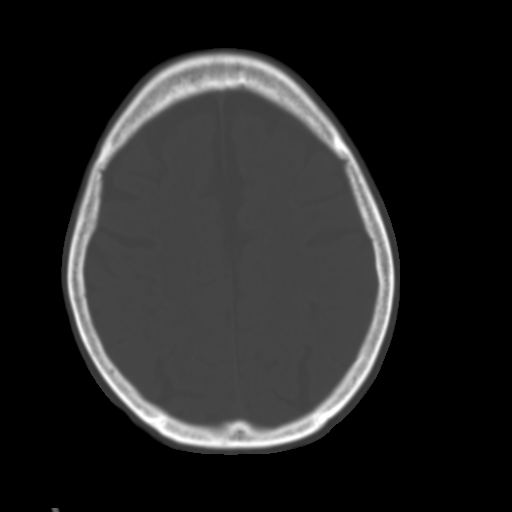
[im 22/30  brain]
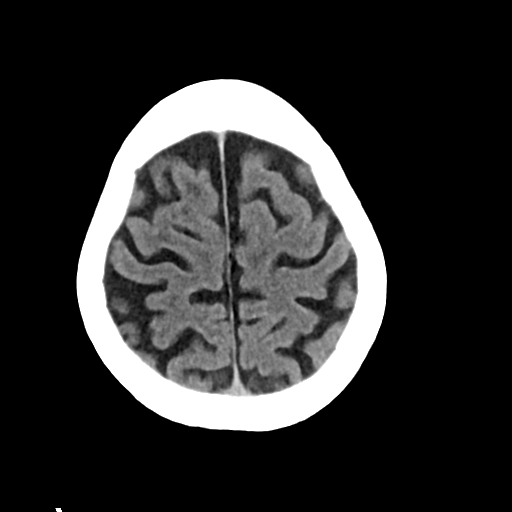
[im 26/30  brain]
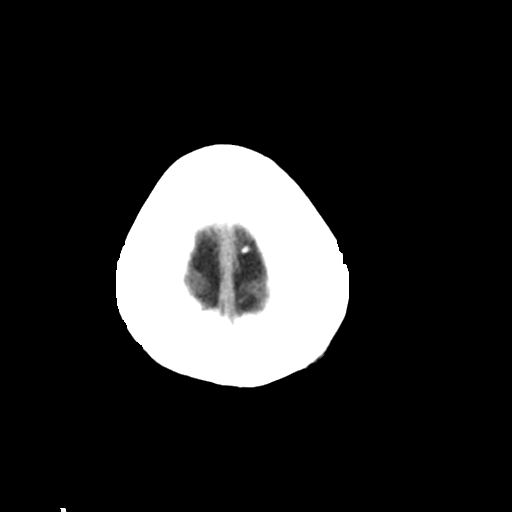

[Series 3: head bone · axial · 0.42mm/px · z∈[-112,-98]mm · 2 of 74 slices shown]
[im 8/74  bone]
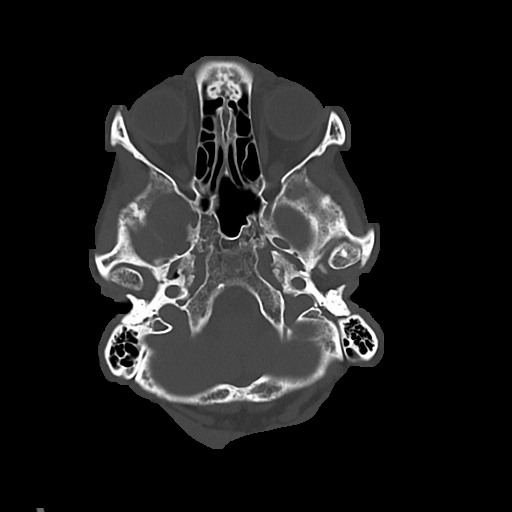
[im 15/74  bone]
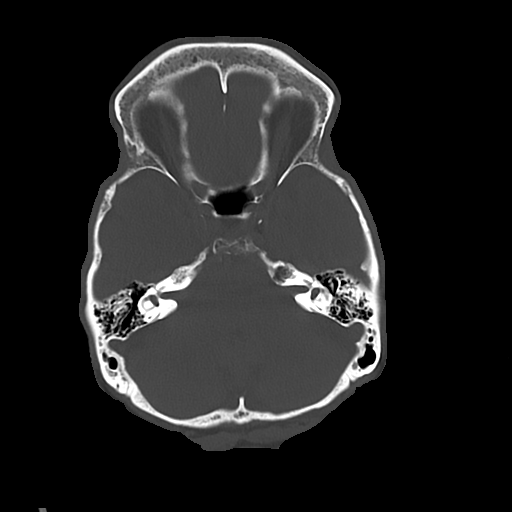

[Series 4: cor soft · coronal · 0.31mm/px · 3 of 64 slices shown]
[im 22/64  brain]
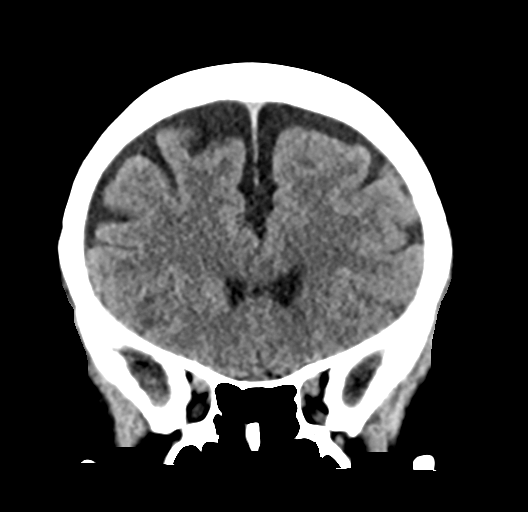
[im 29/64  brain]
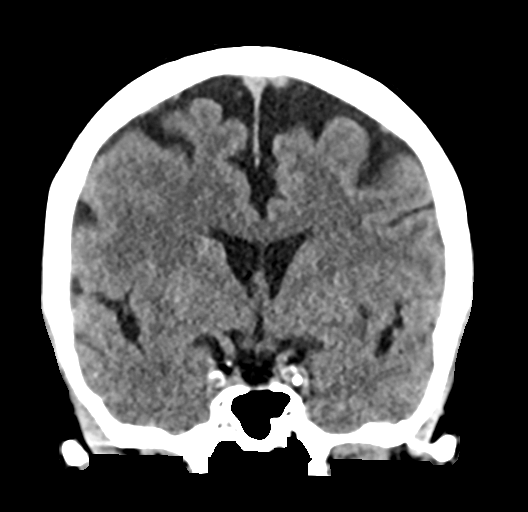
[im 36/64  brain]
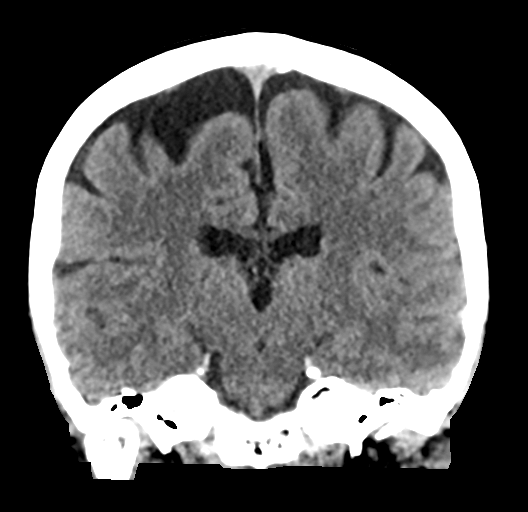

[Series 5: sag soft · sagittal · 0.31mm/px · 3 of 54 slices shown]
[im 18/54  brain]
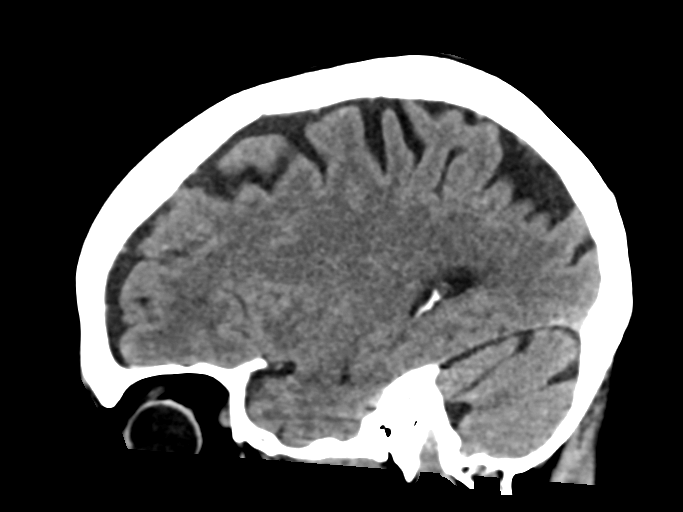
[im 27/54  brain]
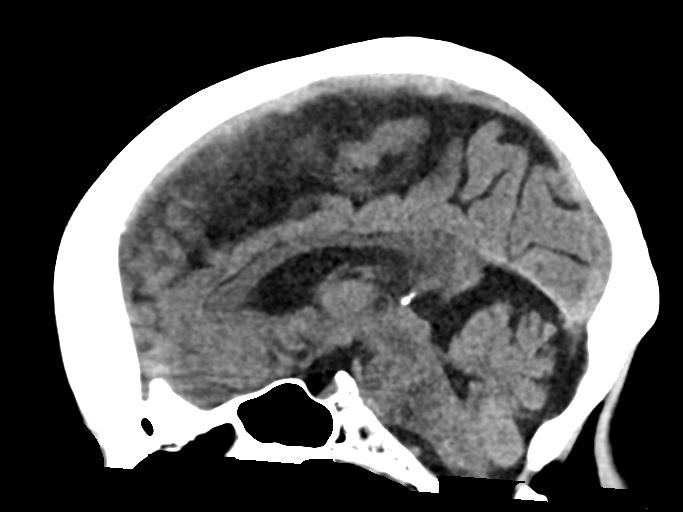
[im 36/54  brain]
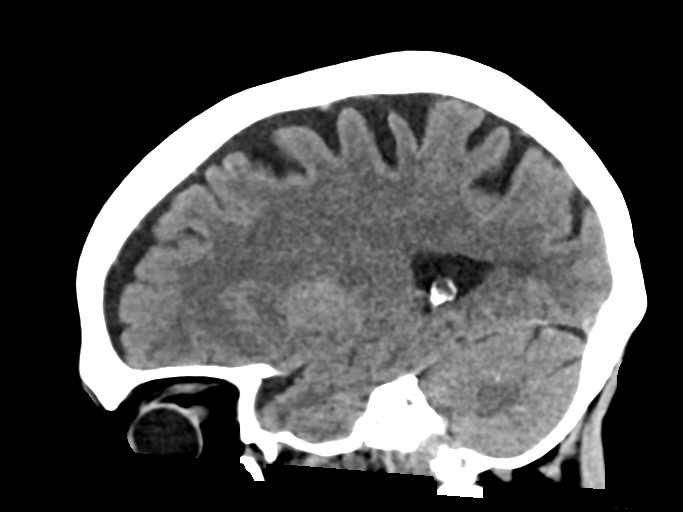

[15 of 47 positions shown; findings below may reference images not displayed]

FINDINGS: CT HEAD FINDINGS

Brain: There is no evidence for acute hemorrhage, hydrocephalus,
mass lesion, or abnormal extra-axial fluid collection. No definite
CT evidence for acute infarction. Chronic post ischemic change noted
left insular region, stable. Diffuse loss of parenchymal volume is
consistent with atrophy.

Vascular: No hyperdense vessel or unexpected calcification.

Skull: No evidence for fracture. No worrisome lytic or sclerotic
lesion.

Sinuses/Orbits: The visualized paranasal sinuses and mastoid air
cells are clear. Visualized portions of the globes and intraorbital
fat are unremarkable.

Other: None.

CT CERVICAL SPINE FINDINGS

Alignment: Trace anterolisthesis noted C3 on 4, compatible with
advanced left-sided facet degeneration at the same level. Trace
retrolisthesis of C4 on 5 and C5 on 6 likely related to the
substantial loss of disc height at both levels. Mild anterolisthesis
of C7 on T1 also likely degenerative.

Skull base and vertebrae: No acute fracture. No primary bone lesion
or focal pathologic process.

Soft tissues and spinal canal: No prevertebral fluid or swelling. No
visible canal hematoma.

Disc levels: Marked loss of disc height with endplate degeneration
noted C4-5, C5-6, and C6-7. Prominent loss of disc height also noted
T1-2.

Upper chest: Biapical pleuroparenchymal scarring.

Other: None.
IMPRESSION: 1. No acute intracranial abnormality.  Diffuse atrophy again noted.
2. No cervical spine fracture or traumatic subluxation.
3. Advanced degenerative changes in the cervical spine as above.

## 2021-03-25 MED ORDER — SODIUM CHLORIDE 0.9 % IV BOLUS
500.0000 mL | Freq: Once | INTRAVENOUS | Status: AC
  Administered 2021-03-25: 500 mL via INTRAVENOUS

## 2021-03-25 MED ORDER — OXYCODONE HCL 5 MG PO TABS: 5.0000 mg | ORAL_TABLET | Freq: Three times a day (TID) | ORAL | 0 refills | Status: AC | PRN

## 2021-03-25 MED ORDER — OXYCODONE HCL 5 MG PO TABS
5.0000 mg | ORAL_TABLET | Freq: Once | ORAL | Status: AC
  Administered 2021-03-25: 5 mg via ORAL
  Filled 2021-03-25: qty 1

## 2021-03-25 MED ORDER — ONDANSETRON HCL 4 MG/2ML IJ SOLN
4.0000 mg | Freq: Once | INTRAMUSCULAR | Status: AC
  Administered 2021-03-25: 4 mg via INTRAVENOUS
  Filled 2021-03-25: qty 2

## 2021-03-25 NOTE — ED Provider Notes (Signed)
? ?[facility] ?Provider Note ? ? ? Event Date/Time  ? First MD Initiated Contact with Patient 03/25/21 1610   ?  (approximate) ? ? ?History  ? ?Chief Complaint ?Fall ( ) and Hypoglycemia ? ? ?HPI ?Suzanne Snyder is a 71 y.o. female, history of type 1 diabetes, anxiety, osteoporosis, asthma, presents to the emergency department for evaluation of fall.  Patient states that she was walking from 1 room to another when she suddenly started to feel very dizzy, causing her to collapse.  She states that she is having difficulty remembering the event and is unsure if she lost consciousness.  Her husband was in the house, but did not witness the fall.  He states that she was awake when he found her, however appeared confused.  Patient currently endorsing headache and mild neck pain.  She states that she is slowly starting to regain some of her memory of the event.  She states that she believes her blood sugar may have been too low.  Endorses some nausea and cold symptoms with chest congestion, otherwise no other previous symptoms.  Denies fever/chills, chest pain, shortness of breath, numbness/tingling in upper or lower extremities, dysuria, vomiting, or diarrhea.   ? ?History Limitations: No limitations. ? ?  ? ? ?Physical Exam  ?Triage Vital Signs: ?ED Triage Vitals  ?Enc Vitals Group  ?   BP 03/25/21 1600 132/86  ?   Pulse Rate 03/25/21 1600 (!) 131  ?   Resp 03/25/21 1600 16  ?   Temp 03/25/21 1600 (!) 97.5 ?F (36.4 ?C)  ?   Temp Source 03/25/21 1600 Oral  ?   SpO2 03/25/21 1600 99 %  ?   Weight --   ?   Height 03/25/21 1601 5\' 9"  (1.753 m)  ?   Head Circumference --   ?   Peak Flow --   ?   Pain Score 03/25/21 1601 0  ?   Pain Loc --   ?   Pain Edu? --   ?   Excl. in GC? --   ? ? ?Most recent vital signs: ?Vitals:  ? 03/25/21 1700 03/25/21 2004  ?BP:  (!) 141/77  ?Pulse: 76 69  ?Resp:  20  ?Temp:    ?SpO2: 95% 98%  ? ? ?General: Awake, appears lethargic. ?CV: Good peripheral perfusion.  ?Resp:  Normal effort.  Mild wheezing/rhonchi bilaterally. ?Abd: Soft, non-tender. No distention.  ?Neuro: At baseline. No gross neurological deficits.  Cranial nerves II through XII intact.  5/5 strength in upper and lower extremities.  Pulse, motor, sensation intact in all extremities.  Cerebellar function intact. ?Other: No contusions or deformities present along the head.  Normal range of motion of the neck.  PERRL, EOMI. ? ?Physical Exam ? ? ? ?ED Results / Procedures / Treatments  ?Labs ?(all labs ordered are listed, but only abnormal results are displayed) ?Labs Reviewed  ?BASIC METABOLIC PANEL - Abnormal; Notable for the following components:  ?    Result Value  ? Sodium 132 (*)   ? Chloride 96 (*)   ? Glucose, Bld 131 (*)   ? All other components within normal limits  ?CBC WITH DIFFERENTIAL/PLATELET - Abnormal; Notable for the following components:  ? WBC 11.5 (*)   ? Neutro Abs 8.5 (*)   ? All other components within normal limits  ?URINALYSIS, ROUTINE W REFLEX MICROSCOPIC - Abnormal; Notable for the following components:  ? Color, Urine YELLOW (*)   ? APPearance  CLEAR (*)   ? All other components within normal limits  ?CBG MONITORING, ED - Abnormal; Notable for the following components:  ? Glucose-Capillary 69 (*)   ? All other components within normal limits  ?CBG MONITORING, ED - Abnormal; Notable for the following components:  ? Glucose-Capillary 145 (*)   ? All other components within normal limits  ?CBG MONITORING, ED - Abnormal; Notable for the following components:  ? Glucose-Capillary 139 (*)   ? All other components within normal limits  ?RESP PANEL BY RT-PCR (FLU A&B, COVID) [id number]  ? ? ? ?EKG ?Sinus rhythm with sinus arrhythmia, rate of 74, no ST segment changes, no axis deviations, no AV blocks, normal QRS interval. ? ? ?RADIOLOGY ? ?ED Provider Interpretation: I personally reviewed and interpreted these images.  Head CT shows no acute intracranial abnormalities.  CT cervical spine shows no acute  fractures. ? ?DG Chest 2 View ? ?Result Date: 03/25/2021 ?CLINICAL DATA:  Dizziness.  Chest congestion. EXAM: CHEST - 2 VIEW COMPARISON:  Chest radiographs 01/19/2018 FINDINGS: Cardiac silhouette and mediastinal contours are within normal limits with moderate calcification again seen within the aortic arch. There is flattening of the diaphragms and moderate hyperinflation with increased lucencies again suggesting chronic hyperinflation. Basilar linear likely scarring appears unchanged from prior. No new definite focal airspace opacity. No pneumothorax. No pleural effusion. Unchanged moderate height loss of a lower thoracic vertebral body. IMPRESSION: Chronic hyperinflation and emphysematous changes. Bibasilar scarring. No definite acute lung process. Electronically Signed   By: Neita Garnetonald  Viola M.D.   On: 03/25/2021 17:58  ? ?CT HEAD WO CONTRAST (5MM) ? ?Result Date: 03/25/2021 ?CLINICAL DATA:  Patient fell and hit head on the floor. EXAM: CT HEAD WITHOUT CONTRAST CT CERVICAL SPINE WITHOUT CONTRAST TECHNIQUE: Multidetector CT imaging of the head and cervical spine was performed following the standard protocol without intravenous contrast. Multiplanar CT image reconstructions of the cervical spine were also generated. RADIATION DOSE REDUCTION: This exam was performed according to the departmental dose-optimization program which includes automated exposure control, adjustment of the mA and/or kV according to patient size and/or use of iterative reconstruction technique. COMPARISON:  Head CT 12/22/2020 FINDINGS: CT HEAD FINDINGS Brain: There is no evidence for acute hemorrhage, hydrocephalus, mass lesion, or abnormal extra-axial fluid collection. No definite CT evidence for acute infarction. Chronic post ischemic change noted left insular region, stable. Diffuse loss of parenchymal volume is consistent with atrophy. Vascular: No hyperdense vessel or unexpected calcification. Skull: No evidence for fracture. No worrisome  lytic or sclerotic lesion. Sinuses/Orbits: The visualized paranasal sinuses and mastoid air cells are clear. Visualized portions of the globes and intraorbital fat are unremarkable. Other: None. CT CERVICAL SPINE FINDINGS Alignment: Trace anterolisthesis noted C3 on 4, compatible with advanced left-sided facet degeneration at the same level. Trace retrolisthesis of C4 on 5 and C5 on 6 likely related to the substantial loss of disc height at both levels. Mild anterolisthesis of C7 on T1 also likely degenerative. Skull base and vertebrae: No acute fracture. No primary bone lesion or focal pathologic process. Soft tissues and spinal canal: No prevertebral fluid or swelling. No visible canal hematoma. Disc levels: Marked loss of disc height with endplate degeneration noted C4-5, C5-6, and C6-7. Prominent loss of disc height also noted T1-2. Upper chest: Biapical pleuroparenchymal scarring. Other: None. IMPRESSION: 1. No acute intracranial abnormality.  Diffuse atrophy again noted. 2. No cervical spine fracture or traumatic subluxation. 3. Advanced degenerative changes in the cervical spine as above. Electronically Signed  By: Kennith Center M.D.   On: 03/25/2021 16:42  ? ?CT Cervical Spine Wo Contrast ? ?Result Date: 03/25/2021 ?CLINICAL DATA:  Patient fell and hit head on the floor. EXAM: CT HEAD WITHOUT CONTRAST CT CERVICAL SPINE WITHOUT CONTRAST TECHNIQUE: Multidetector CT imaging of the head and cervical spine was performed following the standard protocol without intravenous contrast. Multiplanar CT image reconstructions of the cervical spine were also generated. RADIATION DOSE REDUCTION: This exam was performed according to the departmental dose-optimization program which includes automated exposure control, adjustment of the mA and/or kV according to patient size and/or use of iterative reconstruction technique. COMPARISON:  Head CT 12/22/2020 FINDINGS: CT HEAD FINDINGS Brain: There is no evidence for acute  hemorrhage, hydrocephalus, mass lesion, or abnormal extra-axial fluid collection. No definite CT evidence for acute infarction. Chronic post ischemic change noted left insular region, stable. Diffuse loss of parenchym

## 2021-03-25 NOTE — Discharge Instructions (Addendum)
-  Treat pain with Tylenol/ibuprofen as needed.  Use oxycodone sparingly. ?-Follow-up with your primary care provider as needed. ?-Return to the emergency department anytime if you begin to experience any new or worsening symptoms ?

## 2021-03-25 NOTE — ED Notes (Signed)
Repeat CBG 135 ?

## 2021-03-25 NOTE — ED Notes (Signed)
Pt transported to CT ?

## 2021-03-25 NOTE — ED Triage Notes (Signed)
First Nurse Note: Pt to ED via POV with c/o being diabetic and she fell and hit her head. I helped pt out of the car.  ?

## 2021-03-25 NOTE — ED Notes (Signed)
This rn attempted x3 and Nicholaus Bloom RN attempted x2 for IV access. Unable to obtain access at this time.  ?

## 2021-03-25 NOTE — ED Triage Notes (Signed)
See first nurse note- pt to ER after falling when walking from one room to another in her home. Scab present to top of forehead. Reports her cbg was 49 at home, husband administered juice and repeat cbg here is 69. Patient given x2 orange juices.  ? ?Denies other injury. ?

## 2021-03-25 NOTE — ED Notes (Signed)
RN still unable to obtain IV access. IV team at bedside  ?

## 2021-03-25 NOTE — ED Notes (Signed)
Patient with omniopod/ insulin pump in place. Patient suspending delivery at this time.  ?

## 2021-03-25 NOTE — ED Notes (Signed)
Checked Blood sugar it was 69. Pt is now reporting that before she fell her CBG was 43, she drank OJ and her husband brought her here. She denies being on any blood thinners.  ?

## 2021-04-14 ENCOUNTER — Other Ambulatory Visit: Payer: Self-pay | Admitting: Surgery

## 2021-04-14 ENCOUNTER — Other Ambulatory Visit (HOSPITAL_COMMUNITY): Payer: Self-pay | Admitting: Surgery

## 2021-04-14 DIAGNOSIS — Z96612 Presence of left artificial shoulder joint: Secondary | ICD-10-CM

## 2021-04-14 DIAGNOSIS — T8484XA Pain due to internal orthopedic prosthetic devices, implants and grafts, initial encounter: Secondary | ICD-10-CM

## 2021-04-30 ENCOUNTER — Ambulatory Visit
Admission: RE | Admit: 2021-04-30 | Discharge: 2021-04-30 | Disposition: A | Discharge status: Home / Self Care | Payer: Medicare Other | Source: Ambulatory Visit | Attending: Surgery | Admitting: Surgery

## 2021-04-30 DIAGNOSIS — T8484XA Pain due to internal orthopedic prosthetic devices, implants and grafts, initial encounter: Secondary | ICD-10-CM | POA: Diagnosis present

## 2021-04-30 DIAGNOSIS — Z96612 Presence of left artificial shoulder joint: Secondary | ICD-10-CM | POA: Diagnosis present

## 2021-04-30 IMAGING — CT CT SHOULDER*L* W/O CM
4 of 6 series · 13 of 35 positions shown, 16 images · non-contrast
Comparison: None.

CLINICAL DATA: Left shoulder pain, limited range of motion

EXAM:
CT OF THE UPPER LEFT EXTREMITY WITHOUT CONTRAST
TECHNIQUE: Multidetector CT imaging of the upper left extremity was performed
according to the standard protocol.
RADIATION DOSE REDUCTION: This exam was performed according to the
departmental dose-optimization program which includes automated
exposure control, adjustment of the mA and/or kV according to
patient size and/or use of iterative reconstruction technique.

[Series 4: ax bone · axial · 0.44mm/px · z∈[-233,-93]mm · 5 of 110 slices shown, 7 images]
[im 19/110  soft-tissue]
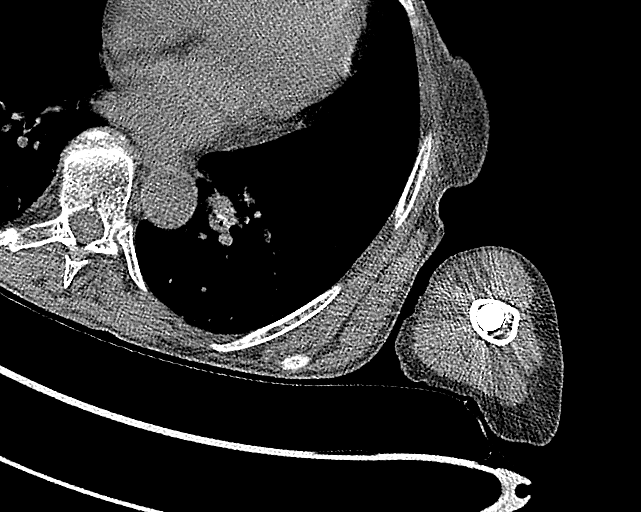
[im 19/110  bone]
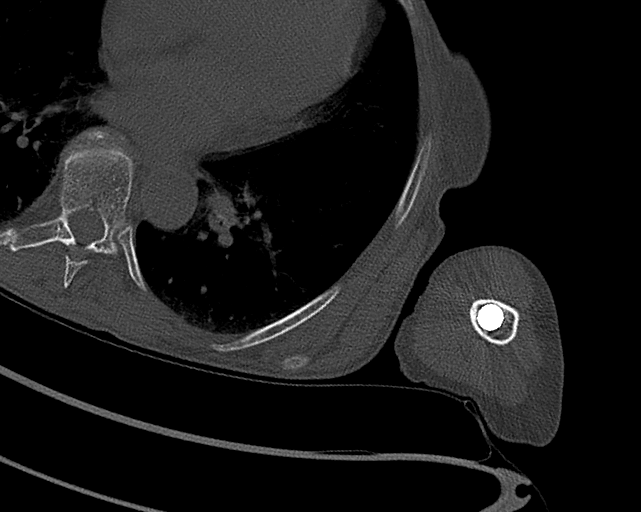
[im 37/110  bone]
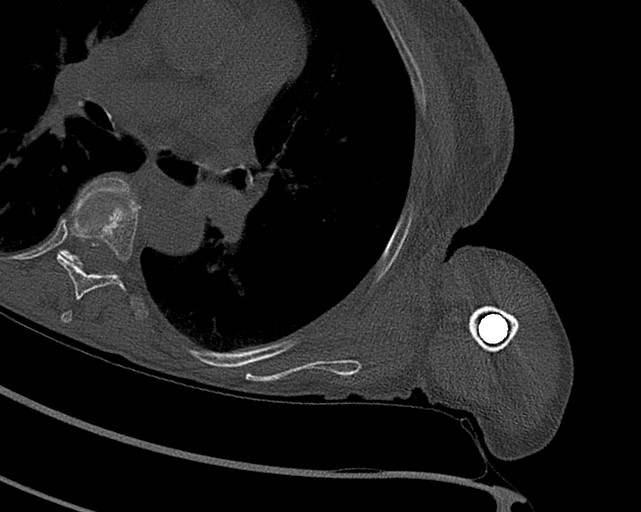
[im 55/110  bone]
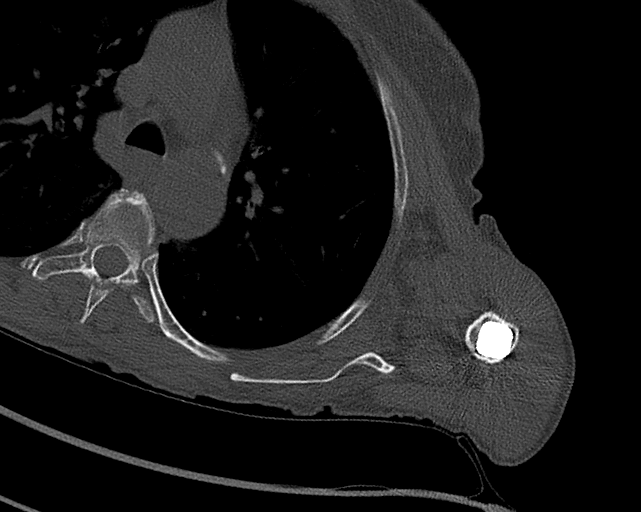
[im 73/110  bone]
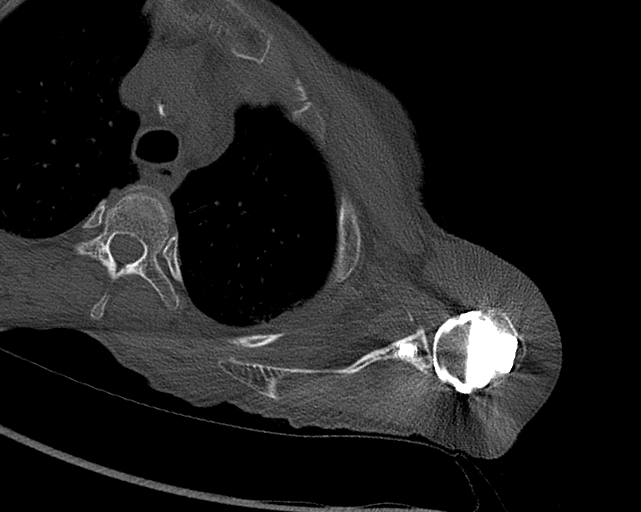
[im 91/110  soft-tissue]
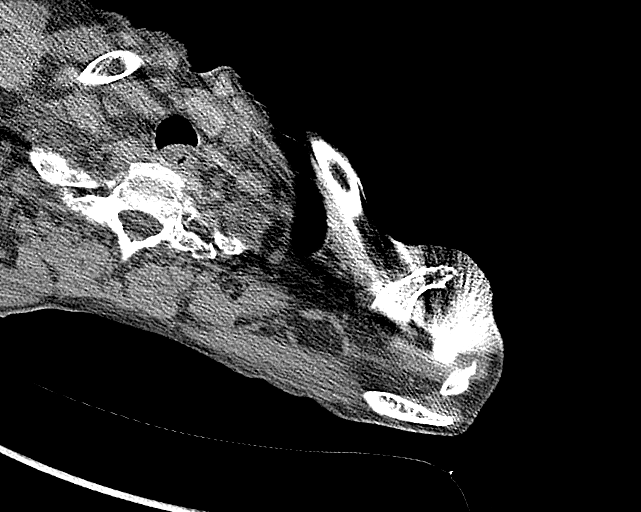
[im 91/110  bone]
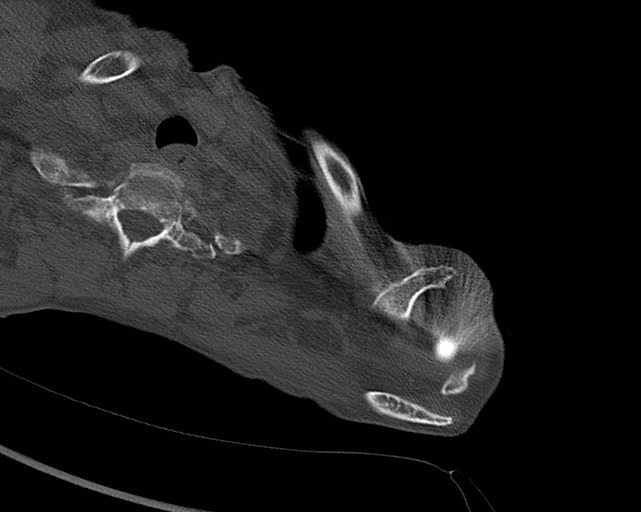

[Series 5: (person_name) · axial · 0.44mm/px · z∈[-233,-198]mm · 2 of 110 slices shown]
[im 19/110  bone]
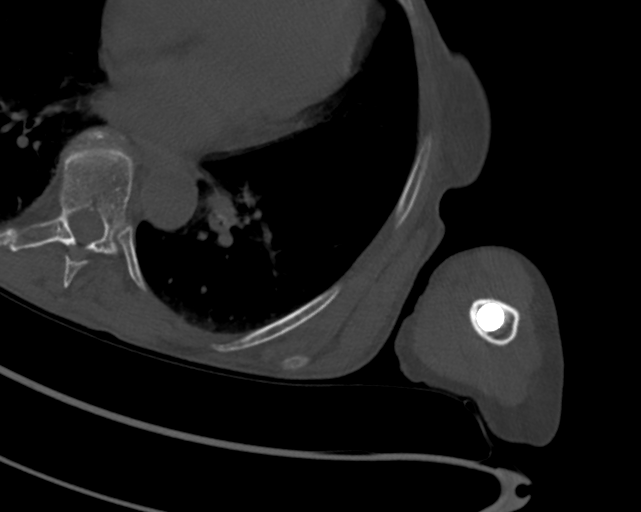
[im 37/110  bone]
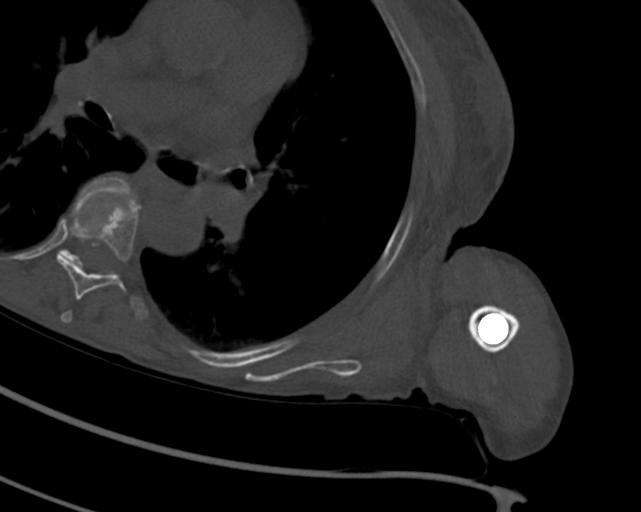

[Series 6: cor bone · coronal · 0.43mm/px · 1 of 114 slices shown]
[im 57/114  bone]
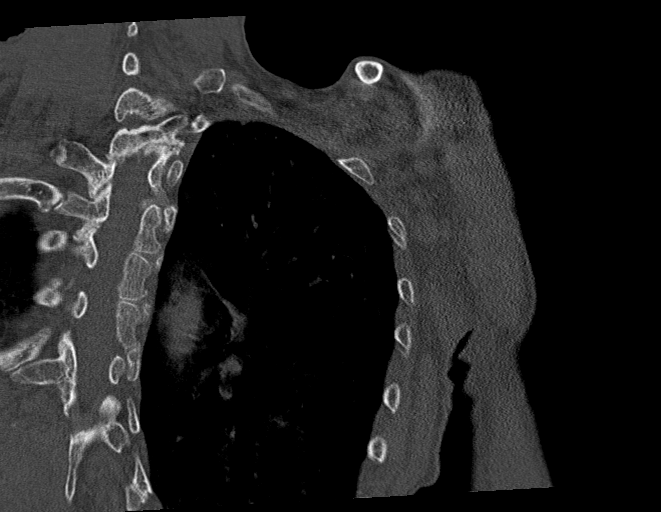

[Series 8: sag bone · sagittal · 0.43mm/px · 5 of 132 slices shown, 6 images]
[im 22/132  soft-tissue]
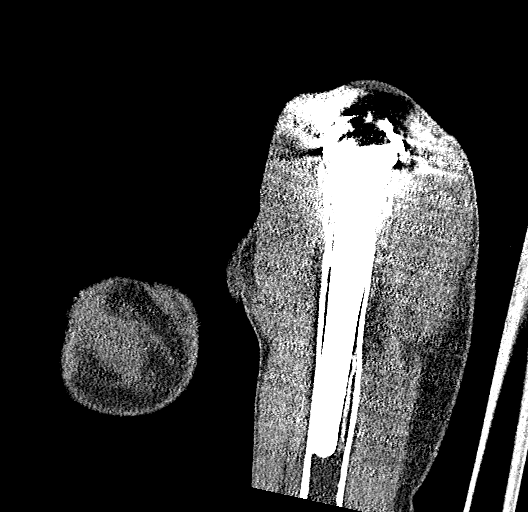
[im 22/132  bone]
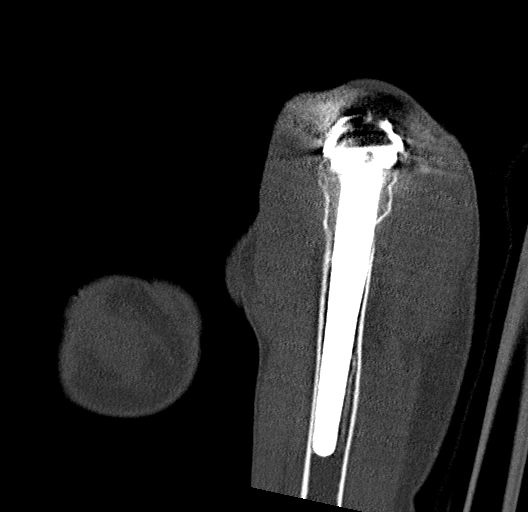
[im 44/132  bone]
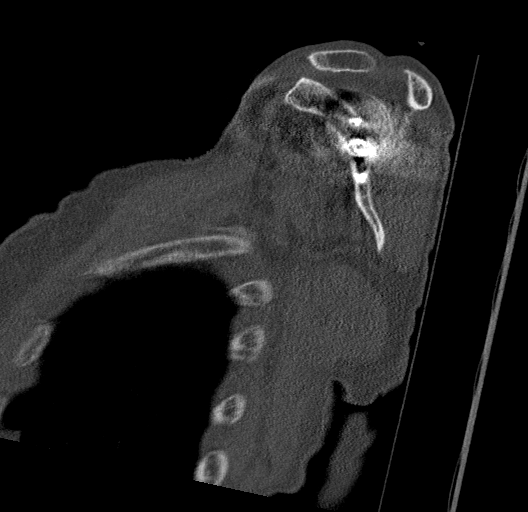
[im 66/132  bone]
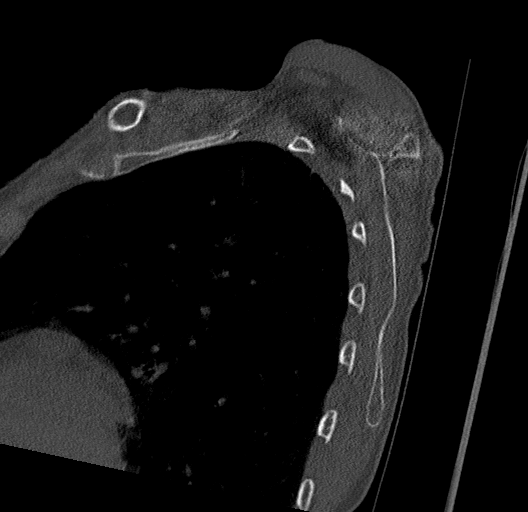
[im 88/132  bone]
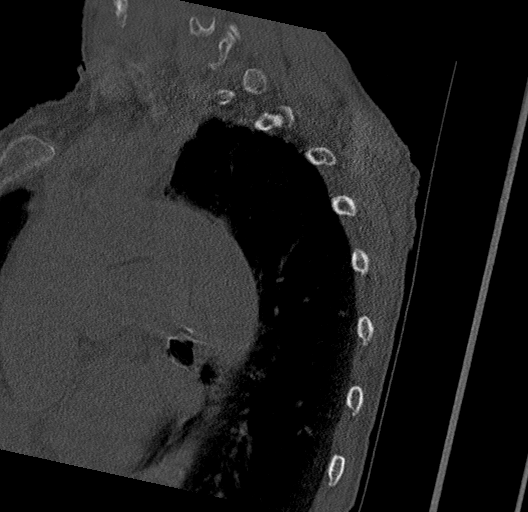
[im 110/132  bone]
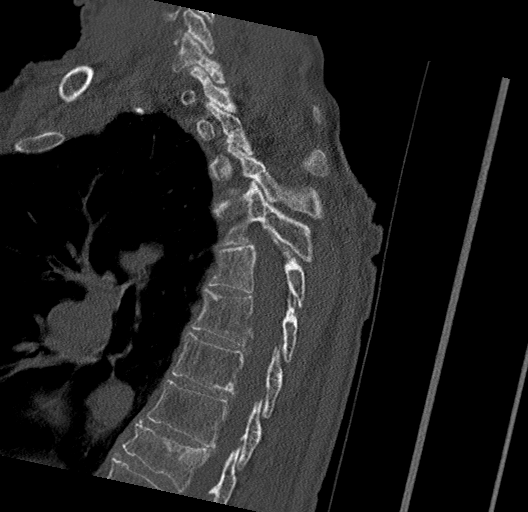

[13 of 35 positions shown; findings below may reference images not displayed]

FINDINGS: Bones/Joint/Cartilage

Left shoulder reverse arthroplasty the with susceptibility artifact
partially obscuring the adjacent soft tissue and osseous structures.
No hardware failure or complication. No osteolysis or periarticular
fluid collection.

No acute fracture or dislocation. Normal alignment. No joint
effusion. Degenerative disease with disc height loss at C5-6, C6-7,
C7-T1, T1-2. Mild chronic T5, T6 and T9 vertebral body compression
fractures. Generalized osteopenia. Minimal anterolisthesis of C7 on
T1 secondary to facet disease. Right foraminal stenosis at C5-6,
C6-7, C7-T1 and T1-2.

Chronic ununited acromial fracture.

Ligaments

Ligaments are suboptimally evaluated by CT.

Muscles and Tendons
Muscles are normal. No muscle atrophy. No intramuscular fluid
collection or hematoma.

Soft tissue
No fluid collection or hematoma. No soft tissue mass. Visualized
lungs are clear. Thoracic aortic atherosclerosis.
IMPRESSION: 1. Left shoulder reverse arthroplasty without hardware failure or
complication.
2. Mild chronic T5, T6 and T9 vertebral body compression fractures.
3. Right foraminal stenosis at C5-6, C6-7, C7-T1 and T1-2.
4. Aortic Atherosclerosis ([KO]-[KO]).

## 2021-06-25 ENCOUNTER — Emergency Department: Payer: Medicare Other

## 2021-06-25 ENCOUNTER — Emergency Department
Admission: EM | Admit: 2021-06-25 | Discharge: 2021-06-25 | Disposition: A | Discharge status: Home / Self Care | Payer: Medicare Other | Attending: Emergency Medicine | Admitting: Emergency Medicine

## 2021-06-25 ENCOUNTER — Other Ambulatory Visit: Payer: Self-pay

## 2021-06-25 DIAGNOSIS — M25512 Pain in left shoulder: Secondary | ICD-10-CM | POA: Diagnosis not present

## 2021-06-25 DIAGNOSIS — S0990XA Unspecified injury of head, initial encounter: Secondary | ICD-10-CM | POA: Diagnosis not present

## 2021-06-25 DIAGNOSIS — S299XXA Unspecified injury of thorax, initial encounter: Secondary | ICD-10-CM | POA: Insufficient documentation

## 2021-06-25 DIAGNOSIS — E119 Type 2 diabetes mellitus without complications: Secondary | ICD-10-CM | POA: Insufficient documentation

## 2021-06-25 DIAGNOSIS — J45909 Unspecified asthma, uncomplicated: Secondary | ICD-10-CM | POA: Insufficient documentation

## 2021-06-25 DIAGNOSIS — W19XXXA Unspecified fall, initial encounter: Secondary | ICD-10-CM | POA: Insufficient documentation

## 2021-06-25 DIAGNOSIS — F17200 Nicotine dependence, unspecified, uncomplicated: Secondary | ICD-10-CM | POA: Diagnosis not present

## 2021-06-25 DIAGNOSIS — Z720 Tobacco use: Secondary | ICD-10-CM

## 2021-06-25 DIAGNOSIS — M545 Low back pain, unspecified: Secondary | ICD-10-CM | POA: Insufficient documentation

## 2021-06-25 DIAGNOSIS — M25532 Pain in left wrist: Secondary | ICD-10-CM | POA: Diagnosis not present

## 2021-06-25 DIAGNOSIS — S298XXA Other specified injuries of thorax, initial encounter: Secondary | ICD-10-CM

## 2021-06-25 LAB — BASIC METABOLIC PANEL
Anion gap: 8 (ref 5–15)
BUN: 21 mg/dL (ref 8–23)
CO2: 23 mmol/L (ref 22–32)
Calcium: 9 mg/dL (ref 8.9–10.3)
Chloride: 102 mmol/L (ref 98–111)
Creatinine, Ser: 0.77 mg/dL (ref 0.44–1.00)
GFR, Estimated: >60 mL/min (ref >60)
Glucose, Bld: 137 mg/dL — ABNORMAL HIGH (ref 70–99)
Potassium: 4.5 mmol/L (ref 3.5–5.1)
Sodium: 133 mmol/L — ABNORMAL LOW (ref 135–145)

## 2021-06-25 LAB — CBC
HCT: 38.6 % (ref 36.0–46.0)
Hemoglobin: 12.5 g/dL (ref 12.0–15.0)
MCH: 30.4 pg (ref 26.0–34.0)
MCHC: 32.4 g/dL (ref 30.0–36.0)
MCV: 93.9 fL (ref 80.0–100.0)
Platelets: 234 10*3/uL (ref 150–400)
RBC: 4.11 MIL/uL (ref 3.87–5.11)
RDW: 14.2 % (ref 11.5–15.5)
WBC: 11 10*3/uL — ABNORMAL HIGH (ref 4.0–10.5)
nRBC: 0 % (ref 0.0–0.2)

## 2021-06-25 LAB — TROPONIN I (HIGH SENSITIVITY): Troponin I (High Sensitivity): 4 ng/L (ref <18)

## 2021-06-25 IMAGING — DX DG SHOULDER 2+V*L*
3 series · 3 of 3 positions shown · non-contrast
Comparison: CT left shoulder [DATE]

CLINICAL DATA: Fall.  Pain.

EXAM:
LEFT SHOULDER - 2+ VIEW

[shoulder axial]
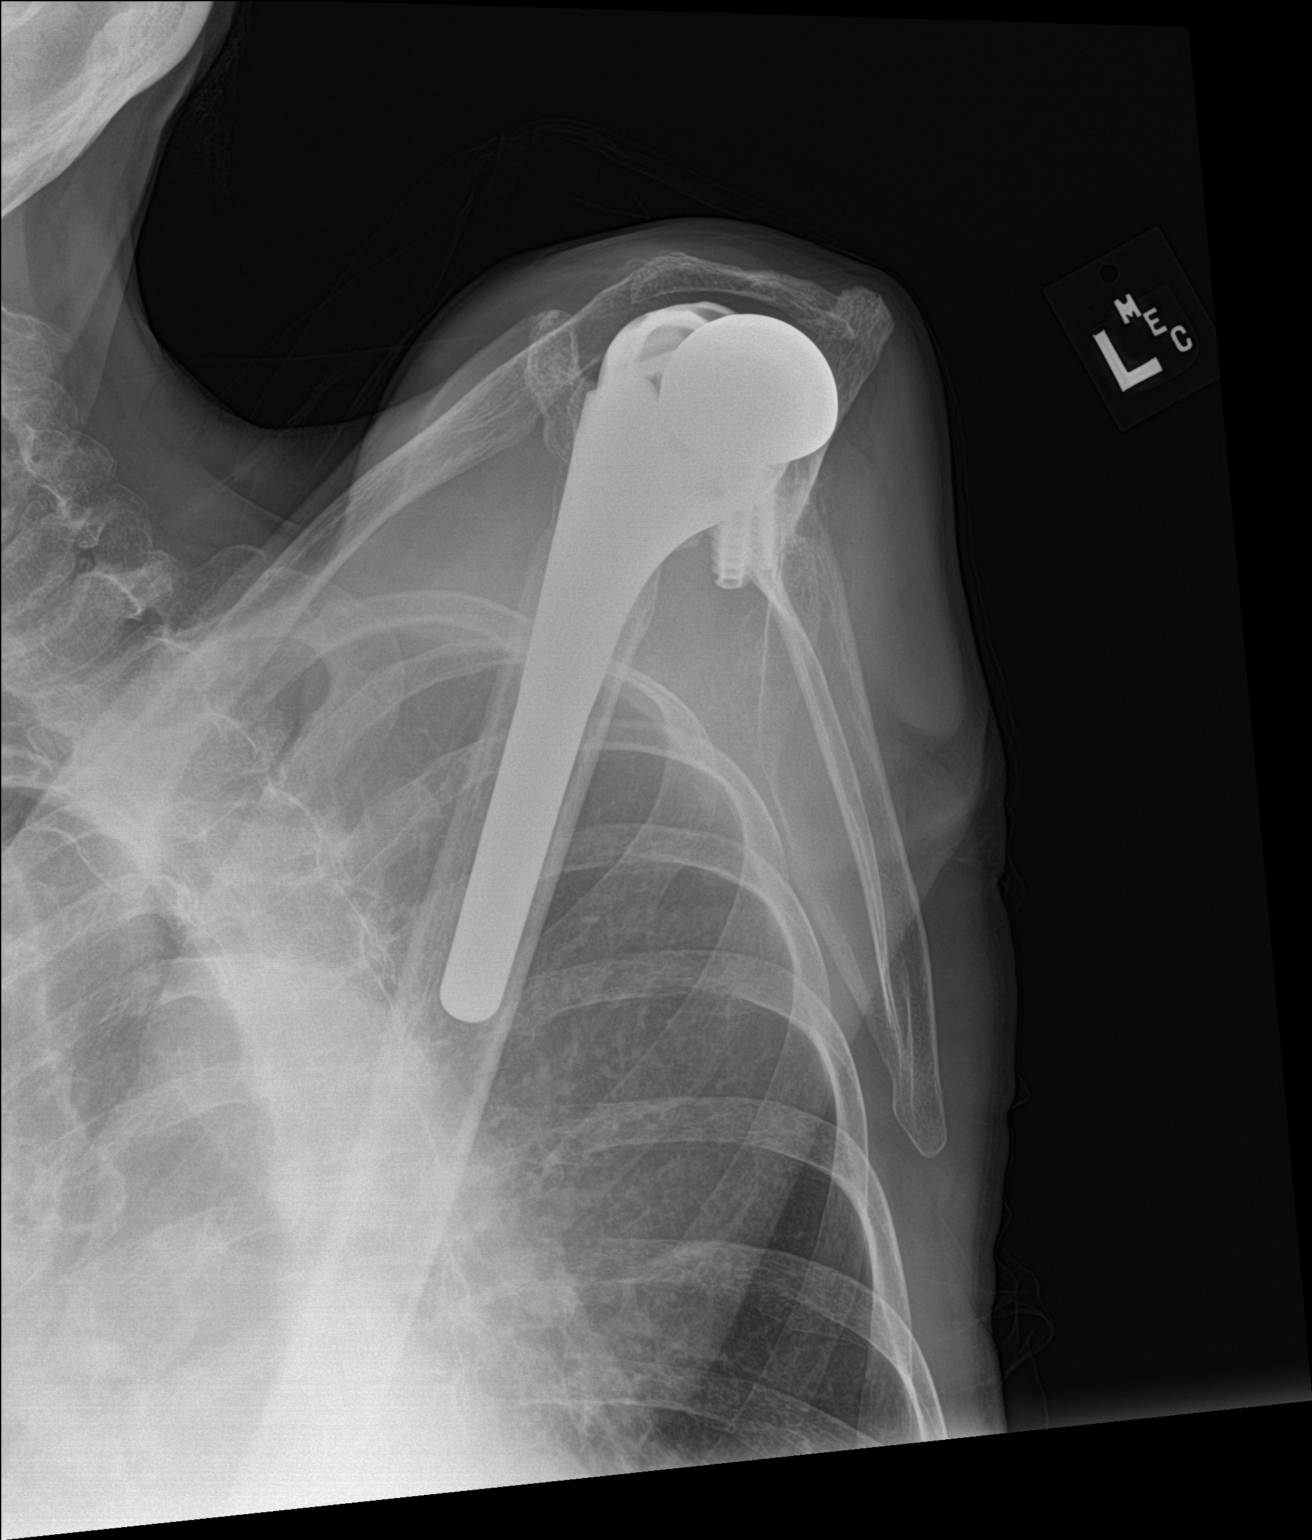

[shoulder ap]
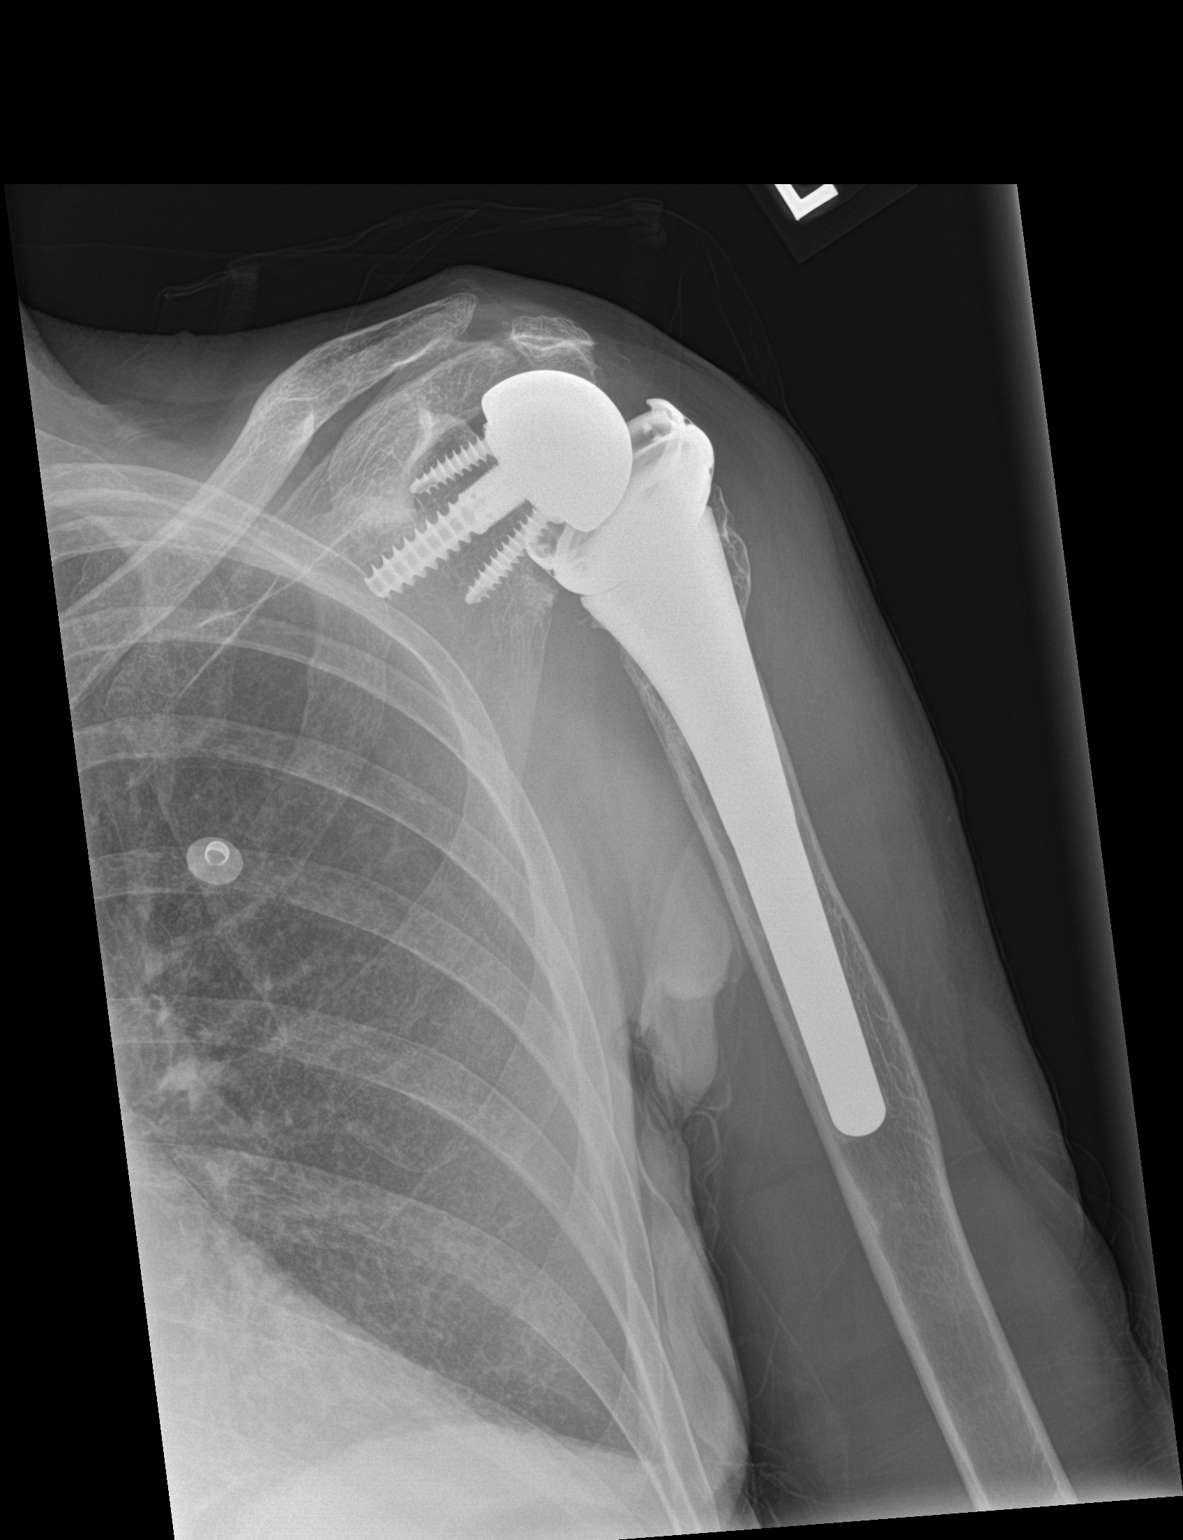

[shoulder obl]
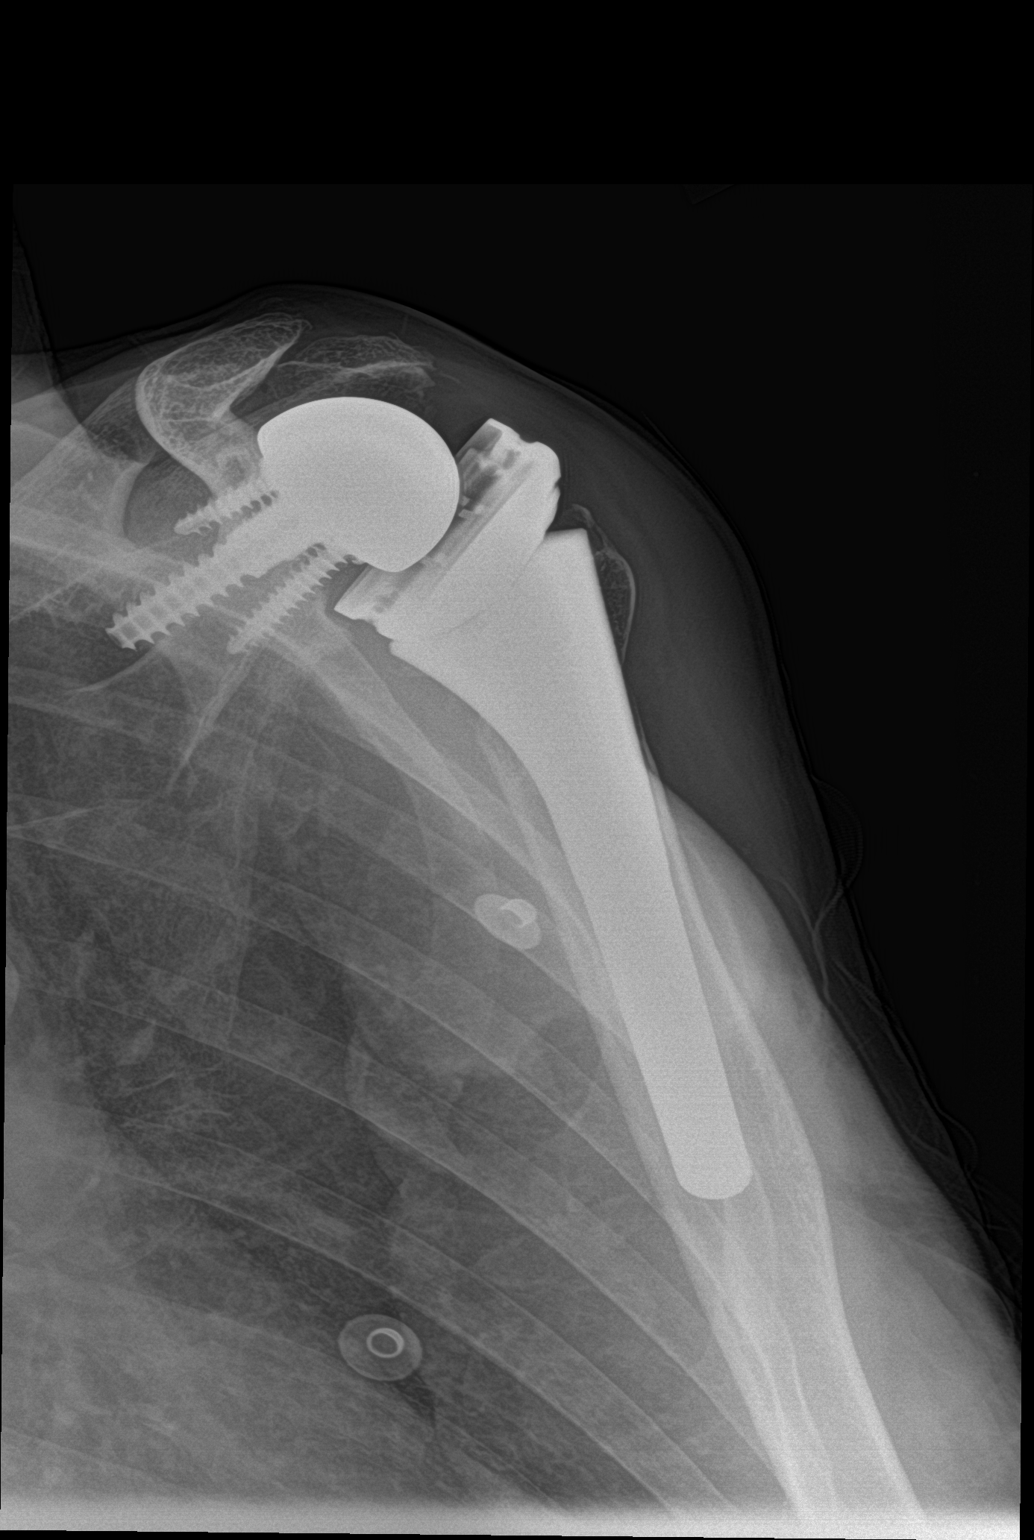

[3 of 3 positions shown; findings below may reference images not displayed]

FINDINGS: Status post reverse total left shoulder arthroplasty. Unchanged
normal alignment of the hardware components. No perihardware lucency
is seen to indicate hardware failure or loosening. The visualized
portion of the left upper lung is unremarkable.
IMPRESSION: Status post reverse total left shoulder arthroplasty without
evidence of hardware failure.

## 2021-06-25 IMAGING — CT CT HEAD W/O CM
4 series · 16 of 47 positions shown, 18 images · non-contrast
Comparison: CT head and cervical spine [DATE].

CLINICAL DATA: Head trauma, minor (Age >= 65y); Neck trauma (Age >=
65y)



[Series 2: head bone · axial · 0.43mm/px · z∈[-94,-62]mm · 3 of 78 slices shown]
[im 8/78  bone]
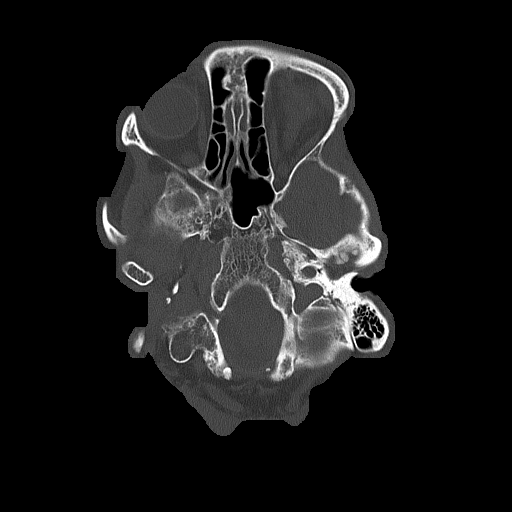
[im 16/78  bone]
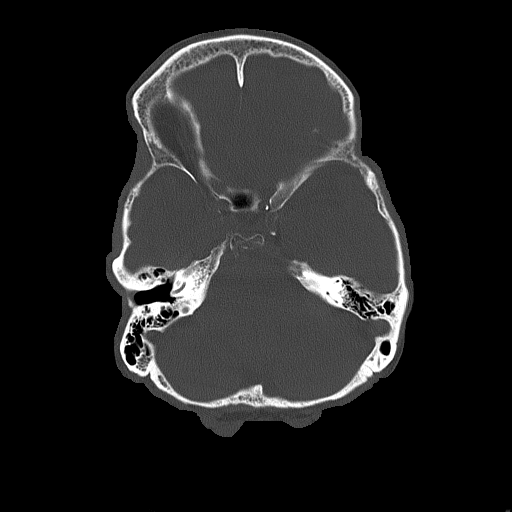
[im 24/78  bone]
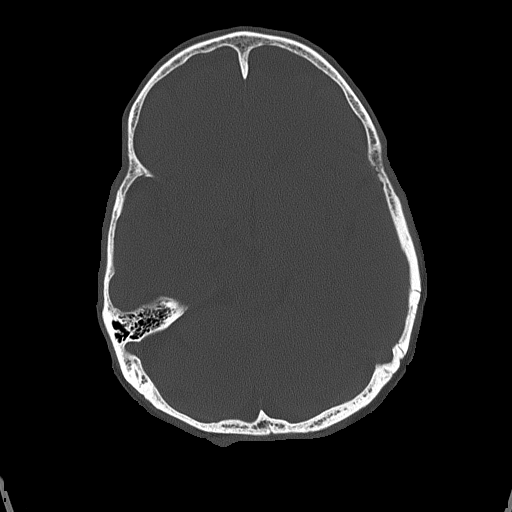

[Series 3: head wo · axial · 0.43mm/px · z∈[-93,+27]mm · 7 of 32 slices shown, 9 images]
[im 4/32  brain]
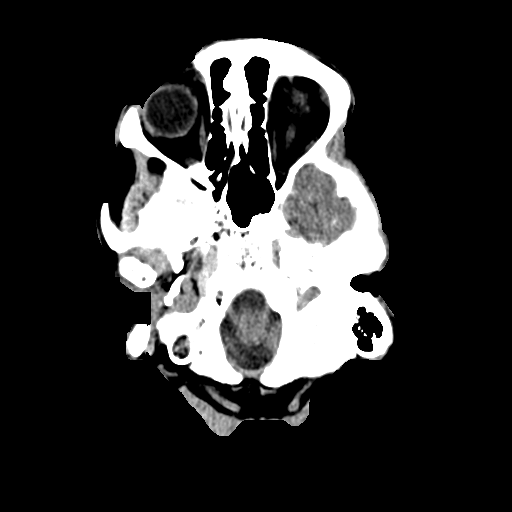
[im 4/32  bone]
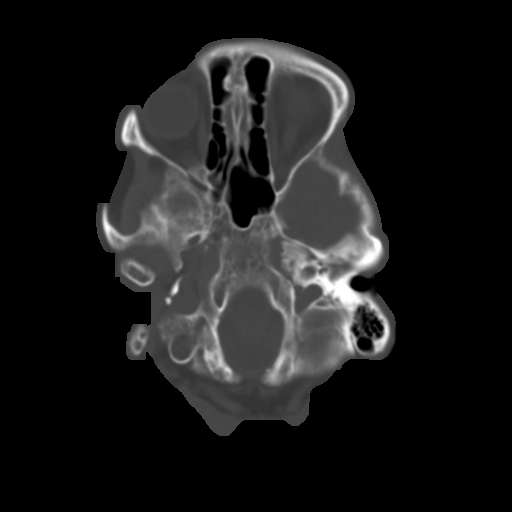
[im 8/32  brain]
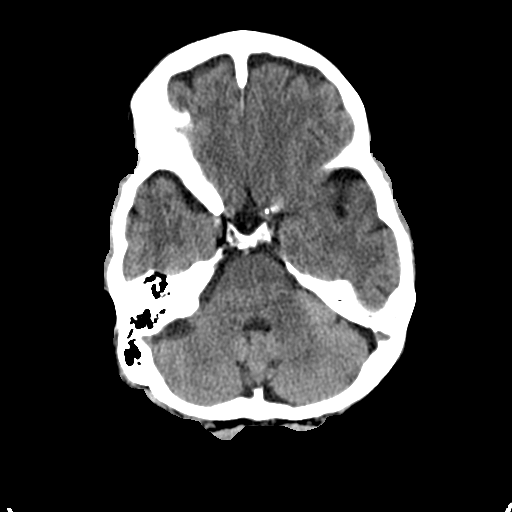
[im 12/32  brain]
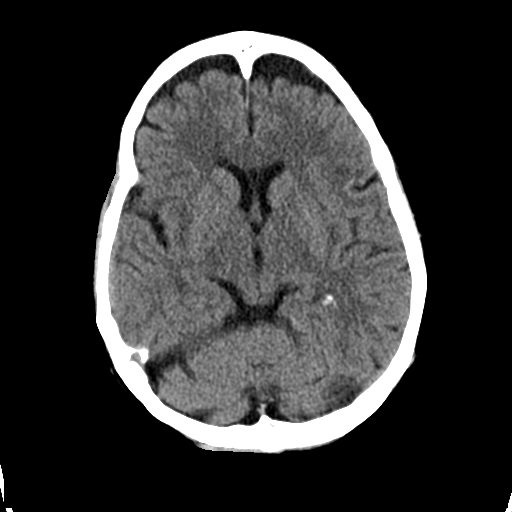
[im 16/32  brain]
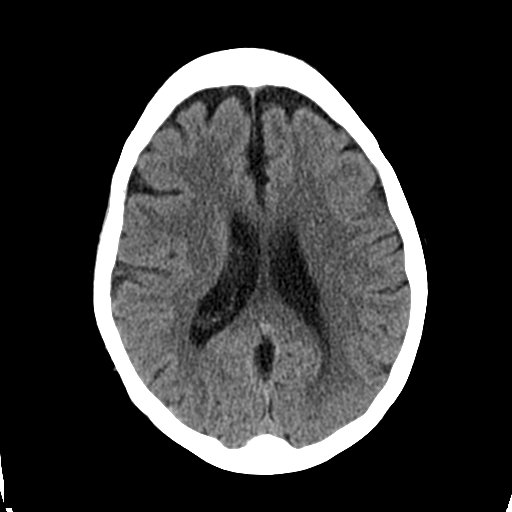
[im 20/32  brain]
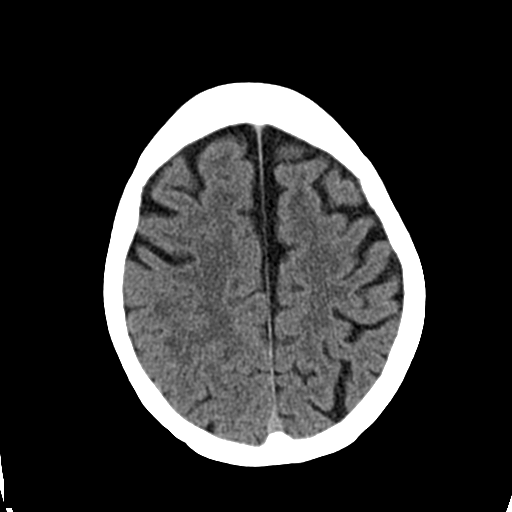
[im 20/32  bone]
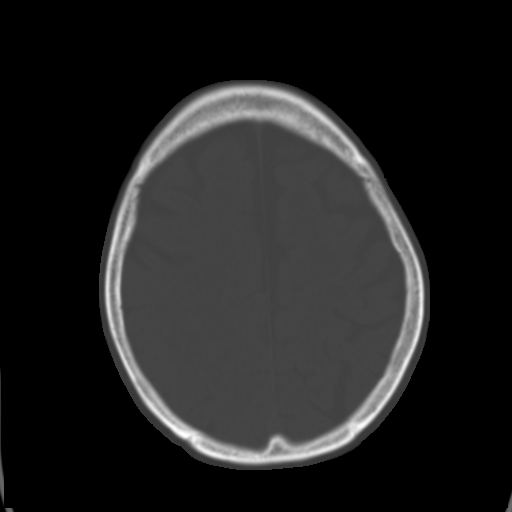
[im 24/32  brain]
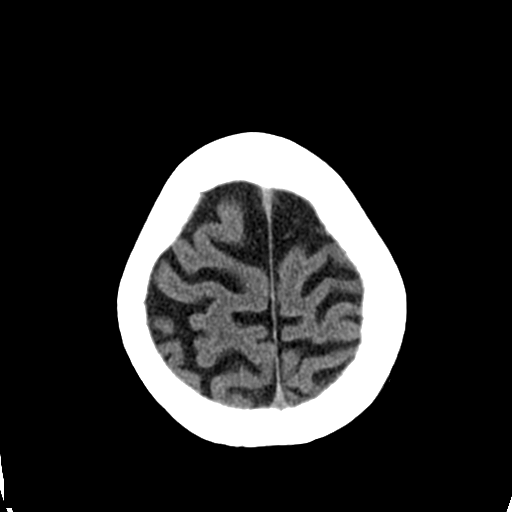
[im 28/32  brain]
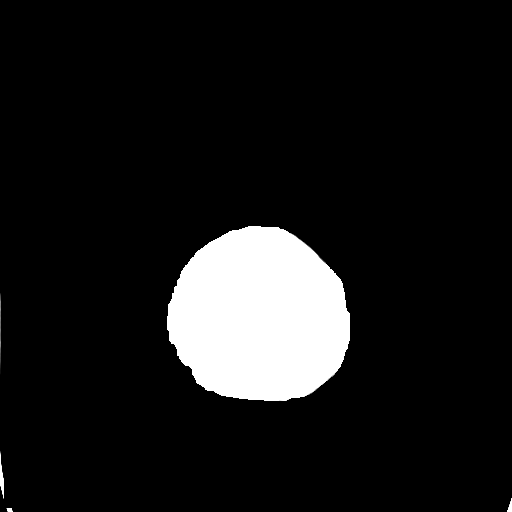

[Series 4: coronal soft tissue · coronal · 0.29mm/px · 3 of 66 slices shown]
[im 22/66  brain]
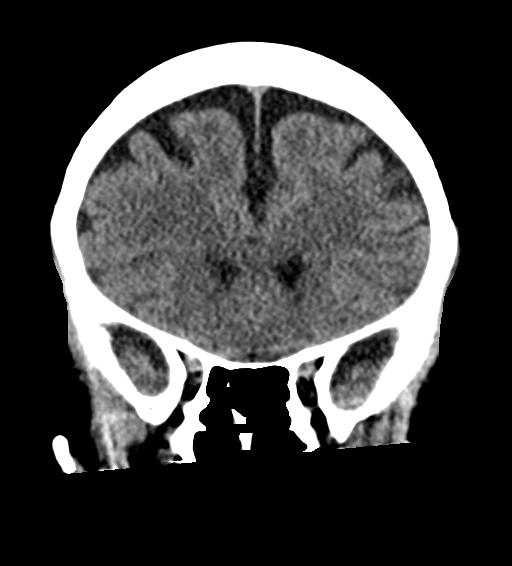
[im 29/66  brain]
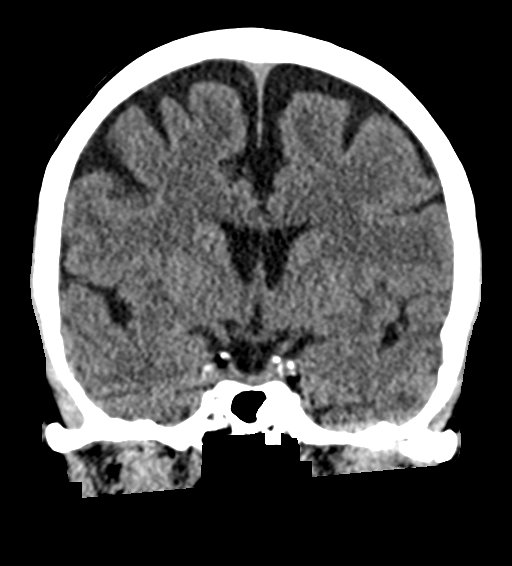
[im 37/66  brain]
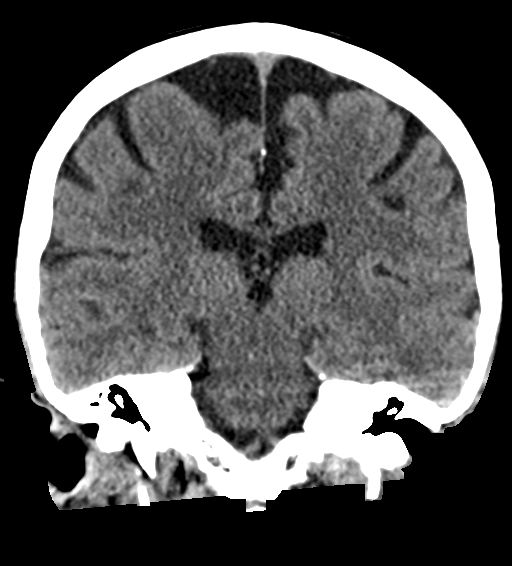

[Series 5: sagittal soft tissue · sagittal · 0.33mm/px · 3 of 51 slices shown]
[im 17/51  brain]
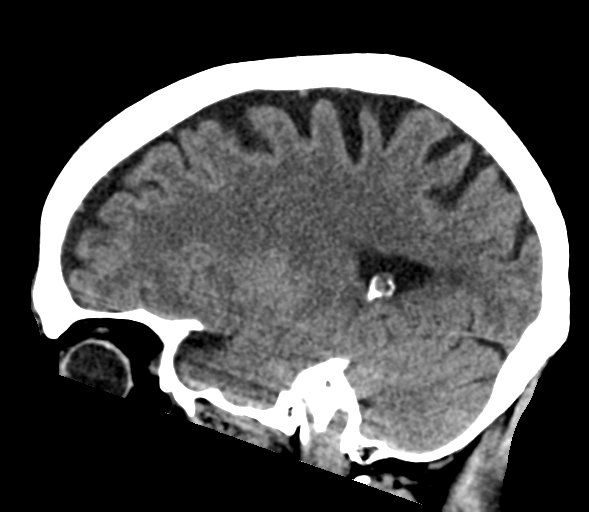
[im 26/51  brain]
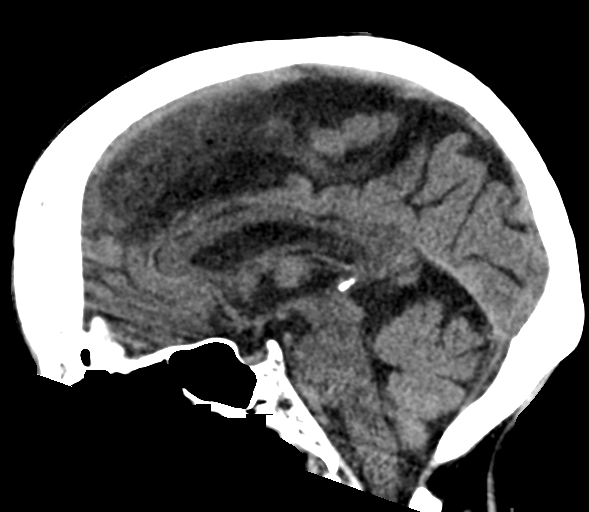
[im 34/51  brain]
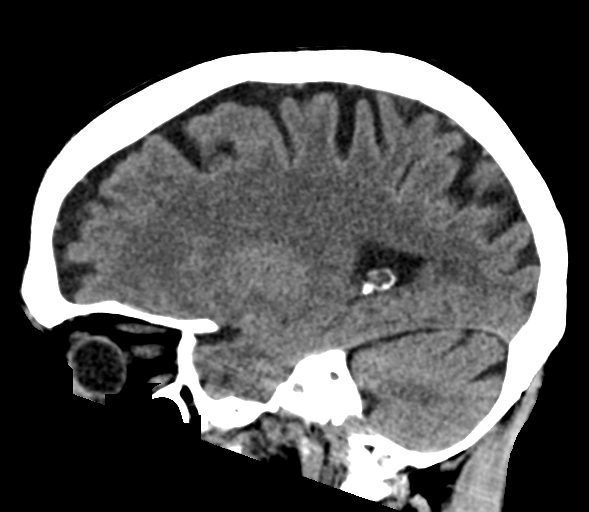

[16 of 47 positions shown; findings below may reference images not displayed]

FINDINGS: CT HEAD FINDINGS

Brain: No evidence of acute infarction, hemorrhage, hydrocephalus,
extra-axial collection or mass lesion/mass effect. Cerebral atrophy.

Vascular: Calcific intracranial atherosclerosis.

Skull: No acute fracture.

Sinuses/Orbits: Clear visualized sinuses. No acute orbital findings.

Other: No mastoid effusions.

CT CERVICAL SPINE FINDINGS

Alignment: Similar alignment. Similar anterolisthesis of C3 on C4.
Similar anterolisthesis of C7 on T1.

Skull base and vertebrae: Vertebral body heights are maintained. No
evidence of acute fracture.

Soft tissues and spinal canal: No prevertebral fluid or swelling. No
visible canal hematoma.

Disc levels: Similar multilevel degenerative change, including
degenerative disc disease, facet uncovertebral hypertrophy and brain
degrees of neural foraminal stenosis.

Upper chest: Emphysema in the visualized lung apices. No
consolidation.
IMPRESSION: 1. No evidence of acute intracranial abnormality. Similar cerebral
atrophy ([J7]-[J7]).
2. No evidence of acute fracture or traumatic malalignment the
cervical spine. Similar severe multilevel degenerative change.
3.  Emphysema ([J7]-[J7]).

## 2021-06-25 IMAGING — DX DG HAND COMPLETE 3+V*L*
3 series · 3 of 3 positions shown · non-contrast
Comparison: None Available.

CLINICAL DATA: Fall.  Dropped microwave on chest today.

EXAM:
LEFT HAND - COMPLETE 3+ VIEW

[hand ap]
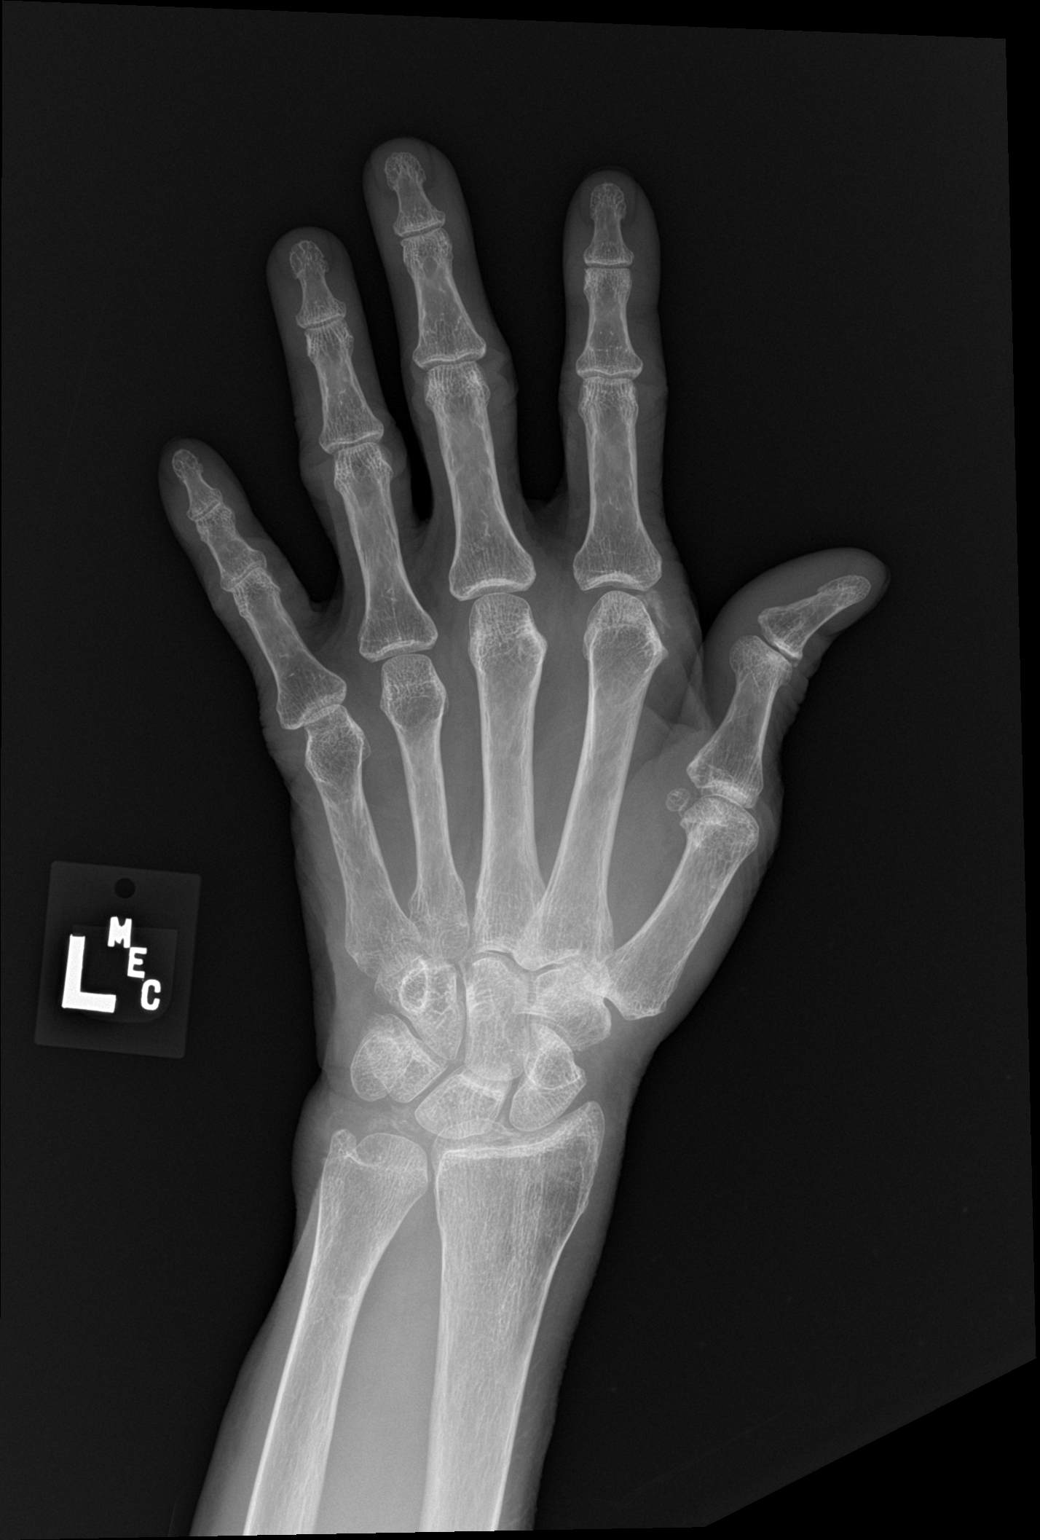

[hand obl]
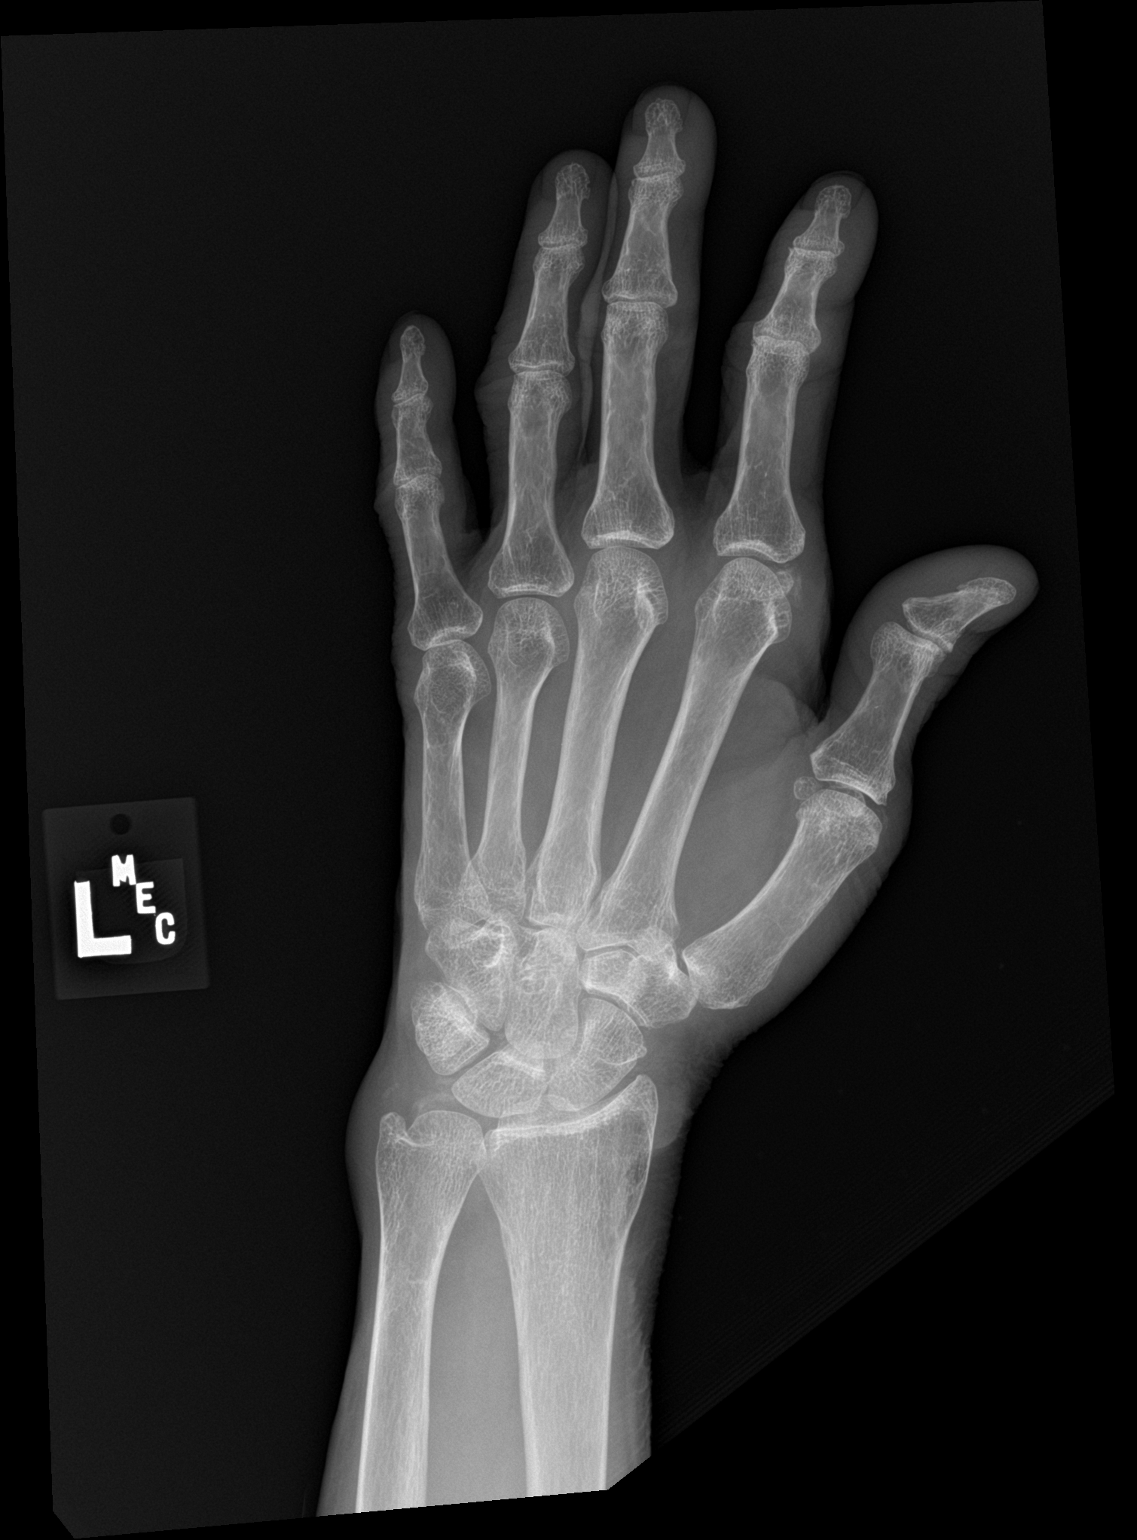

[hand lat]
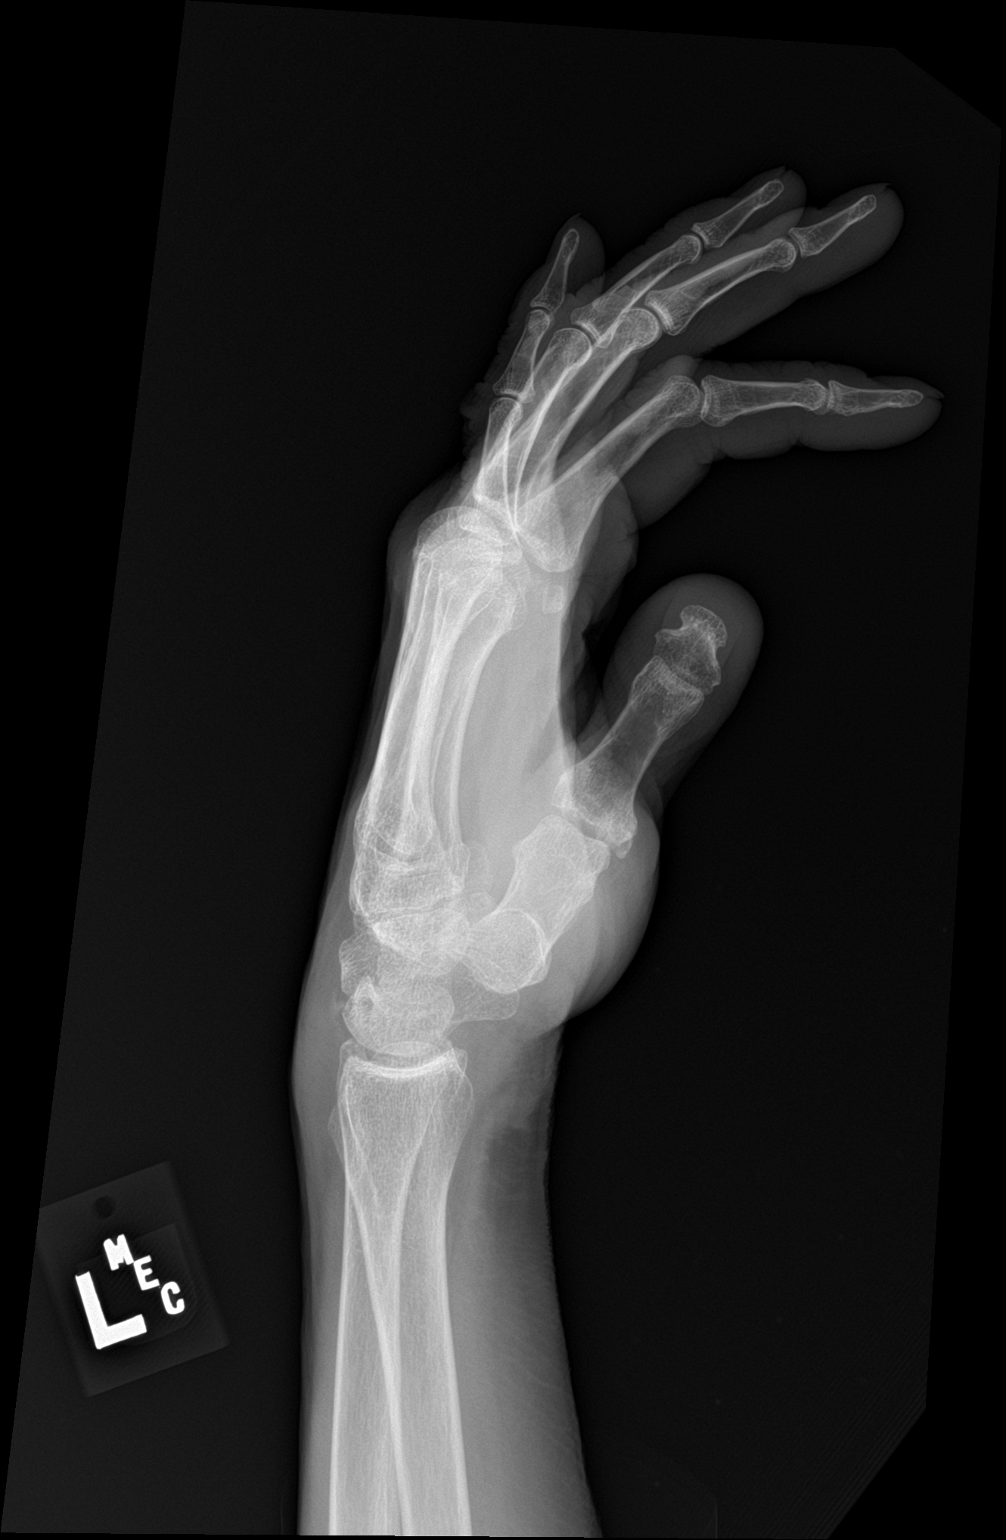

[3 of 3 positions shown; findings below may reference images not displayed]

FINDINGS: There is diffuse decreased bone mineralization. Mild-to-moderate
joint space narrowing of the interphalangeal joints diffusely. Mild
thumb carpometacarpal joint space narrowing and subchondral
sclerosis. Minimal ulnar positive variance. Chondrocalcinosis
overlies the triangle fibrocartilage complex. No acute fracture or
dislocation.
IMPRESSION: 1. No acute fracture is seen.
2. Mild osteoarthritis as above.

## 2021-06-25 IMAGING — CT CT CERVICAL SPINE W/O CM
3 of 4 series · 12 of 33 positions shown, 14 images · non-contrast
Comparison: CT head and cervical spine [DATE].

CLINICAL DATA: Head trauma, minor (Age >= 65y); Neck trauma (Age >=
65y)



[Series 4: sagittal bone · sagittal · 0.33mm/px · 5 of 44 slices shown, 6 images]
[im 15/44  bone]
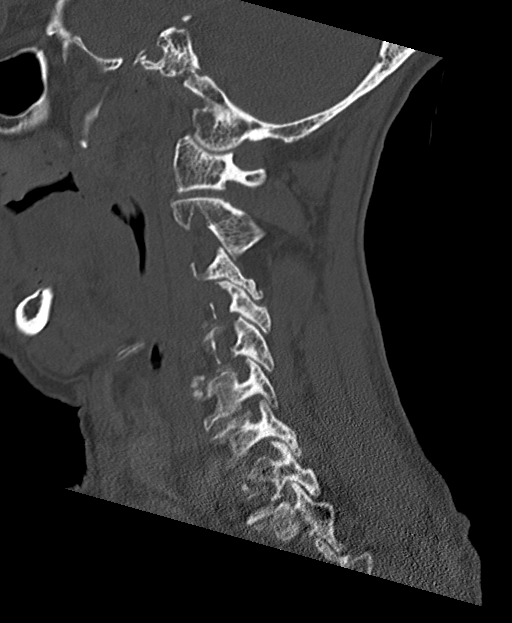
[im 18/44  bone]
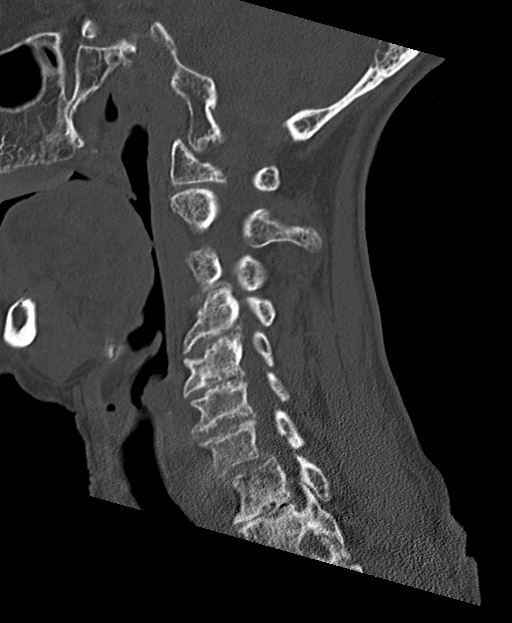
[im 22/44  soft-tissue]
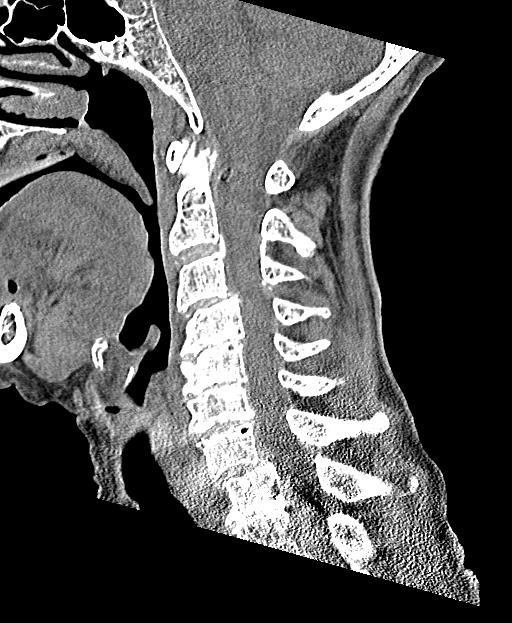
[im 22/44  bone]
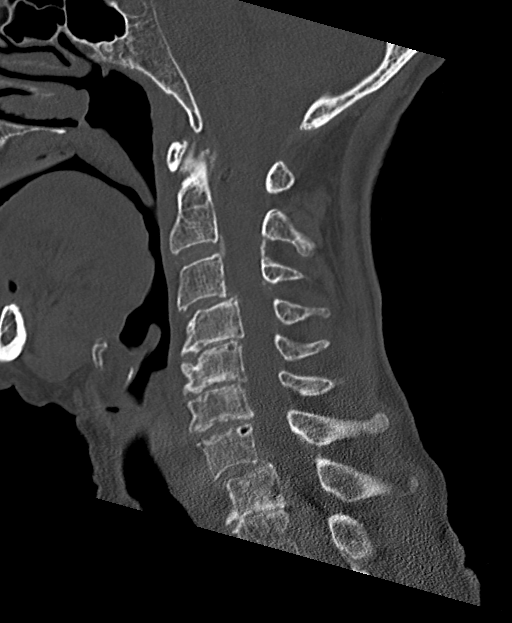
[im 26/44  bone]
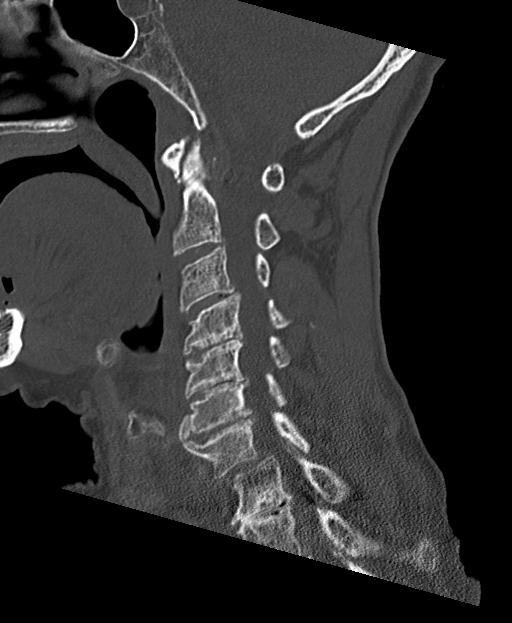
[im 29/44  bone]
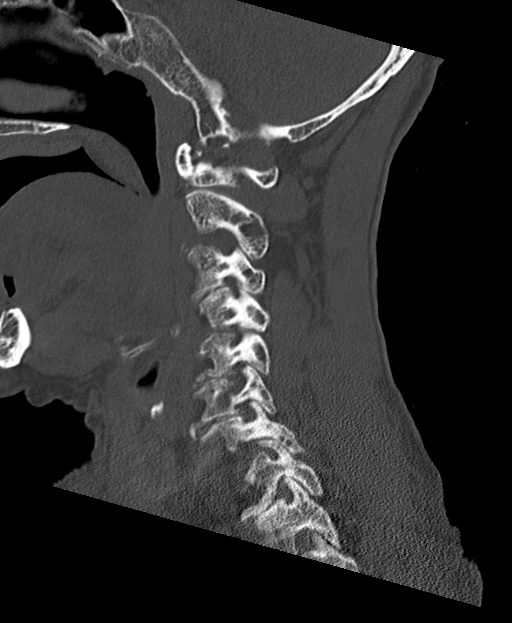

[Series 5: coronal bone · coronal · 0.42mm/px · 3 of 81 slices shown]
[im 18/81  bone]
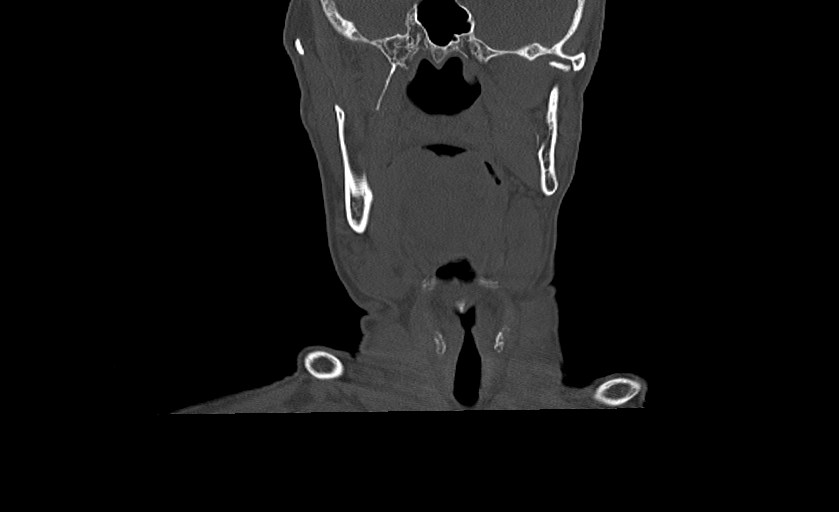
[im 33/81  bone]
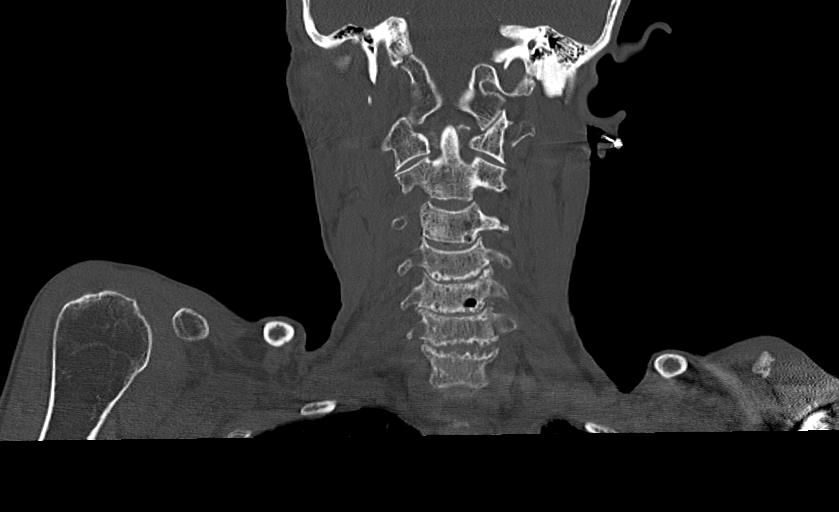
[im 48/81  bone]
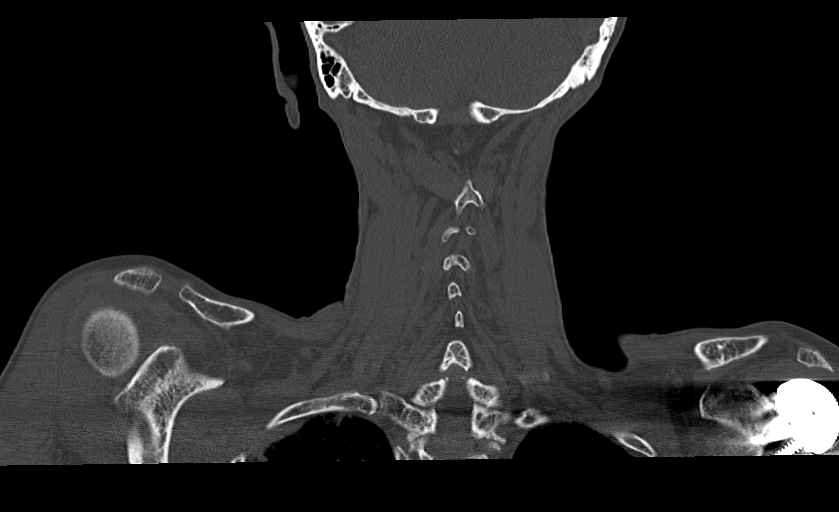

[Series 6: orthogonal bone · axial · 0.30mm/px · z∈[-262,-126]mm · 4 of 100 slices shown, 5 images]
[im 15/100  soft-tissue]
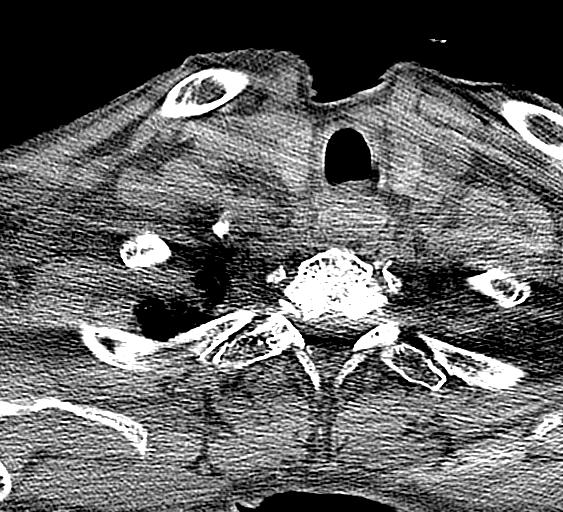
[im 15/100  bone]
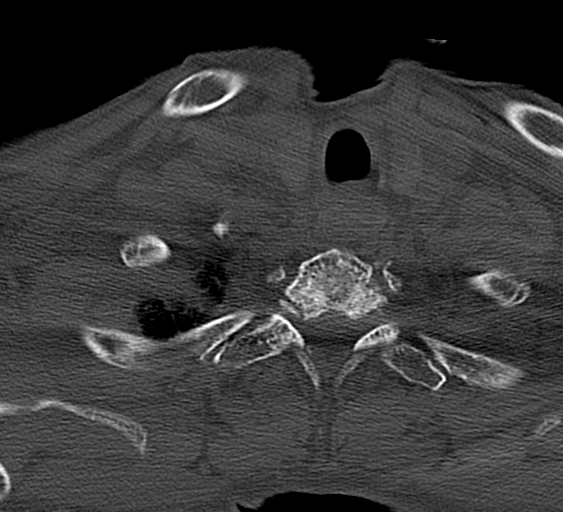
[im 43/100  bone]
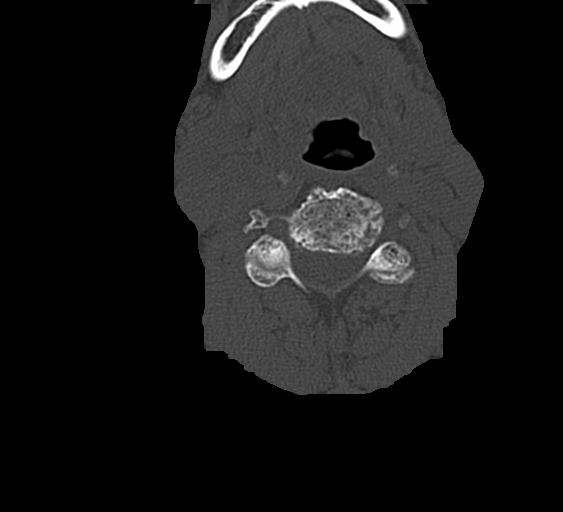
[im 57/100  bone]
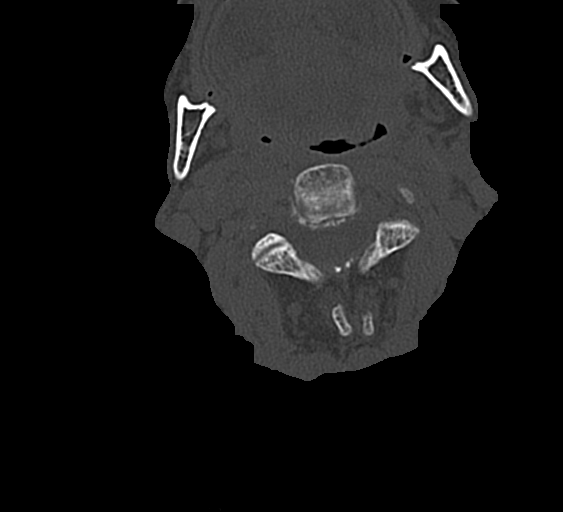
[im 85/100  bone]
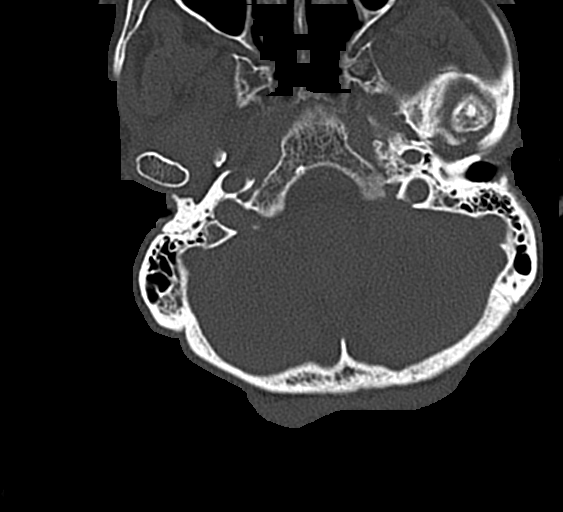

[12 of 33 positions shown; findings below may reference images not displayed]

FINDINGS: CT HEAD FINDINGS

Brain: No evidence of acute infarction, hemorrhage, hydrocephalus,
extra-axial collection or mass lesion/mass effect. Cerebral atrophy.

Vascular: Calcific intracranial atherosclerosis.

Skull: No acute fracture.

Sinuses/Orbits: Clear visualized sinuses. No acute orbital findings.

Other: No mastoid effusions.

CT CERVICAL SPINE FINDINGS

Alignment: Similar alignment. Similar anterolisthesis of C3 on C4.
Similar anterolisthesis of C7 on T1.

Skull base and vertebrae: Vertebral body heights are maintained. No
evidence of acute fracture.

Soft tissues and spinal canal: No prevertebral fluid or swelling. No
visible canal hematoma.

Disc levels: Similar multilevel degenerative change, including
degenerative disc disease, facet uncovertebral hypertrophy and brain
degrees of neural foraminal stenosis.

Upper chest: Emphysema in the visualized lung apices. No
consolidation.
IMPRESSION: 1. No evidence of acute intracranial abnormality. Similar cerebral
atrophy ([J7]-[J7]).
2. No evidence of acute fracture or traumatic malalignment the
cervical spine. Similar severe multilevel degenerative change.
3.  Emphysema ([J7]-[J7]).

## 2021-06-25 IMAGING — CR DG CHEST 2V
1 series · 2 of 2 positions shown · non-contrast
Comparison: Chest radiograph [DATE]

CLINICAL DATA: Chest pain after dropping a microwave on her chest

EXAM:
CHEST - 2 VIEW

[Series 1: dg chest 2 view · 0.14mm/px · 2 of 2 slices shown]
[im 1/2]
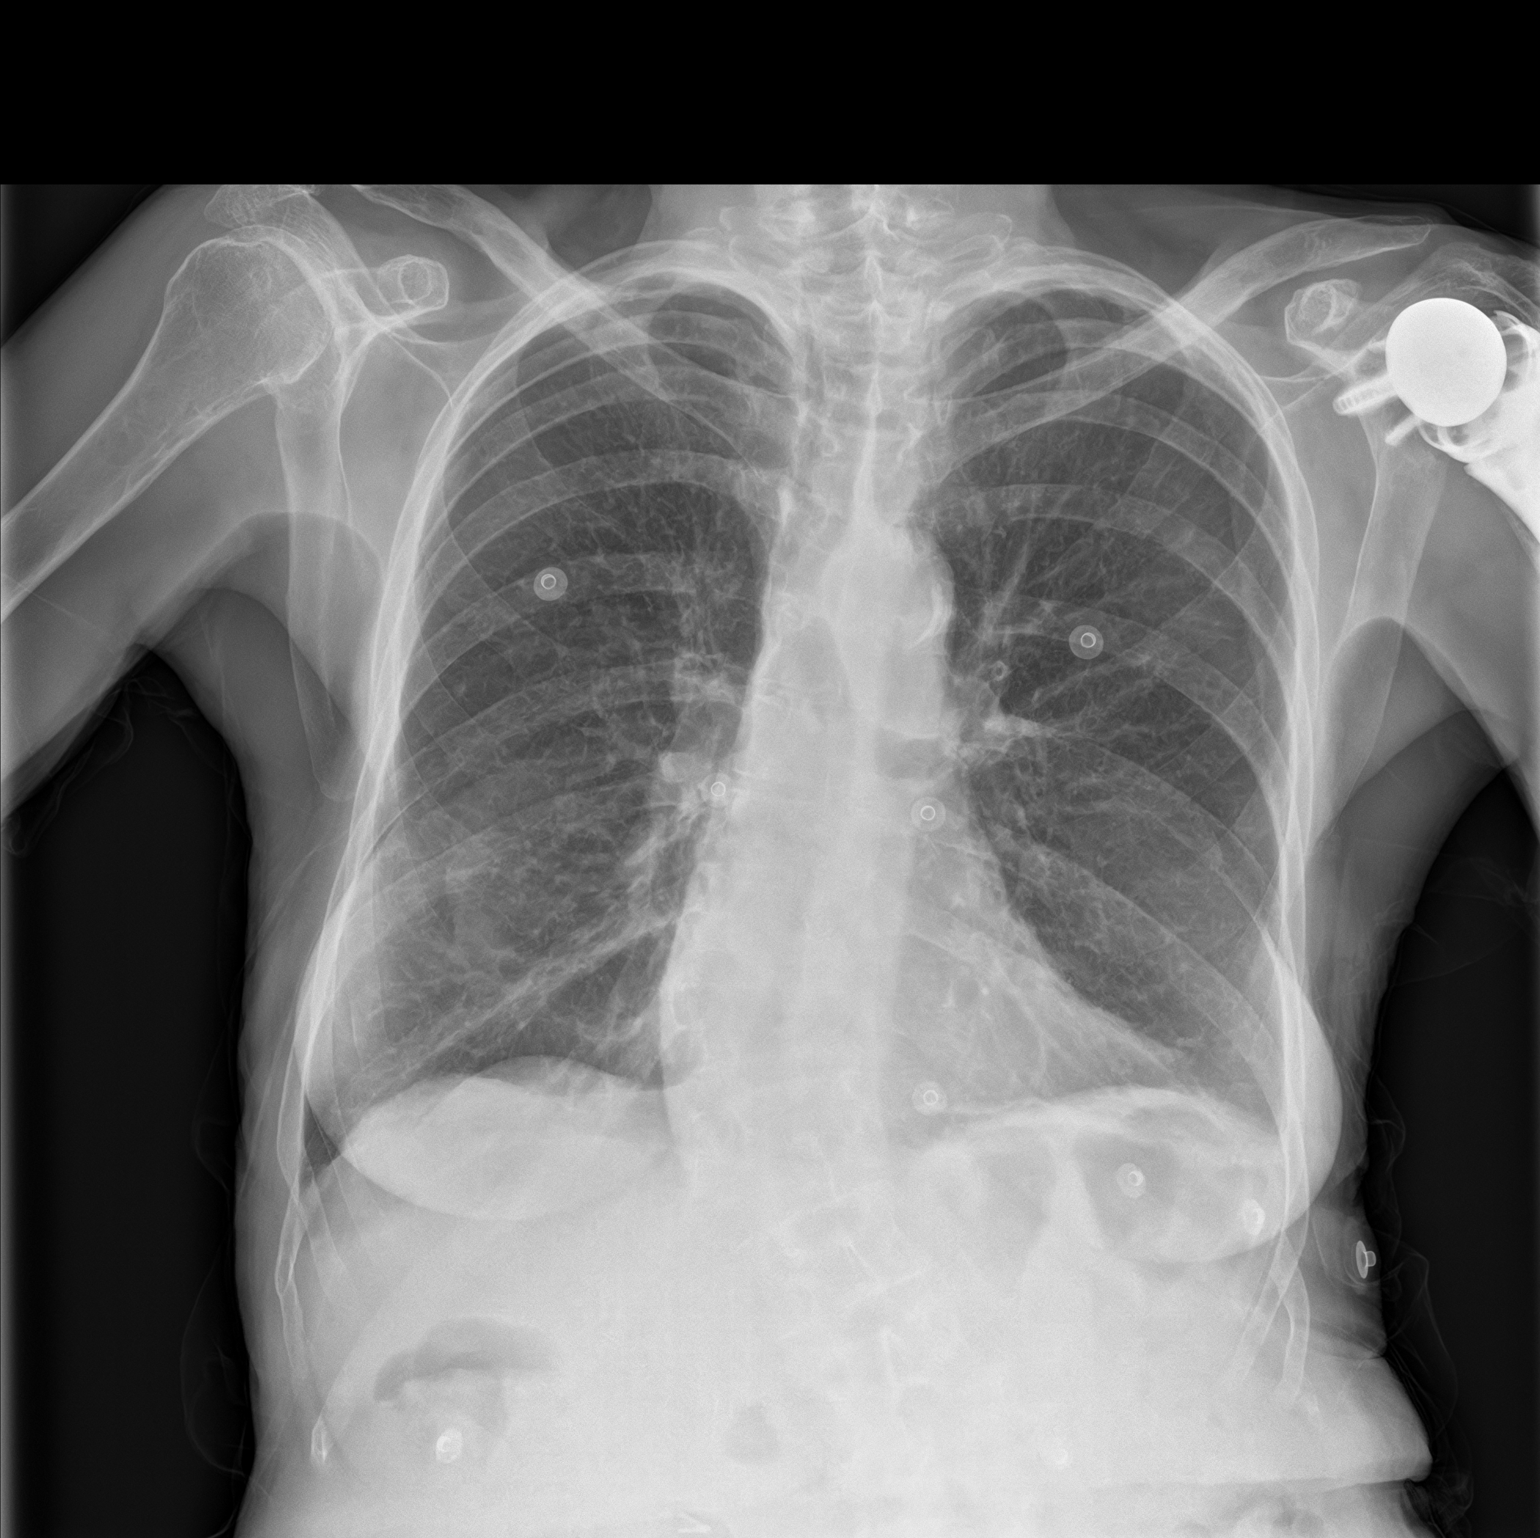
[im 2/2]
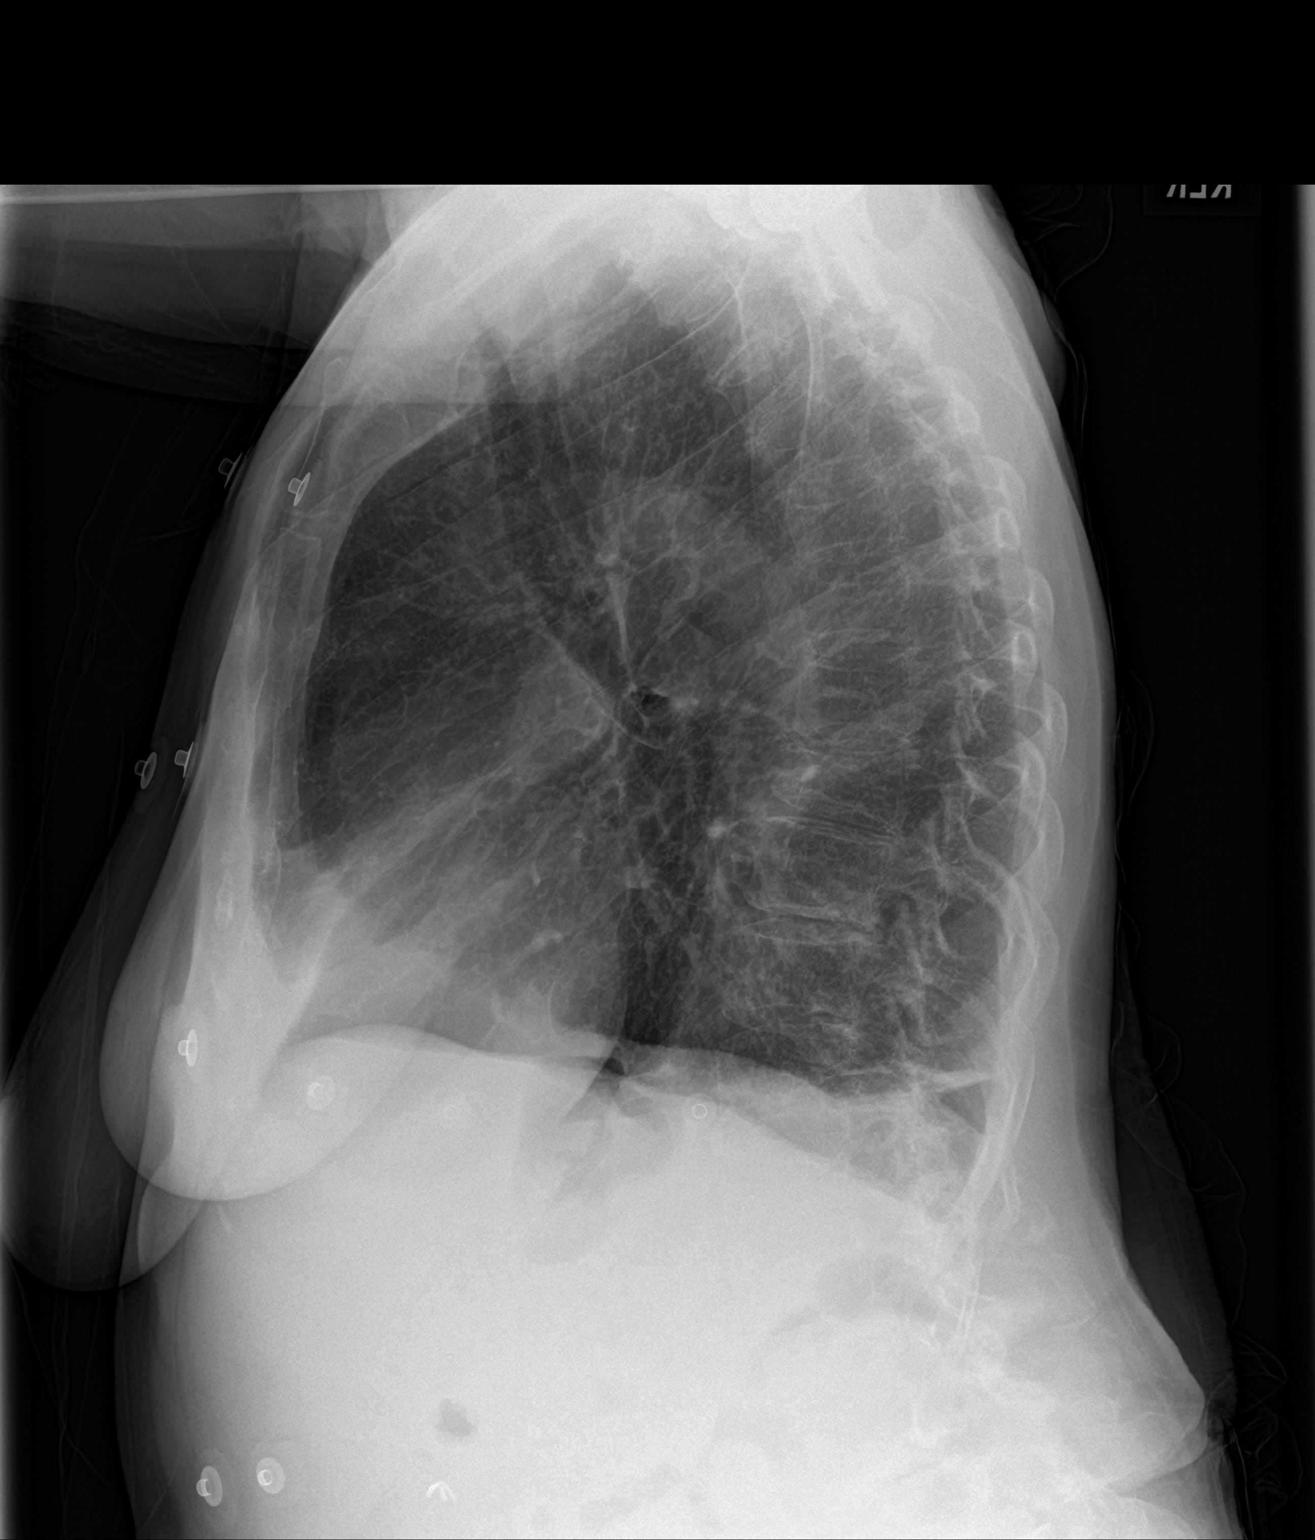

[2 of 2 positions shown; findings below may reference images not displayed]

FINDINGS: The cardiomediastinal silhouette is normal. There is calcified
atherosclerotic plaque of the aortic arch.

The lungs hyperinflated consistent with underlying COPD. Linear
opacities in the left lung base is favored to reflect atelectasis
and/or scar. Otherwise, there is no focal airspace disease. There is
no pulmonary edema. There is no pleural effusion or pneumothorax.

There is no acute osseous abnormality. No displaced rib fracture is
identified. Left shoulder arthroplasty is again noted. Thoracic
compression deformities and dextrocurvature are unchanged.
IMPRESSION: 1. No radiographic evidence of acute cardiopulmonary process.
2. COPD.

## 2021-06-25 MED ORDER — CELECOXIB 100 MG PO CAPS
100.0000 mg | ORAL_CAPSULE | Freq: Once | ORAL | Status: DC
  Filled 2021-06-25: qty 1

## 2021-06-25 MED ORDER — ACETAMINOPHEN 500 MG PO TABS
1000.0000 mg | ORAL_TABLET | Freq: Once | ORAL | Status: AC
Start: 2021-06-25 — End: 2021-06-25
  Administered 2021-06-25: 1000 mg via ORAL
  Filled 2021-06-25: qty 2

## 2021-06-25 NOTE — ED Triage Notes (Signed)
Pt here after dropping a microwave on her chest today. Pt c/o generalized CP. Pt states it is hard to breathe. Pt denies N/V.

## 2021-06-25 NOTE — ED Provider Notes (Signed)
[facility] Provider Note    Event Date/Time   First MD Initiated Contact with Patient 06/25/21 1156     (approximate)   History   Chest Injury   HPI  Suzanne Snyder is a 71 y.o. female with a past medical history of tobacco abuse, emphysema, DM, osteoporosis, asthma, anxiety and arthritis who presents for evaluation of central chest pain, left shoulder pain and some left wrist pain after she fell yesterday while trying to move a microwave.  She states he was attempting to move the microwave when it slid backwards hitting her in the chest causing her to fall backwards.  She states she hit her head left shoulder and left wrist.  She does not have any significant pain in the left elbow or other new acute back pain.  She has some chronic pain in the left shoulder secondary to multiple prior surgeries and did take a Celebrex this morning.  She states she has in the past tried Taryn gel and Tylenol and does not feel these have helped at all but has not tried them today.  She also states she has had lidocaine patches at home but does not want to try this today.  No other recent falls or sick symptoms such as fevers, chills, cough, vomiting, diarrhea, urinary symptoms or other recent falls or injuries.  She is not on anticoagulation other than intermittent ASA which she takes for pain.  I did counsel her that this was not an ideal pain medicine and she should discuss with her PCP if she needs to have her Celebrex increased for management of her chronic pain.  No other acute concerns at this time.    Past Medical History:  Diagnosis Date   Anxiety    Arthritis    Asthma    Diabetes mellitus without complication (HCC)    Insulin pump in place    Osteoporosis    RLS (restless legs syndrome)    Smokers' cough ([place])    Wears dentures    full upper and lower     Physical Exam  Triage Vital Signs: ED Triage Vitals  Enc Vitals Group     BP 06/25/21 1027 138/64      Pulse Rate 06/25/21 1025 76     Resp 06/25/21 1025 18     Temp 06/25/21 1025 99 F (37.2 C)     Temp Source 06/25/21 1025 Oral     SpO2 06/25/21 1025 94 %     Weight 06/25/21 1026 113 lb (51.3 kg)     Height 06/25/21 1026 5\' 8"  (1.727 m)     Head Circumference --      Peak Flow --      Pain Score 06/25/21 1026 8     Pain Loc --      Pain Edu? --      Excl. in [place]? --     Most recent vital signs: Vitals:   06/25/21 1025 06/25/21 1027  BP:  138/64  Pulse: 76   Resp: 18   Temp: 99 F (37.2 C)   SpO2: 94%     General: Awake, no distress.  CV:  Good peripheral perfusion.  2+ radial pulses. Resp:  Normal effort.  Clear bilaterally.  No rhonchi wheeze or wheezing. Abd:  No distention.  Soft. Other:  No point tenderness on the midline of the C/T/L-spine.  Patient has some soreness tenderness on palpation on range of motion of the left elbow shoulder but  there is no deformity or or large effusion.  There is no evidence of trauma to the left elbow.  Patient does have some snuffbox tenderness in the left wrist but sensation is intact in distribution of the radial ulnar and median nerves and she has full grip strength.  Right upper extremity is unremarkable.  No obvious trauma to the face scalp head or neck.  Patient is tender over her sternum without overlying skin changes.   ED Results / Procedures / Treatments  Labs (all labs ordered are listed, but only abnormal results are displayed) Labs Reviewed  BASIC METABOLIC PANEL - Abnormal; Notable for the following components:      Result Value   Sodium 133 (*)    Glucose, Bld 137 (*)    All other components within normal limits  CBC - Abnormal; Notable for the following components:   WBC 11.0 (*)    All other components within normal limits  TROPONIN I (HIGH SENSITIVITY)     EKG  ECG is markable for sinus rhythm with a ventricular rate of 79, normal axis, unremarkable intervals without clear evidence of acute ischemia or  significant arrhythmia otherwise a little bit isolated artifact in V3.   RADIOLOGY  Chest x-ray my interpretation without evidence of a pneumothorax, rib fracture, displaced sternal fracture, pneumothorax focal consolidation or other clear acute thoracic process.  I also reviewed radiology's interpretation and agree their findings of some evidence of underlying COPD and hyperinflation.  X-ray of the left shoulder and wrist show no acute fracture or dislocation on my interpretation.  I also reviewed radiology's interpretation over the phone as it is not coming through on epic.  CT head and C-spine my interpretation without evidence of a skull fracture, internal hemorrhage, edema, mass effect or acute C-spine injury.  I also reviewed radiology interpretation and agree with their findings of emphysema and some degenerative changes.   PROCEDURES:  Critical Care performed: No  Procedures    MEDICATIONS ORDERED IN ED: Medications  celecoxib (CELEBREX) capsule 100 mg (has no administration in time range)  acetaminophen (TYLENOL) tablet 1,000 mg (1,000 mg Oral Given 06/25/21 1250)     IMPRESSION / MDM / ASSESSMENT AND PLAN / ED COURSE  I reviewed the triage vital signs and the nursing notes. Patient's presentation is most consistent with acute presentation with potential threat to life or bodily function.                               Differential diagnosis includes, but is not limited to possible occult skull fracture or intracranial hemorrhage given patient reports she did hit her head in addition to possible occult C-spine injury, left shoulder contusion versus underlying occult fracture, left wrist and hand contusion versus possible scaphoid injury or other wrist or hand fracture as well as possible sternal fracture pneumothorax or rib fracture given tenderness over the sternum and blunt trauma to the chest.  ECG is markable for sinus rhythm with a ventricular rate of 79, normal axis,  unremarkable intervals without clear evidence of acute ischemia or significant arrhythmia otherwise a little bit isolated artifact in V3.  Nonelevated troponin is not suggestive of ACS myocarditis and a very low suspicion for significant cardiac contusion.  Chest x-ray my interpretation without evidence of a pneumothorax, rib fracture, displaced sternal fracture, pneumothorax focal consolidation or other clear acute thoracic process.  I also reviewed radiology's interpretation and agree their findings of some  evidence of underlying COPD and hyperinflation.  X-ray of the left shoulder and wrist show no acute fracture or dislocation on my interpretation.  I also reviewed radiology's interpretation over the phone as it is not coming through on epic.  CT head and C-spine my interpretation without evidence of a skull fracture, internal hemorrhage, edema, mass effect or acute C-spine injury.  I also reviewed radiology interpretation and agree with their findings of emphysema and some degenerative changes.  BMP shows no significant electrolyte or metabolic derangements.  CBC shows WC count of 11 likely reactive in the setting of trauma without evidence of acute anemia and normal platelets.  I suspect likely contusion to the anterior chest possibly some costochondritis as well as contusion to the left shoulder and wrist.  Patient does have some mild snuffbox tenderness and so it somewhat difficult to exclude a occult scaphoid injury.  Will place in thumb spica splint and have patient follow-up with orthopedics in 1 week for repeat imaging.  Counseled on tobacco cessation.  Discharged in stable condition peer strict return precautions advised and discussed.     FINAL CLINICAL IMPRESSION(S) / ED DIAGNOSES   Final diagnoses:  Blunt trauma to chest, initial encounter  Fall, initial encounter  Left wrist pain  Acute pain of left shoulder  Tobacco abuse     Rx / DC Orders   ED Discharge Orders      None        Note:  This document was prepared using Dragon voice recognition software and may include unintentional dictation errors.   Lucrezia Starch, MD 06/25/21 1435

## 2021-07-09 ENCOUNTER — Emergency Department: Payer: Medicare Other

## 2021-07-09 ENCOUNTER — Emergency Department
Admission: EM | Admit: 2021-07-09 | Discharge: 2021-07-09 | Disposition: A | Discharge status: Home / Self Care | Payer: Medicare Other | Attending: Emergency Medicine | Admitting: Emergency Medicine

## 2021-07-09 ENCOUNTER — Other Ambulatory Visit: Payer: Self-pay

## 2021-07-09 DIAGNOSIS — S2220XA Unspecified fracture of sternum, initial encounter for closed fracture: Secondary | ICD-10-CM | POA: Insufficient documentation

## 2021-07-09 DIAGNOSIS — S299XXA Unspecified injury of thorax, initial encounter: Secondary | ICD-10-CM | POA: Diagnosis present

## 2021-07-09 DIAGNOSIS — W208XXA Other cause of strike by thrown, projected or falling object, initial encounter: Secondary | ICD-10-CM | POA: Insufficient documentation

## 2021-07-09 DIAGNOSIS — S2241XA Multiple fractures of ribs, right side, initial encounter for closed fracture: Secondary | ICD-10-CM | POA: Insufficient documentation

## 2021-07-09 DIAGNOSIS — E109 Type 1 diabetes mellitus without complications: Secondary | ICD-10-CM | POA: Insufficient documentation

## 2021-07-09 DIAGNOSIS — J189 Pneumonia, unspecified organism: Secondary | ICD-10-CM | POA: Diagnosis not present

## 2021-07-09 DIAGNOSIS — S2249XA Multiple fractures of ribs, unspecified side, initial encounter for closed fracture: Secondary | ICD-10-CM

## 2021-07-09 LAB — BASIC METABOLIC PANEL
Anion gap: 10 (ref 5–15)
BUN: 21 mg/dL (ref 8–23)
CO2: 21 mmol/L — ABNORMAL LOW (ref 22–32)
Calcium: 9 mg/dL (ref 8.9–10.3)
Chloride: 102 mmol/L (ref 98–111)
Creatinine, Ser: 0.72 mg/dL (ref 0.44–1.00)
GFR, Estimated: >60 mL/min (ref >60)
Glucose, Bld: 267 mg/dL — ABNORMAL HIGH (ref 70–99)
Potassium: 4.1 mmol/L (ref 3.5–5.1)
Sodium: 133 mmol/L — ABNORMAL LOW (ref 135–145)

## 2021-07-09 LAB — CBC
HCT: 36.4 % (ref 36.0–46.0)
Hemoglobin: 11.7 g/dL — ABNORMAL LOW (ref 12.0–15.0)
MCH: 29.8 pg (ref 26.0–34.0)
MCHC: 32.1 g/dL (ref 30.0–36.0)
MCV: 92.9 fL (ref 80.0–100.0)
Platelets: 294 10*3/uL (ref 150–400)
RBC: 3.92 MIL/uL (ref 3.87–5.11)
RDW: 13.7 % (ref 11.5–15.5)
WBC: 9.5 10*3/uL (ref 4.0–10.5)
nRBC: 0 % (ref 0.0–0.2)

## 2021-07-09 MED ORDER — DOXYCYCLINE HYCLATE 100 MG PO CAPS: 100.0000 mg | ORAL_CAPSULE | Freq: Two times a day (BID) | ORAL | 0 refills | Status: AC

## 2021-07-09 MED ORDER — DOXYCYCLINE HYCLATE 100 MG PO TABS
100.0000 mg | ORAL_TABLET | Freq: Once | ORAL | Status: AC
  Administered 2021-07-09: 100 mg via ORAL
  Filled 2021-07-09: qty 1

## 2021-07-09 MED ORDER — AMOXICILLIN-POT CLAVULANATE 875-125 MG PO TABS
1.0000 | ORAL_TABLET | Freq: Once | ORAL | Status: AC
  Administered 2021-07-09: 1 via ORAL
  Filled 2021-07-09: qty 1

## 2021-07-09 MED ORDER — AMOXICILLIN-POT CLAVULANATE 875-125 MG PO TABS: 1.0000 | ORAL_TABLET | Freq: Two times a day (BID) | ORAL | 0 refills | Status: AC

## 2021-07-09 MED ORDER — HYDROCODONE-ACETAMINOPHEN 5-325 MG PO TABS: 1.0000 | ORAL_TABLET | Freq: Four times a day (QID) | ORAL | 0 refills | Status: DC | PRN

## 2021-07-09 MED ORDER — OXYCODONE-ACETAMINOPHEN 5-325 MG PO TABS
1.0000 | ORAL_TABLET | Freq: Once | ORAL | Status: AC
  Administered 2021-07-09: 1 via ORAL
  Filled 2021-07-09: qty 1

## 2021-07-09 NOTE — ED Notes (Signed)
Pt advised she has seen her PCP about this same issue. Her PCP informed her it would take awhile to get better. Pt sees Harris at [facility]. Pt saw him on 6/16.

## 2021-07-09 NOTE — ED Triage Notes (Signed)
Pt here with CP that started 2 weeks ago when a microwave fell on her chest, pt was seen here for prior injury. Pt states the pain has gotten much worse and it is harder for her to breathe. Pt also having a lot of congestion.

## 2021-07-09 NOTE — ED Provider Notes (Signed)
[facility] Provider Note    Event Date/Time   First MD Initiated Contact with Patient 07/09/21 1006     (approximate)   History   Chest Pain   HPI  Suzanne Snyder is a 71 y.o. female with a past medical history of type 1 diabetes   Reviewed previous note including from provider Dr. Hulan Saas patient was evaluated recently after injury from a microwave falling and was diagnosed with blunt chest trauma and left wrist pain at that time  She has had no further injuries.  Over the last week she started develop a cough and when she coughs she gets pain over her left chest and also occasionally it is productive.  No wheezing.  She reports she feels just slightly short of breath.  Mostly having pain when she coughs a little bit over the right chest but some mostly along the left lower  Physical Exam   Triage Vital Signs: ED Triage Vitals  Enc Vitals Group     BP 07/09/21 0958 124/63     Pulse Rate 07/09/21 0958 76     Resp 07/09/21 0958 17     Temp 07/09/21 0958 98.2 F (36.8 C)     Temp Source 07/09/21 0958 Oral     SpO2 07/09/21 0958 96 %     Weight 07/09/21 0947 113 lb 1.5 oz (51.3 kg)     Height 07/09/21 0947 5\' 8"  (1.727 m)     Head Circumference --      Peak Flow --      Pain Score 07/09/21 0947 7     Pain Loc --      Pain Edu? --      Excl. in [place]? --     Most recent vital signs: Vitals:   07/09/21 1200 07/09/21 1300  BP: 126/78 122/70  Pulse: 70 62  Resp: 16 16  Temp:  98 F (36.7 C)  SpO2: 100% 100%     General: Awake, no distress.  Occasional nonproductive cough.  Reports pleuritic pain along her left lower rib cage with coughing CV:  Good peripheral perfusion.  Normal heart tones.  No murmurs or rubs Resp:  Normal effort.  Some splinting with deep inspiration causes pain along the left lateral rib cage.  Her work of breathing at rest is normal, but when asked to take a deep breath she does have splinting reporting a pain along  the left lower chest wall.  Does not appear acutely dyspneic.  Normal oxygen saturation on room air.  No accessory muscle use Abd:  No distention.  Soft nontender nondistended throughout, birthmark noted over the left upper abdominal wall without any bruising noted to the left chest wall Other:  No lower extremity edema.  No venous cords or congestion no calf tenderness or evidence of at suggest DVT   ED Results / Procedures / Treatments   Labs (all labs ordered are listed, but only abnormal results are displayed) Labs Reviewed  BASIC METABOLIC PANEL - Abnormal; Notable for the following components:      Result Value   Sodium 133 (*)    CO2 21 (*)    Glucose, Bld 267 (*)    All other components within normal limits  CBC - Abnormal; Notable for the following components:   Hemoglobin 11.7 (*)    All other components within normal limits  TROPONIN I (HIGH SENSITIVITY)     EKG  Interpreted by me at 950 heart rate  80 QRS 80 QTc 450 normal sinus rhythm no evidence of acute ischemia or ectopy.  Slight baseline artifact   RADIOLOGY Personally interpreted the patient's chest x-ray is negative for pneumothorax or obvious rib fracture, however left lower lobe infiltrate is noted   CT of the chest read by radiologist reviewed by me   1. Multifocal left-sided pulmonary opacities, most consistent with infection. A left upper lobe area of probable nodular consolidation is indeterminate. Recommend antibiotic therapy and CT follow-up at 2-3 months. 2. Smaller right upper lobe pulmonary nodules. Recommend attention on follow-up. 3. Sternal body and anterior right rib fractures. No pneumothorax or other acute complication. 4. Osteopenia with T5 and T9 compression deformities, felt to be similar to 03/26/2018 chest radiograph. 5. Aortic atherosclerosis (ICD10-I70.0) and emphysema (ICD10-J43.9).  PROCEDURES:  Critical Care performed: No  Procedures   MEDICATIONS ORDERED IN  ED: Medications  oxyCODONE-acetaminophen (PERCOCET/ROXICET) 5-325 MG per tablet 1 tablet (1 tablet Oral Given 07/09/21 1042)  amoxicillin-clavulanate (AUGMENTIN) 875-125 MG per tablet 1 tablet (1 tablet Oral Given 07/09/21 1042)  doxycycline (VIBRA-TABS) tablet 100 mg (100 mg Oral Given 07/09/21 1042)     IMPRESSION / MDM / ASSESSMENT AND PLAN / ED COURSE  I reviewed the triage vital signs and the nursing notes.                              Differential diagnosis includes, but is not limited to, developed pneumonia, rib fractures, sternal fracture, musculoskeletal pain, pneumothorax, pleural effusion hemothorax pericardial effusion etc.    Patient's presentation is most consistent with acute illness / injury with system symptoms.  The patient is on the cardiac monitor to evaluate for evidence of arrhythmia and/or significant heart rate changes.  Patient's work-up revealing of rib fractures and sternal fracture, these appear to be of the healing nature after her injury that occurred a couple weeks ago. She now also has left-sided pneumonia.  She however does not have evidence of severe pneumonia or hypoxia or increased work of breathing at this time.  Her pain is improved with treatment here.  Discussed with patient as well as her husband, will start on antibiotic therapy, incentive spirometry, prescribed brief course of hydrocodone for pain control.  Patient reports that she will secure her hydrocodone as they have a daughter with an addiction problem, but they have a means to safely secure the hydrocodone so her daughter cannot access it  She also agrees not to mix hydrocodone with her prescription clonazepam or other sedating medications.  ----------------------------------------- 2:04 PM on 07/09/2021 ----------------------------------------- Resting comfortably, pain controlled, discussed imaging findings including pneumonia, rib fractures or sternal injury, and recommended follow-up.   Placed referral to pulmonary nodule clinic as well.  Patient agreeable with planning up follow-up with Dr. Laural Benes and I discussed very careful return precautions.  If the patient is to have increasing symptoms, have difficulty breathing, severe chest pain develop fevers or feel her course is worsening  she will return to the emergency department   Return precautions and treatment recommendations and follow-up discussed with the patient who is agreeable with the plan.   FINAL CLINICAL IMPRESSION(S) / ED DIAGNOSES   Final diagnoses:  Closed fracture of sternum, unspecified portion of sternum, initial encounter  Closed fracture of multiple ribs, unspecified laterality, initial encounter  Community acquired pneumonia of left lung, unspecified part of lung     Rx / DC Orders   ED Discharge Orders  Ordered    AMB  Referral to Pulmonary Nodule Clinic        07/09/21 1354    amoxicillin-clavulanate (AUGMENTIN) 875-125 MG tablet  2 times daily        07/09/21 1402    doxycycline (VIBRAMYCIN) 100 MG capsule  2 times daily        07/09/21 1402    HYDROcodone-acetaminophen (NORCO/VICODIN) 5-325 MG tablet  Every 6 hours PRN        07/09/21 1402             Note:  This document was prepared using Dragon voice recognition software and may include unintentional dictation errors.   Sharyn Creamer, MD 07/09/21 1407

## 2021-07-10 ENCOUNTER — Telehealth: Payer: Self-pay | Admitting: Emergency Medicine

## 2021-07-10 MED ORDER — HYDROCODONE-ACETAMINOPHEN 5-325 MG PO TABS: 1.0000 | ORAL_TABLET | Freq: Four times a day (QID) | ORAL | 0 refills | Status: DC | PRN

## 2021-07-10 NOTE — Telephone Encounter (Signed)
Patient's pharmacy, [facility] called and advised there was an error in receiving prescription for hydrocodone.  Will need to be resent

## 2021-08-26 ENCOUNTER — Institutional Professional Consult (permissible substitution): Payer: Medicare Other | Admitting: Pulmonary Disease

## 2021-08-28 ENCOUNTER — Emergency Department: Payer: Medicare Other

## 2021-08-28 ENCOUNTER — Other Ambulatory Visit: Payer: Self-pay

## 2021-08-28 ENCOUNTER — Emergency Department
Admission: EM | Admit: 2021-08-28 | Discharge: 2021-08-28 | Disposition: A | Discharge status: Home / Self Care | Payer: Medicare Other | Attending: Emergency Medicine | Admitting: Emergency Medicine

## 2021-08-28 DIAGNOSIS — E1065 Type 1 diabetes mellitus with hyperglycemia: Secondary | ICD-10-CM | POA: Insufficient documentation

## 2021-08-28 DIAGNOSIS — J45909 Unspecified asthma, uncomplicated: Secondary | ICD-10-CM | POA: Diagnosis not present

## 2021-08-28 DIAGNOSIS — G8918 Other acute postprocedural pain: Secondary | ICD-10-CM | POA: Insufficient documentation

## 2021-08-28 DIAGNOSIS — R1084 Generalized abdominal pain: Secondary | ICD-10-CM | POA: Diagnosis present

## 2021-08-28 LAB — CBC
HCT: 36.3 % (ref 36.0–46.0)
Hemoglobin: 11.7 g/dL — ABNORMAL LOW (ref 12.0–15.0)
MCH: 30.2 pg (ref 26.0–34.0)
MCHC: 32.2 g/dL (ref 30.0–36.0)
MCV: 93.8 fL (ref 80.0–100.0)
Platelets: 259 10*3/uL (ref 150–400)
RBC: 3.87 MIL/uL (ref 3.87–5.11)
RDW: 14.7 % (ref 11.5–15.5)
WBC: 8.4 10*3/uL (ref 4.0–10.5)
nRBC: 0 % (ref 0.0–0.2)

## 2021-08-28 LAB — URINALYSIS, ROUTINE W REFLEX MICROSCOPIC
Bilirubin Urine: NEGATIVE
Glucose, UA: NEGATIVE mg/dL
Hgb urine dipstick: NEGATIVE
Ketones, ur: NEGATIVE mg/dL
Nitrite: NEGATIVE
Protein, ur: NEGATIVE mg/dL
Specific Gravity, Urine: 1.014 (ref 1.005–1.030)
pH: 6 (ref 5.0–8.0)

## 2021-08-28 LAB — BASIC METABOLIC PANEL
Anion gap: 9 (ref 5–15)
BUN: 30 mg/dL — ABNORMAL HIGH (ref 8–23)
CO2: 21 mmol/L — ABNORMAL LOW (ref 22–32)
Calcium: 8.6 mg/dL — ABNORMAL LOW (ref 8.9–10.3)
Chloride: 102 mmol/L (ref 98–111)
Creatinine, Ser: 0.93 mg/dL (ref 0.44–1.00)
GFR, Estimated: >60 mL/min (ref >60)
Glucose, Bld: 284 mg/dL — ABNORMAL HIGH (ref 70–99)
Potassium: 4.1 mmol/L (ref 3.5–5.1)
Sodium: 132 mmol/L — ABNORMAL LOW (ref 135–145)

## 2021-08-28 NOTE — ED Provider Notes (Signed)
[facility] Provider Note    Event Date/Time   First MD Initiated Contact with Patient 08/28/21 [phone]     (approximate)   History   Post-op Problem   HPI  Suzanne Snyder is a 71 y.o. female past medical history of rectal prolapse status post proctopexy on 8/9 from home with drainage from one of her procedures.  Patient had the procedure done in 8/9.  Has a long history of rectal prolapse has had several procedures in the past.  2 days ago she noticed that one of the laparotomy sites seem to burst and she is having some clear drainage from that site.  She has some generalized abdominal pain she is having bowel movements no fevers or chills no nausea vomiting.  She has a follow-up appointment at [facility] where she had the procedure done in 11 days.     Past Medical History:  Diagnosis Date   Anxiety    Arthritis    Asthma    Diabetes mellitus without complication (HCC)    Insulin pump in place    Osteoporosis    RLS (restless legs syndrome)    Smokers' cough (HCC)    Wears dentures    full upper and lower    Patient Active Problem List   Diagnosis Date Noted   Cellulitis of right wrist 12/21/2020   Cellulitis of right thigh 12/11/2020   Type 1 diabetes mellitus with hypoglycemia without coma (HCC) 12/11/2020   Hypoglycemia 12/10/2020     Physical Exam  Triage Vital Signs: ED Triage Vitals  Enc Vitals Group     BP 08/28/21 0817 (!) 144/57     Pulse Rate 08/28/21 0817 68     Resp 08/28/21 0817 18     Temp 08/28/21 0817 98.9 F (37.2 C)     Temp src --      SpO2 08/28/21 0817 96 %     Weight --      Height --      Head Circumference --      Peak Flow --      Pain Score 08/28/21 0810 0     Pain Loc --      Pain Edu? --      Excl. in [place]? --     Most recent vital signs: Vitals:   08/28/21 0817  BP: (!) 144/57  Pulse: 68  Resp: 18  Temp: 98.9 F (37.2 C)  SpO2: 96%     General: Awake, no distress.  CV:  Good peripheral perfusion.   Resp:  Normal effort.  Abd:  No distention.  Multiple laparotomy sites the infraumbilical laparotomy site is just slightly dehisced with some clear serous drainage abdomen mildly tender throughout but benign, other sites are clean dry and intact Neuro:             Awake, Alert, Oriented x 3  Other:     ED Results / Procedures / Treatments  Labs (all labs ordered are listed, but only abnormal results are displayed) Labs Reviewed  CBC - Abnormal; Notable for the following components:      Result Value   Hemoglobin 11.7 (*)    All other components within normal limits  BASIC METABOLIC PANEL - Abnormal; Notable for the following components:   Sodium 132 (*)    CO2 21 (*)    Glucose, Bld 284 (*)    BUN 30 (*)    Calcium 8.6 (*)    All other components  within normal limits  URINALYSIS, ROUTINE W REFLEX MICROSCOPIC     EKG    RADIOLOGY I reviewed and interpreted the CT of the abdomen pelvis which is consistent with recent surgery and constipation   PROCEDURES:  Critical Care performed: No  Procedures   MEDICATIONS ORDERED IN ED: Medications - No data to display   IMPRESSION / MDM / ASSESSMENT AND PLAN / ED COURSE  I reviewed the triage vital signs and the nursing notes.                              Patient's presentation is most consistent with acute presentation with potential threat to life or bodily function.  Differential diagnosis includes, but is not limited to, superficial wound dehiscence, postoperative seroma, postoperative hernia  Patient is a 71 year old female presenting with serous drainage from one of her laparotomy sites from recent proctopexy for rectal prolapse.  This happened 2 days ago.  On exam the infraumbilical laparotomy site is just slightly opened does not appear completely dehisced there is some serous drainage.  This looks superficial to me.  However given she is having some abdominal tenderness and there is some serous drainage will obtain  CT to rule out significant seroma or other complication.  Patient's labs including CBC and CMP are reassuring other than mild hyperglycemia with no evidence of DKA.     CT abdomen pelvis is essentially consistent with postoperative changes and constipation.  There is no acute surgical process identified.  I put some Dermabond on the superficially dehisced laparotomy site.  I placed Dermabond on the incision that was draining.  Patient is appropriate to follow-up as an outpatient.    FINAL CLINICAL IMPRESSION(S) / ED DIAGNOSES   Final diagnoses:  Post-operative pain     Rx / DC Orders   ED Discharge Orders     None        Note:  This document was prepared using Dragon voice recognition software and may include unintentional dictation errors.   Georga Hacking, MD 08/28/21 1218

## 2021-08-28 NOTE — ED Notes (Signed)
Pt verbalized understanding of discharge instructions. Opportunity for questions provided.  

## 2021-08-28 NOTE — ED Triage Notes (Addendum)
Pt comes with c/o post op problem. Pt states procedure last Wednesday. Pt states drainage two days ago. Pt states mixture of blood and drainage.   Pt states pain in belly. Pt falling asleep in triage. Pt states she is just in pain.

## 2021-09-17 ENCOUNTER — Ambulatory Visit (INDEPENDENT_AMBULATORY_CARE_PROVIDER_SITE_OTHER): Payer: Medicare Other | Admitting: Dermatology

## 2021-09-17 DIAGNOSIS — R21 Rash and other nonspecific skin eruption: Secondary | ICD-10-CM | POA: Diagnosis not present

## 2021-09-17 DIAGNOSIS — L209 Atopic dermatitis, unspecified: Secondary | ICD-10-CM | POA: Diagnosis not present

## 2021-09-17 DIAGNOSIS — L299 Pruritus, unspecified: Secondary | ICD-10-CM

## 2021-09-17 DIAGNOSIS — L309 Dermatitis, unspecified: Secondary | ICD-10-CM

## 2021-09-17 MED ORDER — DOXEPIN HCL 10 MG PO CAPS: ORAL_CAPSULE | ORAL | 0 refills | Status: DC

## 2021-09-17 MED ORDER — CLOBETASOL PROPIONATE 0.05 % EX CREA: TOPICAL_CREAM | CUTANEOUS | 0 refills | Status: DC

## 2021-09-17 MED ORDER — MOMETASONE FUROATE 0.1 % EX CREA: TOPICAL_CREAM | CUTANEOUS | 0 refills | Status: DC

## 2021-09-17 NOTE — Patient Instructions (Signed)
Due to recent changes in healthcare laws, you may see results of your pathology and/or laboratory studies on MyChart before the doctors have had a chance to review them. We understand that in some cases there may be results that are confusing or concerning to you. Please understand that not all results are received at the same time and often the doctors may need to interpret multiple results in order to provide you with the best plan of care or course of treatment. Therefore, we ask that you please give us 2 business days to thoroughly review all your results before contacting the office for clarification. Should we see a critical lab result, you will be contacted sooner.   If You Need Anything After Your Visit  If you have any questions or concerns for your doctor, please call our main line at [phone] and press option 4 to reach your doctor's medical assistant. If no one answers, please leave a voicemail as directed and we will return your call as soon as possible. Messages left after 4 pm will be answered the following business day.   You may also send us a message via MyChart. We typically respond to MyChart messages within 1-2 business days.  For prescription refills, please ask your pharmacy to contact our office. Our fax number is [phone].  If you have an urgent issue when the clinic is closed that cannot wait until the next business day, you can page your doctor at the number below.    Please note that while we do our best to be available for urgent issues outside of office hours, we are not available 24/7.   If you have an urgent issue and are unable to reach us, you may choose to seek medical care at your doctor's office, retail clinic, urgent care center, or emergency room.  If you have a medical emergency, please immediately call 911 or go to the emergency department.  Pager Numbers  - Dr. Kowalski: [phone]  - Dr. Moye: [phone]  - Dr. Stewart:  [phone]  In the event of inclement weather, please call our main line at [phone] for an update on the status of any delays or closures.  Dermatology Medication Tips: Please keep the boxes that topical medications come in in order to help keep track of the instructions about where and how to use these. Pharmacies typically print the medication instructions only on the boxes and not directly on the medication tubes.   If your medication is too expensive, please contact our office at [phone] option 4 or send us a message through MyChart.   We are unable to tell what your co-pay for medications will be in advance as this is different depending on your insurance coverage. However, we may be able to find a substitute medication at lower cost or fill out paperwork to get insurance to cover a needed medication.   If a prior authorization is required to get your medication covered by your insurance company, please allow us 1-2 business days to complete this process.  Drug prices often vary depending on where the prescription is filled and some pharmacies may offer cheaper prices.  The website www.goodrx.com contains coupons for medications through different pharmacies. The prices here do not account for what the cost may be with help from insurance (it may be cheaper with your insurance), but the website can give you the price if you did not use any insurance.  - You can print the associated coupon and take it with   your prescription to the pharmacy.  - You may also stop by our office during regular business hours and pick up a GoodRx coupon card.  - If you need your prescription sent electronically to a different pharmacy, notify our office through [facility] MyChart or by phone at [phone] option 4.     Si Usted Necesita Algo Despus de Su Visita  Tambin puede enviarnos un mensaje a travs de MyChart. Por lo general respondemos a los mensajes de MyChart en el transcurso de 1 a 2  das hbiles.  Para renovar recetas, por favor pida a su farmacia que se ponga en contacto con nuestra oficina. Nuestro nmero de fax es el [phone].  Si tiene un asunto urgente cuando la clnica est cerrada y que no puede esperar hasta el siguiente da hbil, puede llamar/localizar a su doctor(a) al nmero que aparece a continuacin.   Por favor, tenga en cuenta que aunque hacemos todo lo posible para estar disponibles para asuntos urgentes fuera del horario de oficina, no estamos disponibles las 24 horas del da, los 7 das de la semana.   Si tiene un problema urgente y no puede comunicarse con nosotros, puede optar por buscar atencin mdica  en el consultorio de su doctor(a), en una clnica privada, en un centro de atencin urgente o en una sala de emergencias.  Si tiene una emergencia mdica, por favor llame inmediatamente al 911 o vaya a la sala de emergencias.  Nmeros de bper  - Dr. Kowalski: [phone]  - Dra. Moye: [phone]  - Dra. Stewart: [phone]  En caso de inclemencias del tiempo, por favor llame a nuestra lnea principal al [phone] para una actualizacin sobre el estado de cualquier retraso o cierre.  Consejos para la medicacin en dermatologa: Por favor, guarde las cajas en las que vienen los medicamentos de uso tpico para ayudarle a seguir las instrucciones sobre dnde y cmo usarlos. Las farmacias generalmente imprimen las instrucciones del medicamento slo en las cajas y no directamente en los tubos del medicamento.   Si su medicamento es muy caro, por favor, pngase en contacto con nuestra oficina llamando al [phone] y presione la opcin 4 o envenos un mensaje a travs de MyChart.   No podemos decirle cul ser su copago por los medicamentos por adelantado ya que esto es diferente dependiendo de la cobertura de su seguro. Sin embargo, es posible que podamos encontrar un medicamento sustituto a menor costo o llenar un formulario para que el  seguro cubra el medicamento que se considera necesario.   Si se requiere una autorizacin previa para que su compaa de seguros cubra su medicamento, por favor permtanos de 1 a 2 das hbiles para completar este proceso.  Los precios de los medicamentos varan con frecuencia dependiendo del lugar de dnde se surte la receta y alguna farmacias pueden ofrecer precios ms baratos.  El sitio web www.goodrx.com tiene cupones para medicamentos de diferentes farmacias. Los precios aqu no tienen en cuenta lo que podra costar con la ayuda del seguro (puede ser ms barato con su seguro), pero el sitio web puede darle el precio si no utiliz ningn seguro.  - Puede imprimir el cupn correspondiente y llevarlo con su receta a la farmacia.  - Tambin puede pasar por nuestra oficina durante el horario de atencin regular y recoger una tarjeta de cupones de GoodRx.  - Si necesita que su receta se enve electrnicamente a una farmacia diferente, informe a nuestra oficina a travs de MyChart de [place]   o por telfono llamando al [phone] y presione la opcin 4.  

## 2021-09-17 NOTE — Progress Notes (Signed)
   Follow-Up Visit   Subjective  Suzanne Snyder is a 71 y.o. female who presents for the following: Rash  (Around eyes, hands, elbows, arms - breaking out for weeks now. Has been using A&D OTC ointment, TMC 0.1% and Mupirocin 2% ointment but continues to flare. Stress with family life and recent surgery on August 8th. ). Pt has seen Dr Roseanne Reno in past.  The following portions of the chart were reviewed this encounter and updated as appropriate:   Tobacco  Allergies  Meds  Problems  Med Hx  Surg Hx  Fam Hx     Review of Systems:  No other skin or systemic complaints except as noted in HPI or Assessment and Plan.  Objective  Well appearing patient in no apparent distress; mood and affect are within normal limits.  A focused examination was performed including face, arms, hands. Relevant physical exam findings are noted in the Assessment and Plan.  B/L hands, elbows, arms, eyelids                      Assessment & Plan  Atopic dermatitis, B/L hands, elbows, arms, eyelids Eczema with flare from contact dermatitis - with severe itch  Start Clobetasol 0.05% cream BID x 2 weeks.  Start Mometasone 0.1% cream to aa's eyelids BID x 2 weeks.   Start Doxepin 10mg  po QHS prn itch #14 0RF. Will make drowsy.   Atopic dermatitis (eczema) is a chronic, relapsing, pruritic condition that can significantly affect quality of life. It is often associated with allergic rhinitis and/or asthma and can require treatment with topical medications, phototherapy, or in severe cases biologic injectable medication (Dupixent; Adbry) or Oral JAK inhibitors.  Topical steroids (such as triamcinolone, fluocinolone, fluocinonide, mometasone, clobetasol, halobetasol, betamethasone, hydrocortisone) can cause thinning and lightening of the skin if they are used for too long in the same area. Your physician has selected the right strength medicine for your problem and area affected on the body.  Please use your medication only as directed by your physician to prevent side effects.   Consider Dupixent if not improved at follow up.  Consider patch testing in future.  doxepin (SINEQUAN) 10 MG capsule - B/L hands, elbows, arms, eyelids Take one cap po QHS PRN itch.  clobetasol cream (TEMOVATE) 0.05 % - B/L hands, elbows, arms, eyelids Apply to aa's rash on the hands and elbows BID for two weeks. Avoid applying to face, groin, and axilla. Use as directed. Long-term use can cause thinning of the skin.  mometasone (ELOCON) 0.1 % cream - B/L hands, elbows, arms, eyelids Apply to aa's eyelids BID for two weeks PRN.  Return for rash follow up in 3-6 weeks.  , CMA, am acting as scribe for Maylene Roes, MD .Documentation: I have reviewed the above documentation for accuracy and completeness, and I agree with the above.  Armida Sans, MD

## 2021-09-19 ENCOUNTER — Encounter: Payer: Self-pay | Admitting: Dermatology

## 2021-10-15 ENCOUNTER — Ambulatory Visit: Payer: Medicare Other | Admitting: Dermatology

## 2021-11-27 ENCOUNTER — Ambulatory Visit
Payer: Medicare Other | Attending: Student in an Organized Health Care Education/Training Program | Admitting: Student in an Organized Health Care Education/Training Program

## 2021-11-27 ENCOUNTER — Encounter: Payer: Self-pay | Admitting: Student in an Organized Health Care Education/Training Program

## 2021-11-27 VITALS — BP 145/68 | HR 70 | Temp 97.3°F | Ht 68.0 in | Wt 113.0 lb

## 2021-11-27 DIAGNOSIS — M5412 Radiculopathy, cervical region: Secondary | ICD-10-CM | POA: Insufficient documentation

## 2021-11-27 DIAGNOSIS — M47816 Spondylosis without myelopathy or radiculopathy, lumbar region: Secondary | ICD-10-CM | POA: Insufficient documentation

## 2021-11-27 DIAGNOSIS — M47812 Spondylosis without myelopathy or radiculopathy, cervical region: Secondary | ICD-10-CM | POA: Insufficient documentation

## 2021-11-27 DIAGNOSIS — G8929 Other chronic pain: Secondary | ICD-10-CM | POA: Insufficient documentation

## 2021-11-27 DIAGNOSIS — Z96612 Presence of left artificial shoulder joint: Secondary | ICD-10-CM | POA: Insufficient documentation

## 2021-11-27 DIAGNOSIS — G894 Chronic pain syndrome: Secondary | ICD-10-CM | POA: Insufficient documentation

## 2021-11-27 DIAGNOSIS — M25512 Pain in left shoulder: Secondary | ICD-10-CM | POA: Insufficient documentation

## 2021-11-27 DIAGNOSIS — M5136 Other intervertebral disc degeneration, lumbar region: Secondary | ICD-10-CM | POA: Insufficient documentation

## 2021-11-27 NOTE — Progress Notes (Signed)
Safety precautions to be maintained throughout the outpatient stay will include: orient to surroundings, keep bed in low position, maintain call bell within reach at all times, provide assistance with transfer out of bed and ambulation.  

## 2021-11-27 NOTE — Progress Notes (Signed)
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 tender to palpation             Upper Extremity (UE) Exam    Side: Right upper extremity  Side: Left upper extremity  Skin & Extremity Inspection: Skin color, temperature, and hair growth are WNL. No peripheral edema or cyanosis. No masses, redness, swelling, asymmetry, or associated skin lesions. No contractures.  Skin & Extremity Inspection: Evidence of prior arthroplastic surgery  Functional ROM: Unrestricted ROM          Functional ROM: Pain restricted ROM for shoulder  Muscle Tone/Strength: Functionally intact. No obvious neuro-muscular anomalies detected.  Muscle Tone/Strength: Functionally intact. No obvious neuro-muscular anomalies detected.  Sensory (Neurological): Unimpaired          Sensory (Neurological): Arthropathic  arthralgia          Palpation: No palpable anomalies              Palpation: No palpable anomalies              Provocative Test(s):  Phalen's test: deferred Tinel's test: deferred Apley's scratch test (touch opposite shoulder):  Action 1 (Across chest): deferred Action 2 (Overhead): deferred Action 3 (LB reach): deferred   Provocative Test(s):  Phalen's test: deferred Tinel's test: deferred Apley's scratch test (touch opposite shoulder):  Action 1 (Across chest): Decreased ROM Action 2 (Overhead): Decreased ROM Action 3 (LB reach): Decreased ROM    Lumbar Spine Area Exam  Skin & Axial Inspection: Well healed scar from previous spine surgery detected Alignment: Asymmetric Functional ROM: Pain restricted ROM       Stability: No instability detected Muscle Tone/Strength: Functionally intact. No obvious neuro-muscular anomalies detected. Sensory (Neurological): Musculoskeletal pain pattern Palpation: No palpable anomalies       Provocative Tests: Hyperextension/rotation test: (+) bilaterally for facet joint pain. Lumbar quadrant test (Kemp's test): (+) bilaterally for facet joint  pain.  Gait & Posture Assessment  Ambulation: Unassisted Gait: Relatively normal for age and body habitus Posture: Difficulty standing up straight, due to pain  Lower Extremity Exam    Side: Right lower extremity  Side: Left lower extremity  Stability: No instability observed          Stability: No instability observed          Skin & Extremity Inspection: Skin color, temperature, and hair growth are WNL. No peripheral edema or cyanosis. No masses, redness, swelling, asymmetry, or associated skin lesions. No contractures.  Skin & Extremity Inspection: Dystrophy left foot  Functional ROM: Unrestricted ROM                  Functional ROM: Unrestricted ROM                  Muscle Tone/Strength: Functionally intact. No obvious neuro-muscular anomalies detected.  Muscle Tone/Strength: Functionally intact. No  obvious neuro-muscular anomalies detected.  Sensory (Neurological): Unimpaired        Sensory (Neurological): Musculoskeletal pain pattern        DTR: Patellar: deferred today Achilles: deferred today Plantar: deferred today  DTR: Patellar: deferred today Achilles: deferred today Plantar: deferred today  Palpation: No palpable anomalies  Palpation: No palpable anomalies    Assessment  Primary Diagnosis & Pertinent Problem List: The primary encounter diagnosis was Lumbar facet arthropathy. Diagnoses of Lumbar degenerative disc disease, Lumbar spondylosis, History of left shoulder replacement, Chronic left shoulder pain, Cervical facet joint syndrome, Cervical radicular pain, and Chronic pain syndrome were also pertinent to this visit.  Visit Diagnosis (New problems to examiner): 1. Lumbar facet arthropathy   2. Lumbar degenerative disc disease   3. Lumbar spondylosis   4. History of left shoulder replacement   5. Chronic left shoulder pain   6. Cervical facet joint syndrome   7. Cervical radicular pain   8. Chronic pain syndrome    Plan of Care (Initial workup plan)  Note: Ms. Cothron was reminded that as per protocol, today's visit has been an evaluation only. We have not taken over the patient's controlled substance management.   Lab Orders         Compliance Drug Analysis, Ur     Standard protocol for new patients being considered for chronic opioid therapy to have a baseline urine toxicology screen which we will perform today. Discussed interventional options for chronic pain management.  Discussed diagnostic lumbar facet medial branch nerve blocks and possible RFA for low back pain secondary to lumbar facet arthropathy. Discussed left suprascapular nerve block and possible pulsed radiofrequency ablation versus suprascapular peripheral nerve stimulation for left shoulder pain. Also discussed diagnostic cervical facet medial branch nerve blocks for cervical spine pain related to  cervical degenerative disc disease and cervical facet arthropathy Follow-up in 3 to 4 weeks at which point I will review urine toxicology screen and if appropriate will take on patient for chronic opioid management.  We will also discuss the above mentioned interventions at that time to see if the patient would like to proceed with any of them. I encouraged her to continue with home exercises that she has been doing  Pharmacotherapy (current): Medications ordered:  No orders of the defined types were placed in this encounter.  Medications administered during this visit: Makaiyah L. Olexa had no medications administered during this visit.   Analgesic Pharmacotherapy:  Opioid Analgesics: For patients currently taking or requesting to take opioid analgesics, in accordance with [facility]  Medical Board Guidelines, we will assess their risks and indications for the use of these substances. After completing our evaluation, we may offer recommendations, but we no longer take patients for medication management. The prescribing physician will ultimately decide, based on his/her training and level of comfort whether to adopt any of the recommendations, including whether or not to prescribe such medicines.  Membrane stabilizer: Currently on gabapentin  Muscle relaxant:  Do not recommend for geriatric patients.  NSAID: To be determined at a later time  Other analgesic(s): To be determined at a later time   Interventional management options: Ms. Collymore was informed that there is no guarantee that she would be a candidate for interventional therapies. The decision will be based on the results of diagnostic studies, as well as Ms. Russum's risk profile.  Procedure(s) under consideration:  Diagnostic lumbar facet medial branch nerve block Left suprascapular nerve block, possible RFA Diagnostic cervical facet medial branch nerve blocks    Provider-requested follow-up: Return in about 1 month (around  12/29/2021) for 2nd pt visit (MM- tramadol).  I spent a total of 60 minutes reviewing chart data, face-to-face evaluation with the patient, counseling and coordination of care as detailed above.  Future Appointments  Date Time Provider [place]  12/29/2021 12:40 PM Gillis Santa, MD [facility] None    Note by: Gillis Santa, MD Date: 11/27/2021; Time: 1:31 PM

## 2021-12-02 ENCOUNTER — Emergency Department: Payer: Medicare Other

## 2021-12-02 ENCOUNTER — Emergency Department
Admission: EM | Admit: 2021-12-02 | Discharge: 2021-12-03 | Disposition: A | Discharge status: Home / Self Care | Payer: Medicare Other | Attending: Emergency Medicine | Admitting: Emergency Medicine

## 2021-12-02 ENCOUNTER — Other Ambulatory Visit: Payer: Self-pay

## 2021-12-02 DIAGNOSIS — Z794 Long term (current) use of insulin: Secondary | ICD-10-CM | POA: Insufficient documentation

## 2021-12-02 DIAGNOSIS — F0390 Unspecified dementia without behavioral disturbance: Secondary | ICD-10-CM | POA: Diagnosis not present

## 2021-12-02 DIAGNOSIS — M542 Cervicalgia: Secondary | ICD-10-CM | POA: Insufficient documentation

## 2021-12-02 DIAGNOSIS — M25512 Pain in left shoulder: Secondary | ICD-10-CM | POA: Insufficient documentation

## 2021-12-02 DIAGNOSIS — W19XXXA Unspecified fall, initial encounter: Secondary | ICD-10-CM | POA: Diagnosis not present

## 2021-12-02 DIAGNOSIS — E10649 Type 1 diabetes mellitus with hypoglycemia without coma: Secondary | ICD-10-CM | POA: Diagnosis present

## 2021-12-02 DIAGNOSIS — M546 Pain in thoracic spine: Secondary | ICD-10-CM | POA: Diagnosis not present

## 2021-12-02 DIAGNOSIS — E162 Hypoglycemia, unspecified: Secondary | ICD-10-CM

## 2021-12-02 DIAGNOSIS — M25511 Pain in right shoulder: Secondary | ICD-10-CM | POA: Insufficient documentation

## 2021-12-02 DIAGNOSIS — J45909 Unspecified asthma, uncomplicated: Secondary | ICD-10-CM | POA: Diagnosis not present

## 2021-12-02 LAB — COMPREHENSIVE METABOLIC PANEL
ALT: 32 U/L (ref 0–44)
AST: 27 U/L (ref 15–41)
Albumin: 4 g/dL (ref 3.5–5.0)
Alkaline Phosphatase: 165 U/L — ABNORMAL HIGH (ref 38–126)
Anion gap: 9 (ref 5–15)
BUN: 13 mg/dL (ref 8–23)
CO2: 24 mmol/L (ref 22–32)
Calcium: 9.1 mg/dL (ref 8.9–10.3)
Chloride: 102 mmol/L (ref 98–111)
Creatinine, Ser: 0.68 mg/dL (ref 0.44–1.00)
GFR, Estimated: >60 mL/min (ref >60)
Glucose, Bld: 162 mg/dL — ABNORMAL HIGH (ref 70–99)
Potassium: 4 mmol/L (ref 3.5–5.1)
Sodium: 135 mmol/L (ref 135–145)
Total Bilirubin: 0.8 mg/dL (ref 0.3–1.2)
Total Protein: 7.1 g/dL (ref 6.5–8.1)

## 2021-12-02 LAB — CBC WITH DIFFERENTIAL/PLATELET
Abs Immature Granulocytes: 0.05 10*3/uL (ref 0.00–0.07)
Basophils Absolute: 0 10*3/uL (ref 0.0–0.1)
Basophils Relative: 0 %
Eosinophils Absolute: 0.1 10*3/uL (ref 0.0–0.5)
Eosinophils Relative: 1 %
HCT: 40.8 % (ref 36.0–46.0)
Hemoglobin: 13.6 g/dL (ref 12.0–15.0)
Immature Granulocytes: 0 %
Lymphocytes Relative: 11 %
Lymphs Abs: 1.4 10*3/uL (ref 0.7–4.0)
MCH: 29.4 pg (ref 26.0–34.0)
MCHC: 33.3 g/dL (ref 30.0–36.0)
MCV: 88.3 fL (ref 80.0–100.0)
Monocytes Absolute: 0.7 10*3/uL (ref 0.1–1.0)
Monocytes Relative: 5 %
Neutro Abs: 10.4 10*3/uL — ABNORMAL HIGH (ref 1.7–7.7)
Neutrophils Relative %: 83 %
Platelets: 259 10*3/uL (ref 150–400)
RBC: 4.62 MIL/uL (ref 3.87–5.11)
RDW: 14.4 % (ref 11.5–15.5)
WBC: 12.6 10*3/uL — ABNORMAL HIGH (ref 4.0–10.5)
nRBC: 0 % (ref 0.0–0.2)

## 2021-12-02 LAB — CBG MONITORING, ED
Glucose-Capillary: 172 mg/dL — ABNORMAL HIGH (ref 70–99)
Glucose-Capillary: 193 mg/dL — ABNORMAL HIGH (ref 70–99)

## 2021-12-02 MED ORDER — ACETAMINOPHEN 500 MG PO TABS
1000.0000 mg | ORAL_TABLET | Freq: Once | ORAL | Status: AC
  Administered 2021-12-02: 1000 mg via ORAL
  Filled 2021-12-02: qty 2

## 2021-12-02 MED ORDER — ROPINIROLE HCL 1 MG PO TABS
1.0000 mg | ORAL_TABLET | Freq: Once | ORAL | Status: AC
Start: 2021-12-02 — End: 2021-12-02
  Administered 2021-12-02: 1 mg via ORAL
  Filled 2021-12-02: qty 1

## 2021-12-02 MED ORDER — LORAZEPAM 0.5 MG PO TABS
0.5000 mg | ORAL_TABLET | Freq: Once | ORAL | Status: AC
  Administered 2021-12-02: 0.5 mg via ORAL
  Filled 2021-12-02: qty 1

## 2021-12-02 MED ORDER — OXYCODONE-ACETAMINOPHEN 5-325 MG PO TABS: 1.0000 | ORAL_TABLET | Freq: Once | ORAL | Status: DC

## 2021-12-02 MED ORDER — IBUPROFEN 400 MG PO TABS
400.0000 mg | ORAL_TABLET | Freq: Once | ORAL | Status: AC
  Administered 2021-12-02: 400 mg via ORAL
  Filled 2021-12-02: qty 1

## 2021-12-02 NOTE — ED Notes (Signed)
Pt verbally reports that meds were ineffective, yet lying still, eyes closed, resp even and regular unless verbally spoken too.

## 2021-12-02 NOTE — ED Notes (Signed)
ED Provider at bedside. 

## 2021-12-02 NOTE — Discharge Instructions (Signed)
Please make sure you are having frequent meals.  You can take your home pain medication for pain.

## 2021-12-02 NOTE — ED Provider Notes (Incomplete)
[facility] Provider Note    Event Date/Time   First MD Initiated Contact with Patient 12/02/21 1931     (approximate)   History   Fall   HPI  Suzanne Snyder is a 71 y.o. female past medical history of type 1 diabetes with an insulin pump who presents after a fall.  Patient was hypoglycemic this morning when she woke up but she did not eat because she was very busy.  Her husband has Alzheimer's dementia at home health nurse come and she was preoccupied with this throughout the day.  She then had a fall and passed out.  Was found to be hypoglycemic with EMS.  She had cranberry juice and it improved to 170s.  Patient complains of pain on her head neck shoulders and upper back.  Denies chest or abdominal pain.  Denies vomiting.  She has an insulin pump and it has been giving her basal insulin throughout the day.      Past Medical History:  Diagnosis Date  . Anxiety   . Arthritis   . Asthma   . Diabetes mellitus without complication (HCC)   . Insulin pump in place   . Osteoporosis   . RLS (restless legs syndrome)   . Smokers' cough (HCC)   . Wears dentures    full upper and lower    Patient Active Problem List   Diagnosis Date Noted  . Lumbar spondylosis 11/27/2021  . Lumbar degenerative disc disease 11/27/2021  . 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/27/2021  . Chronic left shoulder pain 11/27/2021  . Cervical facet joint syndrome 11/27/2021  . Cervical radicular pain 11/27/2021  . Chronic pain syndrome 11/27/2021  . Cellulitis of right wrist 12/21/2020  . Cellulitis of right thigh 12/11/2020  . Type 1 diabetes mellitus with hypoglycemia without coma (HCC) 12/11/2020  . Hypoglycemia 12/10/2020     Physical Exam  Triage Vital Signs: ED Triage Vitals  Enc Vitals Group     BP 12/02/21 1936 138/74     Pulse Rate 12/02/21 1936 (!) 126     Resp 12/02/21 2043 (!) 22     Temp 12/02/21 1936 98 F (36.7 C)     Temp Source 12/02/21 1936 Oral      SpO2 12/02/21 1936 98 %     Weight 12/02/21 1935 112 lb 7 oz (51 kg)     Height 12/02/21 1935 5\' 8"  (1.727 m)     Head Circumference --      Peak Flow --      Pain Score 12/02/21 1935 5     Pain Loc --      Pain Edu? --      Excl. in GC? --     Most recent vital signs: Vitals:   12/02/21 2108 12/02/21 2148  BP: (!) 141/56 132/68  Pulse: 88 76  Resp: (!) 22 20  Temp:  98.2 F (36.8 C)  SpO2:  97%     General: Patient appears uncomfortable, restless CV:  Good peripheral perfusion.  Resp:  Normal effort.  Abd:  No distention.  Neuro:             Awake, Alert, Oriented x 3  Other:  Tenderness to palpation over the midline and paraspinal cervical musculature   ED Results / Procedures / Treatments  Labs (all labs ordered are listed, but only abnormal results are displayed) Labs Reviewed  COMPREHENSIVE METABOLIC PANEL - Abnormal; Notable for the following components:  Result Value   Glucose, Bld 162 (*)    Alkaline Phosphatase 165 (*)    All other components within normal limits  CBC WITH DIFFERENTIAL/PLATELET - Abnormal; Notable for the following components:   WBC 12.6 (*)    Neutro Abs 10.4 (*)    All other components within normal limits  CBG MONITORING, ED - Abnormal; Notable for the following components:   Glucose-Capillary 172 (*)    All other components within normal limits  CBG MONITORING, ED - Abnormal; Notable for the following components:   Glucose-Capillary 193 (*)    All other components within normal limits  URINALYSIS, ROUTINE W REFLEX MICROSCOPIC     EKG  EKG interpretation performed by myself: NSR, nml axis, nml intervals, no acute ischemic changes    RADIOLOGY I reviewed and interpreted the CT scan of the brain which does not show any acute intracranial process    PROCEDURES:  Critical Care performed: No  Procedures  The patient is on the cardiac monitor to evaluate for evidence of arrhythmia and/or significant heart rate  changes.   MEDICATIONS ORDERED IN ED: Medications  acetaminophen (TYLENOL) tablet 1,000 mg (1,000 mg Oral Given 12/02/21 2132)  ibuprofen (ADVIL) tablet 400 mg (400 mg Oral Given 12/02/21 2132)  rOPINIRole (REQUIP) tablet 1 mg (1 mg Oral Given 12/02/21 2145)  LORazepam (ATIVAN) tablet 0.5 mg (0.5 mg Oral Given 12/02/21 2135)     IMPRESSION / MDM / ASSESSMENT AND PLAN / ED COURSE  I reviewed the triage vital signs and the nursing notes.                              Patient's presentation is most consistent with acute presentation with potential threat to life or bodily function.  Differential diagnosis includes, but is not limited to, hypoglycemia secondary to insufficient caloric intake, infection, AKI, liver disease  The patient is a 71 year old type I diabetic on an insulin pump presents because of hypoglycemia.  She did not eat anything today as she was too busy.  She notes that her blood sugar was low when she woke up this morning but she did not eat.  She then fell in the bathroom and was found to have a blood sugar in the 30s.  Drink cranberry juice and has since improved to the 170s.  Patient is complaining of pain from the fall.  She complains of head pain neck pain and bilateral shoulder pain.  She does have some diffuse tenderness on the shoulders and C-spine without any deformity or outward signs of trauma.  Chest wall is nontender abdomen is soft.   Obtained CT head C-spine and chest x-ray and CT of the thoracic spine which do not have any obvious signs of trauma.  Patient did require 0.5 mg of Ativan because of her restless leg syndrome.  She was then able to sit still for CT.    FINAL CLINICAL IMPRESSION(S) / ED DIAGNOSES   Final diagnoses:  None     Rx / DC Orders   ED Discharge Orders     None        Note:  This document was prepared using Dragon voice recognition software and may include unintentional dictation errors.

## 2021-12-02 NOTE — ED Triage Notes (Signed)
In via EMS s/p fall with syncopal episode.  When pt awoke she was able to get herself back into bed.   Upon EMS/Fire dept arrival blood sugar was 31mg /dl, cranberry juice consumed and glucose rechecked and noted to be 171mg /dl.  Pt reports that glucose level has been low "a few days".  Pt c/o pain generalized.

## 2021-12-02 NOTE — ED Provider Notes (Incomplete)
[facility] Provider Note    Event Date/Time   First MD Initiated Contact with Patient 12/02/21 1931     (approximate)   History   Fall   HPI  Suzanne Snyder is a 71 y.o. female past medical history of type 1 diabetes with an insulin pump who presents after a fall.  Patient was hypoglycemic this morning when she woke up but she did not eat because she was very busy.  Her husband has Alzheimer's dementia at home health nurse come and she was preoccupied with this throughout the day.  She then had a fall and passed out.  Was found to be hypoglycemic with EMS.  She had cranberry juice and it improved to 170s.  Patient complains of pain on her head neck shoulders and upper back.  Denies chest or abdominal pain.  Denies vomiting.  She has an insulin pump and it has been giving her basal insulin throughout the day.      Past Medical History:  Diagnosis Date   Anxiety    Arthritis    Asthma    Diabetes mellitus without complication (HCC)    Insulin pump in place    Osteoporosis    RLS (restless legs syndrome)    Smokers' cough (HCC)    Wears dentures    full upper and lower    Patient Active Problem List   Diagnosis Date Noted   Lumbar spondylosis 11/27/2021   Lumbar degenerative disc disease 11/27/2021   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/27/2021   Chronic left shoulder pain 11/27/2021   Cervical facet joint syndrome 11/27/2021   Cervical radicular pain 11/27/2021   Chronic pain syndrome 11/27/2021   Cellulitis of right wrist 12/21/2020   Cellulitis of right thigh 12/11/2020   Type 1 diabetes mellitus with hypoglycemia without coma (HCC) 12/11/2020   Hypoglycemia 12/10/2020     Physical Exam  Triage Vital Signs: ED Triage Vitals  Enc Vitals Group     BP 12/02/21 1936 138/74     Pulse Rate 12/02/21 1936 (!) 126     Resp 12/02/21 2043 (!) 22     Temp 12/02/21 1936 98 F (36.7 C)     Temp Source 12/02/21 1936 Oral     SpO2 12/02/21  1936 98 %     Weight 12/02/21 1935 112 lb 7 oz (51 kg)     Height 12/02/21 1935 5\' 8"  (1.727 m)     Head Circumference --      Peak Flow --      Pain Score 12/02/21 1935 5     Pain Loc --      Pain Edu? --      Excl. in GC? --     Most recent vital signs: Vitals:   12/02/21 2257 12/02/21 2358  BP: (!) 111/53 (!) 123/59  Pulse: 80 68  Resp: 18 16  Temp: 98.3 F (36.8 C)   SpO2: 98% 97%     General: Patient appears uncomfortable, restless CV:  Good peripheral perfusion.  Resp:  Normal effort.  Abd:  No distention.  Neuro:             Awake, Alert, Oriented x 3  Other:  Tenderness to palpation over the midline and paraspinal cervical musculature   ED Results / Procedures / Treatments  Labs (all labs ordered are listed, but only abnormal results are displayed) Labs Reviewed  COMPREHENSIVE METABOLIC PANEL - Abnormal; Notable for the following components:  Result Value   Glucose, Bld 162 (*)    Alkaline Phosphatase 165 (*)    All other components within normal limits  CBC WITH DIFFERENTIAL/PLATELET - Abnormal; Notable for the following components:   WBC 12.6 (*)    Neutro Abs 10.4 (*)    All other components within normal limits  CBG MONITORING, ED - Abnormal; Notable for the following components:   Glucose-Capillary 172 (*)    All other components within normal limits  CBG MONITORING, ED - Abnormal; Notable for the following components:   Glucose-Capillary 193 (*)    All other components within normal limits  URINALYSIS, ROUTINE W REFLEX MICROSCOPIC     EKG  EKG interpretation performed by myself: NSR, nml axis, nml intervals, no acute ischemic changes    RADIOLOGY I reviewed and interpreted the CT scan of the brain which does not show any acute intracranial process    PROCEDURES:  Critical Care performed: No  Procedures  The patient is on the cardiac monitor to evaluate for evidence of arrhythmia and/or significant heart rate  changes.   MEDICATIONS ORDERED IN ED: Medications  oxyCODONE-acetaminophen (PERCOCET/ROXICET) 5-325 MG per tablet 1 tablet (has no administration in time range)  acetaminophen (TYLENOL) tablet 1,000 mg (1,000 mg Oral Given 12/02/21 2132)  ibuprofen (ADVIL) tablet 400 mg (400 mg Oral Given 12/02/21 2132)  rOPINIRole (REQUIP) tablet 1 mg (1 mg Oral Given 12/02/21 2145)  LORazepam (ATIVAN) tablet 0.5 mg (0.5 mg Oral Given 12/02/21 2135)     IMPRESSION / MDM / ASSESSMENT AND PLAN / ED COURSE  I reviewed the triage vital signs and the nursing notes.                              Patient's presentation is most consistent with acute presentation with potential threat to life or bodily function.  Differential diagnosis includes, but is not limited to, hypoglycemia secondary to insufficient caloric intake, infection, AKI, liver disease  The patient is a 71 year old type I diabetic on an insulin pump presents because of hypoglycemia.  She did not eat anything today as she was too busy.  She notes that her blood sugar was low when she woke up this morning but she did not eat.  She then fell in the bathroom and was found to have a blood sugar in the 30s.  Drink cranberry juice and has since improved to the 170s.  Patient is complaining of pain from the fall.  She complains of head pain neck pain and bilateral shoulder pain.  She does have some diffuse tenderness on the shoulders and C-spine without any deformity or outward signs of trauma.  Chest wall is nontender abdomen is soft.   Obtained CT head C-spine and chest x-ray and CT of the thoracic spine which do not have any obvious signs of trauma.  Patient did require 0.5 mg of Ativan because of her restless leg syndrome.  She was then able to sit still for CT.   Patient's blood sugar was stable in the ED.  Unfortunately she did not have a ride home overnight and were not able to get her a cab.  She tells me her insulin pump needs to be changed but she  does have a scare with her so she will be able to administer her insulin.  FINAL CLINICAL IMPRESSION(S) / ED DIAGNOSES   Final diagnoses:  Hypoglycemia     Rx / DC Orders  ED Discharge Orders     None        Note:  This document was prepared using Dragon voice recognition software and may include unintentional dictation errors.   Georga Hacking, MD 12/03/21 0017    Georga Hacking, MD 12/03/21 [phone]

## 2021-12-03 LAB — URINALYSIS, ROUTINE W REFLEX MICROSCOPIC
Bilirubin Urine: NEGATIVE
Glucose, UA: NEGATIVE mg/dL
Hgb urine dipstick: NEGATIVE
Ketones, ur: 5 mg/dL — AB
Leukocytes,Ua: NEGATIVE
Nitrite: NEGATIVE
Protein, ur: NEGATIVE mg/dL
Specific Gravity, Urine: 1.016 (ref 1.005–1.030)
pH: 5 (ref 5.0–8.0)

## 2021-12-03 LAB — COMPLIANCE DRUG ANALYSIS, UR

## 2021-12-03 NOTE — ED Notes (Signed)
Called daughter who stated she is on her way to get pt now.   DC instructions given verbally and in writing, understanding voiced.  Pad not working to obtain signature. Pt taken via wheelchair to lobby to await ride.

## 2021-12-29 ENCOUNTER — Ambulatory Visit
Payer: Medicare Other | Attending: Student in an Organized Health Care Education/Training Program | Admitting: Student in an Organized Health Care Education/Training Program

## 2021-12-29 ENCOUNTER — Encounter: Payer: Self-pay | Admitting: Student in an Organized Health Care Education/Training Program

## 2021-12-29 VITALS — BP 160/95 | HR 69 | Temp 97.2°F | Resp 16 | Ht 68.0 in | Wt 112.0 lb

## 2021-12-29 DIAGNOSIS — Z0289 Encounter for other administrative examinations: Secondary | ICD-10-CM | POA: Diagnosis present

## 2021-12-29 DIAGNOSIS — G894 Chronic pain syndrome: Secondary | ICD-10-CM | POA: Diagnosis present

## 2021-12-29 DIAGNOSIS — M47816 Spondylosis without myelopathy or radiculopathy, lumbar region: Secondary | ICD-10-CM | POA: Diagnosis not present

## 2021-12-29 DIAGNOSIS — M47812 Spondylosis without myelopathy or radiculopathy, cervical region: Secondary | ICD-10-CM | POA: Insufficient documentation

## 2021-12-29 DIAGNOSIS — Z96612 Presence of left artificial shoulder joint: Secondary | ICD-10-CM | POA: Insufficient documentation

## 2021-12-29 DIAGNOSIS — M25512 Pain in left shoulder: Secondary | ICD-10-CM | POA: Diagnosis not present

## 2021-12-29 DIAGNOSIS — M5136 Other intervertebral disc degeneration, lumbar region: Secondary | ICD-10-CM | POA: Diagnosis not present

## 2021-12-29 DIAGNOSIS — G8929 Other chronic pain: Secondary | ICD-10-CM | POA: Diagnosis present

## 2021-12-29 MED ORDER — TRAMADOL HCL 50 MG PO TABS: 50.0000 mg | ORAL_TABLET | Freq: Two times a day (BID) | ORAL | 2 refills | Status: DC | PRN

## 2021-12-29 NOTE — Progress Notes (Signed)
Safety precautions to be maintained throughout the outpatient stay will include: orient to surroundings, keep bed in low position, maintain call bell within reach at all times, provide assistance with transfer out of bed and ambulation.  

## 2021-12-29 NOTE — Assessment & Plan Note (Signed)
Discussed left suprascapular nerve block and possible pulsed radiofrequency ablation versus suprascapular peripheral nerve stimulation for left shoulder pain.

## 2021-12-29 NOTE — Patient Instructions (Signed)
Please have pt sign pain contract

## 2021-12-29 NOTE — Assessment & Plan Note (Signed)
Patient signed pain contract today and will take over tramadol for chronic pain management.

## 2021-12-29 NOTE — Progress Notes (Signed)
Medication agreement signed and copy given. Patient with understanding. 12/29/2021

## 2021-12-29 NOTE — Assessment & Plan Note (Signed)
discussed diagnostic cervical facet medial branch nerve blocks for cervical spine pain related to cervical degenerative disc disease and cervical facet arthropathy

## 2021-12-29 NOTE — Progress Notes (Addendum)
PROVIDER NOTE: Information contained herein reflects review and annotations entered in association with encounter. Interpretation of such information and data should be left to medically-trained personnel. Information provided to patient can be located elsewhere in the medical record under "Patient Instructions". Document created using STT-dictation technology, any transcriptional errors that may result from process are unintentional.    Patient: Suzanne Snyder  Service Category: E/M  Provider: Gillis Santa, MD  DOB: 1950-09-17  DOS: 12/29/2021  Referring Provider: Baxter Hire, MD  MRN: [id number]  Specialty: Interventional Pain Management  PCP: Baxter Hire, MD  Type: Established Patient  Setting: Ambulatory outpatient    Location: Office  Delivery: Face-to-face     HPI  [name], a 71 y.o. year old female, is here today because of her Lumbar facet arthropathy [M47.816]. [name] primary complain today is Hip Pain (Left s/p joint replacement ), Shoulder Pain (Left s/p joint replacement x 3 ), Back Pain (Upper to lower ), Neck Pain (Right side, pinched nerve ), and Foot Pain (Right s/p fx ) Last encounter: My last encounter with her was on 11/27/2021.  Pain Assessment: Severity of Chronic pain is reported as a 8 /10. Location: Back (see visit info for all pain sites.) Upper, Mid, Lower, Left, Right/denies. Onset: More than a month ago. Quality: Discomfort, Constant, Aching, Burning, Throbbing, Stabbing, Sharp, Numbness, Nagging. Timing: Constant. Modifying factor(s): nothing currently. Vitals:  height is _0  (1.727 m) and weight is 112 lb (50.8 kg). Her temporal temperature is 97.2 F (36.2 C) (abnormal). Her blood pressure is 160/95 (abnormal) and her pulse is 69. Her respiration is 16 and oxygen saturation is 99%.  BMI: Estimated body mass index is 17.03 kg/m as calculated from the following:   Height as of this encounter: _1  (1.727 m).   Weight as of this encounter:  112 lb (50.8 kg).  Reason for encounter: medication management. (2nd visit)  Patient presents for her 2nd visit. States that her husband (who is on hospice) is being discharged from the hospital today. She is somewhat apprehensive about this discharge because she is not physically able to take care of him. He has stage 3 Alzheimers.  We have discussed interventional therapies (detailed below in A&P). Patient to sign pain contract today. Placed referral for PT as well  See HPI from initial clinic visit below Suzanne Snyder is a very pleasant 71 year old female who presents with multiple pain generators including low back pain, left shoulder pain, cervical spine pain with radiation into her right arm. Patient is a type I diabetic on insulin. She states that when she was 71 years old, she fell off of a rope swing and fractured her lumbar spine. She went on to have spine surgery at the age of 71. 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 Of note she also has cervical spine pain related to cervical degenerative disc disease, cervical facet arthropathy as outlined in her cervical MRI below. She has done physical therapy in the past. She is on multimodal analgesics as below. She was previously being seen in [place] for pain management where she was prescribed tramadol which she takes 50 to 100 mg as needed for breakthrough pain. She is hoping to continue tramadol and to discuss any other options for chronic pain management.   Pharmacotherapy Assessment  Analgesic:Tramadol 50 to 100 mg daily as needed for breakthrough pain , first prescription today  Monitoring: [place] PMP: PDMP reviewed during this encounter.       Pharmacotherapy: No side-effects or adverse reactions reported. Compliance: No problems identified. Effectiveness: Clinically acceptable.  Ignatius Specking, RN  12/29/2021  1:51 PM   Signed Medication agreement signed and copy given. Patient with understanding. 12/29/2021  Janett Billow, RN  12/29/2021  1:44 PM  Signed Safety precautions to be maintained throughout the outpatient stay will include: orient to surroundings, keep bed in low position, maintain call bell within reach at all times, provide assistance with transfer out of bed and ambulation.     No results found for: "CBDTHCR" No results found for: "D8THCCBX" No results found for: "D9THCCBX"  UDS:  Summary  Date Value Ref Range Status  11/27/2021 Note  Final    Comment:    ==================================================================== Compliance Drug Analysis, Ur ==================================================================== Test                             Result       Flag       Units  Drug Present and Declared for Prescription Verification   7-aminoclonazepam              267          EXPECTED   ng/mg creat    7-aminoclonazepam is an expected metabolite of clonazepam. Source of    clonazepam is a scheduled prescription medication.    Gabapentin                     PRESENT      EXPECTED   Cyclobenzaprine                PRESENT      EXPECTED   Desmethylcyclobenzaprine       PRESENT      EXPECTED    Desmethylcyclobenzaprine is an expected metabolite of    cyclobenzaprine.  Drug Present not Declared for Prescription Verification   Tramadol                       6697         UNEXPECTED ng/mg creat   O-Desmethyltramadol            3727         UNEXPECTED ng/mg creat   N-Desmethyltramadol            1160         UNEXPECTED ng/mg creat    Source of tramadol is a prescription medication. O-desmethyltramadol    and N-desmethyltramadol are expected metabolites of tramadol.    Acetaminophen                  PRESENT      UNEXPECTED  Drug Absent but Declared for Prescription Verification   Doxepin                        Not Detected UNEXPECTED   Salicylate                     Not  Detected UNEXPECTED    Aspirin, as indicated in the declared medication list, is not always    detected even when used as directed.    Diphenhydramine                Not Detected UNEXPECTED   Hydroxyzine  Not Detected UNEXPECTED ==================================================================== Test                      Result    Flag   Units      Ref Range   Creatinine              30               mg/dL      >=20 ==================================================================== Declared Medications:  The flagging and interpretation on this report are based on the  following declared medications.  Unexpected results may arise from  inaccuracies in the declared medications.   **Note: The testing scope of this panel includes these medications:   Clonazepam (Klonopin)  Cyclobenzaprine (Flexeril)  Diphenhydramine  Doxepin (Sinequan)  Gabapentin (Neurontin)  Hydroxyzine (Atarax)   **Note: The testing scope of this panel does not include small to  moderate amounts of these reported medications:   Aspirin   **Note: The testing scope of this panel does not include the  following reported medications:   Albuterol (Ventolin HFA)  Ammonium Hydroxide (Ammonium Lactate)  Celecoxib (Celebrex)  Clobetasol (Temovate)  Fish Oil  Insulin (NovoLog)  Ipratropium (Atrovent)  Lactic Acid (Ammonium Lactate)  Mometasone  Multivitamin  Ropinirole (Requip)  Triamcinolone (Kenalog)  Vitamin D2 (Drisdol) ==================================================================== For clinical consultation, please call [phone]. ====================================================================       ROS  Constitutional: Denies any fever or chills Gastrointestinal: No reported hemesis, hematochezia, vomiting, or acute GI distress Musculoskeletal:  low back pain, bilateral hip pain, shoulder pain Neurological: No reported episodes of acute onset apraxia, aphasia,  dysarthria, agnosia, amnesia, paralysis, loss of coordination, or loss of consciousness  Medication Review  Fish Oil, Multiple Vitamin, Vitamin D (Ergocalciferol), acyclovir, albuterol, ammonium lactate, aspirin EC, celecoxib, clobetasol cream, clonazePAM, cyclobenzaprine, diphenhydrAMINE HCl, gabapentin, hydrOXYzine, insulin aspart, ipratropium, rOPINIRole, traMADol, and triamcinolone cream  History Review  Allergy: Ms. Guynes is allergic to pregabalin and erythromycin. Drug: Ms. Vinal  reports no history of drug use. Alcohol:  reports no history of alcohol use. Tobacco:  reports that she has been smoking cigarettes. She has a 50.00 pack-year smoking history. She has never used smokeless tobacco. Social: Ms. Billard  reports that she has been smoking cigarettes. She has a 50.00 pack-year smoking history. She has never used smokeless tobacco. She reports that she does not drink alcohol and does not use drugs. Medical:  has a past medical history of Anxiety, Arthritis, Asthma, Diabetes mellitus without complication ([place]), Insulin pump in place, Osteoporosis, RLS (restless legs syndrome), Smokers' cough ([place]), and Wears dentures. Surgical: Ms. Colclasure  has a past surgical history that includes Joint replacement (Left, 2007); Spine surgery (N/A, 1984); Rotator cuff repair (Left, 2014 and 2015); Thyroid surgery; Cataract extraction w/PHACO (Right, 02/20/2020); and Cataract extraction w/PHACO (Left, 03/05/2020). Family: family history includes Parkinsonism in her father.  Laboratory Chemistry Profile   Renal Lab Results  Component Value Date   BUN 13 12/02/2021   CREATININE 0.68 12/02/2021   GFRAA >60 01/19/2018   GFRNONAA >60 12/02/2021    Hepatic Lab Results  Component Value Date   AST 27 12/02/2021   ALT 32 12/02/2021   ALBUMIN 4.0 12/02/2021   ALKPHOS 165 (H) 12/02/2021    Electrolytes Lab Results  Component Value Date   NA 135 12/02/2021   K 4.0 12/02/2021   CL 102 12/02/2021   CALCIUM  9.1 12/02/2021   MG 2.0 12/23/2020    Bone No results found for: "VD25OH", "  QJ194RD4YCX", "KG8185UD1", "SH7026VZ8", "58IFOYDX4", "25OHVITD2", "12INOMVE7", "TESTOFREE", "TESTOSTERONE"  Inflammation (CRP: Acute Phase) (ESR: Chronic Phase) Lab Results  Component Value Date   ESRSEDRATE 42 (H) 12/21/2020   LATICACIDVEN 1.2 12/21/2020         Note: Above Lab results reviewed.  Recent Imaging Review  DG Shoulder Right CLINICAL DATA:  Fall, pain  EXAM: RIGHT SHOULDER - 2+ VIEW  COMPARISON:  None Available.  FINDINGS: No acute bony abnormality. Specifically, no fracture, subluxation, or dislocation. Soft tissues are intact.  IMPRESSION: No acute bony abnormality.  Electronically Signed   By: Rolm Baptise M.D.   On: 12/03/2021 00:13 DG Shoulder Left CLINICAL DATA:  Fall, pain  EXAM: LEFT SHOULDER - 2+ VIEW  COMPARISON:  None Available.  FINDINGS: Prior left shoulder replacement. No hardware complicating feature. No acute fracture, subluxation or dislocation. Degenerative changes in the [facility] joint.  IMPRESSION: Prior left shoulder replacement.  No acute bony abnormality.  Electronically Signed   By: Rolm Baptise M.D.   On: 12/03/2021 00:12 Note: Reviewed        Physical Exam  General appearance: Well nourished, well developed, and well hydrated. In no apparent acute distress Mental status: Alert, oriented x 3 (person, place, & time)       Respiratory: No evidence of acute respiratory distress Eyes: PERLA Vitals: BP (!) 160/95 (BP Location: Left Arm, Patient Position: Sitting, Cuff Size: Normal)   Pulse 69   Temp (!) 97.2 F (36.2 C) (Temporal)   Resp 16   Ht _0  (1.727 m)   Wt 112 lb (50.8 kg)   SpO2 99%   BMI 17.03 kg/m  BMI: Estimated body mass index is 17.03 kg/m as calculated from the following:   Height as of this encounter: _1  (1.727 m).   Weight as of this encounter: 112 lb (50.8 kg). Ideal: Ideal body weight: 63.9 kg (140 lb 14  oz)   Cervical Spine Area Exam  Skin & Axial Inspection: No masses, redness, edema, swelling, or associated skin lesions Alignment: Symmetrical Functional ROM: Pain restricted ROM, to the left Stability: No instability detected Muscle Tone/Strength: Functionally intact. No obvious neuro-muscular anomalies detected. Sensory (Neurological): Arthropathic arthralgia and referred left shoulder pain Palpation: Complains of area being tender to palpation             Upper Extremity (UE) Exam      Side: Right upper extremity   Side: Left upper extremity  Skin & Extremity Inspection: Skin color, temperature, and hair growth are WNL. No peripheral edema or cyanosis. No masses, redness, swelling, asymmetry, or associated skin lesions. No contractures.   Skin & Extremity Inspection: Evidence of prior arthroplastic surgery  Functional ROM: Unrestricted ROM           Functional ROM: Pain restricted ROM for shoulder  Muscle Tone/Strength: Functionally intact. No obvious neuro-muscular anomalies detected.   Muscle Tone/Strength: Functionally intact. No obvious neuro-muscular anomalies detected.  Sensory (Neurological): Unimpaired           Sensory (Neurological): Arthropathic arthralgia          Palpation: No palpable anomalies               Palpation: No palpable anomalies              Provocative Test(s):  Phalen's test: deferred Tinel's test: deferred Apley's scratch test (touch opposite shoulder):  Action 1 (Across chest): deferred Action 2 (Overhead): deferred Action 3 (LB reach): deferred     Provocative Test(s):  Phalen's test: deferred Tinel's test: deferred Apley's scratch test (touch opposite shoulder):  Action 1 (Across chest): Decreased ROM Action 2 (Overhead): Decreased ROM Action 3 (LB reach): Decreased ROM      Lumbar Spine Area Exam  Skin & Axial Inspection: Well healed scar from previous spine surgery detected Alignment: Asymmetric Functional ROM: Pain restricted ROM        Stability: No instability detected Muscle Tone/Strength: Functionally intact. No obvious neuro-muscular anomalies detected. Sensory (Neurological): Musculoskeletal pain pattern Palpation: No palpable anomalies       Provocative Tests: Hyperextension/rotation test: (+) bilaterally for facet joint pain. Lumbar quadrant test (Kemp's test): (+) bilaterally for facet joint pain.   Gait & Posture Assessment  Ambulation: Unassisted Gait: Relatively normal for age and body habitus Posture: Difficulty standing up straight, due to pain  Lower Extremity Exam      Side: Right lower extremity   Side: Left lower extremity  Stability: No instability observed           Stability: No instability observed          Skin & Extremity Inspection: Skin color, temperature, and hair growth are WNL. No peripheral edema or cyanosis. No masses, redness, swelling, asymmetry, or associated skin lesions. No contractures.   Skin & Extremity Inspection: Dystrophy left foot  Functional ROM: Unrestricted ROM                   Functional ROM: Unrestricted ROM                  Muscle Tone/Strength: Functionally intact. No obvious neuro-muscular anomalies detected.   Muscle Tone/Strength: Functionally intact. No obvious neuro-muscular anomalies detected.  Sensory (Neurological): Unimpaired         Sensory (Neurological): Musculoskeletal pain pattern        DTR: Patellar: deferred today Achilles: deferred today Plantar: deferred today   DTR: Patellar: deferred today Achilles: deferred today Plantar: deferred today  Palpation: No palpable anomalies   Palpation: No palpable anomalies     Assessment   Diagnosis Status  1. Lumbar facet arthropathy   2. Lumbar degenerative disc disease   3. Lumbar spondylosis   4. History of left shoulder replacement   5. Chronic left shoulder pain   6. Cervical facet joint syndrome   7. Pain management contract signed   8. Chronic pain syndrome    Persistent Persistent Persistent    Updated Problems: Problem  Lumbar Degenerative Disc Disease  Chronic Left Shoulder Pain  Cervical Facet Joint Syndrome  Chronic Pain Syndrome    Plan of Care  Problem-specific:  Lumbar degenerative disc disease Discussed diagnostic lumbar facet medial branch nerve blocks and possible RFA for low back pain secondary to lumbar facet arthropathy & lumbar DDD.   Chronic left shoulder pain Discussed left suprascapular nerve block and possible pulsed radiofrequency ablation versus suprascapular peripheral nerve stimulation for left shoulder pain.   Cervical facet joint syndrome  discussed diagnostic cervical facet medial branch nerve blocks for cervical spine pain related to cervical degenerative disc disease and cervical facet arthropathy   Chronic pain syndrome Patient signed pain contract today and will take over tramadol for chronic pain management.  Ms. JAUNITA MIKELS has a current medication list which includes the following long-term medication(s): albuterol, clonazepam, diphenhydramine hcl, gabapentin, insulin aspart, ipratropium, and ropinirole.  Pharmacotherapy (Medications Ordered): Meds ordered this encounter  Medications   traMADol (ULTRAM) 50 MG tablet    Sig: Take 1 tablet (50 mg total) by  mouth every 12 (twelve) hours as needed for severe pain. Must last 30 days    Dispense:  60 tablet    Refill:  2    Chronic Pain: STOP Act (Not applicable) Fill 1 day early if closed on refill date. Avoid benzodiazepines within 8 hours of opioids   Orders:  Orders Placed This Encounter  Procedures   Ambulatory referral to Physical Therapy    Referral Priority:   Routine    Referral Type:   Physical Medicine    Referral Reason:   Specialty Services Required    Requested Specialty:   Physical Therapy    Number of Visits Requested:   1   Follow-up plan:   Return in about 3 months (around 03/31/2022) for Medication Management, in person.    Recent Visits Date Type Provider  Dept  11/27/21 Office Visit Gillis Santa, MD [facility]  Showing recent visits within past 90 days and meeting all other requirements Today's Visits Date Type Provider Dept  12/29/21 Office Visit Gillis Santa, MD [facility]  Showing today's visits and meeting all other requirements Future Appointments No visits were found meeting these conditions. Showing future appointments within next 90 days and meeting all other requirements  I discussed the assessment and treatment plan with the patient. The patient was provided an opportunity to ask questions and all were answered. The patient agreed with the plan and demonstrated an understanding of the instructions.  Patient advised to call back or seek an in-person evaluation if the symptoms or condition worsens.  Duration of encounter: 6mnutes.  Total time on encounter, as per AMA guidelines included both the face-to-face and non-face-to-face time personally spent by the physician and/or other qualified health care professional(s) on the day of the encounter (includes time in activities that require the physician or other qualified health care professional and does not include time in activities normally performed by clinical staff). Physician's time may include the following activities when performed: preparing to see the patient (eg, review of tests, pre-charting review of records) obtaining and/or reviewing separately obtained history performing a medically appropriate examination and/or evaluation counseling and educating the patient/family/caregiver ordering medications, tests, or procedures referring and communicating with other health care professionals (when not separately reported) documenting clinical information in the electronic or other health record independently interpreting results (not separately reported) and communicating results to the patient/ family/caregiver care coordination (not separately  reported)  Note by: BGillis Santa MD Date: 12/29/2021; Time: 1:59 PM

## 2021-12-29 NOTE — Addendum Note (Signed)
Addended by: Edward Jolly on: 12/29/2021 01:59 PM   Modules accepted: Orders

## 2021-12-29 NOTE — Assessment & Plan Note (Signed)
Discussed diagnostic lumbar facet medial branch nerve blocks and possible RFA for low back pain secondary to lumbar facet arthropathy & lumbar DDD.

## 2022-01-07 ENCOUNTER — Emergency Department
Admission: EM | Admit: 2022-01-07 | Discharge: 2022-01-07 | Disposition: A | Discharge status: Home / Self Care | Payer: Medicare Other | Attending: Student in an Organized Health Care Education/Training Program | Admitting: Student in an Organized Health Care Education/Training Program

## 2022-01-07 ENCOUNTER — Emergency Department: Payer: Medicare Other

## 2022-01-07 DIAGNOSIS — X58XXXA Exposure to other specified factors, initial encounter: Secondary | ICD-10-CM | POA: Insufficient documentation

## 2022-01-07 DIAGNOSIS — Y9389 Activity, other specified: Secondary | ICD-10-CM | POA: Insufficient documentation

## 2022-01-07 DIAGNOSIS — Y998 Other external cause status: Secondary | ICD-10-CM | POA: Diagnosis not present

## 2022-01-07 DIAGNOSIS — S90851A Superficial foreign body, right foot, initial encounter: Secondary | ICD-10-CM | POA: Insufficient documentation

## 2022-01-07 DIAGNOSIS — Y9289 Other specified places as the place of occurrence of the external cause: Secondary | ICD-10-CM | POA: Insufficient documentation

## 2022-01-07 DIAGNOSIS — S99929A Unspecified injury of unspecified foot, initial encounter: Secondary | ICD-10-CM | POA: Diagnosis present

## 2022-01-07 DIAGNOSIS — E119 Type 2 diabetes mellitus without complications: Secondary | ICD-10-CM | POA: Diagnosis not present

## 2022-01-07 MED ORDER — PROBIOTIC 250 MG PO CAPS: 1.0000 | ORAL_CAPSULE | Freq: Two times a day (BID) | ORAL | 0 refills | Status: DC | PRN

## 2022-01-07 MED ORDER — AMOXICILLIN-POT CLAVULANATE 875-125 MG PO TABS
1.0000 | ORAL_TABLET | Freq: Once | ORAL | Status: AC
  Administered 2022-01-07: 1 via ORAL
  Filled 2022-01-07: qty 1

## 2022-01-07 MED ORDER — LIDOCAINE 4 % EX CREA
TOPICAL_CREAM | Freq: Once | CUTANEOUS | Status: AC
  Administered 2022-01-07: 1 via TOPICAL
  Filled 2022-01-07: qty 5

## 2022-01-07 MED ORDER — BACITRACIN ZINC 500 UNIT/GM EX OINT
TOPICAL_OINTMENT | Freq: Once | CUTANEOUS | Status: AC
  Administered 2022-01-07: 1 via TOPICAL
  Filled 2022-01-07: qty 0.9

## 2022-01-07 MED ORDER — AMOXICILLIN-POT CLAVULANATE 500-125 MG PO TABS: 1.0000 | ORAL_TABLET | Freq: Two times a day (BID) | ORAL | 0 refills | Status: AC

## 2022-01-07 MED ORDER — LIDOCAINE HCL (PF) 1 % IJ SOLN
5.0000 mL | Freq: Once | INTRAMUSCULAR | Status: AC
  Administered 2022-01-07: 5 mL via INTRADERMAL
  Filled 2022-01-07: qty 5

## 2022-01-07 NOTE — ED Triage Notes (Signed)
Pt here with possible glass in her right foot. Pt stable in triage.

## 2022-01-07 NOTE — ED Triage Notes (Signed)
First Nurse Note:  Arrives from [facility] for possible piece of glass to bottom of foot x 4 days.

## 2022-01-07 NOTE — ED Notes (Signed)
Patient Alert and oriented to baseline. Stable and ambulatory to baseline. Patient verbalized understanding of the discharge instructions.  Patient belongings were taken by the patient.   

## 2022-01-07 NOTE — ED Provider Notes (Signed)
[facility] Provider Note    Event Date/Time   First MD Initiated Contact with Patient 01/07/22 1608     (approximate)   History   Foot Injury   HPI  Suzanne Snyder is a 71 y.o. female diabetic with history of peripheral neuropathy presents to the ER for evaluation of foreign body in her right heel.  Has been present for about 2 to 3 days.  Believes it was a piece of glass.  Has been having worsening pain and some swelling no purulence or drainage.  No fever.     Physical Exam   Triage Vital Signs: ED Triage Vitals  Enc Vitals Group     BP 01/07/22 1253 137/69     Pulse Rate 01/07/22 1253 68     Resp 01/07/22 1253 18     Temp 01/07/22 1346 98.5 F (36.9 C)     Temp Source 01/07/22 1346 Oral     SpO2 01/07/22 1253 99 %     Weight --      Height --      Head Circumference --      Peak Flow --      Pain Score 01/07/22 1346 9     Pain Loc --      Pain Edu? --      Excl. in [place]? --     Most recent vital signs: Vitals:   01/07/22 1346 01/07/22 1819  BP:  129/81  Pulse:  72  Resp:  15  Temp: 98.5 F (36.9 C) 98.2 F (36.8 C)  SpO2:  99%     Constitutional: Alert  Eyes: Conjunctivae are normal.  Head: Atraumatic. Nose: No congestion/rhinnorhea. Mouth/Throat: Mucous membranes are moist.   Neck: Painless ROM.  Cardiovascular:   Good peripheral circulation. Respiratory: Normal respiratory effort.  No retractions.  Gastrointestinal: Soft and nontender.  Musculoskeletal:  no deformity Neurologic:  MAE spontaneously. No gross focal neurologic deficits are appreciated.  Skin:  Skin is warm, dry and intact. No rash noted.  Able to feel small foreign body in the base of the heel no surrounding erythema or abscess formation. Psychiatric: Mood and affect are normal. Speech and behavior are normal.    ED Results / Procedures / Treatments   Labs (all labs ordered are listed, but only abnormal results are displayed) Labs Reviewed - No data  to display   EKG     RADIOLOGY Please see ED Course for my review and interpretation.  I personally reviewed all radiographic images ordered to evaluate for the above acute complaints and reviewed radiology reports and findings.  These findings were personally discussed with the patient.  Please see medical record for radiology report.    PROCEDURES:  Critical Care performed: No  .Foreign Body Removal  Date/Time: 01/07/2022 5:42 PM  Performed by: Willy Eddy, MD Authorized by: Willy Eddy, MD  Body area: skin General location: lower extremity Location details: right foot Anesthesia: local infiltration  Anesthesia: Local Anesthetic: lidocaine 1% without epinephrine Anesthetic total: 2 mL     MEDICATIONS ORDERED IN ED: Medications  lidocaine (LMX) 4 % cream (1 Application Topical Given by Other 01/07/22 1719)  lidocaine (PF) (XYLOCAINE) 1 % injection 5 mL (5 mLs Intradermal Given by Other 01/07/22 1718)  amoxicillin-clavulanate (AUGMENTIN) 875-125 MG per tablet 1 tablet (1 tablet Oral Given 01/07/22 1807)  bacitracin ointment (1 Application Topical Given by Other 01/07/22 1805)     IMPRESSION / MDM / ASSESSMENT AND PLAN / ED  COURSE  I reviewed the triage vital signs and the nursing notes.                              Differential diagnosis includes, but is not limited to, foreign body, fracture, abscess, cellulitis, osteo-  Patient presented to ER for evaluation of symptoms as described above.  Does have palpable foreign body and base of her heel.  My review and interpretation does not show any evidence of fracture but did identify small radiopaque foreign body.  Will attempt removal.   Clinical Course as of 01/07/22 1844  Wed Jan 07, 2022  1746 Able to remove 1 piece of glass.  With probing no other foreign bodies identified.  Patient does appear stable appropriate for outpatient follow-up.  Given her history of neuropathy and as I was probing the  area there was a scant amount of purulent drainage will place on antibiotics.  Patient agreeable to plan. [PR]    Clinical Course User Index [PR] Willy Eddy, MD     FINAL CLINICAL IMPRESSION(S) / ED DIAGNOSES   Final diagnoses:  Foreign body in foot, right, initial encounter     Rx / DC Orders   ED Discharge Orders          Ordered    amoxicillin-clavulanate (AUGMENTIN) 500-125 MG tablet  2 times daily        01/07/22 1742    Saccharomyces boulardii (PROBIOTIC) 250 MG CAPS  2 times daily between meals and at bedtime PRN        01/07/22 1742             Note:  This document was prepared using Dragon voice recognition software and may include unintentional dictation errors.    Willy Eddy, MD 01/07/22 [phone]

## 2022-02-08 ENCOUNTER — Inpatient Hospital Stay
Admission: EM | Admit: 2022-02-08 | Discharge: 2022-02-11 | DRG: 481 | Disposition: A | Discharge status: Home Health | Payer: Medicare Other | Attending: Obstetrics and Gynecology | Admitting: Obstetrics and Gynecology

## 2022-02-08 ENCOUNTER — Emergency Department: Payer: Medicare Other

## 2022-02-08 ENCOUNTER — Other Ambulatory Visit: Payer: Self-pay

## 2022-02-08 DIAGNOSIS — F17210 Nicotine dependence, cigarettes, uncomplicated: Secondary | ICD-10-CM | POA: Diagnosis present

## 2022-02-08 DIAGNOSIS — Z681 Body mass index (BMI) 19 or less, adult: Secondary | ICD-10-CM

## 2022-02-08 DIAGNOSIS — S72001A Fracture of unspecified part of neck of right femur, initial encounter for closed fracture: Secondary | ICD-10-CM | POA: Diagnosis present

## 2022-02-08 DIAGNOSIS — E1142 Type 2 diabetes mellitus with diabetic polyneuropathy: Secondary | ICD-10-CM

## 2022-02-08 DIAGNOSIS — M21961 Unspecified acquired deformity of right lower leg: Secondary | ICD-10-CM | POA: Diagnosis present

## 2022-02-08 DIAGNOSIS — Z881 Allergy status to other antibiotic agents status: Secondary | ICD-10-CM

## 2022-02-08 DIAGNOSIS — Z66 Do not resuscitate: Secondary | ICD-10-CM | POA: Diagnosis present

## 2022-02-08 DIAGNOSIS — E860 Dehydration: Secondary | ICD-10-CM | POA: Diagnosis present

## 2022-02-08 DIAGNOSIS — E10649 Type 1 diabetes mellitus with hypoglycemia without coma: Secondary | ICD-10-CM | POA: Diagnosis not present

## 2022-02-08 DIAGNOSIS — Z716 Tobacco abuse counseling: Secondary | ICD-10-CM

## 2022-02-08 DIAGNOSIS — W19XXXA Unspecified fall, initial encounter: Secondary | ICD-10-CM | POA: Diagnosis present

## 2022-02-08 DIAGNOSIS — E440 Moderate protein-calorie malnutrition: Secondary | ICD-10-CM | POA: Diagnosis present

## 2022-02-08 DIAGNOSIS — G894 Chronic pain syndrome: Secondary | ICD-10-CM | POA: Diagnosis present

## 2022-02-08 DIAGNOSIS — E872 Acidosis, unspecified: Secondary | ICD-10-CM | POA: Diagnosis present

## 2022-02-08 DIAGNOSIS — Z794 Long term (current) use of insulin: Secondary | ICD-10-CM | POA: Diagnosis not present

## 2022-02-08 DIAGNOSIS — L03115 Cellulitis of right lower limb: Secondary | ICD-10-CM | POA: Diagnosis present

## 2022-02-08 DIAGNOSIS — E1043 Type 1 diabetes mellitus with diabetic autonomic (poly)neuropathy: Secondary | ICD-10-CM | POA: Diagnosis present

## 2022-02-08 DIAGNOSIS — S72011A Unspecified intracapsular fracture of right femur, initial encounter for closed fracture: Secondary | ICD-10-CM | POA: Diagnosis present

## 2022-02-08 DIAGNOSIS — Z888 Allergy status to other drugs, medicaments and biological substances status: Secondary | ICD-10-CM

## 2022-02-08 DIAGNOSIS — M810 Age-related osteoporosis without current pathological fracture: Secondary | ICD-10-CM | POA: Diagnosis present

## 2022-02-08 DIAGNOSIS — G629 Polyneuropathy, unspecified: Secondary | ICD-10-CM | POA: Insufficient documentation

## 2022-02-08 DIAGNOSIS — Z7982 Long term (current) use of aspirin: Secondary | ICD-10-CM

## 2022-02-08 DIAGNOSIS — Z820 Family history of epilepsy and other diseases of the nervous system: Secondary | ICD-10-CM

## 2022-02-08 DIAGNOSIS — M109 Gout, unspecified: Secondary | ICD-10-CM | POA: Diagnosis present

## 2022-02-08 DIAGNOSIS — J45909 Unspecified asthma, uncomplicated: Secondary | ICD-10-CM | POA: Diagnosis present

## 2022-02-08 DIAGNOSIS — Z79899 Other long term (current) drug therapy: Secondary | ICD-10-CM | POA: Diagnosis not present

## 2022-02-08 DIAGNOSIS — E871 Hypo-osmolality and hyponatremia: Secondary | ICD-10-CM | POA: Diagnosis present

## 2022-02-08 DIAGNOSIS — M25551 Pain in right hip: Principal | ICD-10-CM

## 2022-02-08 DIAGNOSIS — Z791 Long term (current) use of non-steroidal anti-inflammatories (NSAID): Secondary | ICD-10-CM

## 2022-02-08 DIAGNOSIS — G2581 Restless legs syndrome: Secondary | ICD-10-CM | POA: Diagnosis present

## 2022-02-08 DIAGNOSIS — Z720 Tobacco use: Secondary | ICD-10-CM | POA: Insufficient documentation

## 2022-02-08 DIAGNOSIS — Z91048 Other nonmedicinal substance allergy status: Secondary | ICD-10-CM

## 2022-02-08 LAB — COMPREHENSIVE METABOLIC PANEL
ALT: 14 U/L (ref 0–44)
AST: 19 U/L (ref 15–41)
Albumin: 3.2 g/dL — ABNORMAL LOW (ref 3.5–5.0)
Alkaline Phosphatase: 115 U/L (ref 38–126)
Anion gap: 7 (ref 5–15)
BUN: 14 mg/dL (ref 8–23)
CO2: 23 mmol/L (ref 22–32)
Calcium: 8.4 mg/dL — ABNORMAL LOW (ref 8.9–10.3)
Chloride: 104 mmol/L (ref 98–111)
Creatinine, Ser: 0.7 mg/dL (ref 0.44–1.00)
GFR, Estimated: >60 mL/min (ref >60)
Glucose, Bld: 154 mg/dL — ABNORMAL HIGH (ref 70–99)
Potassium: 4.1 mmol/L (ref 3.5–5.1)
Sodium: 134 mmol/L — ABNORMAL LOW (ref 135–145)
Total Bilirubin: 0.3 mg/dL (ref 0.3–1.2)
Total Protein: 6.4 g/dL — ABNORMAL LOW (ref 6.5–8.1)

## 2022-02-08 LAB — CBC
HCT: 37.5 % (ref 36.0–46.0)
Hemoglobin: 12.2 g/dL (ref 12.0–15.0)
MCH: 30.2 pg (ref 26.0–34.0)
MCHC: 32.5 g/dL (ref 30.0–36.0)
MCV: 92.8 fL (ref 80.0–100.0)
Platelets: 219 10*3/uL (ref 150–400)
RBC: 4.04 MIL/uL (ref 3.87–5.11)
RDW: 14.7 % (ref 11.5–15.5)
WBC: 9.1 10*3/uL (ref 4.0–10.5)
nRBC: 0 % (ref 0.0–0.2)

## 2022-02-08 LAB — PROTIME-INR
INR: 1.2 (ref 0.8–1.2)
Prothrombin Time: 14.7 seconds (ref 11.4–15.2)

## 2022-02-08 LAB — TYPE AND SCREEN
ABO/RH(D): O POS
Antibody Screen: NEGATIVE

## 2022-02-08 LAB — GLUCOSE, CAPILLARY: Glucose-Capillary: 219 mg/dL — ABNORMAL HIGH (ref 70–99)

## 2022-02-08 LAB — LACTIC ACID, PLASMA: Lactic Acid, Venous: 1 mmol/L (ref 0.5–1.9)

## 2022-02-08 LAB — MAGNESIUM: Magnesium: 1.6 mg/dL — ABNORMAL LOW (ref 1.7–2.4)

## 2022-02-08 MED ORDER — SODIUM CHLORIDE 0.9 % IV SOLN
1.0000 g | Freq: Once | INTRAVENOUS | Status: AC
  Administered 2022-02-08: 1 g via INTRAVENOUS
  Filled 2022-02-08: qty 10

## 2022-02-08 MED ORDER — MAGNESIUM HYDROXIDE 400 MG/5ML PO SUSP
30.0000 mL | Freq: Every day | ORAL | Status: DC | PRN
  Filled 2022-02-08 (×2): qty 30

## 2022-02-08 MED ORDER — TRAZODONE HCL 50 MG PO TABS
25.0000 mg | ORAL_TABLET | Freq: Every evening | ORAL | Status: DC | PRN
  Administered 2022-02-08: 25 mg via ORAL
  Filled 2022-02-08: qty 1

## 2022-02-08 MED ORDER — ONDANSETRON HCL 4 MG/2ML IJ SOLN: 4.0000 mg | INTRAMUSCULAR | Status: DC | PRN

## 2022-02-08 MED ORDER — MORPHINE SULFATE (PF) 4 MG/ML IV SOLN
4.0000 mg | Freq: Once | INTRAVENOUS | Status: AC
  Administered 2022-02-08: 4 mg via INTRAVENOUS
  Filled 2022-02-08: qty 1

## 2022-02-08 MED ORDER — HYDROCODONE-ACETAMINOPHEN 5-325 MG PO TABS
1.0000 | ORAL_TABLET | Freq: Four times a day (QID) | ORAL | Status: DC | PRN
  Administered 2022-02-08 – 2022-02-11 (×6): 2 via ORAL
  Filled 2022-02-08 (×6): qty 2

## 2022-02-08 MED ORDER — CHLORHEXIDINE GLUCONATE 4 % EX LIQD: Freq: Once | CUTANEOUS | Status: AC

## 2022-02-08 MED ORDER — SODIUM CHLORIDE 0.9 % IV SOLN: INTRAVENOUS | Status: DC

## 2022-02-08 MED ORDER — NICOTINE 21 MG/24HR TD PT24
21.0000 mg | MEDICATED_PATCH | Freq: Every day | TRANSDERMAL | Status: DC
  Administered 2022-02-09 – 2022-02-11 (×3): 21 mg via TRANSDERMAL
  Filled 2022-02-08 (×3): qty 1

## 2022-02-08 MED ORDER — CEFAZOLIN SODIUM-DEXTROSE 2-4 GM/100ML-% IV SOLN
2.0000 g | INTRAVENOUS | Status: AC
  Administered 2022-02-09: 2 g via INTRAVENOUS
  Filled 2022-02-08: qty 100

## 2022-02-08 MED ORDER — MORPHINE SULFATE (PF) 2 MG/ML IV SOLN: 0.5000 mg | INTRAVENOUS | Status: DC | PRN

## 2022-02-08 MED ORDER — LACTATED RINGERS IV BOLUS
1000.0000 mL | Freq: Once | INTRAVENOUS | Status: AC
  Administered 2022-02-08: 1000 mL via INTRAVENOUS

## 2022-02-08 MED ORDER — MAGNESIUM SULFATE 2 GM/50ML IV SOLN
2.0000 g | Freq: Once | INTRAVENOUS | Status: AC
  Administered 2022-02-08: 2 g via INTRAVENOUS
  Filled 2022-02-08: qty 50

## 2022-02-08 MED ORDER — MORPHINE SULFATE (PF) 2 MG/ML IV SOLN
2.0000 mg | Freq: Once | INTRAVENOUS | Status: AC
  Administered 2022-02-08: 2 mg via INTRAVENOUS
  Filled 2022-02-08: qty 1

## 2022-02-08 MED ORDER — ACETAMINOPHEN 325 MG PO TABS: 650.0000 mg | ORAL_TABLET | ORAL | Status: DC | PRN

## 2022-02-08 MED ORDER — INSULIN ASPART 100 UNIT/ML IJ SOLN: 0.0000 [IU] | Freq: Three times a day (TID) | INTRAMUSCULAR | Status: DC
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
## 2022-02-08 NOTE — Assessment & Plan Note (Signed)
No recent history of fall.  Orthopedic was consulted. Going for surgery later today. -Continue with pain management -Follow-up postsurgical recommendations -PT/OT after the surgery
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
## 2022-02-08 NOTE — Assessment & Plan Note (Signed)
-  The patient will be placed on supplemental coverage with [employer]. - We will continue Neurontin.

## 2022-02-08 NOTE — ED Notes (Signed)
Primary RN Thedore Mins at bedside. EDP, Dr. Charna Archer at bedside.
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
## 2022-02-08 NOTE — Assessment & Plan Note (Signed)
Corrected sodium within normal limit.  Pseudohyponatremia
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
## 2022-02-08 NOTE — Assessment & Plan Note (Signed)
-  She was counseled for smoking cessation and will receive further counseling here. - Given her current craving, we will place her on NicoDerm CQ patch.

## 2022-02-08 NOTE — ED Notes (Signed)
IV team at bedside 

## 2022-02-08 NOTE — Assessment & Plan Note (Signed)
-  The patient will be placed on IV Rocephin. - Warm compresses will be applied. - Pain management will be provided.

## 2022-02-08 NOTE — ED Notes (Addendum)
Pt states she has not eaten much recently. Pt also states she sees a foot doctor for right foot, swelling is recent but has had broken bones in that right foot for "a while"

## 2022-02-08 NOTE — Assessment & Plan Note (Signed)
-  Magnesium will be replaced.   

## 2022-02-08 NOTE — Assessment & Plan Note (Deleted)
-  We will continue Neurontin. ?

## 2022-02-08 NOTE — Assessment & Plan Note (Addendum)
Patient was on insulin pump at home.  Which she removed.  Had 1 episode of hypoglycemia as she was n.p.o. -Semglee 5 units daily -Renal SSI -2 units with meal if he ate more than 50%

## 2022-02-08 NOTE — ED Notes (Signed)
Patient transported to CT 

## 2022-02-08 NOTE — ED Notes (Signed)
Patient back from CT.
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
## 2022-02-08 NOTE — Progress Notes (Signed)
Report made with [facility]. They have no records of anyone making a report today.

## 2022-02-09 ENCOUNTER — Inpatient Hospital Stay: Payer: Medicare Other | Admitting: Anesthesiology

## 2022-02-09 ENCOUNTER — Encounter
Admission: EM | Disposition: A | Discharge status: Home Health | Payer: Self-pay | Source: Home / Self Care | Attending: Internal Medicine

## 2022-02-09 ENCOUNTER — Other Ambulatory Visit: Payer: Self-pay

## 2022-02-09 ENCOUNTER — Inpatient Hospital Stay: Payer: Medicare Other

## 2022-02-09 DIAGNOSIS — S72001A Fracture of unspecified part of neck of right femur, initial encounter for closed fracture: Secondary | ICD-10-CM | POA: Diagnosis not present

## 2022-02-09 HISTORY — PX: HIP PINNING,CANNULATED: SHX1758

## 2022-02-09 LAB — HEMOGLOBIN A1C
Hgb A1c MFr Bld: 7.6 % — ABNORMAL HIGH (ref 4.8–5.6)
Mean Plasma Glucose: 171.42 mg/dL

## 2022-02-09 LAB — CBC
HCT: 38.4 % (ref 36.0–46.0)
Hemoglobin: 12.3 g/dL (ref 12.0–15.0)
MCH: 29.5 pg (ref 26.0–34.0)
MCHC: 32 g/dL (ref 30.0–36.0)
MCV: 92.1 fL (ref 80.0–100.0)
Platelets: 261 10*3/uL (ref 150–400)
RBC: 4.17 MIL/uL (ref 3.87–5.11)
RDW: 14.8 % (ref 11.5–15.5)
WBC: 11.4 10*3/uL — ABNORMAL HIGH (ref 4.0–10.5)
nRBC: 0 % (ref 0.0–0.2)

## 2022-02-09 LAB — CREATININE, SERUM
Creatinine, Ser: 0.67 mg/dL (ref 0.44–1.00)
GFR, Estimated: >60 mL/min (ref >60)

## 2022-02-09 LAB — GLUCOSE, CAPILLARY
Glucose-Capillary: 189 mg/dL — ABNORMAL HIGH (ref 70–99)
Glucose-Capillary: 67 mg/dL — ABNORMAL LOW (ref 70–99)
Glucose-Capillary: 75 mg/dL (ref 70–99)
Glucose-Capillary: 38 mg/dL — CL (ref 70–99)
Glucose-Capillary: 131 mg/dL — ABNORMAL HIGH (ref 70–99)
Glucose-Capillary: 99 mg/dL (ref 70–99)
Glucose-Capillary: 176 mg/dL — ABNORMAL HIGH (ref 70–99)
Glucose-Capillary: 384 mg/dL — ABNORMAL HIGH (ref 70–99)
Glucose-Capillary: 493 mg/dL — ABNORMAL HIGH (ref 70–99)

## 2022-02-09 LAB — HIV ANTIBODY (ROUTINE TESTING W REFLEX): HIV Screen 4th Generation wRfx: NONREACTIVE

## 2022-02-09 SURGERY — FIXATION, FEMUR, NECK, PERCUTANEOUS, USING SCREW
Anesthesia: General | Site: Hip | Laterality: Right

## 2022-02-09 MED ORDER — METOCLOPRAMIDE HCL 10 MG PO TABS: 5.0000 mg | ORAL_TABLET | Freq: Three times a day (TID) | ORAL | Status: DC | PRN

## 2022-02-09 MED ORDER — PROPOFOL 10 MG/ML IV BOLUS
INTRAVENOUS | Status: DC | PRN
  Administered 2022-02-09: 100 mg via INTRAVENOUS
  Administered 2022-02-09: 100 ug/kg/min via INTRAVENOUS

## 2022-02-09 MED ORDER — INSULIN ASPART 100 UNIT/ML IJ SOLN
2.0000 [IU] | Freq: Three times a day (TID) | INTRAMUSCULAR | Status: DC
  Administered 2022-02-10: 2 [IU] via SUBCUTANEOUS
  Filled 2022-02-09 (×2): qty 1

## 2022-02-09 MED ORDER — FENTANYL CITRATE (PF) 100 MCG/2ML IJ SOLN
INTRAMUSCULAR | Status: AC
  Administered 2022-02-09: 25 ug via INTRAVENOUS
  Filled 2022-02-09: qty 2

## 2022-02-09 MED ORDER — DEXTROSE 50 % IV SOLN
25.0000 g | INTRAVENOUS | Status: AC
  Administered 2022-02-09: 25 g via INTRAVENOUS
  Filled 2022-02-09: qty 50

## 2022-02-09 MED ORDER — INSULIN ASPART 100 UNIT/ML IJ SOLN
0.0000 [IU] | Freq: Every day | INTRAMUSCULAR | Status: DC
  Administered 2022-02-09: 5 [IU] via SUBCUTANEOUS
  Filled 2022-02-09: qty 1

## 2022-02-09 MED ORDER — ROCURONIUM BROMIDE 100 MG/10ML IV SOLN
INTRAVENOUS | Status: DC | PRN
  Administered 2022-02-09: 50 mg via INTRAVENOUS

## 2022-02-09 MED ORDER — BISACODYL 10 MG RE SUPP: 10.0000 mg | Freq: Every day | RECTAL | Status: DC | PRN

## 2022-02-09 MED ORDER — 0.9 % SODIUM CHLORIDE (POUR BTL) OPTIME
TOPICAL | Status: DC | PRN
  Administered 2022-02-09: 500 mL

## 2022-02-09 MED ORDER — ADULT MULTIVITAMIN W/MINERALS CH
1.0000 | ORAL_TABLET | Freq: Every day | ORAL | Status: DC
  Administered 2022-02-10 – 2022-02-11 (×2): 1 via ORAL
  Filled 2022-02-09 (×2): qty 1

## 2022-02-09 MED ORDER — GABAPENTIN 300 MG PO CAPS
300.0000 mg | ORAL_CAPSULE | Freq: Four times a day (QID) | ORAL | Status: DC
  Administered 2022-02-09 – 2022-02-11 (×7): 300 mg via ORAL
  Filled 2022-02-09 (×7): qty 1

## 2022-02-09 MED ORDER — ROPINIROLE HCL 1 MG PO TABS
1.5000 mg | ORAL_TABLET | Freq: Every day | ORAL | Status: DC
  Administered 2022-02-09 – 2022-02-10 (×2): 1.5 mg via ORAL
  Filled 2022-02-09 (×2): qty 2

## 2022-02-09 MED ORDER — INSULIN ASPART 100 UNIT/ML IJ SOLN
0.0000 [IU] | Freq: Three times a day (TID) | INTRAMUSCULAR | Status: DC
  Administered 2022-02-09 – 2022-02-10 (×2): 1 [IU] via SUBCUTANEOUS
  Administered 2022-02-10: 5 [IU] via SUBCUTANEOUS
  Administered 2022-02-10: 8 [IU] via SUBCUTANEOUS
  Administered 2022-02-11: 1 [IU] via SUBCUTANEOUS
  Filled 2022-02-09 (×4): qty 1

## 2022-02-09 MED ORDER — METOCLOPRAMIDE HCL 5 MG/ML IJ SOLN: 5.0000 mg | Freq: Three times a day (TID) | INTRAMUSCULAR | Status: DC | PRN

## 2022-02-09 MED ORDER — FERROUS SULFATE 325 (65 FE) MG PO TABS
325.0000 mg | ORAL_TABLET | Freq: Every day | ORAL | Status: DC
  Administered 2022-02-10 – 2022-02-11 (×2): 325 mg via ORAL
  Filled 2022-02-09 (×2): qty 1

## 2022-02-09 MED ORDER — CELECOXIB 100 MG PO CAPS
100.0000 mg | ORAL_CAPSULE | Freq: Two times a day (BID) | ORAL | Status: DC
  Administered 2022-02-09 – 2022-02-11 (×4): 100 mg via ORAL
  Filled 2022-02-09 (×4): qty 1

## 2022-02-09 MED ORDER — PHENYLEPHRINE HCL (PRESSORS) 10 MG/ML IV SOLN
INTRAVENOUS | Status: DC | PRN
  Administered 2022-02-09: 80 ug via INTRAVENOUS

## 2022-02-09 MED ORDER — MIDAZOLAM HCL 2 MG/2ML IJ SOLN
INTRAMUSCULAR | Status: DC | PRN
  Administered 2022-02-09 (×2): 1 mg via INTRAVENOUS

## 2022-02-09 MED ORDER — ALUM & MAG HYDROXIDE-SIMETH 200-200-20 MG/5ML PO SUSP: 30.0000 mL | ORAL | Status: DC | PRN

## 2022-02-09 MED ORDER — DEXMEDETOMIDINE HCL IN NACL 200 MCG/50ML IV SOLN
INTRAVENOUS | Status: DC | PRN
  Administered 2022-02-09: 8 ug via INTRAVENOUS
  Administered 2022-02-09: 4 ug via INTRAVENOUS

## 2022-02-09 MED ORDER — IPRATROPIUM BROMIDE 0.02 % IN SOLN: 0.5000 mg | Freq: Four times a day (QID) | RESPIRATORY_TRACT | Status: DC | PRN

## 2022-02-09 MED ORDER — ACETAMINOPHEN 10 MG/ML IV SOLN
INTRAVENOUS | Status: AC
  Filled 2022-02-09: qty 100

## 2022-02-09 MED ORDER — MENTHOL 3 MG MT LOZG: 1.0000 | LOZENGE | OROMUCOSAL | Status: DC | PRN

## 2022-02-09 MED ORDER — FENTANYL CITRATE (PF) 100 MCG/2ML IJ SOLN
25.0000 ug | INTRAMUSCULAR | Status: DC | PRN
  Administered 2022-02-09 (×3): 25 ug via INTRAVENOUS

## 2022-02-09 MED ORDER — INSULIN GLARGINE-YFGN 100 UNIT/ML ~~LOC~~ SOLN
5.0000 [IU] | Freq: Every day | SUBCUTANEOUS | Status: DC
  Filled 2022-02-09: qty 0.05

## 2022-02-09 MED ORDER — OXYCODONE HCL 5 MG/5ML PO SOLN: 5.0000 mg | Freq: Once | ORAL | Status: DC | PRN

## 2022-02-09 MED ORDER — MIDAZOLAM HCL 2 MG/2ML IJ SOLN
INTRAMUSCULAR | Status: AC
  Filled 2022-02-09: qty 2

## 2022-02-09 MED ORDER — FENTANYL CITRATE (PF) 100 MCG/2ML IJ SOLN
INTRAMUSCULAR | Status: AC
  Filled 2022-02-09: qty 2

## 2022-02-09 MED ORDER — CYCLOBENZAPRINE HCL 10 MG PO TABS
10.0000 mg | ORAL_TABLET | Freq: Three times a day (TID) | ORAL | Status: DC | PRN
  Administered 2022-02-10: 10 mg via ORAL
  Filled 2022-02-09: qty 1

## 2022-02-09 MED ORDER — INSULIN ASPART 100 UNIT/ML IJ SOLN: 0.0000 [IU] | Freq: Three times a day (TID) | INTRAMUSCULAR | Status: DC

## 2022-02-09 MED ORDER — ONDANSETRON HCL 4 MG/2ML IJ SOLN
INTRAMUSCULAR | Status: DC | PRN
  Administered 2022-02-09: 4 mg via INTRAVENOUS

## 2022-02-09 MED ORDER — BUPIVACAINE-EPINEPHRINE (PF) 0.5% -1:200000 IJ SOLN
INTRAMUSCULAR | Status: DC | PRN
  Administered 2022-02-09: 30 mL

## 2022-02-09 MED ORDER — VITAMIN D (ERGOCALCIFEROL) 1.25 MG (50000 UNIT) PO CAPS
50000.0000 [IU] | ORAL_CAPSULE | ORAL | Status: DC
  Administered 2022-02-09: 50000 [IU] via ORAL
  Filled 2022-02-09: qty 1

## 2022-02-09 MED ORDER — BUPIVACAINE-EPINEPHRINE (PF) 0.5% -1:200000 IJ SOLN
INTRAMUSCULAR | Status: AC
  Filled 2022-02-09: qty 30

## 2022-02-09 MED ORDER — INSULIN GLARGINE-YFGN 100 UNIT/ML ~~LOC~~ SOLN
5.0000 [IU] | Freq: Every day | SUBCUTANEOUS | Status: DC
  Administered 2022-02-09: 5 [IU] via SUBCUTANEOUS
  Filled 2022-02-09 (×2): qty 0.05

## 2022-02-09 MED ORDER — ALBUTEROL SULFATE (2.5 MG/3ML) 0.083% IN NEBU: 3.0000 mL | INHALATION_SOLUTION | Freq: Four times a day (QID) | RESPIRATORY_TRACT | Status: DC | PRN

## 2022-02-09 MED ORDER — ACETAMINOPHEN 10 MG/ML IV SOLN
INTRAVENOUS | Status: DC | PRN
  Administered 2022-02-09: 1000 mg via INTRAVENOUS

## 2022-02-09 MED ORDER — HYDRALAZINE HCL 20 MG/ML IJ SOLN
INTRAMUSCULAR | Status: DC | PRN
  Administered 2022-02-09: 10 mg via INTRAVENOUS

## 2022-02-09 MED ORDER — GABAPENTIN 300 MG PO CAPS: 300.0000 mg | ORAL_CAPSULE | Freq: Two times a day (BID) | ORAL | Status: DC

## 2022-02-09 MED ORDER — FENTANYL CITRATE (PF) 100 MCG/2ML IJ SOLN
INTRAMUSCULAR | Status: DC | PRN
  Administered 2022-02-09 (×4): 50 ug via INTRAVENOUS

## 2022-02-09 MED ORDER — CEFAZOLIN SODIUM-DEXTROSE 2-4 GM/100ML-% IV SOLN
2.0000 g | Freq: Three times a day (TID) | INTRAVENOUS | Status: AC
  Administered 2022-02-09 – 2022-02-10 (×3): 2 g via INTRAVENOUS
  Filled 2022-02-09 (×4): qty 100

## 2022-02-09 MED ORDER — ZOLPIDEM TARTRATE 5 MG PO TABS: 5.0000 mg | ORAL_TABLET | Freq: Every evening | ORAL | Status: DC | PRN

## 2022-02-09 MED ORDER — DEXAMETHASONE SODIUM PHOSPHATE 10 MG/ML IJ SOLN
INTRAMUSCULAR | Status: DC | PRN
  Administered 2022-02-09: 10 mg via INTRAVENOUS

## 2022-02-09 MED ORDER — OXYCODONE HCL 5 MG PO TABS: 5.0000 mg | ORAL_TABLET | Freq: Once | ORAL | Status: DC | PRN

## 2022-02-09 MED ORDER — FLEET ENEMA 7-19 GM/118ML RE ENEM: 1.0000 | ENEMA | Freq: Once | RECTAL | Status: DC | PRN

## 2022-02-09 MED ORDER — PHENOL 1.4 % MT LIQD: 1.0000 | OROMUCOSAL | Status: DC | PRN

## 2022-02-09 MED ORDER — MAGNESIUM HYDROXIDE 400 MG/5ML PO SUSP
30.0000 mL | Freq: Every day | ORAL | Status: DC | PRN
  Administered 2022-02-10 – 2022-02-11 (×2): 30 mL via ORAL

## 2022-02-09 MED ORDER — SODIUM CHLORIDE 0.45 % IV SOLN: INTRAVENOUS | Status: DC

## 2022-02-09 MED ORDER — SUGAMMADEX SODIUM 200 MG/2ML IV SOLN
INTRAVENOUS | Status: DC | PRN
  Administered 2022-02-09: 100 mg via INTRAVENOUS

## 2022-02-09 MED ORDER — ROCURONIUM BROMIDE 10 MG/ML (PF) SYRINGE
PREFILLED_SYRINGE | INTRAVENOUS | Status: AC
  Filled 2022-02-09: qty 30

## 2022-02-09 MED ORDER — LIDOCAINE HCL (CARDIAC) PF 100 MG/5ML IV SOSY
PREFILLED_SYRINGE | INTRAVENOUS | Status: DC | PRN
  Administered 2022-02-09: 100 mg via INTRAVENOUS

## 2022-02-09 MED ORDER — SODIUM CHLORIDE 0.9 % IV SOLN
1.0000 g | INTRAVENOUS | Status: DC
  Administered 2022-02-09 – 2022-02-10 (×2): 1 g via INTRAVENOUS
  Filled 2022-02-09: qty 1
  Filled 2022-02-09 (×2): qty 10

## 2022-02-09 MED ORDER — MORPHINE SULFATE (PF) 2 MG/ML IV SOLN
0.5000 mg | INTRAVENOUS | Status: DC | PRN
  Administered 2022-02-09: 0.5 mg via INTRAVENOUS
  Filled 2022-02-09: qty 1

## 2022-02-09 MED ORDER — SENNA 8.6 MG PO TABS
1.0000 | ORAL_TABLET | Freq: Two times a day (BID) | ORAL | Status: DC
  Administered 2022-02-09 – 2022-02-11 (×4): 8.6 mg via ORAL
  Filled 2022-02-09 (×4): qty 1

## 2022-02-09 MED ORDER — ENOXAPARIN SODIUM 30 MG/0.3ML IJ SOSY
30.0000 mg | PREFILLED_SYRINGE | INTRAMUSCULAR | Status: DC
  Administered 2022-02-10 – 2022-02-11 (×2): 30 mg via SUBCUTANEOUS
  Filled 2022-02-09 (×2): qty 0.3

## 2022-02-09 SURGICAL SUPPLY — 32 items
APL PRP STRL LF DISP 70% ISPRP (MISCELLANEOUS) ×1
BIT DRILL CANN LRG QC 5X300 (BIT) IMPLANT
BLADE SURG SZ11 CARB STEEL (BLADE) ×1 IMPLANT
BNDG CMPR 5X4 CHSV STRCH STRL (GAUZE/BANDAGES/DRESSINGS)
BNDG CMPR 5X6 CHSV STRCH STRL (GAUZE/BANDAGES/DRESSINGS) ×1
BNDG COHESIVE 4X5 TAN STRL LF (GAUZE/BANDAGES/DRESSINGS) ×1 IMPLANT
BNDG COHESIVE 6X5 TAN ST LF (GAUZE/BANDAGES/DRESSINGS) IMPLANT
CHLORAPREP W/TINT 26 (MISCELLANEOUS) ×1 IMPLANT
DRSG AQUACEL AG ADV 3.5X 4 (GAUZE/BANDAGES/DRESSINGS) IMPLANT
DRSG AQUACEL AG ADV 3.5X10 (GAUZE/BANDAGES/DRESSINGS) ×1 IMPLANT
GAUZE SPONGE 4X4 12PLY STRL (GAUZE/BANDAGES/DRESSINGS) ×1 IMPLANT
GLOVE BIO SURGEON STRL SZ8.5 (GLOVE) ×1 IMPLANT
GOWN STRL REUS W/ TWL LRG LVL3 (GOWN DISPOSABLE) ×1 IMPLANT
GOWN STRL REUS W/TWL LRG LVL3 (GOWN DISPOSABLE) ×1
GOWN STRL REUS W/TWL LRG LVL4 (GOWN DISPOSABLE) ×1 IMPLANT
GUIDEWIRE THREADED 2.8 (WIRE) IMPLANT
KIT TURNOVER KIT A (KITS) ×1 IMPLANT
MAT ABSORB  FLUID 56X50 GRAY (MISCELLANEOUS) ×1
MAT ABSORB FLUID 56X50 GRAY (MISCELLANEOUS) ×1 IMPLANT
NDL SPNL 18GX3.5 QUINCKE PK (NEEDLE) ×1 IMPLANT
NEEDLE SPNL 18GX3.5 QUINCKE PK (NEEDLE) ×1 IMPLANT
NS IRRIG 500ML POUR BTL (IV SOLUTION) ×1 IMPLANT
PACK HIP COMPR (MISCELLANEOUS) ×1 IMPLANT
SCREW CANN 6.5X100 32MM THRD (Screw) IMPLANT
SCREW CANN 6.5X105 32MM THRD (Screw) IMPLANT
SCREW CANN 6.5X110 32MM THRD (Screw) IMPLANT
SCREW CANN 6.5X95 (Screw) IMPLANT
SOL PREP PVP 2OZ (MISCELLANEOUS) ×1
SOLUTION PREP PVP 2OZ (MISCELLANEOUS) ×1 IMPLANT
SUT ETHILON 3 0 FSLX (SUTURE) ×1 IMPLANT
SYR 30ML LL (SYRINGE) ×1 IMPLANT
WATER STERILE IRR 500ML POUR (IV SOLUTION) ×1 IMPLANT

## 2022-02-09 NOTE — Consult Note (Signed)
ORTHOPAEDIC CONSULTATION  REQUESTING PHYSICIAN: Arnetha Courser, MD  Chief Complaint: Right hip pain  HPI: Suzanne Snyder is a 71 y.o. female who complains of gradually increasing right hip pain over several days at home.  She denies a fall.  She was caring for her husband with Alzheimer's until a week ago helping to lift him.  She had a left hip fracture 16 years ago that was repaired.  She is seen in the emergency room where exam and x-rays revealed a impacted subcapital fracture of the right hip.  She was admitted for medical evaluation and clearance for surgery.  I have discussed the fracture with the patient and recommended percutaneous pinning.  She is in agreement with this.  The risks and benefits and postop protocol were also discussed.  Will plan to do surgery today.  Past Medical History:  Diagnosis Date   Anxiety    Arthritis    Asthma    Diabetes mellitus without complication (HCC)    Insulin pump in place    Osteoporosis    RLS (restless legs syndrome)    Smokers' cough (HCC)    Wears dentures    full upper and lower   Past Surgical History:  Procedure Laterality Date   CATARACT EXTRACTION W/PHACO Right 02/20/2020   Procedure: CATARACT EXTRACTION PHACO AND INTRAOCULAR LENS PLACEMENT (IOC) RIGHT 5.60 00:36.3;  Surgeon: Galen Manila, MD;  Location: [facility];  Service: Ophthalmology;  Laterality: Right;  Diabetic - insulin pump Requests not first   CATARACT EXTRACTION W/PHACO Left 03/05/2020   Procedure: CATARACT EXTRACTION PHACO AND INTRAOCULAR LENS PLACEMENT (IOC) LEFT 4.94 00:31.6;  Surgeon: Galen Manila, MD;  Location: [facility];  Service: Ophthalmology;  Laterality: Left;   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   ROTATOR CUFF REPAIR Left 2014 and 2015   SPINE SURGERY N/A 1984   L4-5, not sure of procedure   THYROID SURGERY     Social History   Socioeconomic History   Marital status: Married    Spouse name: Not on file   Number of  children: Not on file   Years of education: Not on file   Highest education level: Not on file  Occupational History   Not on file  Tobacco Use   Smoking status: Every Day    Packs/day: 1.00    Years: 50.00    Total pack years: 50.00    Types: Cigarettes   Smokeless tobacco: Never   Tobacco comments:    started smoking age 66  Vaping Use   Vaping Use: Former   Devices: "tried itChiropractor and Sexual Activity   Alcohol use: No   Drug use: No   Sexual activity: Not on file  Other Topics Concern   Not on file  Social History Narrative   Not on file   Social Determinants of Health   Financial Resource Strain: Not on file  Food Insecurity: No Food Insecurity (02/08/2022)   Hunger Vital Sign    Worried About Running Out of Food in the Last Year: Never true    Ran Out of Food in the Last Year: Never true  Transportation Needs: No Transportation Needs (02/08/2022)   PRAPARE - Administrator, Civil Service (Medical): No    Lack of Transportation (Non-Medical): No  Physical Activity: Not on file  Stress: Not on file  Social Connections: Not on file   Family History  Problem Relation Age of Onset   Parkinsonism Father  Allergies  Allergen Reactions   Pregabalin Anaphylaxis   Erythromycin Other (See Comments)    Severe stomach upset   Prior to Admission medications   Medication Sig Start Date End Date Taking? Authorizing Provider  acyclovir (ZOVIRAX) 400 MG tablet Take 400 mg by mouth 2 (two) times daily as needed.    [provider]  albuterol (VENTOLIN HFA) 108 (90 Base) MCG/ACT inhaler Inhale into the lungs every 6 (six) hours as needed for wheezing or shortness of breath.    [provider]  ammonium lactate (LAC-HYDRIN) 12 % cream Apply topically as needed for dry skin. Apply to affected areas of hands and feet 03/19/20   Brendolyn Patty, MD  aspirin 81 MG EC tablet Take 81 mg by mouth daily. Swallow whole.    [provider]   celecoxib (CELEBREX) 100 MG capsule Take 100 mg by mouth 2 (two) times daily.    [provider]  clobetasol cream (TEMOVATE) 0.05 % Apply to aa's rash on the hands and elbows BID for two weeks. Avoid applying to face, groin, and axilla. Use as directed. Long-term use can cause thinning of the skin. 09/17/21   Ralene Bathe, MD  clonazePAM (KLONOPIN) 0.5 MG tablet Take 0.5 mg by mouth 2 (two) times daily as needed for anxiety.    [provider]  cyclobenzaprine (FLEXERIL) 10 MG tablet Take 10 mg by mouth 3 (three) times daily as needed for muscle spasms.    [provider]  diphenhydrAMINE HCl (BENADRYL ALLERGY PO) Take 50 mg by mouth as needed.    [provider]  gabapentin (NEURONTIN) 300 MG capsule Take 300 mg by mouth 4 (four) times daily.    [provider]  hydrOXYzine (ATARAX/VISTARIL) 10 MG tablet TAKE 1 OR 2 TABLET BY MOUTH THREE TIMES DAILY AS NEEDED FOR [facility] 03/19/20   Brendolyn Patty, MD  insulin aspart (NOVOLOG) 100 UNIT/ML injection Inject into the skin 4 (four) times daily as needed for high blood sugar (with meals and at bedtime). Sliding scale    [provider]  ipratropium (ATROVENT) 0.02 % nebulizer solution Take 0.5 mg by nebulization every 6 (six) hours as needed for wheezing or shortness of breath.    [provider]  Multiple Vitamin (MULTIVITAMIN ADULT PO) Take by mouth 2 (two) times daily.    [provider]  Omega-3 Fatty Acids (FISH OIL) 1000 MG CAPS Take 1.5 capsules by mouth 2 (two) times daily. 08/04/19   [provider]  rOPINIRole (REQUIP) 1 MG tablet Take 1.5 mg by mouth at bedtime.    [provider]  Saccharomyces boulardii (PROBIOTIC) 250 MG CAPS Take 1 capsule by mouth 3 times/day as needed-between meals & bedtime. 01/07/22   Merlyn Lot, MD  traMADol (ULTRAM) 50 MG tablet Take 1 tablet (50 mg total) by mouth every 12 (twelve) hours as needed for severe pain. Must last  30 days 12/29/21 03/29/22  Gillis Santa, MD  triamcinolone cream (KENALOG) 0.1 % Apply a small amount to affected area 1 to 2 times daily as needed for itchy areas on back, Avoid face, groin, and underarms. NEEDS APPT FOR FURTHER REFILLS 08/30/19   Brendolyn Patty, MD  Vitamin D, Ergocalciferol, (DRISDOL) 50000 UNITS CAPS capsule Take 50,000 Units by mouth 2 (two) times a week.    [provider]   CT Hip Right Wo Contrast  Result Date: 02/08/2022 CLINICAL DATA:  Right hip pain, possible proximal femoral fracture, initial encounter EXAM: CT OF THE  RIGHT HIP WITHOUT CONTRAST TECHNIQUE: Multidetector CT imaging of the right hip was performed according to the standard protocol. Multiplanar CT image reconstructions were also generated. RADIATION DOSE REDUCTION: This exam was performed according to the departmental dose-optimization program which includes automated exposure control, adjustment of the mA and/or kV according to patient size and/or use of iterative reconstruction technique. COMPARISON:  Plain film from earlier in the same day. FINDINGS: Bones/Joint/Cartilage Mildly impacted subcapital femoral neck fracture is noted in the proximal right femur. No pelvic fracture is seen. Mild right hip joint effusion is seen. Ligaments Suboptimally assessed by CT. Muscles and Tendons Surrounding musculature appears within normal limits. Soft tissues Pessary is noted in place. No other soft tissue abnormality is noted. IMPRESSION: Mildly impacted subcapital right femoral neck fracture. Electronically Signed   By: Alcide Clever M.D.   On: 02/08/2022 17:44   DG Ankle Complete Right  Result Date: 02/08/2022 CLINICAL DATA:  Right foot and leg swelling for last week and a half. EXAM: RIGHT FOOT COMPLETE - 3+ VIEW; RIGHT ANKLE - COMPLETE 3+ VIEW COMPARISON:  None Available. FINDINGS: Diffuse edema is present about the ankle. No definite effusion is present. No acute or healing fracture is present. Chronic  degenerative changes are again noted in the midfoot, greatest at the first and second TMT joints. Extensive soft tissue swelling extends over the foot. No acute osseous abnormality is present. IMPRESSION: 1. Extensive soft tissue swelling about the ankle and foot. This most likely represents edema or cellulitis. 2. No acute osseous abnormality. 3. Chronic degenerative changes in the foot, greatest at the first and second TMT joints. Electronically Signed   By: Marin Roberts M.D.   On: 02/08/2022 15:36   DG Foot Complete Right  Result Date: 02/08/2022 CLINICAL DATA:  Right foot and leg swelling for last week and a half. EXAM: RIGHT FOOT COMPLETE - 3+ VIEW; RIGHT ANKLE - COMPLETE 3+ VIEW COMPARISON:  None Available. FINDINGS: Diffuse edema is present about the ankle. No definite effusion is present. No acute or healing fracture is present. Chronic degenerative changes are again noted in the midfoot, greatest at the first and second TMT joints. Extensive soft tissue swelling extends over the foot. No acute osseous abnormality is present. IMPRESSION: 1. Extensive soft tissue swelling about the ankle and foot. This most likely represents edema or cellulitis. 2. No acute osseous abnormality. 3. Chronic degenerative changes in the foot, greatest at the first and second TMT joints. Electronically Signed   By: Marin Roberts M.D.   On: 02/08/2022 15:36   DG Hip Unilat W or Wo Pelvis 2-3 Views Right  Result Date: 02/08/2022 CLINICAL DATA:  Right hip pain. EXAM: DG HIP (WITH OR WITHOUT PELVIS) 3V RIGHT COMPARISON:  None Available. FINDINGS: Right hip is located. Degenerative changes are present. No acute or healing fractures are present. Visualized pelvis is unremarkable. Left proximal femur ORIF noted. Degenerative changes are present in the lower lumbar spine. IMPRESSION: 1. Degenerative changes of the right hip without acute or healing fracture. If the patient has ongoing pain or is unable to bear  weight, CT or MRI could be used for further evaluation. 2. Left proximal femur ORIF. Electronically Signed   By: Marin Roberts M.D.   On: 02/08/2022 15:31    Positive ROS: All other systems have been reviewed and were otherwise negative with the exception of those mentioned in the HPI and as above.  Physical Exam: General: Alert, no acute distress Cardiovascular: No pedal edema Respiratory: No  cyanosis, no use of accessory musculature GI: No organomegaly, abdomen is soft and non-tender Skin: No lesions in the area of chief complaint Neurologic: Sensation intact distally Psychiatric: Patient is competent for consent with normal mood and affect Lymphatic: No axillary or cervical lymphadenopathy  MUSCULOSKELETAL: Patient is alert and awake.  She complains of right hip pain only.  There are some pain with motion of the right hip.  There is no shortening.  Neurovascular status is good.  Left hip has no pain and no shortening.  Upper extremities are normal.  Assessment: Impacted subcapital fracture right hip  Plan: Percutaneous pinning right hip    Park Breed, MD [phone]   02/09/2022 2:35 PM

## 2022-02-09 NOTE — Plan of Care (Signed)
  Problem: Education: Goal: Knowledge of General Education information will improve Description: Including pain rating scale, medication(s)/side effects and non-pharmacologic comfort measures Outcome: Progressing   Problem: Health Behavior/Discharge Planning: Goal: Ability to manage health-related needs will improve Outcome: Progressing   Problem: Clinical Measurements: Goal: Diagnostic test results will improve Outcome: Progressing Goal: Cardiovascular complication will be avoided Outcome: Progressing   Problem: Activity: Goal: Risk for activity intolerance will decrease Outcome: Progressing   Problem: Nutrition: Goal: Adequate nutrition will be maintained Outcome: Progressing   Problem: Elimination: Goal: Will not experience complications related to bowel motility Outcome: Progressing Goal: Will not experience complications related to urinary retention Outcome: Progressing
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
## 2022-02-09 NOTE — Inpatient Diabetes Management (Addendum)
Inpatient Diabetes Program Recommendations  AACE/ADA: New Consensus Statement on Inpatient Glycemic Control (2015)  Target Ranges:  Prepandial:   less than 140 mg/dL      Peak postprandial:   less than 180 mg/dL (1-2 hours)      Critically ill patients:  140 - 180 mg/dL    Latest Reference Range & Units 02/09/22 02:46 02/09/22 07:57 02/09/22 08:19  Glucose-Capillary 70 - 99 mg/dL 189 (H) 67 (L) 75    Latest Reference Range & Units 02/08/22 16:32  Glucose 70 - 99 mg/dL 154 (H)    Latest Reference Range & Units 02/09/22 02:36  Hemoglobin A1C 4.8 - 5.6 % 7.6 (H)   Review of Glycemic Control  Diabetes history: DM1 (does NOT make any insulin; requires basal, correction, and carbohydrate coverage insulin) Outpatient Diabetes medications: OmniPod insulin pump with Novolog Current orders for Inpatient glycemic control: Novolog 0-9 units TID with meals  Inpatient Diabetes Program Recommendations:    Insulin: Please have patient remove OmniPod insulin pump and order Semglee 5 units Q24H, Novolog 0-6 units TID with meals, Novolog 0-5 units QHS, and once diet is resumed, Novolog 2 units TID with meals for meal coverage if patient eats at least 50% of meals.  NOTE: Patient admitted on 02/08/22 with hip fx and plan for surgery today. Patient has DM1, uses an OmniPod insulin pump, and sees Dr. Gabriel Carina (Endocrinologist). Per chart, patient seen Dr. Gabriel Carina on 01/19/22 and pump settings are: Basal rate 12 am 0.65 units/hr 3 am 0.45 units/hr 6 am 0.85 units/hr 8 am 0.45 units/hr 12 pm 0.3 units/hr 6 pm 0.15 units/hr  24hr basal = 9.5 units  Bolus settings: Sensitivity 12 am = 90 mg/dl (1 unit drops 90 mg/dl) Carb ratio 12am = 18 (1 unit covers 18 grams of carbs) Target blood glucose 120-130  Spoke with patient over the phone to inquire about insulin pump. Patient confirms that she currently has on her OmniPod insulin pump and that her pump settings should be as noted in Dr. Joycie Peek office note on  01/19/22.  Patient reports that she has not been using a CGM with her insulin pump and she is still using the OmniPod 4 (plans to upgrade to [employer] 5 in the near future).  Patient reports that her current OmniPod pump pod will expire sometime today (she is not sure what time). Patient does not have any extra pump supplies with her at the hospital and does not have anyone to bring additional supplies to the hospital.  Patient states that she only had a pack of graham crackers and 2 slices of [place] last night. Patient reports that she did not have any symptoms of hypoglycemia when glucose was 67 mg/dl this morning.  Per chart, patient is planned for surgery at 13:58 today. Since OmniPod is going to expire today,  discussed possibly removing current OmniPod and asking attending provider to order basal, correction, and meal coverage insulin SQ. Patient agreeable to removing pump and using SQ insulin if ordered.  Informed patient I would reach out to attending provider to ask about removing pump and ordering SQ insulin. Asked patient not to do anything with pump until I follow up with her.   Patient verbalized understanding of information and has no questions at this time. Sent chat message to Dr. Reesa Chew and Lucious Groves, LPN regarding conversation with patient and request to remove pump and use SQ insulin. Dr. Reesa Chew agreeable with plan to have patient remove insulin pump and use SQ basal, correction,  and meal coverage (once diet resumed after surgery).   Addendum 02/09/22@12 :39-Noted glucose 38 mg/dl at 11:44 which was treated with D50 and repeat CBG 131 mg/dl at 12:35. Spoke with patient again over the phone. She confirms that she removed her insulin pump about 15-20 minutes ago. Patient stated she did not know why she went low since her pump was not giving her any insulin. Explained that pump delivers hourly basal insulin even if no information is entered into PDM device. Patient states she was not aware of that.  Explained that glucose should stabilize and likely increase since pump has been removed. Explained that glucose will be closely monitored and if she has any other issues with hypoglycemia it would be treated with D50 since she is currently NPO. Will recommend to retime Semglee 5 units Q24H to QHS. Sent [place] to MD and LPN.  Thanks, Barnie Alderman, RN, MSN, [place] Diabetes Coordinator Inpatient Diabetes Program [phone] (Team Pager from 8am to [employer])

## 2022-02-09 NOTE — H&P (Signed)
THE PATIENT WAS SEEN PRIOR TO SURGERY TODAY.  HISTORY, ALLERGIES, HOME MEDICATIONS AND OPERATIVE PROCEDURE WERE REVIEWED. RISKS AND BENEFITS OF SURGERY DISCUSSED WITH PATIENT AGAIN.  NO CHANGES FROM INITIAL HISTORY AND PHYSICAL NOTED.    

## 2022-02-09 NOTE — Transfer of Care (Signed)
Immediate Anesthesia Transfer of Care Note  Patient: Suzanne Snyder  Procedure(s) Performed: PERCUTANEOUS FIXATION OF FEMORAL NECK (Right: Hip)  Patient Location: PACU  Anesthesia Type:General  Level of Consciousness: sedated  Airway & Oxygen Therapy: Patient Spontanous Breathing and Patient connected to face mask oxygen  Post-op Assessment: Report given to RN and Post -op Vital signs reviewed and stable  Post vital signs: Reviewed and stable  Last Vitals:  Vitals Value Taken Time  BP 109/47 02/09/22 1606  Temp 36.6 C 02/09/22 1606  Pulse 78 02/09/22 1609  Resp 11 02/09/22 1609  SpO2 98 % 02/09/22 1609  Vitals shown include unvalidated device data.  Last Pain:  Vitals:   02/09/22 1319  TempSrc: Temporal  PainSc: 7       Patients Stated Pain Goal: 3 (44/97/53 0051)  Complications: No notable events documented.
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
## 2022-02-09 NOTE — Progress Notes (Signed)
Initial Nutrition Assessment  DOCUMENTATION CODES:   Non-severe (moderate) malnutrition in context of acute illness/injury, Underweight  INTERVENTION:   -MVI with minerals daily -Fruit and yogurt with meals  NUTRITION DIAGNOSIS:   Moderate Malnutrition related to social / environmental circumstances as evidenced by mild fat depletion, moderate fat depletion, mild muscle depletion, moderate muscle depletion.  GOAL:   Patient will meet greater than or equal to 90% of their needs  MONITOR:   PO intake, Supplement acceptance, Diet advancement  REASON FOR ASSESSMENT:   Consult Assessment of nutrition requirement/status, Hip fracture protocol  ASSESSMENT:   Pt with medical history significant for anxiety, asthma, type 1 diabetes mellitus, restless leg syndrome and osteoarthritis, who presented with acute onset of right hip pain.  Pt admitted with closed rt hip fracture.   Reviewed I/O's: +403 ml x 24 hours  UOP: 850 ml x 24 hours   Per orthopedics notes, plan for OR today.   Spoke with pt at bedside, who reports feeling anxious about NPO and fall causing hip fracture. Pt reports she has had minimal support at home and her husband recently passed away. Pt reports she was not eating much PTA as she was having difficulty getting around and unable to get help preparing meals.   Per pt, she has missing bottom dentures. She typically eats a lot of fruit, vegetables, and yogurt. Pt admits to not eating a lot of protein, due to inability to cook meals. Offered to downgrade diet, however, pt refused saying "that's nasty".   Discussed importance of good meal and supplement intake to promote healing. Pt refusing nutritional supplements, but amenable to fruit and yogurt at meals.   Reviewed wt hx; wt has been stable over the past 7 months.   Medications reviewed and include 0.9% sodium chloride infusion @ 75 ml/hr.   Lab Results  Component Value Date   HGBA1C 7.6 (H) 02/09/2022    PTA DM medications are 4 units insulin aspart 4 times daily. Per pt, she had an omni pod at home, and blood sugars are erratic at baseline.   Labs reviewed: CBGS: 99-176 (inpatient orders for glycemic control are 0-5 units insulin aspart daily at bedtime, 0-6 units insulin aspart TID with meals, 2 units insulin aspart TID with meals, and 5 units insulin glargine-yfgn daily).    NUTRITION - FOCUSED PHYSICAL EXAM:  Flowsheet Row Most Recent Value  Orbital Region Mild depletion  Upper Arm Region Moderate depletion  Thoracic and Lumbar Region Mild depletion  Buccal Region Mild depletion  Temple Region Moderate depletion  Clavicle Bone Region Moderate depletion  Clavicle and Acromion Bone Region Moderate depletion  Scapular Bone Region Moderate depletion  Dorsal Hand Moderate depletion  Patellar Region Mild depletion  Anterior Thigh Region Mild depletion  Posterior Calf Region Mild depletion  Edema (RD Assessment) None  Hair Reviewed  Eyes Reviewed  Mouth Reviewed  Skin Reviewed  Nails Reviewed       Diet Order:   Diet Order             Diet NPO time specified  Diet effective midnight                   EDUCATION NEEDS:   Education needs have been addressed  Skin:  Skin Assessment: Skin Integrity Issues: Skin Integrity Issues:: Incisions Incisions: closed rt hip  Last BM:  02/09/22 (type 3)  Height:   Ht Readings from Last 1 Encounters:  02/09/22 5\' 8"  (1.727 m)  Weight:   Wt Readings from Last 1 Encounters:  02/09/22 51.7 kg    Ideal Body Weight:  63.6 kg  BMI:  Body mass index is 17.33 kg/m.  Estimated Nutritional Needs:   Kcal:  1550-1750  Protein:  75-90 grams  Fluid:  > 1.5 L    Loistine Chance, RD, LDN, [place] Registered Dietitian II Certified Diabetes Care and Education Specialist Please refer to [facility] for RD and/or RD on-call/weekend/after hours pager

## 2022-02-09 NOTE — Progress Notes (Signed)
Cbg of 38 1144 Hypoglycemic protocol in place in place Admin D50, cbg now 131

## 2022-02-09 NOTE — Anesthesia Procedure Notes (Signed)
Procedure Name: Intubation Date/Time: 02/09/2022 2:59 PM  Performed by: Patience Musca., CRNAPre-anesthesia Checklist: Patient identified, Patient being monitored, Timeout performed, Emergency Drugs available and Suction available Patient Re-evaluated:Patient Re-evaluated prior to induction Oxygen Delivery Method: Circle system utilized Preoxygenation: Pre-oxygenation with 100% oxygen Induction Type: IV induction Ventilation: Mask ventilation without difficulty Laryngoscope Size: McGraph and 4 Grade View: Grade I Tube type: Oral Tube size: 7.0 mm Number of attempts: 1 Airway Equipment and Method: Stylet Placement Confirmation: ETT inserted through vocal cords under direct vision, positive ETCO2 and breath sounds checked- equal and bilateral Secured at: 21 cm Tube secured with: Tape Dental Injury: Teeth and Oropharynx as per pre-operative assessment

## 2022-02-09 NOTE — Anesthesia Preprocedure Evaluation (Signed)
Anesthesia Evaluation  Patient identified by MRN, date of birth, ID band Patient awake    Reviewed: Allergy & Precautions, NPO status , Patient's Chart, lab work & pertinent test results  History of Anesthesia Complications Negative for: history of anesthetic complications  Airway Mallampati: II  TM Distance: >3 FB Neck ROM: full    Dental  (+) Upper Dentures   Pulmonary asthma , Current Smoker and Patient abstained from smoking.   Pulmonary exam normal        Cardiovascular negative cardio ROS Normal cardiovascular exam     Neuro/Psych  PSYCHIATRIC DISORDERS Anxiety      Neuromuscular disease    GI/Hepatic negative GI ROS, Neg liver ROS,,,  Endo/Other  negative endocrine ROSdiabetes, Type 1, Insulin Dependent    Renal/GU      Musculoskeletal  (+) Arthritis ,    Abdominal   Peds  Hematology negative hematology ROS (+)   Anesthesia Other Findings Past Medical History: No date: Anxiety No date: Arthritis No date: Asthma No date: Diabetes mellitus without complication (HCC) No date: Insulin pump in place No date: Osteoporosis No date: RLS (restless legs syndrome) No date: Smokers' cough ([place]) No date: Wears dentures     Comment:  full upper and lower  Past Surgical History: 02/20/2020: CATARACT EXTRACTION W/PHACO; Right     Comment:  Procedure: CATARACT EXTRACTION PHACO AND INTRAOCULAR               LENS PLACEMENT ([place]) RIGHT 5.60 00:36.3;  Surgeon:               Birder Robson, MD;  Location: [place];                Service: Ophthalmology;  Laterality: Right;  Diabetic -               insulin pump Requests not first 03/05/2020: CATARACT EXTRACTION W/PHACO; Left     Comment:  Procedure: CATARACT EXTRACTION PHACO AND INTRAOCULAR               LENS PLACEMENT ([facility]) LEFT 4.94 00:31.6;  Surgeon:               Birder Robson, MD;  Location: [facility];                Service:  Ophthalmology;  Laterality: Left; 2007: JOINT REPLACEMENT; Left     Comment:  total hip 2014 and 2015: [place]; Left 1984: [place]; N/A     Comment:  L4-5, not sure of procedure No date: THYROID SURGERY  BMI    Body Mass Index: 17.33 kg/m      Reproductive/Obstetrics negative OB ROS                             Anesthesia Physical Anesthesia Plan  ASA: 2  Anesthesia Plan: General ETT   Post-op Pain Management: Toradol IV (intra-op)*, Ofirmev IV (intra-op)* and Dilaudid IV   Induction: Intravenous  PONV Risk Score and Plan: Ondansetron, Dexamethasone, Midazolam and Treatment may vary due to age or medical condition  Airway Management Planned: Oral ETT  Additional Equipment:   Intra-op Plan:   Post-operative Plan: Extubation in OR  Informed Consent: I have reviewed the patients History and Physical, chart, labs and discussed the procedure including the risks, benefits and alternatives for the proposed anesthesia with the patient or authorized representative who has indicated his/her understanding and acceptance.  Dental Advisory Given  Plan Discussed with: Anesthesiologist, CRNA and Surgeon  Anesthesia Plan Comments: (Patient consented for risks of anesthesia including but not limited to:  - adverse reactions to medications - damage to eyes, teeth, lips or other oral mucosa - nerve damage due to positioning  - sore throat or hoarseness - Damage to heart, brain, nerves, lungs, other parts of body or loss of life  Patient voiced understanding.)        Anesthesia Quick Evaluation

## 2022-02-09 NOTE — Op Note (Signed)
02/09/2022  4:24 PM  PATIENT:  Suzanne Snyder    PRE-OPERATIVE DIAGNOSIS:  Right hip fracture-impacted subcapital  POST-OPERATIVE DIAGNOSIS:  Same  PROCEDURE:  PERCUTANEOUS FIXATION OF RIGHT FEMORAL NECK  SURGEON:  Park Breed, MD     ANESTHESIA:   General endotracheal  PREOPERATIVE INDICATIONS:  Suzanne Snyder is a  72 y.o. female who fell and was found to have a diagnosis of Right hip fracture who elected for surgical management.    The risks benefits and alternatives were discussed with the patient preoperatively including but not limited to the risks of infection, bleeding, nerve injury, cardiopulmonary complications, blood clots, malunion, nonunion, avascular necrosis, the need for revision surgery, the potential for conversion to hemiarthroplasty, among others, and the patient was willing to proceed.  OPERATIVE IMPLANTS: 6.76mm cannulated screws x4  OPERATIVE FINDINGS: Clinical osteoporosis with weak bone, proximal femur  OPERATIVE PROCEDURE: The patient was brought to the operating room and placed in supine position. IV antibiotics were given. General anesthesia administered.  The patient was placed on the fracture table. The operative extremity was positioned, without any significant reduction maneuver and was prepped and draped in usual sterile fashion.  Time out was performed.  Small incisions were made distal to the greater trochanter, and 4 guidewires were introduced into the head and neck. The lengths were measured. The reduction was slightly valgus, and near-anatomic. I opened the cortex with a cannulated drill, and then placed the screws into position. Satisfactory fixation was achieved.  The wounds were irrigated copiously, and repaired with  3-O Nylon with and sterile gauze. Sponge and needle count were correct.  There no complications and the patient tolerated the procedure well.  The patient will be touch down weightbearing as tolerated, and will be on Lovenox   for DVT prophylaxis.   Park Breed, MD
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
## 2022-02-10 ENCOUNTER — Encounter: Payer: Self-pay | Admitting: Specialist

## 2022-02-10 DIAGNOSIS — S72001A Fracture of unspecified part of neck of right femur, initial encounter for closed fracture: Secondary | ICD-10-CM | POA: Diagnosis not present

## 2022-02-10 DIAGNOSIS — E440 Moderate protein-calorie malnutrition: Secondary | ICD-10-CM | POA: Diagnosis present

## 2022-02-10 LAB — BASIC METABOLIC PANEL
Anion gap: 10 (ref 5–15)
Anion gap: 11 (ref 5–15)
BUN: 18 mg/dL (ref 8–23)
BUN: 22 mg/dL (ref 8–23)
CO2: 19 mmol/L — ABNORMAL LOW (ref 22–32)
CO2: 22 mmol/L (ref 22–32)
Calcium: 8 mg/dL — ABNORMAL LOW (ref 8.9–10.3)
Calcium: 9 mg/dL (ref 8.9–10.3)
Chloride: 102 mmol/L (ref 98–111)
Chloride: 99 mmol/L (ref 98–111)
Creatinine, Ser: 0.79 mg/dL (ref 0.44–1.00)
Creatinine, Ser: 0.86 mg/dL (ref 0.44–1.00)
GFR, Estimated: >60 mL/min (ref >60)
GFR, Estimated: >60 mL/min (ref >60)
Glucose, Bld: 422 mg/dL — ABNORMAL HIGH (ref 70–99)
Glucose, Bld: 186 mg/dL — ABNORMAL HIGH (ref 70–99)
Potassium: 4 mmol/L (ref 3.5–5.1)
Potassium: 4.6 mmol/L (ref 3.5–5.1)
Sodium: 131 mmol/L — ABNORMAL LOW (ref 135–145)
Sodium: 132 mmol/L — ABNORMAL LOW (ref 135–145)

## 2022-02-10 LAB — CBC
HCT: 39.9 % (ref 36.0–46.0)
Hemoglobin: 13 g/dL (ref 12.0–15.0)
MCH: 29.4 pg (ref 26.0–34.0)
MCHC: 32.6 g/dL (ref 30.0–36.0)
MCV: 90.3 fL (ref 80.0–100.0)
Platelets: 229 10*3/uL (ref 150–400)
RBC: 4.42 MIL/uL (ref 3.87–5.11)
RDW: 14.6 % (ref 11.5–15.5)
WBC: 11.2 10*3/uL — ABNORMAL HIGH (ref 4.0–10.5)
nRBC: 0 % (ref 0.0–0.2)

## 2022-02-10 LAB — GLUCOSE, CAPILLARY
Glucose-Capillary: 104 mg/dL — ABNORMAL HIGH (ref 70–99)
Glucose-Capillary: 188 mg/dL — ABNORMAL HIGH (ref 70–99)
Glucose-Capillary: 197 mg/dL — ABNORMAL HIGH (ref 70–99)
Glucose-Capillary: 384 mg/dL — ABNORMAL HIGH (ref 70–99)
Glucose-Capillary: 401 mg/dL — ABNORMAL HIGH (ref 70–99)
Glucose-Capillary: 405 mg/dL — ABNORMAL HIGH (ref 70–99)
Glucose-Capillary: 423 mg/dL — ABNORMAL HIGH (ref 70–99)
Glucose-Capillary: 439 mg/dL — ABNORMAL HIGH (ref 70–99)
Glucose-Capillary: 481 mg/dL — ABNORMAL HIGH (ref 70–99)

## 2022-02-10 MED ORDER — INSULIN GLARGINE-YFGN 100 UNIT/ML ~~LOC~~ SOLN
8.0000 [IU] | Freq: Every day | SUBCUTANEOUS | Status: DC
  Administered 2022-02-10 – 2022-02-11 (×2): 8 [IU] via SUBCUTANEOUS
  Filled 2022-02-10 (×2): qty 0.08

## 2022-02-10 MED ORDER — INSULIN ASPART 100 UNIT/ML IJ SOLN
3.0000 [IU] | Freq: Three times a day (TID) | INTRAMUSCULAR | Status: DC
  Administered 2022-02-10 – 2022-02-11 (×3): 3 [IU] via SUBCUTANEOUS
  Filled 2022-02-10 (×3): qty 1

## 2022-02-10 MED ORDER — LACTATED RINGERS IV BOLUS
1000.0000 mL | Freq: Once | INTRAVENOUS | Status: AC
  Administered 2022-02-10: 1000 mL via INTRAVENOUS

## 2022-02-10 MED ORDER — POLYETHYLENE GLYCOL 3350 17 G PO PACK
17.0000 g | PACK | Freq: Every day | ORAL | Status: DC
  Administered 2022-02-10 – 2022-02-11 (×2): 17 g via ORAL
  Filled 2022-02-10 (×2): qty 1

## 2022-02-10 MED ORDER — INSULIN ASPART 100 UNIT/ML IJ SOLN
8.0000 [IU] | Freq: Once | INTRAMUSCULAR | Status: AC
  Administered 2022-02-10: 8 [IU] via SUBCUTANEOUS
  Filled 2022-02-10: qty 1

## 2022-02-10 NOTE — Progress Notes (Signed)
Pt refused mobility at this time.

## 2022-02-10 NOTE — Assessment & Plan Note (Signed)
-  Continue with Rocephin - Supportive care
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
## 2022-02-10 NOTE — Inpatient Diabetes Management (Addendum)
Inpatient Diabetes Program Recommendations  AACE/ADA: New Consensus Statement on Inpatient Glycemic Control   Target Ranges:  Prepandial:   less than 140 mg/dL      Peak postprandial:   less than 180 mg/dL (1-2 hours)      Critically ill patients:  140 - 180 mg/dL    Latest Reference Range & Units 02/10/22 00:08 02/10/22 07:17 02/10/22 07:44  Glucose-Capillary 70 - 99 mg/dL 401 (H) 481 (H) 439 (H)    Latest Reference Range & Units 02/09/22 07:57 02/09/22 08:19 02/09/22 11:44 02/09/22 12:35 02/09/22 14:15 02/09/22 16:24 02/09/22 19:32 02/09/22 21:03  Glucose-Capillary 70 - 99 mg/dL 67 (L) 75 38 (LL) 131 (H) 99 176 (H) 384 (H) 493 (H)   Review of Glycemic Control  Diabetes history: DM1 (does NOT make any insulin; requires basal, correction, and carbohydrate coverage insulin) Outpatient Diabetes medications: OmniPod insulin pump with Novolog Current orders for Inpatient glycemic control: Semglee 5 units daily, Novolog 0-6 units TID with meals, Novolog  0-5 units QHS, Novolog 2 units TID with meals   Inpatient Diabetes Program Recommendations:     Insulin: Patient received Decadron 10 mg at 14:41 on 02/09/22 which is contributing to hyperglycemia. CBG 439 mg/dl this morning. Please consider increasing Semglee to 8 units daily and meal coverage to Novolog 3 units TID with meals.  NOTE: Patient had on insulin pump on morning of 02/09/22 and was NPO for surgery. Patient removed pump around 12:15 on 02/09/22. Patient received Decadron 10 mg at 14:41 on 02/09/22 which is likely cause of hyperglycemia; CBG 439 mg/dl this morning.   Thanks, Barnie Alderman, RN, MSN, [place] Diabetes Coordinator Inpatient Diabetes Program [phone] (Team Pager from 8am to [place])

## 2022-02-10 NOTE — Assessment & Plan Note (Signed)
Estimated body mass index is 17.33 kg/m as calculated from the following:   Height as of this encounter: 5\' 8"  (1.727 m).   Weight as of this encounter: 51.7 kg.   -Dietitian on board

## 2022-02-10 NOTE — Assessment & Plan Note (Signed)
Patient was on insulin pump at home.  Which she removed.  Had 1 episode of hypoglycemia as she was n.p.o. Significantly elevated CBG today with known anion gap metabolic acidosis.  Apparently received a dose of Decadron in OR yesterday.  High risk for developing DKA.  Also giving some IV fluid -Increase Semglee to 8 units daily -Renal SSI -3 units with meal if she ate more than 50%

## 2022-02-10 NOTE — Assessment & Plan Note (Signed)
No recent history of fall.  Orthopedic was consulted. S/p ORIF -Continue with pain management -PT is recommending SNF but patient would like to go home with home health.  She will discuss with daughter herself.

## 2022-02-10 NOTE — NC FL2 (Signed)
[place] LEVEL OF CARE FORM     IDENTIFICATION  Patient Name: Suzanne Snyder Birthdate: 02-01-1950 Sex: female Admission Date (Current Location): 02/08/2022  [facility] and [place] Number:  Engineering geologist and Address:  [facility], [address] [place], [place] [address]      Provider Number: [id number]  Attending Physician Name and Address:  Lorella Nimrod, MD  Relative Name and Phone Number:  Camille Bal,   [phone]    Current Level of Care: Hospital Recommended Level of Care: [place] Prior Approval Number:    Date Approved/Denied:   PASRR Number: Pending  Discharge Plan: SNF    Current Diagnoses: Patient Active Problem List   Diagnosis Date Noted   Malnutrition of moderate degree 02/10/2022   Closed right hip fracture ([place]) 02/08/2022   Hypomagnesemia 02/08/2022   Cellulitis of right ankle 02/08/2022   Hyponatremia 02/08/2022   Tobacco abuse 02/08/2022   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 ([place]) 02/08/2022   Lumbar spondylosis 11/27/2021   Lumbar degenerative disc disease 11/27/2021   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/27/2021   Chronic left shoulder pain 11/27/2021   Cervical facet joint syndrome 11/27/2021   Cervical radicular pain 11/27/2021   Chronic pain syndrome 11/27/2021   Cellulitis of right wrist 12/21/2020   Cellulitis of right thigh 12/11/2020   Type 1 diabetes mellitus with hypoglycemia without coma ([place]) 12/11/2020   Hypoglycemia 12/10/2020    Orientation RESPIRATION BLADDER Height & Weight     Self, Time, Situation, Place  Normal Continent Weight: 51.7 kg Height:  5\' 8"  (172.7 cm)  BEHAVIORAL SYMPTOMS/MOOD NEUROLOGICAL BOWEL NUTRITION STATUS  Other (Comment) (n/a)  (n/a) Continent Diet  AMBULATORY STATUS COMMUNICATION OF NEEDS Skin   Limited Assist Verbally Bruising (bilateral hands)                       Personal Care Assistance  Level of Assistance  Bathing, Dressing Bathing Assistance: Limited assistance   Dressing Assistance: Limited assistance     Functional Limitations Info  Sight Sight Info: Impaired        SPECIAL CARE FACTORS FREQUENCY  PT (By licensed PT), OT (By licensed OT)     PT Frequency: Min 2x weekly OT Frequency: Min 2x weekly            Contractures Contractures Info: Not present    Additional Factors Info  Code Status, Allergies Code Status Info: DNR Allergies Info: Cat Hair Extract, Pregabalin, Erythromycin           Current Medications (02/10/2022):  This is the current hospital active medication list Current Facility-Administered Medications  Medication Dose Route Frequency Provider Last Rate Last Admin   acetaminophen (TYLENOL) tablet 650 mg  650 mg Oral Q4H PRN Mansy, Jan A, MD       albuterol (PROVENTIL) (2.5 MG/3ML) 0.083% nebulizer solution 3 mL  3 mL Inhalation Q6H PRN Earnestine Leys, MD       alum & mag hydroxide-simeth (MAALOX/MYLANTA) 200-200-20 MG/5ML suspension 30 mL  30 mL Oral Q4H PRN Earnestine Leys, MD       bisacodyl (DULCOLAX) suppository 10 mg  10 mg Rectal Daily PRN Earnestine Leys, MD       cefTRIAXone (ROCEPHIN) 1 g in sodium chloride 0.9 % 100 mL IVPB  1 g Intravenous Q24H Lorella Nimrod, MD 200 mL/hr at 02/10/22 1706 1 g at 02/10/22 1706   celecoxib (CELEBREX) capsule 100 mg  100 mg Oral BID Earnestine Leys, MD   100 mg at 02/10/22 2215   cyclobenzaprine (FLEXERIL) tablet 10 mg  10 mg Oral TID PRN Earnestine Leys, MD   10 mg at 02/10/22 0806   enoxaparin (LOVENOX) injection 30 mg  30 mg Subcutaneous Q24H Earnestine Leys, MD   30 mg at 02/10/22 2595   ferrous sulfate tablet 325 mg  325 mg Oral Q breakfast Earnestine Leys, MD   325 mg at 02/10/22 6387   gabapentin (NEURONTIN) capsule 300 mg  300 mg Oral QID Earnestine Leys, MD   300 mg at 02/10/22 2210   HYDROcodone-acetaminophen (NORCO/VICODIN) 5-325 MG per tablet 1-2 tablet  1-2 tablet Oral Q6H PRN Mansy, Jan  A, MD   2 tablet at 02/10/22 2210   insulin aspart (novoLOG) injection 0-5 Units  0-5 Units Subcutaneous QHS Lorella Nimrod, MD   5 Units at 02/09/22 2131   insulin aspart (novoLOG) injection 0-6 Units  0-6 Units Subcutaneous TID WC Lorella Nimrod, MD   1 Units at 02/10/22 1659   insulin aspart (novoLOG) injection 3 Units  3 Units Subcutaneous TID WC Lorella Nimrod, MD   3 Units at 02/10/22 1659   insulin glargine-yfgn (SEMGLEE) injection 8 Units  8 Units Subcutaneous Daily Lorella Nimrod, MD   8 Units at 02/10/22 1037   ipratropium (ATROVENT) nebulizer solution 0.5 mg  0.5 mg Nebulization Q6H PRN Earnestine Leys, MD       magnesium hydroxide (MILK OF MAGNESIA) suspension 30 mL  30 mL Oral Daily PRN Mansy, Jan A, MD       magnesium hydroxide (MILK OF MAGNESIA) suspension 30 mL  30 mL Oral Daily PRN Earnestine Leys, MD   30 mL at 02/10/22 0519   menthol-cetylpyridinium (CEPACOL) lozenge 3 mg  1 lozenge Oral PRN Earnestine Leys, MD       Or   phenol (CHLORASEPTIC) mouth spray 1 spray  1 spray Mouth/Throat PRN Earnestine Leys, MD       metoCLOPramide (REGLAN) tablet 5-10 mg  5-10 mg Oral Q8H PRN Earnestine Leys, MD       Or   metoCLOPramide (REGLAN) injection 5-10 mg  5-10 mg Intravenous Q8H PRN Earnestine Leys, MD       morphine (PF) 2 MG/ML injection 0.5-1 mg  0.5-1 mg Intravenous Q2H PRN Earnestine Leys, MD   0.5 mg at 02/09/22 1820   multivitamin with minerals tablet 1 tablet  1 tablet Oral Daily Lorella Nimrod, MD   1 tablet at 02/10/22 0941   nicotine (NICODERM CQ - dosed in mg/24 hours) patch 21 mg  21 mg Transdermal Daily Mansy, Jan A, MD   21 mg at 02/10/22 0942   ondansetron [facility]) injection 4 mg  4 mg Intravenous Q4H PRN Mansy, Jan A, MD       polyethylene glycol (MIRALAX / GLYCOLAX) packet 17 g  17 g Oral Daily Lorella Nimrod, MD   17 g at 02/10/22 2210   rOPINIRole (REQUIP) tablet 1.5 mg  1.5 mg Oral QHS Earnestine Leys, MD   1.5 mg at 02/10/22 2210   senna (SENOKOT) tablet 8.6 mg  1 tablet Oral  BID Earnestine Leys, MD   8.6 mg at 02/10/22 2210   sodium phosphate (FLEET) 7-19 GM/118ML enema 1 enema  1 enema Rectal Once PRN Earnestine Leys, MD       traZODone (DESYREL) tablet 25 mg  25 mg Oral QHS PRN Mansy, Jan A, MD   25 mg at 02/08/22 2240   Vitamin  D (Ergocalciferol) (DRISDOL) 1.25 MG (50000 UNIT) capsule 50,000 Units  50,000 Units Oral Once per day on Mon Thu Miller, Howard, MD   50,000 Units at 02/09/22 1819   zolpidem (AMBIEN) tablet 5 mg  5 mg Oral QHS PRN Earnestine Leys, MD         Discharge Medications: Please see discharge summary for a list of discharge medications.  Relevant Imaging Results:  Relevant Lab Results:   Additional Information [id number]  Laurena Slimmer, RN

## 2022-02-10 NOTE — Progress Notes (Signed)
Subjective: 1 Day Post-Op Procedure(s) (LRB): PERCUTANEOUS FIXATION OF FEMORAL NECK (Right) Patient is alert and sitting up in bed.  She is feeling much better.  She is very verbal.  Very little pain with movement of the leg.  Dressing is dry.  She expresses gratitude for fixing her other hip 13 years ago.  She says she will be able to go home with her daughter and son-in-law to care for her.    Patient reports pain as mild.  Objective:   VITALS:   Vitals:   02/10/22 1254 02/10/22 1529  BP: (!) 127/53 (!) 141/55  Pulse: 73 83  Resp: 16 18  Temp: 98.7 F (37.1 C) 98.2 F (36.8 C)  SpO2: 100% 100%    Neurologically intact Incision: dressing C/D/I  LABS Recent Labs    02/08/22 1655 02/09/22 1931 02/10/22 0332  HGB 12.2 12.3 13.0  HCT 37.5 38.4 39.9  WBC 9.1 11.4* 11.2*  PLT 219 261 229    Recent Labs    02/08/22 1632 02/09/22 1931 02/10/22 0216 02/10/22 1554  NA 134*  --  131* 132*  K 4.1  --  4.0 4.6  BUN 14  --  18 22  CREATININE 0.70 0.67 0.79 0.86  GLUCOSE 154*  --  422* 186*    Recent Labs    02/08/22 2059  INR 1.2     Assessment/Plan: 1 Day Post-Op Procedure(s) (LRB): PERCUTANEOUS FIXATION OF FEMORAL NECK (Right)   Advance diet Up with therapy Discharge home with home health

## 2022-02-10 NOTE — Plan of Care (Signed)
  Problem: Elimination: Goal: Will not experience complications related to bowel motility Outcome: Progressing   Problem: Pain Managment: Goal: General experience of comfort will improve Outcome: Progressing   Problem: Nutrition: Goal: Adequate nutrition will be maintained Outcome: Progressing   Problem: Activity: Goal: Risk for activity intolerance will decrease Outcome: Progressing   Problem: Fluid Volume: Goal: Ability to maintain a balanced intake and output will improve Outcome: Progressing
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
## 2022-02-11 DIAGNOSIS — S72001A Fracture of unspecified part of neck of right femur, initial encounter for closed fracture: Secondary | ICD-10-CM | POA: Diagnosis not present

## 2022-02-11 LAB — CBC
HCT: 28.2 % — ABNORMAL LOW (ref 36.0–46.0)
Hemoglobin: 9.2 g/dL — ABNORMAL LOW (ref 12.0–15.0)
MCH: 29.2 pg (ref 26.0–34.0)
MCHC: 32.6 g/dL (ref 30.0–36.0)
MCV: 89.5 fL (ref 80.0–100.0)
Platelets: 215 10*3/uL (ref 150–400)
RBC: 3.15 MIL/uL — ABNORMAL LOW (ref 3.87–5.11)
RDW: 14.8 % (ref 11.5–15.5)
WBC: 7.9 10*3/uL (ref 4.0–10.5)
nRBC: 0 % (ref 0.0–0.2)

## 2022-02-11 LAB — BASIC METABOLIC PANEL
Anion gap: 9 (ref 5–15)
BUN: 18 mg/dL (ref 8–23)
CO2: 21 mmol/L — ABNORMAL LOW (ref 22–32)
Calcium: 8.1 mg/dL — ABNORMAL LOW (ref 8.9–10.3)
Chloride: 106 mmol/L (ref 98–111)
Creatinine, Ser: 0.63 mg/dL (ref 0.44–1.00)
GFR, Estimated: >60 mL/min (ref >60)
Glucose, Bld: 148 mg/dL — ABNORMAL HIGH (ref 70–99)
Potassium: 3.8 mmol/L (ref 3.5–5.1)
Sodium: 136 mmol/L (ref 135–145)

## 2022-02-11 LAB — GLUCOSE, CAPILLARY
Glucose-Capillary: 104 mg/dL — ABNORMAL HIGH (ref 70–99)
Glucose-Capillary: 106 mg/dL — ABNORMAL HIGH (ref 70–99)
Glucose-Capillary: 158 mg/dL — ABNORMAL HIGH (ref 70–99)

## 2022-02-11 MED ORDER — OXYCODONE HCL 5 MG PO TABS: 5.0000 mg | ORAL_TABLET | ORAL | 0 refills | Status: DC | PRN

## 2022-02-11 MED ORDER — ENOXAPARIN SODIUM 30 MG/0.3ML IJ SOSY: 30.0000 mg | PREFILLED_SYRINGE | INTRAMUSCULAR | 0 refills | Status: DC

## 2022-02-11 MED ORDER — POLYETHYLENE GLYCOL 3350 17 G PO PACK: 17.0000 g | PACK | Freq: Every day | ORAL | 0 refills | Status: DC

## 2022-02-11 NOTE — Progress Notes (Signed)
Subjective: 2 Days Post-Op Procedure(s) (LRB): PERCUTANEOUS FIXATION OF FEMORAL NECK (Right)   Doing well and wants to go home.  Little pain.  PWB on right.  May take 81 mg ASA instead of Lovenox.  Will RTC 2 weeks.  Patient reports pain as mild.  Objective:   VITALS:   Vitals:   02/11/22 0054 02/11/22 0749  BP: 110/83 (!) 125/56  Pulse: 64 61  Resp: 16 18  Temp: 97.8 F (36.6 C) 98.3 F (36.8 C)  SpO2: 97% 98%    Neurologically intact Incision: no drainage  LABS Recent Labs    02/09/22 1931 02/10/22 0332 02/11/22 0619  HGB 12.3 13.0 9.2*  HCT 38.4 39.9 28.2*  WBC 11.4* 11.2* 7.9  PLT 261 229 215    Recent Labs    02/10/22 0216 02/10/22 1554 02/11/22 0619  NA 131* 132* 136  K 4.0 4.6 3.8  BUN 18 22 18   CREATININE 0.79 0.86 0.63  GLUCOSE 422* 186* 148*    Recent Labs    02/08/22 2059  INR 1.2     Assessment/Plan: 2 Days Post-Op Procedure(s) (LRB): PERCUTANEOUS FIXATION OF FEMORAL NECK (Right)   Up with therapy Discharge home with home health  81 mg ASA for 6 weeks PWB right RTC 2 weeks
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
## 2022-02-11 NOTE — Progress Notes (Signed)
Met with the patient to talk about DC needs and plans She refuses to go to SNF She wants to go home with [facility] She has not used [facility] in a long time and does not have a preference I notified Jason at [employer] and set up [facility] RN, PT and OT She will get a rolling walker and 3 in 1 delivered to the bedside by [employer] Her daughter will provide transportation, her daughter lives at home with her

## 2022-02-11 NOTE — Plan of Care (Signed)
  Problem: Health Behavior/Discharge Planning: Goal: Ability to manage health-related needs will improve Outcome: Progressing   Problem: Clinical Measurements: Goal: Will remain free from infection Outcome: Progressing   Problem: Clinical Measurements: Goal: Respiratory complications will improve Outcome: Progressing   Problem: Elimination: Goal: Will not experience complications related to bowel motility Outcome: Progressing   Problem: Education: Goal: Individualized Educational Video(s) Outcome: Progressing   Problem: Coping: Goal: Ability to adjust to condition or change in health will improve Outcome: Progressing
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
## 2022-02-11 NOTE — Progress Notes (Signed)
Patient is not able to walk the distance required to go the bathroom, or he/she is unable to safely negotiate stairs required to access the bathroom.  A 3in1 BSC will alleviate this problem  

## 2022-02-11 NOTE — Care Management Important Message (Signed)
Important Message  Patient Details  Name: Suzanne Snyder MRN: [id number] Date of Birth: [birth year]   Medicare Important Message Given:  Yes     Dannette Barbara 02/11/2022, 12:10 PM

## 2022-02-13 ENCOUNTER — Emergency Department: Payer: Medicare Other

## 2022-02-13 ENCOUNTER — Inpatient Hospital Stay
Admission: EM | Admit: 2022-02-13 | Discharge: 2022-02-17 | DRG: 556 | Disposition: A | Discharge status: Skilled Nursing Facility | Payer: Medicare Other | Attending: Student | Admitting: Student

## 2022-02-13 ENCOUNTER — Other Ambulatory Visit: Payer: Self-pay

## 2022-02-13 DIAGNOSIS — M25551 Pain in right hip: Secondary | ICD-10-CM | POA: Diagnosis not present

## 2022-02-13 DIAGNOSIS — E538 Deficiency of other specified B group vitamins: Secondary | ICD-10-CM | POA: Diagnosis present

## 2022-02-13 DIAGNOSIS — Z9842 Cataract extraction status, left eye: Secondary | ICD-10-CM

## 2022-02-13 DIAGNOSIS — Z881 Allergy status to other antibiotic agents status: Secondary | ICD-10-CM

## 2022-02-13 DIAGNOSIS — Z9841 Cataract extraction status, right eye: Secondary | ICD-10-CM

## 2022-02-13 DIAGNOSIS — E162 Hypoglycemia, unspecified: Secondary | ICD-10-CM

## 2022-02-13 DIAGNOSIS — Z7951 Long term (current) use of inhaled steroids: Secondary | ICD-10-CM

## 2022-02-13 DIAGNOSIS — Z794 Long term (current) use of insulin: Secondary | ICD-10-CM

## 2022-02-13 DIAGNOSIS — G2581 Restless legs syndrome: Secondary | ICD-10-CM | POA: Diagnosis present

## 2022-02-13 DIAGNOSIS — D509 Iron deficiency anemia, unspecified: Secondary | ICD-10-CM | POA: Diagnosis present

## 2022-02-13 DIAGNOSIS — M1990 Unspecified osteoarthritis, unspecified site: Secondary | ICD-10-CM | POA: Diagnosis present

## 2022-02-13 DIAGNOSIS — Z720 Tobacco use: Secondary | ICD-10-CM | POA: Diagnosis present

## 2022-02-13 DIAGNOSIS — E1040 Type 1 diabetes mellitus with diabetic neuropathy, unspecified: Secondary | ICD-10-CM | POA: Diagnosis present

## 2022-02-13 DIAGNOSIS — E559 Vitamin D deficiency, unspecified: Secondary | ICD-10-CM | POA: Diagnosis present

## 2022-02-13 DIAGNOSIS — R509 Fever, unspecified: Secondary | ICD-10-CM | POA: Diagnosis present

## 2022-02-13 DIAGNOSIS — F17200 Nicotine dependence, unspecified, uncomplicated: Secondary | ICD-10-CM | POA: Diagnosis present

## 2022-02-13 DIAGNOSIS — G894 Chronic pain syndrome: Secondary | ICD-10-CM | POA: Diagnosis present

## 2022-02-13 DIAGNOSIS — D649 Anemia, unspecified: Secondary | ICD-10-CM | POA: Diagnosis present

## 2022-02-13 DIAGNOSIS — S72001A Fracture of unspecified part of neck of right femur, initial encounter for closed fracture: Secondary | ICD-10-CM | POA: Diagnosis present

## 2022-02-13 DIAGNOSIS — Z79899 Other long term (current) drug therapy: Secondary | ICD-10-CM

## 2022-02-13 DIAGNOSIS — E878 Other disorders of electrolyte and fluid balance, not elsewhere classified: Secondary | ICD-10-CM | POA: Diagnosis present

## 2022-02-13 DIAGNOSIS — W19XXXD Unspecified fall, subsequent encounter: Secondary | ICD-10-CM | POA: Diagnosis present

## 2022-02-13 DIAGNOSIS — Z9641 Presence of insulin pump (external) (internal): Secondary | ICD-10-CM | POA: Diagnosis present

## 2022-02-13 DIAGNOSIS — F419 Anxiety disorder, unspecified: Secondary | ICD-10-CM | POA: Diagnosis present

## 2022-02-13 DIAGNOSIS — I10 Essential (primary) hypertension: Secondary | ICD-10-CM | POA: Diagnosis present

## 2022-02-13 DIAGNOSIS — Z96642 Presence of left artificial hip joint: Secondary | ICD-10-CM | POA: Diagnosis present

## 2022-02-13 DIAGNOSIS — E1043 Type 1 diabetes mellitus with diabetic autonomic (poly)neuropathy: Secondary | ICD-10-CM

## 2022-02-13 DIAGNOSIS — E10649 Type 1 diabetes mellitus with hypoglycemia without coma: Secondary | ICD-10-CM | POA: Diagnosis present

## 2022-02-13 DIAGNOSIS — E871 Hypo-osmolality and hyponatremia: Secondary | ICD-10-CM | POA: Diagnosis present

## 2022-02-13 DIAGNOSIS — Z961 Presence of intraocular lens: Secondary | ICD-10-CM | POA: Diagnosis present

## 2022-02-13 DIAGNOSIS — S72011D Unspecified intracapsular fracture of right femur, subsequent encounter for closed fracture with routine healing: Secondary | ICD-10-CM

## 2022-02-13 DIAGNOSIS — Z888 Allergy status to other drugs, medicaments and biological substances status: Secondary | ICD-10-CM

## 2022-02-13 LAB — CBC WITH DIFFERENTIAL/PLATELET
Abs Immature Granulocytes: 0.03 10*3/uL (ref 0.00–0.07)
Basophils Absolute: 0 10*3/uL (ref 0.0–0.1)
Basophils Relative: 0 %
Eosinophils Absolute: 0.2 10*3/uL (ref 0.0–0.5)
Eosinophils Relative: 2 %
HCT: 36.4 % (ref 36.0–46.0)
Hemoglobin: 11.7 g/dL — ABNORMAL LOW (ref 12.0–15.0)
Immature Granulocytes: 0 %
Lymphocytes Relative: 22 %
Lymphs Abs: 2.4 10*3/uL (ref 0.7–4.0)
MCH: 29.7 pg (ref 26.0–34.0)
MCHC: 32.1 g/dL (ref 30.0–36.0)
MCV: 92.4 fL (ref 80.0–100.0)
Monocytes Absolute: 1.1 10*3/uL — ABNORMAL HIGH (ref 0.1–1.0)
Monocytes Relative: 10 %
Neutro Abs: 7.1 10*3/uL (ref 1.7–7.7)
Neutrophils Relative %: 66 %
Platelets: 256 10*3/uL (ref 150–400)
RBC: 3.94 MIL/uL (ref 3.87–5.11)
RDW: 14.8 % (ref 11.5–15.5)
WBC: 10.8 10*3/uL — ABNORMAL HIGH (ref 4.0–10.5)
nRBC: 0 % (ref 0.0–0.2)

## 2022-02-13 LAB — BASIC METABOLIC PANEL
Anion gap: 10 (ref 5–15)
BUN: 17 mg/dL (ref 8–23)
CO2: 23 mmol/L (ref 22–32)
Calcium: 8.8 mg/dL — ABNORMAL LOW (ref 8.9–10.3)
Chloride: 101 mmol/L (ref 98–111)
Creatinine, Ser: 0.6 mg/dL (ref 0.44–1.00)
GFR, Estimated: >60 mL/min (ref >60)
Glucose, Bld: 46 mg/dL — ABNORMAL LOW (ref 70–99)
Potassium: 4.1 mmol/L (ref 3.5–5.1)
Sodium: 134 mmol/L — ABNORMAL LOW (ref 135–145)

## 2022-02-13 LAB — CULTURE, BLOOD (ROUTINE X 2)
Culture: NO GROWTH
Culture: NO GROWTH
Special Requests: ADEQUATE
Special Requests: ADEQUATE

## 2022-02-13 LAB — CBG MONITORING, ED
Glucose-Capillary: 133 mg/dL — ABNORMAL HIGH (ref 70–99)
Glucose-Capillary: 124 mg/dL — ABNORMAL HIGH (ref 70–99)

## 2022-02-13 MED ORDER — ROPINIROLE HCL 1 MG PO TABS
1.5000 mg | ORAL_TABLET | Freq: Every day | ORAL | Status: DC
  Administered 2022-02-14 – 2022-02-16 (×4): 1.5 mg via ORAL
  Filled 2022-02-13 (×4): qty 2

## 2022-02-13 MED ORDER — OXYCODONE HCL 5 MG PO TABS
5.0000 mg | ORAL_TABLET | ORAL | Status: DC | PRN
  Filled 2022-02-13: qty 1

## 2022-02-13 MED ORDER — ALBUTEROL SULFATE (2.5 MG/3ML) 0.083% IN NEBU
3.0000 mL | INHALATION_SOLUTION | Freq: Four times a day (QID) | RESPIRATORY_TRACT | Status: DC | PRN
  Administered 2022-02-17: 3 mL via RESPIRATORY_TRACT
  Filled 2022-02-13: qty 3

## 2022-02-13 MED ORDER — HYDROCODONE-ACETAMINOPHEN 5-325 MG PO TABS
1.0000 | ORAL_TABLET | ORAL | Status: DC | PRN
  Administered 2022-02-14 – 2022-02-17 (×12): 1 via ORAL
  Filled 2022-02-13 (×12): qty 1

## 2022-02-13 MED ORDER — OXYCODONE-ACETAMINOPHEN 5-325 MG PO TABS
1.0000 | ORAL_TABLET | Freq: Once | ORAL | Status: AC
  Administered 2022-02-13: 1 via ORAL
  Filled 2022-02-13: qty 1

## 2022-02-13 MED ORDER — ACETAMINOPHEN 650 MG RE SUPP: 650.0000 mg | Freq: Four times a day (QID) | RECTAL | Status: DC | PRN

## 2022-02-13 MED ORDER — DEXTROSE 50 % IV SOLN
1.0000 | Freq: Once | INTRAVENOUS | Status: AC
  Administered 2022-02-13: 50 mL via INTRAVENOUS
  Filled 2022-02-13: qty 50

## 2022-02-13 MED ORDER — HYDROMORPHONE HCL 1 MG/ML IJ SOLN: 0.5000 mg | INTRAMUSCULAR | Status: DC | PRN

## 2022-02-13 MED ORDER — ONDANSETRON HCL 4 MG/2ML IJ SOLN
4.0000 mg | Freq: Once | INTRAMUSCULAR | Status: AC
  Administered 2022-02-13: 4 mg via INTRAVENOUS
  Filled 2022-02-13: qty 2

## 2022-02-13 MED ORDER — MORPHINE SULFATE (PF) 4 MG/ML IV SOLN
4.0000 mg | INTRAVENOUS | Status: DC | PRN
  Administered 2022-02-13 – 2022-02-14 (×2): 4 mg via INTRAVENOUS
  Filled 2022-02-13 (×2): qty 1

## 2022-02-13 MED ORDER — KETOROLAC TROMETHAMINE 30 MG/ML IJ SOLN
15.0000 mg | Freq: Once | INTRAMUSCULAR | Status: AC
  Administered 2022-02-13: 15 mg via INTRAVENOUS
  Filled 2022-02-13: qty 1

## 2022-02-13 MED ORDER — ACETAMINOPHEN 325 MG PO TABS
650.0000 mg | ORAL_TABLET | Freq: Four times a day (QID) | ORAL | Status: DC | PRN
  Filled 2022-02-13: qty 2

## 2022-02-13 MED ORDER — HEPARIN SODIUM (PORCINE) 5000 UNIT/ML IJ SOLN
5000.0000 [IU] | Freq: Three times a day (TID) | INTRAMUSCULAR | Status: DC
  Administered 2022-02-14 (×2): 5000 [IU] via SUBCUTANEOUS
  Filled 2022-02-13 (×2): qty 1

## 2022-02-13 MED ORDER — IOHEXOL 300 MG/ML  SOLN
100.0000 mL | Freq: Once | INTRAMUSCULAR | Status: AC | PRN
  Administered 2022-02-13: 100 mL via INTRAVENOUS

## 2022-02-13 MED ORDER — HYDRALAZINE HCL 20 MG/ML IJ SOLN: 5.0000 mg | INTRAMUSCULAR | Status: AC | PRN

## 2022-02-13 MED ORDER — IPRATROPIUM BROMIDE 0.02 % IN SOLN: 0.5000 mg | Freq: Four times a day (QID) | RESPIRATORY_TRACT | Status: DC | PRN

## 2022-02-13 MED ORDER — SODIUM CHLORIDE 0.9% FLUSH
3.0000 mL | Freq: Two times a day (BID) | INTRAVENOUS | Status: DC
  Administered 2022-02-14 – 2022-02-16 (×4): 3 mL via INTRAVENOUS

## 2022-02-13 NOTE — ED Triage Notes (Addendum)
Pt brought in by EMS for right hip pain. She recently had hip surgery on that side on 1/29. When she stood up to walk today, she fell and started having severe pain in her right hip. Pt given 100 mcg of fentanyl en route to ED but pain did not improve.

## 2022-02-13 NOTE — Assessment & Plan Note (Signed)
Attributer 2/2 recent sx and post op pain possibly . Prn pain control and PT.

## 2022-02-13 NOTE — Assessment & Plan Note (Signed)
Nicotine patch  

## 2022-02-13 NOTE — H&P (Incomplete)
History and Physical    Chief Complaint: Hip pain - right.   HISTORY OF PRESENT ILLNESS: Suzanne Snyder is an 72 y.o. female  seen in ed for right hip pain. Pt lives at home with her daughter. But is not able to move and looks disheveled. Per chart patient was discharged on 31 January after surgical repair of the right femoral neck fracture 29th January therapy evaluation recommended skilled nursing facility patient declined at that time and opted for home health.  At that time patient was also treated with right ankle cellulitis.  Patient has a follow-up appointment with orthopedic surgery Dr. Earnestine Leys.  Pt has  Past Medical History:  Diagnosis Date   Anxiety    Arthritis    Asthma    Diabetes mellitus without complication (HCC)    Insulin pump in place    Osteoporosis    RLS (restless legs syndrome)    Smokers' cough ([place])    Wears dentures    full upper and lower    Review of Systems  Constitutional:  Positive for fatigue.  Musculoskeletal:        Rt hip pain.   Neurological:  Positive for weakness.  All other systems reviewed and are negative.  Allergies  Allergen Reactions   Cat Hair Extract Shortness Of Breath   Pregabalin Anaphylaxis   Erythromycin Other (See Comments)    Severe stomach upset   Past Surgical History:  Procedure Laterality Date   CATARACT EXTRACTION W/PHACO Right 02/20/2020   Procedure: CATARACT EXTRACTION PHACO AND INTRAOCULAR LENS PLACEMENT (IOC) RIGHT 5.60 00:36.3;  Surgeon: Birder Robson, MD;  Location: [place];  Service: Ophthalmology;  Laterality: Right;  Diabetic - insulin pump Requests not first   CATARACT EXTRACTION W/PHACO Left 03/05/2020   Procedure: CATARACT EXTRACTION PHACO AND INTRAOCULAR LENS PLACEMENT (IOC) LEFT 4.94 00:31.6;  Surgeon: Birder Robson, MD;  Location: [place];  Service: Ophthalmology;  Laterality: Left;   HIP PINNING,CANNULATED Right 02/09/2022   Procedure: PERCUTANEOUS FIXATION  OF FEMORAL NECK;  Surgeon: Earnestine Leys, MD;  Location: [facility];  Service: Orthopedics;  Laterality: Right;   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   ROTATOR CUFF REPAIR Left 2014 and 2015   SPINE SURGERY N/A 1984   L4-5, not sure of procedure   THYROID SURGERY         MEDICATIONS: Current Outpatient Medications  Medication Instructions   acyclovir (ZOVIRAX) 400 mg, Oral, 2 times daily PRN   albuterol (VENTOLIN HFA) 108 (90 Base) MCG/ACT inhaler Inhalation, Every 6 hours PRN   ammonium lactate (LAC-HYDRIN) 12 % cream Topical, As needed, Apply to affected areas of hands and feet   clobetasol cream (TEMOVATE) 0.05 % Apply to aa's rash on the hands and elbows BID for two weeks. Avoid applying to face, groin, and axilla. Use as directed. Long-term use can cause thinning of the skin.   clonazePAM (KLONOPIN) 0.5 mg, Oral, 2 times daily PRN   cyclobenzaprine (FLEXERIL) 10 mg, Oral, 3 times daily PRN   diphenhydrAMINE HCl (BENADRYL ALLERGY PO) 50 mg, Oral, As needed   enoxaparin (LOVENOX) 30 mg, Subcutaneous, Every 24 hours   furosemide (LASIX) 20 mg, Oral, Daily PRN   gabapentin (NEURONTIN) 300 mg, Oral, 4 times daily   hydrOXYzine (ATARAX/VISTARIL) 10 MG tablet TAKE 1 OR 2 TABLET BY MOUTH THREE TIMES DAILY AS NEEDED FOR ITCH   insulin aspart (NOVOLOG) 100 UNIT/ML injection Subcutaneous, 4 times daily PRN, Sliding scale   ipratropium (  ATROVENT) 0.5 mg, Nebulization, Every 6 hours PRN   Multiple Vitamin (MULTIVITAMIN ADULT PO) Oral, 2 times daily   Omega-3 Fatty Acids (FISH OIL) 1000 MG CAPS 1.5 capsules, Oral, 2 times daily   oxyCODONE (ROXICODONE) 5 mg, Oral, Every 4 hours PRN   polyethylene glycol (MIRALAX) 17 g, Oral, Daily   rOPINIRole (REQUIP) 1.5 mg, Oral, Daily at bedtime   Saccharomyces boulardii (PROBIOTIC) 250 MG CAPS 1 capsule, Oral, 2 times daily between meals and at bedtime PRN   traMADol (ULTRAM) 50 mg, Oral, Every 12 hours PRN, Must last 30 days   triamcinolone cream  (KENALOG) 0.1 % Apply a small amount to affected area 1 to 2 times daily as needed for itchy areas on back, Avoid face, groin, and underarms. NEEDS APPT FOR FURTHER REFILLS   Vitamin D (Ergocalciferol) (DRISDOL) 50,000 Units, Oral, 2 times weekly     heparin  5,000 Units Subcutaneous Q8H   insulin aspart  0-9 Units Subcutaneous TID WC   rOPINIRole  1.5 mg Oral QHS   sodium chloride flush  3 mL Intravenous Q12H     lactated ringers      acetaminophen **OR** acetaminophen, albuterol, hydrALAZINE, HYDROcodone-acetaminophen, ipratropium, morphine injection, oxyCODONE    ED Course: Pt in Ed is crying and in pain. Vitals:   02/13/22 2009 02/13/22 2131 02/13/22 2246 02/14/22 0000  BP: (!) 178/93 (!) 148/84 (!) 180/73 (!) 175/94  Pulse: 84 90 83 84  Resp: 20 13 14 20   Temp: 98.7 F (37.1 C)   98 F (36.7 C)  SpO2: 95% 94% 97% 96%  Weight:      Height:        No intake/output data recorded. SpO2: 96 % Blood work in ed shows: Results for orders placed or performed during the hospital encounter of 02/13/22 (from the past 24 hour(s))  CBC with Differential     Status: Abnormal   Collection Time: 02/13/22  8:16 PM  Result Value Ref Range   WBC 10.8 (H) 4.0 - 10.5 K/uL   RBC 3.94 3.87 - 5.11 MIL/uL   Hemoglobin 11.7 (L) 12.0 - 15.0 g/dL   HCT 36.4 36.0 - 46.0 %   MCV 92.4 80.0 - 100.0 fL   MCH 29.7 26.0 - 34.0 pg   MCHC 32.1 30.0 - 36.0 g/dL   RDW 14.8 11.5 - 15.5 %   Platelets 256 150 - 400 K/uL   nRBC 0.0 0.0 - 0.2 %   Neutrophils Relative % 66 %   Neutro Abs 7.1 1.7 - 7.7 K/uL   Lymphocytes Relative 22 %   Lymphs Abs 2.4 0.7 - 4.0 K/uL   Monocytes Relative 10 %   Monocytes Absolute 1.1 (H) 0.1 - 1.0 K/uL   Eosinophils Relative 2 %   Eosinophils Absolute 0.2 0.0 - 0.5 K/uL   Basophils Relative 0 %   Basophils Absolute 0.0 0.0 - 0.1 K/uL   Immature Granulocytes 0 %   Abs Immature Granulocytes 0.03 0.00 - 0.07 K/uL  Basic metabolic panel     Status: Abnormal   Collection  Time: 02/13/22  8:16 PM  Result Value Ref Range   Sodium 134 (L) 135 - 145 mmol/L   Potassium 4.1 3.5 - 5.1 mmol/L   Chloride 101 98 - 111 mmol/L   CO2 23 22 - 32 mmol/L   Glucose, Bld 46 (L) 70 - 99 mg/dL   BUN 17 8 - 23 mg/dL   Creatinine, Ser 0.60 0.44 - 1.00 mg/dL  Calcium 8.8 (L) 8.9 - 10.3 mg/dL   GFR, Estimated >60 >60 mL/min   Anion gap 10 5 - 15  POC CBG, ED     Status: Abnormal   Collection Time: 02/13/22  9:51 PM  Result Value Ref Range   Glucose-Capillary 133 (H) 70 - 99 mg/dL  CBG monitoring, ED     Status: Abnormal   Collection Time: 02/13/22 11:02 PM  Result Value Ref Range   Glucose-Capillary 124 (H) 70 - 99 mg/dL    Unresulted Labs (From admission, onward)     Start     Ordered   02/14/22 0500  Comprehensive metabolic panel  Tomorrow morning,   STAT        02/13/22 2339   02/14/22 0500  CBC  Tomorrow morning,   STAT        02/13/22 2339   02/13/22 2324  Ethanol  Add-on,   AD        02/13/22 2323   02/13/22 2324  Urine Drug Screen, Qualitative ([facility] only)  Add-on,   AD        02/13/22 2323           Pt has received : Orders Placed This Encounter  Procedures   DG Hip Unilat W or Wo Pelvis 2-3 Views Right    Standing Status:   Standing    Number of Occurrences:   1    Order Specific Question:   Reason for Exam (SYMPTOM  OR DIAGNOSIS REQUIRED)    Answer:   fall with right hip pain, eval for fracture   CT ABDOMEN PELVIS W CONTRAST    Standing Status:   Standing    Number of Occurrences:   1    Order Specific Question:   Does the patient have a contrast media/X-ray dye allergy?    Answer:   No    Order Specific Question:   If indicated for the ordered procedure, I authorize the administration of contrast media per Radiology protocol    Answer:   Yes    Order Specific Question:   Is Oral Contrast requested for this exam?    Answer:   No oral contrast    Order Specific Question:   Reason for No Oral Contrast    Answer:   Medical necessity (Time  Sensitive)   US Venous Img Lower Bilateral (DVT)    Standing Status:   Standing    Number of Occurrences:   1    Order Specific Question:   Symptom/Reason for Exam    Answer:   Right hip pain [321993]   US Venous Img Lower Bilateral (DVT)    Standing Status:   Standing    Number of Occurrences:   1    Order Specific Question:   Symptom/Reason for Exam    Answer:   Right hip pain [[id number]]   CBC with Differential    Standing Status:   Standing    Number of Occurrences:   1   Basic metabolic panel    Standing Status:   Standing    Number of Occurrences:   1   Ethanol    Standing Status:   Standing    Number of Occurrences:   1   Urine Drug Screen, Qualitative ([facility] only)    Standing Status:   Standing    Number of Occurrences:   1   Comprehensive metabolic panel    Standing Status:   Standing    Number of Occurrences:  1   CBC    Standing Status:   Standing    Number of Occurrences:   1   Diet Carb Modified Fluid consistency: Thin; Room service appropriate? Yes    Standing Status:   Standing    Number of Occurrences:   1    Order Specific Question:   Diet-HS Snack?    Answer:   Nothing    Order Specific Question:   Calorie Level    Answer:   Medium 1600-2000    Order Specific Question:   Fluid consistency:    Answer:   Thin    Order Specific Question:   Room service appropriate?    Answer:   Yes   Maintain IV access    Standing Status:   Standing    Number of Occurrences:   1   Vital signs    Standing Status:   Standing    Number of Occurrences:   1   Notify physician (specify)    Standing Status:   Standing    Number of Occurrences:   55    Order Specific Question:   Notify Physician    Answer:   for pulse less than 55 or greater than 120    Order Specific Question:   Notify Physician    Answer:   for respiratory rate less than 12 or greater than 25    Order Specific Question:   Notify Physician    Answer:   for temperature greater than 100.5 F    Order Specific  Question:   Notify Physician    Answer:   for urinary output less than 30 mL/hr for four hours    Order Specific Question:   Notify Physician    Answer:   for systolic BP less than 90 or greater than 811, diastolic BP less than 60 or greater than 100    Order Specific Question:   Notify Physician    Answer:   for new hypoxia w/ oxygen saturations < 88%   Progressive Mobility Protocol: No Restrictions    Standing Status:   Standing    Number of Occurrences:   1   Daily weights    Standing Status:   Standing    Number of Occurrences:   1   Intake and Output    Standing Status:   Standing    Number of Occurrences:   1   Initiate Oral Care Protocol    Standing Status:   Standing    Number of Occurrences:   1   Initiate Carrier Fluid Protocol    Standing Status:   Standing    Number of Occurrences:   1   RN may order General Admission PRN Orders utilizing "General Admission PRN medications" (through manage orders) for the following patient needs: allergy symptoms (Claritin), cold sores (Carmex), cough (Robitussin DM), eye irritation (Liquifilm Tears), hemorrhoids (Tucks), indigestion (Maalox), minor skin irritation (Hydrocortisone Cream), muscle pain (Ben Gay), nose irritation (saline nasal spray) and sore throat (Chloraseptic spray).    Standing Status:   Standing    Number of Occurrences:   [phone]   Swallow screen    Standing Status:   Standing    Number of Occurrences:   1   Apply Diabetes Mellitus Care Plan    Standing Status:   Standing    Number of Occurrences:   1   STAT CBG when hypoglycemia is suspected. If treated, recheck every 15 minutes after each treatment until CBG >/= 70 mg/dl  Standing Status:   Standing    Number of Occurrences:   1   Refer to Hypoglycemia Protocol Sidebar Report for treatment of CBG < 70 mg/dl    Standing Status:   Standing    Number of Occurrences:   1   No HS correction Insulin    Standing Status:   Standing    Number of Occurrences:   1   Full  code    Standing Status:   Standing    Number of Occurrences:   1    Order Specific Question:   By:    Answer:   Other   Consult to hospitalist    Standing Status:   Standing    Number of Occurrences:   1    Order Specific Question:   Place call to:    Answer:   [id number]    Order Specific Question:   Reason for Consult    Answer:   Admit   Pulse oximetry check with vital signs    Standing Status:   Standing    Number of Occurrences:   1   Oxygen therapy Mode or (Route): Nasal cannula; Liters Per Minute: 2; Keep 02 saturation: greater than 92 %    Standing Status:   Standing    Number of Occurrences:   20    Order Specific Question:   Mode or (Route)    Answer:   Nasal cannula    Order Specific Question:   Liters Per Minute    Answer:   2    Order Specific Question:   Keep 02 saturation    Answer:   greater than 92 %   POC CBG, ED    Standing Status:   Standing    Number of Occurrences:   1   CBG monitoring, ED    Standing Status:   Standing    Number of Occurrences:   1   Place in observation (patient's expected length of stay will be less than 2 midnights)    Standing Status:   Standing    Number of Occurrences:   1    Order Specific Question:   Hospital Area    Answer:   [facility] [[id number]]    Order Specific Question:   Level of Care    Answer:   Med-Surg [16]    Order Specific Question:   Covid Evaluation    Answer:   Asymptomatic - no recent exposure (last 10 days) testing not required    Order Specific Question:   Diagnosis    Answer:   Right hip pain [id number]    Order Specific Question:   Admitting Physician    Answer:   Cherylann Ratel    Order Specific Question:   Attending Physician    Answer:   Cherylann Ratel   Aspiration precautions    Standing Status:   Standing    Number of Occurrences:   1   Fall precautions    Standing Status:   Standing    Number of Occurrences:   1    Meds ordered this encounter  Medications    DISCONTD: HYDROmorphone (DILAUDID) injection 0.5 mg   morphine (PF) 4 MG/ML injection 4 mg   ondansetron (ZOFRAN) injection 4 mg   dextrose 50 % solution 50 mL   iohexol (OMNIPAQUE) 300 MG/ML solution 100 mL   oxyCODONE-acetaminophen (PERCOCET/ROXICET) 5-325 MG per tablet 1 tablet   ketorolac (TORADOL) 30 MG/ML injection 15 mg  albuterol (PROVENTIL) (2.5 MG/3ML) 0.083% nebulizer solution 3 mL   rOPINIRole (REQUIP) tablet 1.5 mg   oxyCODONE (Oxy IR/ROXICODONE) immediate release tablet 5 mg   ipratropium (ATROVENT) nebulizer solution 0.5 mg   heparin injection 5,000 Units   sodium chloride flush (NS) 0.9 % injection 3 mL   OR Linked Order Group    acetaminophen (TYLENOL) tablet 650 mg    acetaminophen (TYLENOL) suppository 650 mg   HYDROcodone-acetaminophen (NORCO/VICODIN) 5-325 MG per tablet 1 tablet   hydrALAZINE (APRESOLINE) injection 5 mg   lactated ringers infusion   insulin aspart (novoLOG) injection 0-9 Units    Order Specific Question:   Correction coverage:    Answer:   Sensitive (thin, NPO, renal)    Order Specific Question:   CBG < 70:    Answer:   Implement Hypoglycemia Standing Orders and refer to Hypoglycemia Standing Orders sidebar report    Order Specific Question:   CBG 70 - 120:    Answer:   0 units    Order Specific Question:   CBG 121 - 150:    Answer:   1 unit    Order Specific Question:   CBG 151 - 200:    Answer:   2 units    Order Specific Question:   CBG 201 - 250:    Answer:   3 units    Order Specific Question:   CBG 251 - 300:    Answer:   5 units    Order Specific Question:   CBG 301 - 350:    Answer:   7 units    Order Specific Question:   CBG 351 - 400    Answer:   9 units    Order Specific Question:   CBG > 400    Answer:   call MD and obtain STAT lab verification     Admission Imaging : CT ABDOMEN PELVIS W CONTRAST  Result Date: 02/13/2022 CLINICAL DATA:  Abdominal trauma, blunt. She recently had hip surgery on that side on 1/29. When she  stood up to walk today, she fell and started having severe pain in her right hip EXAM: CT ABDOMEN AND PELVIS WITH CONTRAST TECHNIQUE: Multidetector CT imaging of the abdomen and pelvis was performed using the standard protocol following bolus administration of intravenous contrast. RADIATION DOSE REDUCTION: This exam was performed according to the departmental dose-optimization program which includes automated exposure control, adjustment of the mA and/or kV according to patient size and/or use of iterative reconstruction technique. CONTRAST:  OMNIPAQUE IOHEXOL 300 MG/ML  SOLN COMPARISON:  None Available. FINDINGS: Lower chest: No acute abnormality. Hepatobiliary: Not enlarged. No focal lesion. No laceration or subcapsular hematoma. Status post cholecystectomy.  No biliary ductal dilatation. Pancreas: Diffusely atrophic. Diffuse coarse calcifications likely sequela of chronic pancreatitis. Normal pancreatic contour. No main pancreatic duct dilatation. Spleen: Not enlarged. No focal lesion. No laceration, subcapsular hematoma, or vascular injury. Adrenals/Urinary Tract: No nodularity bilaterally. Bilateral kidneys enhance symmetrically. Subcentimeter hypodensities are too small to characterize. No hydronephrosis. No contusion, laceration, or subcapsular hematoma. No injury to the vascular structures or collecting systems. No hydroureter. The urinary bladder is unremarkable. Stomach/Bowel: Rectosigmoid surgical changes. No small or large bowel wall thickening or dilatation. The appendix is not definitely identified with no inflammatory changes in the right lower quadrant to suggest acute appendicitis. Vasculature/Lymphatics: Severe atherosclerotic plaque. No abdominal aorta or iliac aneurysm. No active contrast extravasation or pseudoaneurysm. No abdominal, pelvic, inguinal lymphadenopathy. Reproductive: Status post hysterectomy.  Pessary device noted.  Other: No simple free fluid ascites. No pneumoperitoneum. No  hemoperitoneum. No mesenteric hematoma identified. No organized fluid collection. Musculoskeletal: No significant soft tissue hematoma. No acute pelvic fracture. No spinal fracture. Interval development of a healed right sacral fracture. Three screw fixation of a subacute right femoral neck fracture-no CT findings suggest surgical hardware complication. Partially visualized dynamic intramedullary nail fixation of the left proximal femur. Stable grade 2 anterolisthesis of L3 on L4. Levoscoliosis of the lumbar spine centered at the L3-L4 level with associated multilevel intervertebral disc space vacuum phenomenon. Chronic L5 vertebral body height loss. Ports and Devices: None. IMPRESSION: 1. No acute intra-abdominal or intrapelvic traumatic injury. 2. No acute displaced fracture or traumatic listhesis of the lumbar spine. 3. Three screw fixation of a subacute right femoral neck fracture-no CT findings suggest surgical hardware complication. 4. Interval development of a healed right sacral fracture. 5. Other imaging findings of potential clinical significance: Chronic pancreatitis. Interval development of a healed right sacral fracture. Stable grade 2 anterolisthesis of L3 on L4. Aortic Atherosclerosis (ICD10-I70.0). Electronically Signed   By: Iven Finn M.D.   On: 02/13/2022 22:12   DG Hip Unilat W or Wo Pelvis 2-3 Views Right  Result Date: 02/13/2022 CLINICAL DATA:  Fall with right hip pain, evaluate for fracture EXAM: DG HIP (WITH OR WITHOUT PELVIS) 2-3V RIGHT COMPARISON:  None Available. FINDINGS: Status post ORIF for recent subcapital right femoral neck fracture with multiple screws left hip cephalomedullary nail placement. No new fracture. IMPRESSION: Status post ORIF for recent subcapital right femoral neck fracture with multiple screws. Left hip cephalomedullary nail placement. No new fracture. Electronically Signed   By: Keane Police D.O.   On: 02/13/2022 21:09      Physical Examination: Vitals:    02/13/22 2009 02/13/22 2131 02/13/22 2246 02/14/22 0000  BP: (!) 178/93 (!) 148/84 (!) 180/73 (!) 175/94  Pulse: 84 90 83 84  Temp: 98.7 F (37.1 C)   98 F (36.7 C)  Resp: 20 13 14 20   Height:      Weight:      SpO2: 95% 94% 97% 96%  BMI (Calculated):       Physical Exam Vitals and nursing note reviewed.  Constitutional:      General: She is not in acute distress.    Appearance: Normal appearance. She is not ill-appearing, toxic-appearing or diaphoretic.  HENT:     Head: Normocephalic and atraumatic.     Right Ear: Hearing and external ear normal.     Left Ear: Hearing and external ear normal.     Nose: Nose normal. No nasal deformity.     Mouth/Throat:     Lips: Pink.     Mouth: Mucous membranes are moist.     Tongue: No lesions.     Pharynx: Oropharynx is clear.  Eyes:     Extraocular Movements: Extraocular movements intact.  Cardiovascular:     Rate and Rhythm: Normal rate and regular rhythm.     Pulses:          Dorsalis pedis pulses are 1+ on the right side and 1+ on the left side.       Posterior tibial pulses are 1+ on the right side and 1+ on the left side.     Heart sounds: Normal heart sounds.  Pulmonary:     Effort: Pulmonary effort is normal.     Breath sounds: Normal breath sounds.  Abdominal:     General: Bowel sounds are normal. There is no  distension.     Palpations: Abdomen is soft. There is no mass.     Tenderness: There is no abdominal tenderness. There is no guarding.     Hernia: No hernia is present.  Musculoskeletal:     Right hip: Tenderness present. Decreased range of motion.     Right lower leg: No edema.     Left lower leg: No edema.  Skin:    General: Skin is warm.  Neurological:     General: No focal deficit present.     Mental Status: She is alert and oriented to person, place, and time.     Cranial Nerves: Cranial nerves 2-12 are intact.     Motor: Motor function is intact.  Psychiatric:        Attention and Perception:  Attention normal.        Speech: Speech normal.        Behavior: Behavior is cooperative.        Cognition and Memory: Cognition normal.    Assessment and Plan: * Hip pain, acute, right Attributer 2/2 recent sx and post op pain possibly . PRN pain control and PT.  We will however obtain venous doppler as pt is high risk for DVT.  Heparin for dvt prophylaxis.     Closed right hip fracture ([place]) S/P repair in jan 2024.  Mri and ortho consutl per am team as deemed appropriate. \   Anemia New. 2/2 post surgical . Mild and expected. We will follow. Type and screen. IV PPI.   Type 1 diabetes mellitus with hypoglycemia without coma (HCC) Glycemic protocol.  Consistent carb diet.    Electrolyte abnormality Replace and follow level.   Tobacco abuse Nicotine patch.     DVT prophylaxis:  Heparin   Code Status:  Full Code    Family Communication:  Uncertain if listed family member has passed.    Disposition Plan:  TBD.   Consults called:  None  Admission status: Observation.    Unit/ Expected LOS: Med tele / 1-2 days.     Para Skeans MD [facility]  6 PM- 2 AM. Please contact me via secure Chat 6 PM-2 AM. [phone]( Pager ) To contact the [facility] Attending or Consulting provider [place] or covering provider during after hours [place], for this patient.   Check the care team in [facility] and look for a) attending/consulting [facility] provider listed and b) the [facility] team listed Log into [email or web address] and use [place]'s universal password to access. If you do not have the password, please contact the hospital operator. Locate the [facility] provider you are looking for under [facility] and page to a number that you can be directly reached. If you still have difficulty reaching the provider, please page the [facility] (Director on Call) for the Hospitalists listed on amion for assistance. [email or web address] 02/14/2022, 12:58 AM

## 2022-02-13 NOTE — ED Provider Notes (Signed)
[facility] Provider Note    Event Date/Time   First MD Initiated Contact with Patient 02/13/22 2002     (approximate)   History   Hip Pain   HPI  Suzanne Snyder is a 72 y.o. female extensive past medical history including chronic pain syndrome status post recent right hip repair presents to the ER for acute right hip pain that occurred after she stood up.  Felt sudden onset severe pain discomfort.  Unable to bear weight.  Did not fall.  Did not hit her head.  Denies any other injury or pain.     Physical Exam   Triage Vital Signs: ED Triage Vitals  Enc Vitals Group     BP 02/13/22 2009 (!) 178/93     Pulse Rate 02/13/22 2009 84     Resp 02/13/22 2009 20     Temp 02/13/22 2009 98.7 F (37.1 C)     Temp src --      SpO2 02/13/22 2009 95 %     Weight 02/13/22 2006 114 lb (51.7 kg)     Height 02/13/22 2006 5\' 8"  (1.727 m)     Head Circumference --      Peak Flow --      Pain Score 02/13/22 2005 0     Pain Loc --      Pain Edu? --      Excl. in [place]? --     Most recent vital signs: Vitals:   02/13/22 2131 02/13/22 2246  BP: (!) 148/84 (!) 180/73  Pulse: 90 83  Resp: 13 14  Temp:    SpO2: 94% 97%     Constitutional: Alert  Eyes: Conjunctivae are normal.  Head: Atraumatic. Nose: No congestion/rhinnorhea. Mouth/Throat: Mucous membranes are moist.   Neck: Painless ROM.  Cardiovascular:   Good peripheral circulation. Respiratory: Normal respiratory effort.  No retractions.  Gastrointestinal: Soft and nontender.  Musculoskeletal:  no deformity  n/v intact distally, compartemnts soft.  Surgical site appears cdi,  Neurologic:  MAE spontaneously. No gross focal neurologic deficits are appreciated.  Skin:  Skin is warm, dry and intact. No rash noted. Psychiatric: Mood and affect are normal. Speech and behavior are normal.    ED Results / Procedures / Treatments   Labs (all labs ordered are listed, but only abnormal results are  displayed) Labs Reviewed  CBC WITH DIFFERENTIAL/PLATELET - Abnormal; Notable for the following components:      Result Value   WBC 10.8 (*)    Hemoglobin 11.7 (*)    Monocytes Absolute 1.1 (*)    All other components within normal limits  BASIC METABOLIC PANEL - Abnormal; Notable for the following components:   Sodium 134 (*)    Glucose, Bld 46 (*)    Calcium 8.8 (*)    All other components within normal limits  CBG MONITORING, ED - Abnormal; Notable for the following components:   Glucose-Capillary 133 (*)    All other components within normal limits  CBG MONITORING, ED - Abnormal; Notable for the following components:   Glucose-Capillary 124 (*)    All other components within normal limits     EKG    RADIOLOGY Please see ED Course for my review and interpretation.  I personally reviewed all radiographic images ordered to evaluate for the above acute complaints and reviewed radiology reports and findings.  These findings were personally discussed with the patient.  Please see medical record for radiology report.    PROCEDURES:  Critical Care performed:   Procedures   MEDICATIONS ORDERED IN ED: Medications  morphine (PF) 4 MG/ML injection 4 mg (4 mg Intravenous Given 02/13/22 2020)  ondansetron (ZOFRAN) injection 4 mg (4 mg Intravenous Given 02/13/22 2019)  dextrose 50 % solution 50 mL (50 mLs Intravenous Given 02/13/22 2104)  iohexol (OMNIPAQUE) 300 MG/ML solution 100 mL (100 mLs Intravenous Contrast Given 02/13/22 2139)  oxyCODONE-acetaminophen (PERCOCET/ROXICET) 5-325 MG per tablet 1 tablet (1 tablet Oral Given 02/13/22 2226)  ketorolac (TORADOL) 30 MG/ML injection 15 mg (15 mg Intravenous Given 02/13/22 2226)     IMPRESSION / MDM / ASSESSMENT AND PLAN / ED COURSE  I reviewed the triage vital signs and the nursing notes.                              Differential diagnosis includes, but is not limited to, fracture, contusion, dislocation, arthritis, ligamentous injury,     Patient presenting to the ER for evaluation of symptoms as described above.  Based on symptoms, risk factors and considered above differential, this presenting complaint could reflect a potentially life-threatening illness therefore the patient will be placed on continuous pulse oximetry and telemetry for monitoring.  Laboratory evaluation will be sent to evaluate for the above complaints.  Ridging will be ordered for the blood differential.  Will give IV morphine for pain.  Clinical Course as of 02/13/22 2320  Fri Feb 13, 2022  2100 Patient noted to be hypoglycemic.  Will order D50. [PR]  2128 X-ray my review and interpretation without evidence of fracture or dislocation.  Patient having severe pain in the right lower quadrant.  She is neurovascular intact distally.  Strong peripheral pulses.  Doubt vascular etiology not consistent with ischemia, compartments remain soft.  No fullness or pain in the gluteal compartment.  Will order CT imaging to evaluate for possible intrapelvic injury. [PR]  2152 CT imaging on my review and interpretation does not show any large hematoma or fluid collection will await formal radiology report. [PR]  2310 Patient still having quite a bit of pain.  Does not seem consistent with a septic arthritis.  Suspect muscular injury possible worsening of postop pain given subacute fracture.  Patient unable to bear any weight.  Does not sound like she has a safe disposition previous had been recommended for SNF postop but she declined this.  Patient agrees that she can go home at this time.  She is received several doses of IV narcotic pain medication and oral narcotic pain medication without adequate analgesia.  Given her age and risk factors will consult hospitalist for pain control PT eval as well possible placement. [PR]    Clinical Course User Index [PR] Merlyn Lot, MD   FINAL CLINICAL IMPRESSION(S) / ED DIAGNOSES   Final diagnoses:  Right hip pain  Hypoglycemia      Rx / DC Orders   ED Discharge Orders     None        Note:  This document was prepared using Dragon voice recognition software and may include unintentional dictation errors.    Merlyn Lot, MD 02/13/22 [phone]

## 2022-02-13 NOTE — Assessment & Plan Note (Signed)
S/P repair in jan 2024.  Mri and ortho consutl per am team as deemed appropriate. \

## 2022-02-13 NOTE — Assessment & Plan Note (Signed)
New. 2/2 post surgical . Mild and expected. We will follow. Type and screen. IV PPI.

## 2022-02-13 NOTE — Assessment & Plan Note (Signed)
Glycemic protocol.  Consistent carb diet.

## 2022-02-13 NOTE — Assessment & Plan Note (Signed)
Replace and follow level. ? ?

## 2022-02-14 ENCOUNTER — Observation Stay: Payer: Medicare Other

## 2022-02-14 DIAGNOSIS — E538 Deficiency of other specified B group vitamins: Secondary | ICD-10-CM | POA: Diagnosis present

## 2022-02-14 DIAGNOSIS — F17200 Nicotine dependence, unspecified, uncomplicated: Secondary | ICD-10-CM | POA: Diagnosis present

## 2022-02-14 DIAGNOSIS — W19XXXD Unspecified fall, subsequent encounter: Secondary | ICD-10-CM | POA: Diagnosis present

## 2022-02-14 DIAGNOSIS — S72011D Unspecified intracapsular fracture of right femur, subsequent encounter for closed fracture with routine healing: Secondary | ICD-10-CM | POA: Diagnosis not present

## 2022-02-14 DIAGNOSIS — R509 Fever, unspecified: Secondary | ICD-10-CM | POA: Diagnosis present

## 2022-02-14 DIAGNOSIS — Z794 Long term (current) use of insulin: Secondary | ICD-10-CM | POA: Diagnosis not present

## 2022-02-14 DIAGNOSIS — Z881 Allergy status to other antibiotic agents status: Secondary | ICD-10-CM | POA: Diagnosis not present

## 2022-02-14 DIAGNOSIS — Z79899 Other long term (current) drug therapy: Secondary | ICD-10-CM | POA: Diagnosis not present

## 2022-02-14 DIAGNOSIS — Z9641 Presence of insulin pump (external) (internal): Secondary | ICD-10-CM | POA: Diagnosis present

## 2022-02-14 DIAGNOSIS — E559 Vitamin D deficiency, unspecified: Secondary | ICD-10-CM | POA: Diagnosis present

## 2022-02-14 DIAGNOSIS — I10 Essential (primary) hypertension: Secondary | ICD-10-CM | POA: Diagnosis present

## 2022-02-14 DIAGNOSIS — M25551 Pain in right hip: Principal | ICD-10-CM | POA: Diagnosis present

## 2022-02-14 DIAGNOSIS — G2581 Restless legs syndrome: Secondary | ICD-10-CM | POA: Diagnosis present

## 2022-02-14 DIAGNOSIS — E871 Hypo-osmolality and hyponatremia: Secondary | ICD-10-CM | POA: Diagnosis present

## 2022-02-14 DIAGNOSIS — E1040 Type 1 diabetes mellitus with diabetic neuropathy, unspecified: Secondary | ICD-10-CM | POA: Diagnosis present

## 2022-02-14 DIAGNOSIS — E878 Other disorders of electrolyte and fluid balance, not elsewhere classified: Secondary | ICD-10-CM | POA: Diagnosis present

## 2022-02-14 DIAGNOSIS — Z888 Allergy status to other drugs, medicaments and biological substances status: Secondary | ICD-10-CM | POA: Diagnosis not present

## 2022-02-14 DIAGNOSIS — M1990 Unspecified osteoarthritis, unspecified site: Secondary | ICD-10-CM | POA: Diagnosis present

## 2022-02-14 DIAGNOSIS — F419 Anxiety disorder, unspecified: Secondary | ICD-10-CM | POA: Diagnosis present

## 2022-02-14 DIAGNOSIS — E162 Hypoglycemia, unspecified: Secondary | ICD-10-CM | POA: Diagnosis present

## 2022-02-14 DIAGNOSIS — E10649 Type 1 diabetes mellitus with hypoglycemia without coma: Secondary | ICD-10-CM | POA: Diagnosis present

## 2022-02-14 DIAGNOSIS — G894 Chronic pain syndrome: Secondary | ICD-10-CM | POA: Diagnosis present

## 2022-02-14 DIAGNOSIS — Z961 Presence of intraocular lens: Secondary | ICD-10-CM | POA: Diagnosis present

## 2022-02-14 DIAGNOSIS — D509 Iron deficiency anemia, unspecified: Secondary | ICD-10-CM | POA: Diagnosis present

## 2022-02-14 LAB — GLUCOSE, CAPILLARY
Glucose-Capillary: 150 mg/dL — ABNORMAL HIGH (ref 70–99)
Glucose-Capillary: 127 mg/dL — ABNORMAL HIGH (ref 70–99)
Glucose-Capillary: 39 mg/dL — CL (ref 70–99)
Glucose-Capillary: 71 mg/dL (ref 70–99)
Glucose-Capillary: 95 mg/dL (ref 70–99)
Glucose-Capillary: 306 mg/dL — ABNORMAL HIGH (ref 70–99)
Glucose-Capillary: 123 mg/dL — ABNORMAL HIGH (ref 70–99)

## 2022-02-14 LAB — CBC
HCT: 35.3 % — ABNORMAL LOW (ref 36.0–46.0)
Hemoglobin: 11.5 g/dL — ABNORMAL LOW (ref 12.0–15.0)
MCH: 29.3 pg (ref 26.0–34.0)
MCHC: 32.6 g/dL (ref 30.0–36.0)
MCV: 90.1 fL (ref 80.0–100.0)
Platelets: 255 10*3/uL (ref 150–400)
RBC: 3.92 MIL/uL (ref 3.87–5.11)
RDW: 14.6 % (ref 11.5–15.5)
WBC: 9 10*3/uL (ref 4.0–10.5)
nRBC: 0 % (ref 0.0–0.2)

## 2022-02-14 LAB — COMPREHENSIVE METABOLIC PANEL
ALT: 16 U/L (ref 0–44)
AST: 19 U/L (ref 15–41)
Albumin: 3.2 g/dL — ABNORMAL LOW (ref 3.5–5.0)
Alkaline Phosphatase: 93 U/L (ref 38–126)
Anion gap: 10 (ref 5–15)
BUN: 15 mg/dL (ref 8–23)
CO2: 22 mmol/L (ref 22–32)
Calcium: 8.1 mg/dL — ABNORMAL LOW (ref 8.9–10.3)
Chloride: 100 mmol/L (ref 98–111)
Creatinine, Ser: 0.59 mg/dL (ref 0.44–1.00)
GFR, Estimated: >60 mL/min (ref >60)
Glucose, Bld: 126 mg/dL — ABNORMAL HIGH (ref 70–99)
Potassium: 3.8 mmol/L (ref 3.5–5.1)
Sodium: 132 mmol/L — ABNORMAL LOW (ref 135–145)
Total Bilirubin: 0.7 mg/dL (ref 0.3–1.2)
Total Protein: 6.1 g/dL — ABNORMAL LOW (ref 6.5–8.1)

## 2022-02-14 LAB — VITAMIN B12: Vitamin B-12: 190 pg/mL (ref 180–914)

## 2022-02-14 LAB — IRON AND TIBC
Iron: 19 ug/dL — ABNORMAL LOW (ref 28–170)
Saturation Ratios: 6 % — ABNORMAL LOW (ref 10.4–31.8)
TIBC: 321 ug/dL (ref 250–450)
UIBC: 302 ug/dL

## 2022-02-14 LAB — FOLATE: Folate: 33 ng/mL (ref >5.9)

## 2022-02-14 LAB — PHOSPHORUS: Phosphorus: 3.7 mg/dL (ref 2.5–4.6)

## 2022-02-14 LAB — MAGNESIUM: Magnesium: 1.9 mg/dL (ref 1.7–2.4)

## 2022-02-14 LAB — ETHANOL: Alcohol, Ethyl (B): <10 mg/dL (ref <10)

## 2022-02-14 MED ORDER — SODIUM CHLORIDE 0.9 % IV SOLN
200.0000 mg | INTRAVENOUS | Status: DC
  Administered 2022-02-14 – 2022-02-16 (×3): 200 mg via INTRAVENOUS
  Filled 2022-02-14: qty 10
  Filled 2022-02-14 (×3): qty 200

## 2022-02-14 MED ORDER — POLYVINYL ALCOHOL 1.4 % OP SOLN: 1.0000 [drp] | OPHTHALMIC | Status: DC | PRN

## 2022-02-14 MED ORDER — MORPHINE SULFATE (PF) 2 MG/ML IV SOLN
2.0000 mg | INTRAVENOUS | Status: DC | PRN
  Administered 2022-02-14 – 2022-02-17 (×7): 2 mg via INTRAVENOUS
  Filled 2022-02-14 (×7): qty 1

## 2022-02-14 MED ORDER — NICOTINE 21 MG/24HR TD PT24
21.0000 mg | MEDICATED_PATCH | Freq: Every day | TRANSDERMAL | Status: DC
  Administered 2022-02-14 – 2022-02-17 (×4): 21 mg via TRANSDERMAL
  Filled 2022-02-14 (×4): qty 1

## 2022-02-14 MED ORDER — ENOXAPARIN SODIUM 40 MG/0.4ML IJ SOSY
40.0000 mg | PREFILLED_SYRINGE | Freq: Every evening | INTRAMUSCULAR | Status: DC
  Administered 2022-02-14 – 2022-02-16 (×3): 40 mg via SUBCUTANEOUS
  Filled 2022-02-14 (×3): qty 0.4

## 2022-02-14 MED ORDER — FERROUS SULFATE 325 (65 FE) MG PO TABS
325.0000 mg | ORAL_TABLET | Freq: Every day | ORAL | Status: DC
  Administered 2022-02-14: 325 mg via ORAL
  Filled 2022-02-14: qty 1

## 2022-02-14 MED ORDER — POLYETHYLENE GLYCOL 3350 17 G PO PACK
17.0000 g | PACK | Freq: Every day | ORAL | Status: DC
  Administered 2022-02-14 – 2022-02-17 (×4): 17 g via ORAL
  Filled 2022-02-14 (×4): qty 1

## 2022-02-14 MED ORDER — GABAPENTIN 300 MG PO CAPS
300.0000 mg | ORAL_CAPSULE | Freq: Four times a day (QID) | ORAL | Status: DC
  Administered 2022-02-14 – 2022-02-17 (×13): 300 mg via ORAL
  Filled 2022-02-14 (×13): qty 1

## 2022-02-14 MED ORDER — CLONAZEPAM 0.5 MG PO TABS
0.5000 mg | ORAL_TABLET | Freq: Two times a day (BID) | ORAL | Status: DC | PRN
  Administered 2022-02-14 – 2022-02-17 (×5): 0.5 mg via ORAL
  Filled 2022-02-14 (×5): qty 1

## 2022-02-14 MED ORDER — INSULIN ASPART 100 UNIT/ML IJ SOLN
0.0000 [IU] | Freq: Three times a day (TID) | INTRAMUSCULAR | Status: DC
  Administered 2022-02-14: 7 [IU] via SUBCUTANEOUS
  Administered 2022-02-14: 1 [IU] via SUBCUTANEOUS
  Administered 2022-02-15: 3 [IU] via SUBCUTANEOUS
  Administered 2022-02-15: 5 [IU] via SUBCUTANEOUS
  Administered 2022-02-15: 9 [IU] via SUBCUTANEOUS
  Administered 2022-02-16: 3 [IU] via SUBCUTANEOUS
  Administered 2022-02-16: 5 [IU] via SUBCUTANEOUS
  Administered 2022-02-16: 7 [IU] via SUBCUTANEOUS
  Administered 2022-02-17 (×2): 5 [IU] via SUBCUTANEOUS
  Filled 2022-02-14 (×10): qty 1

## 2022-02-14 MED ORDER — CYANOCOBALAMIN 1000 MCG/ML IJ SOLN
1000.0000 ug | Freq: Every day | INTRAMUSCULAR | Status: DC
  Administered 2022-02-14 – 2022-02-17 (×4): 1000 ug via INTRAMUSCULAR
  Filled 2022-02-14 (×4): qty 1

## 2022-02-14 MED ORDER — LACTATED RINGERS IV SOLN: INTRAVENOUS | Status: DC

## 2022-02-14 MED ORDER — VITAMIN B-12 1000 MCG PO TABS: 1000.0000 ug | ORAL_TABLET | Freq: Every day | ORAL | Status: DC
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
## 2022-02-14 NOTE — Progress Notes (Signed)
[facility] Progress Note  Patient: Suzanne Snyder    RWE:[id number]  DOA: 02/13/2022     Date of Service: the patient was seen and examined on 02/14/2022  Chief Complaint  Patient presents with   Hip Pain   Brief hospital course: Suzanne Snyder is a 72 y.o. female with PMH of IDDM T2, anxiety, chronic pain, s/p right hip ORIF done on 02/09/22, PT and OT recommended SNF placement but patient declined.  Patient was discharged home.  Patient returned back to [facility] ED on 224 due to worsening of pain in the right lower extremity, unable to bear weight and move.  [name] is unable to take care of her.  Patient is being admitted for pain management and reevaluation by PT and OT for possible SNF placement.    Assessment and Plan:  Right hip pain s/p right hip ORIF done on 02/09/2022 Continue fall precautions Continue PRN medication for pain control Lower extremity venous duplex negative for DVT Ambulate with assistance, turn patient every 2 hourly Follow PT and OT eval   IDDM T2 Held home regimen for now Continue NovoLog sliding scale Continue diabetic diet, monitor FSBG Neuropathy, continued gabapentin home dose  Nicotine dependence, started nicotine patch Smoking cessation counseling done.   Restless leg syndrome, continue Requip 1.5 mg nightly, home dose  Hyponatremia, continue to monitor Na 132 Repeat BMP tomorrow a.m.   Iron deficiency anemia, iron level 19, transferrin saturation 6% Started Venofer 200 mg IV daily for 5 days, followed by oral iron supplement. Folate level within normal range Follow B12 and vitamin D level   Anxiety disorder Resumed Klonopin 0.5 mg twice daily as needed home dose  Body mass index is 17.1 kg/m.  Interventions:       Diet: Carb modified diet DVT Prophylaxis: Subcutaneous Lovenox   Advance goals of care discussion: Full code  Family Communication: family was present at bedside, at the time of interview.  The pt  provided permission to discuss medical plan with the family. Opportunity was given to ask question and all questions were answered satisfactorily.   Disposition:  Pt is from Home, admitted with right lower extremity pain status post ORIF, still difficulty ambulation, will need placement, pending PT/OT eval, which precludes a safe discharge. Discharge to SNF, when bed will be available.  Subjective: No significant overnight events, patient is having pain in right lower extremity, unable to bear weight and ambulate, feels significant weakness.  At rest pain is controlled, no tenderness.  Denied any chest pain or palpitation, no shortness of breath.  Physical Exam: General: NAD, lying comfortably Appear in no distress, affect appropriate Eyes: PERRLA ENT: Oral Mucosa Clear, moist  Neck: no JVD,  Cardiovascular: S1 and S2 Present, no Murmur,  Respiratory: good respiratory effort, Bilateral Air entry equal and Decreased, no Crackles, no wheezes Abdomen: Bowel Sound present, Soft and no tenderness,  Skin: no rashes Extremities: no Pedal edema, no calf tenderness, Rt thigh stitches intact, no tenderness, mild erythema, no signs of infection. Neurologic: without any new focal findings Gait not checked due to patient safety concerns  Vitals:   02/14/22 0300 02/14/22 0351 02/14/22 0448 02/14/22 0747  BP: (!) 162/67 (!) 138/59  (!) 128/57  Pulse: 64 64  64  Resp: (!) 23 16  16   Temp:  98.2 F (36.8 C)  98.2 F (36.8 C)  TempSrc:    Oral  SpO2: 100% 98%  97%  Weight:   51 kg   Height:  Intake/Output Summary (Last 24 hours) at 02/14/2022 1159 Last data filed at 02/14/2022 1607 Gross per 24 hour  Intake 322.14 ml  Output --  Net 322.14 ml   Filed Weights   02/13/22 2006 02/14/22 0448  Weight: 51.7 kg 51 kg    Data Reviewed: I have personally reviewed and interpreted daily labs, tele strips, imagings as discussed above. I reviewed all nursing notes, pharmacy notes, vitals,  pertinent old records I have discussed plan of care as described above with RN and patient/family.  CBC: Recent Labs  Lab 02/09/22 1931 02/10/22 0332 02/11/22 0619 02/13/22 2016 02/14/22 0452  WBC 11.4* 11.2* 7.9 10.8* 9.0  NEUTROABS  --   --   --  7.1  --   HGB 12.3 13.0 9.2* 11.7* 11.5*  HCT 38.4 39.9 28.2* 36.4 35.3*  MCV 92.1 90.3 89.5 92.4 90.1  PLT 261 229 215 256 255   Basic Metabolic Panel: Recent Labs  Lab 02/08/22 1632 02/09/22 1931 02/10/22 0216 02/10/22 1554 02/11/22 0619 02/13/22 2016 02/14/22 0452 02/14/22 0750  NA 134*  --  131* 132* 136 134* 132*  --   K 4.1  --  4.0 4.6 3.8 4.1 3.8  --   CL 104  --  102 99 106 101 100  --   CO2 23  --  19* 22 21* 23 22  --   GLUCOSE 154*  --  422* 186* 148* 46* 126*  --   BUN 14  --  18 22 18 17 15   --   CREATININE 0.70   < > 0.79 0.86 0.63 0.60 0.59  --   CALCIUM 8.4*  --  8.0* 9.0 8.1* 8.8* 8.1*  --   MG 1.6*  --   --   --   --   --   --  1.9  PHOS  --   --   --   --   --   --   --  3.7   < > = values in this interval not displayed.    Studies: Venous Img Lower Bilateral (DVT)  Result Date: 02/14/2022 CLINICAL DATA:  Hip pain and recent internal fixation. Color changes with varicose veins. Anticoagulation therapy. EXAM: Bilateral LOWER EXTREMITY VENOUS DOPPLER ULTRASOUND TECHNIQUE: Gray-scale sonography with compression, as well as color and duplex ultrasound, were performed to evaluate the deep venous system(s) from the level of the common femoral vein through the popliteal and proximal calf veins. COMPARISON:  None Available. FINDINGS: VENOUS Normal compressibility of the common femoral, superficial femoral, and popliteal veins, as well as the visualized calf veins. Visualized portions of profunda femoral vein and great saphenous vein unremarkable. No filling defects to suggest DVT on grayscale or color Doppler imaging. Doppler waveforms show normal direction of venous flow, normal respiratory plasticity and  response to augmentation. OTHER None. Limitations: none IMPRESSION: No evidence of acute deep venous thrombosis in the visualized lower extremity veins. Electronically Signed   By: 04/15/2022 M.D.   On: 02/14/2022 01:18   CT ABDOMEN PELVIS W CONTRAST  Result Date: 02/13/2022 CLINICAL DATA:  Abdominal trauma, blunt. She recently had hip surgery on that side on 1/29. When she stood up to walk today, she fell and started having severe pain in her right hip EXAM: CT ABDOMEN AND PELVIS WITH CONTRAST TECHNIQUE: Multidetector CT imaging of the abdomen and pelvis was performed using the standard protocol following bolus administration of intravenous contrast. RADIATION DOSE REDUCTION: This exam was performed according to the  departmental dose-optimization program which includes automated exposure control, adjustment of the mA and/or kV according to patient size and/or use of iterative reconstruction technique. CONTRAST:  162mL OMNIPAQUE IOHEXOL 300 MG/ML  SOLN COMPARISON:  None Available. FINDINGS: Lower chest: No acute abnormality. Hepatobiliary: Not enlarged. No focal lesion. No laceration or subcapsular hematoma. Status post cholecystectomy.  No biliary ductal dilatation. Pancreas: Diffusely atrophic. Diffuse coarse calcifications likely sequela of chronic pancreatitis. Normal pancreatic contour. No main pancreatic duct dilatation. Spleen: Not enlarged. No focal lesion. No laceration, subcapsular hematoma, or vascular injury. Adrenals/Urinary Tract: No nodularity bilaterally. Bilateral kidneys enhance symmetrically. Subcentimeter hypodensities are too small to characterize. No hydronephrosis. No contusion, laceration, or subcapsular hematoma. No injury to the vascular structures or collecting systems. No hydroureter. The urinary bladder is unremarkable. Stomach/Bowel: Rectosigmoid surgical changes. No small or large bowel wall thickening or dilatation. The appendix is not definitely identified with no  inflammatory changes in the right lower quadrant to suggest acute appendicitis. Vasculature/Lymphatics: Severe atherosclerotic plaque. No abdominal aorta or iliac aneurysm. No active contrast extravasation or pseudoaneurysm. No abdominal, pelvic, inguinal lymphadenopathy. Reproductive: Status post hysterectomy.  Pessary device noted. Other: No simple free fluid ascites. No pneumoperitoneum. No hemoperitoneum. No mesenteric hematoma identified. No organized fluid collection. Musculoskeletal: No significant soft tissue hematoma. No acute pelvic fracture. No spinal fracture. Interval development of a healed right sacral fracture. Three screw fixation of a subacute right femoral neck fracture-no CT findings suggest surgical hardware complication. Partially visualized dynamic intramedullary nail fixation of the left proximal femur. Stable grade 2 anterolisthesis of L3 on L4. Levoscoliosis of the lumbar spine centered at the L3-L4 level with associated multilevel intervertebral disc space vacuum phenomenon. Chronic L5 vertebral body height loss. Ports and Devices: None. IMPRESSION: 1. No acute intra-abdominal or intrapelvic traumatic injury. 2. No acute displaced fracture or traumatic listhesis of the lumbar spine. 3. Three screw fixation of a subacute right femoral neck fracture-no CT findings suggest surgical hardware complication. 4. Interval development of a healed right sacral fracture. 5. Other imaging findings of potential clinical significance: Chronic pancreatitis. Interval development of a healed right sacral fracture. Stable grade 2 anterolisthesis of L3 on L4. Aortic Atherosclerosis (ICD10-I70.0). Electronically Signed   By: Iven Finn M.D.   On: 02/13/2022 22:12   DG Hip Unilat W or Wo Pelvis 2-3 Views Right  Result Date: 02/13/2022 CLINICAL DATA:  Fall with right hip pain, evaluate for fracture EXAM: DG HIP (WITH OR WITHOUT PELVIS) 2-3V RIGHT COMPARISON:  None Available. FINDINGS: Status post ORIF  for recent subcapital right femoral neck fracture with multiple screws left hip cephalomedullary nail placement. No new fracture. IMPRESSION: Status post ORIF for recent subcapital right femoral neck fracture with multiple screws. Left hip cephalomedullary nail placement. No new fracture. Electronically Signed   By: Keane Police D.O.   On: 02/13/2022 21:09    Scheduled Meds:  ferrous sulfate  325 mg Oral Q breakfast   gabapentin  300 mg Oral QID   heparin  5,000 Units Subcutaneous Q8H   insulin aspart  0-9 Units Subcutaneous TID WC   nicotine  21 mg Transdermal Daily   polyethylene glycol  17 g Oral Daily   rOPINIRole  1.5 mg Oral QHS   sodium chloride flush  3 mL Intravenous Q12H   Continuous Infusions:  lactated ringers 75 mL/hr at 02/14/22 0613   PRN Meds: acetaminophen **OR** acetaminophen, albuterol, clonazePAM, hydrALAZINE, HYDROcodone-acetaminophen, ipratropium, morphine injection  Time spent: 35 minutes  Author: Val Riles. MD [facility]  02/14/2022 11:59 AM  To reach On-call, see care teams to locate the attending and reach out to them via [email or web address]. If 7PM-7AM, please contact night-coverage If you still have difficulty reaching the attending provider, please page the [facility] (Director on Call) for [facility] on amion for assistance.

## 2022-02-14 NOTE — Evaluation (Signed)
Physical Therapy Evaluation Patient Details Name: Suzanne Snyder MRN: [id number] DOB: 12/01/1950 Today's Date: 02/14/2022  History of Present Illness  72 y.o. female  seen in ed for right hip pain.  Pt lives at home with her daughter. But is not able to move and looks disheveled.  Per chart patient was discharged on 31 January after surgical repair of the right femoral neck fracture 29th January therapy evaluation recommended skilled nursing facility patient declined at that time and opted for home health.  At that time patient was also treated with right ankle cellulitis.  Patient has a follow-up appointment with orthopedic surgery Dr. Earnestine Leys.  Clinical Impression  Pt is a pleasant 72 year old female who was admitted for R hip pain. Pt performs bed mobility/transfers with mod assist and unable to ambulate at this time secondary to pain. Pt demonstrates deficits with strength/pain/mobility. Pt reports no falls, however based on physical assessment would anticipate difficulty returning home. Currently not at baseline level. Would benefit from skilled PT to address above deficits and promote optimal return to PLOF; recommend transition to STR upon discharge from acute hospitalization.      Recommendations for follow up therapy are one component of a multi-disciplinary discharge planning process, led by the attending physician.  Recommendations may be updated based on patient status, additional functional criteria and insurance authorization.  Follow Up Recommendations Skilled nursing-short term rehab (<3 hours/day) Can patient physically be transported by private vehicle: Yes    Assistance Recommended at Discharge Intermittent Supervision/Assistance  Patient can return home with the following  A little help with walking and/or transfers;Assist for transportation;Assistance with cooking/housework;A little help with bathing/dressing/bathroom;Help with stairs or ramp for entrance     Equipment Recommendations None recommended by PT  Recommendations for Other Services       Functional Status Assessment Patient has had a recent decline in their functional status and demonstrates the ability to make significant improvements in function in a reasonable and predictable amount of time.     Precautions / Restrictions Precautions Precautions: Fall Restrictions Weight Bearing Restrictions: Yes RLE Weight Bearing: Partial weight bearing      Mobility  Bed Mobility Overal bed mobility: Needs Assistance Bed Mobility: Supine to Sit, Sit to Supine     Supine to sit: Mod assist     General bed mobility comments: needs assist for hooking R foot and B LE management. Needs assist for scooting out towards EOB. Once seated, upright posture noted    Transfers Overall transfer level: Needs assistance Equipment used: Rolling walker (2 wheels) Transfers: Sit to/from Stand, Bed to chair/wheelchair/BSC Sit to Stand: Min assist   Step pivot transfers: Mod assist       General transfer comment: very pain limited and effortful. Pt anxious for WBing    Ambulation/Gait               General Gait Details: uanble  Stairs            Wheelchair Mobility    Modified Rankin (Stroke Patients Only)       Balance Overall balance assessment: Needs assistance Sitting-balance support: No upper extremity supported, Feet supported Sitting balance-Leahy Scale: Good     Standing balance support: Bilateral upper extremity supported Standing balance-Leahy Scale: Poor                               Pertinent Vitals/Pain Pain Assessment Pain Assessment: 0-10 Pain  Score: 9  Pain Location: R LE Pain Descriptors / Indicators: Aching, Grimacing, Shooting, Stabbing, Crying Pain Intervention(s): Limited activity within patient's tolerance, Repositioned    Home Living Family/patient expects to be discharged to:: Private residence Living Arrangements:  Children Available Help at Discharge:  (daughter lives with her but doesnt assist in care) Type of Home: House Home Access: Stairs to enter Entrance Stairs-Rails: Right;Left;Can reach both Entrance Stairs-Number of Steps: 5 from garage Alternate Level Stairs-Number of Steps: flight Home Layout: Two level;Able to live on main level with bedroom/bathroom Home Equipment: Shower seat;Rolling Walker (2 wheels);Rollator (4 wheels);Grab bars - tub/shower;Hand held shower head;BSC/3in1;Tub bench      Prior Function Prior Level of Function : Independent/Modified Independent             Mobility Comments: ambulates without AD typically. Last couple weeks with onset of R hip pain pt has relied on her rollator. ADLs Comments: Ind with self care and IADLs     Hand Dominance        Extremity/Trunk Assessment   Upper Extremity Assessment Upper Extremity Assessment: Generalized weakness    Lower Extremity Assessment Lower Extremity Assessment: Generalized weakness (R LE grossly 2/5)       Communication   Communication: No difficulties  Cognition Arousal/Alertness: Awake/alert Behavior During Therapy: WFL for tasks assessed/performed Overall Cognitive Status: Within Functional Limits for tasks assessed                                 General Comments: pleasant, however limited by pain        General Comments      Exercises Other Exercises Other Exercises: ambulated to [facility], needs min assist for doffing underwear, transfers, and hygiene.   Assessment/Plan    PT Assessment Patient needs continued PT services  PT Problem List Decreased strength;Decreased range of motion;Decreased knowledge of use of DME;Decreased activity tolerance;Decreased balance;Pain;Decreased mobility;Decreased safety awareness       PT Treatment Interventions DME instruction;Balance training;Neuromuscular re-education;Gait training;Stair training;Functional mobility training;Patient/family  education;Therapeutic activities;Therapeutic exercise;Manual techniques    PT Goals (Current goals can be found in the Care Plan section)  Acute Rehab PT Goals Patient Stated Goal: Pt wishes to return home PT Goal Formulation: With patient Time For Goal Achievement: 02/28/22 Potential to Achieve Goals: Fair    Frequency Min 2X/week     Co-evaluation               AM-PAC PT "6 Clicks" Mobility  Outcome Measure Help needed turning from your back to your side while in a flat bed without using bedrails?: None Help needed moving from lying on your back to sitting on the side of a flat bed without using bedrails?: A Little Help needed moving to and from a bed to a chair (including a wheelchair)?: A Lot Help needed standing up from a chair using your arms (e.g., wheelchair or bedside chair)?: A Little Help needed to walk in hospital room?: A Lot Help needed climbing 3-5 steps with a railing? : A Lot 6 Click Score: 16    End of Session Equipment Utilized During Treatment: Gait belt Activity Tolerance: Patient limited by pain Patient left: in bed;with bed alarm set (seated at EOB) Nurse Communication: Mobility status PT Visit Diagnosis: Other abnormalities of gait and mobility (R26.89);Muscle weakness (generalized) (M62.81);Difficulty in walking, not elsewhere classified (R26.2);Pain Pain - Right/Left: Right Pain - part of body: Hip    Time: 0272-5366  PT Time Calculation (min) (ACUTE ONLY): 31 min   Charges:   PT Evaluation $PT Eval Low Complexity: 1 Low PT Treatments $Therapeutic Activity: 8-22 mins        Greggory Stallion, PT, DPT, GCS [phone]   Abigaile Rossie 02/14/2022, 3:04 PM

## 2022-02-14 NOTE — Progress Notes (Signed)
Hypoglycemic Event  CBG: 44  Treatment: 4 oz juice/soda  Symptoms: None  Follow-up CBG: Time:1242 CBG Result:71  Possible Reasons for Event: Other: Pt is reporting that she does not like the food and hasn't eaten today      Aurora Mask

## 2022-02-15 DIAGNOSIS — M25551 Pain in right hip: Secondary | ICD-10-CM | POA: Diagnosis not present

## 2022-02-15 LAB — CBC
HCT: 31 % — ABNORMAL LOW (ref 36.0–46.0)
Hemoglobin: 9.9 g/dL — ABNORMAL LOW (ref 12.0–15.0)
MCH: 29 pg (ref 26.0–34.0)
MCHC: 31.9 g/dL (ref 30.0–36.0)
MCV: 90.9 fL (ref 80.0–100.0)
Platelets: 267 10*3/uL (ref 150–400)
RBC: 3.41 MIL/uL — ABNORMAL LOW (ref 3.87–5.11)
RDW: 14.7 % (ref 11.5–15.5)
WBC: 7.1 10*3/uL (ref 4.0–10.5)
nRBC: 0 % (ref 0.0–0.2)

## 2022-02-15 LAB — BASIC METABOLIC PANEL
Anion gap: 10 (ref 5–15)
BUN: 15 mg/dL (ref 8–23)
CO2: 20 mmol/L — ABNORMAL LOW (ref 22–32)
Calcium: 8 mg/dL — ABNORMAL LOW (ref 8.9–10.3)
Chloride: 101 mmol/L (ref 98–111)
Creatinine, Ser: 0.69 mg/dL (ref 0.44–1.00)
GFR, Estimated: >60 mL/min (ref >60)
Glucose, Bld: 237 mg/dL — ABNORMAL HIGH (ref 70–99)
Potassium: 4 mmol/L (ref 3.5–5.1)
Sodium: 131 mmol/L — ABNORMAL LOW (ref 135–145)

## 2022-02-15 LAB — GLUCOSE, CAPILLARY
Glucose-Capillary: 255 mg/dL — ABNORMAL HIGH (ref 70–99)
Glucose-Capillary: 218 mg/dL — ABNORMAL HIGH (ref 70–99)
Glucose-Capillary: 357 mg/dL — ABNORMAL HIGH (ref 70–99)
Glucose-Capillary: 305 mg/dL — ABNORMAL HIGH (ref 70–99)

## 2022-02-15 LAB — URINE DRUG SCREEN, QUALITATIVE (ARMC ONLY)
Amphetamines, Ur Screen: NOT DETECTED
Barbiturates, Ur Screen: NOT DETECTED
Benzodiazepine, Ur Scrn: NOT DETECTED
Cannabinoid 50 Ng, Ur ~~LOC~~: NOT DETECTED
Cocaine Metabolite,Ur ~~LOC~~: NOT DETECTED
MDMA (Ecstasy)Ur Screen: NOT DETECTED
Methadone Scn, Ur: NOT DETECTED
Opiate, Ur Screen: POSITIVE — AB
Phencyclidine (PCP) Ur S: NOT DETECTED
Tricyclic, Ur Screen: POSITIVE — AB

## 2022-02-15 LAB — OSMOLALITY: Osmolality: 287 mOsm/kg (ref 275–295)

## 2022-02-15 LAB — MAGNESIUM: Magnesium: 1.8 mg/dL (ref 1.7–2.4)

## 2022-02-15 LAB — PHOSPHORUS: Phosphorus: 2.4 mg/dL — ABNORMAL LOW (ref 2.5–4.6)

## 2022-02-15 MED ORDER — INSULIN ASPART 100 UNIT/ML IJ SOLN
5.0000 [IU] | Freq: Once | INTRAMUSCULAR | Status: AC
  Administered 2022-02-15: 5 [IU] via SUBCUTANEOUS
  Filled 2022-02-15: qty 1

## 2022-02-15 MED ORDER — AMLODIPINE BESYLATE 5 MG PO TABS
5.0000 mg | ORAL_TABLET | Freq: Every evening | ORAL | Status: DC
  Administered 2022-02-15 – 2022-02-16 (×2): 5 mg via ORAL
  Filled 2022-02-15 (×2): qty 1

## 2022-02-15 MED ORDER — HYDRALAZINE HCL 50 MG PO TABS: 50.0000 mg | ORAL_TABLET | Freq: Four times a day (QID) | ORAL | Status: DC | PRN

## 2022-02-15 MED ORDER — SODIUM CHLORIDE 1 G PO TABS
1.0000 g | ORAL_TABLET | Freq: Three times a day (TID) | ORAL | Status: AC
  Administered 2022-02-15 – 2022-02-17 (×6): 1 g via ORAL
  Filled 2022-02-15 (×6): qty 1

## 2022-02-15 MED ORDER — K PHOS MONO-SOD PHOS DI & MONO 155-852-130 MG PO TABS
500.0000 mg | ORAL_TABLET | Freq: Three times a day (TID) | ORAL | Status: AC
  Administered 2022-02-15 (×2): 500 mg via ORAL
  Filled 2022-02-15 (×2): qty 2

## 2022-02-15 NOTE — Progress Notes (Signed)
Physical Therapy Treatment Patient Details Name: Suzanne Snyder MRN: [id number] DOB: 27-Feb-1950 Today's Date: 02/15/2022   History of Present Illness 72 y.o. female  seen in ed for right hip pain.  Pt lives at home with her daughter. But is not able to move and looks disheveled.  Per chart patient was discharged on 31 January after surgical repair of the right femoral neck fracture 29th January therapy evaluation recommended skilled nursing facility patient declined at that time and opted for home health.  At that time patient was also treated with right ankle cellulitis.  Patient has a follow-up appointment with orthopedic surgery Dr. Earnestine Leys.    PT Comments    Session broken up into 2 session secondary to bathroom tasks. Pt agreeable to session and able to ambulate around bed to recliner. Still very pain limited and reports she is interested in dc'ing to SNF. Cues for sequencing and safety in regards to transfers. Will continue to progress as able.   Recommendations for follow up therapy are one component of a multi-disciplinary discharge planning process, led by the attending physician.  Recommendations may be updated based on patient status, additional functional criteria and insurance authorization.  Follow Up Recommendations  Skilled nursing-short term rehab (<3 hours/day) Can patient physically be transported by private vehicle: Yes   Assistance Recommended at Discharge Intermittent Supervision/Assistance  Patient can return home with the following A little help with walking and/or transfers;Assist for transportation;Assistance with cooking/housework;A little help with bathing/dressing/bathroom;Help with stairs or ramp for entrance   Equipment Recommendations  None recommended by PT    Recommendations for Other Services       Precautions / Restrictions Precautions Precautions: Fall Restrictions Weight Bearing Restrictions: Yes RLE Weight Bearing: Partial weight bearing      Mobility  Bed Mobility Overal bed mobility: Needs Assistance Bed Mobility: Supine to Sit, Sit to Supine     Supine to sit: Min assist     General bed mobility comments: improved technique this date. Still requires assist for B LE management. Once seated, able to sit with upright posture    Transfers Overall transfer level: Needs assistance Equipment used: Rolling walker (2 wheels) Transfers: Sit to/from Stand, Bed to chair/wheelchair/BSC Sit to Stand: Min assist           General transfer comment: able to stand from low surface. Cues for hand placement. Very effortful    Ambulation/Gait Ambulation/Gait assistance: Min guard Gait Distance (Feet): 15 Feet Assistive device: Rolling walker (2 wheels) Gait Pattern/deviations: Step-to pattern       General Gait Details: ambulated around bed to recliner. Very slow technique with step to gait pattern. Heavy use of B UE on RW   Stairs             Wheelchair Mobility    Modified Rankin (Stroke Patients Only)       Balance Overall balance assessment: Needs assistance Sitting-balance support: No upper extremity supported, Feet supported Sitting balance-Leahy Scale: Good     Standing balance support: Bilateral upper extremity supported Standing balance-Leahy Scale: Fair                              Cognition Arousal/Alertness: Awake/alert Behavior During Therapy: WFL for tasks assessed/performed Overall Cognitive Status: Within Functional Limits for tasks assessed  General Comments: pleasant and agreeable to session        Exercises Other Exercises Other Exercises: ambulated to [facility], needs min assist for doffing underwear, transfers, and hygiene.    General Comments        Pertinent Vitals/Pain Pain Assessment Pain Assessment: 0-10 Pain Score: 5  Pain Location: R LE Pain Descriptors / Indicators: Aching, Grimacing, Shooting, Stabbing,  Crying Pain Intervention(s): Limited activity within patient's tolerance, Repositioned, Premedicated before session    Home Living                          Prior Function            PT Goals (current goals can now be found in the care plan section) Acute Rehab PT Goals Patient Stated Goal: pt willing to go to rehab PT Goal Formulation: With patient Time For Goal Achievement: 02/28/22 Potential to Achieve Goals: Fair Progress towards PT goals: Progressing toward goals    Frequency    Min 2X/week      PT Plan Current plan remains appropriate    Co-evaluation              AM-PAC PT "6 Clicks" Mobility   Outcome Measure  Help needed turning from your back to your side while in a flat bed without using bedrails?: None Help needed moving from lying on your back to sitting on the side of a flat bed without using bedrails?: A Little Help needed moving to and from a bed to a chair (including a wheelchair)?: A Little Help needed standing up from a chair using your arms (e.g., wheelchair or bedside chair)?: A Little Help needed to walk in hospital room?: A Little Help needed climbing 3-5 steps with a railing? : Total 6 Click Score: 17    End of Session Equipment Utilized During Treatment: Gait belt Activity Tolerance: Patient limited by pain Patient left: in chair Nurse Communication: Mobility status PT Visit Diagnosis: Other abnormalities of gait and mobility (R26.89);Muscle weakness (generalized) (M62.81);Difficulty in walking, not elsewhere classified (R26.2);Pain Pain - Right/Left: Right Pain - part of body: Hip     Time: 1051 (1059)-1103 (1120) PT Time Calculation (min) (ACUTE ONLY): 12 min  Charges:  $Gait Training: 8-22 mins $Therapeutic Activity: 8-22 mins                     Greggory Stallion, PT, DPT, GCS [phone]    Suzanne Snyder 02/15/2022, 1:45 PM

## 2022-02-15 NOTE — Progress Notes (Signed)
NUTRITION NOTE RD working remotely.  Consult received for patient on a Carb Modified diet who stated that she did not like anything on the menu and asked if she could have fruit.  Diet liberalized from Carb Modified to Regular today at 1030 by MD. Unable to reach patient by phone today.  RD entered Carbohydrate Counting for People with Diabetes handout from the [employer] into the AVS. Will communicate with RD who will be onsite on 02/16/22.    Jarome Matin, MS, RD, LDN, CNSC Clinical Dietitian PRN/Relief staff On-call/weekend pager # available in [facility]

## 2022-02-15 NOTE — Progress Notes (Signed)
[facility] Progress Note  Patient: Suzanne Snyder    HBZ:[id number]  DOA: 02/13/2022     Date of Service: the patient was seen and examined on 02/15/2022  Chief Complaint  Patient presents with   Hip Pain   Brief hospital course: Suzanne Snyder is a 72 y.o. female with PMH of IDDM T2, anxiety, chronic pain, s/p right hip ORIF done on 02/09/22, PT and OT recommended SNF placement but patient declined.  Patient was discharged home.  Patient returned back to [facility] ED on 224 due to worsening of pain in the right lower extremity, unable to bear weight and move.  [name] is unable to take care of her.  Patient is being admitted for pain management and reevaluation by PT and OT for possible SNF placement.    Assessment and Plan:  Right hip pain s/p right hip ORIF done on 02/09/2022 Continue fall precautions Continue PRN medication for pain control Lower extremity venous duplex negative for DVT Ambulate with assistance, turn patient every 2 hourly Follow PT and OT eval   Febrile illness 2/4 low-grade fever Tmax 100.8 No source of infection, no respiratory symptoms, denies any UTI symptoms Monitor off antibiotics, WBC within normal range Follow-up blood cultures Trend temperature curve   IDDM T2 Held home regimen for now Continue NovoLog sliding scale Continue diabetic diet, monitor FSBG Neuropathy, continued gabapentin home dose  Nicotine dependence, started nicotine patch Smoking cessation counseling done.   Restless leg syndrome, continue Requip 1.5 mg nightly, home dose  Isotonic hyponatremia,  Serum osmolality 287 WNL continue to monitor Na 132--131 Started sodium chloride tablets 1 g p.o. 3 times daily for 2 days Repeat BMP tomorrow a.m.   Hypophosphatemia, Phos repleted.  Monitor and replete as needed.  Iron deficiency anemia, iron level 19, transferrin saturation 6% Started Venofer 200 mg IV daily for 5 days, followed by oral iron  supplement. Folate level within normal range  Vitamin B12 deficiency, vitamin B12 level 190, goal >400, started vitamin B12 1000 mcg IM injection daily for 7 days vs during hospital stay, followed by oral supplement.   Anxiety disorder Resumed Klonopin 0.5 mg twice daily as needed home dose   Body mass index is 17.1 kg/m.  Interventions:       Diet: Carb modified diet DVT Prophylaxis: Subcutaneous Lovenox   Advance goals of care discussion: Full code  Family Communication: family was present at bedside, at the time of interview.  The pt provided permission to discuss medical plan with the family. Opportunity was given to ask question and all questions were answered satisfactorily.   Disposition:  Pt is from Home, admitted with right lower extremity pain status post ORIF, still difficulty ambulation, will need placement, pending PT/OT eval, which precludes a safe discharge. Discharge to SNF, when bed will be available.  Subjective: No significant overnight events, pain 8/10 in the morning, improved to 6/10 after medication.  Patient denied any chest pain or palpitation or shortness of breath.  No any other active issues. Patient wanted to have regular diet, she understand that she needs to avoid sugar due to diabetes.  Physical Exam: General: NAD, lying comfortably Appear in no distress, affect appropriate Eyes: PERRLA ENT: Oral Mucosa Clear, moist  Neck: no JVD,  Cardiovascular: S1 and S2 Present, no Murmur,  Respiratory: good respiratory effort, Bilateral Air entry equal and Decreased, no Crackles, no wheezes Abdomen: Bowel Sound present, Soft and no tenderness,  Skin: no rashes Extremities: no Pedal edema, no calf  tenderness, Rt thigh stitches intact, no tenderness, mild erythema, no signs of infection. Neurologic: without any new focal findings Gait not checked due to patient safety concerns  Vitals:   02/15/22 0401 02/15/22 0402 02/15/22 0500 02/15/22 0913  BP: (!)  147/59   (!) 132/52  Pulse: 87 84  69  Resp: 18   18  Temp: 99.4 F (37.4 C)   98.9 F (37.2 C)  TempSrc: Axillary     SpO2: 90%   96%  Weight:   52.6 kg   Height:        Intake/Output Summary (Last 24 hours) at 02/15/2022 1147 Last data filed at 02/15/2022 0700 Gross per 24 hour  Intake 1372.12 ml  Output 850 ml  Net 522.12 ml   Filed Weights   02/13/22 2006 02/14/22 0448 02/15/22 0500  Weight: 51.7 kg 51 kg 52.6 kg    Data Reviewed: I have personally reviewed and interpreted daily labs, tele strips, imagings as discussed above. I reviewed all nursing notes, pharmacy notes, vitals, pertinent old records I have discussed plan of care as described above with RN and patient/family.  CBC: Recent Labs  Lab 02/10/22 0332 02/11/22 0619 02/13/22 2016 02/14/22 0452 02/15/22 0549  WBC 11.2* 7.9 10.8* 9.0 7.1  NEUTROABS  --   --  7.1  --   --   HGB 13.0 9.2* 11.7* 11.5* 9.9*  HCT 39.9 28.2* 36.4 35.3* 31.0*  MCV 90.3 89.5 92.4 90.1 90.9  PLT 229 215 256 255 657   Basic Metabolic Panel: Recent Labs  Lab 02/08/22 1632 02/09/22 1931 02/10/22 1554 02/11/22 0619 02/13/22 2016 02/14/22 0452 02/14/22 0750 02/15/22 0549  NA 134*   < > 132* 136 134* 132*  --  131*  K 4.1   < > 4.6 3.8 4.1 3.8  --  4.0  CL 104   < > 99 106 101 100  --  101  CO2 23   < > 22 21* 23 22  --  20*  GLUCOSE 154*   < > 186* 148* 46* 126*  --  237*  BUN 14   < > 22 18 17 15   --  15  CREATININE 0.70   < > 0.86 0.63 0.60 0.59  --  0.69  CALCIUM 8.4*   < > 9.0 8.1* 8.8* 8.1*  --  8.0*  MG 1.6*  --   --   --   --   --  1.9 1.8  PHOS  --   --   --   --   --   --  3.7 2.4*   < > = values in this interval not displayed.    Studies: No results found.  Scheduled Meds:  amLODipine  5 mg Oral QPM   cyanocobalamin  1,000 mcg Intramuscular Daily   [START ON 02/21/2022] vitamin B-12  1,000 mcg Oral Daily   enoxaparin (LOVENOX) injection  40 mg Subcutaneous QPM   gabapentin  300 mg Oral QID   insulin  aspart  0-9 Units Subcutaneous TID WC   nicotine  21 mg Transdermal Daily   phosphorus  500 mg Oral TID   polyethylene glycol  17 g Oral Daily   rOPINIRole  1.5 mg Oral QHS   sodium chloride flush  3 mL Intravenous Q12H   Continuous Infusions:  iron sucrose Stopped (02/14/22 1541)   lactated ringers 50 mL/hr at 02/15/22 0916   PRN Meds: acetaminophen **OR** acetaminophen, albuterol, clonazePAM, hydrALAZINE, HYDROcodone-acetaminophen, ipratropium, morphine injection, polyvinyl  alcohol  Time spent: 35 minutes  Author: Val Riles. MD [facility] 02/15/2022 11:47 AM  To reach On-call, see care teams to locate the attending and reach out to them via [email or web address]. If 7PM-7AM, please contact night-coverage If you still have difficulty reaching the attending provider, please page the [facility] (Director on Call) for [facility] on amion for assistance.

## 2022-02-15 NOTE — NC FL2 (Signed)
[place] LEVEL OF CARE FORM     IDENTIFICATION  Patient Name: Suzanne Snyder Birthdate: Jun 03, 1950 Sex: female Admission Date (Current Location): 02/13/2022  [facility] and [place] Number:  Engineering geologist and Address:  [facility], [address], [place], [place] [address]      Provider Number: [id number]  Attending Physician Name and Address:  Val Riles, MD  Relative Name and Phone Number:  Edilia Bo  [phone]    Current Level of Care: Hospital Recommended Level of Care: [place] Prior Approval Number:    Date Approved/Denied:   PASRR Number: [id number]  Discharge Plan:      Current Diagnoses: Patient Active Problem List   Diagnosis Date Noted   Right hip pain 02/14/2022   Hip pain, acute, right 02/13/2022   Electrolyte abnormality 02/13/2022   Anemia 02/13/2022   Malnutrition of moderate degree 02/10/2022   Closed right hip fracture ([place]) 02/08/2022   Hypomagnesemia 02/08/2022   Cellulitis of right ankle 02/08/2022   Hyponatremia 02/08/2022   Tobacco abuse 02/08/2022   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 ([place]) 02/08/2022   Lumbar spondylosis 11/27/2021   Lumbar degenerative disc disease 11/27/2021   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/27/2021   Chronic left shoulder pain 11/27/2021   Cervical facet joint syndrome 11/27/2021   Cervical radicular pain 11/27/2021   Chronic pain syndrome 11/27/2021   Cellulitis of right wrist 12/21/2020   Cellulitis of right thigh 12/11/2020   Type 1 diabetes mellitus with hypoglycemia without coma ([place]) 12/11/2020   Hypoglycemia 12/10/2020    Orientation RESPIRATION BLADDER Height & Weight     Self, Situation, Place, Time  Normal Continent Weight: 115 lb 15.4 oz (52.6 kg) Height:  5\' 8"  (172.7 cm)  BEHAVIORAL SYMPTOMS/MOOD NEUROLOGICAL BOWEL NUTRITION STATUS      Continent Diet (regular)  AMBULATORY STATUS  COMMUNICATION OF NEEDS Skin     Verbally Other (Comment) (erythema)                       Personal Care Assistance Level of Assistance  Bathing, Feeding, Dressing Bathing Assistance: Limited assistance Feeding assistance: Limited assistance Dressing Assistance: Limited assistance     Functional Limitations Info    Sight Info: Impaired        SPECIAL CARE FACTORS FREQUENCY  PT (By licensed PT), OT (By licensed OT)     PT Frequency: 5 times per week OT Frequency: 5 times per week            Contractures      Additional Factors Info  Code Status, Allergies Code Status Info: full Allergies Info: Allergies: Cat Hair Extract, Pregabalin, Erythromycin           Current Medications (02/15/2022):  This is the current hospital active medication list Current Facility-Administered Medications  Medication Dose Route Frequency Provider Last Rate Last Admin   acetaminophen (TYLENOL) tablet 650 mg  650 mg Oral Q6H PRN Para Skeans, MD       Or   acetaminophen (TYLENOL) suppository 650 mg  650 mg Rectal Q6H PRN Para Skeans, MD       albuterol (PROVENTIL) (2.5 MG/3ML) 0.083% nebulizer solution 3 mL  3 mL Nebulization Q6H PRN Para Skeans, MD       amLODipine (NORVASC) tablet 5 mg  5 mg Oral QPM Val Riles, MD       clonazePAM (KLONOPIN) tablet 0.5 mg  0.5  mg Oral BID PRN Val Riles, MD   0.5 mg at 02/14/22 2013   cyanocobalamin (VITAMIN B12) injection 1,000 mcg  1,000 mcg Intramuscular Daily Val Riles, MD   1,000 mcg at 02/15/22 0918   [START ON 02/21/2022] cyanocobalamin (VITAMIN B12) tablet 1,000 mcg  1,000 mcg Oral Daily Val Riles, MD       enoxaparin (LOVENOX) injection 40 mg  40 mg Subcutaneous QPM Val Riles, MD   40 mg at 02/14/22 1747   gabapentin (NEURONTIN) capsule 300 mg  300 mg Oral QID Val Riles, MD   300 mg at 02/15/22 2202   hydrALAZINE (APRESOLINE) tablet 50 mg  50 mg Oral Q6H PRN Val Riles, MD       HYDROcodone-acetaminophen  (NORCO/VICODIN) 5-325 MG per tablet 1 tablet  1 tablet Oral Q4H PRN Para Skeans, MD   1 tablet at 02/14/22 2013   insulin aspart (novoLOG) injection 0-9 Units  0-9 Units Subcutaneous TID WC Florina Ou V, MD   5 Units at 02/15/22 0917   ipratropium (ATROVENT) nebulizer solution 0.5 mg  0.5 mg Nebulization Q6H PRN Para Skeans, MD       iron sucrose (VENOFER) 200 mg in sodium chloride 0.9 % 100 mL IVPB  200 mg Intravenous Q24H Val Riles, MD   Stopped at 02/14/22 1541   lactated ringers infusion   Intravenous Continuous Val Riles, MD 50 mL/hr at 02/15/22 0916 Rate Change at 02/15/22 0916   morphine (PF) 2 MG/ML injection 2 mg  2 mg Intravenous Q4H PRN Para Skeans, MD   2 mg at 02/15/22 5427   nicotine (NICODERM CQ - dosed in mg/24 hours) patch 21 mg  21 mg Transdermal Daily Val Riles, MD   21 mg at 02/15/22 0917   phosphorus (K PHOS NEUTRAL) tablet 500 mg  500 mg Oral TID Val Riles, MD   500 mg at 02/15/22 0918   polyethylene glycol (MIRALAX / GLYCOLAX) packet 17 g  17 g Oral Daily Val Riles, MD   17 g at 02/15/22 0623   polyvinyl alcohol (LIQUIFILM TEARS) 1.4 % ophthalmic solution 1 drop  1 drop Both Eyes PRN Val Riles, MD       rOPINIRole (REQUIP) tablet 1.5 mg  1.5 mg Oral QHS Florina Ou V, MD   1.5 mg at 02/14/22 2114   sodium chloride flush (NS) 0.9 % injection 3 mL  3 mL Intravenous Q12H Para Skeans, MD   3 mL at 02/15/22 7628     Discharge Medications: Please see discharge summary for a list of discharge medications.  Relevant Imaging Results:  Relevant Lab Results:   Additional Information [phone]  Lou Irigoyen E Gus Littler, LCSW

## 2022-02-15 NOTE — TOC Progression Note (Signed)
Transition of Care [facility]) - Progression Note    Patient Details  Name: Suzanne Snyder MRN: [id number] Date of Birth: 01/21/1950  [facility] of [facility] CM/SW [place], LCSW Phone Number: 02/15/2022, 11:13 AM  Clinical Narrative:    Spoke to patient regarding SNF recommendation. Patient is agreeable to SNF for STR. She is not sure of SNF preferences, would like referral sent to local SNFs then wants to review Medicare ratings of the SNFs that make bed offers. Workup started.         Expected Discharge Plan and Services                                               Social Determinants of Health (SDOH) Interventions SDOH Screenings   Food Insecurity: No Food Insecurity (02/14/2022)  Housing: Low Risk  (02/14/2022)  Transportation Needs: No Transportation Needs (02/14/2022)  Utilities: Not At Risk (02/14/2022)  Tobacco Use: High Risk (02/13/2022)    Readmission Risk Interventions     No data to display

## 2022-02-15 NOTE — Discharge Instructions (Signed)

## 2022-02-16 DIAGNOSIS — M25551 Pain in right hip: Secondary | ICD-10-CM | POA: Diagnosis not present

## 2022-02-16 LAB — CBC
HCT: 28.7 % — ABNORMAL LOW (ref 36.0–46.0)
Hemoglobin: 9.3 g/dL — ABNORMAL LOW (ref 12.0–15.0)
MCH: 29.3 pg (ref 26.0–34.0)
MCHC: 32.4 g/dL (ref 30.0–36.0)
MCV: 90.5 fL (ref 80.0–100.0)
Platelets: 287 10*3/uL (ref 150–400)
RBC: 3.17 MIL/uL — ABNORMAL LOW (ref 3.87–5.11)
RDW: 14.6 % (ref 11.5–15.5)
WBC: 5.6 10*3/uL (ref 4.0–10.5)
nRBC: 0 % (ref 0.0–0.2)

## 2022-02-16 LAB — GLUCOSE, CAPILLARY
Glucose-Capillary: 196 mg/dL — ABNORMAL HIGH (ref 70–99)
Glucose-Capillary: 241 mg/dL — ABNORMAL HIGH (ref 70–99)
Glucose-Capillary: 269 mg/dL — ABNORMAL HIGH (ref 70–99)
Glucose-Capillary: 294 mg/dL — ABNORMAL HIGH (ref 70–99)
Glucose-Capillary: 332 mg/dL — ABNORMAL HIGH (ref 70–99)

## 2022-02-16 LAB — BASIC METABOLIC PANEL
Anion gap: 8 (ref 5–15)
BUN: 14 mg/dL (ref 8–23)
CO2: 26 mmol/L (ref 22–32)
Calcium: 8.1 mg/dL — ABNORMAL LOW (ref 8.9–10.3)
Chloride: 102 mmol/L (ref 98–111)
Creatinine, Ser: 0.54 mg/dL (ref 0.44–1.00)
GFR, Estimated: >60 mL/min (ref >60)
Glucose, Bld: 190 mg/dL — ABNORMAL HIGH (ref 70–99)
Potassium: 3.8 mmol/L (ref 3.5–5.1)
Sodium: 136 mmol/L (ref 135–145)

## 2022-02-16 LAB — MAGNESIUM: Magnesium: 2 mg/dL (ref 1.7–2.4)

## 2022-02-16 LAB — MISC LABCORP TEST (SEND OUT): Labcorp test code: 81950

## 2022-02-16 LAB — PHOSPHORUS: Phosphorus: 2.9 mg/dL (ref 2.5–4.6)

## 2022-02-16 MED ORDER — BISACODYL 10 MG RE SUPP: 10.0000 mg | Freq: Every day | RECTAL | Status: DC | PRN

## 2022-02-16 MED ORDER — INSULIN GLARGINE-YFGN 100 UNIT/ML ~~LOC~~ SOLN
7.0000 [IU] | Freq: Every day | SUBCUTANEOUS | Status: DC
  Administered 2022-02-16 – 2022-02-17 (×2): 7 [IU] via SUBCUTANEOUS
  Filled 2022-02-16 (×2): qty 0.07

## 2022-02-16 MED ORDER — BISACODYL 5 MG PO TBEC
10.0000 mg | DELAYED_RELEASE_TABLET | Freq: Every day | ORAL | Status: DC | PRN
  Administered 2022-02-16: 10 mg via ORAL
  Filled 2022-02-16: qty 2

## 2022-02-16 MED ORDER — INSULIN ASPART 100 UNIT/ML IJ SOLN
3.0000 [IU] | Freq: Three times a day (TID) | INTRAMUSCULAR | Status: DC
  Administered 2022-02-16 – 2022-02-17 (×3): 3 [IU] via SUBCUTANEOUS
  Filled 2022-02-16 (×3): qty 1

## 2022-02-16 NOTE — Progress Notes (Signed)
Occupational Therapy Treatment Patient Details Name: CHERY GIUSTO MRN: [id number] DOB: 1951-01-02 Today's Date: 02/16/2022   History of present illness 72 y.o. female  seen in ed for right hip pain.  Pt lives at home with her daughter. But is not able to move and looks disheveled.  Per chart patient was discharged on 31 January after surgical repair of the right femoral neck fracture 29th January therapy evaluation recommended skilled nursing facility patient declined at that time and opted for home health.  At that time patient was also treated with right ankle cellulitis.  Patient has a follow-up appointment with orthopedic surgery Dr. Earnestine Leys.   OT comments  Pt seen for OT session this date.  Pt was able to perform multiple SPT this date, moving from bed to [facility] and BSC to standing at sink for oral care.  Pt declined walker at bedside as she felt it was too crowded, and instead used surrounding furniture including bed rail, arm rests of commode, and sink countertop for support.  Pt required min vc for hand placement and reminder of PWB status to the RLE, which pt appeared to maintain well.  Once back in bed, pt was instructed in use of towel and gait belt or looped belt from home as a leg lifter for bed mobility.  Pt will continue to benefit from skilled OT in the acute setting to maximize indep with daily tasks.    Recommendations for follow up therapy are one component of a multi-disciplinary discharge planning process, led by the attending physician.  Recommendations may be updated based on patient status, additional functional criteria and insurance authorization.    Follow Up Recommendations  Skilled nursing-short term rehab (<3 hours/day)     Assistance Recommended at Discharge    Patient can return home with the following  Help with stairs or ramp for entrance;Assist for transportation;Assistance with cooking/housework;A lot of help with walking and/or transfers;A lot of help  with bathing/dressing/bathroom   Equipment Recommendations  None recommended by OT    Recommendations for Other Services      Precautions / Restrictions Precautions Precautions: Fall Restrictions Weight Bearing Restrictions: Yes RLE Weight Bearing: Partial weight bearing Other Position/Activity Restrictions: PWB 50%. Educated pt to utilize TTWB on RLE to ensure < 50% of weight is on her LE.       Mobility Bed Mobility Overal bed mobility: Needs Assistance Bed Mobility: Supine to Sit, Sit to Supine     Supine to sit: Supervision, HOB elevated Sit to supine: Min assist, HOB elevated   General bed mobility comments: Min A to move RLE on/off bed with L foot hooked under the R.  Practiced with use of looped gait belt as a leg lifter and also demonstrated with blanket/towel. Patient Response: Cooperative  Transfers Overall transfer level: Needs assistance   Transfers: Sit to/from Stand Sit to Stand: Min guard Stand pivot transfers: Min guard, From elevated surface   Step pivot transfers: Min guard, From elevated surface     General transfer comment: Pt used bed rail, armrests of BSC, and countertop to perform stand pivot and step pivots at bedside with min guard.     Balance Overall balance assessment: Needs assistance Sitting-balance support: No upper extremity supported, Feet supported Sitting balance-Leahy Scale: Good     Standing balance support: Bilateral upper extremity supported Standing balance-Leahy Scale: Fair Standing balance comment: Able to manage single UE support but with min A from OT while standing at sink for oral care.  ADL either performed or assessed with clinical judgement   ADL Overall ADL's : Needs assistance/impaired     Grooming: Oral care;Minimal assistance;Standing               Lower Body Dressing: Moderate assistance;Sitting/lateral leans Lower Body Dressing Details (indicate cue type and  reason): assist to don/doff R sock Toilet Transfer: Min guard;BSC/3in1   Toileting- Water quality scientist and Hygiene: Min guard;Sit to/from stand;Sitting/lateral lean Toileting - Clothing Manipulation Details (indicate cue type and reason): Able to lower/hike underwear in standing with min guard; pt completed hygiene in sitting with set up and lateral leans     Functional mobility during ADLs: Min guard;Rolling walker (2 wheels) General ADL Comments: Pt performed stand pivot with min guard holding to surrounding furniture    Extremity/Trunk Assessment Upper Extremity Assessment Upper Extremity Assessment: Generalized weakness   Lower Extremity Assessment Lower Extremity Assessment: Generalized weakness RLE Deficits / Details: R hip fx repair. 50% PWB   Cervical / Trunk Assessment Cervical / Trunk Assessment: Normal    Vision Patient Visual Report: No change from baseline                Cognition Arousal/Alertness: Awake/alert Behavior During Therapy: WFL for tasks assessed/performed Overall Cognitive Status: Within Functional Limits for tasks assessed                                 General Comments: Pleasant and cooperative.  Briefly tearful reporting that she didn't want to be a burden.  OT reassured pt of her good performance today and she was certainly not a burden.  OT reassured pt that continued therapy would work to maximize her indep.                     General Comments      Pertinent Vitals/ Pain       Pain Assessment Pain Score: 6  Pain Location: R LE Pain Descriptors / Indicators: Aching, Grimacing, Guarding Pain Intervention(s): Limited activity within patient's tolerance, Premedicated before session, Repositioned, Monitored during session  Home Living                                          Prior Functioning/Environment              Frequency  Min 2X/week        Progress Toward Goals  OT  Goals(current goals can now be found in the care plan section)  Progress towards OT goals: Progressing toward goals  Acute Rehab OT Goals Patient Stated Goal: to decrease pain OT Goal Formulation: With patient Time For Goal Achievement: 02/28/22 Potential to Achieve Goals: [place] Discharge plan remains appropriate    Co-evaluation                 AM-PAC OT "6 Clicks" Daily Activity     Outcome Measure   Help from another person eating meals?: None Help from another person taking care of personal grooming?: A Little Help from another person toileting, which includes using toliet, bedpan, or urinal?: A Little Help from another person bathing (including washing, rinsing, drying)?: A Lot Help from another person to put on and taking off regular upper body clothing?: None Help from another person to put on and taking off regular lower body clothing?: A  Lot 6 Click Score: 18    End of Session Equipment Utilized During Treatment: Gait belt  OT Visit Diagnosis: Unsteadiness on feet (R26.81);Repeated falls (R29.6);Muscle weakness (generalized) (M62.81)   Activity Tolerance Patient tolerated treatment well   Patient Left in bed;with call bell/phone within reach   Nurse Communication  (notified RN that pt was able to have BM and that she requests something for her hard stools)        Time: 3474-2595 OT Time Calculation (min): 31 min  Charges: OT General Charges $OT Visit: 1 Visit OT Treatments $Self Care/Home Management : 23-37 mins  Leta Speller, MS, OTR/L   Darleene Cleaver 02/16/2022, 1:02 PM

## 2022-02-16 NOTE — Anesthesia Postprocedure Evaluation (Signed)
Anesthesia Post Note  Patient: Suzanne Snyder  Procedure(s) Performed: PERCUTANEOUS FIXATION OF FEMORAL NECK (Right: Hip)  Patient location during evaluation: PACU Anesthesia Type: General Level of consciousness: awake and alert Pain management: pain level controlled Vital Signs Assessment: post-procedure vital signs reviewed and stable Respiratory status: spontaneous breathing, nonlabored ventilation, respiratory function stable and patient connected to nasal cannula oxygen Cardiovascular status: blood pressure returned to baseline and stable Postop Assessment: no apparent nausea or vomiting Anesthetic complications: no   There were no known notable events for this encounter.   Last Vitals:  Vitals:   02/11/22 0054 02/11/22 0749  BP: 110/83 (!) 125/56  Pulse: 64 61  Resp: 16 18  Temp: 36.6 C 36.8 C  SpO2: 97% 98%    Last Pain:  Vitals:   02/11/22 1200  TempSrc:   PainSc: 0-No pain                 Molli Barrows

## 2022-02-16 NOTE — Progress Notes (Signed)
[facility] Progress Note  Patient: Suzanne Snyder    ALP:[id number]  DOA: 02/13/2022     Date of Service: the patient was seen and examined on 02/16/2022  Chief Complaint  Patient presents with   Hip Pain   Brief hospital course: Suzanne Snyder is a 72 y.o. female with PMH of IDDM T2, anxiety, chronic pain, s/p right hip ORIF done on 02/09/22, PT and OT recommended SNF placement but patient declined.  Patient was discharged home.  Patient returned back to [facility] ED on 224 due to worsening of pain in the right lower extremity, unable to bear weight and move.  [name] is unable to take care of her.  Patient is being admitted for pain management and reevaluation by PT and OT for possible SNF placement.    Assessment and Plan:  Right hip pain s/p right hip ORIF done on 02/09/2022 2/2 right hip x-ray: s/p ORIF for recent subcapital right femoral neck fracture with multiple screws. Left hip cephalomedullary nail placement. No new fracture. Continue fall precautions Continue PRN medication for pain control Lower extremity venous duplex negative for DVT Ambulate with assistance, turn patient every 2 hourly PT and OT eval done, recommend SNF placement   Febrile illness, resolved  2/4 low-grade fever Tmax 100.8 No source of infection, no respiratory symptoms, denies any UTI symptoms Monitor off antibiotics, WBC within normal range blood cultures NGTD Trend temperature curve, remained afebrile   IDDM T1 Held home regimen for now Continue NovoLog sliding scale Started Semglee 7 units subcu daily Continue diabetic diet, monitor FSBG Neuropathy, continued gabapentin home dose  Nicotine dependence, started nicotine patch Smoking cessation counseling done.   Restless leg syndrome, continue Requip 1.5 mg nightly, home dose  Isotonic hyponatremia,  Serum osmolality 287 WNL continue to monitor Na 132--131 Started sodium chloride tablets 1 g p.o. 3 times daily for 2  days Repeat BMP tomorrow a.m.   Hypophosphatemia, Phos repleted.  Monitor and replete as needed.  Iron deficiency anemia, iron level 19, transferrin saturation 6% Started Venofer 200 mg IV daily for 5 days, followed by oral iron supplement. Folate level within normal range  Vitamin B12 deficiency, vitamin B12 level 190, goal >400, started vitamin B12 1000 mcg IM injection daily for 7 days vs during hospital stay, followed by oral supplement.   Anxiety disorder Resumed Klonopin 0.5 mg twice daily as needed home dose  Hypertension, patient had elevated blood pressure could be secondary to discomfort and pain. Started amlodipine 5 mg p.o. nightly with holding parameters We will continue to monitor BP and titrate medication accordingly.   Body mass index is 17.1 kg/m.  Interventions:       Diet: Carb modified diet DVT Prophylaxis: Subcutaneous Lovenox   Advance goals of care discussion: Full code  Family Communication: family was present at bedside, at the time of interview.  The pt provided permission to discuss medical plan with the family. Opportunity was given to ask question and all questions were answered satisfactorily.   Disposition:  Pt is from Home, admitted with right lower extremity pain status post ORIF, still difficulty ambulation, will need placement, pending PT/OT eval, which precludes a safe discharge. Discharge to SNF, when bed will be available.  Subjective: No significant overnight events, complaining of pain 7/10, does not feel improvement.  Denies any other active issues.   Physical Exam: General: NAD, lying comfortably Appear in no distress, affect appropriate Eyes: PERRLA ENT: Oral Mucosa Clear, moist  Neck: no JVD,  Cardiovascular: S1 and S2 Present, no Murmur,  Respiratory: good respiratory effort, Bilateral Air entry equal and Decreased, no Crackles, no wheezes Abdomen: Bowel Sound present, Soft and no tenderness,  Skin: no  rashes Extremities: no Pedal edema, no calf tenderness, Rt thigh stitches intact, no tenderness, mild erythema, no signs of infection. Neurologic: without any new focal findings Gait not checked due to patient safety concerns  Vitals:   02/15/22 0913 02/15/22 1724 02/16/22 0506 02/16/22 0813  BP: (!) 132/52 (!) 145/61 (!) 136/59 123/62  Pulse: 69 69 (!) 59 69  Resp: 18 18 18 17   Temp: 98.9 F (37.2 C) 98.7 F (37.1 C) 97.7 F (36.5 C) 98.1 F (36.7 C)  TempSrc:   Oral   SpO2: 96% 95% 100% 94%  Weight:      Height:        Intake/Output Summary (Last 24 hours) at 02/16/2022 1428 Last data filed at 02/16/2022 1052 Gross per 24 hour  Intake 2062.27 ml  Output --  Net 2062.27 ml   Filed Weights   02/13/22 2006 02/14/22 0448 02/15/22 0500  Weight: 51.7 kg 51 kg 52.6 kg    Data Reviewed: I have personally reviewed and interpreted daily labs, tele strips, imagings as discussed above. I reviewed all nursing notes, pharmacy notes, vitals, pertinent old records I have discussed plan of care as described above with RN and patient/family.  CBC: Recent Labs  Lab 02/11/22 0619 02/13/22 2016 02/14/22 0452 02/15/22 0549 02/16/22 0430  WBC 7.9 10.8* 9.0 7.1 5.6  NEUTROABS  --  7.1  --   --   --   HGB 9.2* 11.7* 11.5* 9.9* 9.3*  HCT 28.2* 36.4 35.3* 31.0* 28.7*  MCV 89.5 92.4 90.1 90.9 90.5  PLT 215 256 255 267 462   Basic Metabolic Panel: Recent Labs  Lab 02/11/22 0619 02/13/22 2016 02/14/22 0452 02/14/22 0750 02/15/22 0549 02/16/22 0430  NA 136 134* 132*  --  131* 136  K 3.8 4.1 3.8  --  4.0 3.8  CL 106 101 100  --  101 102  CO2 21* 23 22  --  20* 26  GLUCOSE 148* 46* 126*  --  237* 190*  BUN 18 17 15   --  15 14  CREATININE 0.63 0.60 0.59  --  0.69 0.54  CALCIUM 8.1* 8.8* 8.1*  --  8.0* 8.1*  MG  --   --   --  1.9 1.8 2.0  PHOS  --   --   --  3.7 2.4* 2.9    Studies: No results found.  Scheduled Meds:  amLODipine  5 mg Oral QPM   cyanocobalamin  1,000 mcg  Intramuscular Daily   [START ON 02/21/2022] vitamin B-12  1,000 mcg Oral Daily   enoxaparin (LOVENOX) injection  40 mg Subcutaneous QPM   gabapentin  300 mg Oral QID   insulin aspart  0-9 Units Subcutaneous TID WC   insulin glargine-yfgn  7 Units Subcutaneous Daily   nicotine  21 mg Transdermal Daily   polyethylene glycol  17 g Oral Daily   rOPINIRole  1.5 mg Oral QHS   sodium chloride flush  3 mL Intravenous Q12H   sodium chloride  1 g Oral TID WC   Continuous Infusions:  iron sucrose Stopped (02/15/22 1237)   lactated ringers 50 mL/hr at 02/16/22 1052   PRN Meds: acetaminophen **OR** acetaminophen, albuterol, bisacodyl, bisacodyl, clonazePAM, hydrALAZINE, HYDROcodone-acetaminophen, ipratropium, morphine injection, polyvinyl alcohol  Time spent: 35 minutes  Author: Val Riles.  MD [facility] 02/16/2022 2:28 PM  To reach On-call, see care teams to locate the attending and reach out to them via [email or web address]. If 7PM-7AM, please contact night-coverage If you still have difficulty reaching the attending provider, please page the [facility] (Director on Call) for [facility] on amion for assistance.

## 2022-02-16 NOTE — TOC Progression Note (Signed)
Transition of Care [facility]) - Progression Note    Patient Details  Name: Suzanne Snyder MRN: [id number] Date of Birth: Sep 06, 1950  [facility] of [facility]) CM/SW Contact  Beverly Sessions, RN Phone Number: 02/16/2022, 10:28 AM  Clinical Narrative:    Met with patient at bedside.  Provided SNF bed offers, and medicare.gov ratings.  Patient accepts bed at [facility].  Accepted in [place].  Magda Paganini at [employer] notified         Expected Discharge Plan and Services                                               Social Determinants of Health ([place]) Interventions SDOH Screenings   Food Insecurity: No Food Insecurity (02/14/2022)  Housing: Low Risk  (02/14/2022)  Transportation Needs: No Transportation Needs (02/14/2022)  Utilities: Not At Risk (02/14/2022)  Tobacco Use: High Risk (02/13/2022)    Readmission Risk Interventions     No data to display

## 2022-02-16 NOTE — Inpatient Diabetes Management (Addendum)
Inpatient Diabetes Program Recommendations  AACE/ADA: New Consensus Statement on Inpatient Glycemic Control (2015)  Target Ranges:  Prepandial:   less than 140 mg/dL      Peak postprandial:   less than 180 mg/dL (1-2 hours)      Critically ill patients:  140 - 180 mg/dL    Latest Reference Range & Units 02/15/22 08:12 02/15/22 12:10 02/15/22 16:48 02/15/22 20:41 02/16/22 00:28  Glucose-Capillary 70 - 99 mg/dL 255 (H)  5 units Novolog @0917  218 (H)  3 units Novolog  357 (H)  9 units Novolog  305 (H)  5 units Novolog @2257  269 (H)  (H): Data is abnormally high  Latest Reference Range & Units 02/16/22 08:12  Glucose-Capillary 70 - 99 mg/dL 294 (H)  (H): Data is abnormally high   History: Type 1 Diabetes (managed by Dr. Gabriel Carina ENDO)  Home Meds: Omni Pod Dash Insulin Pump  Current Orders: Novolog Sensitive Correction Scale/ SSI (0-9 units) TID AC    MD- Note rise in CBGs yesterday.  If pt is off her Home Insulin Pump, she will need Basal Insulin + Novolog Meal Coverage if eating (pt with History of Type 1 diabetes).  She gets about 9 units total basal insulin on her home insulin pump daily over 24 hour period (see pump settings below).  Please start Semglee 7 units Daily this AM (asap) (~75% total basal rate on her home pump)   Addendum 3pm--Met w/ pt at bedside.  Pt told me her pump was not on her body right now--Pump supplies at home.  Usually covers her CBGs and Carbohydrate intake with her pump (enters CBG reading and carbohydrate intake into her pump and the pump calculates how much Novolog for her to bolus herself with).  I discussed w/ pt that we have added basal insulin this AM (Semglee 7 units daily).  Told pt I will reach out to Dr. Dwyane Dee and request Novolog Meal Coverage as well--orders given by Dr. Dwyane Dee to start Novolog 3 units TID with meals for meal coverage.  Pt appreciative of visit.    Per Dr. Joycie Peek notes from Office Visit 01/19/2022: Type 1 diabetes --  Her last Hb A1c is now 7%. She has an OmniPod Dash insulin pump (code [id number]) and a Contour Next One glucometer. Devices were downloaded and reviewed. Pump settings: Basal rate 12 am 0.65 units/hr 3 am 0.45 units/hr 6 am 0.85 units/hr 8 am 0.45 units/hr 12 pm 0.3 units/hr 6 pm 0.15 units/hr 24hr basal = 9.5 units  Bolus settings: Sensitivity 12 am = 90  Carb ratio 12am = 18  Target blood glucose 120 Correction 130      --Will follow patient during hospitalization--  Wyn Quaker RN, MSN, [place] Diabetes Coordinator Inpatient Glycemic Control Team Team Pager: [phone] (8a-5p)

## 2022-02-17 DIAGNOSIS — M25551 Pain in right hip: Secondary | ICD-10-CM | POA: Diagnosis not present

## 2022-02-17 LAB — GLUCOSE, CAPILLARY
Glucose-Capillary: 298 mg/dL — ABNORMAL HIGH (ref 70–99)
Glucose-Capillary: 257 mg/dL — ABNORMAL HIGH (ref 70–99)

## 2022-02-17 LAB — CBC
HCT: 30.6 % — ABNORMAL LOW (ref 36.0–46.0)
Hemoglobin: 9.8 g/dL — ABNORMAL LOW (ref 12.0–15.0)
MCH: 29.1 pg (ref 26.0–34.0)
MCHC: 32 g/dL (ref 30.0–36.0)
MCV: 90.8 fL (ref 80.0–100.0)
Platelets: 359 10*3/uL (ref 150–400)
RBC: 3.37 MIL/uL — ABNORMAL LOW (ref 3.87–5.11)
RDW: 14.6 % (ref 11.5–15.5)
WBC: 6.4 10*3/uL (ref 4.0–10.5)
nRBC: 0 % (ref 0.0–0.2)

## 2022-02-17 LAB — BASIC METABOLIC PANEL
Anion gap: 9 (ref 5–15)
BUN: 14 mg/dL (ref 8–23)
CO2: 23 mmol/L (ref 22–32)
Calcium: 8.4 mg/dL — ABNORMAL LOW (ref 8.9–10.3)
Chloride: 101 mmol/L (ref 98–111)
Creatinine, Ser: 0.62 mg/dL (ref 0.44–1.00)
GFR, Estimated: >60 mL/min (ref >60)
Glucose, Bld: 291 mg/dL — ABNORMAL HIGH (ref 70–99)
Potassium: 3.8 mmol/L (ref 3.5–5.1)
Sodium: 133 mmol/L — ABNORMAL LOW (ref 135–145)

## 2022-02-17 LAB — MAGNESIUM: Magnesium: 1.5 mg/dL — ABNORMAL LOW (ref 1.7–2.4)

## 2022-02-17 LAB — PHOSPHORUS: Phosphorus: 2.8 mg/dL (ref 2.5–4.6)

## 2022-02-17 MED ORDER — MAGNESIUM 400 MG PO TABS: 400.0000 mg | ORAL_TABLET | Freq: Two times a day (BID) | ORAL | Status: AC

## 2022-02-17 MED ORDER — CYANOCOBALAMIN 1000 MCG PO TABS: 1000.0000 ug | ORAL_TABLET | Freq: Every day | ORAL | 2 refills | Status: AC

## 2022-02-17 MED ORDER — SALINE SPRAY 0.65 % NA SOLN
1.0000 | NASAL | Status: DC | PRN
  Administered 2022-02-17: 1 via NASAL
  Filled 2022-02-17: qty 44

## 2022-02-17 MED ORDER — OXYCODONE HCL 5 MG PO TABS: 5.0000 mg | ORAL_TABLET | Freq: Four times a day (QID) | ORAL | 0 refills | Status: AC | PRN

## 2022-02-17 MED ORDER — VITAMIN D (ERGOCALCIFEROL) 1.25 MG (50000 UNIT) PO CAPS
50000.0000 [IU] | ORAL_CAPSULE | ORAL | Status: DC
  Administered 2022-02-17: 50000 [IU] via ORAL
  Filled 2022-02-17: qty 1

## 2022-02-17 MED ORDER — HYDRALAZINE HCL 50 MG PO TABS: 50.0000 mg | ORAL_TABLET | Freq: Three times a day (TID) | ORAL | Status: DC | PRN

## 2022-02-17 MED ORDER — POLYVINYL ALCOHOL 1.4 % OP SOLN: 1.0000 [drp] | OPHTHALMIC | 0 refills | Status: DC | PRN

## 2022-02-17 MED ORDER — BISACODYL 5 MG PO TBEC: 10.0000 mg | DELAYED_RELEASE_TABLET | Freq: Every day | ORAL | 0 refills | Status: DC | PRN

## 2022-02-17 MED ORDER — MAGNESIUM SULFATE 4 GM/100ML IV SOLN
4.0000 g | Freq: Once | INTRAVENOUS | Status: AC
  Administered 2022-02-17: 4 g via INTRAVENOUS
  Filled 2022-02-17: qty 100

## 2022-02-17 MED ORDER — CLONAZEPAM 0.5 MG PO TABS: 0.5000 mg | ORAL_TABLET | Freq: Two times a day (BID) | ORAL | 0 refills | Status: DC | PRN

## 2022-02-17 MED ORDER — BISACODYL 10 MG RE SUPP: 10.0000 mg | Freq: Every day | RECTAL | 0 refills | Status: DC | PRN

## 2022-02-17 MED ORDER — AMLODIPINE BESYLATE 5 MG PO TABS: 5.0000 mg | ORAL_TABLET | Freq: Every evening | ORAL | 2 refills | Status: DC

## 2022-02-17 MED ORDER — POLYSACCHARIDE IRON COMPLEX 150 MG PO CAPS: 150.0000 mg | ORAL_CAPSULE | Freq: Every day | ORAL | 2 refills | Status: AC

## 2022-02-17 MED ORDER — ACETAMINOPHEN 325 MG PO TABS: 650.0000 mg | ORAL_TABLET | Freq: Four times a day (QID) | ORAL | Status: DC | PRN

## 2022-02-17 NOTE — Care Management Important Message (Signed)
Important Message  Patient Details  Name: Suzanne Snyder MRN: [id number] Date of Birth: 22-Feb-1950   Medicare Important Message Given:  Yes     Suzanne Snyder 02/17/2022, 12:44 PM

## 2022-02-17 NOTE — Discharge Summary (Addendum)
[facility] Discharge Summary   Patient: Suzanne Snyder:[id number]  PCP: Baxter Hire, MD  Date of admission: 02/13/2022   Date of discharge:  02/17/2022     Discharge Diagnoses:  Principal Problem:   Hip pain, acute, right Active Problems:   Closed right hip fracture ([place])   Anemia   Type 1 diabetes mellitus with hypoglycemia without coma (HCC)   Electrolyte abnormality   Tobacco abuse   Right hip pain   Admitted From: Home Disposition:  SNF   Recommendations for Outpatient Follow-up:  Follow-up with PCP in 1 week.  Repeat iron profile, folic acid level, vitamin B12 level, vitamin D level after 3 months. Follow-up with orthopedic surgery in 1 week for postop check Follow-up with PCP for lung cancer screening. Follow up LABS/TEST:  as above   Contact information for after-discharge care     [facility] Preferred SNF .   Service: Skilled Nursing Contact information: [place] [phone]                    Diet recommendation: Carb modified diet  Activity: The patient is advised to gradually reintroduce usual activities, as tolerated  Discharge Condition: stable  Code Status: Full code   History of present illness: As per the H and P dictated on admission Hospital Course:  Suzanne Snyder is a 72 y.o. female with PMH of IDDM T2, anxiety, chronic pain, s/p right hip ORIF done on 02/09/22, PT and OT recommended SNF placement but patient declined.  Patient was discharged home.  Patient returned back to [facility] ED on 224 due to worsening of pain in the right lower extremity, unable to bear weight and move.  [name] is unable to take care of her.  Patient is being admitted for pain management and reevaluation by PT and OT for possible SNF placement.  Assessment and Plan: # Right hip pain s/p right hip ORIF done  on 02/09/2022 2/2 right hip x-ray: s/p ORIF for recent subcapital right femoral neck fracture with multiple screws. Left hip cephalomedullary nail placement. No new fracture. Continue fall precautions, Continue PRN medication for pain control. Lower extremity venous duplex negative for DVT.  Continue Lovenox for total 28 days. Ambulate with assistance, turn patient every 2 hourly. PT and OT eval done, recommend SNF placement # Febrile illness, resolved, On 2/4 low-grade fever Tmax 100.8. No source of infection, no respiratory symptoms, denies any UTI symptoms. Monitored off antibiotics, WBC within normal range. blood cultures NGTD. temperature curve, remained afebrile # IDDM T1, resumed home regimen, monitor FSBG, continue diabetic diet. Neuropathy, continued gabapentin home dose # Nicotine dependence, s/p nicotine patch. Smoking cessation counseling done. # Restless leg syndrome, continue Requip 1.5 mg nightly, home dose # Isotonic hyponatremia, Serum osmolality 287 WNL, Na 132--131--136--133. S/p sodium chloride tablets 1 g p.o. 3 times daily for 2 days.  Continue normal salt diet for few days until sodium level is normal.  # Hypophosphatemia, Phos repleted.  Monitor and replete as needed. # Hypomagnesemia, mag repleted.  Continue oral supplement for 7 days.   #Iron deficiency anemia, iron level 19, transferrin saturation 6%, s/p Venofer 200 mg IV daily for 5 days, followed by oral iron supplement.Folate level within normal range # Vitamin B12 deficiency, vitamin B12 level 190, goal >400, started vitamin B12 1000 mcg IM injection daily for 7 days vs during  hospital stay, followed by oral supplement. # Vitamin D deficiency: started vitamin D 50,000 units p.o. weekly, follow with PCP to repeat vitamin D level after 3 to 6 months. # Anxiety disorder, Resumed Klonopin 0.5 mg twice daily as needed home dose # Hypertension, patient had elevated blood pressure could be secondary to discomfort and pain.  Started amlodipine 5 mg p.o. nightly with holding parameters.  Use hydralazine as needed if SBP greater than 160 mmHg. continue to monitor BP and titrate medication accordingly.   Body mass index is 17.1 kg/m.  Interventions:   Pain control  - [place] Controlled Substance Reporting System database could not be reviewed, website error. - 5 day supply was provided.  Prescribed oxycodone 5 mg 10 pills - Patient was instructed, not to drive, operate heavy machinery, perform activities at heights, swimming or participation in water activities or provide baby sitting services while on Pain, Sleep and Anxiety Medications; until her outpatient Physician has advised to do so again.  - Also recommended to not to take more than prescribed Pain, Sleep and Anxiety Medications.  Patient was seen by physical therapy, who recommended Therapy, SNF placement, which was arranged. On the day of the discharge the patient's vitals were stable, and no other acute medical condition were reported by patient. the patient was felt safe to be discharge at [facility] .  Consultants: None Procedures: None  Discharge Exam: General: Appear in no distress, no Rash; Oral Mucosa Clear, moist. Cardiovascular: S1 and S2 Present, no Murmur, Respiratory: normal respiratory effort, Bilateral Air entry present and no Crackles, no wheezes Abdomen: Bowel Sound present, Soft and no tenderness, no hernia Extremities: no Pedal edema, no calf tenderness, Right thigh stitches intact, mild erythema, no tenderness, no signs of infection. Neurology: alert and oriented to time, place, and person affect appropriate.  Filed Weights   02/13/22 2006 02/14/22 0448 02/15/22 0500  Weight: 51.7 kg 51 kg 52.6 kg   Vitals:   02/17/22 0429 02/17/22 0817  BP: (!) 146/69 (!) 154/71  Pulse: 73 67  Resp: 18 12  Temp: 97.8 F (36.6 C) 98.2 F (36.8 C)  SpO2: 91% 100%    DISCHARGE MEDICATION: Allergies as of 02/17/2022       Reactions    Cat Hair Extract Shortness Of Breath   Pregabalin Anaphylaxis   Erythromycin Other (See Comments)   Severe stomach upset        Medication List     STOP taking these medications    acyclovir 400 MG tablet Commonly known as: ZOVIRAX   ammonium lactate 12 % cream Commonly known as: Lac-Hydrin   clobetasol cream 0.05 % Commonly known as: TEMOVATE   traMADol 50 MG tablet Commonly known as: ULTRAM   triamcinolone cream 0.1 % Commonly known as: KENALOG       TAKE these medications    acetaminophen 325 MG tablet Commonly known as: TYLENOL Take 2 tablets (650 mg total) by mouth every 6 (six) hours as needed for mild pain, moderate pain, fever or headache (or Fever >/= 101).   albuterol 108 (90 Base) MCG/ACT inhaler Commonly known as: VENTOLIN HFA Inhale into the lungs every 6 (six) hours as needed for wheezing or shortness of breath.   amLODipine 5 MG tablet Commonly known as: NORVASC Take 1 tablet (5 mg total) by mouth every evening.   BENADRYL ALLERGY PO Take 50 mg by mouth as needed.   bisacodyl 5 MG EC tablet Commonly known as: DULCOLAX Take 2 tablets (10 mg  total) by mouth daily as needed for moderate constipation.   bisacodyl 10 MG suppository Commonly known as: DULCOLAX Place 1 suppository (10 mg total) rectally daily as needed for severe constipation.   clonazePAM 0.5 MG tablet Commonly known as: KLONOPIN Take 1 tablet (0.5 mg total) by mouth 2 (two) times daily as needed for anxiety.   cyanocobalamin 1000 MCG tablet Take 1 tablet (1,000 mcg total) by mouth daily. Start taking on: February 21, 2022   cyclobenzaprine 10 MG tablet Commonly known as: FLEXERIL Take 10 mg by mouth 3 (three) times daily as needed for muscle spasms.   enoxaparin 30 MG/0.3ML injection Commonly known as: LOVENOX Inject 0.3 mLs (30 mg total) into the skin daily for 26 days.   Fish Oil 1000 MG Caps Take 1.5 capsules by mouth 2 (two) times daily.   furosemide 20 MG  tablet Commonly known as: LASIX Take 20 mg by mouth daily as needed for edema or fluid.   gabapentin 300 MG capsule Commonly known as: NEURONTIN Take 300 mg by mouth 4 (four) times daily.   hydrALAZINE 50 MG tablet Commonly known as: APRESOLINE Take 1 tablet (50 mg total) by mouth 3 (three) times daily as needed (SBP >160).   hydrOXYzine 10 MG tablet Commonly known as: ATARAX TAKE 1 OR 2 TABLET BY MOUTH THREE TIMES DAILY AS NEEDED FOR ITCH   insulin aspart 100 UNIT/ML injection Commonly known as: novoLOG Inject into the skin 4 (four) times daily as needed for high blood sugar (with meals and at bedtime). Sliding scale   ipratropium 0.02 % nebulizer solution Commonly known as: ATROVENT Take 0.5 mg by nebulization every 6 (six) hours as needed for wheezing or shortness of breath.   iron polysaccharides 150 MG capsule Commonly known as: NIFEREX Take 1 capsule (150 mg total) by mouth daily.   Magnesium 400 MG Tabs Take 400 mg by mouth 2 (two) times daily for 7 days.   MULTIVITAMIN ADULT PO Take by mouth 2 (two) times daily.   oxyCODONE 5 MG immediate release tablet Commonly known as: Roxicodone Take 1 tablet (5 mg total) by mouth every 6 (six) hours as needed for up to 5 days for severe pain. What changed: when to take this   polyethylene glycol 17 g packet Commonly known as: MiraLax Take 17 g by mouth daily.   polyvinyl alcohol 1.4 % ophthalmic solution Commonly known as: LIQUIFILM TEARS Place 1 drop into both eyes as needed for dry eyes.   Probiotic 250 MG Caps Take 1 capsule by mouth 3 times/day as needed-between meals & bedtime.   rOPINIRole 1 MG tablet Commonly known as: REQUIP Take 1.5 mg by mouth at bedtime.   Vitamin D (Ergocalciferol) 1.25 MG (50000 UNIT) Caps capsule Commonly known as: DRISDOL Take 50,000 Units by mouth 2 (two) times a week.       Allergies  Allergen Reactions   Cat Hair Extract Shortness Of Breath   Pregabalin Anaphylaxis    Erythromycin Other (See Comments)    Severe stomach upset   Discharge Instructions     Call MD for:  difficulty breathing, headache or visual disturbances   Complete by: As directed    Call MD for:  extreme fatigue   Complete by: As directed    Call MD for:  persistant dizziness or light-headedness   Complete by: As directed    Call MD for:  persistant nausea and vomiting   Complete by: As directed    Call MD for:  severe uncontrolled pain   Complete by: As directed    Call MD for:  temperature >100.4   Complete by: As directed    Diet Carb Modified   Complete by: As directed    Discharge instructions   Complete by: As directed    Follow-up with PCP in 1 week.  Repeat iron profile, folic acid level, vitamin B12 level, vitamin D level after 3 months. Follow-up with orthopedic surgery in 1 week for postop check Follow-up with PCP for lung cancer screening.   Increase activity slowly   Complete by: As directed        The results of significant diagnostics from this hospitalization (including imaging, microbiology, ancillary and laboratory) are listed below for reference.    Significant Diagnostic Studies: US Venous Img Lower Bilateral (DVT)  Result Date: 02/14/2022 CLINICAL DATA:  Hip pain and recent internal fixation. Color changes with varicose veins. Anticoagulation therapy. EXAM: Bilateral LOWER EXTREMITY VENOUS DOPPLER ULTRASOUND TECHNIQUE: Gray-scale sonography with compression, as well as color and duplex ultrasound, were performed to evaluate the deep venous system(s) from the level of the common femoral vein through the popliteal and proximal calf veins. COMPARISON:  None Available. FINDINGS: VENOUS Normal compressibility of the common femoral, superficial femoral, and popliteal veins, as well as the visualized calf veins. Visualized portions of profunda femoral vein and great saphenous vein unremarkable. No filling defects to suggest DVT on grayscale or color Doppler imaging.  Doppler waveforms show normal direction of venous flow, normal respiratory plasticity and response to augmentation. OTHER None. Limitations: none IMPRESSION: No evidence of acute deep venous thrombosis in the visualized lower extremity veins. Electronically Signed   By: Burman Nieves M.D.   On: 02/14/2022 01:18   CT ABDOMEN PELVIS W CONTRAST  Result Date: 02/13/2022 CLINICAL DATA:  Abdominal trauma, blunt. She recently had hip surgery on that side on 1/29. When she stood up to walk today, she fell and started having severe pain in her right hip EXAM: CT ABDOMEN AND PELVIS WITH CONTRAST TECHNIQUE: Multidetector CT imaging of the abdomen and pelvis was performed using the standard protocol following bolus administration of intravenous contrast. RADIATION DOSE REDUCTION: This exam was performed according to the departmental dose-optimization program which includes automated exposure control, adjustment of the mA and/or kV according to patient size and/or use of iterative reconstruction technique. CONTRAST:  OMNIPAQUE IOHEXOL 300 MG/ML  SOLN COMPARISON:  None Available. FINDINGS: Lower chest: No acute abnormality. Hepatobiliary: Not enlarged. No focal lesion. No laceration or subcapsular hematoma. Status post cholecystectomy.  No biliary ductal dilatation. Pancreas: Diffusely atrophic. Diffuse coarse calcifications likely sequela of chronic pancreatitis. Normal pancreatic contour. No main pancreatic duct dilatation. Spleen: Not enlarged. No focal lesion. No laceration, subcapsular hematoma, or vascular injury. Adrenals/Urinary Tract: No nodularity bilaterally. Bilateral kidneys enhance symmetrically. Subcentimeter hypodensities are too small to characterize. No hydronephrosis. No contusion, laceration, or subcapsular hematoma. No injury to the vascular structures or collecting systems. No hydroureter. The urinary bladder is unremarkable. Stomach/Bowel: Rectosigmoid surgical changes. No small or large bowel  wall thickening or dilatation. The appendix is not definitely identified with no inflammatory changes in the right lower quadrant to suggest acute appendicitis. Vasculature/Lymphatics: Severe atherosclerotic plaque. No abdominal aorta or iliac aneurysm. No active contrast extravasation or pseudoaneurysm. No abdominal, pelvic, inguinal lymphadenopathy. Reproductive: Status post hysterectomy.  Pessary device noted. Other: No simple free fluid ascites. No pneumoperitoneum. No hemoperitoneum. No mesenteric hematoma identified. No organized fluid collection. Musculoskeletal: No significant soft tissue hematoma. No acute  pelvic fracture. No spinal fracture. Interval development of a healed right sacral fracture. Three screw fixation of a subacute right femoral neck fracture-no CT findings suggest surgical hardware complication. Partially visualized dynamic intramedullary nail fixation of the left proximal femur. Stable grade 2 anterolisthesis of L3 on L4. Levoscoliosis of the lumbar spine centered at the L3-L4 level with associated multilevel intervertebral disc space vacuum phenomenon. Chronic L5 vertebral body height loss. Ports and Devices: None. IMPRESSION: 1. No acute intra-abdominal or intrapelvic traumatic injury. 2. No acute displaced fracture or traumatic listhesis of the lumbar spine. 3. Three screw fixation of a subacute right femoral neck fracture-no CT findings suggest surgical hardware complication. 4. Interval development of a healed right sacral fracture. 5. Other imaging findings of potential clinical significance: Chronic pancreatitis. Interval development of a healed right sacral fracture. Stable grade 2 anterolisthesis of L3 on L4. Aortic Atherosclerosis (ICD10-I70.0). Electronically Signed   By: Iven Finn M.D.   On: 02/13/2022 22:12   DG Hip Unilat W or Wo Pelvis 2-3 Views Right  Result Date: 02/13/2022 CLINICAL DATA:  Fall with right hip pain, evaluate for fracture EXAM: DG HIP (WITH OR  WITHOUT PELVIS) 2-3V RIGHT COMPARISON:  None Available. FINDINGS: Status post ORIF for recent subcapital right femoral neck fracture with multiple screws left hip cephalomedullary nail placement. No new fracture. IMPRESSION: Status post ORIF for recent subcapital right femoral neck fracture with multiple screws. Left hip cephalomedullary nail placement. No new fracture. Electronically Signed   By: Keane Police D.O.   On: 02/13/2022 21:09   DG HIP UNILAT WITH PELVIS 2-3 VIEWS RIGHT  Result Date: 02/09/2022 CLINICAL DATA:  Elective surgery. EXAM: DG HIP (WITH OR WITHOUT PELVIS) 2-3V RIGHT COMPARISON:  Preoperative imaging. FINDINGS: Four fluoroscopic spot views of the right hip obtained in the operating room in frontal and cross-table lateral projections. Four cannulated screws traverse the femoral neck. Fluoroscopy time 46 seconds. Dose 4.8 mGy. IMPRESSION: Intraoperative fluoroscopy during right hip surgery. Electronically Signed   By: Keith Rake M.D.   On: 02/09/2022 16:03   DG C-Arm 1-60 Min-No Report  Result Date: 02/09/2022 Fluoroscopy was utilized by the requesting physician.  No radiographic interpretation.   CT Hip Right Wo Contrast  Result Date: 02/08/2022 CLINICAL DATA:  Right hip pain, possible proximal femoral fracture, initial encounter EXAM: CT OF THE RIGHT HIP WITHOUT CONTRAST TECHNIQUE: Multidetector CT imaging of the right hip was performed according to the standard protocol. Multiplanar CT image reconstructions were also generated. RADIATION DOSE REDUCTION: This exam was performed according to the departmental dose-optimization program which includes automated exposure control, adjustment of the mA and/or kV according to patient size and/or use of iterative reconstruction technique. COMPARISON:  Plain film from earlier in the same day. FINDINGS: Bones/Joint/Cartilage Mildly impacted subcapital femoral neck fracture is noted in the proximal right femur. No pelvic fracture is seen.  Mild right hip joint effusion is seen. Ligaments Suboptimally assessed by CT. Muscles and Tendons Surrounding musculature appears within normal limits. Soft tissues Pessary is noted in place. No other soft tissue abnormality is noted. IMPRESSION: Mildly impacted subcapital right femoral neck fracture. Electronically Signed   By: Inez Catalina M.D.   On: 02/08/2022 17:44   DG Ankle Complete Right  Result Date: 02/08/2022 CLINICAL DATA:  Right foot and leg swelling for last week and a half. EXAM: RIGHT FOOT COMPLETE - 3+ VIEW; RIGHT ANKLE - COMPLETE 3+ VIEW COMPARISON:  None Available. FINDINGS: Diffuse edema is present about the ankle. No definite  effusion is present. No acute or healing fracture is present. Chronic degenerative changes are again noted in the midfoot, greatest at the first and second TMT joints. Extensive soft tissue swelling extends over the foot. No acute osseous abnormality is present. IMPRESSION: 1. Extensive soft tissue swelling about the ankle and foot. This most likely represents edema or cellulitis. 2. No acute osseous abnormality. 3. Chronic degenerative changes in the foot, greatest at the first and second TMT joints. Electronically Signed   By: San Morelle M.D.   On: 02/08/2022 15:36   DG Foot Complete Right  Result Date: 02/08/2022 CLINICAL DATA:  Right foot and leg swelling for last week and a half. EXAM: RIGHT FOOT COMPLETE - 3+ VIEW; RIGHT ANKLE - COMPLETE 3+ VIEW COMPARISON:  None Available. FINDINGS: Diffuse edema is present about the ankle. No definite effusion is present. No acute or healing fracture is present. Chronic degenerative changes are again noted in the midfoot, greatest at the first and second TMT joints. Extensive soft tissue swelling extends over the foot. No acute osseous abnormality is present. IMPRESSION: 1. Extensive soft tissue swelling about the ankle and foot. This most likely represents edema or cellulitis. 2. No acute osseous abnormality. 3.  Chronic degenerative changes in the foot, greatest at the first and second TMT joints. Electronically Signed   By: San Morelle M.D.   On: 02/08/2022 15:36   DG Hip Unilat W or Wo Pelvis 2-3 Views Right  Result Date: 02/08/2022 CLINICAL DATA:  Right hip pain. EXAM: DG HIP (WITH OR WITHOUT PELVIS) 3V RIGHT COMPARISON:  None Available. FINDINGS: Right hip is located. Degenerative changes are present. No acute or healing fractures are present. Visualized pelvis is unremarkable. Left proximal femur ORIF noted. Degenerative changes are present in the lower lumbar spine. IMPRESSION: 1. Degenerative changes of the right hip without acute or healing fracture. If the patient has ongoing pain or is unable to bear weight, CT or MRI could be used for further evaluation. 2. Left proximal femur ORIF. Electronically Signed   By: San Morelle M.D.   On: 02/08/2022 15:31    Microbiology: Recent Results (from the past 240 hour(s))  Blood culture (routine x 2)     Status: None   Collection Time: 02/08/22  6:18 PM   Specimen: BLOOD  Result Value Ref Range Status   Specimen Description BLOOD RIGHT ANTECUBITAL  Final   Special Requests   Final    BOTTLES DRAWN AEROBIC AND ANAEROBIC Blood Culture adequate volume   Culture   Final    NO GROWTH 5 DAYS Performed at [facility], [address] [place], [place] [address]    Report Status 02/13/2022 FINAL  Final  Blood culture (routine x 2)     Status: None   Collection Time: 02/08/22  6:18 PM   Specimen: BLOOD  Result Value Ref Range Status   Specimen Description BLOOD LEFT ANTECUBITAL  Final   Special Requests   Final    BOTTLES DRAWN AEROBIC AND ANAEROBIC Blood Culture adequate volume   Culture   Final    NO GROWTH 5 DAYS Performed at [facility], [address] [place], [place] [address]    Report Status 02/13/2022 FINAL  Final  Culture, blood (Routine X 2) w Reflex to ID Panel     Status: None (Preliminary result)    Collection Time: 02/15/22  8:58 AM   Specimen: BLOOD  Result Value Ref Range Status   Specimen Description BLOOD BLOOD LEFT  HAND  Final   Special Requests   Final    BOTTLES DRAWN AEROBIC AND ANAEROBIC Blood Culture results may not be optimal due to an excessive volume of blood received in culture bottles   Culture   Final    NO GROWTH 2 DAYS Performed at [facility], [address] [place], [place] [address]    Report Status PENDING  Incomplete  Culture, blood (Routine X 2) w Reflex to ID Panel     Status: None (Preliminary result)   Collection Time: 02/15/22  9:05 AM   Specimen: BLOOD  Result Value Ref Range Status   Specimen Description BLOOD BLOOD RIGHT HAND  Final   Special Requests   Final    BOTTLES DRAWN AEROBIC AND ANAEROBIC BLOOD RIGHT HAND   Culture   Final    NO GROWTH 2 DAYS Performed at [facility], [address] [place], [place] [address]    Report Status PENDING  Incomplete     Labs: CBC: Recent Labs  Lab 02/13/22 2016 02/14/22 0452 02/15/22 0549 02/16/22 0430 02/17/22 0959  WBC 10.8* 9.0 7.1 5.6 6.4  NEUTROABS 7.1  --   --   --   --   HGB 11.7* 11.5* 9.9* 9.3* 9.8*  HCT 36.4 35.3* 31.0* 28.7* 30.6*  MCV 92.4 90.1 90.9 90.5 90.8  PLT 256 255 267 287 [id number]   Basic Metabolic Panel: Recent Labs  Lab 02/13/22 2016 02/14/22 0452 02/14/22 0750 02/15/22 0549 02/16/22 0430 02/17/22 0959  NA 134* 132*  --  131* 136 133*  K 4.1 3.8  --  4.0 3.8 3.8  CL 101 100  --  101 102 101  CO2 23 22  --  20* 26 23  GLUCOSE 46* 126*  --  237* 190* 291*  BUN 17 15  --  15 14 14   CREATININE 0.60 0.59  --  0.69 0.54 0.62  CALCIUM 8.8* 8.1*  --  8.0* 8.1* 8.4*  MG  --   --  1.9 1.8 2.0 1.5*  PHOS  --   --  3.7 2.4* 2.9 2.8   Liver Function Tests: Recent Labs  Lab 02/14/22 0452  AST 19  ALT 16  ALKPHOS 93  BILITOT 0.7  PROT 6.1*  ALBUMIN 3.2*   No results for input(s): "LIPASE", "AMYLASE" in the last 168 hours. No results for  input(s): "AMMONIA" in the last 168 hours. Cardiac Enzymes: No results for input(s): "CKTOTAL", "CKMB", "CKMBINDEX", "TROPONINI" in the last 168 hours. BNP (last 3 results) No results for input(s): "BNP" in the last 8760 hours. CBG: Recent Labs  Lab 02/16/22 1137 02/16/22 1710 02/16/22 2117 02/17/22 0828 02/17/22 1154  GLUCAP 332* 241* 196* 298* 257*    Time spent: 35 minutes  Signed:  Val Riles  [facility]  02/17/2022 12:35 PM

## 2022-02-17 NOTE — Progress Notes (Signed)
Patient being sent out via wheelchair with belongings to daughters waiting car

## 2022-02-17 NOTE — TOC Transition Note (Signed)
Transition of Care [facility]) - CM/SW Discharge Note   Patient Details  Name: Suzanne Snyder MRN: [id number] Date of Birth: Apr 14, 1950  Transition of Care [facility]) CM/SW Contact:  Beverly Sessions, RN Phone Number: 02/17/2022, 1:31 PM   Clinical Narrative:    Patient will DC to: [facility] Anticipated DC date: 02/17/22  Family notified: Daughter Suzanne Snyder Transport by: Daughter   Per MD patient ready for DC to . RN, patient, patient's family, and facility notified of DC. Discharge Summary sent to facility. RN given number for report. TOC signing off.  Isaias Cowman [facility] [phone]         Patient Goals and CMS Choice      Discharge Placement                         Discharge Plan and Services Additional resources added to the After Visit Summary for                                       Social Determinants of Health (SDOH) Interventions SDOH Screenings   Food Insecurity: No Food Insecurity (02/14/2022)  Housing: Low Risk  (02/14/2022)  Transportation Needs: No Transportation Needs (02/14/2022)  Utilities: Not At Risk (02/14/2022)  Tobacco Use: High Risk (02/13/2022)     Readmission Risk Interventions     No data to display

## 2022-02-20 LAB — CULTURE, BLOOD (ROUTINE X 2)
Culture: NO GROWTH
Culture: NO GROWTH

## 2022-03-10 ENCOUNTER — Encounter: Payer: Self-pay | Admitting: Student in an Organized Health Care Education/Training Program

## 2022-03-10 ENCOUNTER — Ambulatory Visit
Payer: Medicare Other | Attending: Student in an Organized Health Care Education/Training Program | Admitting: Student in an Organized Health Care Education/Training Program

## 2022-03-10 VITALS — BP 117/56 | HR 77 | Temp 98.2°F | Resp 16 | Ht 68.0 in | Wt 105.0 lb

## 2022-03-10 DIAGNOSIS — S72144S Nondisplaced intertrochanteric fracture of right femur, sequela: Secondary | ICD-10-CM | POA: Insufficient documentation

## 2022-03-10 DIAGNOSIS — Z8781 Personal history of (healed) traumatic fracture: Secondary | ICD-10-CM

## 2022-03-10 DIAGNOSIS — M25551 Pain in right hip: Secondary | ICD-10-CM

## 2022-03-10 DIAGNOSIS — Z87311 Personal history of (healed) other pathological fracture: Secondary | ICD-10-CM

## 2022-03-10 DIAGNOSIS — Z9889 Other specified postprocedural states: Secondary | ICD-10-CM | POA: Diagnosis present

## 2022-03-10 MED ORDER — TRAMADOL HCL 50 MG PO TABS: 50.0000 mg | ORAL_TABLET | Freq: Two times a day (BID) | ORAL | 2 refills | Status: DC | PRN

## 2022-03-10 NOTE — Progress Notes (Signed)
PROVIDER NOTE: Information contained herein reflects review and annotations entered in association with encounter. Interpretation of such information and data should be left to medically-trained personnel. Information provided to patient can be located elsewhere in the medical record under "Patient Instructions". Document created using STT-dictation technology, any transcriptional errors that may result from process are unintentional.    Patient: Suzanne Snyder  Service Category: E/M  Provider: Gillis Santa, MD  DOB: [birth year]  DOS: 03/10/2022  Referring Provider: Baxter Hire, MD  MRN: [id number]  Specialty: Interventional Pain Management  PCP: Baxter Hire, MD  Type: Established Patient  Setting: Ambulatory outpatient    Location: Office  Delivery: Face-to-face     HPI  [name], a 72 y.o. year old female, is here today because of her Right hip pain [M25.551]. [name] primary complain today is Back Pain, Hip Pain (right), Knee Pain (left), Foot Pain (right), and Shoulder Pain (left) Last encounter: My last encounter with her was on 12/29/2021.  Pain Assessment: Severity of Chronic pain is reported as a 7 /10. Location: Back Right/radiates down the back of right leg to toes. Onset: More than a month ago. Quality: Aching. Timing: Constant. Modifying factor(s): medication. Vitals:  height is '5\' 8"'$  (1.727 m) and weight is 105 lb (47.6 kg). Her temperature is 98.2 F (36.8 C). Her blood pressure is 117/56 (abnormal) and her pulse is 77. Her respiration is 16 and oxygen saturation is 96%.  BMI: Estimated body mass index is 15.97 kg/m as calculated from the following:   Height as of this encounter: '5\' 8"'$  (1.727 m).   Weight as of this encounter: 105 lb (47.6 kg).  Reason for encounter: medication management.   Unfortunately, patient lost her husband New Year's Eve.  She states that she also sustained a right femur fracture, nontraumatic, at the end of January.  She was also  having increased right ankle and foot swelling at that time.  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  She went on to have an open reduction and internal fixation of her right femoral head neck fracture.  She was treated with ceftriaxone for right ankle cellulitis.  She is recovering from her surgery and states that the tramadol that she was taking for chronic pain is not as helpful.  We discussed increasing her dose to 50 to 100 mg every 12 hours as needed and patient feels that that would be better for pain management.  Pharmacotherapy Assessment  Analgesic: Tramadol 50 mg to 100 mg every 12 hours as needed  Monitoring: [place] PMP: PDMP reviewed during this encounter.       Pharmacotherapy: No side-effects or adverse reactions reported. Compliance: No problems identified. Effectiveness: Clinically acceptable.  Dewayne Shorter, RN  03/10/2022  1:05 PM  Sign when Signing Visit Nursing Pain Medication Assessment:  Safety precautions to be maintained throughout the outpatient stay will include: orient to surroundings, keep bed in low position, maintain call bell within reach at all times, provide assistance with transfer out of bed and ambulation.  Medication Inspection Compliance: Pill count conducted under aseptic conditions, in front of the patient. Neither the pills nor the bottle was removed from the patient's sight at any time. Once count was completed pills were immediately returned to the patient in their original bottle.  Medication: Tramadol (Ultram) Pill/Patch Count:  52 of 60 pills remain Pill/Patch Appearance:  Markings consistent with prescribed medication Bottle Appearance: Standard pharmacy container. Clearly labeled. Filled Date: 02 / 23 / 2024 Last Medication intake:  Today    No results found for: "CBDTHCR" No results found for: "D8THCCBX" No results found for: "D9THCCBX"  UDS:  Summary   Date Value Ref Range Status  11/27/2021 Note  Final    Comment:    ==================================================================== Compliance Drug Analysis, Ur ==================================================================== Test                             Result       Flag       Units  Drug Present and Declared for Prescription Verification   7-aminoclonazepam              267          EXPECTED   ng/mg creat    7-aminoclonazepam is an expected metabolite of clonazepam. Source of    clonazepam is a scheduled prescription medication.    Gabapentin                     PRESENT      EXPECTED   Cyclobenzaprine                PRESENT      EXPECTED   Desmethylcyclobenzaprine       PRESENT      EXPECTED    Desmethylcyclobenzaprine is an expected metabolite of    cyclobenzaprine.  Drug Present not Declared for Prescription Verification   Tramadol                       6697         UNEXPECTED ng/mg creat   O-Desmethyltramadol            3727         UNEXPECTED ng/mg creat   N-Desmethyltramadol            1160         UNEXPECTED ng/mg creat    Source of tramadol is a prescription medication. O-desmethyltramadol    and N-desmethyltramadol are expected metabolites of tramadol.    Acetaminophen                  PRESENT      UNEXPECTED  Drug Absent but Declared for Prescription Verification   Doxepin                        Not Detected UNEXPECTED   Salicylate                     Not Detected UNEXPECTED    Aspirin, as indicated in the declared medication list, is not always    detected even when used as directed.    Diphenhydramine                Not Detected UNEXPECTED   Hydroxyzine                    Not Detected UNEXPECTED ==================================================================== Test                      Result    Flag   Units      Ref Range   Creatinine              30  mg/dL       >=20 ==================================================================== Declared Medications:  The flagging and interpretation on this report are based on the  following declared medications.  Unexpected results may arise from  inaccuracies in the declared medications.   **Note: The testing scope of this panel includes these medications:   Clonazepam (Klonopin)  Cyclobenzaprine (Flexeril)  Diphenhydramine  Doxepin (Sinequan)  Gabapentin (Neurontin)  Hydroxyzine (Atarax)   **Note: The testing scope of this panel does not include small to  moderate amounts of these reported medications:   Aspirin   **Note: The testing scope of this panel does not include the  following reported medications:   Albuterol (Ventolin HFA)  Ammonium Hydroxide (Ammonium Lactate)  Celecoxib (Celebrex)  Clobetasol (Temovate)  Fish Oil  Insulin (NovoLog)  Ipratropium (Atrovent)  Lactic Acid (Ammonium Lactate)  Mometasone  Multivitamin  Ropinirole (Requip)  Triamcinolone (Kenalog)  Vitamin D2 (Drisdol) ==================================================================== For clinical consultation, please call [phone]. ====================================================================       ROS  Constitutional: Denies any fever or chills Gastrointestinal: No reported hemesis, hematochezia, vomiting, or acute GI distress Musculoskeletal:  Right hip pain Neurological: No reported episodes of acute onset apraxia, aphasia, dysarthria, agnosia, amnesia, paralysis, loss of coordination, or loss of consciousness  Medication Review  Fish Oil, Multiple Vitamin, Probiotic, Vitamin D (Ergocalciferol), acetaminophen, albuterol, amLODipine, bisacodyl, clonazePAM, cyanocobalamin, cyclobenzaprine, diphenhydrAMINE HCl, furosemide, gabapentin, hydrALAZINE, hydrOXYzine, insulin aspart, ipratropium, iron polysaccharides, polyethylene glycol, polyvinyl alcohol, rOPINIRole, and traMADol  History Review   Allergy: Ms. Alphonse is allergic to cat hair extract, pregabalin, and erythromycin. Drug: Ms. Schnapp  reports no history of drug use. Alcohol:  reports no history of alcohol use. Tobacco:  reports that she has been smoking cigarettes. She has a 50.00 pack-year smoking history. She has never used smokeless tobacco. Social: Ms. Pimm  reports that she has been smoking cigarettes. She has a 50.00 pack-year smoking history. She has never used smokeless tobacco. She reports that she does not drink alcohol and does not use drugs. Medical:  has a past medical history of Anxiety, Arthritis, Asthma, Diabetes mellitus without complication ([place]), Insulin pump in place, Osteoporosis, RLS (restless legs syndrome), Smokers' cough ([place]), and Wears dentures. Surgical: Ms. Santamarina  has a past surgical history that includes Joint replacement (Left, 2007); Spine surgery (N/A, 1984); Rotator cuff repair (Left, 2014 and 2015); Thyroid surgery; Cataract extraction w/PHACO (Right, 02/20/2020); Cataract extraction w/PHACO (Left, 03/05/2020); and Hip pinning, cannulated (Right, 02/09/2022). Family: family history includes Parkinsonism in her father.  Laboratory Chemistry Profile   Renal Lab Results  Component Value Date   BUN 14 02/17/2022   CREATININE 0.62 02/17/2022   GFRAA >60 01/19/2018   GFRNONAA >60 02/17/2022    Hepatic Lab Results  Component Value Date   AST 19 02/14/2022   ALT 16 02/14/2022   ALBUMIN 3.2 (L) 02/14/2022   ALKPHOS 93 02/14/2022    Electrolytes Lab Results  Component Value Date   NA 133 (L) 02/17/2022   K 3.8 02/17/2022   CL 101 02/17/2022   CALCIUM 8.4 (L) 02/17/2022   MG 1.5 (L) 02/17/2022   PHOS 2.8 02/17/2022    Bone No results found for: "VD25OH", "VD125OH2TOT", "PT:8287811", "UK:060616", "25OHVITD1", "25OHVITD2", "25OHVITD3", "TESTOFREE", "TESTOSTERONE"  Inflammation (CRP: Acute Phase) (ESR: Chronic Phase) Lab Results  Component Value Date   ESRSEDRATE 42 (H) 12/21/2020    LATICACIDVEN 1.0 02/08/2022         Note: Above Lab results reviewed.  Recent Imaging Review  US Venous Img Lower Bilateral (DVT)  CLINICAL DATA:  Hip pain and recent internal fixation. Color changes with varicose veins. Anticoagulation therapy.  EXAM: Bilateral LOWER EXTREMITY VENOUS DOPPLER ULTRASOUND  TECHNIQUE: Gray-scale sonography with compression, as well as color and duplex ultrasound, were performed to evaluate the deep venous system(s) from the level of the common femoral vein through the popliteal and proximal calf veins.  COMPARISON:  None Available.  FINDINGS: VENOUS  Normal compressibility of the common femoral, superficial femoral, and popliteal veins, as well as the visualized calf veins. Visualized portions of profunda femoral vein and great saphenous vein unremarkable. No filling defects to suggest DVT on grayscale or color Doppler imaging. Doppler waveforms show normal direction of venous flow, normal respiratory plasticity and response to augmentation.  OTHER  None.  Limitations: none  IMPRESSION: No evidence of acute deep venous thrombosis in the visualized lower extremity veins.  Electronically Signed   By: Lucienne Capers M.D.   On: 02/14/2022 01:18 Note: Reviewed        Physical Exam  General appearance: Well nourished, well developed, and well hydrated. In no apparent acute distress Mental status: Alert, oriented x 3 (person, place, & time)       Respiratory: No evidence of acute respiratory distress Eyes: PERLA Vitals: BP (!) 117/56   Pulse 77   Temp 98.2 F (36.8 C)   Resp 16   Ht '5\' 8"'$  (1.727 m)   Wt 105 lb (47.6 kg)   SpO2 96%   BMI 15.97 kg/m  BMI: Estimated body mass index is 15.97 kg/m as calculated from the following:   Height as of this encounter: '5\' 8"'$  (1.727 m).   Weight as of this encounter: 105 lb (47.6 kg). Ideal: Ideal body weight: 63.9 kg (140 lb 14 oz)  Right hip pain, worse with hip flexion and internal  rotation  Assessment   Diagnosis Status  1. Right hip pain   2. Closed nondisplaced intertrochanteric fracture of right femur, sequela   3. S/P ORIF (open reduction internal fixation) fracture- RIGHT hip    Controlled Controlled Controlled    Plan of Care    [name] has a current medication list which includes the following long-term medication(s): albuterol, amlodipine, clonazepam, diphenhydramine hcl, furosemide, gabapentin, hydralazine, insulin aspart, ipratropium, iron polysaccharides, and ropinirole.  Pharmacotherapy (Medications Ordered): Meds ordered this encounter  Medications   traMADol (ULTRAM) 50 MG tablet    Sig: Take 1-2 tablets (50-100 mg total) by mouth 2 (two) times daily as needed.    Dispense:  120 tablet    Refill:  2   Continue with multimodal analgesics as prescribed  Follow-up plan:   Return in about 3 months (around 06/08/2022) for Medication Management, in person.      Recent Visits Date Type Provider Dept  12/29/21 Office Visit Gillis Santa, MD [facility]  Showing recent visits within past 90 days and meeting all other requirements Today's Visits Date Type Provider Dept  03/10/22 Office Visit Gillis Santa, MD [facility]  Showing today's visits and meeting all other requirements Future Appointments Date Type Provider Dept  05/26/22 Appointment Gillis Santa, MD [facility]  Showing future appointments within next 90 days and meeting all other requirements  I discussed the assessment and treatment plan with the patient. The patient was provided an opportunity to ask questions and all were answered. The patient agreed with the plan and demonstrated an understanding of the instructions.  Patient advised to call back or seek an in-person evaluation  if the symptoms or condition worsens.  Duration of encounter: 51mnutes.  Total time on encounter, as per AMA guidelines included both the face-to-face and  non-face-to-face time personally spent by the physician and/or other qualified health care professional(s) on the day of the encounter (includes time in activities that require the physician or other qualified health care professional and does not include time in activities normally performed by clinical staff). Physician's time may include the following activities when performed: Preparing to see the patient (e.g., pre-charting review of records, searching for previously ordered imaging, lab work, and nerve conduction tests) Review of prior analgesic pharmacotherapies. Reviewing PMP Interpreting ordered tests (e.g., lab work, imaging, nerve conduction tests) Performing post-procedure evaluations, including interpretation of diagnostic procedures Obtaining and/or reviewing separately obtained history Performing a medically appropriate examination and/or evaluation Counseling and educating the patient/family/caregiver Ordering medications, tests, or procedures Referring and communicating with other health care professionals (when not separately reported) Documenting clinical information in the electronic or other health record Independently interpreting results (not separately reported) and communicating results to the patient/ family/caregiver Care coordination (not separately reported)  Note by: BGillis Santa MD Date: 03/10/2022; Time: 3:01 PM

## 2022-03-10 NOTE — Progress Notes (Signed)
Nursing Pain Medication Assessment:  Safety precautions to be maintained throughout the outpatient stay will include: orient to surroundings, keep bed in low position, maintain call bell within reach at all times, provide assistance with transfer out of bed and ambulation.  Medication Inspection Compliance: Pill count conducted under aseptic conditions, in front of the patient. Neither the pills nor the bottle was removed from the patient's sight at any time. Once count was completed pills were immediately returned to the patient in their original bottle.  Medication: Tramadol (Ultram) Pill/Patch Count:  52 of 60 pills remain Pill/Patch Appearance: Markings consistent with prescribed medication Bottle Appearance: Standard pharmacy container. Clearly labeled. Filled Date: 02 / 23 / 2024 Last Medication intake:  Today

## 2022-03-26 ENCOUNTER — Encounter: Payer: Medicare Other | Admitting: Student in an Organized Health Care Education/Training Program

## 2022-05-06 ENCOUNTER — Emergency Department: Payer: Medicare Other

## 2022-05-06 ENCOUNTER — Inpatient Hospital Stay: Payer: Medicare Other

## 2022-05-06 ENCOUNTER — Inpatient Hospital Stay
Admission: EM | Admit: 2022-05-06 | Discharge: 2022-05-11 | DRG: 388 | Disposition: A | Discharge status: Home / Self Care | Payer: Medicare Other | Attending: Internal Medicine | Admitting: Internal Medicine

## 2022-05-06 ENCOUNTER — Encounter: Payer: Self-pay | Admitting: Family Medicine

## 2022-05-06 ENCOUNTER — Other Ambulatory Visit: Payer: Self-pay

## 2022-05-06 DIAGNOSIS — Z9049 Acquired absence of other specified parts of digestive tract: Secondary | ICD-10-CM | POA: Diagnosis not present

## 2022-05-06 DIAGNOSIS — M810 Age-related osteoporosis without current pathological fracture: Secondary | ICD-10-CM | POA: Diagnosis present

## 2022-05-06 DIAGNOSIS — Z79899 Other long term (current) drug therapy: Secondary | ICD-10-CM

## 2022-05-06 DIAGNOSIS — F17210 Nicotine dependence, cigarettes, uncomplicated: Secondary | ICD-10-CM | POA: Diagnosis present

## 2022-05-06 DIAGNOSIS — E10649 Type 1 diabetes mellitus with hypoglycemia without coma: Secondary | ICD-10-CM | POA: Diagnosis present

## 2022-05-06 DIAGNOSIS — Z720 Tobacco use: Secondary | ICD-10-CM | POA: Diagnosis present

## 2022-05-06 DIAGNOSIS — J45909 Unspecified asthma, uncomplicated: Secondary | ICD-10-CM | POA: Diagnosis present

## 2022-05-06 DIAGNOSIS — R54 Age-related physical debility: Secondary | ICD-10-CM | POA: Diagnosis present

## 2022-05-06 DIAGNOSIS — G2581 Restless legs syndrome: Secondary | ICD-10-CM | POA: Diagnosis present

## 2022-05-06 DIAGNOSIS — R112 Nausea with vomiting, unspecified: Secondary | ICD-10-CM

## 2022-05-06 DIAGNOSIS — E1069 Type 1 diabetes mellitus with other specified complication: Secondary | ICD-10-CM

## 2022-05-06 DIAGNOSIS — I81 Portal vein thrombosis: Secondary | ICD-10-CM | POA: Diagnosis present

## 2022-05-06 DIAGNOSIS — E871 Hypo-osmolality and hyponatremia: Secondary | ICD-10-CM | POA: Diagnosis present

## 2022-05-06 DIAGNOSIS — G8929 Other chronic pain: Secondary | ICD-10-CM | POA: Diagnosis not present

## 2022-05-06 DIAGNOSIS — I871 Compression of vein: Secondary | ICD-10-CM | POA: Diagnosis present

## 2022-05-06 DIAGNOSIS — Z9641 Presence of insulin pump (external) (internal): Secondary | ICD-10-CM | POA: Diagnosis present

## 2022-05-06 DIAGNOSIS — R64 Cachexia: Secondary | ICD-10-CM | POA: Diagnosis present

## 2022-05-06 DIAGNOSIS — K56609 Unspecified intestinal obstruction, unspecified as to partial versus complete obstruction: Secondary | ICD-10-CM | POA: Diagnosis not present

## 2022-05-06 DIAGNOSIS — K565 Intestinal adhesions [bands], unspecified as to partial versus complete obstruction: Secondary | ICD-10-CM | POA: Diagnosis present

## 2022-05-06 DIAGNOSIS — E860 Dehydration: Secondary | ICD-10-CM | POA: Diagnosis present

## 2022-05-06 DIAGNOSIS — E1043 Type 1 diabetes mellitus with diabetic autonomic (poly)neuropathy: Secondary | ICD-10-CM | POA: Diagnosis present

## 2022-05-06 DIAGNOSIS — Z716 Tobacco abuse counseling: Secondary | ICD-10-CM

## 2022-05-06 DIAGNOSIS — Z681 Body mass index (BMI) 19 or less, adult: Secondary | ICD-10-CM | POA: Diagnosis not present

## 2022-05-06 DIAGNOSIS — R1010 Upper abdominal pain, unspecified: Principal | ICD-10-CM

## 2022-05-06 DIAGNOSIS — R109 Unspecified abdominal pain: Secondary | ICD-10-CM | POA: Diagnosis present

## 2022-05-06 DIAGNOSIS — Z881 Allergy status to other antibiotic agents status: Secondary | ICD-10-CM | POA: Diagnosis not present

## 2022-05-06 LAB — COMPREHENSIVE METABOLIC PANEL
ALT: 16 U/L (ref 0–44)
AST: 14 U/L — ABNORMAL LOW (ref 15–41)
Albumin: 3.8 g/dL (ref 3.5–5.0)
Alkaline Phosphatase: 90 U/L (ref 38–126)
Anion gap: 12 (ref 5–15)
BUN: 32 mg/dL — ABNORMAL HIGH (ref 8–23)
CO2: 26 mmol/L (ref 22–32)
Calcium: 9.6 mg/dL (ref 8.9–10.3)
Chloride: 95 mmol/L — ABNORMAL LOW (ref 98–111)
Creatinine, Ser: 0.84 mg/dL (ref 0.44–1.00)
GFR, Estimated: >60 mL/min (ref >60)
Glucose, Bld: 153 mg/dL — ABNORMAL HIGH (ref 70–99)
Potassium: 4.3 mmol/L (ref 3.5–5.1)
Sodium: 133 mmol/L — ABNORMAL LOW (ref 135–145)
Total Bilirubin: 0.7 mg/dL (ref 0.3–1.2)
Total Protein: 7.1 g/dL (ref 6.5–8.1)

## 2022-05-06 LAB — LIPASE, BLOOD: Lipase: 19 U/L (ref 11–51)

## 2022-05-06 LAB — CBC WITH DIFFERENTIAL/PLATELET
Abs Immature Granulocytes: 0.06 10*3/uL (ref 0.00–0.07)
Basophils Absolute: 0 10*3/uL (ref 0.0–0.1)
Basophils Relative: 0 %
Eosinophils Absolute: 0.1 10*3/uL (ref 0.0–0.5)
Eosinophils Relative: 1 %
HCT: 42.4 % (ref 36.0–46.0)
Hemoglobin: 13.9 g/dL (ref 12.0–15.0)
Immature Granulocytes: 1 %
Lymphocytes Relative: 13 %
Lymphs Abs: 1.7 10*3/uL (ref 0.7–4.0)
MCH: 29.3 pg (ref 26.0–34.0)
MCHC: 32.8 g/dL (ref 30.0–36.0)
MCV: 89.3 fL (ref 80.0–100.0)
Monocytes Absolute: 1 10*3/uL (ref 0.1–1.0)
Monocytes Relative: 8 %
Neutro Abs: 9.9 10*3/uL — ABNORMAL HIGH (ref 1.7–7.7)
Neutrophils Relative %: 77 %
Platelets: 360 10*3/uL (ref 150–400)
RBC: 4.75 MIL/uL (ref 3.87–5.11)
RDW: 16.3 % — ABNORMAL HIGH (ref 11.5–15.5)
WBC: 12.8 10*3/uL — ABNORMAL HIGH (ref 4.0–10.5)
nRBC: 0 % (ref 0.0–0.2)

## 2022-05-06 LAB — PROTIME-INR
INR: 1.1 (ref 0.8–1.2)
Prothrombin Time: 14.1 seconds (ref 11.4–15.2)

## 2022-05-06 LAB — URINE DRUG SCREEN, QUALITATIVE (ARMC ONLY)
Amphetamines, Ur Screen: NOT DETECTED
Barbiturates, Ur Screen: NOT DETECTED
Benzodiazepine, Ur Scrn: NOT DETECTED
Cannabinoid 50 Ng, Ur ~~LOC~~: NOT DETECTED
Cocaine Metabolite,Ur ~~LOC~~: NOT DETECTED
MDMA (Ecstasy)Ur Screen: NOT DETECTED
Methadone Scn, Ur: NOT DETECTED
Opiate, Ur Screen: NOT DETECTED
Phencyclidine (PCP) Ur S: NOT DETECTED

## 2022-05-06 LAB — TROPONIN I (HIGH SENSITIVITY)
Troponin I (High Sensitivity): 4 ng/L (ref <18)
Troponin I (High Sensitivity): 4 ng/L (ref <18)

## 2022-05-06 LAB — URINALYSIS, ROUTINE W REFLEX MICROSCOPIC
Bilirubin Urine: NEGATIVE
Glucose, UA: NEGATIVE mg/dL
Hgb urine dipstick: NEGATIVE
Ketones, ur: NEGATIVE mg/dL
Leukocytes,Ua: NEGATIVE
Nitrite: NEGATIVE
Protein, ur: >=300 mg/dL — AB
Specific Gravity, Urine: >1.046 — ABNORMAL HIGH (ref 1.005–1.030)
pH: 6 (ref 5.0–8.0)

## 2022-05-06 LAB — CBG MONITORING, ED
Glucose-Capillary: 84 mg/dL (ref 70–99)
Glucose-Capillary: 151 mg/dL — ABNORMAL HIGH (ref 70–99)

## 2022-05-06 LAB — BETA-HYDROXYBUTYRIC ACID: Beta-Hydroxybutyric Acid: 0.38 mmol/L — ABNORMAL HIGH (ref 0.05–0.27)

## 2022-05-06 LAB — ETHANOL: Alcohol, Ethyl (B): <10 mg/dL (ref <10)

## 2022-05-06 LAB — ANTITHROMBIN III: AntiThromb III Func: 104 % (ref 75–120)

## 2022-05-06 LAB — LACTIC ACID, PLASMA: Lactic Acid, Venous: 1.9 mmol/L (ref 0.5–1.9)

## 2022-05-06 LAB — GLUCOSE, CAPILLARY: Glucose-Capillary: 165 mg/dL — ABNORMAL HIGH (ref 70–99)

## 2022-05-06 MED ORDER — NICOTINE 21 MG/24HR TD PT24
21.0000 mg | MEDICATED_PATCH | Freq: Every day | TRANSDERMAL | Status: DC
  Administered 2022-05-06 – 2022-05-11 (×6): 21 mg via TRANSDERMAL
  Filled 2022-05-06 (×6): qty 1

## 2022-05-06 MED ORDER — ONDANSETRON HCL 4 MG/2ML IJ SOLN
4.0000 mg | Freq: Once | INTRAMUSCULAR | Status: AC
  Administered 2022-05-06: 4 mg via INTRAVENOUS
  Filled 2022-05-06: qty 2

## 2022-05-06 MED ORDER — HYDROMORPHONE HCL 1 MG/ML IJ SOLN
0.5000 mg | INTRAMUSCULAR | Status: DC | PRN
  Administered 2022-05-06 – 2022-05-10 (×17): 0.5 mg via INTRAVENOUS
  Filled 2022-05-06 (×18): qty 0.5

## 2022-05-06 MED ORDER — ONDANSETRON HCL 4 MG PO TABS: 4.0000 mg | ORAL_TABLET | Freq: Four times a day (QID) | ORAL | Status: DC | PRN

## 2022-05-06 MED ORDER — LIDOCAINE VISCOUS HCL 2 % MT SOLN
15.0000 mL | Freq: Once | OROMUCOSAL | Status: AC
  Administered 2022-05-06: 15 mL via ORAL
  Filled 2022-05-06: qty 15

## 2022-05-06 MED ORDER — LORAZEPAM 2 MG/ML IJ SOLN
0.5000 mg | Freq: Three times a day (TID) | INTRAMUSCULAR | Status: DC | PRN
  Administered 2022-05-07: 0.5 mg via INTRAVENOUS
  Filled 2022-05-06: qty 1

## 2022-05-06 MED ORDER — INSULIN ASPART 100 UNIT/ML IJ SOLN
0.0000 [IU] | INTRAMUSCULAR | Status: DC
  Administered 2022-05-06: 2 [IU] via SUBCUTANEOUS
  Administered 2022-05-08: 3 [IU] via SUBCUTANEOUS
  Administered 2022-05-08: 7 [IU] via SUBCUTANEOUS
  Administered 2022-05-09: 3 [IU] via SUBCUTANEOUS
  Administered 2022-05-09: 7 [IU] via SUBCUTANEOUS
  Administered 2022-05-09: 3 [IU] via SUBCUTANEOUS
  Administered 2022-05-09: 5 [IU] via SUBCUTANEOUS
  Administered 2022-05-09: 2 [IU] via SUBCUTANEOUS
  Administered 2022-05-10: 9 [IU] via SUBCUTANEOUS
  Administered 2022-05-10 (×2): 3 [IU] via SUBCUTANEOUS
  Administered 2022-05-10: 2 [IU] via SUBCUTANEOUS
  Administered 2022-05-11: 5 [IU] via SUBCUTANEOUS
  Filled 2022-05-06 (×16): qty 1

## 2022-05-06 MED ORDER — ENOXAPARIN SODIUM 60 MG/0.6ML IJ SOSY
1.0000 mg/kg | PREFILLED_SYRINGE | Freq: Two times a day (BID) | INTRAMUSCULAR | Status: DC
  Administered 2022-05-06 – 2022-05-07 (×2): 50 mg via SUBCUTANEOUS
  Filled 2022-05-06 (×3): qty 0.6

## 2022-05-06 MED ORDER — SODIUM CHLORIDE 0.9 % IV BOLUS
1000.0000 mL | Freq: Once | INTRAVENOUS | Status: AC
  Administered 2022-05-06: 1000 mL via INTRAVENOUS

## 2022-05-06 MED ORDER — ONDANSETRON HCL 4 MG/2ML IJ SOLN
4.0000 mg | Freq: Four times a day (QID) | INTRAMUSCULAR | Status: DC | PRN
  Administered 2022-05-06 – 2022-05-08 (×3): 4 mg via INTRAVENOUS
  Filled 2022-05-06 (×3): qty 2

## 2022-05-06 MED ORDER — FENTANYL CITRATE PF 50 MCG/ML IJ SOSY
50.0000 ug | PREFILLED_SYRINGE | Freq: Once | INTRAMUSCULAR | Status: AC
  Administered 2022-05-06: 50 ug via INTRAVENOUS
  Filled 2022-05-06: qty 1

## 2022-05-06 MED ORDER — PANTOPRAZOLE SODIUM 40 MG IV SOLR
40.0000 mg | Freq: Once | INTRAVENOUS | Status: AC
  Administered 2022-05-06: 40 mg via INTRAVENOUS
  Filled 2022-05-06: qty 10

## 2022-05-06 MED ORDER — IOHEXOL 300 MG/ML  SOLN
100.0000 mL | Freq: Once | INTRAMUSCULAR | Status: AC | PRN
  Administered 2022-05-06: 80 mL via INTRAVENOUS

## 2022-05-06 MED ORDER — ALUM & MAG HYDROXIDE-SIMETH 200-200-20 MG/5ML PO SUSP
30.0000 mL | Freq: Once | ORAL | Status: AC
  Administered 2022-05-06: 30 mL via ORAL
  Filled 2022-05-06: qty 30

## 2022-05-06 MED ORDER — PANTOPRAZOLE SODIUM 40 MG PO TBEC: 40.0000 mg | DELAYED_RELEASE_TABLET | Freq: Every day | ORAL | 2 refills | Status: AC

## 2022-05-06 MED ORDER — ENOXAPARIN SODIUM 40 MG/0.4ML IJ SOSY: 40.0000 mg | PREFILLED_SYRINGE | INTRAMUSCULAR | Status: DC

## 2022-05-06 MED ORDER — SODIUM CHLORIDE 0.9 % IV SOLN: INTRAVENOUS | Status: DC

## 2022-05-06 NOTE — Assessment & Plan Note (Signed)
SSI

## 2022-05-06 NOTE — ED Provider Notes (Addendum)
[facility] Provider Note    Event Date/Time   First MD Initiated Contact with Patient 05/06/22 [phone]     (approximate)   History   Abdominal Pain and Chest Pain   HPI  Suzanne Snyder is a 72 y.o. female   Past medical history of type I diabetic on insulin pump, anxiety, chronic pain, who presents to the emergency department with epigastric pain for the last 2 days.  Very poor p.o. intake.  Has been vomiting.  Denies diarrhea or GI bleeding.  Poor p.o. intake.  She has some chest pain as well radiating up from her epigastrium.  Pain also radiates to her upper back.  She has no GU complaints   External Medical Documents Reviewed: Discharge summary from February 2024 when she was admitted to the hospital with right hip pain status post right hip ORIF, fever without a source      Physical Exam   Triage Vital Signs: ED Triage Vitals  Enc Vitals Group     BP 05/06/22 0303 (!) 110/59     Pulse Rate 05/06/22 0303 77     Resp 05/06/22 0303 15     Temp --      Temp src --      SpO2 05/06/22 0254 95 %     Weight 05/06/22 0302 108 lb (49 kg)     Height --      Head Circumference --      Peak Flow --      Pain Score 05/06/22 0302 10     Pain Loc --      Pain Edu? --      Excl. in GC? --     Most recent vital signs: Vitals:   05/06/22 0430 05/06/22 0500  BP: (!) 117/42 (!) 116/46  Pulse: 61 63  Resp: 13 14  SpO2: 100% 100%    General: Awake, no distress.  CV:  Good peripheral perfusion.  Resp:  Normal effort.  Abd:  No distention.  Other:  Chronically ill-appearing, frail, cachectic, appears dehydrated with dry mucous membranes and poor skin turgor, epigastric tenderness to palpation without rigidity or guarding.   ED Results / Procedures / Treatments   Labs (all labs ordered are listed, but only abnormal results are displayed) Labs Reviewed  CBC WITH DIFFERENTIAL/PLATELET - Abnormal; Notable for the following components:      Result  Value   WBC 12.8 (*)    RDW 16.3 (*)    Neutro Abs 9.9 (*)    All other components within normal limits  COMPREHENSIVE METABOLIC PANEL - Abnormal; Notable for the following components:   Sodium 133 (*)    Chloride 95 (*)    Glucose, Bld 153 (*)    BUN 32 (*)    AST 14 (*)    All other components within normal limits  BETA-HYDROXYBUTYRIC ACID - Abnormal; Notable for the following components:   Beta-Hydroxybutyric Acid 0.38 (*)    All other components within normal limits  LIPASE, BLOOD  URINALYSIS, ROUTINE W REFLEX MICROSCOPIC  CBG MONITORING, ED  TROPONIN I (HIGH SENSITIVITY)  TROPONIN I (HIGH SENSITIVITY)     I ordered and reviewed the above labs they are notable for glucose is 153 and she has a normal anion gap, she has a mild leukocytosis at 12.8  EKG  ED ECG REPORT I, Pilar Jarvis, the attending physician, personally viewed and interpreted this ECG.   Date: 05/06/2022  EKG Time: 0309  Rate:  85  Rhythm: nsr  Axis: nl  Intervals:none  ST&T Change: no stemi    RADIOLOGY I independently reviewed and interpreted CT scan of the abdomen pelvis and see a markedly distended stomach as well as small bowel loops concerning for obstruction   PROCEDURES:  Critical Care performed: No  Ultrasound ED Peripheral IV (Provider)  Date/Time: 05/06/2022 5:25 AM  Performed by: Pilar Jarvis, MD Authorized by: Pilar Jarvis, MD   Procedure details:    Indications: multiple failed IV attempts     Skin Prep: isopropyl alcohol     Location: right upper arm.   Angiocath:  20 G   Bedside Ultrasound Guided: Yes     Images: not archived     Patient tolerated procedure without complications: Yes     Dressing applied: Yes      MEDICATIONS ORDERED IN ED: Medications  fentaNYL (SUBLIMAZE) injection 50 mcg (has no administration in time range)  sodium chloride 0.9 % bolus 1,000 mL (1,000 mLs Intravenous New Bag/Given 05/06/22 0520)  ondansetron (ZOFRAN) injection 4 mg (4 mg Intravenous  Given 05/06/22 0521)  pantoprazole (PROTONIX) injection 40 mg (40 mg Intravenous Given 05/06/22 0520)  alum & mag hydroxide-simeth (MAALOX/MYLANTA) 200-200-20 MG/5ML suspension 30 mL (30 mLs Oral Given 05/06/22 0429)    And  lidocaine (XYLOCAINE) 2 % viscous mouth solution 15 mL (15 mLs Oral Given 05/06/22 0428)  iohexol (OMNIPAQUE) 300 MG/ML solution 100 mL (80 mLs Intravenous Contrast Given 05/06/22 0536)    IMPRESSION / MDM / ASSESSMENT AND PLAN / ED COURSE  I reviewed the triage vital signs and the nursing notes.                                Patient's presentation is most consistent with acute presentation with potential threat to life or bodily function.  Differential diagnosis includes, but is not limited to, DKA, dehydration, metabolic derangement, obstruction, intra-abdominal infection, gastritis, perforated ulcer, cholecystitis, pancreatitis   The patient is on the cardiac monitor to evaluate for evidence of arrhythmia and/or significant heart rate changes.  MDM: Type I diabetic who appears ill, with nausea, epigastric pain concerning for DKA versus intra-abdominal infection.  Appears dehydrated.  Basic labs, urinalysis, and CT scan of the chest abdomen and pelvis.  Give IV crystalloids for dehydration   -- She is not in DKA.  Labs largely unremarkable, pending UA and CT scan.  Patient resting comfortably ongoing abdominal pain but appears more comfortable. --        FINAL CLINICAL IMPRESSION(S) / ED DIAGNOSES   Final diagnoses:  Nausea and vomiting, unspecified vomiting type  Upper abdominal pain  Dehydration     Rx / DC Orders   ED Discharge Orders          Ordered    pantoprazole (PROTONIX) 40 MG tablet  Daily        05/06/22 0548    Ambulatory referral to Gastroenterology        05/06/22 0548             Note:  This document was prepared using Dragon voice recognition software and may include unintentional dictation errors.    Pilar Jarvis,  MD 05/06/22 2440    Pilar Jarvis, MD 05/06/22 1027    Pilar Jarvis, MD 05/06/22 [phone]

## 2022-05-06 NOTE — Consult Note (Signed)
ANTICOAGULATION CONSULT NOTE - Initial Consult  Pharmacy Consult for enoxaparin Indication: Portal vein occlusion   Allergies  Allergen Reactions   Cat Hair Extract Shortness Of Breath   Pregabalin Anaphylaxis   Erythromycin Other (See Comments)    Severe stomach upset    Patient Measurements: Height:  (172.7 cm) Weight: 49 kg (108 lb) IBW/kg (Calculated) : 63.9   Vital Signs: BP: 128/49 (04/24 0800) Pulse Rate: 69 (04/24 0800)  Labs: Recent Labs    05/06/22 0450  HGB 13.9  HCT 42.4  PLT 360  CREATININE 0.84  TROPONINIHS 4    Estimated Creatinine Clearance: 48.2 mL/min (by C-G formula based on SCr of 0.84 mg/dL).   Medical History: Past Medical History:  Diagnosis Date   Anxiety    Arthritis    Asthma    Diabetes mellitus without complication    Insulin pump in place    Osteoporosis    RLS (restless legs syndrome)    Smokers' cough    Wears dentures    full upper and lower    Medications:  No home anticoagulation per pharmacist review.  Was on prophylaxis enoxaparin in February of 2023 post ORIF of right hip.  Assessment: 72 yo female presented to ED with complaint of abdominal pain.  Patient being admitted for treatment of small bowel obstruction.  Imaging also revealed portal vein obstruction.  Pharmacy consulted to start therapeutic enoxaparin.  Goal of Therapy:  Monitor platelets by anticoagulation protocol: Yes   Plan:  Start enoxaparin 50 mg (1 mg/kg) SubQ every 12 hours. Consider checking anti-Xa level if patient on prolonged therapy. CBC at least every 72 hours.  Barrie Folk, PharmD 05/06/2022,9:11 AM

## 2022-05-06 NOTE — ED Triage Notes (Signed)
Pt presents to ER via ems with c/o abd pain, and n/v for last 2 days.  Pt states she is also having some severe lower back pain, heartburn, chest pain, and sweating that have been off and on for last 3 days.  Pt denies any fevers at home.  Pt is otherwise A&O x4 and in NAD in triage.  Pt noted to be dozing off during triage and irritable when asked questions at times.

## 2022-05-06 NOTE — Assessment & Plan Note (Addendum)
Noted portal vein occlusion on imaging Suspect may be confounder to abdominal pain in the setting of small bowel obstruction Case preliminary discussed with Sheppard Plumber, NP with vascular surgery Chronicity relatively unclear Will place on Lovenox for anticoagulation No reported alcohol use Will check hypercoagulable panel Right upper quadrant ultrasound Consider further imaging including mesenteric duplex versus CT angio of the abdomen pelvis Formal vascular surgery as clinically indicated

## 2022-05-06 NOTE — ED Notes (Signed)
Entered room to introduce self to pt. Pt is sleeping on side. Pt in no acute distress at this moment.

## 2022-05-06 NOTE — Assessment & Plan Note (Addendum)
Worsening abd pain x 2 days w/ noted small bowel obstruction on imaging  Also w/ ? Short segment portal vein occlusion on imaging.  Pain control  Antiemetics  Will discuss w/ general surgery for further recommendations.

## 2022-05-06 NOTE — Assessment & Plan Note (Signed)
Worsening abd pain x 2 days w/ noted small bowel obstruction on imaging  Also w/ ? Short segment portal vein occlusion on imaging.  Pain control  Antiemetics  Will discuss w/ general surgery for further recommendations.    

## 2022-05-06 NOTE — Discharge Instructions (Addendum)
Drink plenty of fluids to stay well-hydrated.  Take Tylenol 650 mg every 6 hours for pain.  Take pantoprazole as prescribed, every day, for upper abdominal pain which may be due to extra stomach acid.  I made a referral to gastroenterology who will give you a call for a follow-up appointment.

## 2022-05-06 NOTE — Consult Note (Signed)
[facility] SURGICAL CONSULTATION NOTE (initial) - cpt: 40981   HISTORY OF PRESENT ILLNESS (HPI):  72 y.o. female presented to [facility] ED overnight for evaluation of abdominal pain. Patient reports the acute onset of generalized abdominal pain about 4 days ago which has been progressively worsening. She states this is worse in "her food stomach." This has been accompanied by nausea and emesis. She reports last bowel movement was "earlier this morning." No fever, chills, cough, CP or SOB. She denied any previous intra-abdominal surgeries. Did have right hip ORIF in January with Dr Hyacinth Meeker. Work up in the ED revealed a mild leukocytosis to 12.8K, Hgb to 13.9, renal function normal with sCr - 0.84, mild hyponatremia to 133. CT Abdomen/Pelvis was obtained and concerning for for significant stomach and small bowel distension. She was admitted to the medicine service.   Surgery is consulted by emergency medicine physician Dr. Pilar Jarvis, MD in this context for evaluation and management of small bowel obstruction.   PAST MEDICAL HISTORY (PMH):  Past Medical History:  Diagnosis Date   Anxiety    Arthritis    Asthma    Diabetes mellitus without complication (HCC)    Insulin pump in place    Osteoporosis    RLS (restless legs syndrome)    Smokers' cough (HCC)    Wears dentures    full upper and lower     PAST SURGICAL HISTORY (PSH):  Past Surgical History:  Procedure Laterality Date   CATARACT EXTRACTION W/PHACO Right 02/20/2020   Procedure: CATARACT EXTRACTION PHACO AND INTRAOCULAR LENS PLACEMENT (IOC) RIGHT 5.60 00:36.3;  Surgeon: Galen Manila, MD;  Location: [facility];  Service: Ophthalmology;  Laterality: Right;  Diabetic - insulin pump Requests not first   CATARACT EXTRACTION W/PHACO Left 03/05/2020   Procedure: CATARACT EXTRACTION PHACO AND INTRAOCULAR LENS PLACEMENT (IOC) LEFT 4.94 00:31.6;  Surgeon: Galen Manila, MD;  Location: [facility];   Service: Ophthalmology;  Laterality: Left;   HIP PINNING,CANNULATED Right 02/09/2022   Procedure: PERCUTANEOUS FIXATION OF FEMORAL NECK;  Surgeon: Deeann Saint, MD;  Location: [facility];  Service: Orthopedics;  Laterality: Right;   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   ROTATOR CUFF REPAIR Left 2014 and 2015   SPINE SURGERY N/A 1984   L4-5, not sure of procedure   THYROID SURGERY       MEDICATIONS:  Prior to Admission medications   Medication Sig Start Date End Date Taking? Authorizing Provider  pantoprazole (PROTONIX) 40 MG tablet Take 1 tablet (40 mg total) by mouth daily. 05/06/22 08/04/22 Yes Pilar Jarvis, MD  acetaminophen (TYLENOL) 325 MG tablet Take 2 tablets (650 mg total) by mouth every 6 (six) hours as needed for mild pain, moderate pain, fever or headache (or Fever >/= 101). 02/17/22   Gillis Santa, MD  albuterol (VENTOLIN HFA) 108 (90 Base) MCG/ACT inhaler Inhale into the lungs every 6 (six) hours as needed for wheezing or shortness of breath.    [provider]  amLODipine (NORVASC) 5 MG tablet Take 1 tablet (5 mg total) by mouth every evening. 02/17/22 05/18/22  Gillis Santa, MD  bisacodyl (DULCOLAX) 10 MG suppository Place 1 suppository (10 mg total) rectally daily as needed for severe constipation. 02/17/22   Gillis Santa, MD  bisacodyl (DULCOLAX) 5 MG EC tablet Take 2 tablets (10 mg total) by mouth daily as needed for moderate constipation. 02/17/22   Gillis Santa, MD  clonazePAM (KLONOPIN) 0.5 MG tablet Take 1 tablet (0.5 mg total) by  mouth 2 (two) times daily as needed for anxiety. 02/17/22   Gillis Santa, MD  cyanocobalamin 1000 MCG tablet Take 1 tablet (1,000 mcg total) by mouth daily. 02/21/22 05/22/22  Gillis Santa, MD  cyclobenzaprine (FLEXERIL) 10 MG tablet Take 10 mg by mouth 3 (three) times daily as needed for muscle spasms.    [provider]  diphenhydrAMINE HCl (BENADRYL ALLERGY PO) Take 50 mg by mouth as needed.    [provider]  furosemide  (LASIX) 20 MG tablet Take 20 mg by mouth daily as needed for edema or fluid. 12/31/21   [provider]  gabapentin (NEURONTIN) 300 MG capsule Take 300 mg by mouth 4 (four) times daily.    [provider]  hydrALAZINE (APRESOLINE) 50 MG tablet Take 1 tablet (50 mg total) by mouth 3 (three) times daily as needed (SBP >160). 02/17/22   Gillis Santa, MD  hydrOXYzine (ATARAX/VISTARIL) 10 MG tablet TAKE 1 OR 2 TABLET BY MOUTH THREE TIMES DAILY AS NEEDED FOR [facility] 03/19/20   Willeen Niece, MD  insulin aspart (NOVOLOG) 100 UNIT/ML injection Inject into the skin 4 (four) times daily as needed for high blood sugar (with meals and at bedtime). Sliding scale    [provider]  ipratropium (ATROVENT) 0.02 % nebulizer solution Take 0.5 mg by nebulization every 6 (six) hours as needed for wheezing or shortness of breath.    [provider]  iron polysaccharides (NIFEREX) 150 MG capsule Take 1 capsule (150 mg total) by mouth daily. 02/17/22 05/18/22  Gillis Santa, MD  Multiple Vitamin (MULTIVITAMIN ADULT PO) Take by mouth 2 (two) times daily.    [provider]  Omega-3 Fatty Acids (FISH OIL) 1000 MG CAPS Take 1.5 capsules by mouth 2 (two) times daily. 08/04/19   [provider]  polyethylene glycol (MIRALAX) 17 g packet Take 17 g by mouth daily. 02/11/22   Wouk, Wilfred Curtis, MD  polyvinyl alcohol (LIQUIFILM TEARS) 1.4 % ophthalmic solution Place 1 drop into both eyes as needed for dry eyes. 02/17/22   Gillis Santa, MD  rOPINIRole (REQUIP) 1 MG tablet Take 1.5 mg by mouth at bedtime.    [provider]  Saccharomyces boulardii (PROBIOTIC) 250 MG CAPS Take 1 capsule by mouth 3 times/day as needed-between meals & bedtime. 01/07/22   Willy Eddy, MD  traMADol (ULTRAM) 50 MG tablet Take 1-2 tablets (50-100 mg total) by mouth 2 (two) times daily as needed. 03/10/22 06/08/22  Edward Jolly, MD  Vitamin D, Ergocalciferol, (DRISDOL) 50000 UNITS CAPS capsule Take  50,000 Units by mouth 2 (two) times a week.    [provider]     ALLERGIES:  Allergies  Allergen Reactions   Cat Hair Extract Shortness Of Breath   Pregabalin Anaphylaxis   Erythromycin Other (See Comments)    Severe stomach upset     SOCIAL HISTORY:  Social History   Socioeconomic History   Marital status: Married    Spouse name: Not on file   Number of children: Not on file   Years of education: Not on file   Highest education level: Not on file  Occupational History   Not on file  Tobacco Use   Smoking status: Every Day    Packs/day: 1.00    Years: 50.00    Additional pack years: 0.00    Total pack years: 50.00    Types: Cigarettes   Smokeless tobacco: Never   Tobacco comments:    started smoking age 66  Vaping Use  Vaping Use: Former   Devices: "tried itChiropractor and Sexual Activity   Alcohol use: No   Drug use: No   Sexual activity: Not on file  Other Topics Concern   Not on file  Social History Narrative   Not on file   Social Determinants of Health   Financial Resource Strain: Not on file  Food Insecurity: No Food Insecurity (02/14/2022)   Hunger Vital Sign    Worried About Running Out of Food in the Last Year: Never true    Ran Out of Food in the Last Year: Never true  Transportation Needs: No Transportation Needs (02/14/2022)   PRAPARE - Administrator, Civil Service (Medical): No    Lack of Transportation (Non-Medical): No  Physical Activity: Not on file  Stress: Not on file  Social Connections: Not on file  Intimate Partner Violence: Not At Risk (02/14/2022)   Humiliation, Afraid, Rape, and Kick questionnaire    Fear of Current or Ex-Partner: No    Emotionally Abused: No    Physically Abused: No    Sexually Abused: No     FAMILY HISTORY:  Family History  Problem Relation Age of Onset   Parkinsonism Father       REVIEW OF SYSTEMS:  Review of Systems  Constitutional:  Negative for chills and fever.  Respiratory:   Negative for cough and shortness of breath.   Cardiovascular:  Negative for chest pain and palpitations.  Gastrointestinal:  Positive for abdominal pain, nausea and vomiting. Negative for constipation and diarrhea.  Genitourinary:  Negative for dysuria and urgency.  All other systems reviewed and are negative.   VITAL SIGNS:  Pulse Rate:  [61-77] 63 (04/24 0500) Resp:  [12-15] 14 (04/24 0500) BP: (110-136)/(42-66) 116/46 (04/24 0500) SpO2:  [92 %-100 %] 100 % (04/24 0500) Weight:  [49 kg] 49 kg (04/24 0302)       Weight: 49 kg     INTAKE/OUTPUT:  No intake/output data recorded.  PHYSICAL EXAM:  Physical Exam Vitals and nursing note reviewed. Exam conducted with a chaperone present.  Constitutional:      Appearance: She is not ill-appearing.     Comments: Patient resting prone on stretcher, thin appearing, NAD  HENT:     Head: Normocephalic and atraumatic.  Eyes:     General: No scleral icterus.    Extraocular Movements: Extraocular movements intact.  Cardiovascular:     Rate and Rhythm: Normal rate.     Heart sounds: Normal heart sounds. No murmur heard. Pulmonary:     Effort: Pulmonary effort is normal. No respiratory distress.  Abdominal:     General: Abdomen is flat. There is no distension.     Palpations: Abdomen is soft.     Tenderness: There is abdominal tenderness in the epigastric area and left upper quadrant. There is no guarding or rebound.     Comments: Abdomen is soft, she reports majority of her pain in in epigastrium, does not appear overtly distended but is tympanic, no rebound/guarding. She is certainly without peritonitis   Genitourinary:    Comments: Deferred Skin:    General: Skin is warm and dry.     Coloration: Skin is not jaundiced.  Neurological:     General: No focal deficit present.     Mental Status: She is alert and oriented to person, place, and time.  Psychiatric:        Mood and Affect: Mood normal.        Behavior:  Behavior normal.       Labs:     Latest Ref Rng & Units 05/06/2022    4:50 AM 02/17/2022    9:59 AM 02/16/2022    4:30 AM  CBC  WBC 4.0 - 10.5 K/uL 12.8  6.4  5.6   Hemoglobin 12.0 - 15.0 g/dL 40.9  9.8  9.3   Hematocrit 36.0 - 46.0 % 42.4  30.6  28.7   Platelets 150 - 400 K/uL 360  359  287       Latest Ref Rng & Units 05/06/2022    4:50 AM 02/17/2022    9:59 AM 02/16/2022    4:30 AM  CMP  Glucose 70 - 99 mg/dL 811  914  782   BUN 8 - 23 mg/dL 32  14  14   Creatinine 0.44 - 1.00 mg/dL 9.56  2.13  0.86   Sodium 135 - 145 mmol/L 133  133  136   Potassium 3.5 - 5.1 mmol/L 4.3  3.8  3.8   Chloride 98 - 111 mmol/L 95  101  102   CO2 22 - 32 mmol/L Calcium 8.9 - 10.3 mg/dL 9.6  8.4  8.1   Total Protein 6.5 - 8.1 g/dL 7.1     Total Bilirubin 0.3 - 1.2 mg/dL 0.7     Alkaline Phos 38 - 126 U/L 90     AST 15 - 41 U/L 14     ALT 0 - 44 U/L 16        Imaging studies:   CT Abdomen/Pelvis (05/06/2022) personally reviewed which is concerning for significant dilation of stomach and small bowel, and radiologist report reviewed below:  IMPRESSION: 1. Small bowel obstruction with transition zone in the posterior right pelvis extending towards a small, wide-mouth right-sided sciatic hernia of the pelvis. As such, mechanical obstruction is favored either from adhesion or the shallow right sided sciatic hernia 2. 2.4 x 1.6 cm hyperattenuating focus in the posterior right liver is new in the interval. This is indeterminate and may represent a focal area of fatty sparing or lesion with diffuse enhancement. General background heterogeneous enhancement of liver parenchyma. Consider abdominal MRI with and without contrast after resolution of acute symptoms to further evaluate. 3. New bilateral pulmonary nodules measuring up to 10 x 6 mm in the anterior right lower lobe. Non-contrast chest CT at 3-6 months is recommended. If the nodules are stable at time of repeat CT, then future CT at 18-24 months  (from today's scan) is considered optional for low-risk patients, but is recommended for high-risk patients. This recommendation follows the consensus statement: Guidelines for Management of Incidental Pulmonary Nodules Detected on CT Images: From the Fleischner Society 2017; Radiology 2017; 284:228-243. 4. Apparent short segment occlusion of the portal vein near the portosplenic confluence with some cavernous transformation in the porta hepatis. 5. Pelvic floor laxity. 6.  Aortic Atherosclerosis (ICD10-I70.0).   Assessment/Plan: (ICD-10's: K14.609) 72 y.o. female with abdominal pain, nausea, emesis found to have marked small bowel distension concerning for possible SBO, although there is lack of previous intra-abdominal surgery   - Appreciate medicine admission - I do think she warrants NGT decompression; LIS; monitor and record output - No emergent surgical intervention; will follow progress - Monitor abdominal examination; on-going bowel function - Serial KUBs - Pain control prn; antiemetics prn   - Mobilize as tolerated   - Further management per primary service; we will follow    -  DVT prophylaxis  All of the above findings and recommendations were discussed with the patient, and all of patient's questions were answered to her expressed satisfaction.  Thank you for the opportunity to participate in this patient's care.   -- Lynden Oxford, PA-C  [facility] 05/06/2022, 7:19 AM M-F: 7am - 4pm

## 2022-05-06 NOTE — ED Notes (Signed)
PA from GI/surgery at bedside. Pt complains of upper abdominal pain since 4 days with N/V. LBM yesterday.

## 2022-05-06 NOTE — Progress Notes (Signed)
Mobility Specialist - Progress Note   05/06/22 1100  Mobility  Activity Transferred to/from [facility]  Level of Assistance Moderate assist, patient does 50-74%  Assistive Device Other (Comment)  Distance Ambulated (ft) 2 ft  Activity Response Tolerated fair  $Mobility charge 1 Mobility     Pt lying in bed upon arrival, utilizing RA. Requesting assistance to restroom. Pt reports feeling very nauseous. Completed bed mobility modI. Good sitting balance. STS via underarm method with modA; very shaky and weak in LE so ambulation deferred. Pt SPT to [facility] for urinary output. Supervision for seated peri-care. Pt returned to bed with alarm set, needs in reach. RN notified.    Filiberto Pinks Mobility Specialist 05/06/22, 12:31 PM

## 2022-05-06 NOTE — Assessment & Plan Note (Signed)
1 PPD smoker  Discussed cessation  Nicotine patch

## 2022-05-06 NOTE — ED Triage Notes (Signed)
EMS brings pt in from home for c/o abd pain x 2 days

## 2022-05-06 NOTE — H&P (Addendum)
History and Physical    Patient: Suzanne Snyder:[id number] DOB: October 12, 1950 DOA: 05/06/2022 DOS: the patient was seen and examined on 05/06/2022 PCP: Gracelyn Nurse, MD  Patient coming from: Home  Chief Complaint:  Chief Complaint  Patient presents with   Abdominal Pain   Chest Pain   HPI: OZIE DIMARIA is a 72 y.o. female with medical history significant of type 1 diabetes, asthma, tobacco abuse, neuropathy, chronic pain presenting with small bowel obstruction.  Patient ports approximately 2 days of generalized abdominal pain, decreased p.o. intake, nausea and vomiting.  No fevers or chills.  No chest pain.  No reported diarrhea.  Emesis nonbloody nonbilious.  Patient reports that she may have had prior symptoms like this in the past.  Denies any prior abdominal surgeries- though is s/p cholecystectomy on imaging.  Symptoms progressively worsening.  No reported change in diet.  Has not been checking sugars at home.  No reported alcohol use.  Baseline 1+ pack per day smoker.  No reported alcohol use. Presented to the ER afebrile, hemodynamically stable.  White count 12.8, hemoglobin 14, platelets 360, creatinine 0.84, glucose 153.  Chest x-ray grossly stable.  CT chest abdomen pelvis with concern for small right mechanical bowel obstruction.  Also with 2.4 x 1 6 cm hypoattenuating focus in the right posterior liver with recommended for MRI imaging after resolution of symptoms.  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 Review of Systems: As mentioned in the history of present illness. All other systems reviewed and are negative. Past Medical History:  Diagnosis Date   Anxiety    Arthritis    Asthma    Diabetes mellitus without complication (HCC)    Insulin pump in place    Osteoporosis    RLS (restless legs syndrome)    Smokers' cough (HCC)    Wears dentures    full upper  and lower   Past Surgical History:  Procedure Laterality Date   CATARACT EXTRACTION W/PHACO Right 02/20/2020   Procedure: CATARACT EXTRACTION PHACO AND INTRAOCULAR LENS PLACEMENT (IOC) RIGHT 5.60 00:36.3;  Surgeon: Galen Manila, MD;  Location: [facility];  Service: Ophthalmology;  Laterality: Right;  Diabetic - insulin pump Requests not first   CATARACT EXTRACTION W/PHACO Left 03/05/2020   Procedure: CATARACT EXTRACTION PHACO AND INTRAOCULAR LENS PLACEMENT (IOC) LEFT 4.94 00:31.6;  Surgeon: Galen Manila, MD;  Location: [facility];  Service: Ophthalmology;  Laterality: Left;   HIP PINNING,CANNULATED Right 02/09/2022   Procedure: PERCUTANEOUS FIXATION OF FEMORAL NECK;  Surgeon: Deeann Saint, MD;  Location: [facility];  Service: Orthopedics;  Laterality: Right;   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   ROTATOR CUFF REPAIR Left 2014 and 2015   SPINE SURGERY N/A 1984   L4-5, not sure of procedure   THYROID SURGERY     Social History:  reports that she has been smoking cigarettes. She has a 50.00 pack-year smoking history. She has never used smokeless tobacco. She reports that she does not drink alcohol and does not use drugs.  Allergies  Allergen Reactions   Cat Hair Extract Shortness Of Breath   Pregabalin Anaphylaxis   Erythromycin Other (See Comments)    Severe stomach upset    Family History  Problem Relation Age of Onset   Parkinsonism Father     Prior to Admission medications   Medication Sig Start Date End Date Taking? Authorizing  Provider  hydrOXYzine (ATARAX) 25 MG tablet Take 25 mg by mouth 4 (four) times daily. 03/17/22  Yes [provider]  NOVOLOG 100 UNIT/ML injection pump 12/01/21  Yes [provider]  pantoprazole (PROTONIX) 40 MG tablet Take 1 tablet (40 mg total) by mouth daily. 05/06/22 08/04/22 Yes Pilar Jarvis, MD  acetaminophen (TYLENOL) 325 MG tablet Take 2 tablets (650 mg total) by mouth every 6 (six) hours as needed for  mild pain, moderate pain, fever or headache (or Fever >/= 101). 02/17/22   Gillis Santa, MD  albuterol (VENTOLIN HFA) 108 (90 Base) MCG/ACT inhaler Inhale into the lungs every 6 (six) hours as needed for wheezing or shortness of breath.    [provider]  amLODipine (NORVASC) 5 MG tablet Take 1 tablet (5 mg total) by mouth every evening. 02/17/22 05/18/22  Gillis Santa, MD  bisacodyl (DULCOLAX) 10 MG suppository Place 1 suppository (10 mg total) rectally daily as needed for severe constipation. 02/17/22   Gillis Santa, MD  bisacodyl (DULCOLAX) 5 MG EC tablet Take 2 tablets (10 mg total) by mouth daily as needed for moderate constipation. 02/17/22   Gillis Santa, MD  clonazePAM (KLONOPIN) 0.5 MG tablet Take 1 tablet (0.5 mg total) by mouth 2 (two) times daily as needed for anxiety. 02/17/22   Gillis Santa, MD  cyanocobalamin 1000 MCG tablet Take 1 tablet (1,000 mcg total) by mouth daily. 02/21/22 05/22/22  Gillis Santa, MD  cyclobenzaprine (FLEXERIL) 10 MG tablet Take 10 mg by mouth 3 (three) times daily as needed for muscle spasms.    [provider]  diphenhydrAMINE HCl (BENADRYL ALLERGY PO) Take 50 mg by mouth as needed.    [provider]  furosemide (LASIX) 20 MG tablet Take 20 mg by mouth daily as needed for edema or fluid. 12/31/21   [provider]  gabapentin (NEURONTIN) 300 MG capsule Take 300 mg by mouth 4 (four) times daily.    [provider]  hydrALAZINE (APRESOLINE) 50 MG tablet Take 1 tablet (50 mg total) by mouth 3 (three) times daily as needed (SBP >160). 02/17/22   Gillis Santa, MD  hydrOXYzine (ATARAX/VISTARIL) 10 MG tablet TAKE 1 OR 2 TABLET BY MOUTH THREE TIMES DAILY AS NEEDED FOR [facility] 03/19/20   Willeen Niece, MD  insulin aspart (NOVOLOG) 100 UNIT/ML injection Inject into the skin 4 (four) times daily as needed for high blood sugar (with meals and at bedtime). Sliding scale    [provider]  ipratropium (ATROVENT) 0.02 % nebulizer  solution Take 0.5 mg by nebulization every 6 (six) hours as needed for wheezing or shortness of breath.    [provider]  iron polysaccharides (NIFEREX) 150 MG capsule Take 1 capsule (150 mg total) by mouth daily. 02/17/22 05/18/22  Gillis Santa, MD  Multiple Vitamin (MULTIVITAMIN ADULT PO) Take by mouth 2 (two) times daily.    [provider]  Omega-3 Fatty Acids (FISH OIL) 1000 MG CAPS Take 1.5 capsules by mouth 2 (two) times daily. 08/04/19   [provider]  polyethylene glycol (MIRALAX) 17 g packet Take 17 g by mouth daily. 02/11/22   Wouk, Wilfred Curtis, MD  polyvinyl alcohol (LIQUIFILM TEARS) 1.4 % ophthalmic solution Place 1 drop into both eyes as needed for dry eyes. 02/17/22   Gillis Santa, MD  rOPINIRole (REQUIP) 1 MG tablet Take 1.5 mg by mouth at bedtime.    [provider]  Saccharomyces boulardii (PROBIOTIC) 250 MG CAPS Take 1 capsule by mouth 3 times/day  as needed-between meals & bedtime. 01/07/22   Willy Eddy, MD  traMADol (ULTRAM) 50 MG tablet Take 1-2 tablets (50-100 mg total) by mouth 2 (two) times daily as needed. 03/10/22 06/08/22  Edward Jolly, MD  Vitamin D, Ergocalciferol, (DRISDOL) 50000 UNITS CAPS capsule Take 50,000 Units by mouth 2 (two) times a week.    [provider]    Physical Exam: Vitals:   05/06/22 0303 05/06/22 0400 05/06/22 0430 05/06/22 0500  BP: (!) 110/59 136/66 (!) 117/42 (!) 116/46  Pulse: 77 76 61 63  Resp: 15 12 13 14   SpO2: 100% 92% 100% 100%  Weight:       Physical Exam Constitutional:      Appearance: She is normal weight.  HENT:     Head: Normocephalic and atraumatic.     Mouth/Throat:     Mouth: Mucous membranes are dry.  Eyes:     Pupils: Pupils are equal, round, and reactive to light.  Cardiovascular:     Rate and Rhythm: Normal rate and regular rhythm.  Pulmonary:     Effort: Pulmonary effort is normal.  Abdominal:     General: Bowel sounds are normal.     Comments: + mild  generalized abd TTP    Musculoskeletal:        General: Normal range of motion.     Cervical back: Normal range of motion.  Skin:    General: Skin is dry.  Neurological:     General: No focal deficit present.  Psychiatric:        Mood and Affect: Mood normal.     Data Reviewed:  There are no new results to review at this time. CT CHEST ABDOMEN PELVIS W CONTRAST CLINICAL DATA:  Chest pain and shortness of breath. Abdominal pain with epigastric tenderness. Nausea and vomiting.  EXAM: CT CHEST, ABDOMEN, AND PELVIS WITH CONTRAST  TECHNIQUE: Multidetector CT imaging of the chest, abdomen and pelvis was performed following the standard protocol during bolus administration of intravenous contrast.  RADIATION DOSE REDUCTION: This exam was performed according to the departmental dose-optimization program which includes automated exposure control, adjustment of the mA and/or kV according to patient size and/or use of iterative reconstruction technique.  CONTRAST:  80mL OMNIPAQUE IOHEXOL 300 MG/ML  SOLN  COMPARISON:  Abdomen and pelvis CT 02/13/2022.  Chest CT 07/09/2021  FINDINGS: CT CHEST FINDINGS  Cardiovascular: The heart size is normal. No substantial pericardial effusion. Mild atherosclerotic calcification is noted in the wall of the thoracic aorta.  Mediastinum/Nodes: No mediastinal lymphadenopathy. There is no hilar lymphadenopathy. The esophagus has normal imaging features. There is no axillary lymphadenopathy.  Lungs/Pleura: 8 mm ground-glass nodule in the right upper lobe is stable since prior. 3 mm nodule in the right lower lobe (86/3) is new since previous study.  10 x 6 mm irregular spiculated nodule in the anterior right lower lobe (77/3) is also new in the interval. 3 mm adjacent nodules in the left upper lobe (98/3) evident.  No focal airspace consolidation. No pulmonary edema or pleural effusion.  Musculoskeletal: No worrisome lytic or sclerotic  osseous abnormality. Thoracolumbar scoliosis evident.  CT ABDOMEN PELVIS FINDINGS  Hepatobiliary: Heterogeneous attenuation of liver parenchyma may be related to geographic fatty deposition or differential perfusion. 2.4 x 1.6 cm hyperattenuating focus in the posterior right liver (image 60/series 2) is new in the interval. Similar appearance of intrahepatic biliary duct dilatation with trace pneumobilia evident in the left hepatic lobe. Gallbladder surgically absent. Common bile duct does not  appear to be substantially dilated.  Pancreas: Diffuse pancreatic parenchymal calcification is consistent with chronic pancreatitis. Pancreatic tail appears atrophic.  Spleen: No splenomegaly. No focal mass lesion.  Adrenals/Urinary Tract: Right adrenal gland unremarkable. Similar thickening of the left adrenal gland. Kidneys unremarkable. No evidence for hydroureter. The urinary bladder appears normal for the degree of distention.  Stomach/Bowel: Stomach is markedly dilated with food, fluid, and gas. Duodenum is dilated. Proximal small bowel loops are fluid-filled and dilated up to 3.8 cm diameter. Dilated small bowel loops extend down into the right pelvis where there is a transition zone (see axial image 100/2) extending towards a small, wide-mouth right-sided sciatic hernia of the pelvis (image 100/2). Decompressed small bowel is noted in the central and left pelvis. Colon is largely decompressed with small volume gas and stool seen along its course to the level of the rectum. Surgical changes are noted in the low rectum.  Vascular/Lymphatic: There is moderate atherosclerotic calcification of the abdominal aorta without aneurysm. 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 There is no gastrohepatic or hepatoduodenal ligament lymphadenopathy. No retroperitoneal or mesenteric lymphadenopathy.  Reproductive: No evidence for an adnexal mass.  Other:  No substantial intraperitoneal free fluid.  Musculoskeletal: As above, the patient is noted to have a right sciatic hernia. There is also a shallow left-sided sciatic hernia. Pelvic floor laxity evident. Fixation hardware noted in the hips bilaterally. Bones are diffusely demineralized. Right-sided sacral insufficiency fracture is similar to prior. Degenerative changes noted lower lumbar spine.  IMPRESSION: 1. Small bowel obstruction with transition zone in the posterior right pelvis extending towards a small, wide-mouth right-sided sciatic hernia of the pelvis. As such, mechanical obstruction is favored either from adhesion or the shallow right sided sciatic hernia 2. 2.4 x 1.6 cm hyperattenuating focus in the posterior right liver is new in the interval. This is indeterminate and may represent a focal area of fatty sparing or lesion with diffuse enhancement. General background heterogeneous enhancement of liver parenchyma. Consider abdominal MRI with and without contrast after resolution of acute symptoms to further evaluate. 3. New bilateral pulmonary nodules measuring up to 10 x 6 mm in the anterior right lower lobe. Non-contrast chest CT at 3-6 months is recommended. If the nodules are stable at time of repeat CT, then future CT at 18-24 months (from today's scan) is considered optional for low-risk patients, but is recommended for high-risk patients. This recommendation follows the consensus statement: Guidelines for Management of Incidental Pulmonary Nodules Detected on CT Images: From the Fleischner Society 2017; Radiology 2017; 284:228-243. 4. Apparent short segment occlusion of the portal vein near the portosplenic confluence with some cavernous transformation in the porta hepatis. 5. Pelvic floor laxity. 6.  Aortic Atherosclerosis (ICD10-I70.0).  Electronically Signed   By: Kennith Center M.D.   On: 05/06/2022 06:23 DG Chest 1 View CLINICAL DATA:  Chest pain with  nausea and vomiting.  EXAM: CHEST  1 VIEW  COMPARISON:  December 02, 2021  FINDINGS: The heart size and mediastinal contours are within normal limits. There is marked severity calcification of the aortic arch. Low lung volumes are noted. Mild, diffuse, chronic appearing increased interstitial lung markings are seen. Very mild atelectasis is present within the bilateral lung bases. There is no evidence of an acute infiltrate, pleural effusion or pneumothorax. A left shoulder prosthesis is seen. Stable mild to moderate severity dextroscoliosis of the lower thoracic spine is noted.  IMPRESSION: Low lung volumes without acute cardiopulmonary disease.  Electronically Signed  By: Aram Candela M.D.   On: 05/06/2022 03:28  Lab Results  Component Value Date   WBC 12.8 (H) 05/06/2022   HGB 13.9 05/06/2022   HCT 42.4 05/06/2022   MCV 89.3 05/06/2022   PLT 360 05/06/2022   Last metabolic panel Lab Results  Component Value Date   GLUCOSE 153 (H) 05/06/2022   NA 133 (L) 05/06/2022   K 4.3 05/06/2022   CL 95 (L) 05/06/2022   CO2 26 05/06/2022   BUN 32 (H) 05/06/2022   CREATININE 0.84 05/06/2022   GFRNONAA >60 05/06/2022   CALCIUM 9.6 05/06/2022   PHOS 2.8 02/17/2022   PROT 7.1 05/06/2022   ALBUMIN 3.8 05/06/2022   BILITOT 0.7 05/06/2022   ALKPHOS 90 05/06/2022   AST 14 (L) 05/06/2022   ALT 16 05/06/2022   ANIONGAP 12 05/06/2022    Assessment and Plan: * SBO (small bowel obstruction) Worsening abd pain x 2 days w/ noted small bowel obstruction on imaging  Also w/ ? Short segment portal vein occlusion on imaging.  Pain control  Antiemetics  Will discuss w/ general surgery for further recommendations.     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 Inpatient status is judged to be reasonable and necessary in order to provide the required intensity of service to ensure the patient's safety. The patient's presenting symptoms, physical exam findings, and initial radiographic and laboratory data in the context of their chronic comorbidities is felt to place them at high risk for further clinical deterioration. Furthermore, it is not anticipated that the patient will be medically stable for discharge from the hospital within 2 midnights of admission.   * I certify that at the point of admission it is my clinical judgment that the patient will require inpatient hospital care spanning beyond 2 midnights from the point of admission due to high intensity of service, high risk for further deterioration and high frequency of surveillance required.*  Author: Floydene Flock, MD 05/06/2022 7:57 AM  For on call review [email or web address].

## 2022-05-07 ENCOUNTER — Inpatient Hospital Stay: Payer: Medicare Other

## 2022-05-07 DIAGNOSIS — K56609 Unspecified intestinal obstruction, unspecified as to partial versus complete obstruction: Secondary | ICD-10-CM | POA: Diagnosis not present

## 2022-05-07 LAB — GLUCOSE, CAPILLARY
Glucose-Capillary: 109 mg/dL — ABNORMAL HIGH (ref 70–99)
Glucose-Capillary: 129 mg/dL — ABNORMAL HIGH (ref 70–99)
Glucose-Capillary: 143 mg/dL — ABNORMAL HIGH (ref 70–99)
Glucose-Capillary: 145 mg/dL — ABNORMAL HIGH (ref 70–99)
Glucose-Capillary: 200 mg/dL — ABNORMAL HIGH (ref 70–99)
Glucose-Capillary: 236 mg/dL — ABNORMAL HIGH (ref 70–99)
Glucose-Capillary: 38 mg/dL — CL (ref 70–99)
Glucose-Capillary: 43 mg/dL — CL (ref 70–99)
Glucose-Capillary: 66 mg/dL — ABNORMAL LOW (ref 70–99)
Glucose-Capillary: 87 mg/dL (ref 70–99)
Glucose-Capillary: 90 mg/dL (ref 70–99)
Glucose-Capillary: 94 mg/dL (ref 70–99)

## 2022-05-07 LAB — COMPREHENSIVE METABOLIC PANEL
ALT: 12 U/L (ref 0–44)
AST: 13 U/L — ABNORMAL LOW (ref 15–41)
Albumin: 3 g/dL — ABNORMAL LOW (ref 3.5–5.0)
Alkaline Phosphatase: 70 U/L (ref 38–126)
Anion gap: 8 (ref 5–15)
BUN: 27 mg/dL — ABNORMAL HIGH (ref 8–23)
CO2: 24 mmol/L (ref 22–32)
Calcium: 7.7 mg/dL — ABNORMAL LOW (ref 8.9–10.3)
Chloride: 108 mmol/L (ref 98–111)
Creatinine, Ser: 0.72 mg/dL (ref 0.44–1.00)
GFR, Estimated: >60 mL/min (ref >60)
Glucose, Bld: 73 mg/dL (ref 70–99)
Potassium: 4.2 mmol/L (ref 3.5–5.1)
Sodium: 136 mmol/L (ref 135–145)
Total Bilirubin: 0.4 mg/dL (ref 0.3–1.2)
Total Protein: 5.8 g/dL — ABNORMAL LOW (ref 6.5–8.1)

## 2022-05-07 LAB — CBC
HCT: 36 % (ref 36.0–46.0)
Hemoglobin: 11.2 g/dL — ABNORMAL LOW (ref 12.0–15.0)
MCH: 28.8 pg (ref 26.0–34.0)
MCHC: 31.1 g/dL (ref 30.0–36.0)
MCV: 92.5 fL (ref 80.0–100.0)
Platelets: 273 10*3/uL (ref 150–400)
RBC: 3.89 MIL/uL (ref 3.87–5.11)
RDW: 16.7 % — ABNORMAL HIGH (ref 11.5–15.5)
WBC: 5.9 10*3/uL (ref 4.0–10.5)
nRBC: 0 % (ref 0.0–0.2)

## 2022-05-07 LAB — HEMOGLOBIN A1C
Hgb A1c MFr Bld: 7.8 % — ABNORMAL HIGH (ref 4.8–5.6)
Mean Plasma Glucose: 177 mg/dL

## 2022-05-07 LAB — HOMOCYSTEINE: Homocysteine: 19.4 umol/L — ABNORMAL HIGH (ref 0.0–17.2)

## 2022-05-07 LAB — LUPUS ANTICOAGULANT PANEL
DRVVT: 30.2 s (ref 0.0–47.0)
PTT Lupus Anticoagulant: 30.1 s (ref 0.0–43.5)

## 2022-05-07 LAB — PROTEIN S, TOTAL: Protein S Ag, Total: 62 % (ref 60–150)

## 2022-05-07 LAB — PROTEIN S ACTIVITY: Protein S Activity: 41 % — ABNORMAL LOW (ref 63–140)

## 2022-05-07 LAB — PROTEIN C ACTIVITY: Protein C Activity: 97 % (ref 73–180)

## 2022-05-07 MED ORDER — DEXTROSE 50 % IV SOLN
50.0000 mL | Freq: Once | INTRAVENOUS | Status: DC | PRN
  Administered 2022-05-07 (×2): 50 mL via INTRAVENOUS
  Filled 2022-05-07: qty 50

## 2022-05-07 MED ORDER — DIATRIZOATE MEGLUMINE & SODIUM 66-10 % PO SOLN
90.0000 mL | Freq: Once | ORAL | Status: AC
  Administered 2022-05-07: 90 mL via NASOGASTRIC

## 2022-05-07 MED ORDER — DEXTROSE-NACL 5-0.9 % IV SOLN: INTRAVENOUS | Status: AC

## 2022-05-07 MED ORDER — DEXTROSE 50 % IV SOLN
25.0000 g | INTRAVENOUS | Status: AC
  Administered 2022-05-07: 25 g via INTRAVENOUS
  Filled 2022-05-07: qty 50

## 2022-05-07 MED ORDER — DEXTROSE 50 % IV SOLN
1.0000 | Freq: Once | INTRAVENOUS | Status: AC
  Filled 2022-05-07: qty 50

## 2022-05-07 MED ORDER — PROCHLORPERAZINE EDISYLATE 10 MG/2ML IJ SOLN
10.0000 mg | Freq: Four times a day (QID) | INTRAMUSCULAR | Status: DC | PRN
  Administered 2022-05-07: 10 mg via INTRAVENOUS
  Filled 2022-05-07: qty 2

## 2022-05-07 MED ORDER — HEPARIN (PORCINE) 25000 UT/250ML-% IV SOLN
850.0000 [IU]/h | INTRAVENOUS | Status: AC
  Administered 2022-05-07: 850 [IU]/h via INTRAVENOUS
  Filled 2022-05-07: qty 250

## 2022-05-07 MED ORDER — DEXTROSE 50 % IV SOLN: 1.0000 | Freq: Once | INTRAVENOUS | Status: AC

## 2022-05-07 NOTE — Progress Notes (Signed)
Hypoglycemic Event  CBG: 66  Treatment: D50 50ml (25gm)  Symptoms: None  Follow-up CBG: Time:2044 CBG Result:236  Possible Reasons for Event: Other: pt npo  Comments/MD notified:nursing protocol followed    Langley Ingalls N Jirah Rider

## 2022-05-07 NOTE — Progress Notes (Signed)
Progress Note   Patient: Suzanne Snyder:[id number] DOB: 1950-11-15 DOA: 05/06/2022     1 DOS: the patient was seen and examined on 05/07/2022    Subjective:  Patient seen and examined at bedside this morning Appears acutely ill Had episode of hypoglycemia this morning however repeat sugars have been within normal limit Currently having NG tube in place Denied worsening abdominal pain chest pain or cough  Brief hospital course: From HPI "Suzanne Snyder is a 72 y.o. female with medical history significant of type 1 diabetes, asthma, tobacco abuse, neuropathy, chronic pain presenting with small bowel obstruction.  Patient ports approximately 2 days of generalized abdominal pain, decreased p.o. intake, nausea and vomiting.  No fevers or chills.  No chest pain.  No reported diarrhea.  Emesis nonbloody nonbilious.  Patient reports that she may have had prior symptoms like this in the past.  Denies any prior abdominal surgeries- though is s/p cholecystectomy on imaging.  Symptoms progressively worsening.  No reported change in diet.  Has not been checking sugars at home.  No reported alcohol use.  Baseline 1+ pack per day smoker.  No reported alcohol use. Presented to the ER afebrile, hemodynamically stable.  White count 12.8, hemoglobin 14, platelets 360, creatinine 0.84, glucose 153.  Chest x-ray grossly stable.  CT chest abdomen pelvis with concern for small right mechanical bowel obstruction.  Also with 2.4 x 1 6 cm hypoattenuating focus in the right posterior liver with recommended for MRI imaging after resolution of symptoms.  Imaging also with short segment occlusion of the portal vein near the porta splenic confluence with some cavernous transformation in the porta hepatis.."    Assessment and Plan: * SBO (small bowel obstruction) Worsening abd pain x 2 days w/ noted small bowel obstruction on imaging  Also w/ ? Short segment portal vein occlusion on imaging.  Pain control  Antiemetics   General surgery team on board we appreciate input Continue NG tube management     Portal vein obstruction Noted portal vein occlusion on imaging Suspect may be confounder to abdominal pain in the setting of small bowel obstruction Case preliminary discussed with Sheppard Plumber, NP with vascular surgery Chronicity relatively unclear Continue Lovenox for anticoagulation No reported alcohol use Follow-up hypercoagulable panel Showing dilated and torturous vessels at the porta hepatis likely suggestive of collateral formation Vascular surgeon been consulted for input   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  HENT:     Head: Normocephalic and atraumatic.  Eyes:     Pupils: Pupils are equal, round, and reactive to light.  Cardiovascular:     Rate and Rhythm: Normal rate and regular rhythm.  Pulmonary:     Effort: Pulmonary effort is normal.  Abdominal:     General: Bowel sounds are normal.     Comments: + mild generalized abdominal tenderness Musculoskeletal:        General: Normal range of motion.  Skin:    General: Skin is dry.  Neurological:     General: Appears  lethargic  Vitals:   05/06/22 1837 05/06/22 1947 05/07/22 0357 05/07/22 0821  BP: 139/60 (!) 130/52 122/65 (!) 108/45  Pulse: 92 80 75 72  Resp: Temp: 98.5 F (36.9 C) 98.9 F (37.2 C) 98.4 F (36.9 C) 97.8 F (36.6 C)  TempSrc: Oral Oral Oral   SpO2: 96% 92% 94% 97%  Weight:      Height:        Data Reviewed: I have reviewed patient's laboratory results as well as ultrasound of the  abdomen with findings as documented above  Family Communication: No family present at bedside today  Disposition: Status is: Inpatient Patient still continues to meet inpatient criteria given surgical management as well as NG tube requirement  Time spent: 55 minutes  Author: Loyce Dys, MD 05/07/2022 2:21 PM  For on call review [email or web address].

## 2022-05-07 NOTE — Progress Notes (Signed)
LIW suction resumed.  Cornell Barman Marshall Roehrich

## 2022-05-07 NOTE — Progress Notes (Signed)
[facility] PROGRESS NOTE (cpt [phone])  Hospital Day(s): 1.   Interval History: Patient seen and examined. Overnight had hypoglycemic event. Again this morning. Patient is relatively somnolent, arouses briefly. She reports continued abdominal pain. No fever, chills, nausea, emesis. Without leukocytosis; WBC 5.9K. Hgb to 11.2. Renal function normal; sCr - 0.72; UO - unmeasured x3. No electrolyte derangements. NGT output not recorded but discussed with RN and reportedly >1000 ccs. KUB with improved bowel gas pattern. No flatus nor BM.  Review of Systems:  Constitutional: denies fever, chills  HEENT: denies cough or congestion  Respiratory: denies any shortness of breath  Cardiovascular: denies chest pain or palpitations  Gastrointestinal: + abdominal pain, denied N/V Genitourinary: denies burning with urination or urinary frequency Musculoskeletal: denies pain, decreased motor or sensation  Vital signs in last 24 hours: [min-max] current  Temp:  [97.7 F (36.5 C)-98.9 F (37.2 C)] 98.4 F (36.9 C) (04/25 0357) Pulse Rate:  [64-92] 75 (04/25 0357) Resp:  [15-20] 18 (04/25 0357) BP: (115-139)/(41-65) 122/65 (04/25 0357) SpO2:  [92 %-100 %] 94 % (04/25 0357)     Height:  (172.7 cm) Weight: 49 kg     Intake/Output last 2 shifts:  04/24 0701 - 04/25 0700 In: 2542.1 [I.V.:1542.1; IV Piggyback:1000] Out: -    Physical Exam:  Constitutional: somnolent, arouses to verbal stimuli, NAD HENT: normocephalic without obvious abnormality; NGT in place  Eyes: PERRL, EOM's grossly intact and symmetric  Respiratory: breathing non-labored at rest  Cardiovascular: regular rate and sinus rhythm  Gastrointestinal: soft, non-tender, and non-distended, no rebound/guarding. She is certainly without peritonitis Musculoskeletal: no edema or wounds, motor and sensation grossly intact, NT    Labs:     Latest Ref Rng & Units 05/07/2022    6:34 AM 05/06/2022    4:50 AM 02/17/2022     9:59 AM  CBC  WBC 4.0 - 10.5 K/uL 5.9  12.8  6.4   Hemoglobin 12.0 - 15.0 g/dL 28.4  13.2  9.8   Hematocrit 36.0 - 46.0 % 36.0  42.4  30.6   Platelets 150 - 400 K/uL 273  360  359       Latest Ref Rng & Units 05/07/2022    6:34 AM 05/06/2022    4:50 AM 02/17/2022    9:59 AM  CMP  Glucose 70 - 99 mg/dL 73  440  102   BUN 8 - 23 mg/dL 27  32  14   Creatinine 0.44 - 1.00 mg/dL 7.25  3.66  4.40   Sodium 135 - 145 mmol/L 136  133  133   Potassium 3.5 - 5.1 mmol/L 4.2  4.3  3.8   Chloride 98 - 111 mmol/L 108  95  101   CO2 22 - 32 mmol/L Calcium 8.9 - 10.3 mg/dL 7.7  9.6  8.4   Total Protein 6.5 - 8.1 g/dL 5.8  7.1    Total Bilirubin 0.3 - 1.2 mg/dL 0.4  0.7    Alkaline Phos 38 - 126 U/L 70  90    AST 15 - 41 U/L 13  14    ALT 0 - 44 U/L 12  16       Imaging studies:   KUB (05/07/2022) personally reviewed with improved bowel gas pattern, and radiologist report pending...   Assessment/Plan: (ICD-10's: K62.609) 72 y.o. female with radiographically improving SBO   - Continue NGT decompression for now; LIS; monitor and record output -  NPO for now; IVF --> may need to switch to D5 NS given hypoglycemia  - No emergent surgical intervention; will follow progress - Monitor abdominal examination; on-going bowel function - Serial KUBs prn - Pain control prn; antiemetics prn              - Mobilize as tolerated              - Further management per primary service; we will follow               - DVT prophylaxis  All of the above findings and recommendations were discussed with the patient, and all of patient's questions were answered to her expressed satisfaction.  -- Lynden Oxford, PA-C [facility] 05/07/2022, 7:54 AM M-F: 7am - 4pm

## 2022-05-07 NOTE — Progress Notes (Signed)
Hypoglycemic Event  CBG: 43  Treatment: D50 50 mL (25 gm)  Symptoms: None  Follow-up CBG: Time:0045 CBG Result:200  Possible Reasons for Event: Other: NPO     Comments/MD notified:MD not notified, nursing protocol followed     Audley Hose

## 2022-05-07 NOTE — Consult Note (Signed)
ANTICOAGULATION CONSULT NOTE  Pharmacy Consult for transition from enoxaparin to IV heparin Indication: Portal vein occlusion   Allergies  Allergen Reactions   Cat Hair Extract Shortness Of Breath   Pregabalin Anaphylaxis   Erythromycin Other (See Comments)    Severe stomach upset    Patient Measurements: Height:  (172.7 cm) Weight: 49 kg (108 lb) IBW/kg (Calculated) : 63.9   Vital Signs: Temp: 97.8 F (36.6 C) (04/25 0821) Temp Source: Oral (04/25 0357) BP: 108/45 (04/25 0821) Pulse Rate: 72 (04/25 0821)  Labs: Recent Labs    05/06/22 0450 05/06/22 0840 05/06/22 1103 05/07/22 0634  HGB 13.9  --   --  11.2*  HCT 42.4  --   --  36.0  PLT 360  --   --  273  LABPROT  --   --  14.1  --   INR  --   --  1.1  --   CREATININE 0.84  --   --  0.72  TROPONINIHS 4 4  --   --      Estimated Creatinine Clearance: 50.6 mL/min (by C-G formula based on SCr of 0.72 mg/dL).   Medical History: Past Medical History:  Diagnosis Date   Anxiety    Arthritis    Asthma    Diabetes mellitus without complication    Insulin pump in place    Osteoporosis    RLS (restless legs syndrome)    Smokers' cough    Wears dentures    full upper and lower    Assessment: 72 yo female presented to ED with complaint of abdominal pain.  Patient being admitted for treatment of small bowel obstruction.  Imaging also revealed portal vein obstruction.  Pharmacy consulted to start therapeutic enoxaparin.  Goal of Therapy:  Heparin level 0.3-0.7 units/ml  Monitor platelets by anticoagulation protocol: Yes   Plan:  Stop enoxaparin  Start IV heparin at 850 units/hr beginning at next scheduled dose of enoxaparin: 04/25 2100 Hold IV heparin at 0200 05/08/22 for potential surgical intervention in am (MD order) Consider checking anti-Xa level if patient on prolonged therapy. CBC at lonce daily while on IV heparin  Lowella Bandy, PharmD 05/07/2022,11:30 AM

## 2022-05-07 NOTE — Consult Note (Signed)
Hospital Consult    Reason for Consult:  Portal Vein Occlusion  Requesting Physician:  Dr Rosezetta Schlatter MD MRN #:  [id number]  History of Present Illness: This is a 72 y.o. female with medical history significant of type 1 diabetes, asthma, tobacco abuse, neuropathy, chronic pain presenting with small bowel obstruction. Patient reports approximately 2 days of generalized abdominal pain, decreased p.o. intake, nausea and vomiting.  Patient denies any chest pain shortness of breath fever or chills at this time.  She does endorse nonbloody and nonbilious emesis.  Patient denies any alcohol use or drug use.  At baseline she is a 1+ pack smoker per day.    Past Medical History:  Diagnosis Date   Anxiety    Arthritis    Asthma    Diabetes mellitus without complication    Insulin pump in place    Osteoporosis    RLS (restless legs syndrome)    Smokers' cough    Wears dentures    full upper and lower    Past Surgical History:  Procedure Laterality Date   CATARACT EXTRACTION W/PHACO Right 02/20/2020   Procedure: CATARACT EXTRACTION PHACO AND INTRAOCULAR LENS PLACEMENT (IOC) RIGHT 5.60 00:36.3;  Surgeon: Galen Manila, MD;  Location: [facility];  Service: Ophthalmology;  Laterality: Right;  Diabetic - insulin pump Requests not first   CATARACT EXTRACTION W/PHACO Left 03/05/2020   Procedure: CATARACT EXTRACTION PHACO AND INTRAOCULAR LENS PLACEMENT (IOC) LEFT 4.94 00:31.6;  Surgeon: Galen Manila, MD;  Location: [facility];  Service: Ophthalmology;  Laterality: Left;   HIP PINNING,CANNULATED Right 02/09/2022   Procedure: PERCUTANEOUS FIXATION OF FEMORAL NECK;  Surgeon: Deeann Saint, MD;  Location: [facility];  Service: Orthopedics;  Laterality: Right;   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   ROTATOR CUFF REPAIR Left 2014 and 2015   SPINE SURGERY N/A 1984   L4-5, not sure of procedure   THYROID SURGERY      Allergies  Allergen Reactions   Cat Hair Extract  Shortness Of Breath   Pregabalin Anaphylaxis   Erythromycin Other (See Comments)    Severe stomach upset    Prior to Admission medications   Medication Sig Start Date End Date Taking? Authorizing Provider  acetaminophen (TYLENOL) 325 MG tablet Take 2 tablets (650 mg total) by mouth every 6 (six) hours as needed for mild pain, moderate pain, fever or headache (or Fever >/= 101). 02/17/22  Yes Gillis Santa, MD  albuterol (VENTOLIN HFA) 108 (90 Base) MCG/ACT inhaler Inhale into the lungs every 6 (six) hours as needed for wheezing or shortness of breath.   Yes [provider]  bisacodyl (DULCOLAX) 10 MG suppository Place 1 suppository (10 mg total) rectally daily as needed for severe constipation. 02/17/22  Yes Gillis Santa, MD  bisacodyl (DULCOLAX) 5 MG EC tablet Take 2 tablets (10 mg total) by mouth daily as needed for moderate constipation. 02/17/22  Yes Gillis Santa, MD  clonazePAM (KLONOPIN) 0.5 MG tablet Take 1 tablet (0.5 mg total) by mouth 2 (two) times daily as needed for anxiety. 02/17/22  Yes Gillis Santa, MD  cyanocobalamin 1000 MCG tablet Take 1 tablet (1,000 mcg total) by mouth daily. 02/21/22 05/22/22 Yes Gillis Santa, MD  cyclobenzaprine (FLEXERIL) 10 MG tablet Take 10 mg by mouth 3 (three) times daily as needed for muscle spasms.   Yes [provider]  diphenhydrAMINE HCl (BENADRYL ALLERGY PO) Take 50 mg by mouth as needed.   Yes [provider]  furosemide (LASIX)  20 MG tablet Take 20 mg by mouth daily as needed for edema or fluid. 12/31/21  Yes [provider]  gabapentin (NEURONTIN) 300 MG capsule Take 300 mg by mouth 4 (four) times daily.   Yes [provider]  hydrOXYzine (ATARAX) 25 MG tablet Take 25 mg by mouth 4 (four) times daily. 03/17/22  Yes [provider]  hydrOXYzine (ATARAX/VISTARIL) 10 MG tablet TAKE 1 OR 2 TABLET BY MOUTH THREE TIMES DAILY AS NEEDED FOR [facility] 03/19/20  Yes Willeen Niece, MD  insulin aspart (NOVOLOG) 100  UNIT/ML injection Inject into the skin 4 (four) times daily as needed for high blood sugar (with meals and at bedtime). Sliding scale   Yes [provider]  ipratropium (ATROVENT) 0.02 % nebulizer solution Take 0.5 mg by nebulization every 6 (six) hours as needed for wheezing or shortness of breath.   Yes [provider]  iron polysaccharides (NIFEREX) 150 MG capsule Take 1 capsule (150 mg total) by mouth daily. 02/17/22 05/18/22 Yes Gillis Santa, MD  Multiple Vitamin (MULTIVITAMIN ADULT PO) Take by mouth 2 (two) times daily.   Yes [provider]  Omega-3 Fatty Acids (FISH OIL) 1000 MG CAPS Take 1.5 capsules by mouth 2 (two) times daily. 08/04/19  Yes [provider]  pantoprazole (PROTONIX) 40 MG tablet Take 1 tablet (40 mg total) by mouth daily. 05/06/22 08/04/22 Yes Pilar Jarvis, MD  polyethylene glycol (MIRALAX) 17 g packet Take 17 g by mouth daily. 02/11/22  Yes Wouk, Wilfred Curtis, MD  polyvinyl alcohol (LIQUIFILM TEARS) 1.4 % ophthalmic solution Place 1 drop into both eyes as needed for dry eyes. 02/17/22  Yes Gillis Santa, MD  rOPINIRole (REQUIP) 1 MG tablet Take 1.5 mg by mouth at bedtime.   Yes [provider]  Saccharomyces boulardii (PROBIOTIC) 250 MG CAPS Take 1 capsule by mouth 3 times/day as needed-between meals & bedtime. 01/07/22  Yes Willy Eddy, MD  traMADol (ULTRAM) 50 MG tablet Take 1-2 tablets (50-100 mg total) by mouth 2 (two) times daily as needed. 03/10/22 06/08/22 Yes Edward Jolly, MD  Vitamin D, Ergocalciferol, (DRISDOL) 50000 UNITS CAPS capsule Take 50,000 Units by mouth 2 (two) times a week.   Yes [provider]  NOVOLOG 100 UNIT/ML injection pump Patient not taking: Reported on 05/06/2022 12/01/21   [provider]    Social History   Socioeconomic History   Marital status: Married    Spouse name: Not on file   Number of children: Not on file   Years of education: Not on file   Highest education level: Not  on file  Occupational History   Not on file  Tobacco Use   Smoking status: Every Day    Packs/day: 1.00    Years: 50.00    Additional pack years: 0.00    Total pack years: 50.00    Types: Cigarettes   Smokeless tobacco: Never   Tobacco comments:    started smoking age 89  Vaping Use   Vaping Use: Former   Devices: "tried itChiropractor and Sexual Activity   Alcohol use: No   Drug use: No   Sexual activity: Not on file  Other Topics Concern   Not on file  Social History Narrative   Not on file   Social Determinants of Health   Financial Resource Strain: Not on file  Food Insecurity: No Food Insecurity (05/06/2022)   Hunger Vital Sign    Worried About Running Out of Food in the Last Year:  Never true    Ran Out of Food in the Last Year: Never true  Transportation Needs: No Transportation Needs (05/06/2022)   PRAPARE - Administrator, Civil Service (Medical): No    Lack of Transportation (Non-Medical): No  Physical Activity: Not on file  Stress: Not on file  Social Connections: Not on file  Intimate Partner Violence: Not At Risk (05/06/2022)   Humiliation, Afraid, Rape, and Kick questionnaire    Fear of Current or Ex-Partner: No    Emotionally Abused: No    Physically Abused: No    Sexually Abused: No     Family History  Problem Relation Age of Onset   Parkinsonism Father     ROS: Otherwise negative unless mentioned in HPI  Physical Examination  Vitals:   05/07/22 0357 05/07/22 0821  BP: 122/65 (!) 108/45  Pulse: 75 72  Resp: 18 16  Temp: 98.4 F (36.9 C) 97.8 F (36.6 C)  SpO2: 94% 97%   Body mass index is 16.42 kg/m.  General:  WDWN in NAD Gait: Not observed HENT: WNL, normocephalic Pulmonary: normal non-labored breathing, without Rales, rhonchi,  wheezing Cardiac: regular, without  Murmurs, rubs or gallops; without carotid bruits Abdomen: Negative Bowel sounds, soft, Positive Tenderness/ND, no masses. Positive NG Tube to wall suction  with green bilious drainage.  Skin: without rashes Vascular Exam/Pulses: Palpable pulses throughout.  Extremities: without ischemic changes, without Gangrene , without cellulitis; without open wounds;  Musculoskeletal: no muscle wasting or atrophy  Neurologic: A&O X 3;  No focal weakness or paresthesias are detected; speech is fluent/normal Psychiatric:  The pt has Normal affect. Lymph:  Unremarkable  CBC    Component Value Date/Time   WBC 5.9 05/07/2022 0634   RBC 3.89 05/07/2022 0634   HGB 11.2 (L) 05/07/2022 0634   HCT 36.0 05/07/2022 0634   PLT 273 05/07/2022 0634   MCV 92.5 05/07/2022 0634   MCH 28.8 05/07/2022 0634   MCHC 31.1 05/07/2022 0634   RDW 16.7 (H) 05/07/2022 0634   LYMPHSABS 1.7 05/06/2022 0450   MONOABS 1.0 05/06/2022 0450   EOSABS 0.1 05/06/2022 0450   BASOSABS 0.0 05/06/2022 0450    BMET    Component Value Date/Time   NA 136 05/07/2022 0634   K 4.2 05/07/2022 0634   CL 108 05/07/2022 0634   CO2 24 05/07/2022 0634   GLUCOSE 73 05/07/2022 0634   BUN 27 (H) 05/07/2022 0634   CREATININE 0.72 05/07/2022 0634   CALCIUM 7.7 (L) 05/07/2022 0634   GFRNONAA >60 05/07/2022 0634   GFRAA >60 01/19/2018 1933    COAGS: Lab Results  Component Value Date   INR 1.1 05/06/2022   INR 1.2 02/08/2022     Non-Invasive Vascular Imaging:   EXAM: CT CHEST, ABDOMEN, AND PELVIS WITH CONTRAST  CT CHEST FINDINGS   Cardiovascular: The heart size is normal. No substantial pericardial effusion. Mild atherosclerotic calcification is noted in the wall of the thoracic aorta.   Mediastinum/Nodes: No mediastinal lymphadenopathy. There is no hilar lymphadenopathy. The esophagus has normal imaging features. There is no axillary lymphadenopathy.   Lungs/Pleura: 8 mm ground-glass nodule in the right upper lobe is stable since prior. 3 mm nodule in the right lower lobe (86/3) is new since previous study.   10 x 6 mm irregular spiculated nodule in the anterior right  lower lobe (77/3) is also new in the interval. 3 mm adjacent nodules in the left upper lobe (98/3) evident.   No focal airspace consolidation.  No pulmonary edema or pleural effusion.   Musculoskeletal: No worrisome lytic or sclerotic osseous abnormality. Thoracolumbar scoliosis evident.   CT ABDOMEN PELVIS FINDINGS   Hepatobiliary: Heterogeneous attenuation of liver parenchyma may be related to geographic fatty deposition or differential perfusion. 2.4 x 1.6 cm hyperattenuating focus in the posterior right liver (image 60/series 2) is new in the interval. Similar appearance of intrahepatic biliary duct dilatation with trace pneumobilia evident in the left hepatic lobe. Gallbladder surgically absent. Common bile duct does not appear to be substantially dilated.   Pancreas: Diffuse pancreatic parenchymal calcification is consistent with chronic pancreatitis. Pancreatic tail appears atrophic.   Spleen: No splenomegaly. No focal mass lesion.   Adrenals/Urinary Tract: Right adrenal gland unremarkable. Similar thickening of the left adrenal gland. Kidneys unremarkable. No evidence for hydroureter. The urinary bladder appears normal for the degree of distention.   Stomach/Bowel: Stomach is markedly dilated with food, fluid, and gas. Duodenum is dilated. Proximal small bowel loops are fluid-filled and dilated up to 3.8 cm diameter. Dilated small bowel loops extend down into the right pelvis where there is a transition zone (see axial image 100/2) extending towards a small, wide-mouth right-sided sciatic hernia of the pelvis (image 100/2). Decompressed small bowel is noted in the central and left pelvis. Colon is largely decompressed with small volume gas and stool seen along its course to the level of the rectum. Surgical changes are noted in the low rectum.   Vascular/Lymphatic: There is moderate atherosclerotic calcification of the abdominal aorta without aneurysm. 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 There is no gastrohepatic or hepatoduodenal ligament lymphadenopathy. No retroperitoneal or mesenteric lymphadenopathy.   Reproductive: No evidence for an adnexal mass.   Other: No substantial intraperitoneal free fluid.   Musculoskeletal: As above, the patient is noted to have a right sciatic hernia. There is also a shallow left-sided sciatic hernia. Pelvic floor laxity evident. Fixation hardware noted in the hips bilaterally. Bones are diffusely demineralized. Right-sided sacral insufficiency fracture is similar to prior. Degenerative changes noted lower lumbar spine.   IMPRESSION: 1. Small bowel obstruction with transition zone in the posterior right pelvis extending towards a small, wide-mouth right-sided sciatic hernia of the pelvis. As such, mechanical obstruction is favored either from adhesion or the shallow right sided sciatic hernia 2. 2.4 x 1.6 cm hyperattenuating focus in the posterior right liver is new in the interval. This is indeterminate and may represent a focal area of fatty sparing or lesion with diffuse enhancement. General background heterogeneous enhancement of liver parenchyma. Consider abdominal MRI with and without contrast after resolution of acute symptoms to further evaluate. 3. New bilateral pulmonary nodules measuring up to 10 x 6 mm in the anterior right lower lobe. Non-contrast chest CT at 3-6 months is recommended. If the nodules are stable at time of repeat CT, then future CT at 18-24 months (from today's scan) is considered optional for low-risk patients, but is recommended for high-risk patients. This recommendation follows the consensus statement: Guidelines for Management of Incidental Pulmonary Nodules Detected on CT Images: From the Fleischner Society 2017; Radiology 2017; 284:228-243. 4. Apparent short segment occlusion of the portal vein near the portosplenic  confluence with some cavernous transformation in the porta hepatis. 5. Pelvic floor laxity. 6.  Aortic Atherosclerosis (ICD10-I70.0).  Statin:  No. Beta Blocker:  No. Aspirin:  No. ACEI:  No. ARB:  No. CCB use:  No Other antiplatelets/anticoagulants:  No.    ASSESSMENT/PLAN: This is a 72 y.o.  female chronic pain presenting with small bowel obstruction. Patient reports approximately 2 days of generalized abdominal pain, decreased p.o. intake, nausea and vomiting. Patient noted to have small bowel obstruction at this time. On CT Scan she is noted to have a segmental occlusion of the portal vein.  At this point in time the patient is not appear to have any portal hypertension.  I cannot appreciate a large spleen or any abdominal ascites.  She does not have any cognitive impairment and answers all my questions appropriately.  PLAN: Treatment of Portal vein occlusion is initially with a heparin infusion if patient can tolerate without creating other bleeding complications or her needing possible bowel surgery to correct the bowel obstruction. Once the bowel obstruction has resolved or been fixed the patient may require long term anticoagulation therapy.  If the patient at some point in future develops portal hypertension recommendation is a TIPS procedure.   -I discussed the plan in detail with Dr. Vilinda Flake MD and he is in agreement with the plan   Marcie Bal [facility] 05/07/2022 2:53 PM

## 2022-05-07 NOTE — Progress Notes (Signed)
Medium size BM. Mushy

## 2022-05-07 NOTE — Progress Notes (Signed)
Gastrografin administered, head of bed elevated, NGT clamped, X-ray notified of administration.  Cornell Barman Carley Strickling
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
## 2022-05-07 NOTE — Consult Note (Signed)
ANTICOAGULATION CONSULT NOTE  Pharmacy Consult for transition from enoxaparin to IV heparin Indication: Portal vein occlusion   Allergies  Allergen Reactions   Cat Hair Extract Shortness Of Breath   Pregabalin Anaphylaxis   Erythromycin Other (See Comments)    Severe stomach upset    Patient Measurements: Height:  (172.7 cm) Weight: 49 kg (108 lb) IBW/kg (Calculated) : 63.9   Vital Signs: Temp: 99 F (37.2 C) (04/25 1951) BP: 128/52 (04/25 1950) Pulse Rate: 61 (04/25 1950)  Labs: Recent Labs    05/06/22 0450 05/06/22 0840 05/06/22 1103 05/07/22 0634  HGB 13.9  --   --  11.2*  HCT 42.4  --   --  36.0  PLT 360  --   --  273  LABPROT  --   --  14.1  --   INR  --   --  1.1  --   CREATININE 0.84  --   --  0.72  TROPONINIHS 4 4  --   --      Estimated Creatinine Clearance: 50.6 mL/min (by C-G formula based on SCr of 0.72 mg/dL).   Medical History: Past Medical History:  Diagnosis Date   Anxiety    Arthritis    Asthma    Diabetes mellitus without complication (HCC)    Insulin pump in place    Osteoporosis    RLS (restless legs syndrome)    Smokers' cough (HCC)    Wears dentures    full upper and lower    Assessment: 72 yo female presented to ED with complaint of abdominal pain.  Patient being admitted for treatment of small bowel obstruction.  Imaging also revealed portal vein obstruction.  Pharmacy consulted to start therapeutic enoxaparin.  Goal of Therapy:  Heparin level 0.3-0.7 units/ml  Monitor platelets by anticoagulation protocol: Yes   Plan:  Stop enoxaparin  Start IV heparin at 850 units/hr beginning at next scheduled dose of enoxaparin: 04/25 2100 Heparin gtt started 1 hr late (~ 2200) due to loss of IV access  Hold IV heparin at 0200 05/08/22 for potential surgical intervention in am (MD order) Consider checking anti-Xa level if patient on prolonged therapy. CBC at lonce daily while on IV heparin  Latisa Belay D,  PharmD 05/07/2022,11:04 PM
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
## 2022-05-08 ENCOUNTER — Encounter
Admission: EM | Disposition: A | Discharge status: Home / Self Care | Payer: Self-pay | Source: Home / Self Care | Attending: Internal Medicine

## 2022-05-08 DIAGNOSIS — K56609 Unspecified intestinal obstruction, unspecified as to partial versus complete obstruction: Secondary | ICD-10-CM | POA: Diagnosis not present

## 2022-05-08 LAB — CARDIOLIPIN ANTIBODIES, IGG, IGM, IGA
Anticardiolipin IgA: <9 APL U/mL (ref 0–11)
Anticardiolipin IgG: <9 GPL U/mL (ref 0–14)
Anticardiolipin IgM: 33 MPL U/mL — ABNORMAL HIGH (ref 0–12)

## 2022-05-08 LAB — BASIC METABOLIC PANEL
Anion gap: 8 (ref 5–15)
BUN: 15 mg/dL (ref 8–23)
CO2: 23 mmol/L (ref 22–32)
Calcium: 7.1 mg/dL — ABNORMAL LOW (ref 8.9–10.3)
Chloride: 111 mmol/L (ref 98–111)
Creatinine, Ser: 0.49 mg/dL (ref 0.44–1.00)
GFR, Estimated: >60 mL/min (ref >60)
Glucose, Bld: 185 mg/dL — ABNORMAL HIGH (ref 70–99)
Potassium: 3.8 mmol/L (ref 3.5–5.1)
Sodium: 142 mmol/L (ref 135–145)

## 2022-05-08 LAB — CBC WITH DIFFERENTIAL/PLATELET
Abs Immature Granulocytes: 0.01 10*3/uL (ref 0.00–0.07)
Basophils Absolute: 0 10*3/uL (ref 0.0–0.1)
Basophils Relative: 1 %
Eosinophils Absolute: 0.1 10*3/uL (ref 0.0–0.5)
Eosinophils Relative: 4 %
HCT: 39.1 % (ref 36.0–46.0)
Hemoglobin: 11.9 g/dL — ABNORMAL LOW (ref 12.0–15.0)
Immature Granulocytes: 0 %
Lymphocytes Relative: 32 %
Lymphs Abs: 1.2 10*3/uL (ref 0.7–4.0)
MCH: 28.7 pg (ref 26.0–34.0)
MCHC: 30.4 g/dL (ref 30.0–36.0)
MCV: 94.2 fL (ref 80.0–100.0)
Monocytes Absolute: 0.4 10*3/uL (ref 0.1–1.0)
Monocytes Relative: 11 %
Neutro Abs: 2.1 10*3/uL (ref 1.7–7.7)
Neutrophils Relative %: 52 %
Platelets: 260 10*3/uL (ref 150–400)
RBC: 4.15 MIL/uL (ref 3.87–5.11)
RDW: 16.4 % — ABNORMAL HIGH (ref 11.5–15.5)
WBC: 3.9 10*3/uL — ABNORMAL LOW (ref 4.0–10.5)
nRBC: 0 % (ref 0.0–0.2)

## 2022-05-08 LAB — GLUCOSE, CAPILLARY
Glucose-Capillary: 160 mg/dL — ABNORMAL HIGH (ref 70–99)
Glucose-Capillary: 202 mg/dL — ABNORMAL HIGH (ref 70–99)
Glucose-Capillary: 230 mg/dL — ABNORMAL HIGH (ref 70–99)
Glucose-Capillary: 245 mg/dL — ABNORMAL HIGH (ref 70–99)
Glucose-Capillary: 270 mg/dL — ABNORMAL HIGH (ref 70–99)
Glucose-Capillary: 336 mg/dL — ABNORMAL HIGH (ref 70–99)

## 2022-05-08 LAB — BETA-2-GLYCOPROTEIN I ABS, IGG/M/A
Beta-2 Glyco I IgG: <9 GPI IgG units (ref 0–20)
Beta-2-Glycoprotein I IgA: <9 GPI IgA units (ref 0–25)
Beta-2-Glycoprotein I IgM: 34 GPI IgM units — ABNORMAL HIGH (ref 0–32)

## 2022-05-08 LAB — PROTEIN C, TOTAL: Protein C, Total: 82 % (ref 60–150)

## 2022-05-08 SURGERY — LAPAROTOMY, EXPLORATORY
Anesthesia: General

## 2022-05-08 MED ORDER — HEPARIN (PORCINE) 25000 UT/250ML-% IV SOLN: 12.0000 [IU]/kg/h | INTRAVENOUS | Status: DC

## 2022-05-08 MED ORDER — APIXABAN 5 MG PO TABS: 10.0000 mg | ORAL_TABLET | Freq: Two times a day (BID) | ORAL | Status: DC

## 2022-05-08 MED ORDER — PANTOPRAZOLE SODIUM 40 MG IV SOLR
40.0000 mg | Freq: Two times a day (BID) | INTRAVENOUS | Status: DC
  Administered 2022-05-08 – 2022-05-10 (×6): 40 mg via INTRAVENOUS
  Filled 2022-05-08 (×6): qty 10

## 2022-05-08 MED ORDER — ROPINIROLE HCL 1 MG PO TABS
1.5000 mg | ORAL_TABLET | Freq: Every day | ORAL | Status: DC
  Administered 2022-05-08 – 2022-05-10 (×3): 1.5 mg via ORAL
  Filled 2022-05-08 (×3): qty 2

## 2022-05-08 MED ORDER — APIXABAN 5 MG PO TABS: 5.0000 mg | ORAL_TABLET | Freq: Two times a day (BID) | ORAL | Status: DC

## 2022-05-08 MED ORDER — HEPARIN (PORCINE) 25000 UT/250ML-% IV SOLN
850.0000 [IU]/h | INTRAVENOUS | Status: DC
  Administered 2022-05-08: 850 [IU]/h via INTRAVENOUS

## 2022-05-08 MED ORDER — APIXABAN 5 MG PO TABS
10.0000 mg | ORAL_TABLET | Freq: Two times a day (BID) | ORAL | Status: DC
  Administered 2022-05-08 – 2022-05-11 (×7): 10 mg via ORAL
  Filled 2022-05-08 (×7): qty 2

## 2022-05-08 MED ORDER — DEXTROSE-NACL 5-0.9 % IV SOLN: INTRAVENOUS | Status: DC

## 2022-05-08 NOTE — Evaluation (Signed)
Physical Therapy Evaluation Patient Details Name: Suzanne Snyder MRN: [id number] DOB: 11-Jul-1950 Today's Date: 05/08/2022  History of Present Illness  Pt is a 72 y.o. female presenting to hospital 05/06/22 with c/o abdominal pain, N/V, and chest pain.  Pt admitted with SBO and portal vein obstruction.  PMH includes type I diabetic on insulin pump, anxiety, chronic pain, RLS, R cannulated hip pinning 02/09/22, L THR, L RCR, spine surgery, neuropathy.  Clinical Impression  Prior to hospital admission, pt was modified independent ambulating with 2 walking sticks; pt reports still having PWB'ing R LE precautions from R hip surgery 02/09/22; lives alone in 1 level home with steps to enter; pt reports no recent falls.  Currently pt is modified independent with bed mobility; CGA with transfers using RW; and CGA to ambulate 25 feet with RW use (therapist limited pt's distance ambulating d/t recent elevated BP).  Pt steady with functional mobility using RW during session.  Pt would currently benefit from skilled PT to address noted impairments and functional limitations (see below for any additional details).  Upon hospital discharge, pt would benefit from ongoing therapy.    Recommendations for follow up therapy are one component of a multi-disciplinary discharge planning process, led by the attending physician.  Recommendations may be updated based on patient status, additional functional criteria and insurance authorization.        Assistance Recommended at Discharge PRN  Patient can return home with the following  Help with stairs or ramp for entrance;Assist for transportation    Equipment Recommendations Other (comment) (pt has RW at home already)  Recommendations for Other Services       Functional Status Assessment Patient has had a recent decline in their functional status and demonstrates the ability to make significant improvements in function in a reasonable and predictable amount of time.      Precautions / Restrictions Precautions Precautions: Fall Restrictions Weight Bearing Restrictions: Yes RLE Weight Bearing: Partial weight bearing Other Position/Activity Restrictions: Pt reports still PWB'ing from R hip surgery 02/09/22.      Mobility  Bed Mobility Overal bed mobility: Modified Independent             General bed mobility comments: Supine to/from sitting without any noted difficulties    Transfers Overall transfer level: Needs assistance Equipment used: Rolling walker (2 wheels) Transfers: Sit to/from Stand, Bed to chair/wheelchair/BSC Sit to Stand: Min guard   Step pivot transfers: Min guard (stand step turn with RW bed to/from recliner)       General transfer comment: vc's for UE placement    Ambulation/Gait Ambulation/Gait assistance: Min guard Gait Distance (Feet): 25 Feet Assistive device: Rolling walker (2 wheels)   Gait velocity: decreased     General Gait Details: partial step through gait pattern; decreased stance time and WB'ing R LE (pt PWB'ing R LE); steady with RW use  Stairs            Wheelchair Mobility    Modified Rankin (Stroke Patients Only)       Balance Overall balance assessment: Needs assistance Sitting-balance support: No upper extremity supported, Feet supported Sitting balance-Leahy Scale: Normal Sitting balance - Comments: steady sitting reaching outside BOS   Standing balance support: Bilateral upper extremity supported, During functional activity, Reliant on assistive device for balance Standing balance-Leahy Scale: Good Standing balance comment: steady ambulating with RW use  Pertinent Vitals/Pain Pain Assessment Pain Assessment: Faces Faces Pain Scale: Hurts a little bit Pain Location: R hip Pain Descriptors / Indicators: Sore Pain Intervention(s): Limited activity within patient's tolerance, Monitored during session, Premedicated before session,  Repositioned Vitals (HR and O2 on room air) stable and WFL throughout treatment session.    Home Living Family/patient expects to be discharged to:: Private residence Living Arrangements: Alone   Type of Home: House Home Access: Stairs to enter   [employer] of Steps: 4 steps with L railing from garage entrance; 6 steps with B railings (unable to reach both at same time) from front entrance   Home Layout: One level Home Equipment: Agricultural consultant (2 wheels);Rollator (4 wheels);Grab bars - tub/shower;Hand held shower head;BSC/3in1;Tub bench;Wheelchair - manual;Shower seat - built in;Grab bars - toilet Additional Comments: bed rail; stool with rail (to get in/out of higher bed); 2 walking sticks    Prior Function Prior Level of Function : Independent/Modified Independent             Mobility Comments: Modified independent ambulating with 2 walking sticks.  No recent falls reported. ADLs Comments: Ind with self care and IADLs     Hand Dominance        Extremity/Trunk Assessment   Upper Extremity Assessment Upper Extremity Assessment: Overall WFL for tasks assessed    Lower Extremity Assessment Lower Extremity Assessment: Generalized weakness    Cervical / Trunk Assessment Cervical / Trunk Assessment: Normal  Communication   Communication: No difficulties  Cognition Arousal/Alertness: Awake/alert Behavior During Therapy: WFL for tasks assessed/performed Overall Cognitive Status: Within Functional Limits for tasks assessed                                 General Comments: Talkative (requires redirection at times)        General Comments  Nursing cleared pt for participation in physical therapy.  Pt agreeable to PT session.    Exercises     Assessment/Plan    PT Assessment Patient needs continued PT services  PT Problem List Decreased strength;Decreased activity tolerance;Decreased balance;Decreased mobility;Pain       PT Treatment  Interventions DME instruction;Gait training;Stair training;Functional mobility training;Therapeutic activities;Therapeutic exercise;Balance training;Patient/family education    PT Goals (Current goals can be found in the Care Plan section)  Acute Rehab PT Goals Patient Stated Goal: to go home from hospital PT Goal Formulation: With patient Time For Goal Achievement: 05/22/22 Potential to Achieve Goals: Good    Frequency Min 2X/week     Co-evaluation               AM-PAC PT "6 Clicks" Mobility  Outcome Measure Help needed turning from your back to your side while in a flat bed without using bedrails?: None Help needed moving from lying on your back to sitting on the side of a flat bed without using bedrails?: None Help needed moving to and from a bed to a chair (including a wheelchair)?: A Little Help needed standing up from a chair using your arms (e.g., wheelchair or bedside chair)?: A Little Help needed to walk in hospital room?: A Little Help needed climbing 3-5 steps with a railing? : A Little 6 Click Score: 20    End of Session Equipment Utilized During Treatment: Gait belt Activity Tolerance: Patient tolerated treatment well Patient left: in bed;with call bell/phone within reach;with bed alarm set Nurse Communication: Mobility status;Precautions PT Visit Diagnosis: Other abnormalities of  gait and mobility (R26.89);Muscle weakness (generalized) (M62.81);History of falling (Z91.81);Pain Pain - Right/Left: Right Pain - part of body: Hip    Time: 1530-1559 PT Time Calculation (min) (ACUTE ONLY): 29 min   Charges:   PT Evaluation $PT Eval Low Complexity: 1 Low PT Treatments $Therapeutic Activity: 8-22 mins       Hendricks Limes, PT 05/08/22, 4:29 PM

## 2022-05-08 NOTE — Care Management Important Message (Signed)
Important Message  Patient Details  Name: Suzanne Snyder MRN: [id number] Date of Birth: 05/09/1950   Medicare Important Message Given:  N/A - LOS <3 / Initial given by admissions     Johnell Comings 05/08/2022, 11:59 AM

## 2022-05-08 NOTE — Progress Notes (Signed)
[facility] SURGICAL PROGRESS NOTE (cpt [phone])  Hospital Day(s): 2.   Interval History: Patient seen and examined. No acute events overnight. Patient resting in bed; reports abdominal pain improved. No fever, chills, nausea, emesis. Without leukocytosis; WBC 3.9K. Hgb to 11.9. Renal function normal; sCr - 0.49; UO - unmeasured x3. No electrolyte derangements. NGT output 300 ccs. She did pass gastrografin challenge. She is having numerous bowel movements.   Review of Systems:  Constitutional: denies fever, chills  HEENT: denies cough or congestion  Respiratory: denies any shortness of breath  Cardiovascular: denies chest pain or palpitations  Gastrointestinal: + abdominal pain (improved), denied N/V Genitourinary: denies burning with urination or urinary frequency Musculoskeletal: denies pain, decreased motor or sensation  Vital signs in last 24 hours: [min-max] current  Temp:  [97.8 F (36.6 C)-99 F (37.2 C)] 98.5 F (36.9 C) (04/26 0424) Pulse Rate:  [61-84] 84 (04/26 0424) Resp:  [16-18] 18 (04/26 0424) BP: (108-136)/(45-61) 136/55 (04/26 0424) SpO2:  [94 %-97 %] 95 % (04/26 0424)     Height: 5\' 8"  (172.7 cm) Weight: 49 kg     Intake/Output last 2 shifts:  04/25 0701 - 04/26 0700 In: 898.7 [I.V.:898.7] Out: 300 [Emesis/NG output:300]   Physical Exam:  Constitutional: somnolent, arouses to verbal stimuli, NAD HENT: normocephalic without obvious abnormality; NGT in place  Eyes: PERRL, EOM's grossly intact and symmetric  Respiratory: breathing non-labored at rest  Cardiovascular: regular rate and sinus rhythm  Gastrointestinal: soft, non-tender, and non-distended, no rebound/guarding. She is certainly without peritonitis Musculoskeletal: no edema or wounds, motor and sensation grossly intact, NT    Labs:     Latest Ref Rng & Units 05/08/2022    3:36 AM 05/07/2022    6:34 AM 05/06/2022    4:50 AM  CBC  WBC 4.0 - 10.5 K/uL 3.9  5.9  12.8   Hemoglobin 12.0  - 15.0 g/dL 52.8  41.3  24.4   Hematocrit 36.0 - 46.0 % 39.1  36.0  42.4   Platelets 150 - 400 K/uL 260  273  360       Latest Ref Rng & Units 05/08/2022    3:36 AM 05/07/2022    6:34 AM 05/06/2022    4:50 AM  CMP  Glucose 70 - 99 mg/dL 010  73  272   BUN 8 - 23 mg/dL 15  27  32   Creatinine 0.44 - 1.00 mg/dL 5.36  6.44  0.34   Sodium 135 - 145 mmol/L 142  136  133   Potassium 3.5 - 5.1 mmol/L 3.8  4.2  4.3   Chloride 98 - 111 mmol/L 111  108  95   CO2 22 - 32 mmol/L 23  24  26    Calcium 8.9 - 10.3 mg/dL 7.1  7.7  9.6   Total Protein 6.5 - 8.1 g/dL  5.8  7.1   Total Bilirubin 0.3 - 1.2 mg/dL  0.4  0.7   Alkaline Phos 38 - 126 U/L  70  90   AST 15 - 41 U/L  13  14   ALT 0 - 44 U/L  12  16      Imaging studies:   KUB w/ gastrografin 8-hr delay (05/07/2022) personally reviewed with contrast throughout colon, no evidence of obstruction, and radiologist report reviewed:  IMPRESSION: Contrast material demonstrated throughout the colon suggesting no complete small bowel obstruction.   Assessment/Plan: (ICD-10's: K34.609) 72 y.o. female with radiographically improving/resolved SBO   - Will proceed  with NGT clamping trial; Clamp NGT x4 hours. If residuals after 4 hours are <150 ccs, we can remove this and start diet  - NPO for now. If passes clamping trial, okay for CLD - Added PPI - No emergent surgical intervention; will follow progress - Monitor abdominal examination; on-going bowel function - Pain control prn; antiemetics prn              - Mobilize as tolerated   - Appreciate vascular recommendations              - Further management per primary service; we will follow    All of the above findings and recommendations were discussed with the patient, and all of patient's questions were answered to her expressed satisfaction.  -- Lynden Oxford, PA-C [facility] 05/08/2022, 7:39 AM M-F: 7am - 4pm

## 2022-05-08 NOTE — Progress Notes (Signed)
Progress Note   Patient: Suzanne Snyder ZOX:[id number] DOB: 1950-03-17 DOA: 05/06/2022     2 DOS: the patient was seen and examined on 05/08/2022     Subjective:  Patient seen and examined at bedside this morning Appears acutely ill Had episode of hypoglycemia this morning however repeat sugars have been within normal limit Currently having NG tube in place Denied worsening abdominal pain chest pain or cough   Brief hospital course: From HPI "Suzanne Snyder is a 72 y.o. female with medical history significant of type 1 diabetes, asthma, tobacco abuse, neuropathy, chronic pain presenting with small bowel obstruction.  Patient ports approximately 2 days of generalized abdominal pain, decreased p.o. intake, nausea and vomiting.  No fevers or chills.  No chest pain.  No reported diarrhea.  Emesis nonbloody nonbilious.  Patient reports that she may have had prior symptoms like this in the past.  Denies any prior abdominal surgeries- though is s/p cholecystectomy on imaging.  Symptoms progressively worsening.  No reported change in diet.  Has not been checking sugars at home.  No reported alcohol use.  Baseline 1+ pack per day smoker.  No reported alcohol use. Presented to the ER afebrile, hemodynamically stable.  White count 12.8, hemoglobin 14, platelets 360, creatinine 0.84, glucose 153.  Chest x-ray grossly stable.  CT chest abdomen pelvis with concern for small right mechanical bowel obstruction.  Also with 2.4 x 1 6 cm hypoattenuating focus in the right posterior liver with recommended for MRI imaging after resolution of symptoms.  Imaging also with short segment occlusion of the portal vein near the porta splenic confluence with some cavernous transformation in the porta hepatis.."       Assessment and Plan: * SBO (small bowel obstruction) Worsening abd pain x 2 days w/ noted small bowel obstruction on imaging  Also w/ ? Short segment portal vein occlusion on imaging.  Pain control   Antiemetics  General surgery team on board we appreciate input Continue NG tube management as recommended by surgeon   Hypoglycemia-resolved Patient received several doses of D50 infusion  Portal vein obstruction Noted portal vein occlusion on imaging Suspect may be confounder to abdominal pain in the setting of small bowel obstruction Case preliminary discussed with Suzanne Plumber, NP with vascular surgery Chronicity relatively unclear Patient has been initiated on Eliquis No reported alcohol use Follow-up hypercoagulable panel Showing dilated and torturous vessels at the porta hepatis likely suggestive of collateral formation Vascular surgeon been consulted for input   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  HENT:     Head: Normocephalic and atraumatic.  Eyes:     Pupils: Pupils are equal, round, and reactive to light.  Cardiovascular:     Rate and Rhythm: Normal rate and regular rhythm.  Pulmonary:     Effort: Pulmonary effort is normal.  Abdominal:     General: Bowel sounds are normal.     Comments: + mild generalized abdominal tenderness Musculoskeletal:        General: Normal range of motion.  Skin:    General: Skin  is dry.  Neurological:     General: Appears lethargic    Data Reviewed: I have reviewed patient's laboratory results showing sodium 142 potassium 3.8 glucose 185, WBC 3.9 and hemoglobin 11.9   Family Communication: I called the patient's daughter at the number listed however she was unable to answer at this time   Disposition: Status is: Inpatient Patient still continues to meet inpatient  criteria given surgical management as well as NG tube requirement   Time spent: 50 minutes  Vitals:   05/07/22 1549 05/07/22 1950 05/08/22 0424 05/08/22 0741  BP: 134/61 (!) 128/52 (!) 136/55 (!) 126/49  Pulse: 71 61 84 84  Resp: 16 18 18 10   Temp: 98.2 F (36.8 C) 99 F (37.2 C) 98.5 F (36.9 C) 98.5 F (36.9 C)  TempSrc:   Oral Oral  SpO2: 95% 94% 95% 98%  Weight:      Height:         Author: Loyce Dys, MD 05/08/2022 3:02 PM  For on call review [email or web address].

## 2022-05-08 NOTE — TOC Progression Note (Signed)
Transition of Care [facility]) - Progression Note    Patient Details  Name: Suzanne Snyder MRN: [id number] Date of Birth: 1950-01-15  Transition of Care [facility]) CM/SW Contact  Margarito Liner, LCSW Phone Number: 05/08/2022, 4:28 PM  Clinical Narrative:  DSS came to assess patient today. Patient told social worker, Junious Silk, that she wants to go to rehab. PT evaluated and feel like she can go home.   Expected Discharge Plan: Home w Home Health Services Barriers to Discharge: Continued Medical Work up  Expected Discharge Plan and Services     Post Acute Care Choice: Home Health Living arrangements for the past 2 months: Single Family Home                           HH Arranged: PT, OT, Nurse's Aide, Social Work [employer] Agency: [facility] ([facility]) Date HH Agency Contacted: 05/07/22   Representative spoke with at [facility] Agency: Barbara Cower   Social Determinants of Health (SDOH) Interventions SDOH Screenings   Food Insecurity: No Food Insecurity (05/06/2022)  Housing: Low Risk  (05/06/2022)  Transportation Needs: No Transportation Needs (05/06/2022)  Utilities: Not At Risk (05/06/2022)  Depression (PHQ2-9): Low Risk  (03/10/2022)  Tobacco Use: High Risk (05/06/2022)    Readmission Risk Interventions    05/07/2022    2:44 PM  Readmission Risk Prevention Plan  Transportation Screening Complete  Medication Review (RN Care Manager) Complete  HRI or Home Care Consult Complete  SW Recovery Care/Counseling Consult Complete  Palliative Care Screening Not Applicable

## 2022-05-08 NOTE — Consult Note (Signed)
ANTICOAGULATION CONSULT NOTE  Pharmacy Consult for transition from enoxaparin to IV heparin Indication: Portal vein occlusion   Allergies  Allergen Reactions   Cat Hair Extract Shortness Of Breath   Pregabalin Anaphylaxis   Erythromycin Other (See Comments)    Severe stomach upset    Patient Measurements: Height: 5\' 8"  (172.7 cm) Weight: 49 kg (108 lb) IBW/kg (Calculated) : 63.9   Vital Signs: Temp: 98.5 F (36.9 C) (04/26 0741) Temp Source: Oral (04/26 0741) BP: 126/49 (04/26 0741) Pulse Rate: 84 (04/26 0741)  Labs: Recent Labs    05/06/22 0450 05/06/22 0840 05/06/22 1103 05/07/22 0634 05/08/22 0336  HGB 13.9  --   --  11.2* 11.9*  HCT 42.4  --   --  36.0 39.1  PLT 360  --   --  273 260  LABPROT  --   --  14.1  --   --   INR  --   --  1.1  --   --   CREATININE 0.84  --   --  0.72 0.49  TROPONINIHS 4 4  --   --   --      Estimated Creatinine Clearance: 50.6 mL/min (by C-G formula based on SCr of 0.49 mg/dL).   Medical History: Past Medical History:  Diagnosis Date   Anxiety    Arthritis    Asthma    Diabetes mellitus without complication (HCC)    Insulin pump in place    Osteoporosis    RLS (restless legs syndrome)    Smokers' cough (HCC)    Wears dentures    full upper and lower    Assessment: 72 yo female presented to ED with complaint of abdominal pain.  Patient being admitted for treatment of small bowel obstruction.  Imaging also revealed portal vein obstruction.  Pharmacy was previously consulted to start therapeutic enoxaparin that was subsequently changed to IV heparin. Heparin was stopped overnight in anticipation of a surgical procedure  Goal of Therapy:  Heparin level 0.3-0.7 units/ml  Monitor platelets by anticoagulation protocol: Yes   Plan:  restart IV heparin at 850 units/hr  check anti-Xa level in 8 hours after restarting CBC at least once daily while on IV heparin  Lowella Bandy, PharmD 05/08/2022,8:41 AM

## 2022-05-08 NOTE — Progress Notes (Signed)
Patient's blood glucose was 270 and sliding scale coverage was to be 5 units of novolog. Patient adamantly refused 5 units of insulin stating that this would drop her "about 500 points". Patient then stated that the most she "would take would be 2 1/2 units at most". RN educated that the MD would have to be contacted if she wanted coverage outside of what the sliding scale states and that insulin coverage is done in whole numbered units and we are unable to administer half of a unit. Patient was upset that she could not pick her own coverage of 2 1/2 units of novolog and then refused to have any coverage. Patient states that we will see what her next reading is and cover her CBG with insulin at that time.

## 2022-05-09 DIAGNOSIS — K56609 Unspecified intestinal obstruction, unspecified as to partial versus complete obstruction: Secondary | ICD-10-CM | POA: Diagnosis not present

## 2022-05-09 LAB — URINALYSIS, ROUTINE W REFLEX MICROSCOPIC
Bilirubin Urine: NEGATIVE
Glucose, UA: >=500 mg/dL — AB
Hgb urine dipstick: NEGATIVE
Ketones, ur: NEGATIVE mg/dL
Leukocytes,Ua: NEGATIVE
Nitrite: NEGATIVE
Protein, ur: NEGATIVE mg/dL
Specific Gravity, Urine: 1.011 (ref 1.005–1.030)
pH: 6 (ref 5.0–8.0)

## 2022-05-09 LAB — BASIC METABOLIC PANEL
Anion gap: 9 (ref 5–15)
BUN: 9 mg/dL (ref 8–23)
CO2: 19 mmol/L — ABNORMAL LOW (ref 22–32)
Calcium: 6.6 mg/dL — ABNORMAL LOW (ref 8.9–10.3)
Chloride: 107 mmol/L (ref 98–111)
Creatinine, Ser: 0.49 mg/dL (ref 0.44–1.00)
GFR, Estimated: >60 mL/min (ref >60)
Glucose, Bld: 203 mg/dL — ABNORMAL HIGH (ref 70–99)
Potassium: 4.3 mmol/L (ref 3.5–5.1)
Sodium: 135 mmol/L (ref 135–145)

## 2022-05-09 LAB — CBC WITH DIFFERENTIAL/PLATELET
Abs Immature Granulocytes: 0.01 10*3/uL (ref 0.00–0.07)
Basophils Absolute: 0 10*3/uL (ref 0.0–0.1)
Basophils Relative: 1 %
Eosinophils Absolute: 0.2 10*3/uL (ref 0.0–0.5)
Eosinophils Relative: 4 %
HCT: 37.8 % (ref 36.0–46.0)
Hemoglobin: 11.5 g/dL — ABNORMAL LOW (ref 12.0–15.0)
Immature Granulocytes: 0 %
Lymphocytes Relative: 38 %
Lymphs Abs: 1.6 10*3/uL (ref 0.7–4.0)
MCH: 28.5 pg (ref 26.0–34.0)
MCHC: 30.4 g/dL (ref 30.0–36.0)
MCV: 93.8 fL (ref 80.0–100.0)
Monocytes Absolute: 0.4 10*3/uL (ref 0.1–1.0)
Monocytes Relative: 10 %
Neutro Abs: 2 10*3/uL (ref 1.7–7.7)
Neutrophils Relative %: 47 %
Platelets: 203 10*3/uL (ref 150–400)
RBC: 4.03 MIL/uL (ref 3.87–5.11)
RDW: 16.1 % — ABNORMAL HIGH (ref 11.5–15.5)
WBC: 4.2 10*3/uL (ref 4.0–10.5)
nRBC: 0 % (ref 0.0–0.2)

## 2022-05-09 LAB — GLUCOSE, CAPILLARY
Glucose-Capillary: 195 mg/dL — ABNORMAL HIGH (ref 70–99)
Glucose-Capillary: 240 mg/dL — ABNORMAL HIGH (ref 70–99)
Glucose-Capillary: 249 mg/dL — ABNORMAL HIGH (ref 70–99)
Glucose-Capillary: 303 mg/dL — ABNORMAL HIGH (ref 70–99)
Glucose-Capillary: 210 mg/dL — ABNORMAL HIGH (ref 70–99)

## 2022-05-09 MED ORDER — FLUTICASONE PROPIONATE 50 MCG/ACT NA SUSP
1.0000 | Freq: Every day | NASAL | Status: DC
  Administered 2022-05-09 – 2022-05-11 (×3): 1 via NASAL
  Filled 2022-05-09: qty 16

## 2022-05-09 MED ORDER — TRAMADOL HCL 50 MG PO TABS
50.0000 mg | ORAL_TABLET | Freq: Two times a day (BID) | ORAL | Status: DC | PRN
  Administered 2022-05-09 – 2022-05-10 (×2): 50 mg via ORAL
  Filled 2022-05-09 (×4): qty 1

## 2022-05-09 NOTE — Progress Notes (Signed)
Progress Note   Patient: Suzanne Snyder UJW:[id number] DOB: 1950-09-06 DOA: 05/06/2022     3 DOS: the patient was seen and examined on 05/09/2022     Subjective:  Patient seen and examined at bedside this morning Patient improving and looking more cheerful today than yesterday NG tube have been removed Diet being advanced by surgeon Denied nausea vomiting abdominal pain chest pain or cough   Brief hospital course: From HPI "Suzanne Snyder is a 72 y.o. female with medical history significant of type 1 diabetes, asthma, tobacco abuse, neuropathy, chronic pain presenting with small bowel obstruction.  Patient ports approximately 2 days of generalized abdominal pain, decreased p.o. intake, nausea and vomiting.  No fevers or chills.  No chest pain.  No reported diarrhea.  Emesis nonbloody nonbilious.  Patient reports that she may have had prior symptoms like this in the past.  Denies any prior abdominal surgeries- though is s/p cholecystectomy on imaging.  Symptoms progressively worsening.  No reported change in diet.  Has not been checking sugars at home.  No reported alcohol use.  Baseline 1+ pack per day smoker.  No reported alcohol use. Presented to the ER afebrile, hemodynamically stable.  White count 12.8, hemoglobin 14, platelets 360, creatinine 0.84, glucose 153.  Chest x-ray grossly stable.  CT chest abdomen pelvis with concern for small right mechanical bowel obstruction.  Also with 2.4 x 1 6 cm hypoattenuating focus in the right posterior liver with recommended for MRI imaging after resolution of symptoms.  Imaging also with short segment occlusion of the portal vein near the porta splenic confluence with some cavernous transformation in the porta hepatis.."       Assessment and Plan: * SBO (small bowel obstruction) Worsening abd pain x 2 days w/ noted small bowel obstruction on imaging  Also w/ ? Short segment portal vein occlusion on imaging.  Pain control  Antiemetics  General  surgery team on board we appreciate input NG tube removed now Advancement of diet according to surgical recommendation   Hypoglycemia-resolved Patient received several doses of D50 infusion   Portal vein obstruction Noted portal vein occlusion on imaging Suspect may be confounder to abdominal pain in the setting of small bowel obstruction Case preliminary discussed with Sheppard Plumber, NP with vascular surgery Chronicity relatively unclear Patient has been initiated on Eliquis I discussed the risks and benefits of being on Eliquis with the patient and she agreed Follow-up hypercoagulable panel Showing dilated and torturous vessels at the porta hepatis likely suggestive of collateral formation Vascular surgeon been consulted we appreciate input   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  HENT:     Head: Normocephalic and atraumatic.  Eyes:     Pupils: Pupils are equal, round, and reactive to light.  Cardiovascular:     Rate and Rhythm: Normal rate and regular rhythm.  Pulmonary:     Effort: Pulmonary effort is normal.  Abdominal:     General: Bowel sounds are normal.     Comments: + mild generalized abdominal tenderness Musculoskeletal:  General: Normal range of motion.  Skin:    General: Skin is dry.  Neurological:     General: Appears lethargic      Data Reviewed: I have reviewed patient's laboratory results showing sodium 142 potassium 3.8 glucose 185, WBC 3.9 and hemoglobin 11.9   Family Communication: I called the patient's daughter at the number listed however she was unable to answer at this time    Disposition: Status is: Inpatient Patient still continues to meet inpatient criteria given surgical management as well as NG tube requirement   Time spent: 50 minutes    Vitals:   05/08/22 1615 05/08/22 1915 05/09/22 0406 05/09/22 0729  BP: (!) 155/56 (!) 156/52 (!) 135/59 139/67  Pulse:  66 (!) 57 (!) 59  Resp:  18 16 18   Temp:  97.8 F (36.6 C) 97.7 F (36.5 C) 97.9 F (36.6 C)  TempSrc:  Oral    SpO2:  100% 100% 100%  Weight:      Height:        Author: Loyce Dys, MD 05/09/2022 2:58 PM  For on call review [email or web address].

## 2022-05-09 NOTE — Evaluation (Signed)
Occupational Therapy Evaluation Patient Details Name: Suzanne Snyder MRN: [id number] DOB: 1950/04/25 Today's Date: 05/09/2022   History of Present Illness Pt is a 72 y.o. female presenting to hospital 05/06/22 with c/o abdominal pain, N/V, and chest pain.  Pt admitted with SBO and portal vein obstruction.  PMH includes type I diabetic on insulin pump, anxiety, chronic pain, RLS, R cannulated hip pinning 02/09/22, L THR, L RCR, spine surgery, neuropathy.   Clinical Impression   Suzanne Snyder was seen for OT evaluation this date. Prior to hospital admission, pt was generally independent with ADL management. She endorses having limited supports at baseline and is concerned during our session regarding her recent conversation with APS. Pt states she has had second thoughts about her discussion with APS and would like to speak with a Child psychotherapist. SW notified via secure chat that pt would like to speak with them regarding her APS case . Pt lives alone in a 1 level home with all equipment necessary to perform ADL management at baseline level. She endorses being unsatisfied with her ability to perform ADL management efficiently since her recent DC from STR. Pt presents to acute OT demonstrating impaired ADL performance and functional mobility 2/2 generalized weakness, decreased activity tolerance and increased pain with mobility (See OT problem list for additional functional deficits). Pt currently requires SUPERVISION for safety with functional mobility, toileting, and exertional ADL management.  Pt would benefit from skilled OT services to address noted impairments and functional limitations (see below for any additional details) in order to maximize safety and independence while minimizing falls risk and caregiver burden.        Recommendations for follow up therapy are one component of a multi-disciplinary discharge planning process, led by the attending physician.  Recommendations may be updated based on  patient status, additional functional criteria and insurance authorization.   Assistance Recommended at Discharge Set up Supervision/Assistance  Patient can return home with the following A little help with walking and/or transfers;Assist for transportation;Help with stairs or ramp for entrance;Assistance with cooking/housework    Functional Status Assessment  Patient has had a recent decline in their functional status and demonstrates the ability to make significant improvements in function in a reasonable and predictable amount of time.  Equipment Recommendations  None recommended by OT    Recommendations for Other Services       Precautions / Restrictions Precautions Precautions: Fall Restrictions Weight Bearing Restrictions: Yes RLE Weight Bearing: Partial weight bearing Other Position/Activity Restrictions: Pt reports still PWB'ing from R hip surgery 02/09/22.      Mobility Bed Mobility Overal bed mobility: Modified Independent             General bed mobility comments: Supine to/from sitting without any noted difficulties    Transfers Overall transfer level: Needs assistance Equipment used: Rolling walker (2 wheels) Transfers: Sit to/from Stand Sit to Stand: Supervision     Step pivot transfers: Supervision            Balance Overall balance assessment: Needs assistance Sitting-balance support: No upper extremity supported, Feet supported Sitting balance-Leahy Scale: Normal Sitting balance - Comments: steady sitting reaching outside BOS   Standing balance support: Bilateral upper extremity supported, During functional activity, Reliant on assistive device for balance Standing balance-Leahy Scale: Good Standing balance comment: steady ambulating with RW use                           ADL either performed  or assessed with clinical judgement   ADL Overall ADL's : Needs assistance/impaired                                      Functional mobility during ADLs: Supervision/safety;Rolling walker (2 wheels);Cueing for safety General ADL Comments: Pt is functionally limited by generalized weakness, decreased activity tolerance, and decreased safety awareness. She requires SUPERVISION for safety with bed mobility, toilet transfer and toileting in room. Pt able to doff/don hospital socks independently at bed level.     Vision Baseline Vision/History: 1 Wears glasses Ability to See in Adequate Light: 1 Impaired Patient Visual Report: No change from baseline Additional Comments: Wears readers, does not have them.     Perception     Praxis      Pertinent Vitals/Pain Pain Assessment Pain Location: Low back Pain Descriptors / Indicators: Sore, Grimacing, Guarding Pain Intervention(s): Limited activity within patient's tolerance, Monitored during session, Repositioned, Utilized relaxation techniques, Premedicated before session     Hand Dominance Right   Extremity/Trunk Assessment Upper Extremity Assessment Upper Extremity Assessment: Overall WFL for tasks assessed   Lower Extremity Assessment Lower Extremity Assessment: Generalized weakness   Cervical / Trunk Assessment Cervical / Trunk Assessment: Normal   Communication Communication Communication: No difficulties   Cognition Arousal/Alertness: Awake/alert Behavior During Therapy: WFL for tasks assessed/performed Overall Cognitive Status: Within Functional Limits for tasks assessed                                       General Comments       Exercises Other Exercises Other Exercises: Pt educated on role of OT in acute setting vs. HH setting, falls prevention, safe use of AE/DME for ADL management and transfer techniques.   Shoulder Instructions      Home Living Family/patient expects to be discharged to:: Private residence Living Arrangements: Alone   Type of Home: House Home Access: Stairs to enter [employer] of  Steps: 4 steps with L railing from garage entrance; 6 steps with B railings (unable to reach both at same time) from front entrance Entrance Stairs-Rails: Right;Left;Can reach both Home Layout: One level     Bathroom Shower/Tub: Estate manager/land agent Accessibility: Yes   Home Equipment: Agricultural consultant (2 wheels);Rollator (4 wheels);Grab bars - tub/shower;Hand held shower head;BSC/3in1;Wheelchair - manual;Shower seat - built in;Grab bars - toilet   Additional Comments: bed rail; stool with rail (to get in/out of higher bed); 2 walking sticks      Prior Functioning/Environment Prior Level of Function : Independent/Modified Independent;Driving             Mobility Comments: Modified independent ambulating with 2 walking sticks.  No recent falls reported. ADLs Comments: Ind with self care and IADLs;        OT Problem List: Decreased strength;Decreased coordination;Decreased range of motion;Decreased activity tolerance;Decreased safety awareness;Decreased knowledge of use of DME or AE;Impaired balance (sitting and/or standing);Pain      OT Treatment/Interventions: Self-care/ADL training;Therapeutic exercise;Therapeutic activities;DME and/or AE instruction;Patient/family education;Balance training;Neuromuscular education    OT Goals(Current goals can be found in the care plan section) Acute Rehab OT Goals Patient Stated Goal: To go home OT Goal Formulation: With patient Time For Goal Achievement: 05/23/22 Potential to Achieve Goals: Good ADL Goals Pt Will Perform Grooming: sitting;with modified independence Pt  Will Perform Lower Body Dressing: sit to/from stand;with modified independence;with adaptive equipment Pt Will Transfer to Toilet: ambulating;bedside commode;with modified independence Pt Will Perform Toileting - Clothing Manipulation and hygiene: with modified independence;sit to/from stand;with adaptive equipment  OT Frequency: Min 1X/week    Co-evaluation               AM-PAC OT "6 Clicks" Daily Activity     Outcome Measure Help from another person eating meals?: None Help from another person taking care of personal grooming?: None Help from another person toileting, which includes using toliet, bedpan, or urinal?: A Little Help from another person bathing (including washing, rinsing, drying)?: A Little Help from another person to put on and taking off regular upper body clothing?: None Help from another person to put on and taking off regular lower body clothing?: A Little 6 Click Score: 21   End of Session Equipment Utilized During Treatment: Gait belt;Rolling walker (2 wheels)  Activity Tolerance: Patient tolerated treatment well Patient left: in bed;with call bell/phone within reach;with bed alarm set  OT Visit Diagnosis: Other abnormalities of gait and mobility (R26.89);Muscle weakness (generalized) (M62.81);Pain Pain - Right/Left:  (back)                Time: 1610-9604 OT Time Calculation (min): 41 min Charges:  OT General Charges $OT Visit: 1 Visit OT Evaluation $OT Eval Moderate Complexity: 1 Mod OT Treatments $Self Care/Home Management : 8-22 mins  Rockney Ghee, M.S., OTR/L 05/09/22, 12:38 PM

## 2022-05-09 NOTE — Progress Notes (Signed)
Patient ID: Suzanne Snyder, female   DOB: May 29, 1950, 72 y.o.   MRN: [id number]     SURGICAL PROGRESS NOTE   Hospital Day(s): 3.   Interval History: Patient seen and examined, no acute events or new complaints overnight. Patient reports feeling much better this morning.  She endorses continued having bowel movement and passing gas.  She denies any abdominal pain.  Her main complaint now is her chronic back pain.  Vital signs in last 24 hours: [min-max] current  Temp:  [97.7 F (36.5 C)-97.9 F (36.6 C)] 97.9 F (36.6 C) (04/27 0729) Pulse Rate:  [57-66] 59 (04/27 0729) Resp:  [16-18] 18 (04/27 0729) BP: (135-172)/(52-67) 139/67 (04/27 0729) SpO2:  [100 %] 100 % (04/27 0729)     Height: 5\' 8"  (172.7 cm) Weight: 49 kg     Physical Exam:  Constitutional: alert, cooperative and no distress  Respiratory: breathing non-labored at rest  Cardiovascular: regular rate and sinus rhythm  Gastrointestinal: soft, non-tender, and non-distended  Labs:     Latest Ref Rng & Units 05/09/2022    4:50 AM 05/08/2022    3:36 AM 05/07/2022    6:34 AM  CBC  WBC 4.0 - 10.5 K/uL 4.2  3.9  5.9   Hemoglobin 12.0 - 15.0 g/dL 56.3  87.5  64.3   Hematocrit 36.0 - 46.0 % 37.8  39.1  36.0   Platelets 150 - 400 K/uL 203  260  273       Latest Ref Rng & Units 05/09/2022    4:50 AM 05/08/2022    3:36 AM 05/07/2022    6:34 AM  CMP  Glucose 70 - 99 mg/dL 329  518  73   BUN 8 - 23 mg/dL 9  15  27    Creatinine 0.44 - 1.00 mg/dL 8.41  6.60  6.30   Sodium 135 - 145 mmol/L 135  142  136   Potassium 3.5 - 5.1 mmol/L 4.3  3.8  4.2   Chloride 98 - 111 mmol/L 107  111  108   CO2 22 - 32 mmol/L 19  23  24    Calcium 8.9 - 10.3 mg/dL 6.6  7.1  7.7   Total Protein 6.5 - 8.1 g/dL   5.8   Total Bilirubin 0.3 - 1.2 mg/dL   0.4   Alkaline Phos 38 - 126 U/L   70   AST 15 - 41 U/L   13   ALT 0 - 44 U/L   12     Imaging studies: No new pertinent imaging studies   Assessment/Plan:  72 y.o. female with small bowel  obstruction, complicated by pertinent comorbidities including type 1 diabetes.  -Clinically continue with resolving SBO.  She is having bowel movement and tolerated clear liquid diet. -Will advance diet to full liquids -Encourage patient to ambulate -Continue conservative medical management of comorbidities as per primary team -Will continue to follow along  Gae Gallop, MD

## 2022-05-09 NOTE — Plan of Care (Signed)
  Problem: Coping: Goal: Ability to adjust to condition or change in health will improve Outcome: Progressing   Problem: Nutritional: Goal: Maintenance of adequate nutrition will improve Outcome: Progressing   Problem: Elimination: Goal: Will not experience complications related to bowel motility Outcome: Progressing   Problem: Pain Managment: Goal: General experience of comfort will improve Outcome: Progressing

## 2022-05-10 DIAGNOSIS — K56609 Unspecified intestinal obstruction, unspecified as to partial versus complete obstruction: Secondary | ICD-10-CM | POA: Diagnosis not present

## 2022-05-10 LAB — BASIC METABOLIC PANEL
Anion gap: 6 (ref 5–15)
BUN: 10 mg/dL (ref 8–23)
CO2: 21 mmol/L — ABNORMAL LOW (ref 22–32)
Calcium: 6.9 mg/dL — ABNORMAL LOW (ref 8.9–10.3)
Chloride: 107 mmol/L (ref 98–111)
Creatinine, Ser: 0.54 mg/dL (ref 0.44–1.00)
GFR, Estimated: >60 mL/min (ref >60)
Glucose, Bld: 250 mg/dL — ABNORMAL HIGH (ref 70–99)
Potassium: 4.2 mmol/L (ref 3.5–5.1)
Sodium: 134 mmol/L — ABNORMAL LOW (ref 135–145)

## 2022-05-10 LAB — GLUCOSE, CAPILLARY
Glucose-Capillary: 129 mg/dL — ABNORMAL HIGH (ref 70–99)
Glucose-Capillary: 183 mg/dL — ABNORMAL HIGH (ref 70–99)
Glucose-Capillary: 205 mg/dL — ABNORMAL HIGH (ref 70–99)
Glucose-Capillary: 278 mg/dL — ABNORMAL HIGH (ref 70–99)
Glucose-Capillary: 32 mg/dL — CL (ref 70–99)
Glucose-Capillary: 375 mg/dL — ABNORMAL HIGH (ref 70–99)
Glucose-Capillary: 424 mg/dL — ABNORMAL HIGH (ref 70–99)

## 2022-05-10 LAB — CBC WITH DIFFERENTIAL/PLATELET
Abs Immature Granulocytes: 0.03 10*3/uL (ref 0.00–0.07)
Basophils Absolute: 0 10*3/uL (ref 0.0–0.1)
Basophils Relative: 1 %
Eosinophils Absolute: 0.2 10*3/uL (ref 0.0–0.5)
Eosinophils Relative: 4 %
HCT: 35.2 % — ABNORMAL LOW (ref 36.0–46.0)
Hemoglobin: 11.4 g/dL — ABNORMAL LOW (ref 12.0–15.0)
Immature Granulocytes: 1 %
Lymphocytes Relative: 38 %
Lymphs Abs: 2 10*3/uL (ref 0.7–4.0)
MCH: 29.2 pg (ref 26.0–34.0)
MCHC: 32.4 g/dL (ref 30.0–36.0)
MCV: 90 fL (ref 80.0–100.0)
Monocytes Absolute: 0.4 10*3/uL (ref 0.1–1.0)
Monocytes Relative: 8 %
Neutro Abs: 2.6 10*3/uL (ref 1.7–7.7)
Neutrophils Relative %: 48 %
Platelets: 245 10*3/uL (ref 150–400)
RBC: 3.91 MIL/uL (ref 3.87–5.11)
RDW: 15.9 % — ABNORMAL HIGH (ref 11.5–15.5)
WBC: 5.3 10*3/uL (ref 4.0–10.5)
nRBC: 0 % (ref 0.0–0.2)

## 2022-05-10 MED ORDER — DEXTROSE 50 % IV SOLN: 12.5000 g | INTRAVENOUS | Status: DC | PRN

## 2022-05-10 MED ORDER — INSULIN DETEMIR 100 UNIT/ML ~~LOC~~ SOLN
6.0000 [IU] | Freq: Once | SUBCUTANEOUS | Status: AC
  Administered 2022-05-10: 6 [IU] via SUBCUTANEOUS
  Filled 2022-05-10 (×2): qty 0.06

## 2022-05-10 MED ORDER — DEXTROSE 50 % IV SOLN: 25.0000 g | INTRAVENOUS | Status: DC | PRN

## 2022-05-10 MED ORDER — INSULIN ASPART 100 UNIT/ML IJ SOLN
10.0000 [IU] | Freq: Once | INTRAMUSCULAR | Status: AC
  Administered 2022-05-10: 10 [IU] via SUBCUTANEOUS
  Filled 2022-05-10: qty 1

## 2022-05-10 MED ORDER — TRAMADOL HCL 50 MG PO TABS
50.0000 mg | ORAL_TABLET | Freq: Two times a day (BID) | ORAL | Status: DC | PRN
  Administered 2022-05-10 – 2022-05-11 (×2): 50 mg via ORAL
  Filled 2022-05-10 (×2): qty 1

## 2022-05-10 NOTE — Plan of Care (Signed)
  Problem: Nutritional: Goal: Maintenance of adequate nutrition will improve Outcome: Progressing   Problem: Activity: Goal: Risk for activity intolerance will decrease Outcome: Progressing   Problem: Elimination: Goal: Will not experience complications related to bowel motility Outcome: Progressing   Problem: Pain Managment: Goal: General experience of comfort will improve Outcome: Progressing   Problem: Skin Integrity: Goal: Risk for impaired skin integrity will decrease Outcome: Progressing

## 2022-05-10 NOTE — Progress Notes (Signed)
Physical Therapy Treatment Patient Details Name: Suzanne Snyder MRN: [id number] DOB: 1950-09-11 Today's Date: 05/10/2022   History of Present Illness Pt is a 72 y.o. female presenting to hospital 05/06/22 with c/o abdominal pain, N/V, and chest pain.  Pt admitted with SBO and portal vein obstruction.  PMH includes type I diabetic on insulin pump, anxiety, chronic pain, RLS, R cannulated hip pinning 02/09/22, L THR, L RCR, spine surgery, neuropathy.    PT Comments    OOB and completes x 1 lap on unit with RW and min guard/supervision.  Bed mobility with ease.  Participated in exercises as described below.  Overall progressing well with mobility.   Recommendations for follow up therapy are one component of a multi-disciplinary discharge planning process, led by the attending physician.  Recommendations may be updated based on patient status, additional functional criteria and insurance authorization.  Follow Up Recommendations       Assistance Recommended at Discharge PRN  Patient can return home with the following Help with stairs or ramp for entrance;Assist for transportation;A little help with walking and/or transfers;A little help with bathing/dressing/bathroom   Equipment Recommendations       Recommendations for Other Services       Precautions / Restrictions Precautions Precautions: Fall Restrictions Weight Bearing Restrictions: Yes RLE Weight Bearing: Partial weight bearing Other Position/Activity Restrictions: Pt reports still PWB'ing from R hip surgery 02/09/22.     Mobility  Bed Mobility Overal bed mobility: Modified Independent               Patient Response: Cooperative  Transfers Overall transfer level: Needs assistance Equipment used: Rolling walker (2 wheels) Transfers: Sit to/from Stand Sit to Stand: Supervision                Ambulation/Gait Ambulation/Gait assistance: Min guard, Supervision Gait Distance (Feet): 160 Feet Assistive device:  Rolling walker (2 wheels) Gait Pattern/deviations: Step-through pattern, Decreased step length - right, Decreased step length - left Gait velocity: decreased         Stairs             Wheelchair Mobility    Modified Rankin (Stroke Patients Only)       Balance Overall balance assessment: Needs assistance Sitting-balance support: No upper extremity supported, Feet supported Sitting balance-Leahy Scale: Normal Sitting balance - Comments: steady sitting reaching outside BOS   Standing balance support: Bilateral upper extremity supported, During functional activity, Reliant on assistive device for balance Standing balance-Leahy Scale: Good Standing balance comment: steady ambulating with RW use                            Cognition Arousal/Alertness: Awake/alert Behavior During Therapy: WFL for tasks assessed/performed Overall Cognitive Status: Within Functional Limits for tasks assessed                                          Exercises Other Exercises Other Exercises: minimal supine AROM x 10 - "that's enough" after ankle pumps, SLR and a few heel slides    General Comments        Pertinent Vitals/Pain Pain Assessment Pain Assessment: No/denies pain    Home Living                          Prior Function  PT Goals (current goals can now be found in the care plan section) Progress towards PT goals: Progressing toward goals    Frequency    Min 2X/week      PT Plan Current plan remains appropriate    Co-evaluation              AM-PAC PT "6 Clicks" Mobility   Outcome Measure  Help needed turning from your back to your side while in a flat bed without using bedrails?: None Help needed moving from lying on your back to sitting on the side of a flat bed without using bedrails?: None Help needed moving to and from a bed to a chair (including a wheelchair)?: A Little Help needed standing up from  a chair using your arms (e.g., wheelchair or bedside chair)?: A Little Help needed to walk in hospital room?: A Little Help needed climbing 3-5 steps with a railing? : A Little 6 Click Score: 20    End of Session Equipment Utilized During Treatment: Gait belt Activity Tolerance: Patient tolerated treatment well Patient left: in bed;with call bell/phone within reach;with bed alarm set;with family/visitor present Nurse Communication: Mobility status;Precautions PT Visit Diagnosis: Other abnormalities of gait and mobility (R26.89);Muscle weakness (generalized) (M62.81);History of falling (Z91.81);Pain Pain - Right/Left: Right     Time: 0981-1914 PT Time Calculation (min) (ACUTE ONLY): 11 min  Charges:  $Gait Training: 8-22 mins                   Danielle Dess, PTA 05/10/22, 2:40 PM

## 2022-05-10 NOTE — Progress Notes (Signed)
       CROSS COVER NOTE  NAME: Suzanne Snyder MRN: [id number] DOB : 11-Feb-1950 ATTENDING PHYSICIAN: Loyce Dys, MD    Date of Service   05/10/2022   HPI/Events of Note   Report/Request Notified of elevated CBG--> 424. CBG 375 at 1705 and given 9U of novolog at that time. Diet advanced to soft today from full liquid.  On Review of chart M(r)s Suzanne Snyder is a Type I Diabetic and requires basal insulin. She is not currently wearing her insulin pump.  4/25 Diabetes coordinator note reviewed. Patient receiving ~13U of basal insulin in 24 hrs. Will start 1/2 dose basal insulin 2/2 recent hypoglycemia.  Update 2340: CBG 32--> Treat per hypoglycemia protocol  Interventions   Assessment/Plan:  Type I Diabetes w/ Severe Hypoglycemia 6U Levemir x1 10U Novolog x1 PRN D50         To reach the provider On-Call:   7AM- 7PM see care teams to locate the attending and reach out to them via [email or web address] Password: [email or web address] 7PM-7AM contact night-coverage If you still have difficulty reaching the appropriate provider, please page the [facility] (Director on Call) for [facility] on amion for assistance  This document was prepared using Conservation officer, historic buildings and may include unintentional dictation errors.  Bishop Limbo DNP, MBA, FNP-BC, PMHNP-BC Nurse Practitioner [facility] Pager [phone]

## 2022-05-10 NOTE — Progress Notes (Signed)
Patient ID: Suzanne Snyder, female   DOB: 03-20-1950, 72 y.o.   MRN: [id number]     SURGICAL PROGRESS NOTE   Hospital Day(s): 4.   Interval History: Patient seen and examined, no acute events or new complaints overnight. Patient reports feeling okay.  She denies any pain with the full liquids yesterday.  She continued to pass gas.  Vital signs in last 24 hours: [min-max] current  Temp:  [98.2 F (36.8 C)-98.6 F (37 C)] 98.4 F (36.9 C) (04/28 0744) Pulse Rate:  [59-62] 59 (04/28 0744) Resp:  [10-18] 10 (04/28 0744) BP: (130-143)/(50-65) 141/58 (04/28 0744) SpO2:  [98 %-100 %] 100 % (04/28 0744)     Height: 5\' 8"  (172.7 cm) Weight: 49 kg     Physical Exam:  Constitutional: alert, cooperative and no distress  Respiratory: breathing non-labored at rest  Cardiovascular: regular rate and sinus rhythm  Gastrointestinal: soft, non-tender, and non-distended  Labs:     Latest Ref Rng & Units 05/10/2022    5:12 AM 05/09/2022    4:50 AM 05/08/2022    3:36 AM  CBC  WBC 4.0 - 10.5 K/uL 5.3  4.2  3.9   Hemoglobin 12.0 - 15.0 g/dL 21.3  08.6  57.8   Hematocrit 36.0 - 46.0 % 35.2  37.8  39.1   Platelets 150 - 400 K/uL 245  203  260       Latest Ref Rng & Units 05/10/2022    5:12 AM 05/09/2022    4:50 AM 05/08/2022    3:36 AM  CMP  Glucose 70 - 99 mg/dL 469  629  528   BUN 8 - 23 mg/dL 10  9  15    Creatinine 0.44 - 1.00 mg/dL 4.13  2.44  0.10   Sodium 135 - 145 mmol/L 134  135  142   Potassium 3.5 - 5.1 mmol/L 4.2  4.3  3.8   Chloride 98 - 111 mmol/L 107  107  111   CO2 22 - 32 mmol/L 21  19  23    Calcium 8.9 - 10.3 mg/dL 6.9  6.6  7.1     Imaging studies: No new pertinent imaging studies   Assessment/Plan:  71 y.o. female with small bowel obstruction, complicated by pertinent comorbidities including type 1 diabetes.   -Patient continue with resolving SBO.  She tolerated for liquid diet.  She continues passing gas. -Will advance diet to soft diet.  Assess for  toleration. -Continue optimizing blood glucose levels -Encouraged the patient to mobilize -Will continue to follow  Gae Gallop, MD

## 2022-05-10 NOTE — Progress Notes (Signed)
Progress Note   Patient: Suzanne Snyder ZOX:[id number] DOB: [birth year] DOA: 05/06/2022     4 DOS: the patient was seen and examined on 05/10/2022     Subjective:  Patient seen and examined at bedside this morning Admits to improvement in abdominal pain Diet being advanced by surgeon Denied nausea vomiting, chest pain or cough   Brief hospital course: From HPI "Suzanne Snyder is a 72 y.o. female with medical history significant of type 1 diabetes, asthma, tobacco abuse, neuropathy, chronic pain presenting with small bowel obstruction.  Patient ports approximately 2 days of generalized abdominal pain, decreased p.o. intake, nausea and vomiting.  No fevers or chills.  No chest pain.  No reported diarrhea.  Emesis nonbloody nonbilious.  Patient reports that she may have had prior symptoms like this in the past.  Denies any prior abdominal surgeries- though is s/p cholecystectomy on imaging.  Symptoms progressively worsening.  No reported change in diet.  Has not been checking sugars at home.  No reported alcohol use.  Baseline 1+ pack per day smoker.  No reported alcohol use. Presented to the ER afebrile, hemodynamically stable.  White count 12.8, hemoglobin 14, platelets 360, creatinine 0.84, glucose 153.  Chest x-ray grossly stable.  CT chest abdomen pelvis with concern for small right mechanical bowel obstruction.  Also with 2.4 x 1 6 cm hypoattenuating focus in the right posterior liver with recommended for MRI imaging after resolution of symptoms.  Imaging also with short segment occlusion of the portal vein near the porta splenic confluence with some cavernous transformation in the porta hepatis.."       Assessment and Plan: * SBO (small bowel obstruction) Worsening abd pain x 2 days w/ noted small bowel obstruction on imaging  Also w/ ? Short segment portal vein occlusion on imaging.  Pain medication switched from Dilaudid to tramadol Continue antiemetics  General surgery team on board  we appreciate input Advancement of diet according to surgical recommendation I discussed the case with general surgeon at bedside on rounds this morning  Hypoglycemia-resolved Patient received several doses of D50 infusion   Portal vein obstruction Noted portal vein occlusion on imaging Suspect may be confounder to abdominal pain in the setting of small bowel obstruction Case preliminary discussed with Sheppard Plumber, NP with vascular surgery Chronicity relatively unclear Patient has been initiated on Eliquis I discussed the risks and benefits of being on Eliquis with the patient and she agreed Follow-up hypercoagulable panel Showing dilated and torturous vessels at the porta hepatis likely suggestive of collateral formation Vascular surgeon been consulted we appreciate input   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  HENT:     Head: Normocephalic and atraumatic.  Eyes:     Pupils: Pupils are equal, round, and reactive to light.  Cardiovascular:     Rate and Rhythm: Normal rate and regular rhythm.  Pulmonary:     Effort: Pulmonary effort is normal.  Abdominal:     General: Bowel sounds are normal.     Comments: Nontender Musculoskeletal:  General: Normal range of motion.  Skin:    General: Skin is dry.  Neurological:     General: Appears lethargic      Data Reviewed: I have reviewed patient's laboratory results showing sodium sodium 134 potassium 4.2 creatinine 0.54 WBC 5.3 hemoglobin 11.4 Family Communication: I called the patient's daughter at the number listed however she was unable to answer at this  time   Disposition: Status is: Inpatient Patient still continues to meet inpatient criteria given surgical management with advancement of diet   Time spent: 40 minutes        Vitals:   05/09/22 1935 05/10/22 0421 05/10/22 0744 05/10/22 0943  BP: (!) 130/50 (!) 137/59 (!) 141/58   Pulse: 61 (!) 59 (!) 59   Resp: 16 18 10 16   Temp: 98.2 F (36.8 C) 98.5 F (36.9 C) 98.4 F (36.9 C)   TempSrc: Oral Oral    SpO2: 98% 100% 100%   Weight:      Height:        Author: Loyce Dys, MD 05/10/2022 4:49 PM  For on call review [email or web address].

## 2022-05-11 DIAGNOSIS — K56609 Unspecified intestinal obstruction, unspecified as to partial versus complete obstruction: Secondary | ICD-10-CM | POA: Diagnosis not present

## 2022-05-11 LAB — CBC WITH DIFFERENTIAL/PLATELET
Abs Immature Granulocytes: 0.04 10*3/uL (ref 0.00–0.07)
Basophils Absolute: 0 10*3/uL (ref 0.0–0.1)
Basophils Relative: 1 %
Eosinophils Absolute: 0.2 10*3/uL (ref 0.0–0.5)
Eosinophils Relative: 4 %
HCT: 36.9 % (ref 36.0–46.0)
Hemoglobin: 11.9 g/dL — ABNORMAL LOW (ref 12.0–15.0)
Immature Granulocytes: 1 %
Lymphocytes Relative: 37 %
Lymphs Abs: 2.2 10*3/uL (ref 0.7–4.0)
MCH: 28.8 pg (ref 26.0–34.0)
MCHC: 32.2 g/dL (ref 30.0–36.0)
MCV: 89.3 fL (ref 80.0–100.0)
Monocytes Absolute: 0.5 10*3/uL (ref 0.1–1.0)
Monocytes Relative: 9 %
Neutro Abs: 2.9 10*3/uL (ref 1.7–7.7)
Neutrophils Relative %: 48 %
Platelets: 246 10*3/uL (ref 150–400)
RBC: 4.13 MIL/uL (ref 3.87–5.11)
RDW: 15.9 % — ABNORMAL HIGH (ref 11.5–15.5)
WBC: 6 10*3/uL (ref 4.0–10.5)
nRBC: 0 % (ref 0.0–0.2)

## 2022-05-11 LAB — GLUCOSE, CAPILLARY
Glucose-Capillary: 151 mg/dL — ABNORMAL HIGH (ref 70–99)
Glucose-Capillary: 172 mg/dL — ABNORMAL HIGH (ref 70–99)
Glucose-Capillary: 265 mg/dL — ABNORMAL HIGH (ref 70–99)
Glucose-Capillary: 97 mg/dL (ref 70–99)

## 2022-05-11 LAB — BASIC METABOLIC PANEL
Anion gap: 5 (ref 5–15)
BUN: 9 mg/dL (ref 8–23)
CO2: 25 mmol/L (ref 22–32)
Calcium: 7.6 mg/dL — ABNORMAL LOW (ref 8.9–10.3)
Chloride: 104 mmol/L (ref 98–111)
Creatinine, Ser: 0.44 mg/dL (ref 0.44–1.00)
GFR, Estimated: >60 mL/min (ref >60)
Glucose, Bld: 166 mg/dL — ABNORMAL HIGH (ref 70–99)
Potassium: 4 mmol/L (ref 3.5–5.1)
Sodium: 134 mmol/L — ABNORMAL LOW (ref 135–145)

## 2022-05-11 MED ORDER — APIXABAN 5 MG PO TABS: 10.0000 mg | ORAL_TABLET | Freq: Two times a day (BID) | ORAL | 0 refills | Status: DC

## 2022-05-11 MED ORDER — DEXTROSE 50 % IV SOLN: 25.0000 g | INTRAVENOUS | Status: DC | PRN

## 2022-05-11 MED ORDER — PANTOPRAZOLE SODIUM 40 MG PO TBEC
40.0000 mg | DELAYED_RELEASE_TABLET | Freq: Two times a day (BID) | ORAL | Status: DC
  Administered 2022-05-11: 40 mg via ORAL
  Filled 2022-05-11: qty 1

## 2022-05-11 MED ORDER — DEXTROSE 50 % IV SOLN: 12.5000 g | INTRAVENOUS | Status: DC | PRN

## 2022-05-11 MED ORDER — APIXABAN 5 MG PO TABS: 5.0000 mg | ORAL_TABLET | Freq: Two times a day (BID) | ORAL | 0 refills | Status: DC

## 2022-05-11 NOTE — Progress Notes (Signed)
[facility] SURGICAL PROGRESS NOTE (cpt [phone])  Hospital Day(s): 5.   Interval History: Patient seen and examined. No acute events overnight. Patient reports she is feeling much better. No fever, chills, nausea, emesis. Labs are reassuring this AM. No electrolyte derangements. No new imaging this AM. She is on soft diet; tolerating. Continues to have bowel function.   Review of Systems:  Constitutional: denies fever, chills  HEENT: denies cough or congestion  Respiratory: denies any shortness of breath  Cardiovascular: denies chest pain or palpitations  Gastrointestinal: denied abdominal pain, denied N/V Genitourinary: denies burning with urination or urinary frequency Musculoskeletal: denies pain, decreased motor or sensation  Vital signs in last 24 hours: [min-max] current  Temp:  [97.8 F (36.6 C)-98.4 F (36.9 C)] 97.8 F (36.6 C) (04/29 0339) Pulse Rate:  [58-61] 61 (04/29 0339) Resp:  [10-18] 18 (04/29 0339) BP: (136-150)/(58-63) 136/61 (04/29 0339) SpO2:  [99 %-100 %] 99 % (04/29 0339)     Height: 5\' 8"  (172.7 cm) Weight: 49 kg     Intake/Output last 2 shifts:  04/28 0701 - 04/29 0700 In: 358 [P.O.:358] Out: -    Physical Exam:  Constitutional: somnolent, arouses to verbal stimuli, NAD HENT: normocephalic without obvious abnormality  Eyes: PERRL, EOM's grossly intact and symmetric  Respiratory: breathing non-labored at rest  Cardiovascular: regular rate and sinus rhythm  Gastrointestinal: soft, non-tender, and non-distended, no rebound/guarding. She is certainly without peritonitis Musculoskeletal: no edema or wounds, motor and sensation grossly intact, NT    Labs:     Latest Ref Rng & Units 05/11/2022    5:02 AM 05/10/2022    5:12 AM 05/09/2022    4:50 AM  CBC  WBC 4.0 - 10.5 K/uL 6.0  5.3  4.2   Hemoglobin 12.0 - 15.0 g/dL 60.4  54.0  98.1   Hematocrit 36.0 - 46.0 % 36.9  35.2  37.8   Platelets 150 - 400 K/uL 246  245  203       Latest Ref  Rng & Units 05/11/2022    5:02 AM 05/10/2022    5:12 AM 05/09/2022    4:50 AM  CMP  Glucose 70 - 99 mg/dL 191  478  295   BUN 8 - 23 mg/dL 9  10  9    Creatinine 0.44 - 1.00 mg/dL 6.21  3.08  6.57   Sodium 135 - 145 mmol/L 134  134  135   Potassium 3.5 - 5.1 mmol/L 4.0  4.2  4.3   Chloride 98 - 111 mmol/L 104  107  107   CO2 22 - 32 mmol/L 25  21  19    Calcium 8.9 - 10.3 mg/dL 7.6  6.9  6.6      Imaging studies:  No new imaging studies    Assessment/Plan: (ICD-10's: K73.609) 72 y.o. female with clinically resolved SBO   - Continue soft/regular diet  - No emergent surgical intervention; will follow progress - Monitor abdominal examination; on-going bowel function - Pain control prn; antiemetics prn              - Mobilize as tolerated   - Appreciate vascular recommendations              - Further management per primary service   - Discharge Planning: SBO resolved; nothing further from surgical perspective. Discharge once medically clear   All of the above findings and recommendations were discussed with the patient, and all of patient's questions were answered to her expressed  satisfaction.  -- Lynden Oxford, PA-C [facility] 05/11/2022, 7:28 AM M-F: 7am - 4pm

## 2022-05-11 NOTE — Progress Notes (Signed)
Physical Therapy Treatment Patient Details Name: Suzanne Snyder MRN: [id number] DOB: [birth year] Today's Date: 05/11/2022   History of Present Illness Pt is a 72 y.o. female presenting to hospital 05/06/22 with c/o abdominal pain, N/V, and chest pain.  Pt admitted with SBO and portal vein obstruction.  PMH includes type I diabetic on insulin pump, anxiety, chronic pain, RLS, R cannulated hip pinning 02/09/22, L THR, L RCR, spine surgery, neuropathy.    PT Comments    Pt ready for session.  Stated she is going home today.  Completes x 2 laps on unit with RW and min guard/supervision.  Continues to demonstrate decreased safety at times and balance deficits.  She does better with BUE support on RW.  She feels comfortable with mobility and has no further questions or concerns regarding mobility at discharge.   Recommendations for follow up therapy are one component of a multi-disciplinary discharge planning process, led by the attending physician.  Recommendations may be updated based on patient status, additional functional criteria and insurance authorization.  Follow Up Recommendations       Assistance Recommended at Discharge PRN  Patient can return home with the following Help with stairs or ramp for entrance;Assist for transportation;A little help with walking and/or transfers;A little help with bathing/dressing/bathroom   Equipment Recommendations       Recommendations for Other Services       Precautions / Restrictions Precautions Precautions: Fall Restrictions Weight Bearing Restrictions: Yes RLE Weight Bearing: Partial weight bearing Other Position/Activity Restrictions: Pt reports still PWB'ing from R hip surgery 02/09/22.     Mobility  Bed Mobility Overal bed mobility: Modified Independent                  Transfers Overall transfer level: Needs assistance Equipment used: Rolling walker (2 wheels) Transfers: Sit to/from Stand Sit to Stand: Supervision                 Ambulation/Gait Ambulation/Gait assistance: Min guard, Supervision Gait Distance (Feet): 320 Feet Assistive device: Rolling walker (2 wheels)   Gait velocity: decreased     General Gait Details: step thorugh pattern, leaves walker to the side upon entering room and uses counter/bed for support to get back to bed.  seems comfortable taking a few steps in the room without UE support.   Stairs             Wheelchair Mobility    Modified Rankin (Stroke Patients Only)       Balance Overall balance assessment: Needs assistance Sitting-balance support: No upper extremity supported, Feet supported Sitting balance-Leahy Scale: Normal     Standing balance support: No upper extremity supported Standing balance-Leahy Scale: Fair Standing balance comment: improved with BUE support                            Cognition Arousal/Alertness: Awake/alert Behavior During Therapy: WFL for tasks assessed/performed Overall Cognitive Status: Within Functional Limits for tasks assessed                                          Exercises      General Comments        Pertinent Vitals/Pain Pain Assessment Pain Assessment: No/denies pain    Home Living  Prior Function            PT Goals (current goals can now be found in the care plan section) Progress towards PT goals: Progressing toward goals    Frequency    Min 2X/week      PT Plan Current plan remains appropriate    Co-evaluation              AM-PAC PT "6 Clicks" Mobility   Outcome Measure  Help needed turning from your back to your side while in a flat bed without using bedrails?: None Help needed moving from lying on your back to sitting on the side of a flat bed without using bedrails?: None Help needed moving to and from a bed to a chair (including a wheelchair)?: A Little Help needed standing up from a chair using your  arms (e.g., wheelchair or bedside chair)?: A Little Help needed to walk in hospital room?: A Little Help needed climbing 3-5 steps with a railing? : A Little 6 Click Score: 20    End of Session Equipment Utilized During Treatment: Gait belt Activity Tolerance: Patient tolerated treatment well Patient left: in bed;with call bell/phone within reach;with bed alarm set;with family/visitor present Nurse Communication: Mobility status;Precautions PT Visit Diagnosis: Other abnormalities of gait and mobility (R26.89);Muscle weakness (generalized) (M62.81);History of falling (Z91.81);Pain Pain - Right/Left: Right     Time: 1610-9604 PT Time Calculation (min) (ACUTE ONLY): 8 min  Charges:  $Gait Training: 8-22 mins                   Danielle Dess, PTA 05/11/22, 11:38 AM

## 2022-05-11 NOTE — Discharge Summary (Signed)
Physician Discharge Summary   Patient: Suzanne Snyder MRN: [id number] DOB: 20-May-1950  Admit date:     05/06/2022  Discharge date: 05/11/22  Discharge Physician: Loyce Dys   PCP: Gracelyn Nurse, MD     Discharge Diagnoses: SBO (small bowel obstruction) Hypoglycemia-resolved Portal vein obstruction 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 Severe hypoglycemia s/p multiple IV dextrose infusion  tobacco abuse  Hospital Course: Suzanne Snyder is a 72 y.o. female with medical history significant of type 1 diabetes, asthma, tobacco abuse, neuropathy, chronic pain presenting with small bowel obstruction.  Patient reports approximately 2 days of generalized abdominal pain, decreased p.o. intake, nausea and vomiting.  No fevers or chills.  No chest pain.  No reported diarrhea.  Emesis nonbloody nonbilious.   Presented to the ER afebrile, hemodynamically stable.  White count 12.8, hemoglobin 14, platelets 360, creatinine 0.84, glucose 153.  Chest x-ray grossly stable.  CT chest abdomen pelvis with concern for small right mechanical bowel obstruction.  Also with 2.4 x 1. 6 cm hypoattenuating focus in the right posterior liver with recommended for MRI imaging after resolution of symptoms.  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  CT scan also showed findings of 10 x 6 mm nodules in the anterior right lower lobe.  I have discussed all of these findings with patient and recommended for her to have outpatient MRI of the abdomen in about 2 weeks time when she has fully recovered from her current symptoms to further evaluate her liver findings as well as CT scan of the chest in 3 to 6 months time. Patient expressed understanding and willingness to follow-up with primary care physician.  Has been cleared by general surgeon for discharge today.  Vascular surgeon also  evaluated patient concerning hepatic vein findings and currently patient is on anticoagulant.   Consultants: Surgery Procedures performed: None Disposition: Home health Diet recommendation:  Discharge Diet Orders (From admission, onward)     Start     Ordered   05/11/22 0000  Diet - low sodium heart healthy        05/11/22 1248           Cardiac diet DISCHARGE MEDICATION: Allergies as of 05/11/2022       Reactions   Cat Hair Extract Shortness Of Breath   Pregabalin Anaphylaxis   Erythromycin Other (See Comments)   Severe stomach upset        Medication List     STOP taking these medications    BENADRYL ALLERGY PO       TAKE these medications    acetaminophen 325 MG tablet Commonly known as: TYLENOL Take 2 tablets (650 mg total) by mouth every 6 (six) hours as needed for mild pain, moderate pain, fever or headache (or Fever >/= 101).   albuterol 108 (90 Base) MCG/ACT inhaler Commonly known as: VENTOLIN HFA Inhale into the lungs every 6 (six) hours as needed for wheezing or shortness of breath.   apixaban 5 MG Tabs tablet Commonly known as: ELIQUIS Take 2 tablets (10 mg total) by mouth 2 (two) times daily for 4 days.   apixaban 5 MG Tabs tablet Commonly known as: Eliquis Take 1 tablet (5 mg total) by mouth 2 (two) times daily. Start taking on: May 15, 2022   bisacodyl 5 MG EC tablet Commonly known as: DULCOLAX Take 2 tablets (10 mg total) by mouth  daily as needed for moderate constipation.   bisacodyl 10 MG suppository Commonly known as: DULCOLAX Place 1 suppository (10 mg total) rectally daily as needed for severe constipation.   clonazePAM 0.5 MG tablet Commonly known as: KLONOPIN Take 1 tablet (0.5 mg total) by mouth 2 (two) times daily as needed for anxiety.   cyanocobalamin 1000 MCG tablet Take 1 tablet (1,000 mcg total) by mouth daily.   cyclobenzaprine 10 MG tablet Commonly known as: FLEXERIL Take 10 mg by mouth 3 (three) times daily as  needed for muscle spasms.   Fish Oil 1000 MG Caps Take 1.5 capsules by mouth 2 (two) times daily.   furosemide 20 MG tablet Commonly known as: LASIX Take 20 mg by mouth daily as needed for edema or fluid.   gabapentin 300 MG capsule Commonly known as: NEURONTIN Take 300 mg by mouth 4 (four) times daily.   hydrOXYzine 10 MG tablet Commonly known as: ATARAX TAKE 1 OR 2 TABLET BY MOUTH THREE TIMES DAILY AS NEEDED FOR ITCH   hydrOXYzine 25 MG tablet Commonly known as: ATARAX Take 25 mg by mouth 4 (four) times daily.   insulin aspart 100 UNIT/ML injection Commonly known as: novoLOG Inject into the skin 4 (four) times daily as needed for high blood sugar (with meals and at bedtime). Sliding scale What changed: Another medication with the same name was removed. Continue taking this medication, and follow the directions you see here.   ipratropium 0.02 % nebulizer solution Commonly known as: ATROVENT Take 0.5 mg by nebulization every 6 (six) hours as needed for wheezing or shortness of breath.   iron polysaccharides 150 MG capsule Commonly known as: NIFEREX Take 1 capsule (150 mg total) by mouth daily.   MULTIVITAMIN ADULT PO Take by mouth 2 (two) times daily.   pantoprazole 40 MG tablet Commonly known as: Protonix Take 1 tablet (40 mg total) by mouth daily.   polyethylene glycol 17 g packet Commonly known as: MiraLax Take 17 g by mouth daily.   polyvinyl alcohol 1.4 % ophthalmic solution Commonly known as: LIQUIFILM TEARS Place 1 drop into both eyes as needed for dry eyes.   Probiotic 250 MG Caps Take 1 capsule by mouth 3 times/day as needed-between meals & bedtime.   rOPINIRole 1 MG tablet Commonly known as: REQUIP Take 1.5 mg by mouth at bedtime.   traMADol 50 MG tablet Commonly known as: ULTRAM Take 1-2 tablets (50-100 mg total) by mouth 2 (two) times daily as needed.   Vitamin D (Ergocalciferol) 1.25 MG (50000 UNIT) Caps capsule Commonly known as:  DRISDOL Take 50,000 Units by mouth 2 (two) times a week.        Follow-up Information     Schedule an appointment as soon as possible for a visit  with Gracelyn Nurse, MD.   Specialty: Internal Medicine Contact information: [address] [place] [address] [phone]                Discharge Exam: Ceasar Mons Weights   05/06/22 0302  Weight: 49 kg   Constitutional:      Appearance: She is normal weight.  HENT:     Head: Normocephalic and atraumatic.  Eyes:     Pupils: Pupils are equal, round, and reactive to light.  Cardiovascular:     Rate and Rhythm: Normal rate and regular rhythm.  Pulmonary:     Effort: Pulmonary effort is normal.  Abdominal:     General: Bowel sounds are normal.  Comments: Nontender Musculoskeletal:        General: Normal range of motion.  Skin:    General: Skin is dry.  Neurological:     General: Appears lethargic  Condition at discharge: good  Discharge time spent: greater than 30 minutes.  Signed: Loyce Dys, MD [facility] 05/11/2022

## 2022-05-11 NOTE — Progress Notes (Signed)
The patient is receiving Protonix by the intravenous route.  Based on criteria approved by the Pharmacy and Therapeutics Committee and the Medical Executive Committee, the medication is being converted to the equivalent oral dose form.  These criteria include: -No active GI bleeding -Able to tolerate diet of full liquids (or better) or tube feeding -Able to tolerate other medications by the oral or enteral route  If you have any questions about this conversion, please contact the Pharmacy Department.  Thank you.   Suetta Hoffmeister III, PharmD Clinical Pharmacist 

## 2022-05-11 NOTE — Inpatient Diabetes Management (Signed)
Inpatient Diabetes Program Recommendations  AACE/ADA: New Consensus Statement on Inpatient Glycemic Control (2015)  Target Ranges:  Prepandial:   less than 140 mg/dL      Peak postprandial:   less than 180 mg/dL (1-2 hours)      Critically ill patients:  140 - 180 mg/dL   Lab Results  Component Value Date   GLUCAP 151 (H) 05/11/2022   HGBA1C 7.8 (H) 05/06/2022    Latest Reference Range & Units 05/10/22 08:38 05/10/22 12:09 05/10/22 17:05 05/10/22 20:09 05/10/22 23:32 05/11/22 00:02 05/11/22 03:41 05/11/22 07:44  Glucose-Capillary 70 - 99 mg/dL 376 (H) Novolog 2 units 278 (H) Novolog 3 units 375 (H) Novolog 9 units@ 1812 424 (H) Novolog 10 units @ 2034 32 (LL) 97 172 (H) 151 (H)  (LL): Data is critically low (H): Data is abnormally high  Outpatient Diabetes medications:  Endo-Dr. Tedd Sias Omnipod insulin pump with contour next CGM Basal rate  12 am 0.65 units/hr  3 am 0.45 units/hr  6 am 0.85 units/hr  8 am 0.6 units/hr  6 pm 0.45 units/hr 24hr basal = 13.7 units  Bolus settings:  Sensitivity 12 am = 90  Carb ratio 12am = 18  Target blood glucose 120  Correction 130    Current orders for Inpatient glycemic control: Novolog 0-9 units Q4H  Inpatient Diabetes Program Recommendations:   Patient had hypoglycemia post novolog correction (total 19 units within 2 1/2 hrs Please consider: -Add Semglee 6 units qd -Decrease Novolog correction to 0-6 units tid, 0-5 units hs  Thank you, Darel Hong E. Maghan Jessee, RN, MSN, CDE  Diabetes Coordinator Inpatient Glycemic Control Team Team Pager [phone] (8am-5pm) 05/11/2022 9:11 AM

## 2022-05-11 NOTE — Progress Notes (Signed)
Patient notified of discharge. Discharge education and materials provided to patient, all questions addressed at this time. Iv removed with no complications. All belongings gathered by patient and daughter at bedside and taken with patient at discharge, patient escorted off the unit by member of hospital volunteer services

## 2022-05-18 LAB — PROTHROMBIN GENE MUTATION

## 2022-05-19 ENCOUNTER — Other Ambulatory Visit: Payer: Self-pay | Admitting: Internal Medicine

## 2022-05-19 DIAGNOSIS — Z09 Encounter for follow-up examination after completed treatment for conditions other than malignant neoplasm: Secondary | ICD-10-CM

## 2022-05-19 DIAGNOSIS — R911 Solitary pulmonary nodule: Secondary | ICD-10-CM

## 2022-05-20 LAB — FACTOR 5 LEIDEN

## 2022-05-25 ENCOUNTER — Ambulatory Visit
Admission: RE | Admit: 2022-05-25 | Discharge: 2022-05-25 | Disposition: A | Discharge status: Home / Self Care | Payer: Medicare Other | Source: Ambulatory Visit | Attending: Internal Medicine | Admitting: Internal Medicine

## 2022-05-25 DIAGNOSIS — R911 Solitary pulmonary nodule: Secondary | ICD-10-CM | POA: Insufficient documentation

## 2022-05-25 DIAGNOSIS — Z09 Encounter for follow-up examination after completed treatment for conditions other than malignant neoplasm: Secondary | ICD-10-CM | POA: Insufficient documentation

## 2022-05-25 MED ORDER — IOHEXOL 300 MG/ML  SOLN
75.0000 mL | Freq: Once | INTRAMUSCULAR | Status: AC | PRN
  Administered 2022-05-25: 75 mL via INTRAVENOUS

## 2022-05-26 ENCOUNTER — Ambulatory Visit
Payer: Medicare Other | Attending: Student in an Organized Health Care Education/Training Program | Admitting: Student in an Organized Health Care Education/Training Program

## 2022-05-26 ENCOUNTER — Encounter: Payer: Self-pay | Admitting: Student in an Organized Health Care Education/Training Program

## 2022-05-26 VITALS — BP 131/87 | HR 74 | Temp 98.1°F | Resp 16 | Ht 68.0 in | Wt 106.1 lb

## 2022-05-26 DIAGNOSIS — M47816 Spondylosis without myelopathy or radiculopathy, lumbar region: Secondary | ICD-10-CM | POA: Insufficient documentation

## 2022-05-26 DIAGNOSIS — M25551 Pain in right hip: Secondary | ICD-10-CM | POA: Diagnosis present

## 2022-05-26 DIAGNOSIS — Z96612 Presence of left artificial shoulder joint: Secondary | ICD-10-CM | POA: Insufficient documentation

## 2022-05-26 DIAGNOSIS — Z8781 Personal history of (healed) traumatic fracture: Secondary | ICD-10-CM | POA: Diagnosis present

## 2022-05-26 DIAGNOSIS — M5136 Other intervertebral disc degeneration, lumbar region: Secondary | ICD-10-CM | POA: Diagnosis present

## 2022-05-26 DIAGNOSIS — G8929 Other chronic pain: Secondary | ICD-10-CM

## 2022-05-26 DIAGNOSIS — G894 Chronic pain syndrome: Secondary | ICD-10-CM | POA: Diagnosis present

## 2022-05-26 DIAGNOSIS — M25512 Pain in left shoulder: Secondary | ICD-10-CM | POA: Diagnosis present

## 2022-05-26 DIAGNOSIS — S72144S Nondisplaced intertrochanteric fracture of right femur, sequela: Secondary | ICD-10-CM | POA: Insufficient documentation

## 2022-05-26 DIAGNOSIS — Z9889 Other specified postprocedural states: Secondary | ICD-10-CM | POA: Insufficient documentation

## 2022-05-26 MED ORDER — TRAMADOL HCL 50 MG PO TABS
50.0000 mg | ORAL_TABLET | Freq: Two times a day (BID) | ORAL | 2 refills | Status: AC | PRN
Start: 2022-05-28 — End: 2022-08-26

## 2022-05-26 NOTE — Progress Notes (Signed)
PROVIDER NOTE: Information contained herein reflects review and annotations entered in association with encounter. Interpretation of such information and data should be left to medically-trained personnel. Information provided to patient can be located elsewhere in the medical record under "Patient Instructions". Document created using STT-dictation technology, any transcriptional errors that may result from process are unintentional.    Patient: Suzanne Snyder  Service Category: E/M  Provider: Edward Jolly, MD  DOB: [birth year]  DOS: 05/26/2022  Referring Provider: Gracelyn Nurse, MD  MRN: [id number]  Specialty: Interventional Pain Management  PCP: Suzanne Nurse, MD  Type: Established Patient  Setting: Ambulatory outpatient    Location: Office  Delivery: Face-to-face     HPI  [name], a 72 y.o. year old female, is here today because of her Right hip pain [M25.551]. Ms. Szucs primary complain today is Back Pain (lower), Shoulder Pain (left), and Hip Pain (bilateral) Last encounter: My last encounter with her was on 12/29/2021.  Pain Assessment: Severity of   is reported as a 7 /10. Location: Back Lower/denies. Onset: More than a month ago. Quality: Aching, Constant, Sore (intense). Timing: Constant. Modifying factor(s): meds, rest. Vitals:  height is 5\' 8"  (1.727 m) and weight is 106 lb 1.6 oz (48.1 kg). Her temperature is 98.1 F (36.7 C). Her blood pressure is 131/87 and her pulse is 74. Her respiration is 16 and oxygen saturation is 100%.  BMI: Estimated body mass index is 16.13 kg/m as calculated from the following:   Height as of this encounter: 5\' 8"  (1.727 m).   Weight as of this encounter: 106 lb 1.6 oz (48.1 kg).  Reason for encounter: medication management.   No change in medical history since last visit.  Patient's pain is at baseline.  Patient continues multimodal pain regimen as prescribed.  States that it provides pain relief and improvement in functional  status. Persistent myalgias and arthralgias.  She has a history of total left arthroplasty x 2 along with 2 spinal surgeries. She finds benefit with tramadol.  We discussed the importance of medication compliance and the controlled substance agreement that she signed.  Pharmacotherapy Assessment  Analgesic: Tramadol 50 mg to 100 mg every 12 hours as needed  Monitoring: [place] PMP: PDMP reviewed during this encounter.       Pharmacotherapy: No side-effects or adverse reactions reported. Compliance: No problems identified. Effectiveness: Clinically acceptable.  Suzanne Pies, RN  05/26/2022 11:57 AM  Sign when Signing Visit Nursing Pain Medication Assessment:  Safety precautions to be maintained throughout the outpatient stay will include: orient to surroundings, keep bed in low position, maintain call bell within reach at all times, provide assistance with transfer out of bed and ambulation.  Medication Inspection Compliance: Pill count conducted under aseptic conditions, in front of the patient. Neither the pills nor the bottle was removed from the patient's sight at any time. Once count was completed pills were immediately returned to the patient in their original bottle.  Medication: Tramadol (Ultram) Pill/Patch Count:  6 of 120 pills remain Pill/Patch Appearance: Markings consistent with prescribed medication Bottle Appearance: Standard pharmacy container. Clearly labeled. Filled Date: 4 / 42 / 2024 Last Medication intake:  Today    No results found for: "CBDTHCR" No results found for: "D8THCCBX" No results found for: "D9THCCBX"  UDS:  Summary  Date Value Ref Range Status  11/27/2021 Note  Final    Comment:    ==================================================================== Compliance Drug Analysis, Ur ==================================================================== Test  Result       Flag       Units  Drug Present and Declared for  Prescription Verification   7-aminoclonazepam              267          EXPECTED   ng/mg creat    7-aminoclonazepam is an expected metabolite of clonazepam. Source of    clonazepam is a scheduled prescription medication.    Gabapentin                     PRESENT      EXPECTED   Cyclobenzaprine                PRESENT      EXPECTED   Desmethylcyclobenzaprine       PRESENT      EXPECTED    Desmethylcyclobenzaprine is an expected metabolite of    cyclobenzaprine.  Drug Present not Declared for Prescription Verification   Tramadol                       6697         UNEXPECTED ng/mg creat   O-Desmethyltramadol            3727         UNEXPECTED ng/mg creat   N-Desmethyltramadol            1160         UNEXPECTED ng/mg creat    Source of tramadol is a prescription medication. O-desmethyltramadol    and N-desmethyltramadol are expected metabolites of tramadol.    Acetaminophen                  PRESENT      UNEXPECTED  Drug Absent but Declared for Prescription Verification   Doxepin                        Not Detected UNEXPECTED   Salicylate                     Not Detected UNEXPECTED    Aspirin, as indicated in the declared medication list, is not always    detected even when used as directed.    Diphenhydramine                Not Detected UNEXPECTED   Hydroxyzine                    Not Detected UNEXPECTED ==================================================================== Test                      Result    Flag   Units      Ref Range   Creatinine              30               mg/dL      >=09 ==================================================================== Declared Medications:  The flagging and interpretation on this report are based on the  following declared medications.  Unexpected results may arise from  inaccuracies in the declared medications.   **Note: The testing scope of this panel includes these medications:   Clonazepam (Klonopin)  Cyclobenzaprine (Flexeril)   Diphenhydramine  Doxepin (Sinequan)  Gabapentin (Neurontin)  Hydroxyzine (Atarax)   **Note: The testing scope of this panel does not include small to  moderate amounts of these reported medications:  Aspirin   **Note: The testing scope of this panel does not include the  following reported medications:   Albuterol (Ventolin HFA)  Ammonium Hydroxide (Ammonium Lactate)  Celecoxib (Celebrex)  Clobetasol (Temovate)  Fish Oil  Insulin (NovoLog)  Ipratropium (Atrovent)  Lactic Acid (Ammonium Lactate)  Mometasone  Multivitamin  Ropinirole (Requip)  Triamcinolone (Kenalog)  Vitamin D2 (Drisdol) ==================================================================== For clinical consultation, please call [phone]. ====================================================================       ROS  Constitutional: Denies any fever or chills Gastrointestinal: No reported hemesis, hematochezia, vomiting, or acute GI distress Musculoskeletal: Right hip pain, low back pain, left shoulder pain Neurological: No reported episodes of acute onset apraxia, aphasia, dysarthria, agnosia, amnesia, paralysis, loss of coordination, or loss of consciousness  Medication Review  Fish Oil, Multiple Vitamin, Probiotic, Vitamin D (Ergocalciferol), acetaminophen, albuterol, apixaban, bisacodyl, clonazePAM, cyclobenzaprine, furosemide, gabapentin, hydrOXYzine, insulin aspart, ipratropium, iron polysaccharides, pantoprazole, polyethylene glycol, polyvinyl alcohol, rOPINIRole, and traMADol  History Review  Allergy: Ms. Galligher is allergic to cat hair extract, pregabalin, and erythromycin. Drug: Ms. Shala  reports no history of drug use. Alcohol:  reports no history of alcohol use. Tobacco:  reports that she has been smoking cigarettes. She has a 50.00 pack-year smoking history. She has never used smokeless tobacco. Social: Ms. Billock  reports that she has been smoking cigarettes. She has a 50.00 pack-year  smoking history. She has never used smokeless tobacco. She reports that she does not drink alcohol and does not use drugs. Medical:  has a past medical history of Anxiety, Arthritis, Asthma, Diabetes mellitus without complication (HCC), Insulin pump in place, Osteoporosis, RLS (restless legs syndrome), Smokers' cough (HCC), and Wears dentures. Surgical: Ms. Bayona  has a past surgical history that includes Joint replacement (Left, 2007); Spine surgery (N/A, 1984); Rotator cuff repair (Left, 2014 and 2015); Thyroid surgery; Cataract extraction w/PHACO (Right, 02/20/2020); Cataract extraction w/PHACO (Left, 03/05/2020); and Hip pinning, cannulated (Right, 02/09/2022). Family: family history includes Parkinsonism in her father.  Laboratory Chemistry Profile   Renal Lab Results  Component Value Date   BUN 9 05/11/2022   CREATININE 0.44 05/11/2022   GFRAA >60 01/19/2018   GFRNONAA >60 05/11/2022    Hepatic Lab Results  Component Value Date   AST 13 (L) 05/07/2022   ALT 12 05/07/2022   ALBUMIN 3.0 (L) 05/07/2022   ALKPHOS 70 05/07/2022   LIPASE 19 05/06/2022    Electrolytes Lab Results  Component Value Date   NA 134 (L) 05/11/2022   K 4.0 05/11/2022   CL 104 05/11/2022   CALCIUM 7.6 (L) 05/11/2022   MG 1.5 (L) 02/17/2022   PHOS 2.8 02/17/2022    Bone No results found for: "VD25OH", "VD125OH2TOT", "UJ8119JY7", "WG9562ZH0", "25OHVITD1", "25OHVITD2", "25OHVITD3", "TESTOFREE", "TESTOSTERONE"  Inflammation (CRP: Acute Phase) (ESR: Chronic Phase) Lab Results  Component Value Date   ESRSEDRATE 42 (H) 12/21/2020   LATICACIDVEN 1.9 05/06/2022         Note: Above Lab results reviewed.  Recent Imaging Review  DG Abd Portable 1V-Small Bowel Obstruction Protocol-initial, 8 hr delay CLINICAL DATA:  8 hour delay small-bowel obstruction  EXAM: PORTABLE ABDOMEN - 1 VIEW  COMPARISON:  05/07/2022  FINDINGS: Contrast material is demonstrated throughout the colon. This suggests that there is  no complete obstruction. Enteric tube tip in the left upper quadrant consistent with location in the body of the stomach. Calcifications in the mid upper abdomen likely representing pancreatic calcification and suggesting chronic pancreatitis. Surgical clips in the right upper quadrant. Internal fixation of both hips. Lumbar  scoliosis convex towards the left. Degenerative changes in the spine and hips. Lung bases are clear.  IMPRESSION: Contrast material demonstrated throughout the colon suggesting no complete small bowel obstruction.  Electronically Signed   By: Burman Nieves M.D.   On: 05/07/2022 22:14 DG Abd Portable 1V-Small Bowel Protocol-Position Verification CLINICAL DATA:  NG tube placement  EXAM: PORTABLE ABDOMEN - 1 VIEW  COMPARISON:  05/07/2022, 7:18 a.m.  FINDINGS: Unchanged position of esophagogastric tube, tip and side port below the diaphragm. Nonobstructive pattern of bowel gas. Scattered gas and stool present to the rectum. No free air.  IMPRESSION: Unchanged position of esophagogastric tube, tip and side port below the diaphragm. Nonobstructive pattern of bowel gas.  Electronically Signed   By: Jearld Lesch M.D.   On: 05/07/2022 14:56 DG ABD ACUTE 2+V W 1V CHEST CLINICAL DATA:  Small bowel obstruction.  EXAM: DG ABDOMEN ACUTE WITH 1 VIEW CHEST  COMPARISON:  Chest x-ray dated May 06, 2022  FINDINGS: Patient is rotated to the right. Cardiac and mediastinal contours are within normal limits. Right-greater-than-left basilar opacities, likely atelectasis. Skinfolds overlying the lung apex. No evidence of pneumothorax. Prior left total shoulder replacement.  NG/OG tube tip and side port are in the stomach. No gas-filled dilated loops of bowel visualized. Advanced degenerative changes of the lumbar spine with associated levocurvature. Cholecystectomy clips.  IMPRESSION: NG/OG tube tip and side port are in the stomach.  Electronically Signed    By: Allegra Lai M.D.   On: 05/07/2022 09:03 Note: Reviewed        Physical Exam  General appearance: Well nourished, well developed, and well hydrated. In no apparent acute distress Mental status: Alert, oriented x 3 (person, place, & time)       Respiratory: No evidence of acute respiratory distress Eyes: PERLA Vitals: BP 131/87   Pulse 74   Temp 98.1 F (36.7 C)   Resp 16   Ht 5\' 8"  (1.727 m)   Wt 106 lb 1.6 oz (48.1 kg)   SpO2 100%   BMI 16.13 kg/m  BMI: Estimated body mass index is 16.13 kg/m as calculated from the following:   Height as of this encounter: 5\' 8"  (1.727 m).   Weight as of this encounter: 106 lb 1.6 oz (48.1 kg). Ideal: Ideal body weight: 63.9 kg (140 lb 14 oz)  Right hip pain, worse with hip flexion and internal rotation  Assessment   Diagnosis Status  1. Right hip pain   2. Closed nondisplaced intertrochanteric fracture of right femur, sequela   3. S/P ORIF (open reduction internal fixation) fracture- RIGHT hip   4. Lumbar facet arthropathy   5. Lumbar degenerative disc disease   6. Lumbar spondylosis   7. History of left shoulder replacement   8. Chronic left shoulder pain   9. Chronic pain syndrome    Controlled Controlled Controlled    Plan of Care    [name] has a current medication list which includes the following long-term medication(s): albuterol, apixaban, clonazepam, furosemide, gabapentin, insulin aspart, ipratropium, pantoprazole, ropinirole, apixaban, and iron polysaccharides.  Pharmacotherapy (Medications Ordered): Meds ordered this encounter  Medications   traMADol (ULTRAM) 50 MG tablet    Sig: Take 1-2 tablets (50-100 mg total) by mouth 2 (two) times daily as needed.    Dispense:  120 tablet    Refill:  2   Continue with multimodal analgesics as prescribed  Follow-up plan:   Return in about 3 months (around 08/26/2022) for Medication Management,  in person.      Recent Visits Date Type Provider Dept   03/10/22 Office Visit Suzanne Jolly, MD [facility]  Showing recent visits within past 90 days and meeting all other requirements Today's Visits Date Type Provider Dept  05/26/22 Office Visit Suzanne Jolly, MD [facility]  Showing today's visits and meeting all other requirements Future Appointments No visits were found meeting these conditions. Showing future appointments within next 90 days and meeting all other requirements  I discussed the assessment and treatment plan with the patient. The patient was provided an opportunity to ask questions and all were answered. The patient agreed with the plan and demonstrated an understanding of the instructions.  Patient advised to call back or seek an in-person evaluation if the symptoms or condition worsens.  Duration of encounter: .  Total time on encounter, as per AMA guidelines included both the face-to-face and non-face-to-face time personally spent by the physician and/or other qualified health care professional(s) on the day of the encounter (includes time in activities that require the physician or other qualified health care professional and does not include time in activities normally performed by clinical staff). Physician's time may include the following activities when performed: Preparing to see the patient (e.g., pre-charting review of records, searching for previously ordered imaging, lab work, and nerve conduction tests) Review of prior analgesic pharmacotherapies. Reviewing PMP Interpreting ordered tests (e.g., lab work, imaging, nerve conduction tests) Performing post-procedure evaluations, including interpretation of diagnostic procedures Obtaining and/or reviewing separately obtained history Performing a medically appropriate examination and/or evaluation Counseling and educating the patient/family/caregiver Ordering medications, tests, or procedures Referring and communicating with other health  care professionals (when not separately reported) Documenting clinical information in the electronic or other health record Independently interpreting results (not separately reported) and communicating results to the patient/ family/caregiver Care coordination (not separately reported)  Note by: Suzanne Jolly, MD Date: 05/26/2022; Time: 1:57 PM

## 2022-05-26 NOTE — Progress Notes (Signed)
Nursing Pain Medication Assessment:  Safety precautions to be maintained throughout the outpatient stay will include: orient to surroundings, keep bed in low position, maintain call bell within reach at all times, provide assistance with transfer out of bed and ambulation.  Medication Inspection Compliance: Pill count conducted under aseptic conditions, in front of the patient. Neither the pills nor the bottle was removed from the patient's sight at any time. Once count was completed pills were immediately returned to the patient in their original bottle.  Medication: Tramadol (Ultram) Pill/Patch Count:  6 of 120 pills remain Pill/Patch Appearance: Markings consistent with prescribed medication Bottle Appearance: Standard pharmacy container. Clearly labeled. Filled Date: 4 / 52 / 2024 Last Medication intake:  Today

## 2022-05-26 NOTE — Patient Instructions (Signed)

## 2022-06-11 ENCOUNTER — Other Ambulatory Visit: Payer: Self-pay | Admitting: Internal Medicine

## 2022-06-11 DIAGNOSIS — K7689 Other specified diseases of liver: Secondary | ICD-10-CM

## 2022-06-22 ENCOUNTER — Ambulatory Visit
Admission: RE | Admit: 2022-06-22 | Discharge: 2022-06-22 | Disposition: A | Discharge status: Home / Self Care | Payer: Medicare Other | Source: Ambulatory Visit | Attending: Internal Medicine | Admitting: Internal Medicine

## 2022-06-22 DIAGNOSIS — K7689 Other specified diseases of liver: Secondary | ICD-10-CM | POA: Insufficient documentation

## 2022-06-22 MED ORDER — GADOBUTROL 1 MMOL/ML IV SOLN
4.0000 mL | Freq: Once | INTRAVENOUS | Status: AC | PRN
  Administered 2022-06-22: 4 mL via INTRAVENOUS

## 2022-06-23 ENCOUNTER — Other Ambulatory Visit: Payer: Medicare Other

## 2022-08-04 ENCOUNTER — Other Ambulatory Visit: Payer: Self-pay | Admitting: Internal Medicine

## 2022-08-04 DIAGNOSIS — Z1231 Encounter for screening mammogram for malignant neoplasm of breast: Secondary | ICD-10-CM

## 2022-08-25 ENCOUNTER — Ambulatory Visit (HOSPITAL_BASED_OUTPATIENT_CLINIC_OR_DEPARTMENT_OTHER): Payer: Medicare Other | Admitting: Student in an Organized Health Care Education/Training Program

## 2022-08-25 ENCOUNTER — Encounter: Payer: Self-pay | Admitting: Student in an Organized Health Care Education/Training Program

## 2022-08-25 ENCOUNTER — Ambulatory Visit
Admission: RE | Admit: 2022-08-25 | Discharge: 2022-08-25 | Disposition: A | Discharge status: Home / Self Care | Payer: Medicare Other | Source: Ambulatory Visit | Attending: Student in an Organized Health Care Education/Training Program | Admitting: Student in an Organized Health Care Education/Training Program

## 2022-08-25 VITALS — BP 150/92 | HR 69 | Temp 96.8°F | Resp 18 | Ht 68.0 in | Wt 115.0 lb

## 2022-08-25 DIAGNOSIS — R0781 Pleurodynia: Secondary | ICD-10-CM

## 2022-08-25 DIAGNOSIS — G894 Chronic pain syndrome: Secondary | ICD-10-CM

## 2022-08-25 DIAGNOSIS — Z96612 Presence of left artificial shoulder joint: Secondary | ICD-10-CM | POA: Insufficient documentation

## 2022-08-25 DIAGNOSIS — G588 Other specified mononeuropathies: Secondary | ICD-10-CM | POA: Insufficient documentation

## 2022-08-25 NOTE — Progress Notes (Signed)
PROVIDER NOTE: Information contained herein reflects review and annotations entered in association with encounter. Interpretation of such information and data should be left to medically-trained personnel. Information provided to patient can be located elsewhere in the medical record under "Patient Instructions". Document created using STT-dictation technology, any transcriptional errors that may result from process are unintentional.    Patient: Suzanne Snyder  Service Category: E/M  Provider: Edward Jolly, MD  DOB: [birth year]  DOS: 08/25/2022  Referring Provider: Gracelyn Nurse, MD  MRN: [id number]  Specialty: Interventional Pain Management  PCP: Gracelyn Nurse, MD  Type: Established Patient  Setting: Ambulatory outpatient    Location: Office  Delivery: Face-to-face     HPI  [name], a 72 y.o. year old female, is here today because of her Rib pain on left side [R07.81]. Ms. Coward primary complain today is Shoulder Pain (left), Back Pain, and Hip Pain (bilat)   Pain Assessment: Severity of Chronic pain is reported as a 8 /10. Location: Shoulder Left/down left arm to fingers. Onset: More than a month ago. Quality: Sharp, Other (Comment) (loud). Timing: Constant. Modifying factor(s): meds. Vitals:  height is 5\' 8"  (1.727 m) and weight is 115 lb (52.2 kg). Her temperature is 96.8 F (36 C) (abnormal). Her blood pressure is 150/92 (abnormal) and her pulse is 69. Her respiration is 18 and oxygen saturation is 97%.  BMI: Estimated body mass index is 17.49 kg/m as calculated from the following:   Height as of this encounter: 5\' 8"  (1.727 m).   Weight as of this encounter: 115 lb (52.2 kg). Last encounter: 05/26/2022. Last procedure: Visit date not found.  Reason for encounter: patient-requested evaluation.   Patient presents today with increased left-sided rib pain.  She states that her pain feels similar to pain related to the fractures in the past.  No inciting traumatic event.   She has pain with deep inspiration.  She continues tramadol as prescribed.  She has 2 prescriptions remaining at her pharmacy to be filled.   ROS  Constitutional: Denies any fever or chills Gastrointestinal: No reported hemesis, hematochezia, vomiting, or acute GI distress Musculoskeletal:  Left-sided rib pain Neurological: No reported episodes of acute onset apraxia, aphasia, dysarthria, agnosia, amnesia, paralysis, loss of coordination, or loss of consciousness  Medication Review  Fish Oil, Multiple Vitamin, Probiotic, Vitamin D (Ergocalciferol), acetaminophen, albuterol, apixaban, bisacodyl, clonazePAM, cyclobenzaprine, furosemide, gabapentin, hydrOXYzine, insulin aspart, ipratropium, iron polysaccharides, pantoprazole, polyethylene glycol, polyvinyl alcohol, rOPINIRole, and traMADol  History Review  Allergy: Ms. Foust is allergic to cat hair extract, pregabalin, and erythromycin. Drug: Ms. Baumhover  reports no history of drug use. Alcohol:  reports no history of alcohol use. Tobacco:  reports that she has been smoking cigarettes. She has a 50 pack-year smoking history. She has never used smokeless tobacco. Social: Ms. Mcglown  reports that she has been smoking cigarettes. She has a 50 pack-year smoking history. She has never used smokeless tobacco. She reports that she does not drink alcohol and does not use drugs. Medical:  has a past medical history of Anxiety, Arthritis, Asthma, Diabetes mellitus without complication (HCC), Insulin pump in place, Osteoporosis, RLS (restless legs syndrome), Smokers' cough (HCC), and Wears dentures. Surgical: Ms. Viramontes  has a past surgical history that includes Joint replacement (Left, 2007); Spine surgery (N/A, 1984); Rotator cuff repair (Left, 2014 and 2015); Thyroid surgery; Cataract extraction w/PHACO (Right, 02/20/2020); Cataract extraction w/PHACO (Left, 03/05/2020); and Hip pinning, cannulated (Right, 02/09/2022). Family: family history includes Parkinsonism  in her father.  Laboratory Chemistry Profile   Renal Lab Results  Component Value Date   BUN 9 05/11/2022   CREATININE 0.44 05/11/2022   GFRAA >60 01/19/2018   GFRNONAA >60 05/11/2022    Hepatic Lab Results  Component Value Date   AST 13 (L) 05/07/2022   ALT 12 05/07/2022   ALBUMIN 3.0 (L) 05/07/2022   ALKPHOS 70 05/07/2022   LIPASE 19 05/06/2022    Electrolytes Lab Results  Component Value Date   NA 134 (L) 05/11/2022   K 4.0 05/11/2022   CL 104 05/11/2022   CALCIUM 7.6 (L) 05/11/2022   MG 1.5 (L) 02/17/2022   PHOS 2.8 02/17/2022    Bone No results found for: "VD25OH", "VD125OH2TOT", "ZO1096EA5", "WU9811BJ4", "25OHVITD1", "25OHVITD2", "25OHVITD3", "TESTOFREE", "TESTOSTERONE"  Inflammation (CRP: Acute Phase) (ESR: Chronic Phase) Lab Results  Component Value Date   ESRSEDRATE 42 (H) 12/21/2020   LATICACIDVEN 1.9 05/06/2022         Note: Above Lab results reviewed.  Recent Imaging Review  MR ABDOMEN WWO CONTRAST CLINICAL DATA:  Evaluate liver lesions identified on CT from 05/25/2022  EXAM: MRI ABDOMEN WITHOUT AND WITH CONTRAST  TECHNIQUE: Multiplanar multisequence MR imaging of the abdomen was performed both before and after the administration of intravenous contrast.  CONTRAST:  4mL GADAVIST GADOBUTROL 1 MMOL/ML IV SOLN  COMPARISON:  CT chest from 05/25/2022  FINDINGS: Lower chest: No acute abnormality.  Hepatobiliary: Diminished exam detail secondary to motion artifact. Heterogeneous arterial phase perfusion noted with relative hyperintensity in the posterior right hepatic lobe, image 32/13. On the portal venous phase images there is uniform signal and enhancement throughout the liver without focal liver lesion. No suspicious enhancing liver lesion identified.  Cholecystectomy. No significant intrahepatic bile duct dilatation. Proximal common bile duct measures 7 mm. There is a CBD stricture at the level of the head of pancreas, image 17/3. The  distal CBD measures 6 mm.  Pancreas: Better seen on the CT images are extensive calcifications throughout the pancreas compatible with sequelae of chronic pancreatitis. Atrophy of the tail of pancreas. No signs main duct dilatation.  Spleen:  Within normal limits in size and appearance.  Adrenals/Urinary Tract: Normal right adrenal gland. Thickening of the left adrenal gland is unchanged. No signs of kidney mass or obstructive uropathy.  Stomach/Bowel: Stomach appears non distended. No dilated loops of bowel. Moderate stool burden noted throughout the colon.  Vascular/Lymphatic: Normal caliber abdominal aorta. Aortic atherosclerosis. Intrahepatic portal veins appear patent. 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 This is likely the sequelae of chronic pancreatitis. No abdominal adenopathy.  Other:  None  Musculoskeletal: No suspicious bone lesions identified. There is a marked curvature of the lumbar spine which is convex towards the left. Multi level degenerative disc disease noted. No suspicious bone lesions identified.  IMPRESSION: 1. No suspicious liver lesion identified. 2. There is a heterogeneous arterial phase perfusion of the liver which is favored to represent altered perfusion to the liver secondary to chronic portal vein occlusion and cavernous transformation. 3. 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 4. 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 No significant intrahepatic bile duct dilatation.  Electronically Signed   By: Signa Kell M.D.   On: 06/28/2022 08:36 Note: Reviewed        Physical Exam  General appearance: Well nourished, well developed, and well hydrated. In no apparent acute distress Mental status: Alert, oriented x  3 (person, place, & time)       Respiratory: No evidence of acute respiratory distress Eyes: PERLA Vitals:  BP (!) 150/92   Pulse 69   Temp (!) 96.8 F (36 C)   Resp 18   Ht 5\' 8"  (1.727 m)   Wt 115 lb (52.2 kg)   SpO2 97%   BMI 17.49 kg/m  BMI: Estimated body mass index is 17.49 kg/m as calculated from the following:   Height as of this encounter: 5\' 8"  (1.727 m).   Weight as of this encounter: 115 lb (52.2 kg). Ideal: Ideal body weight: 63.9 kg (140 lb 14 oz)  Left-sided rib pain  Assessment   Diagnosis  1. Rib pain on left side   2. Intercostal neuralgia   3. History of left shoulder replacement   4. Chronic pain syndrome      Updated Problems: Problem  Rib Pain On Left Side    Plan of Care    Orders:  Orders Placed This Encounter  Procedures   DG Ribs Bilateral    Left sided rib pain    Standing Status:   Future    Standing Expiration Date:   08/25/2023    Order Specific Question:   Reason for Exam (SYMPTOM  OR DIAGNOSIS REQUIRED)    Answer:   concern for left rib fx    Order Specific Question:   Preferred imaging location?    Answer:   [facility]   Follow-up plan:   Return for patient will call to schedule F2F appt prn.      Recent Visits No visits were found meeting these conditions. Showing recent visits within past 90 days and meeting all other requirements Today's Visits Date Type Provider Dept  08/25/22 Office Visit Edward Jolly, MD [facility]  Showing today's visits and meeting all other requirements Future Appointments No visits were found meeting these conditions. Showing future appointments within next 90 days and meeting all other requirements  I discussed the assessment and treatment plan with the patient. The patient was provided an opportunity to ask questions and all were answered. The patient agreed with the plan and demonstrated an understanding of the instructions.  Patient advised to call back or seek an in-person evaluation if the symptoms or condition worsens.  Duration of encounter: .  Total time on  encounter, as per AMA guidelines included both the face-to-face and non-face-to-face time personally spent by the physician and/or other qualified health care professional(s) on the day of the encounter (includes time in activities that require the physician or other qualified health care professional and does not include time in activities normally performed by clinical staff). Physician's time may include the following activities when performed: Preparing to see the patient (e.g., pre-charting review of records, searching for previously ordered imaging, lab work, and nerve conduction tests) Review of prior analgesic pharmacotherapies. Reviewing PMP Interpreting ordered tests (e.g., lab work, imaging, nerve conduction tests) Performing post-procedure evaluations, including interpretation of diagnostic procedures Obtaining and/or reviewing separately obtained history Performing a medically appropriate examination and/or evaluation Counseling and educating the patient/family/caregiver Ordering medications, tests, or procedures Referring and communicating with other health care professionals (when not separately reported) Documenting clinical information in the electronic or other health record Independently interpreting results (not separately reported) and communicating results to the patient/ family/caregiver Care coordination (not separately reported)  Note by: Edward Jolly, MD Date: 08/25/2022; Time: 1:49 PM

## 2022-08-25 NOTE — Progress Notes (Signed)
Nursing Pain Medication Assessment:  Safety precautions to be maintained throughout the outpatient stay will include: orient to surroundings, keep bed in low position, maintain call bell within reach at all times, provide assistance with transfer out of bed and ambulation.  Medication Inspection Compliance: Pill count conducted under aseptic conditions, in front of the patient. Neither the pills nor the bottle was removed from the patient's sight at any time. Once count was completed pills were immediately returned to the patient in their original bottle.  Medication: Tramadol (Ultram) Pill/Patch Count:  3 of 120 pills remain Pill/Patch Appearance: Markings consistent with prescribed medication Bottle Appearance: Standard pharmacy container. Clearly labeled. Filled Date: 5 / 73 / 2024 Last Medication intake:  Today

## 2022-09-07 ENCOUNTER — Telehealth: Payer: Self-pay | Admitting: *Deleted

## 2022-09-07 NOTE — Telephone Encounter (Signed)
X-ray results read to patient. 

## 2022-09-07 NOTE — Telephone Encounter (Signed)
-----   Message from Edward Jolly sent at 09/02/2022  4:11 PM EDT ----- Please inform patient of xray results, no new rib fx ----- Message ----- From: Interface, Rad Results In Sent: 09/02/2022  11:55 AM EDT To: Edward Jolly, MD

## 2022-10-21 ENCOUNTER — Encounter: Payer: Self-pay | Admitting: Dermatology

## 2022-10-21 ENCOUNTER — Ambulatory Visit: Payer: Medicare Other | Admitting: Dermatology

## 2022-10-21 DIAGNOSIS — L821 Other seborrheic keratosis: Secondary | ICD-10-CM

## 2022-10-21 DIAGNOSIS — L2089 Other atopic dermatitis: Secondary | ICD-10-CM

## 2022-10-21 DIAGNOSIS — B351 Tinea unguium: Secondary | ICD-10-CM | POA: Diagnosis not present

## 2022-10-21 DIAGNOSIS — B353 Tinea pedis: Secondary | ICD-10-CM | POA: Diagnosis not present

## 2022-10-21 MED ORDER — CLOBETASOL PROPIONATE 0.05 % EX SOLN
CUTANEOUS | 2 refills | Status: DC
Start: 2022-10-21 — End: 2023-11-17

## 2022-10-21 MED ORDER — DUPILUMAB 300 MG/2ML ~~LOC~~ SOSY
600.0000 mg | PREFILLED_SYRINGE | Freq: Once | SUBCUTANEOUS | Status: AC
Start: 2022-10-21 — End: 2022-10-21
  Administered 2022-10-21: 600 mg via SUBCUTANEOUS

## 2022-10-21 MED ORDER — HYDROCORTISONE 2.5 % EX CREA
TOPICAL_CREAM | CUTANEOUS | 11 refills | Status: DC
Start: 2022-10-21 — End: 2023-11-17

## 2022-10-21 MED ORDER — TERBINAFINE HCL 1 % EX CREA
TOPICAL_CREAM | CUTANEOUS | 11 refills | Status: DC
Start: 2022-10-21 — End: 2022-12-01

## 2022-10-21 MED ORDER — DUPILUMAB 300 MG/2ML ~~LOC~~ SOAJ
300.0000 mg | SUBCUTANEOUS | Status: AC
Start: 2022-11-04 — End: 2022-11-04
  Administered 2022-11-04: 300 mg via SUBCUTANEOUS

## 2022-10-21 MED ORDER — CLOBETASOL PROPIONATE 0.05 % EX CREA
TOPICAL_CREAM | CUTANEOUS | 2 refills | Status: DC
Start: 2022-10-21 — End: 2022-11-29

## 2022-10-21 NOTE — Patient Instructions (Addendum)
For itchy scalp  Start clobetasol solution - apply topically to itchy areas of scalp twice daily as needed for eczema.  Avoid applying to face, groin, and axilla. Use as directed. Long-term use can cause thinning of the skin. Topical steroids (such as triamcinolone, fluocinolone, fluocinonide, mometasone, clobetasol, halobetasol, betamethasone, hydrocortisone) can cause thinning and lightening of the skin if they are used for too long in the same area. Your physician has selected the right strength medicine for your problem and area affected on the body. Please use your medication only as directed by your physician to prevent side effects.   For itchy areas at arms and hands  Start clobetasol cream - apply topically to rash areas twice daily as needed for flares. Avoid applying to face, groin, and axilla. Use as directed. Long-term use can cause thinning of the skin.  Topical steroids (such as triamcinolone, fluocinolone, fluocinonide, mometasone, clobetasol, halobetasol, betamethasone, hydrocortisone) can cause thinning and lightening of the skin if they are used for too long in the same area. Your physician has selected the right strength medicine for your problem and area affected on the body. Please use your medication only as directed by your physician to prevent side effects.   For itchy red eye area  Start Hydrocortisone 2.5 % cream - apply topically to itchy rash around eyes twice daily as needed.   Topical steroids (such as triamcinolone, fluocinolone, fluocinonide, mometasone, clobetasol, halobetasol, betamethasone, hydrocortisone) can cause thinning and lightening of the skin if they are used for too long in the same area. Your physician has selected the right strength medicine for your problem and area affected on the body. Please use your medication only as directed by your physician to prevent side effects.     Dupilumab (Dupixent) is a treatment given by injection for adults and  children with moderate-to-severe atopic dermatitis. Goal is control of skin condition, not cure. It is given as 2 injections at the first dose followed by 1 injection ever 2 weeks thereafter.  Young children are dosed monthly.  Potential side effects include allergic reaction, herpes infections, injection site reactions and conjunctivitis (inflammation of the eyes).  The use of Dupixent requires long term medication management, including periodic office visits.   For foot fungus  Start terbinafine cream - apply cream to entire foot (both feet) and toes twice daily.       Gentle Skin Care Guide  1. Bathe no more than once a day.  2. Avoid bathing in hot water  3. Use a mild soap like Dove, Vanicream, Cetaphil, CeraVe. Can use Lever 2000 or Cetaphil antibacterial soap  4. Use soap only where you need it. On most days, use it under your arms, between your legs, and on your feet. Let the water rinse other areas unless visibly dirty.  5. When you get out of the bath/shower, use a towel to gently blot your skin dry, don't rub it.  6. While your skin is still a little damp, apply a moisturizing cream such as Vanicream, CeraVe, Cetaphil, Eucerin, Sarna lotion or plain Vaseline Jelly. For hands apply Neutrogena [place] Hand Cream or Excipial Hand Cream.  7. Reapply moisturizer any time you start to itch or feel dry.  8. Sometimes using free and clear laundry detergents can be helpful. Fabric softener sheets should be avoided. Downy Free & Gentle liquid, or any liquid fabric softener that is free of dyes and perfumes, it acceptable to use  9. If your doctor has given you prescription  creams you may apply moisturizers over them       Due to recent changes in healthcare laws, you may see results of your pathology and/or laboratory studies on MyChart before the doctors have had a chance to review them. We understand that in some cases there may be results that are confusing or concerning to  you. Please understand that not all results are received at the same time and often the doctors may need to interpret multiple results in order to provide you with the best plan of care or course of treatment. Therefore, we ask that you please give [place] 2 business days to thoroughly review all your results before contacting the office for clarification. Should we see a critical lab result, you will be contacted sooner.   If You Need Anything After Your Visit  If you have any questions or concerns for your doctor, please call our main line at [phone] and press option 4 to reach your doctor's medical assistant. If no one answers, please leave a voicemail as directed and we will return your call as soon as possible. Messages left after 4 pm will be answered the following business day.   You may also send [place] a message via MyChart. We typically respond to MyChart messages within 1-2 business days.  For prescription refills, please ask your pharmacy to contact our office. Our fax number is [phone].  If you have an urgent issue when the clinic is closed that cannot wait until the next business day, you can page your doctor at the number below.    Please note that while we do our best to be available for urgent issues outside of office hours, we are not available 24/7.   If you have an urgent issue and are unable to reach [place], you may choose to seek medical care at your doctor's office, retail clinic, urgent care center, or emergency room.  If you have a medical emergency, please immediately call 911 or go to the emergency department.  Pager Numbers  - Dr. Gwen Pounds: [phone]  - Dr. Roseanne Reno: [phone]  - Dr. Katrinka Blazing: [phone]   In the event of inclement weather, please call our main line at [phone] for an update on the status of any delays or closures.  Dermatology Medication Tips: Please keep the boxes that topical medications come in in order to help keep track of the  instructions about where and how to use these. Pharmacies typically print the medication instructions only on the boxes and not directly on the medication tubes.   If your medication is too expensive, please contact our office at [phone] option 4 or send [place] a message through MyChart.   We are unable to tell what your co-pay for medications will be in advance as this is different depending on your insurance coverage. However, we may be able to find a substitute medication at lower cost or fill out paperwork to get insurance to cover a needed medication.   If a prior authorization is required to get your medication covered by your insurance company, please allow [place] 1-2 business days to complete this process.  Drug prices often vary depending on where the prescription is filled and some pharmacies may offer cheaper prices.  The website www.goodrx.com contains coupons for medications through different pharmacies. The prices here do not account for what the cost may be with help from insurance (it may be cheaper with your insurance), but the website can give you the price if you did not  use any insurance.  - You can print the associated coupon and take it with your prescription to the pharmacy.  - You may also stop by our office during regular business hours and pick up a GoodRx coupon card.  - If you need your prescription sent electronically to a different pharmacy, notify our office through [facility] or by phone at [phone] option 4.     Si Usted Necesita Algo Despus de Su Visita  Tambin puede enviarnos un mensaje a travs de Clinical cytogeneticist. Por lo general respondemos a los mensajes de MyChart en el transcurso de 1 a 2 das hbiles.  Para renovar recetas, por favor pida a su farmacia que se ponga en contacto con nuestra oficina. Annie Sable de fax es [place] [phone].  Si tiene un asunto urgente cuando la clnica est cerrada y que no puede esperar hasta el siguiente da hbil,  puede llamar/localizar a su doctor(a) al nmero que aparece a continuacin.   Por favor, tenga en cuenta que aunque hacemos todo lo posible para estar disponibles para asuntos urgentes fuera del horario de [place], no estamos disponibles las 24 horas del da, los 7 [address]  [address] de la [place].   Si tiene un problema urgente y no puede comunicarse con nosotros, puede optar por buscar atencin mdica  en el consultorio de su doctor(a), en una clnica privada, en un centro de atencin urgente o en una sala de emergencias.  Si tiene Engineer, drilling, por favor llame inmediatamente al 911 o vaya a la sala de emergencias.  Nmeros de bper  - Dr. Gwen Pounds: [phone]  - Dra. Roseanne Reno: [phone]  - Dr. Katrinka Blazing: [phone]   En caso de inclemencias del tiempo, por favor llame a Lacy Duverney principal al [phone] para una actualizacin sobre el [place] de cualquier retraso o cierre.  Consejos para la medicacin en dermatologa: Por favor, guarde las cajas en las que vienen los medicamentos de uso tpico para ayudarle a seguir las instrucciones sobre dnde y cmo usarlos. Las farmacias generalmente imprimen las instrucciones del medicamento slo en las cajas y no directamente en los tubos del [place].   Si su medicamento es muy caro, por favor, pngase en contacto con Rolm [place]ala llamando al [phone] y presione la opcin 4 o envenos un mensaje a travs de Clinical cytogeneticist.   No podemos decirle cul ser su copago por los medicamentos por adelantado ya que esto es diferente dependiendo de la cobertura de su seguro. Sin embargo, es posible que podamos encontrar un medicamento sustituto a Audiological scientist un formulario para que el seguro cubra el medicamento que se considera necesario.   Si se requiere una autorizacin previa para que su compaa de seguros [place] su medicamento, por favor permtanos de 1 a 2 das hbiles para completar [address]  Los precios de los medicamentos varan  con frecuencia dependiendo del Environmental consultant de dnde se surte la receta y alguna farmacias pueden ofrecer precios ms baratos.  El sitio web www.goodrx.com tiene cupones para medicamentos de Health and safety inspector. Los precios aqu no tienen en cuenta lo que podra costar con la ayuda del seguro (puede ser ms barato con su seguro), pero el sitio web puede darle el precio si no utiliz Tourist information centre manager.  - Puede imprimir el cupn correspondiente y llevarlo con su receta a la farmacia.  - Tambin puede pasar por nuestra oficina durante el horario de atencin regular y Education officer, museum una tarjeta de cupones de GoodRx.  - Si necesita que su receta se enve  electrnicamente a [place] diferente, informe a nuestra oficina a travs de MyChart de [place] o por telfono llamando al [phone] y presione la opcin 4.

## 2022-10-21 NOTE — Progress Notes (Signed)
Follow-Up Visit   Subjective  Suzanne Snyder is a 72 y.o. female who presents for the following: started with left eye a week ago with red itchy . Patient reports very red, itchy, and fluid. Patient also reports itchy arms that are swollen and itchy areas on scalp. Also reports history of foot fungus and would like treatment.    The patient has spots, moles and lesions to be evaluated, some may be new or changing and the patient may have concern these could be cancer.   The following portions of the chart were reviewed this encounter and updated as appropriate: medications, allergies, medical history  Review of Systems:  No other skin or systemic complaints except as noted in HPI or Assessment and Plan.  Objective  Well appearing patient in no apparent distress; mood and affect are within normal limits.  A focused examination was performed of the following areas: Face, scalp, arms, hands,  b/l eyes, b/l feet and toes   Relevant exam findings are noted in the Assessment and Plan.    Assessment & Plan   SEBORRHEIC KERATOSIS Left posterior thigh  - Stuck-on, waxy, tan-brown papules and/or plaques  - Benign-appearing - Discussed benign etiology and prognosis. - Observe - Call for any changes   TINEA PEDIS and onychomycosis at b/l feet  Exam: erythematous scaly patches of plantar feet. Yellow discoloration and thickening of dystrophic toenails.  Chronic and persistent condition with duration or expected duration over one year. Condition is bothersome/symptomatic for patient. Currently flared.  Treatment Plan: Start terbinafine cream - apply topically bid to b/l feet and toes   ATOPIC DERMATITIS Exam: Scaly pink lichenified fissured plaques confluent on bilateral palms, dorsal fingers, distal forearms, periorbital bilaterally Scattered white scaling of scalp 5% BSA  Chronic and persistent condition with duration or expected duration over one year. Condition is  bothersome/symptomatic for patient. Currently flared.  No clear triggers from history for allergic contact dermatitis  Atopic dermatitis (eczema) is a chronic, relapsing, pruritic condition that can significantly affect quality of life. It is often associated with allergic rhinitis and/or asthma and can require treatment with topical medications, phototherapy, or in severe cases biologic injectable medication (Dupixent; Adbry) or Oral JAK inhibitors.  Treatment Plan:  Start hydrocortisone 2.5 % cream - apply twice daily to itchy areas around eyes as needed for flares.   Start clobetasol 0.05 % solution - apply topically to itchy areas of scalp twice daily as needed for flares.   Start clobetasol 0.05 % cream - apply topically to itchy areas on arms and hands twice daily as needed for flares  Avoid applying to face, groin, and axilla. Use as directed. Long-term use can cause thinning of the skin.   Topical steroids (such as triamcinolone, fluocinolone, fluocinonide, mometasone, clobetasol, halobetasol, betamethasone, hydrocortisone) can cause thinning and lightening of the skin if they are used for too long in the same area. Your physician has selected the right strength medicine for your problem and area affected on the body. Please use your medication only as directed by your physician to prevent side effects.    Recommend Dupixent 600 mg today for loading dose Sample of Dupixent 300 mg /2 ml injected into right abdominal tissue and Dupixent 300 mg/2 ml injected into left abdominal tissue. Patient tolerated well with no adverse reactions.   NDC # [id number] Exp 08/2024  Patient will return in 2 weeks for another injection. Patient is interested in injecting herself every 2 weeks if medication  is prescribed. Patient would like to be educated on self injecting.   Dupilumab (Dupixent) is a treatment given by injection for adults and children with moderate-to-severe atopic  dermatitis. Goal is control of skin condition, not cure. It is given as 2 injections at the first dose followed by 1 injection ever 2 weeks thereafter.  Young children are dosed monthly.  Potential side effects include allergic reaction, herpes infections, injection site reactions and conjunctivitis (inflammation of the eyes).  The use of Dupixent requires long term medication management, including periodic office visits.   Recommend gentle skin care.   Other atopic dermatitis  Related Medications hydrocortisone 2.5 % cream Apply to itchy rash around eyes twice daily until clear.  clobetasol (TEMOVATE) 0.05 % external solution Apply topically to itchy areas of scalp twice daily as needed for flares. Avoid applying to face, groin, and axilla. Use as directed.  clobetasol cream (TEMOVATE) 0.05 % Apply topically to arms and hands twice daily as needed for eczema flares. Avoid applying to face, groin, and axilla. Use as directed.  dupilumab (DUPIXENT) prefilled syringe 600 mg   Dupilumab SOPN 300 mg   Tinea pedis of both feet  Related Medications terbinafine (LAMISIL) 1 % cream Apply topically to feet twice daily for tinea pedis    Return for 2  week nurse visit dupixent injection, 4 week atopic derm follow up and injection.  I, Asher Muir, CMA, am acting as scribe for Elie Goody, MD.   Documentation: I have reviewed the above documentation for accuracy and completeness, and I agree with the above.  Elie Goody, MD

## 2022-11-04 ENCOUNTER — Ambulatory Visit (INDEPENDENT_AMBULATORY_CARE_PROVIDER_SITE_OTHER): Payer: Medicare Other

## 2022-11-04 DIAGNOSIS — L209 Atopic dermatitis, unspecified: Secondary | ICD-10-CM

## 2022-11-04 NOTE — Progress Notes (Signed)
Patient here today for Dupixent injection for severe atopic dermatitis.    Dupixent Pen 300mg  injected into left upper arm. Patient tolerated well. LOT: [id number]  EXP: 05/11/2024 NDC: 0454-0981-19   Dorathy Daft, RMA

## 2022-11-19 ENCOUNTER — Encounter
Admission: EM | Disposition: A | Discharge status: Home Health | Payer: Self-pay | Source: Home / Self Care | Attending: Internal Medicine

## 2022-11-19 ENCOUNTER — Emergency Department: Payer: Medicare Other

## 2022-11-19 ENCOUNTER — Other Ambulatory Visit: Payer: Self-pay

## 2022-11-19 ENCOUNTER — Encounter: Payer: Self-pay | Admitting: Anesthesiology

## 2022-11-19 ENCOUNTER — Encounter (HOSPITAL_COMMUNITY): Payer: Self-pay

## 2022-11-19 ENCOUNTER — Inpatient Hospital Stay: Payer: Medicare Other

## 2022-11-19 ENCOUNTER — Ambulatory Visit: Payer: Medicare Other | Admitting: Dermatology

## 2022-11-19 ENCOUNTER — Inpatient Hospital Stay (HOSPITAL_COMMUNITY): Admit: 2022-11-19 | Payer: Medicare Other | Admitting: Internal Medicine

## 2022-11-19 ENCOUNTER — Inpatient Hospital Stay
Admission: EM | Admit: 2022-11-19 | Discharge: 2022-11-26 | DRG: 579 | Disposition: A | Discharge status: Home Health | Payer: Medicare Other | Attending: Internal Medicine | Admitting: Internal Medicine

## 2022-11-19 DIAGNOSIS — G8929 Other chronic pain: Secondary | ICD-10-CM | POA: Diagnosis present

## 2022-11-19 DIAGNOSIS — Z79899 Other long term (current) drug therapy: Secondary | ICD-10-CM

## 2022-11-19 DIAGNOSIS — I81 Portal vein thrombosis: Secondary | ICD-10-CM | POA: Diagnosis present

## 2022-11-19 DIAGNOSIS — M65142 Other infective (teno)synovitis, left hand: Secondary | ICD-10-CM | POA: Diagnosis present

## 2022-11-19 DIAGNOSIS — G9341 Metabolic encephalopathy: Secondary | ICD-10-CM | POA: Diagnosis present

## 2022-11-19 DIAGNOSIS — Z9109 Other allergy status, other than to drugs and biological substances: Secondary | ICD-10-CM

## 2022-11-19 DIAGNOSIS — W06XXXA Fall from bed, initial encounter: Secondary | ICD-10-CM | POA: Diagnosis present

## 2022-11-19 DIAGNOSIS — F17210 Nicotine dependence, cigarettes, uncomplicated: Secondary | ICD-10-CM | POA: Diagnosis present

## 2022-11-19 DIAGNOSIS — Z881 Allergy status to other antibiotic agents status: Secondary | ICD-10-CM | POA: Diagnosis not present

## 2022-11-19 DIAGNOSIS — G9340 Encephalopathy, unspecified: Secondary | ICD-10-CM | POA: Diagnosis not present

## 2022-11-19 DIAGNOSIS — M25432 Effusion, left wrist: Secondary | ICD-10-CM

## 2022-11-19 DIAGNOSIS — W19XXXA Unspecified fall, initial encounter: Secondary | ICD-10-CM

## 2022-11-19 DIAGNOSIS — L03114 Cellulitis of left upper limb: Secondary | ICD-10-CM | POA: Diagnosis present

## 2022-11-19 DIAGNOSIS — M549 Dorsalgia, unspecified: Secondary | ICD-10-CM | POA: Diagnosis present

## 2022-11-19 DIAGNOSIS — B353 Tinea pedis: Secondary | ICD-10-CM | POA: Diagnosis present

## 2022-11-19 DIAGNOSIS — M810 Age-related osteoporosis without current pathological fracture: Secondary | ICD-10-CM | POA: Diagnosis present

## 2022-11-19 DIAGNOSIS — M542 Cervicalgia: Secondary | ICD-10-CM | POA: Diagnosis present

## 2022-11-19 DIAGNOSIS — E1042 Type 1 diabetes mellitus with diabetic polyneuropathy: Secondary | ICD-10-CM | POA: Diagnosis present

## 2022-11-19 DIAGNOSIS — M659 Unspecified synovitis and tenosynovitis, unspecified site: Secondary | ICD-10-CM

## 2022-11-19 DIAGNOSIS — M1990 Unspecified osteoarthritis, unspecified site: Secondary | ICD-10-CM | POA: Diagnosis present

## 2022-11-19 DIAGNOSIS — E1065 Type 1 diabetes mellitus with hyperglycemia: Secondary | ICD-10-CM | POA: Diagnosis not present

## 2022-11-19 DIAGNOSIS — Z888 Allergy status to other drugs, medicaments and biological substances status: Secondary | ICD-10-CM

## 2022-11-19 DIAGNOSIS — M25532 Pain in left wrist: Secondary | ICD-10-CM | POA: Diagnosis present

## 2022-11-19 DIAGNOSIS — I871 Compression of vein: Secondary | ICD-10-CM | POA: Diagnosis present

## 2022-11-19 DIAGNOSIS — Y92003 Bedroom of unspecified non-institutional (private) residence as the place of occurrence of the external cause: Secondary | ICD-10-CM | POA: Diagnosis not present

## 2022-11-19 DIAGNOSIS — Z9641 Presence of insulin pump (external) (internal): Secondary | ICD-10-CM | POA: Diagnosis present

## 2022-11-19 DIAGNOSIS — Z7951 Long term (current) use of inhaled steroids: Secondary | ICD-10-CM

## 2022-11-19 DIAGNOSIS — Z794 Long term (current) use of insulin: Secondary | ICD-10-CM | POA: Diagnosis not present

## 2022-11-19 DIAGNOSIS — E10649 Type 1 diabetes mellitus with hypoglycemia without coma: Secondary | ICD-10-CM | POA: Diagnosis present

## 2022-11-19 DIAGNOSIS — M62838 Other muscle spasm: Secondary | ICD-10-CM | POA: Diagnosis not present

## 2022-11-19 DIAGNOSIS — Z791 Long term (current) use of non-steroidal anti-inflammatories (NSAID): Secondary | ICD-10-CM

## 2022-11-19 DIAGNOSIS — G2581 Restless legs syndrome: Secondary | ICD-10-CM | POA: Diagnosis present

## 2022-11-19 DIAGNOSIS — E162 Hypoglycemia, unspecified: Secondary | ICD-10-CM | POA: Diagnosis not present

## 2022-11-19 DIAGNOSIS — J45909 Unspecified asthma, uncomplicated: Secondary | ICD-10-CM | POA: Diagnosis present

## 2022-11-19 DIAGNOSIS — R531 Weakness: Secondary | ICD-10-CM | POA: Diagnosis present

## 2022-11-19 DIAGNOSIS — F419 Anxiety disorder, unspecified: Secondary | ICD-10-CM | POA: Diagnosis present

## 2022-11-19 DIAGNOSIS — G5602 Carpal tunnel syndrome, left upper limb: Secondary | ICD-10-CM | POA: Diagnosis present

## 2022-11-19 DIAGNOSIS — Z7901 Long term (current) use of anticoagulants: Secondary | ICD-10-CM | POA: Diagnosis not present

## 2022-11-19 DIAGNOSIS — L03113 Cellulitis of right upper limb: Secondary | ICD-10-CM

## 2022-11-19 DIAGNOSIS — L02512 Cutaneous abscess of left hand: Secondary | ICD-10-CM | POA: Diagnosis present

## 2022-11-19 LAB — COMPREHENSIVE METABOLIC PANEL
ALT: 20 U/L (ref 0–44)
AST: 22 U/L (ref 15–41)
Albumin: 3.8 g/dL (ref 3.5–5.0)
Alkaline Phosphatase: 58 U/L (ref 38–126)
Anion gap: 7 (ref 5–15)
BUN: 34 mg/dL — ABNORMAL HIGH (ref 8–23)
CO2: 24 mmol/L (ref 22–32)
Calcium: 8.8 mg/dL — ABNORMAL LOW (ref 8.9–10.3)
Chloride: 100 mmol/L (ref 98–111)
Creatinine, Ser: 0.64 mg/dL (ref 0.44–1.00)
GFR, Estimated: >60 mL/min (ref >60)
Glucose, Bld: 36 mg/dL — CL (ref 70–99)
Potassium: 3.9 mmol/L (ref 3.5–5.1)
Sodium: 131 mmol/L — ABNORMAL LOW (ref 135–145)
Total Bilirubin: 0.4 mg/dL (ref <1.2)
Total Protein: 6.9 g/dL (ref 6.5–8.1)

## 2022-11-19 LAB — GLUCOSE, CAPILLARY
Glucose-Capillary: 184 mg/dL — ABNORMAL HIGH (ref 70–99)
Glucose-Capillary: 145 mg/dL — ABNORMAL HIGH (ref 70–99)

## 2022-11-19 LAB — CBC WITH DIFFERENTIAL/PLATELET
Abs Immature Granulocytes: 0.02 10*3/uL (ref 0.00–0.07)
Basophils Absolute: 0.1 10*3/uL (ref 0.0–0.1)
Basophils Relative: 1 %
Eosinophils Absolute: 0.3 10*3/uL (ref 0.0–0.5)
Eosinophils Relative: 3 %
HCT: 37.3 % (ref 36.0–46.0)
Hemoglobin: 12.2 g/dL (ref 12.0–15.0)
Immature Granulocytes: 0 %
Lymphocytes Relative: 20 %
Lymphs Abs: 2 10*3/uL (ref 0.7–4.0)
MCH: 28.8 pg (ref 26.0–34.0)
MCHC: 32.7 g/dL (ref 30.0–36.0)
MCV: 88.2 fL (ref 80.0–100.0)
Monocytes Absolute: 1.1 10*3/uL — ABNORMAL HIGH (ref 0.1–1.0)
Monocytes Relative: 11 %
Neutro Abs: 6.5 10*3/uL (ref 1.7–7.7)
Neutrophils Relative %: 65 %
Platelets: 225 10*3/uL (ref 150–400)
RBC: 4.23 MIL/uL (ref 3.87–5.11)
RDW: 15.1 % (ref 11.5–15.5)
WBC: 10 10*3/uL (ref 4.0–10.5)
nRBC: 0 % (ref 0.0–0.2)

## 2022-11-19 LAB — CBG MONITORING, ED
Glucose-Capillary: 36 mg/dL — CL (ref 70–99)
Glucose-Capillary: 123 mg/dL — ABNORMAL HIGH (ref 70–99)
Glucose-Capillary: 75 mg/dL (ref 70–99)
Glucose-Capillary: 86 mg/dL (ref 70–99)
Glucose-Capillary: 87 mg/dL (ref 70–99)
Glucose-Capillary: 101 mg/dL — ABNORMAL HIGH (ref 70–99)
Glucose-Capillary: 226 mg/dL — ABNORMAL HIGH (ref 70–99)
Glucose-Capillary: 218 mg/dL — ABNORMAL HIGH (ref 70–99)

## 2022-11-19 LAB — BLOOD GAS, VENOUS
Acid-Base Excess: 3.9 mmol/L — ABNORMAL HIGH (ref 0.0–2.0)
Bicarbonate: 27.7 mmol/L (ref 20.0–28.0)
O2 Saturation: 85.9 %
Patient temperature: 37
pCO2, Ven: 38 mm[Hg] — ABNORMAL LOW (ref 44–60)
pH, Ven: 7.47 — ABNORMAL HIGH (ref 7.25–7.43)
pO2, Ven: 51 mm[Hg] — ABNORMAL HIGH (ref 32–45)

## 2022-11-19 LAB — AMMONIA: Ammonia: 32 umol/L (ref 9–35)

## 2022-11-19 LAB — HEMOGLOBIN A1C
Hgb A1c MFr Bld: 7.3 % — ABNORMAL HIGH (ref 4.8–5.6)
Mean Plasma Glucose: 162.81 mg/dL

## 2022-11-19 LAB — SEDIMENTATION RATE: Sed Rate: 6 mm/h (ref 0–30)

## 2022-11-19 LAB — HEPARIN LEVEL (UNFRACTIONATED): Heparin Unfractionated: <0.1 [IU]/mL — ABNORMAL LOW (ref 0.30–0.70)

## 2022-11-19 SURGERY — INCISION AND DRAINAGE OF DEEP ABSCESS, HAND
Anesthesia: Choice | Laterality: Left

## 2022-11-19 MED ORDER — IOHEXOL 300 MG/ML  SOLN
100.0000 mL | Freq: Once | INTRAMUSCULAR | Status: AC | PRN
  Administered 2022-11-19: 100 mL via INTRAVENOUS

## 2022-11-19 MED ORDER — ALBUTEROL SULFATE (2.5 MG/3ML) 0.083% IN NEBU
2.5000 mg | INHALATION_SOLUTION | RESPIRATORY_TRACT | Status: DC | PRN
  Administered 2022-11-20 – 2022-11-23 (×3): 2.5 mg via RESPIRATORY_TRACT
  Filled 2022-11-19 (×3): qty 3

## 2022-11-19 MED ORDER — INSULIN GLARGINE-YFGN 100 UNIT/ML ~~LOC~~ SOLN
6.0000 [IU] | Freq: Every day | SUBCUTANEOUS | Status: DC
  Administered 2022-11-19 – 2022-11-20 (×2): 6 [IU] via SUBCUTANEOUS
  Filled 2022-11-19 (×3): qty 0.06

## 2022-11-19 MED ORDER — VANCOMYCIN HCL 1250 MG/250ML IV SOLN
1250.0000 mg | Freq: Once | INTRAVENOUS | Status: AC
  Administered 2022-11-19: 1250 mg via INTRAVENOUS
  Filled 2022-11-19: qty 250

## 2022-11-19 MED ORDER — VANCOMYCIN HCL IN DEXTROSE 1-5 GM/200ML-% IV SOLN
1000.0000 mg | INTRAVENOUS | Status: DC
  Administered 2022-11-20 – 2022-11-24 (×5): 1000 mg via INTRAVENOUS
  Filled 2022-11-19 (×6): qty 200

## 2022-11-19 MED ORDER — DEXTROSE 50 % IV SOLN
INTRAVENOUS | Status: AC
  Administered 2022-11-19: 50 mL via INTRAVENOUS
  Filled 2022-11-19: qty 50

## 2022-11-19 MED ORDER — APIXABAN 5 MG PO TABS: 5.0000 mg | ORAL_TABLET | Freq: Two times a day (BID) | ORAL | Status: DC

## 2022-11-19 MED ORDER — DEXTROSE 50 % IV SOLN: 1.0000 | Freq: Once | INTRAVENOUS | Status: AC

## 2022-11-19 MED ORDER — DEXTROSE 50 % IV SOLN: 1.0000 | INTRAVENOUS | Status: DC | PRN

## 2022-11-19 MED ORDER — INSULIN ASPART 100 UNIT/ML IJ SOLN
0.0000 [IU] | INTRAMUSCULAR | Status: DC
  Administered 2022-11-19: 2 [IU] via SUBCUTANEOUS
  Administered 2022-11-19 – 2022-11-20 (×2): 1 [IU] via SUBCUTANEOUS
  Administered 2022-11-20: 2 [IU] via SUBCUTANEOUS
  Administered 2022-11-20: 1 [IU] via SUBCUTANEOUS
  Administered 2022-11-20 – 2022-11-21 (×4): 2 [IU] via SUBCUTANEOUS
  Filled 2022-11-19 (×8): qty 1

## 2022-11-19 MED ORDER — DEXTROSE 10 % IV SOLN: INTRAVENOUS | Status: DC

## 2022-11-19 MED ORDER — SODIUM CHLORIDE 0.9 % IV SOLN
2.0000 g | INTRAVENOUS | Status: AC
  Administered 2022-11-19 – 2022-11-25 (×7): 2 g via INTRAVENOUS
  Filled 2022-11-19 (×7): qty 20

## 2022-11-19 SURGICAL SUPPLY — 29 items
APL PRP STRL LF DISP 70% ISPRP (MISCELLANEOUS) ×2
BNDG CMPR 5X2 KNTD ELC UNQ LF (GAUZE/BANDAGES/DRESSINGS) ×1
BNDG CMPR 5X4 CHSV STRCH STRL (GAUZE/BANDAGES/DRESSINGS) ×1
BNDG COHESIVE 4X5 TAN STRL LF (GAUZE/BANDAGES/DRESSINGS) ×1 IMPLANT
BNDG ELASTIC 2INX 5YD STR LF (GAUZE/BANDAGES/DRESSINGS) ×1 IMPLANT
BNDG ESMARCH 4 X 12 STRL LF (GAUZE/BANDAGES/DRESSINGS) ×1
BNDG ESMARCH 4X12 STRL LF (GAUZE/BANDAGES/DRESSINGS) ×1 IMPLANT
CHLORAPREP W/TINT 26 (MISCELLANEOUS) ×2 IMPLANT
CORD BIP STRL DISP 12FT (MISCELLANEOUS) ×1 IMPLANT
CUFF TOURN SGL QUICK 18X4 (TOURNIQUET CUFF) IMPLANT
FORCEPS JEWEL BIP 4-3/4 STR (INSTRUMENTS) ×1 IMPLANT
GAUZE SPONGE 4X4 12PLY STRL (GAUZE/BANDAGES/DRESSINGS) ×1 IMPLANT
GAUZE XEROFORM 1X8 LF (GAUZE/BANDAGES/DRESSINGS) ×1 IMPLANT
GLOVE BIO SURGEON STRL SZ8 (GLOVE) ×1 IMPLANT
GLOVE INDICATOR 8.0 STRL GRN (GLOVE) ×1 IMPLANT
GOWN STRL REUS W/ TWL XL LVL3 (GOWN DISPOSABLE) ×1 IMPLANT
GOWN STRL REUS W/TWL XL LVL3 (GOWN DISPOSABLE) ×1
KIT TURNOVER KIT A (KITS) ×1 IMPLANT
MANIFOLD NEPTUNE II (INSTRUMENTS) ×1 IMPLANT
NS IRRIG 500ML POUR BTL (IV SOLUTION) ×1 IMPLANT
PACK EXTREMITY ARMC (MISCELLANEOUS) ×1 IMPLANT
SPLINT WRIST LG LT TX990309 (SOFTGOODS) IMPLANT
SPLINT WRIST LG RT TX900304 (SOFTGOODS) IMPLANT
SPLINT WRIST M LT TX990308 (SOFTGOODS) IMPLANT
SPLINT WRIST M RT TX990303 (SOFTGOODS) IMPLANT
STOCKINETTE IMPERVIOUS 9X36 MD (GAUZE/BANDAGES/DRESSINGS) ×1 IMPLANT
SUT PROLENE 4 0 PS 2 18 (SUTURE) ×1 IMPLANT
TRAP FLUID SMOKE EVACUATOR (MISCELLANEOUS) ×1 IMPLANT
WATER STERILE IRR 500ML POUR (IV SOLUTION) ×1 IMPLANT

## 2022-11-19 NOTE — ED Notes (Signed)
RN, 2 lab techs unable to collect blood cultures. Will notify MD

## 2022-11-19 NOTE — ED Notes (Signed)
OT with pt

## 2022-11-19 NOTE — Assessment & Plan Note (Addendum)
Left wrist redness and swelling status post fall today Patient does report left wrist pain and swelling prior to fall with?  Subjective chills Plain films negative for occult fracture  Will start empiric cellulitis coverage Given wrist swelling and redness, will check MRI of the wrist to rule out any joint involvement Pain control Otherwise monitor closely

## 2022-11-19 NOTE — ED Notes (Signed)
Pt given apple juice, assisted to restroom

## 2022-11-19 NOTE — Assessment & Plan Note (Signed)
+   fall at home w/ L wrist trauma in setting of blood sugar in 30s  Suspect symptomatic hypoglycemia  Baseline general weakness confounding issue  CT head WNL  Fall precautions  PT/OT evaluation  Follow  Anticipate placement

## 2022-11-19 NOTE — Progress Notes (Signed)
PT Cancellation Note  Patient Details Name: Suzanne Snyder MRN: [id number] DOB: 11/06/1950   Cancelled Treatment:    Reason Eval/Treat Not Completed: Other (comment). Attempted PT eval but Pt lethargic and unable to sustain level of wakefulness despite painful stimuli. Notified RN of Pt's current status with concerns of possible infection. Will attempt to perform PT evaluation at a later time.   Toddrick Sanna 11/19/2022, 3:50 PM

## 2022-11-19 NOTE — ED Notes (Addendum)
Pt resting at this time.

## 2022-11-19 NOTE — ED Notes (Signed)
Lab contacted to collect cultures

## 2022-11-19 NOTE — ED Notes (Signed)
IV access currently being attempted. Patient being provided apple juice to drink.

## 2022-11-19 NOTE — ED Notes (Signed)
CBG in Triage 36

## 2022-11-19 NOTE — H&P (Addendum)
History and Physical    Patient: Suzanne Snyder WUJ:[id number] DOB: 12/05/1950 DOA: 11/19/2022 DOS: the patient was seen and examined on 11/19/2022 PCP: Gracelyn Nurse, MD  Patient coming from: Home  Chief Complaint:  Chief Complaint  Patient presents with   Hand Pain   HPI: Suzanne Snyder is a 72 y.o. female with medical history significant of type 1 diabetes, asthma, tobacco abuse, neuropathy, chronic pain, portal vein thrombosis on eliquis presenting w/ fall, L wrist redness and swelling, encephalopathy, hypoglycemia.  Limited history in the setting of encephalopathy.  Per report, patient with fall at home.  Baseline history of type 1 diabetes.  Blood sugars into the 30s when noted evaluation by EMS.  Insulin pump in place.  Patient was secondary fall on the left wrist with worsening pain and swelling.  Patient does report?  Pain and swelling of the wrist prior to event.  Somewhat unclear.  Was brought to the ER by her daughter this morning.  No active chest pain or shortness of breath.?  Chills at home.  No abdominal pain.  Unclear if patient is still taking Eliquis in the setting of diagnosis of portal vein thrombosis April of this year. Presented to the ER afebrile, hemodynamically stable.  Satting well on room air.  Hemodynamically stable.  White count 10, hemoglobin 12.2, platelets 225.  Blood sugar 30s on presentation.  CT head and CT C-spine grossly within norm limits.  Left wrist plain films with soft tissue swelling and no bony involvement. Review of Systems: As mentioned in the history of present illness. All other systems reviewed and are negative. Past Medical History:  Diagnosis Date   Anxiety    Arthritis    Asthma    Diabetes mellitus without complication (HCC)    Insulin pump in place    Osteoporosis    RLS (restless legs syndrome)    Smokers' cough (HCC)    Wears dentures    full upper and lower   Past Surgical History:  Procedure Laterality Date   CATARACT  EXTRACTION W/PHACO Right 02/20/2020   Procedure: CATARACT EXTRACTION PHACO AND INTRAOCULAR LENS PLACEMENT (IOC) RIGHT 5.60 00:36.3;  Surgeon: Galen Manila, MD;  Location: [facility];  Service: Ophthalmology;  Laterality: Right;  Diabetic - insulin pump Requests not first   CATARACT EXTRACTION W/PHACO Left 03/05/2020   Procedure: CATARACT EXTRACTION PHACO AND INTRAOCULAR LENS PLACEMENT (IOC) LEFT 4.94 00:31.6;  Surgeon: Galen Manila, MD;  Location: [facility];  Service: Ophthalmology;  Laterality: Left;   HIP PINNING,CANNULATED Right 02/09/2022   Procedure: PERCUTANEOUS FIXATION OF FEMORAL NECK;  Surgeon: Deeann Saint, MD;  Location: [facility];  Service: Orthopedics;  Laterality: Right;   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   ROTATOR CUFF REPAIR Left 2014 and 2015   SPINE SURGERY N/A 1984   L4-5, not sure of procedure   THYROID SURGERY     Social History:  reports that she has been smoking cigarettes. She has a 50 pack-year smoking history. She has never used smokeless tobacco. She reports that she does not drink alcohol and does not use drugs.  Allergies  Allergen Reactions   Cat Hair Extract Shortness Of Breath   Pregabalin Anaphylaxis   Erythromycin Other (See Comments)    Severe stomach upset    Family History  Problem Relation Age of Onset   Parkinsonism Father     Prior to Admission medications   Medication Sig Start Date End Date Taking? Authorizing Provider  acetaminophen (TYLENOL) 325 MG tablet Take 2 tablets (650 mg total) by mouth every 6 (six) hours as needed for mild pain, moderate pain, fever or headache (or Fever >/= 101). 02/17/22  Yes Gillis Santa, MD  acyclovir (ZOVIRAX) 400 MG tablet Take 400 mg by mouth 5 (five) times daily.   Yes [provider]  albuterol (PROVENTIL) (2.5 MG/3ML) 0.083% nebulizer solution Take 2.5 mg by nebulization every 4 (four) hours as needed for wheezing or shortness of breath.   Yes [provider]  albuterol (VENTOLIN HFA) 108 (90 Base) MCG/ACT inhaler Inhale into the lungs every 6 (six) hours as needed for wheezing or shortness of breath.   Yes [provider]  apixaban (ELIQUIS) 5 MG TABS tablet Take 1 tablet (5 mg total) by mouth 2 (two) times daily. 05/15/22  Yes Loyce Dys, MD  bisacodyl (DULCOLAX) 10 MG suppository Place 1 suppository (10 mg total) rectally daily as needed for severe constipation. 02/17/22  Yes Gillis Santa, MD  bisacodyl (DULCOLAX) 5 MG EC tablet Take 2 tablets (10 mg total) by mouth daily as needed for moderate constipation. 02/17/22  Yes Gillis Santa, MD  celecoxib (CELEBREX) 100 MG capsule Take 100 mg by mouth at bedtime. 05/15/22  Yes [provider]  clobetasol (TEMOVATE) 0.05 % external solution Apply topically to itchy areas of scalp twice daily as needed for flares. Avoid applying to face, groin, and axilla. Use as directed. 10/21/22  Yes Elie Goody, MD  clobetasol cream (TEMOVATE) 0.05 % Apply topically to arms and hands twice daily as needed for eczema flares. Avoid applying to face, groin, and axilla. Use as directed. 10/21/22  Yes Elie Goody, MD  clonazePAM (KLONOPIN) 0.5 MG tablet Take 1 tablet (0.5 mg total) by mouth 2 (two) times daily as needed for anxiety. 02/17/22  Yes Gillis Santa, MD  cyclobenzaprine (FLEXERIL) 10 MG tablet Take 10 mg by mouth 3 (three) times daily as needed for muscle spasms.   Yes [provider]  denosumab (PROLIA) 60 MG/ML SOSY injection Inject 60 mg into the skin every 6 (six) months.   Yes [provider]  furosemide (LASIX) 20 MG tablet Take 20 mg by mouth daily as needed for edema or fluid. 12/31/21  Yes [provider]  gabapentin (NEURONTIN) 300 MG capsule Take 900 mg by mouth 2 (two) times daily.   Yes [provider]  hydrocortisone 2.5 % cream Apply to itchy rash around eyes twice daily until clear. 10/21/22  Yes Elie Goody, MD  hydrOXYzine  (ATARAX) 25 MG tablet Take 50 mg by mouth 2 (two) times daily. 03/17/22  Yes [provider]  insulin aspart (NOVOLOG) 100 UNIT/ML injection Inject 40 Units into the skin daily. AS INSTRUCTED VIA OMNIPOD INSULIN PUMP 10/13/22  Yes [provider]  ipratropium (ATROVENT) 0.02 % nebulizer solution Take 0.5 mg by nebulization every 6 (six) hours as needed for wheezing or shortness of breath.   Yes [provider]  Multiple Vitamin (MULTIVITAMIN ADULT PO) Take by mouth 2 (two) times daily.   Yes [provider]  mupirocin ointment (BACTROBAN) 2 % Apply 1 Application topically daily as needed.   Yes [provider]  Omega-3 Fatty Acids (FISH OIL) 1000 MG CAPS Take 2 capsules by mouth daily. 08/04/19  Yes [provider]  polyethylene glycol (MIRALAX) 17 g packet Take 17 g by mouth daily. 02/11/22  Yes Wouk, Wilfred Curtis, MD  rOPINIRole (REQUIP) 1 MG tablet Take 1.5 mg by mouth  at bedtime.   Yes [provider]  terbinafine (LAMISIL) 1 % cream Apply topically to feet twice daily for tinea pedis 10/21/22  Yes Elie Goody, MD  Vitamin D, Ergocalciferol, (DRISDOL) 50000 UNITS CAPS capsule Take 150,000 Units by mouth 3 (three) times a week.   Yes [provider]  Monte Fantasia INHUB 500-50 MCG/ACT AEPB Inhale 1 puff into the lungs in the morning and at bedtime. 09/03/21  Yes [provider]  apixaban (ELIQUIS) 5 MG TABS tablet Take 2 tablets (10 mg total) by mouth 2 (two) times daily for 4 days. 05/11/22 08/25/22  Loyce Dys, MD  hydrOXYzine (ATARAX/VISTARIL) 10 MG tablet TAKE 1 OR 2 TABLET BY MOUTH THREE TIMES DAILY AS NEEDED FOR [facility] Patient not taking: Reported on 11/19/2022 03/19/20   Willeen Niece, MD  iron polysaccharides (NIFEREX) 150 MG capsule Take 1 capsule (150 mg total) by mouth daily. 02/17/22 08/25/22  Gillis Santa, MD  pantoprazole (PROTONIX) 40 MG tablet Take 1 tablet (40 mg total) by mouth daily. 05/06/22 08/25/22  Pilar Jarvis, MD  polyvinyl alcohol (LIQUIFILM TEARS) 1.4 % ophthalmic solution Place 1 drop into both eyes as needed for dry eyes. 02/17/22   Gillis Santa, MD  Saccharomyces boulardii (PROBIOTIC) 250 MG CAPS Take 1 capsule by mouth 3 times/day as needed-between meals & bedtime. Patient not taking: Reported on 11/19/2022 01/07/22   Willy Eddy, MD    Physical Exam: Vitals:   11/19/22 0205 11/19/22 0207 11/19/22 0430 11/19/22 0500  BP:  (!) 160/53 (!) 163/65 (!) 181/53  Pulse:  75 62 64  Resp:  16 14 14   Temp:  98.9 F (37.2 C)    TempSrc:  Oral    SpO2:  100% 94% 93%  Weight: 52.6 kg     Height: 5\' 8"  (1.727 m)      Physical Exam Constitutional:      Appearance: She is normal weight.  HENT:     Head: Normocephalic and atraumatic.     Nose: Nose normal.  Eyes:     Pupils: Pupils are equal, round, and reactive to light.  Cardiovascular:     Rate and Rhythm: Normal rate and regular rhythm.  Pulmonary:     Effort: Pulmonary effort is normal.  Abdominal:     General: Bowel sounds are normal.  Musculoskeletal:     Comments: + pain and decreased ROM in L wrist    Skin:    Comments: See picture    Neurological:     Comments: + generalized lethargy    Psychiatric:        Mood and Affect: Mood normal.     Data Reviewed:  There are no new results to review at this time.  US Venous Img Upper Left (DVT Study) CLINICAL DATA:  72 year old female with left side hand and wrist swelling. Pain and edema.  EXAM: LEFT UPPER EXTREMITY VENOUS DOPPLER ULTRASOUND  TECHNIQUE: Gray-scale sonography with graded compression, as well as color Doppler and duplex ultrasound were performed to evaluate the upper extremity deep venous system from the level of the subclavian vein and including the jugular, axillary, basilic, radial, ulnar and upper cephalic vein. Spectral Doppler was utilized to evaluate flow at rest and with distal augmentation maneuvers.  COMPARISON:  Left shoulder CT  04/30/2021. Previous right upper extremity venous Doppler 12/23/2020.  FINDINGS: Contralateral Subclavian Vein: Respiratory phasicity is normal and symmetric with the symptomatic side. No evidence of thrombus. Normal compressibility.  Internal Jugular Vein: No evidence of thrombus.  Normal compressibility, respiratory phasicity and response to augmentation.  Subclavian Vein: No evidence of thrombus. Normal compressibility, respiratory phasicity and response to augmentation.  Axillary Vein: No evidence of thrombus. Normal compressibility, respiratory phasicity and response to augmentation.  Cephalic Vein: No evidence of thrombus. Normal compressibility, respiratory phasicity and response to augmentation.  Basilic Vein: No evidence of thrombus. Normal compressibility, respiratory phasicity and response to augmentation.  Brachial Veins: No evidence of thrombus. Normal compressibility, respiratory phasicity and response to augmentation.  Radial Veins: No evidence of thrombus. Normal compressibility, respiratory phasicity and response to augmentation.  Ulnar Veins: No evidence of thrombus. Normal compressibility, respiratory phasicity and response to augmentation.  Venous Reflux:  None visualized.  Other Findings:  None visualized.  IMPRESSION: No evidence of DVT within the left upper extremity.  Electronically Signed   By: Odessa Fleming M.D.   On: 11/19/2022 04:54 CT Cervical Spine Wo Contrast CLINICAL DATA:  Neck trauma (Age >= 65y).  Fall  EXAM: CT CERVICAL SPINE WITHOUT CONTRAST  TECHNIQUE: Multidetector CT imaging of the cervical spine was performed without intravenous contrast. Multiplanar CT image reconstructions were also generated.  RADIATION DOSE REDUCTION: This exam was performed according to the departmental dose-optimization program which includes automated exposure control, adjustment of the mA and/or kV according to patient size and/or use of iterative  reconstruction technique.  COMPARISON:  12/02/2021  FINDINGS: Alignment: 3 mm of anterolisthesis of C3 on C4 and C7 on T1.  Skull base and vertebrae: No acute fracture. No primary bone lesion or focal pathologic process.  Soft tissues and spinal canal: No prevertebral fluid or swelling. No visible canal hematoma.  Disc levels: Diffuse advanced degenerative disc disease and moderate bilateral degenerative facet disease.  Upper chest: No acute findings  Other: None  IMPRESSION: Moderate to advanced degenerative disc and facet disease.  No acute bony abnormality.  Electronically Signed   By: Charlett Nose M.D.   On: 11/19/2022 03:03 CT Head Wo Contrast CLINICAL DATA:  Head trauma, minor (Age >= 65y), fall  EXAM: CT HEAD WITHOUT CONTRAST  TECHNIQUE: Contiguous axial images were obtained from the base of the skull through the vertex without intravenous contrast.  RADIATION DOSE REDUCTION: This exam was performed according to the departmental dose-optimization program which includes automated exposure control, adjustment of the mA and/or kV according to patient size and/or use of iterative reconstruction technique.  COMPARISON:  12/02/2021  FINDINGS: Brain: No acute intracranial abnormality. Specifically, no hemorrhage, hydrocephalus, mass lesion, acute infarction, or significant intracranial injury. Mild age related atrophy.  Vascular: No hyperdense vessel or unexpected calcification.  Skull: No acute calvarial abnormality.  Sinuses/Orbits: No acute findings  Other: None  IMPRESSION: No acute intracranial abnormality.  Electronically Signed   By: Charlett Nose M.D.   On: 11/19/2022 03:01 DG Wrist Complete Left CLINICAL DATA:  Fall, left wrist injury and swelling  EXAM: LEFT WRIST - COMPLETE 3+ VIEW  COMPARISON:  None Available.  FINDINGS: Normal alignment. No acute fracture or dislocation. Joint spaces are preserved. Degenerative chondrocalcinosis of  the triangular fibrocartilage complex. There is diffuse soft tissue swelling surrounding the left wrist.  IMPRESSION: 1. Diffuse soft tissue swelling. No acute fracture or dislocation.  Electronically Signed   By: Helyn Numbers M.D.   On: 11/19/2022 02:40 DG Hand Complete Left CLINICAL DATA:  Left hand and wrist swelling following fall and left hand injury  EXAM: LEFT HAND - COMPLETE 3+ VIEW  COMPARISON:  None Available.  FINDINGS: The fingers are held in fixed  flexion on all presented images. No acute fracture or dislocation. Degenerative calcification of the triangular fibrocartilage complex. Extensive diffuse soft tissue swelling of the left hand and wrist noted.  IMPRESSION: 1. Extensive diffuse soft tissue swelling. No acute fracture or dislocation.  Electronically Signed   By: Helyn Numbers M.D.   On: 11/19/2022 02:39  Lab Results  Component Value Date   WBC 10.0 11/19/2022   HGB 12.2 11/19/2022   HCT 37.3 11/19/2022   MCV 88.2 11/19/2022   PLT 225 11/19/2022   Last metabolic panel Lab Results  Component Value Date   GLUCOSE 36 (LL) 11/19/2022   NA 131 (L) 11/19/2022   K 3.9 11/19/2022   CL 100 11/19/2022   CO2 24 11/19/2022   BUN 34 (H) 11/19/2022   CREATININE 0.64 11/19/2022   GFRNONAA >60 11/19/2022   CALCIUM 8.8 (L) 11/19/2022   PHOS 2.8 02/17/2022   PROT 6.9 11/19/2022   ALBUMIN 3.8 11/19/2022   BILITOT 0.4 11/19/2022   ALKPHOS 58 11/19/2022   AST 22 11/19/2022   ALT 20 11/19/2022   ANIONGAP 7 11/19/2022    Assessment and Plan: * Hypoglycemia Blood sugars in the 30s on presentation Noted insulin pump in place Will hold insulin pump for now-may benefit from removal as this has been a recurring issue Serial blood sugar checks Diabetic coordinator management consult Follow  Pain and swelling of left wrist Left wrist redness and swelling status post fall today Patient does report left wrist pain and swelling prior to fall with?   Subjective chills Plain films negative for occult fracture  LUE u/s negative for DVT Will start empiric cellulitis coverage Given wrist swelling and redness, will check MRI of the wrist to rule out any joint involvement versus occult fracture Pain control Otherwise monitor closely  Encephalopathy + generalized lethargy in setting of hypoglycemia, L wrist redness and swelling   CT head WNL   Will monitor mentation with treatment  Ammonia level  UDS  Some concern for ?progressive functional decline at home  Noted to live at home alone  PT/OT evaluation  May benefit from placement  Minimize sedating medications    Fall + fall at home w/ L wrist trauma in setting of blood sugar in 30s  Suspect symptomatic hypoglycemia  Baseline general weakness confounding issue  CT head WNL  Fall precautions  PT/OT evaluation  Follow  Anticipate placement   Portal vein obstruction Portal vein thrombus formally diagnosed during 04/2022 admission Discussed w/ vascular surgery  Cont eliquis pending repeat abdominal imaging  Consider dcing eliquis if imaging negative or if there is a significant prospective fall risk.     Greater than 50% was spent in counseling and coordination of care with patient Total encounter time 80 minutes or more    Advance Care Planning:   Code Status: Full Code   Consults: None   Family Communication: Case discussed w/ patient's daughter   Severity of Illness: The appropriate patient status for this patient is INPATIENT. Inpatient status is judged to be reasonable and necessary in order to provide the required intensity of service to ensure the patient's safety. The patient's presenting symptoms, physical exam findings, and initial radiographic and laboratory data in the context of their chronic comorbidities is felt to place them at high risk for further clinical deterioration. Furthermore, it is not anticipated that the patient will be medically stable for  discharge from the hospital within 2 midnights of admission.   * I certify that at the  point of admission it is my clinical judgment that the patient will require inpatient hospital care spanning beyond 2 midnights from the point of admission due to high intensity of service, high risk for further deterioration and high frequency of surveillance required.*  Author: Floydene Flock, MD 11/19/2022 8:13 AM  For on call review [email or web address].

## 2022-11-19 NOTE — Progress Notes (Signed)
Patient arrived to floor in lethargic state.  Patient alert to painful stimuli only.  Vital signs stable except temp 99.9, blood glucose 184.  Dr Alvester Morin notified of difficult to awaken.  Dr Alvester Morin at bedside, no addition orders.

## 2022-11-19 NOTE — ED Notes (Signed)
Insulin suspended x2 hours at this time on pt's pod

## 2022-11-19 NOTE — ED Triage Notes (Signed)
Pt reports falling out of bed last night and injuring her left hand. Pt has significant swelling to left hand/wrist. Pt states she also hit her head when she fell, denies taking blood thinners.
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
## 2022-11-19 NOTE — ED Notes (Signed)
Assisted to restroom.

## 2022-11-19 NOTE — Progress Notes (Signed)
MRI of the left wrist showing concerns for possible tenosynovitis Currently on Rocephin and vancomycin for infectious coverage Will formally consult orthopedic surgery for general recommendations  Nursing also concerned about patient with lethargy Patient evaluated the bedside Patient is sleeping and responding to questions Will add on urine drug screen and ammonia level Otherwise monitor

## 2022-11-19 NOTE — Assessment & Plan Note (Addendum)
Left wrist redness and swelling status post fall today Patient does report left wrist pain and swelling prior to fall with?  Subjective chills Plain films negative for occult fracture  LUE u/s negative for DVT Will start empiric cellulitis coverage Given wrist swelling and redness, will check MRI of the wrist to rule out any joint involvement versus occult fracture Pain control Otherwise monitor closely  Clinical update:  MRI wrist concerning for tenosynovitis.  Case reviewed by orthopedic surgery w/ Dr. Audelia Acton  Per Dr. Audelia Acton, case discussed w/ hand surgery Dr. Herbie Baltimore  Will need to be transferred to [facility] [place] for extensive debridement.

## 2022-11-19 NOTE — ED Notes (Signed)
Informed RN bed assigned 

## 2022-11-19 NOTE — Evaluation (Signed)
Occupational Therapy Evaluation Patient Details Name: Suzanne Snyder MRN: [id number] DOB: 12/14/1950 Today's Date: 11/19/2022   History of Present Illness Per MD Notes: Suzanne Snyder is a 72 year old female presenting to the emergency department for evaluation of hand pain.  In triage, patient reported that she fell out of her bed last night and injured her hand.  She does report that she hit her head.  Was noted to be confused in triage, so blood sugar was obtained which returned low at 36. CT of hand/wrist showed extensive diffuse soft tissue swelling. No acute fracture or dislocation.   Clinical Impression   Suzanne Snyder was seen for OT evaluation this date. Prior to hospital admission, pt was independent/MOD I for ADL management. Pt lives alone. She provides minimal information t/o session, so information regarding PLOF/home set up obtained via chart review. Pt presents to acute OT demonstrating impaired ADL performance and functional mobility 2/2 decreased functional use and increased pain in her LUE, decreased activity tolerance, and decreased cognition (See OT problem list for additional functional deficits). Pt currently requires MIN A for toileting, toilet transfer, bed mobility and LB clothing management.  Pt would benefit from skilled OT services to address noted impairments and functional limitations (see below for any additional details) in order to maximize safety and independence while minimizing falls risk and caregiver burden. Anticipate the need for follow up OT services upon acute hospital DC.        If plan is discharge home, recommend the following: A little help with walking and/or transfers;A little help with bathing/dressing/bathroom;A lot of help with bathing/dressing/bathroom;Assistance with cooking/housework;Assist for transportation;Help with stairs or ramp for entrance    Functional Status Assessment  Patient has had a recent decline in their functional status and  demonstrates the ability to make significant improvements in function in a reasonable and predictable amount of time.  Equipment Recommendations  None recommended by OT    Recommendations for Other Services       Precautions / Restrictions Precautions Precautions: Fall Precaution Comments: LUE very swollen, pain limited. Negative for acute fx. Restrictions Weight Bearing Restrictions: No      Mobility Bed Mobility Overal bed mobility: Needs Assistance Bed Mobility: Supine to Sit, Sit to Supine     Supine to sit: Min assist Sit to supine: Min assist        Transfers Overall transfer level: Needs assistance Equipment used: 1 person hand held assist Transfers: Sit to/from Stand Sit to Stand: Min assist                  Balance Overall balance assessment: Needs assistance Sitting-balance support: Single extremity supported, Feet supported Sitting balance-Leahy Scale: Fair     Standing balance support: During functional activity, Single extremity supported Standing balance-Leahy Scale: Fair                             ADL either performed or assessed with clinical judgement   ADL Overall ADL's : Needs assistance/impaired                                       General ADL Comments: Pt is functionally limited by increased pain, decreased safety awareness, and decreased activity tolerance. She requires MOD A for LB clothing management after toileting, MIN A for STS from EOB and commode, close CGA to amb  to room commode.     Vision Baseline Vision/History: 1 Wears glasses Ability to See in Adequate Light: 1 Impaired Patient Visual Report: No change from baseline       Perception         Praxis         Pertinent Vitals/Pain Pain Assessment Pain Assessment: No/denies pain     Extremity/Trunk Assessment Upper Extremity Assessment Upper Extremity Assessment: Generalized weakness;Left hand dominant (LUE difficult to assess  2/2 increased pain. Trace grip strength appreciated. L hand/arm notably swollen, hot to touch. Immaging negative for acute fx/DVT.)   Lower Extremity Assessment Lower Extremity Assessment: Generalized weakness (Pt unable to come to full stand despite cueing. Walks with knees partially bent.)   Cervical / Trunk Assessment Cervical / Trunk Assessment: Kyphotic   Communication Communication Communication: Difficulty communicating thoughts/reduced clarity of speech Cueing Techniques: Verbal cues;Gestural cues;Tactile cues   Cognition Arousal: Alert Behavior During Therapy: Restless, Anxious, Lability Overall Cognitive Status: No family/caregiver present to determine baseline cognitive functioning                                 General Comments: Tearful t/o session. Endorses concerns over her daughter/SIL entering her home "illegally" states she is concerned SIL has stolen her car. Difficult to re-direct to task. Cries out in pain t/o much of session. Minimially conversational, but oriented to self and place.     General Comments       Exercises Other Exercises Other Exercises: Pt educated on role of OT in acute setting, safe positioning for LUE to maximize comfort and minimize swelling, safe transfer technique, and falls prevention strategies for home and hospital.   Shoulder Instructions      Home Living Family/patient expects to be discharged to:: Private residence Living Arrangements: Alone   Type of Home: House Home Access: Stairs to enter [employer] of Steps: 4 steps with L railing from garage entrance; 6 steps with B railings (unable to reach both at same time) from front entrance Entrance Stairs-Rails: Right;Left;Can reach both Home Layout: One level     Bathroom Shower/Tub: Producer, television/film/video: Standard Bathroom Accessibility: Yes   Home Equipment: Agricultural consultant (2 wheels);Rollator (4 wheels);Grab bars - tub/shower;Hand held  shower head;BSC/3in1;Wheelchair - manual;Shower seat - built in;Grab bars - toilet   Additional Comments: bed rail; stool with rail (to get in/out of higher bed); 2 walking sticks      Prior Functioning/Environment Prior Level of Function : Independent/Modified Independent;Driving             Mobility Comments: Pt denies AE use for functional mobility in the home. She is very pain limited and minimally conversational during session. PLOF information primarily obtained through chart review. ADLs Comments: Ind with self care and IADLs;        OT Problem List: Decreased strength;Decreased range of motion;Decreased safety awareness;Decreased coordination;Impaired balance (sitting and/or standing);Decreased knowledge of use of DME or AE;Impaired UE functional use;Decreased activity tolerance      OT Treatment/Interventions: Self-care/ADL training;Therapeutic exercise;Therapeutic activities;Cognitive remediation/compensation;DME and/or AE instruction;Balance training;Patient/family education;Energy conservation;Neuromuscular education    OT Goals(Current goals can be found in the care plan section) Acute Rehab OT Goals Patient Stated Goal: To feel better OT Goal Formulation: With patient Time For Goal Achievement: 12/03/22 Potential to Achieve Goals: Good ADL Goals Pt Will Perform Grooming: standing;with set-up;with supervision Pt Will Perform Lower Body Dressing: sit to/from stand;with supervision;with  set-up Pt Will Transfer to Toilet: bedside commode;with supervision;with set-up;ambulating;regular height toilet Pt Will Perform Toileting - Clothing Manipulation and hygiene: sit to/from stand;with set-up;with supervision;with adaptive equipment  OT Frequency: Min 1X/week    Co-evaluation              AM-PAC OT "6 Clicks" Daily Activity     Outcome Measure Help from another person eating meals?: A Little Help from another person taking care of personal grooming?: A  Little Help from another person toileting, which includes using toliet, bedpan, or urinal?: A Little Help from another person bathing (including washing, rinsing, drying)?: A Lot Help from another person to put on and taking off regular upper body clothing?: A Little Help from another person to put on and taking off regular lower body clothing?: A Lot 6 Click Score: 16   End of Session Equipment Utilized During Treatment: Gait belt Nurse Communication: Mobility status  Activity Tolerance: Patient tolerated treatment well Patient left: in bed;with call bell/phone within reach  OT Visit Diagnosis: Other abnormalities of gait and mobility (R26.89);Pain Pain - Right/Left: Left Pain - part of body: Hand;Arm                Time: 5638-7564 OT Time Calculation (min): 22 min Charges:  OT General Charges $OT Visit: 1 Visit OT Evaluation $OT Eval Moderate Complexity: 1 Mod OT Treatments $Self Care/Home Management : 8-22 mins  Rockney Ghee, M.S., OTR/L 11/19/22, 12:53 PM

## 2022-11-19 NOTE — ED Notes (Signed)
Pt resting comfortably at this time.

## 2022-11-19 NOTE — ED Notes (Addendum)
This RN and Dr. Rosalia Hammers at bedside. Dr. Rosalia Hammers wants insulin pump turned off. Pt is altered and not able to tell staff how to manage it. Per note from 11/02/22 by Carlena Sax pt has a OmniPod (PIN: [phone]) remote at bedside at this time. Only allowed for insulin delivery to be paused for 2 hours. This RN witnessed Dr. Rosalia Hammers suspend insulin delivery for 2 hours.  Bri, primary RN at bedside to recheck pt's CBG.

## 2022-11-19 NOTE — Assessment & Plan Note (Addendum)
+   generalized lethargy in setting of hypoglycemia, L wrist redness and swelling   CT head WNL   Will monitor mentation with treatment  Ammonia level  UDS  Some concern for ?progressive functional decline at home  Noted to live at home alone  PT/OT evaluation  May benefit from placement  Minimize sedating medications

## 2022-11-19 NOTE — Inpatient Diabetes Management (Addendum)
Inpatient Diabetes Program Recommendations  AACE/ADA: New Consensus Statement on Inpatient Glycemic Control (2015)  Target Ranges:  Prepandial:   less than 140 mg/dL      Peak postprandial:   less than 180 mg/dL (1-2 hours)      Critically ill patients:  140 - 180 mg/dL   Lab Results  Component Value Date   GLUCAP 101 (H) 11/19/2022   HGBA1C 7.8 (H) 05/06/2022    Review of Glycemic Control  Diabetes history: DM1 Outpatient Diabetes medications: Omnipod insulin pump Current orders for Inpatient glycemic control: Insulin pump suspended due to hypoglycemia  Inpatient Diabetes Program Recommendations:   Last office visit with endocrinologist Dr. Tedd Sias was 11/02/22 with list of insulin pump settings: Basal rate 12 am 0.5 units/hr 6 am 0.75 units/hr 8 am 0.4 units/hr 6 pm 0.45 units/hr 24hr basal = 11.2 units  Bolus settings: Sensitivity 12 am = 90  Carb ratio 12am = 24 Target blood glucose 120 Correction 130   RN states patient not alert to manage her insulin pump and had hypoglycemia.  Recommend: -D/C insulin pump (turn off for now) -Add Semglee 6 units daily -Novolog 0-6 units tid, 0-5 units hs correction (Novolog 0-6 units q 4 hrs. If NPO)  1:00 pm Spoke with daughter and son in law via phone to ask if insulin pump supplies could be brought to the hospital to have when patient restarts her insulin pump. Daughter agreed to get supplies and bring to patient @ the hospital. When met with patient earlier this am @ bedside, noted patient had given Novologn6.8 units @ midnight last night. Son in law states patient normally eats 11-12 midnight time, so is normal to give meal coverage @ that time. Patient was unable to tell me how much she had eaten when given this amount of insulin.  When patient eating 50% meals, please consider: -Novolog 3 units tid meal coverage  Thank you, Darel Hong E. Trashaun Streight, RN, MSN, CDCES  Diabetes Coordinator Inpatient Glycemic Control Team Team Pager  [phone] (8am-5pm) 11/19/2022 9:19 AM

## 2022-11-19 NOTE — ED Provider Notes (Signed)
[facility] Provider Note    Event Date/Time   First MD Initiated Contact with Patient 11/19/22 0208     (approximate)   History   Hand Pain   HPI  Suzanne Snyder is a 72 year old female presenting to the emergency department for evaluation of hand pain.  In triage, patient reported that she fell out of her bed last night and injured her hand.  She does report that she hit her head.  She was dropped off by her son-in-law who did not state to provide history.  Denies fever or chills.  Was noted to be confused in triage, so blood sugar was obtained which returned low at 36.     Physical Exam   Triage Vital Signs: ED Triage Vitals  Encounter Vitals Group     BP 11/19/22 0207 (!) 160/53     Systolic BP Percentile --      Diastolic BP Percentile --      Pulse Rate 11/19/22 0207 75     Resp 11/19/22 0207 16     Temp 11/19/22 0207 98.9 F (37.2 C)     Temp Source 11/19/22 0207 Oral     SpO2 11/19/22 0207 100 %     Weight 11/19/22 0205 116 lb (52.6 kg)     Height 11/19/22 0205 5\' 8"  (1.727 m)     Head Circumference --      Peak Flow --      Pain Score 11/19/22 0205 10     Pain Loc --      Pain Education --      Exclude from Growth Chart --     Most recent vital signs: Vitals:   11/19/22 0430 11/19/22 0500  BP: (!) 163/65 (!) 181/53  Pulse: 62 64  Resp: 14 14  Temp:    SpO2: 94% 93%   Nursing notes and vital signs reviewed.  General: Adult female, laying in bed, somnolent but arousable Head: Atraumatic Chest: Symmetric chest rise, no tenderness to palpation.  Cardiac: Regular rhythm and rate.  Respiratory: Lungs clear to auscultation Abdomen: Soft, nondistended. No tenderness to palpation.  Pelvis: Stable in AP and lateral compression. No tenderness to palpation. MSK: No deformity to bilateral upper and lower extremity.  There is significant swelling over the left hand and wrist with associated erythema and warmth and tenderness.  No  ecchymosis or bony deformity visible. Neuro: Alert, oriented to self, confusion about situation and time.  Somewhat waxing and waning mental status. Skin: No evidence of burns    ED Results / Procedures / Treatments   Labs (all labs ordered are listed, but only abnormal results are displayed) Labs Reviewed  CBC WITH DIFFERENTIAL/PLATELET - Abnormal; Notable for the following components:      Result Value   Monocytes Absolute 1.1 (*)    All other components within normal limits  COMPREHENSIVE METABOLIC PANEL - Abnormal; Notable for the following components:   Sodium 131 (*)    Glucose, Bld 36 (*)    BUN 34 (*)    Calcium 8.8 (*)    All other components within normal limits  CBG MONITORING, ED - Abnormal; Notable for the following components:   Glucose-Capillary 36 (*)    All other components within normal limits  CBG MONITORING, ED - Abnormal; Notable for the following components:   Glucose-Capillary 123 (*)    All other components within normal limits  CBG MONITORING, ED  CBG MONITORING, ED  CBG MONITORING, ED  EKG EKG independently reviewed interpreted by myself (ER attending) demonstrates:    RADIOLOGY Imaging independently reviewed and interpreted by myself demonstrates:  Hand and wrist x-Job [place] with soft tissue swelling without acute bony injury CT head without acute bleed CT C-spine without acute fracture Left upper extremity ultrasound without DVT  PROCEDURES:  Critical Care performed: Yes, see critical care procedure note(s)  CRITICAL CARE Performed by: Trinna Post   Total critical care time: 32 minutes  Critical care time was exclusive of separately billable procedures and treating other patients.  Critical care was necessary to treat or prevent imminent or life-threatening deterioration.  Critical care was time spent personally by me on the following activities: development of treatment plan with patient and/or surrogate as well as nursing, discussions  with consultants, evaluation of patient's response to treatment, examination of patient, obtaining history from patient or surrogate, ordering and performing treatments and interventions, ordering and review of laboratory studies, ordering and review of radiographic studies, pulse oximetry and re-evaluation of patient's condition.   Procedures   MEDICATIONS ORDERED IN ED: Medications  cefTRIAXone (ROCEPHIN) 2 g in sodium chloride 0.9 % 100 mL IVPB (2 g Intravenous New Bag/Given 11/19/22 0621)  dextrose 50 % solution 50 mL (has no administration in time range)  albuterol (PROVENTIL) (2.5 MG/3ML) 0.083% nebulizer solution 2.5 mg (has no administration in time range)  apixaban (ELIQUIS) tablet 5 mg (has no administration in time range)  dextrose 10 % infusion (has no administration in time range)  dextrose 50 % solution 50 mL (50 mLs Intravenous Given 11/19/22 0220)     IMPRESSION / MDM / ASSESSMENT AND PLAN / ED COURSE  I reviewed the triage vital signs and the nursing notes.  Differential diagnosis includes, but is not limited to, altered mental status secondary to hypoglycemia, acute infection, cellulitis, DVT, fracture, soft tissue injury  Patient's presentation is most consistent with acute presentation with potential threat to life or bodily function.  72 year old female presenting confused found to be hypoglycemic with stable vitals on presentation.  Reporting left hand pain with swelling, erythema, warmth.  Reported history of trauma, but x-rays negative for fracture.  Received D50 with improvement in blood glucose, but remained with somnolence and confused mental status.  Attempted to obtain collateral from daughter, but nobody answered the phone.  From viewable outside records, does appear that she has normal mental status at baseline.  Unclear exact etiology of her encephalopathy, potential related to acute infectious process with cellulitis of her hand.  Does not meet sepsis criteria,  with her altered mental status, do think she is appropriate for admission.  Antibiotics ordered.  Will reach to hospitalist team.  Case reviewed with Dr. Para March.  She did recommend turning off patient's insulin pump and starting a dextrose drip. She will evaluate the patient for anticipated admission.  D10 drip ordered.  I was able to find patient's insulin pump on her right leg.  She did have a remote in her purse (code: [id number]) that I left at bedside, but I did not see an option to completely turn off the pump.  I did pause her insulin with max allowed duration of 2 hours which will end around 0800.  ED RN aware and will pass on this information to nurse assuming care.      FINAL CLINICAL IMPRESSION(S) / ED DIAGNOSES   Final diagnoses:  Hypoglycemia  Cellulitis of right hand     Rx / DC Orders   ED Discharge Orders  None        Note:  This document was prepared using Dragon voice recognition software and may include unintentional dictation errors.   Trinna Post, MD 11/19/22 [phone]

## 2022-11-19 NOTE — Progress Notes (Signed)
Pharmacy Antibiotic Note  Suzanne Snyder is a 72 y.o. female admitted on 11/19/2022 with hand cellulitis.  Pharmacy has been consulted for vancomycin dosing.  -also on Ceftriaxone  Plan: Will order vancomycin 1250mg  x 1 as loading dose Will continue with Vancomycin 1000 mg IV Q 24 hrs. Goal AUC 400-550. Expected AUC: 533 SCr used: 0.8  (actual 0.64) Cmin 12.2  F/u renal fxn, cultures etc   Height: 5\' 8"  (172.7 cm) Weight: 52.6 kg (116 lb) IBW/kg (Calculated) : 63.9  Temp (24hrs), Avg:98.9 F (37.2 C), Min:98.9 F (37.2 C), Max:98.9 F (37.2 C)  Recent Labs  Lab 11/19/22 0222  WBC 10.0  CREATININE 0.64    Estimated Creatinine Clearance: 54.3 mL/min (by C-G formula based on SCr of 0.64 mg/dL).    Allergies  Allergen Reactions   Cat Hair Extract Shortness Of Breath   Pregabalin Anaphylaxis   Erythromycin Other (See Comments)    Severe stomach upset    Antimicrobials this admission: Ceftriaxone  11/7 >>   Vancomycin  11/7 >>    Dose adjustments this admission:    Microbiology results:   BCx: pending   UCx:      Sputum:      MRSA PCR:    Thank you for allowing pharmacy to be a part of this patient's care.  Bari Mantis PharmD Clinical Pharmacist 11/19/2022

## 2022-11-19 NOTE — ED Notes (Signed)
Patient tearful during assessment. Patient following commands at this time. Patient changed into hospital gown at this time.

## 2022-11-19 NOTE — ED Notes (Signed)
Insulin pump taken off per diabetes coordinator and Dr Alvester Morin.

## 2022-11-19 NOTE — Consult Note (Signed)
ORTHOPAEDIC CONSULTATION  REQUESTING PHYSICIAN: Floydene Flock, MD  Chief Complaint:   Left hand swelling and pain, low-grade fever  History of Present Illness: Suzanne Snyder is a 72 y.o. female  with medical history significant of type 1 diabetes, asthma, tobacco abuse, neuropathy, chronic pain, portal vein thrombosis on eliquis presenting w/ fall, L wrist redness and swelling, encephalopathy, hypoglycemia.  Consulted for evaluation secondary to MRI concerning for flexor and extensor tenosynovitis with a large collection in the volar wrist and severe hand swelling and pain.  The patient reports a and intermittently changing history across the exams of 1 to 3 days of left hand pain with worsening of hand pain after a fall from her bed last night.  She was found to have severe hypoglycemia and was admitted and underwent workup including an MRI of her hand which showed concerning signs for flexor tenosynovitis.  She reports that she has severe pain with any motion of her fingers and all 5 fingers with swelling of all 5 fingers and the wrist.  She denies any history of similar symptoms or any recent infections.  She denies any chest pain, shortness of breath, numbness at this time.  She is right-hand dominant.  It is unclear on questioning whether or not she is taking any blood thinners.  Past Medical History:  Diagnosis Date   Anxiety    Arthritis    Asthma    Diabetes mellitus without complication (HCC)    Insulin pump in place    Osteoporosis    RLS (restless legs syndrome)    Smokers' cough (HCC)    Wears dentures    full upper and lower   Past Surgical History:  Procedure Laterality Date   CATARACT EXTRACTION W/PHACO Right 02/20/2020   Procedure: CATARACT EXTRACTION PHACO AND INTRAOCULAR LENS PLACEMENT (IOC) RIGHT 5.60 00:36.3;  Surgeon: Galen Manila, MD;  Location: [facility];  Service: Ophthalmology;   Laterality: Right;  Diabetic - insulin pump Requests not first   CATARACT EXTRACTION W/PHACO Left 03/05/2020   Procedure: CATARACT EXTRACTION PHACO AND INTRAOCULAR LENS PLACEMENT (IOC) LEFT 4.94 00:31.6;  Surgeon: Galen Manila, MD;  Location: [facility];  Service: Ophthalmology;  Laterality: Left;   HIP PINNING,CANNULATED Right 02/09/2022   Procedure: PERCUTANEOUS FIXATION OF FEMORAL NECK;  Surgeon: Deeann Saint, MD;  Location: [facility];  Service: Orthopedics;  Laterality: Right;   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   ROTATOR CUFF REPAIR Left 2014 and 2015   SPINE SURGERY N/A 1984   L4-5, not sure of procedure   THYROID SURGERY     Social History   Socioeconomic History   Marital status: Married    Spouse name: Not on file   Number of children: Not on file   Years of education: Not on file   Highest education level: Not on file  Occupational History   Not on file  Tobacco Use   Smoking status: Every Day    Current packs/day: 1.00    Average packs/day: 1 pack/day for 50.0 years (50.0 ttl pk-yrs)    Types: Cigarettes   Smokeless tobacco: Never   Tobacco comments:    started smoking age 89  Vaping Use   Vaping status: Former   Devices: "tried itChiropractor and Sexual Activity   Alcohol use: No   Drug use: No   Sexual activity: Not on file  Other Topics Concern   Not on file  Social History Narrative   Not on file  Social Determinants of Health   Financial Resource Strain: Low Risk  (07/01/2021)   Received from [facility], [facility]   Overall Financial Resource Strain (CARDIA)    Difficulty of Paying Living Expenses: Not hard at all  Food Insecurity: No Food Insecurity (11/19/2022)   Hunger Vital Sign    Worried About Running Out of Food in the Last Year: Never true    Ran Out of Food in the Last Year: Never true  Transportation Needs: No Transportation Needs (11/19/2022)   PRAPARE - Scientist, research (physical sciences) (Medical): No    Lack of Transportation (Non-Medical): No  Physical Activity: Inactive (07/01/2021)   [facility] from [facility], [facility]   Exercise Vital Sign    Days of Exercise per Week: 0 days    Minutes of Exercise per Session: 0 min  Stress: Stress Concern Present (07/01/2021)   Received from [facility], [facility]   [employer] of Occupational Health - Occupational Stress Questionnaire    Feeling of Stress : Very much  Social Connections: Socially Isolated (07/01/2021)   Received from [facility], [facility]   Social Connection and Isolation Panel [NHANES]    Frequency of Communication with Friends and Family: Never    Frequency of Social Gatherings with Friends and Family: Never    Attends Religious Services: Never    Database administrator or Organizations: No    Attends Engineer, structural: Never    Marital Status: Married   Family History  Problem Relation Age of Onset   Parkinsonism Father    Allergies  Allergen Reactions   Cat Hair Extract Shortness Of Breath   Pregabalin Anaphylaxis   Erythromycin Other (See Comments)    Severe stomach upset   Prior to Admission medications   Medication Sig Start Date End Date Taking? Authorizing Provider  acetaminophen (TYLENOL) 325 MG tablet Take 2 tablets (650 mg total) by mouth every 6 (six) hours as needed for mild pain, moderate pain, fever or headache (or Fever >/= 101). 02/17/22  Yes Gillis Santa, MD  acyclovir (ZOVIRAX) 400 MG tablet Take 400 mg by mouth 5 (five) times daily.   Yes [provider]  albuterol (PROVENTIL) (2.5 MG/3ML) 0.083% nebulizer solution Take 2.5 mg by nebulization every 4 (four) hours as needed for wheezing or shortness of breath.   Yes [provider]  albuterol (VENTOLIN HFA) 108 (90 Base) MCG/ACT inhaler Inhale into the lungs every 6 (six)  hours as needed for wheezing or shortness of breath.   Yes [provider]  apixaban (ELIQUIS) 5 MG TABS tablet Take 1 tablet (5 mg total) by mouth 2 (two) times daily. 05/15/22  Yes Loyce Dys, MD  bisacodyl (DULCOLAX) 10 MG suppository Place 1 suppository (10 mg total) rectally daily as needed for severe constipation. 02/17/22  Yes Gillis Santa, MD  bisacodyl (DULCOLAX) 5 MG EC tablet Take 2 tablets (10 mg total) by mouth daily as needed for moderate constipation. 02/17/22  Yes Gillis Santa, MD  celecoxib (CELEBREX) 100 MG capsule Take 100 mg by mouth at bedtime. 05/15/22  Yes [provider]  clobetasol (TEMOVATE) 0.05 % external solution Apply topically to itchy areas of scalp twice daily as needed for flares. Avoid applying to face, groin, and axilla. Use as directed. 10/21/22  Yes Elie Goody, MD  clobetasol cream (TEMOVATE) 0.05 % Apply topically to  arms and hands twice daily as needed for eczema flares. Avoid applying to face, groin, and axilla. Use as directed. 10/21/22  Yes Elie Goody, MD  clonazePAM (KLONOPIN) 0.5 MG tablet Take 1 tablet (0.5 mg total) by mouth 2 (two) times daily as needed for anxiety. 02/17/22  Yes Gillis Santa, MD  cyclobenzaprine (FLEXERIL) 10 MG tablet Take 10 mg by mouth 3 (three) times daily as needed for muscle spasms.   Yes [provider]  denosumab (PROLIA) 60 MG/ML SOSY injection Inject 60 mg into the skin every 6 (six) months.   Yes [provider]  furosemide (LASIX) 20 MG tablet Take 20 mg by mouth daily as needed for edema or fluid. 12/31/21  Yes [provider]  gabapentin (NEURONTIN) 300 MG capsule Take 900 mg by mouth 2 (two) times daily.   Yes [provider]  hydrocortisone 2.5 % cream Apply to itchy rash around eyes twice daily until clear. 10/21/22  Yes Elie Goody, MD  hydrOXYzine (ATARAX) 25 MG tablet Take 50 mg by mouth 2 (two) times daily. 03/17/22  Yes [provider]   insulin aspart (NOVOLOG) 100 UNIT/ML injection Inject 40 Units into the skin daily. AS INSTRUCTED VIA OMNIPOD INSULIN PUMP 10/13/22  Yes [provider]  ipratropium (ATROVENT) 0.02 % nebulizer solution Take 0.5 mg by nebulization every 6 (six) hours as needed for wheezing or shortness of breath.   Yes [provider]  Multiple Vitamin (MULTIVITAMIN ADULT PO) Take by mouth 2 (two) times daily.   Yes [provider]  mupirocin ointment (BACTROBAN) 2 % Apply 1 Application topically daily as needed.   Yes [provider]  Omega-3 Fatty Acids (FISH OIL) 1000 MG CAPS Take 2 capsules by mouth daily. 08/04/19  Yes [provider]  polyethylene glycol (MIRALAX) 17 g packet Take 17 g by mouth daily. 02/11/22  Yes Wouk, Wilfred Curtis, MD  rOPINIRole (REQUIP) 1 MG tablet Take 1.5 mg by mouth at bedtime.   Yes [provider]  terbinafine (LAMISIL) 1 % cream Apply topically to feet twice daily for tinea pedis 10/21/22  Yes Elie Goody, MD  Vitamin D, Ergocalciferol, (DRISDOL) 50000 UNITS CAPS capsule Take 150,000 Units by mouth 3 (three) times a week.   Yes [provider]  Monte Fantasia INHUB 500-50 MCG/ACT AEPB Inhale 1 puff into the lungs in the morning and at bedtime. 09/03/21  Yes [provider]  apixaban (ELIQUIS) 5 MG TABS tablet Take 2 tablets (10 mg total) by mouth 2 (two) times daily for 4 days. 05/11/22 08/25/22  Loyce Dys, MD  hydrOXYzine (ATARAX/VISTARIL) 10 MG tablet TAKE 1 OR 2 TABLET BY MOUTH THREE TIMES DAILY AS NEEDED FOR [facility] Patient not taking: Reported on 11/19/2022 03/19/20   Willeen Niece, MD  iron polysaccharides (NIFEREX) 150 MG capsule Take 1 capsule (150 mg total) by mouth daily. 02/17/22 08/25/22  Gillis Santa, MD  pantoprazole (PROTONIX) 40 MG tablet Take 1 tablet (40 mg total) by mouth daily. 05/06/22 08/25/22  Pilar Jarvis, MD  polyvinyl alcohol (LIQUIFILM TEARS) 1.4 % ophthalmic solution Place 1 drop into both eyes as  needed for dry eyes. 02/17/22   Gillis Santa, MD  Saccharomyces boulardii (PROBIOTIC) 250 MG CAPS Take 1 capsule by mouth 3 times/day as needed-between meals & bedtime. Patient not taking: Reported on 11/19/2022 01/07/22   Willy Eddy, MD   MR WRIST LEFT WO CONTRAST  Result Date: 11/19/2022 CLINICAL DATA:  Septic arthritis suspected. Larey Seat out of bed last night  injuring left hand. Significant swelling. EXAM: MR OF THE LEFT WRIST WITHOUT CONTRAST TECHNIQUE: Multiplanar, multisequence MR imaging of the left wrist was performed. No intravenous contrast was administered. The technologist reports the patient was coached to attempt and complete exam. Best scans achievable for patient's cooperation and pain tolerance. No axial T2 weighted sequence was performed. COMPARISON:  Left hand and wrist radiographs 11/19/2022, left hand radiographs 06/25/2021 FINDINGS: Despite efforts by the technologist and patient, moderate motion artifact is present on today's exam and could not be eliminated. This reduces exam sensitivity and specificity. Ligaments: No definite scapholunate or lunotriquetral ligament tear. Triangular fibrocartilage: There is narrowing of the distal radial and ulnar joint spaces with the scaphoid and lunate. There is high-grade attenuation of the central radial aspect of the triangular fibrocartilage, likely chronic tearing associated with ulnar-lunate abutment. There is intermediate T2 signal within the peripheral aspect of the triangular fibrocartilage complex, likely chronic sprain. Tendons: Limited evaluation given lack of axial T2 weighted sequence. There is moderate subcutaneous fat edema and swelling throughout the dorsal wrist extending through the visualized level of the metacarpals, dorsal to the dorsal extensor tendon compartments. No definitive dorsal extensor compartment tenosynovitis is seen. There does appear to be intermediate T1 and intermediate T2 signal within some of the fourth  dorsal extensor compartment tendons at the level of the radiocarpal joint (axial series 1026, image 12 and sagittal series 1010 image 20). This is compatible with tendinosis. There is mild-to-moderate fluid within the tendon sheaths of the flexor tendons at the level of the distal radial metaphysis (coronal series 1042 images 19 through 23). Deep to this, there is teardrop shaped fluid in between the flexor tendons and the distal volar radius in a region measuring up to approximately 1.8 x 0.9 x 3.1 cm (transverse by AP by craniocaudal, coronal series 1042, image 17 and sagittal series 1010, image 19). There is also fluid within the more distal flexor tendon sheaths seen on coronal images at the level of the metacarpal shafts, moderate at the level of the fourth and fifth digits and mild at the first through third digits (coronal series 1034 images 19 through 26). Carpal tunnel/median nerve: Within the limitations of lack of axial T2 weighted images, no gross abnormality is seen of the medial nerve or carpal tunnel. Guyon's canal: No gross abnormality. Joint/cartilage: There are mild radiocarpal and midcarpal joint effusions. Moderate fluid extending into the distal radioulnar joint. Diffuse high-grade partial to full-thickness cartilage loss within the radiocarpal, triscaphe, and first through fifth carpometacarpal joints. There are multiple marginal degenerative subchondral cystic changes, greatest within the peripheral capitate and distal triquetrum. Bones/carpal alignment: Normal alignment. No acute fracture is seen. Other: None. IMPRESSION: 1. No fracture this exam is limited by patient motion artifact and inability to complete the entire study. No axial T2 weighted sequences were performed. 2. Moderate subcutaneous fat edema and swelling throughout the dorsal wrist extending through the visualized level of the metacarpals, dorsal to the dorsal extensor tendon compartments. This is nonspecific and may represent  cellulitis. 3. Mild-to-moderate flexor tenosynovitis at the level of the distal radial metaphysis. Deep to this, there is teardrop shaped fluid in between the flexor tendons and the distal volar radius in a region measuring up to approximately 1.8 x 0.9 x 3.1 cm. 4. There is also tenosynovitis within the more distal flexor tendon sheaths at the level of the metacarpal shafts, moderate at the level of the fourth and fifth digits and mild at the first through third digits.  5. No definitive dorsal extensor compartment tenosynovitis is seen. There does appear to be tendinosis of some of the fourth dorsal extensor compartment tendons at the level of the radiocarpal joint based on limited coronal images and axial T1 weighted images. 6. Mild radiocarpal and midcarpal joint effusions. Moderate fluid extending into the distal radioulnar joint. 7. Diffuse high-grade partial to full-thickness cartilage loss within the radiocarpal, triscaphe, and first through fifth carpometacarpal joints. 8. High-grade attenuation of the central radial aspect of the triangular fibrocartilage, likely chronic tearing associated with ulnar-lunate abutment. 9. MRI cannot determine the presence or absence of infection within the tenosynovitis or joint fluid described above. No cortical erosion or marrow edema is seen to indicate acute osteomyelitis. Electronically Signed   By: Neita Garnet M.D.   On: 11/19/2022 12:27   US Venous Img Upper Left (DVT Study)  Result Date: 11/19/2022 CLINICAL DATA:  72 year old female with left side hand and wrist swelling. Pain and edema. EXAM: LEFT UPPER EXTREMITY VENOUS DOPPLER ULTRASOUND TECHNIQUE: Gray-scale sonography with graded compression, as well as color Doppler and duplex ultrasound were performed to evaluate the upper extremity deep venous system from the level of the subclavian vein and including the jugular, axillary, basilic, radial, ulnar and upper cephalic vein. Spectral Doppler was utilized to  evaluate flow at rest and with distal augmentation maneuvers. COMPARISON:  Left shoulder CT 04/30/2021. Previous right upper extremity venous Doppler 12/23/2020. FINDINGS: Contralateral Subclavian Vein: Respiratory phasicity is normal and symmetric with the symptomatic side. No evidence of thrombus. Normal compressibility. Internal Jugular Vein: No evidence of thrombus. Normal compressibility, respiratory phasicity and response to augmentation. Subclavian Vein: No evidence of thrombus. Normal compressibility, respiratory phasicity and response to augmentation. Axillary Vein: No evidence of thrombus. Normal compressibility, respiratory phasicity and response to augmentation. Cephalic Vein: No evidence of thrombus. Normal compressibility, respiratory phasicity and response to augmentation. Basilic Vein: No evidence of thrombus. Normal compressibility, respiratory phasicity and response to augmentation. Brachial Veins: No evidence of thrombus. Normal compressibility, respiratory phasicity and response to augmentation. Radial Veins: No evidence of thrombus. Normal compressibility, respiratory phasicity and response to augmentation. Ulnar Veins: No evidence of thrombus. Normal compressibility, respiratory phasicity and response to augmentation. Venous Reflux:  None visualized. Other Findings:  None visualized. IMPRESSION: No evidence of DVT within the left upper extremity. Electronically Signed   By: Odessa Fleming M.D.   On: 11/19/2022 04:54   CT Cervical Spine Wo Contrast  Result Date: 11/19/2022 CLINICAL DATA:  Neck trauma (Age >= 65y).  Fall EXAM: CT CERVICAL SPINE WITHOUT CONTRAST TECHNIQUE: Multidetector CT imaging of the cervical spine was performed without intravenous contrast. Multiplanar CT image reconstructions were also generated. RADIATION DOSE REDUCTION: This exam was performed according to the departmental dose-optimization program which includes automated exposure control, adjustment of the mA and/or kV  according to patient size and/or use of iterative reconstruction technique. COMPARISON:  12/02/2021 FINDINGS: Alignment: 3 mm of anterolisthesis of C3 on C4 and C7 on T1. Skull base and vertebrae: No acute fracture. No primary bone lesion or focal pathologic process. Soft tissues and spinal canal: No prevertebral fluid or swelling. No visible canal hematoma. Disc levels: Diffuse advanced degenerative disc disease and moderate bilateral degenerative facet disease. Upper chest: No acute findings Other: None IMPRESSION: Moderate to advanced degenerative disc and facet disease. No acute bony abnormality. Electronically Signed   By: Charlett Nose M.D.   On: 11/19/2022 03:03   CT Head Wo Contrast  Result Date: 11/19/2022 CLINICAL DATA:  Head trauma,  minor (Age >= 65y), fall EXAM: CT HEAD WITHOUT CONTRAST TECHNIQUE: Contiguous axial images were obtained from the base of the skull through the vertex without intravenous contrast. RADIATION DOSE REDUCTION: This exam was performed according to the departmental dose-optimization program which includes automated exposure control, adjustment of the mA and/or kV according to patient size and/or use of iterative reconstruction technique. COMPARISON:  12/02/2021 FINDINGS: Brain: No acute intracranial abnormality. Specifically, no hemorrhage, hydrocephalus, mass lesion, acute infarction, or significant intracranial injury. Mild age related atrophy. Vascular: No hyperdense vessel or unexpected calcification. Skull: No acute calvarial abnormality. Sinuses/Orbits: No acute findings Other: None IMPRESSION: No acute intracranial abnormality. Electronically Signed   By: Charlett Nose M.D.   On: 11/19/2022 03:01   DG Wrist Complete Left  Result Date: 11/19/2022 CLINICAL DATA:  Fall, left wrist injury and swelling EXAM: LEFT WRIST - COMPLETE 3+ VIEW COMPARISON:  None Available. FINDINGS: Normal alignment. No acute fracture or dislocation. Joint spaces are preserved. Degenerative  chondrocalcinosis of the triangular fibrocartilage complex. There is diffuse soft tissue swelling surrounding the left wrist. IMPRESSION: 1. Diffuse soft tissue swelling. No acute fracture or dislocation. Electronically Signed   By: Helyn Numbers M.D.   On: 11/19/2022 02:40   DG Hand Complete Left  Result Date: 11/19/2022 CLINICAL DATA:  Left hand and wrist swelling following fall and left hand injury EXAM: LEFT HAND - COMPLETE 3+ VIEW COMPARISON:  None Available. FINDINGS: The fingers are held in fixed flexion on all presented images. No acute fracture or dislocation. Degenerative calcification of the triangular fibrocartilage complex. Extensive diffuse soft tissue swelling of the left hand and wrist noted. IMPRESSION: 1. Extensive diffuse soft tissue swelling. No acute fracture or dislocation. Electronically Signed   By: Helyn Numbers M.D.   On: 11/19/2022 02:39    Positive ROS: All other systems have been reviewed and were otherwise negative with the exception of those mentioned in the HPI and as above.  Physical Exam: General:  Alert, no acute distress Psychiatric:  Patient is competent for consent with normal mood and affect   Cardiovascular:  No pedal edema Respiratory:  No wheezing, non-labored breathing GI:  Abdomen is soft and non-tender Skin:  No lesions in the area of chief complaint Neurologic:  Sensation intact distally Lymphatic:  No axillary or cervical lymphadenopathy  Orthopedic Exam:  Left upper extremity There is significant swelling, warmth, and erythema to the volar and dorsal aspect of the entire hand and wrist extending from the distal third of the forearm to all 5 fingers There is fusiform swelling of all 5 fingers with tenderness along the flexor tendon sheaths of all 5 fingers and the hand is being held in a flexed position across all 5 fingers.  There is significant fullness and severe pain with palpation over the carpal tunnel and volar wrist.  She also has  significant pain over the dorsum of her entire hand. A clinical photo was placed in the chart There is severe pain with passive extension of all 5 fingers localized over the volar wrist and hand.  All the compartments in the hand feels soft on exam despite the tenderness and swelling There is an intact radial pulse and brisk capillary refill to all fingertips  X-rays:  X-rays and MRI reviewed reads noted above there is a large fluid collection over the volar distal radius deep to the flexor tendons with fluid noted in all 5 flexor tendon sheaths up to the distal metacarpal shafts.  There are some limitations to  the study as there is no axial T2 cuts.  Agree with radiologist interpretation.  Assessment: Left hand swelling and tenosynovitis on both the flexor and extensor as well as a soft tissue fluid collection in the deep space overlying the distal radius  Plan: I reviewed the case and imaging with hand on-call for [facility] Dr Kerry Fort  and discussed potential treatment options.  Given the findings on the MRI and physical exam despite a lack of infectious history there is ongoing high clinical concern for an infectious flexor tenosynovitis and potential abscess over the distal radius.  Recommendation from the hand surgeon was that this patient would likely benefit from an exploration with incision and drainage of the wrist, carpal tunnel, and potentially the flexor tendons of all the fingers.  As the entire hand is involved including all flexor tendons and the distal volar wrist I do feel that this patient would benefit from the expertise of a hand fellowship trained surgeon.  After discussion with Dr. Kerry Fort, he is willing to help out with this patient in consultation with a medicine to medicine hospitalist transfer to [facility] for further evaluation and possible surgical intervention as the patient remained stable at this time.   I discussed all these findings with the patient  and explained the plan.  I also discussed it with the medical team who will assist with arranging transfer to [facility].  For now continue IV antibiotics and recommend elevation of the hand with warm compresses as tolerated.     Reinaldo Berber MD  Beeper #:  [phone]  11/19/2022 8:13 PM

## 2022-11-19 NOTE — ED Notes (Signed)
Placed on 2L [place] to maintain SpO2 >92%

## 2022-11-19 NOTE — Assessment & Plan Note (Addendum)
Portal vein thrombus formally diagnosed during 04/2022 admission Discussed w/ vascular surgery  Cont eliquis pending repeat abdominal imaging  Consider dcing eliquis if imaging negative or if there is a significant prospective fall risk.  Consider transition to heparin gtt perioperatively

## 2022-11-19 NOTE — Assessment & Plan Note (Addendum)
Blood sugars in the 30s on presentation Noted insulin pump in place Holding Diabetic management consulted  On long insulin 6u and 0-6 novolog TID Blood sugars improving into 200s  Serial blood sugar checks Follow

## 2022-11-19 NOTE — Discharge Summary (Signed)
Physician Discharge Summary   Patient: Suzanne Snyder MRN: [id number] DOB: 1950/11/07  Admit date:     11/19/2022  Discharge date: 11/19/22  Discharge Physician: Floydene Flock   PCP: Gracelyn Nurse, MD   Recommendations at discharge:     Suzanne Snyder is a 72 y.o. female with medical history significant of type 1 diabetes, asthma, tobacco abuse, neuropathy, chronic pain, portal vein thrombosis on eliquis presenting w/ fall, L wrist redness and swelling, encephalopathy, hypoglycemia.  Limited history in the setting of encephalopathy.  Per report, patient with fall at home.  Baseline history of type 1 diabetes.  Blood sugars into the 30s when noted evaluation by EMS.  Insulin pump in place.  Patient was secondary fall on the left wrist with worsening pain and swelling.  Patient does report?  Pain and swelling of the wrist prior to event.  Somewhat unclear.  Was brought to the ER by her daughter this morning.  No active chest pain or shortness of breath.?  Chills at home.  No abdominal pain.  Unclear if patient is still taking Eliquis in the setting of diagnosis of portal vein thrombosis April of this year. Presented to the ER afebrile, hemodynamically stable.  Satting well on room air.  Hemodynamically stable.  White count 10, hemoglobin 12.2, platelets 225.  Blood sugar 30s on presentation.  CT head and CT C-spine grossly within norm limits.  Left wrist plain films with soft tissue swelling and no bony involvement.   MRI wrist concerning for tenosynovitis.  Case reviewed by orthopedic surgery w/ Dr. Audelia Acton  Per Dr. Audelia Acton, case discussed w/ hand surgery Dr. Herbie Baltimore  Will need to be transferred to [facility] for extensive debridement  Pt accepted to hospitalist service at [facility]  Discharge Diagnoses: Principal Problem:   Hypoglycemia Active Problems:   Pain and swelling of left wrist   Encephalopathy   Fall   Portal vein obstruction  Resolved Problems:   * No resolved hospital  problems. *  Hospital Course: No notes on file  Assessment and Plan: * Hypoglycemia Blood sugars in the 30s on presentation Noted insulin pump in place Holding Diabetic management consulted  On long insulin 6u and 0-6 novolog TID Blood sugars improving into 200s  Serial blood sugar checks Follow  Pain and swelling of left wrist Left wrist redness and swelling status post fall today Patient does report left wrist pain and swelling prior to fall with?  Subjective chills Plain films negative for occult fracture  LUE u/s negative for DVT Will start empiric cellulitis coverage Given wrist swelling and redness, will check MRI of the wrist to rule out any joint involvement versus occult fracture Pain control Otherwise monitor closely  Clinical update:  MRI wrist concerning for tenosynovitis.  Case reviewed by orthopedic surgery w/ Dr. Audelia Acton  Per Dr. Audelia Acton, case discussed w/ hand surgery Dr. Herbie Baltimore  Will need to be transferred to [facility] for extensive debridement.   Encephalopathy + generalized lethargy in setting of hypoglycemia, L wrist redness and swelling   CT head WNL   Will monitor mentation with treatment  Ammonia level  UDS  Some concern for ?progressive functional decline at home  Noted to live at home alone  PT/OT evaluation  May benefit from placement  Minimize sedating medications    Fall + fall at home w/ L wrist trauma in setting of blood sugar in 30s  Suspect symptomatic hypoglycemia  Baseline general weakness confounding issue  CT head WNL  Fall  precautions  PT/OT evaluation  Follow  Anticipate placement   Portal vein obstruction Portal vein thrombus formally diagnosed during 04/2022 admission Discussed w/ vascular surgery  Cont eliquis pending repeat abdominal imaging  Consider dcing eliquis if imaging negative or if there is a significant prospective fall risk.  Consider transition to heparin gtt perioperatively          Consultants:  Orthopedic Surgery Procedures performed: None   Disposition:  Transfer [facility]  Diet recommendation:  Carb modified diet DISCHARGE MEDICATION: .med  Discharge Exam: Filed Weights   11/19/22 0205  Weight: 52.6 kg     Condition at discharge: stable  The results of significant diagnostics from this hospitalization (including imaging, microbiology, ancillary and laboratory) are listed below for reference.   Imaging Studies: MR WRIST LEFT WO CONTRAST  Result Date: 11/19/2022 CLINICAL DATA:  Septic arthritis suspected. Larey Seat out of bed last night injuring left hand. Significant swelling. EXAM: MR OF THE LEFT WRIST WITHOUT CONTRAST TECHNIQUE: Multiplanar, multisequence MR imaging of the left wrist was performed. No intravenous contrast was administered. The technologist reports the patient was coached to attempt and complete exam. Best scans achievable for patient's cooperation and pain tolerance. No axial T2 weighted sequence was performed. COMPARISON:  Left hand and wrist radiographs 11/19/2022, left hand radiographs 06/25/2021 FINDINGS: Despite efforts by the technologist and patient, moderate motion artifact is present on today's exam and could not be eliminated. This reduces exam sensitivity and specificity. Ligaments: No definite scapholunate or lunotriquetral ligament tear. Triangular fibrocartilage: There is narrowing of the distal radial and ulnar joint spaces with the scaphoid and lunate. There is high-grade attenuation of the central radial aspect of the triangular fibrocartilage, likely chronic tearing associated with ulnar-lunate abutment. There is intermediate T2 signal within the peripheral aspect of the triangular fibrocartilage complex, likely chronic sprain. Tendons: Limited evaluation given lack of axial T2 weighted sequence. There is moderate subcutaneous fat edema and swelling throughout the dorsal wrist extending through the visualized level of the metacarpals, dorsal to the dorsal  extensor tendon compartments. No definitive dorsal extensor compartment tenosynovitis is seen. There does appear to be intermediate T1 and intermediate T2 signal within some of the fourth dorsal extensor compartment tendons at the level of the radiocarpal joint (axial series 1026, image 12 and sagittal series 1010 image 20). This is compatible with tendinosis. There is mild-to-moderate fluid within the tendon sheaths of the flexor tendons at the level of the distal radial metaphysis (coronal series 1042 images 19 through 23). Deep to this, there is teardrop shaped fluid in between the flexor tendons and the distal volar radius in a region measuring up to approximately 1.8 x 0.9 x 3.1 cm (transverse by AP by craniocaudal, coronal series 1042, image 17 and sagittal series 1010, image 19). There is also fluid within the more distal flexor tendon sheaths seen on coronal images at the level of the metacarpal shafts, moderate at the level of the fourth and fifth digits and mild at the first through third digits (coronal series 1034 images 19 through 26). Carpal tunnel/median nerve: Within the limitations of lack of axial T2 weighted images, no gross abnormality is seen of the medial nerve or carpal tunnel. Guyon's canal: No gross abnormality. Joint/cartilage: There are mild radiocarpal and midcarpal joint effusions. Moderate fluid extending into the distal radioulnar joint. Diffuse high-grade partial to full-thickness cartilage loss within the radiocarpal, triscaphe, and first through fifth carpometacarpal joints. There are multiple marginal degenerative subchondral cystic changes, greatest within the peripheral capitate  and distal triquetrum. Bones/carpal alignment: Normal alignment. No acute fracture is seen. Other: None. IMPRESSION: 1. No fracture this exam is limited by patient motion artifact and inability to complete the entire study. No axial T2 weighted sequences were performed. 2. Moderate subcutaneous fat edema  and swelling throughout the dorsal wrist extending through the visualized level of the metacarpals, dorsal to the dorsal extensor tendon compartments. This is nonspecific and may represent cellulitis. 3. Mild-to-moderate flexor tenosynovitis at the level of the distal radial metaphysis. Deep to this, there is teardrop shaped fluid in between the flexor tendons and the distal volar radius in a region measuring up to approximately 1.8 x 0.9 x 3.1 cm. 4. There is also tenosynovitis within the more distal flexor tendon sheaths at the level of the metacarpal shafts, moderate at the level of the fourth and fifth digits and mild at the first through third digits. 5. No definitive dorsal extensor compartment tenosynovitis is seen. There does appear to be tendinosis of some of the fourth dorsal extensor compartment tendons at the level of the radiocarpal joint based on limited coronal images and axial T1 weighted images. 6. Mild radiocarpal and midcarpal joint effusions. Moderate fluid extending into the distal radioulnar joint. 7. Diffuse high-grade partial to full-thickness cartilage loss within the radiocarpal, triscaphe, and first through fifth carpometacarpal joints. 8. High-grade attenuation of the central radial aspect of the triangular fibrocartilage, likely chronic tearing associated with ulnar-lunate abutment. 9. MRI cannot determine the presence or absence of infection within the tenosynovitis or joint fluid described above. No cortical erosion or marrow edema is seen to indicate acute osteomyelitis. Electronically Signed   By: Neita Garnet M.D.   On: 11/19/2022 12:27   US Venous Img Upper Left (DVT Study)  Result Date: 11/19/2022 CLINICAL DATA:  72 year old female with left side hand and wrist swelling. Pain and edema. EXAM: LEFT UPPER EXTREMITY VENOUS DOPPLER ULTRASOUND TECHNIQUE: Gray-scale sonography with graded compression, as well as color Doppler and duplex ultrasound were performed to evaluate the  upper extremity deep venous system from the level of the subclavian vein and including the jugular, axillary, basilic, radial, ulnar and upper cephalic vein. Spectral Doppler was utilized to evaluate flow at rest and with distal augmentation maneuvers. COMPARISON:  Left shoulder CT 04/30/2021. Previous right upper extremity venous Doppler 12/23/2020. FINDINGS: Contralateral Subclavian Vein: Respiratory phasicity is normal and symmetric with the symptomatic side. No evidence of thrombus. Normal compressibility. Internal Jugular Vein: No evidence of thrombus. Normal compressibility, respiratory phasicity and response to augmentation. Subclavian Vein: No evidence of thrombus. Normal compressibility, respiratory phasicity and response to augmentation. Axillary Vein: No evidence of thrombus. Normal compressibility, respiratory phasicity and response to augmentation. Cephalic Vein: No evidence of thrombus. Normal compressibility, respiratory phasicity and response to augmentation. Basilic Vein: No evidence of thrombus. Normal compressibility, respiratory phasicity and response to augmentation. Brachial Veins: No evidence of thrombus. Normal compressibility, respiratory phasicity and response to augmentation. Radial Veins: No evidence of thrombus. Normal compressibility, respiratory phasicity and response to augmentation. Ulnar Veins: No evidence of thrombus. Normal compressibility, respiratory phasicity and response to augmentation. Venous Reflux:  None visualized. Other Findings:  None visualized. IMPRESSION: No evidence of DVT within the left upper extremity. Electronically Signed   By: Odessa Fleming M.D.   On: 11/19/2022 04:54   CT Cervical Spine Wo Contrast  Result Date: 11/19/2022 CLINICAL DATA:  Neck trauma (Age >= 65y).  Fall EXAM: CT CERVICAL SPINE WITHOUT CONTRAST TECHNIQUE: Multidetector CT imaging of the cervical spine was  performed without intravenous contrast. Multiplanar CT image reconstructions were also  generated. RADIATION DOSE REDUCTION: This exam was performed according to the departmental dose-optimization program which includes automated exposure control, adjustment of the mA and/or kV according to patient size and/or use of iterative reconstruction technique. COMPARISON:  12/02/2021 FINDINGS: Alignment: 3 mm of anterolisthesis of C3 on C4 and C7 on T1. Skull base and vertebrae: No acute fracture. No primary bone lesion or focal pathologic process. Soft tissues and spinal canal: No prevertebral fluid or swelling. No visible canal hematoma. Disc levels: Diffuse advanced degenerative disc disease and moderate bilateral degenerative facet disease. Upper chest: No acute findings Other: None IMPRESSION: Moderate to advanced degenerative disc and facet disease. No acute bony abnormality. Electronically Signed   By: Charlett Nose M.D.   On: 11/19/2022 03:03   CT Head Wo Contrast  Result Date: 11/19/2022 CLINICAL DATA:  Head trauma, minor (Age >= 65y), fall EXAM: CT HEAD WITHOUT CONTRAST TECHNIQUE: Contiguous axial images were obtained from the base of the skull through the vertex without intravenous contrast. RADIATION DOSE REDUCTION: This exam was performed according to the departmental dose-optimization program which includes automated exposure control, adjustment of the mA and/or kV according to patient size and/or use of iterative reconstruction technique. COMPARISON:  12/02/2021 FINDINGS: Brain: No acute intracranial abnormality. Specifically, no hemorrhage, hydrocephalus, mass lesion, acute infarction, or significant intracranial injury. Mild age related atrophy. Vascular: No hyperdense vessel or unexpected calcification. Skull: No acute calvarial abnormality. Sinuses/Orbits: No acute findings Other: None IMPRESSION: No acute intracranial abnormality. Electronically Signed   By: Charlett Nose M.D.   On: 11/19/2022 03:01   DG Wrist Complete Left  Result Date: 11/19/2022 CLINICAL DATA:  Fall, left wrist  injury and swelling EXAM: LEFT WRIST - COMPLETE 3+ VIEW COMPARISON:  None Available. FINDINGS: Normal alignment. No acute fracture or dislocation. Joint spaces are preserved. Degenerative chondrocalcinosis of the triangular fibrocartilage complex. There is diffuse soft tissue swelling surrounding the left wrist. IMPRESSION: 1. Diffuse soft tissue swelling. No acute fracture or dislocation. Electronically Signed   By: Helyn Numbers M.D.   On: 11/19/2022 02:40   DG Hand Complete Left  Result Date: 11/19/2022 CLINICAL DATA:  Left hand and wrist swelling following fall and left hand injury EXAM: LEFT HAND - COMPLETE 3+ VIEW COMPARISON:  None Available. FINDINGS: The fingers are held in fixed flexion on all presented images. No acute fracture or dislocation. Degenerative calcification of the triangular fibrocartilage complex. Extensive diffuse soft tissue swelling of the left hand and wrist noted. IMPRESSION: 1. Extensive diffuse soft tissue swelling. No acute fracture or dislocation. Electronically Signed   By: Helyn Numbers M.D.   On: 11/19/2022 02:39    Microbiology: Results for orders placed or performed during the hospital encounter of 02/13/22  Culture, blood (Routine X 2) w Reflex to ID Panel     Status: None   Collection Time: 02/15/22  8:58 AM   Specimen: BLOOD  Result Value Ref Range Status   Specimen Description BLOOD BLOOD LEFT HAND  Final   Special Requests   Final    BOTTLES DRAWN AEROBIC AND ANAEROBIC Blood Culture results may not be optimal due to an excessive volume of blood received in culture bottles   Culture   Final    NO GROWTH 5 DAYS Performed at [facility], [address] [place], [place] [address]    Report Status 02/20/2022 FINAL  Final  Culture, blood (Routine X 2) w Reflex to ID Panel  Status: None   Collection Time: 02/15/22  9:05 AM   Specimen: BLOOD  Result Value Ref Range Status   Specimen Description BLOOD BLOOD RIGHT HAND  Final   Special  Requests   Final    BOTTLES DRAWN AEROBIC AND ANAEROBIC BLOOD RIGHT HAND   Culture   Final    NO GROWTH 5 DAYS Performed at [facility], [address] [place], [place] [address]    Report Status 02/20/2022 FINAL  Final    Labs: CBC: Recent Labs  Lab 11/19/22 0222  WBC 10.0  NEUTROABS 6.5  HGB 12.2  HCT 37.3  MCV 88.2  PLT 225   Basic Metabolic Panel: Recent Labs  Lab 11/19/22 0222  NA 131*  K 3.9  CL 100  CO2 24  GLUCOSE 36*  BUN 34*  CREATININE 0.64  CALCIUM 8.8*   Liver Function Tests: Recent Labs  Lab 11/19/22 0222  AST 22  ALT 20  ALKPHOS 58  BILITOT 0.4  PROT 6.9  ALBUMIN 3.8   CBG: Recent Labs  Lab 11/19/22 0600 11/19/22 0751 11/19/22 1031 11/19/22 1135 11/19/22 1540  GLUCAP 87 101* 226* 218* 184*    Discharge time spent: less than 30 minutes.  Signed: Floydene Flock, MD [facility] 11/19/2022

## 2022-11-20 ENCOUNTER — Encounter: Payer: Self-pay | Admitting: Family Medicine

## 2022-11-20 ENCOUNTER — Inpatient Hospital Stay: Payer: Medicare Other | Admitting: Anesthesiology

## 2022-11-20 ENCOUNTER — Encounter
Admission: EM | Disposition: A | Discharge status: Home Health | Payer: Self-pay | Source: Home / Self Care | Attending: Internal Medicine

## 2022-11-20 DIAGNOSIS — E162 Hypoglycemia, unspecified: Secondary | ICD-10-CM | POA: Diagnosis not present

## 2022-11-20 HISTORY — PX: CARPAL TUNNEL RELEASE: SHX101

## 2022-11-20 LAB — COMPREHENSIVE METABOLIC PANEL
ALT: 16 U/L (ref 0–44)
AST: 13 U/L — ABNORMAL LOW (ref 15–41)
Albumin: 3.3 g/dL — ABNORMAL LOW (ref 3.5–5.0)
Alkaline Phosphatase: 68 U/L (ref 38–126)
Anion gap: 9 (ref 5–15)
BUN: 18 mg/dL (ref 8–23)
CO2: 23 mmol/L (ref 22–32)
Calcium: 7.8 mg/dL — ABNORMAL LOW (ref 8.9–10.3)
Chloride: 98 mmol/L (ref 98–111)
Creatinine, Ser: 0.61 mg/dL (ref 0.44–1.00)
GFR, Estimated: >60 mL/min (ref >60)
Glucose, Bld: 158 mg/dL — ABNORMAL HIGH (ref 70–99)
Potassium: 3.7 mmol/L (ref 3.5–5.1)
Sodium: 130 mmol/L — ABNORMAL LOW (ref 135–145)
Total Bilirubin: 0.9 mg/dL (ref <1.2)
Total Protein: 6.5 g/dL (ref 6.5–8.1)

## 2022-11-20 LAB — GLUCOSE, CAPILLARY
Glucose-Capillary: 177 mg/dL — ABNORMAL HIGH (ref 70–99)
Glucose-Capillary: 161 mg/dL — ABNORMAL HIGH (ref 70–99)
Glucose-Capillary: 131 mg/dL — ABNORMAL HIGH (ref 70–99)
Glucose-Capillary: 146 mg/dL — ABNORMAL HIGH (ref 70–99)
Glucose-Capillary: 187 mg/dL — ABNORMAL HIGH (ref 70–99)
Glucose-Capillary: 237 mg/dL — ABNORMAL HIGH (ref 70–99)
Glucose-Capillary: 203 mg/dL — ABNORMAL HIGH (ref 70–99)

## 2022-11-20 LAB — CBC
HCT: 39.2 % (ref 36.0–46.0)
Hemoglobin: 13.1 g/dL (ref 12.0–15.0)
MCH: 28.6 pg (ref 26.0–34.0)
MCHC: 33.4 g/dL (ref 30.0–36.0)
MCV: 85.6 fL (ref 80.0–100.0)
Platelets: 189 10*3/uL (ref 150–400)
RBC: 4.58 MIL/uL (ref 3.87–5.11)
RDW: 14.9 % (ref 11.5–15.5)
WBC: 7.5 10*3/uL (ref 4.0–10.5)
nRBC: 0 % (ref 0.0–0.2)

## 2022-11-20 LAB — C-REACTIVE PROTEIN: CRP: 4.4 mg/dL — ABNORMAL HIGH (ref <1.0)

## 2022-11-20 SURGERY — INCISION AND DRAINAGE OF DEEP ABSCESS, HAND
Anesthesia: General | Site: Wrist | Laterality: Left

## 2022-11-20 MED ORDER — OXYCODONE HCL 5 MG PO TABS
ORAL_TABLET | ORAL | Status: AC
  Filled 2022-11-20: qty 1

## 2022-11-20 MED ORDER — DROPERIDOL 2.5 MG/ML IJ SOLN: 0.6250 mg | Freq: Once | INTRAMUSCULAR | Status: DC | PRN

## 2022-11-20 MED ORDER — LIDOCAINE HCL (PF) 2 % IJ SOLN
INTRAMUSCULAR | Status: AC
Start: 2022-11-20 — End: ?
  Filled 2022-11-20: qty 5

## 2022-11-20 MED ORDER — ONDANSETRON HCL 4 MG/2ML IJ SOLN
INTRAMUSCULAR | Status: AC
  Filled 2022-11-20: qty 2

## 2022-11-20 MED ORDER — VANCOMYCIN HCL IN DEXTROSE 1-5 GM/200ML-% IV SOLN
INTRAVENOUS | Status: AC
Start: 2022-11-20 — End: ?
  Filled 2022-11-20: qty 200

## 2022-11-20 MED ORDER — PROPOFOL 10 MG/ML IV BOLUS
INTRAVENOUS | Status: DC | PRN
  Administered 2022-11-20: 90 mg via INTRAVENOUS

## 2022-11-20 MED ORDER — LIDOCAINE HCL (PF) 2 % IJ SOLN
INTRAMUSCULAR | Status: DC | PRN
Start: 2022-11-20 — End: 2022-11-20
  Administered 2022-11-20: 100 mg via INTRADERMAL

## 2022-11-20 MED ORDER — DEXAMETHASONE SODIUM PHOSPHATE 10 MG/ML IJ SOLN
INTRAMUSCULAR | Status: DC | PRN
  Administered 2022-11-20: 5 mg via INTRAVENOUS

## 2022-11-20 MED ORDER — MORPHINE SULFATE (PF) 2 MG/ML IV SOLN
2.0000 mg | Freq: Once | INTRAVENOUS | Status: DC
  Filled 2022-11-20: qty 1

## 2022-11-20 MED ORDER — HYDROMORPHONE HCL 1 MG/ML IJ SOLN
INTRAMUSCULAR | Status: AC
  Filled 2022-11-20: qty 1

## 2022-11-20 MED ORDER — SODIUM CHLORIDE 0.9 % IV SOLN: INTRAVENOUS | Status: DC | PRN

## 2022-11-20 MED ORDER — OXYCODONE HCL 5 MG PO TABS
5.0000 mg | ORAL_TABLET | Freq: Once | ORAL | Status: AC | PRN
  Administered 2022-11-20: 5 mg via ORAL

## 2022-11-20 MED ORDER — ONDANSETRON HCL 4 MG/2ML IJ SOLN
INTRAMUSCULAR | Status: DC | PRN
  Administered 2022-11-20: 4 mg via INTRAVENOUS

## 2022-11-20 MED ORDER — FENTANYL CITRATE (PF) 100 MCG/2ML IJ SOLN
25.0000 ug | INTRAMUSCULAR | Status: DC | PRN
  Administered 2022-11-20: 50 ug via INTRAVENOUS
  Administered 2022-11-20 (×4): 25 ug via INTRAVENOUS

## 2022-11-20 MED ORDER — PROPOFOL 10 MG/ML IV BOLUS
INTRAVENOUS | Status: AC
Start: 2022-11-20 — End: ?
  Filled 2022-11-20: qty 20

## 2022-11-20 MED ORDER — FENTANYL CITRATE (PF) 100 MCG/2ML IJ SOLN
INTRAMUSCULAR | Status: DC | PRN
  Administered 2022-11-20 (×2): 50 ug via INTRAVENOUS

## 2022-11-20 MED ORDER — ACETAMINOPHEN 10 MG/ML IV SOLN
INTRAVENOUS | Status: DC | PRN
  Administered 2022-11-20: 1000 mg via INTRAVENOUS

## 2022-11-20 MED ORDER — OXYCODONE HCL 5 MG/5ML PO SOLN: 5.0000 mg | Freq: Once | ORAL | Status: AC | PRN

## 2022-11-20 MED ORDER — HYDROMORPHONE HCL 1 MG/ML IJ SOLN
INTRAMUSCULAR | Status: DC | PRN
  Administered 2022-11-20: .5 mg via INTRAVENOUS

## 2022-11-20 MED ORDER — ACETAMINOPHEN 10 MG/ML IV SOLN
INTRAVENOUS | Status: AC
  Filled 2022-11-20: qty 100

## 2022-11-20 MED ORDER — FENTANYL CITRATE (PF) 100 MCG/2ML IJ SOLN
INTRAMUSCULAR | Status: AC
  Filled 2022-11-20: qty 2

## 2022-11-20 MED ORDER — MORPHINE SULFATE (PF) 2 MG/ML IV SOLN
2.0000 mg | INTRAVENOUS | Status: DC | PRN
  Administered 2022-11-20 – 2022-11-24 (×20): 2 mg via INTRAVENOUS
  Filled 2022-11-20 (×19): qty 1

## 2022-11-20 MED ORDER — MIDAZOLAM HCL 2 MG/2ML IJ SOLN
INTRAMUSCULAR | Status: AC
  Filled 2022-11-20: qty 2

## 2022-11-20 MED ORDER — MIDAZOLAM HCL 2 MG/2ML IJ SOLN
INTRAMUSCULAR | Status: DC | PRN
  Administered 2022-11-20: 1 mg via INTRAVENOUS

## 2022-11-20 MED ORDER — 0.9 % SODIUM CHLORIDE (POUR BTL) OPTIME
TOPICAL | Status: DC | PRN
  Administered 2022-11-20: 500 mL

## 2022-11-20 MED ORDER — BUPIVACAINE HCL (PF) 0.5 % IJ SOLN
INTRAMUSCULAR | Status: AC
  Filled 2022-11-20: qty 30

## 2022-11-20 MED ORDER — DEXAMETHASONE SODIUM PHOSPHATE 10 MG/ML IJ SOLN
INTRAMUSCULAR | Status: AC
  Filled 2022-11-20: qty 1

## 2022-11-20 MED ORDER — ACETAMINOPHEN 10 MG/ML IV SOLN: 1000.0000 mg | Freq: Once | INTRAVENOUS | Status: DC | PRN

## 2022-11-20 SURGICAL SUPPLY — 34 items
APL PRP STRL LF DISP 70% ISPRP (MISCELLANEOUS) ×1
BNDG CMPR 5X4 CHSV STRCH STRL (GAUZE/BANDAGES/DRESSINGS) ×1
BNDG CMPR STD VLCR NS LF 5.8X4 (GAUZE/BANDAGES/DRESSINGS) ×1
BNDG COHESIVE 4X5 TAN STRL LF (GAUZE/BANDAGES/DRESSINGS) ×1 IMPLANT
BNDG ELASTIC 4X5.8 VLCR NS LF (GAUZE/BANDAGES/DRESSINGS) IMPLANT
BNDG ESMARCH 4 X 12 STRL LF (GAUZE/BANDAGES/DRESSINGS)
BNDG ESMARCH 4X12 STRL LF (GAUZE/BANDAGES/DRESSINGS) ×1 IMPLANT
BNDG GAUZE DERMACEA FLUFF 4 (GAUZE/BANDAGES/DRESSINGS) IMPLANT
BNDG GZE DERMACEA 4 6PLY (GAUZE/BANDAGES/DRESSINGS) ×1
CHLORAPREP W/TINT 26 (MISCELLANEOUS) ×2 IMPLANT
CORD BIP STRL DISP 12FT (MISCELLANEOUS) ×1 IMPLANT
CUFF TOURN SGL QUICK 18X4 (TOURNIQUET CUFF) IMPLANT
FORCEPS JEWEL BIP 4-3/4 STR (INSTRUMENTS) ×1 IMPLANT
GAUZE SPONGE 4X4 12PLY STRL (GAUZE/BANDAGES/DRESSINGS) ×1 IMPLANT
GAUZE XEROFORM 1X8 LF (GAUZE/BANDAGES/DRESSINGS) ×1 IMPLANT
GLOVE PI ORTHO PRO STRL 7.5 (GLOVE) IMPLANT
GLOVE SURG SYN 7.5 E (GLOVE) ×1 IMPLANT
GLOVE SURG SYN 7.5 PF PI (GLOVE) IMPLANT
GOWN SRG XL LVL 3 NONREINFORCE (GOWNS) IMPLANT
GOWN STRL NON-REIN TWL XL LVL3 (GOWNS) ×1
IV CATH ANGIO 14GX3.25 ORG (MISCELLANEOUS) IMPLANT
KIT TURNOVER KIT A (KITS) ×1 IMPLANT
MANIFOLD NEPTUNE II (INSTRUMENTS) ×1 IMPLANT
NS IRRIG 500ML POUR BTL (IV SOLUTION) ×1 IMPLANT
PACK EXTREMITY ARMC (MISCELLANEOUS) ×1 IMPLANT
STOCKINETTE IMPERVIOUS 9X36 MD (GAUZE/BANDAGES/DRESSINGS) ×1 IMPLANT
SUT ETHILON 4 0 PS 2 18 (SUTURE) IMPLANT
SUT MNCRL AB 3-0 PS2 27 (SUTURE) IMPLANT
SWAB CULTURE AMIES ANAERIB BLU (MISCELLANEOUS) IMPLANT
SYR 10ML LL (SYRINGE) IMPLANT
SYR 20ML LL LF (SYRINGE) IMPLANT
TOWEL OR 17X26 4PK STRL BLUE (TOWEL DISPOSABLE) IMPLANT
TUBING CONNECTING 10 (TUBING) IMPLANT
penrose drain 12x14 IMPLANT

## 2022-11-20 NOTE — Anesthesia Preprocedure Evaluation (Addendum)
Anesthesia Evaluation  Patient identified by MRN, date of birth, ID band Patient awake    Reviewed: Allergy & Precautions, NPO status , Patient's Chart, lab work & pertinent test results  History of Anesthesia Complications Negative for: history of anesthetic complications  Airway Mallampati: II  TM Distance: >3 FB Neck ROM: full    Dental  (+) Edentulous Upper, Edentulous Lower   Pulmonary asthma , COPD, Current Smoker and Patient abstained from smoking.   Pulmonary exam normal        Cardiovascular negative cardio ROS Normal cardiovascular exam     Neuro/Psych  PSYCHIATRIC DISORDERS Anxiety      Neuromuscular disease    GI/Hepatic negative GI ROS, Neg liver ROS,,,  Endo/Other  diabetes, Type 1, Insulin Dependent  Insulin pump- off now  Renal/GU      Musculoskeletal  (+) Arthritis ,    Abdominal Normal abdominal exam  (+)   Peds  Hematology negative hematology ROS (+)   Anesthesia Other Findings left wrist abscess, flexor tenosynovitis  Past Medical History: No date: Anxiety No date: Arthritis No date: Asthma No date: Diabetes mellitus without complication (HCC) No date: Insulin pump in place No date: Osteoporosis No date: RLS (restless legs syndrome) No date: Smokers' cough (HCC) No date: Wears dentures     Comment:  full upper and lower  Past Surgical History: 02/20/2020: CATARACT EXTRACTION W/PHACO; Right     Comment:  Procedure: CATARACT EXTRACTION PHACO AND INTRAOCULAR               LENS PLACEMENT (IOC) RIGHT 5.60 00:36.3;  Surgeon:               Galen Manila, MD;  Location: [facility];                Service: Ophthalmology;  Laterality: Right;  Diabetic -               insulin pump Requests not first 03/05/2020: CATARACT EXTRACTION W/PHACO; Left     Comment:  Procedure: CATARACT EXTRACTION PHACO AND INTRAOCULAR               LENS PLACEMENT (IOC) LEFT 4.94 00:31.6;  Surgeon:                Galen Manila, MD;  Location: [facility];                Service: Ophthalmology;  Laterality: Left; 2007: JOINT REPLACEMENT; Left     Comment:  total hip 2014 and 2015: ROTATOR CUFF REPAIR; Left 1984: SPINE SURGERY; N/A     Comment:  L4-5, not sure of procedure No date: THYROID SURGERY  BMI    Body Mass Index: 17.33 kg/m      Reproductive/Obstetrics negative OB ROS                             Anesthesia Physical Anesthesia Plan  ASA: 3  Anesthesia Plan: General   Post-op Pain Management: Toradol IV (intra-op)* and Ofirmev IV (intra-op)*   Induction: Intravenous  PONV Risk Score and Plan: Ondansetron, Dexamethasone and Midazolam  Airway Management Planned: LMA  Additional Equipment:   Intra-op Plan:   Post-operative Plan: Extubation in OR  Informed Consent: I have reviewed the patients History and Physical, chart, labs and discussed the procedure including the risks, benefits and alternatives for the proposed anesthesia with the patient or authorized representative who has indicated his/her understanding and acceptance.  Dental Advisory Given  Plan Discussed with: Anesthesiologist, CRNA and Surgeon  Anesthesia Plan Comments:         Anesthesia Quick Evaluation

## 2022-11-20 NOTE — Op Note (Signed)
Patient Name: Suzanne Snyder  NWG:[id number]  Pre-Operative Diagnosis: Left volar wrist abscess, left hand suppurative flexor tenosynovitis  Post-Operative Diagnosis: (same)  Procedure: Left volar wrist abscess I&D left carpal tunnel release left hand flexor tendons irrigation and debridement  Cultures x 3  Date of Surgery: 11/20/2022  Surgeon: Reinaldo Berber MD  Assistant: None   Anesthesiologist: Laural Benes  Anesthesia: General with LMA  Tourniquet Time: 39 min  EBL: 15cc  IVF: 250cc  Complications: None   Brief history: The patient is a 72 year old female presented with 1 day history of severe left wrist and hand swelling and pain with a low-grade fever and a questionable history of trauma.  MRI and physical exam are consistent with an abscess in the wrist and flexor tenosynovitis of all the flexor tendons to the level of the metacarpal.  The risks and benefits of surgical intervention were discussed with the patient agreed to proceed with the procedure including irrigation debridement with carpal tunnel release as well as drain placement.  All preoperative imaging was reviewed and appropriate surgical plan was made after consultation with hand surgery with inability to transfer due to no bed availability.  Description of procedure: The patient was brought to the operating room where laterality was confirmed by all those present to be the left upper extremity.  General anesthesia with an LMA was administered and the patient was already receiving intravenous antibiotics upon arrival to the operating room.    A well-padded tourniquet was applied to the left upper arm.  The arm was prepped and draped in usual sterile fashion multiple layers adhesive nonadhesive drapes.  All those present in the operating room participated in a surgical timeout confirming that the left arm and hand was the correct side.  The left arm was gravity exsanguinated due to the presence of infection and the  tourniquet was raised to 250 mmHg.  A volar approach was made over the distal radius with an extensile incision crossing the wrist flexion crease at a 45 degree angle extending into a carpal tunnel incision stopping proximal to the proximal palmar crease.  Incision was made along the FCR tendon sheath down to the tendon using blunt dissection.  Tendon sheath was opened and the tendon was retracted radially care taken to stay away from the radial artery.  The subsheath was incised and upon entering the space deep to the subs sheath gross purulence was encountered with a large abscess proximal to the pronator quadratus muscle over the distal radius.  Using finger dissection the area was fully developed and was irrigated after 2 cultures were taken in the area.  The distal incision was then extended and careful dissection was taken down to the transverse carpal ligament.  Transverse carpal ligament was carefully dissected with retractors in place to protect the median nerve.  The median nerve was carefully mobilized with blunt dissection and retracted ulnarly to access the common flexor tendon sheath.  The tendon sheath was shown to be inflamed and swollen and upon opening the sheath more purulence was found within the flexor tendons.  A culture was taken from this area as well.  After irrigating the area carefully each finger was milked down from the tip of the finger down to the metacarpal head to assess for any expression of purulence.  Purulence was only found from the thumb at this time.  A small incision was made over the volar radial aspect of the thumb proximal to the IP joint and the tendon sheath was carefully  opened and Angiocath was then used to flush fluid retrograde from the thumb into the palmar space and purulence was found to drain out of the sheath.  After thorough irrigation of the area the Angiocath was placed into the the tendon sheath within the palm and fluid was run into each individual tendon  area and each finger was then milked again to check for any purulent fluid.  No purulent fluid was able to be expressed from any of the other fingers.  The entire field and the vulvar abscess area were then irrigated again with normal saline.  A Penrose drain was placed into the volar wrist abscess space and the tourniquet was dropped.  All fingers immediately pinked up with good capillary refill to the tips of all fingers.  Intact radial pulse was palpated at the end of the case.  All skin bleeders were addressed with bipolar electrocautery with good hemostasis.  The subcutaneous tissue and skin was closed with 3-0 Monocryl and 4-0 nylon and the Penrose was left in place.  A soft dressing was placed and the hand was wrapped gently with 4 x 4's, Kerlix, Ace wrap.  Sponge, needle, and Lap counts were all correct at the end of the case.   The patient was transferred off of the operating room table to a hospital bed, fingers continue to remain pink with good capillary refill after dressing placement.  The patient was transferred to the recovery room in stable condition.   The patient will remain on antibiotics after the surgery and we will closely follow the hand.  I had discussed with the patient that there is a good chance that she may need a repeat irrigation debridement or further exploration in the future.  She will be nonweightbearing left upper extremity at this time.

## 2022-11-20 NOTE — Transfer of Care (Signed)
Immediate Anesthesia Transfer of Care Note  Patient: Suzanne Snyder  Procedure(s) Performed: INCISION AND DRAINAGE OF DEEP ABSCESS WRIST AND HAND (Left)  Patient Location: PACU  Anesthesia Type:General  Level of Consciousness: drowsy  Airway & Oxygen Therapy: Patient Spontanous Breathing and Patient connected to face mask oxygen  Post-op Assessment: Report given to RN and Post -op Vital signs reviewed and stable  Post vital signs: Reviewed and stable  Last Vitals:  Vitals Value Taken Time  BP 117/55 11/20/22 1239  Temp    Pulse 76 11/20/22 1245  Resp 9 11/20/22 1245  SpO2 100 % 11/20/22 1245  Vitals shown include unfiled device data.  Last Pain:  Vitals:   11/20/22 1023  TempSrc:   PainSc: 9       Patients Stated Pain Goal: 0 (11/20/22 1023)  Complications: No notable events documented.

## 2022-11-20 NOTE — Anesthesia Postprocedure Evaluation (Signed)
Anesthesia Post Note  Patient: Suzanne Snyder  Procedure(s) Performed: INCISION AND DRAINAGE OF WRIST, VOLAR ABSCESS OF WRIST, AND FLEXOR TENDONS (Left: Wrist) CARPAL TUNNEL RELEASE (Left: Wrist)  Patient location during evaluation: PACU Anesthesia Type: General Level of consciousness: awake and alert Pain management: pain level controlled Vital Signs Assessment: post-procedure vital signs reviewed and stable Respiratory status: spontaneous breathing, nonlabored ventilation, respiratory function stable and patient connected to nasal cannula oxygen Cardiovascular status: blood pressure returned to baseline and stable Postop Assessment: no apparent nausea or vomiting Anesthetic complications: no   No notable events documented.   Last Vitals:  Vitals:   11/20/22 1300 11/20/22 1305  BP: (!) 117/53   Pulse: 75 75  Resp: (!) 9 (!) 9  Temp: 37 C   SpO2: 100% 99%    Last Pain:  Vitals:   11/20/22 1300  TempSrc:   PainSc: 0-No pain                 Louie Boston

## 2022-11-20 NOTE — Progress Notes (Addendum)
Progress Note    Suzanne Snyder  JYN:[id number] DOB: April 21, 1950  DOA: 11/19/2022 PCP: Gracelyn Nurse, MD      Brief Narrative:    Medical records reviewed and are as summarized below:  Suzanne Snyder is a 72 y.o. female with medical history significant for type 1 diabetes mellitus, asthma, tobacco use disorder, peripheral neuropathy, chronic pain, portal vein thrombosis on Eliquis, who presented to the hospital because of painful swelling of the left hand/wrist following a fall at home.  Her glucose was 36 in the emergency department so her insulin pump was taken off.  She was also found to have left hand abscess and left hand flexor tenosynovitis.    Assessment/Plan:   Principal Problem:   Hypoglycemia Active Problems:   Pain and swelling of left wrist   Acute metabolic encephalopathy   Fall   Portal vein obstruction   Suppurative tenosynovitis of flexor tendon of left hand     Body mass index is 17.63 kg/m.   Left volar wrist abscess, left hand suppurative flexor tenosynovitis: S/p I&D of left volar wrist abscess, left carpal tunnel release, left hand and flexor tendon irrigation and debridement on 11/20/2022. Continue IV antibiotics.  Analgesics as needed for pain (IV morphine and oxycodone).  Follow-up surgical wound cultures.  Follow-up with orthopedic surgeon.   Insulin-dependent diabetes mellitus, s/p hypoglycemia: Insulin pump has been held.  Continue insulin glargine and NovoLog as needed for hyperglycemia   Acute metabolic encephalopathy: Improved   S/p fall: PT and OT evaluation   Portal vein thrombosis: She was on Eliquis prior to admission.  Plan to resume Eliquis when okay with orthopedic surgeon.     Diet Order             Diet NPO time specified  Diet effective now                            Consultants: Orthopedic surgeon  Procedures: I&D of left volar wrist abscess, left carpal tunnel release, left hand and  flexor tendon irrigation and debridement on 11/20/2022    Medications:    insulin aspart  0-6 Units Subcutaneous Q4H   insulin glargine-yfgn  6 Units Subcutaneous Daily    morphine injection  2 mg Intravenous Once   Continuous Infusions:  cefTRIAXone (ROCEPHIN)  IV 2 g (11/20/22 8657)   vancomycin Stopped (11/20/22 1506)     Anti-infectives (From admission, onward)    Start     Dose/Rate Route Frequency Ordered Stop   11/20/22 0900  vancomycin (VANCOCIN) IVPB 1000 mg/200 mL premix        1,000 mg 200 mL/hr over 60 Minutes Intravenous Every 24 hours 11/19/22 0825     11/19/22 0845  vancomycin (VANCOREADY) IVPB 1250 mg/250 mL        1,250 mg 166.7 mL/hr over 90 Minutes Intravenous  Once 11/19/22 0812 11/19/22 1312   11/19/22 0545  cefTRIAXone (ROCEPHIN) 2 g in sodium chloride 0.9 % 100 mL IVPB        2 g 200 mL/hr over 30 Minutes Intravenous Every 24 hours 11/19/22 0541 11/26/22 0544              Family Communication/Anticipated D/C date and plan/Code Status   DVT prophylaxis:      Code Status: Full Code  Family Communication: None Disposition Plan: Plan to discharge home   Status is: Inpatient Remains inpatient appropriate because: On IV antibiotics  for left hand infection       Subjective:   Interval events noted.  She complains of severe pain in the left hand.  No other complaints.  Objective:    Vitals:   11/20/22 1350 11/20/22 1355 11/20/22 1400 11/20/22 1623  BP:   122/87 (!) 120/53  Pulse: 71 74 74 68  Resp: (!) 7 (!) 9 11 16   Temp:    97.6 F (36.4 C)  TempSrc:      SpO2: 93% 92% (!) 89% 96%  Weight:      Height:       No data found.   Intake/Output Summary (Last 24 hours) at 11/20/2022 1658 Last data filed at 11/20/2022 1350 Gross per 24 hour  Intake 883.33 ml  Output 15 ml  Net 868.33 ml   Filed Weights   11/19/22 0205 11/20/22 1023  Weight: 52.6 kg 52.6 kg    Exam:  GEN: NAD SKIN: Warm and dry EYES: No pallor or  icterus ENT: MMM CV: RRR PULM: CTA B ABD: soft, ND, NT, +BS CNS: AAO x 3, non focal EXT: Dressing on left hand is clean, dry and intact        Data Reviewed:   I have personally reviewed following labs and imaging studies:  Labs: Labs show the following:   Basic Metabolic Panel: Recent Labs  Lab 11/19/22 0222 11/20/22 0347  NA 131* 130*  K 3.9 3.7  CL 100 98  CO2 24 23  GLUCOSE 36* 158*  BUN 34* 18  CREATININE 0.64 0.61  CALCIUM 8.8* 7.8*   GFR Estimated Creatinine Clearance: 54.3 mL/min (by C-G formula based on SCr of 0.61 mg/dL). Liver Function Tests: Recent Labs  Lab 11/19/22 0222 11/20/22 0347  AST 22 13*  ALT 20 16  ALKPHOS 58 68  BILITOT 0.4 0.9  PROT 6.9 6.5  ALBUMIN 3.8 3.3*   No results for input(s): "LIPASE", "AMYLASE" in the last 168 hours. Recent Labs  Lab 11/19/22 1809  AMMONIA 32   Coagulation profile No results for input(s): "INR", "PROTIME" in the last 168 hours.  CBC: Recent Labs  Lab 11/19/22 0222 11/20/22 0347  WBC 10.0 7.5  NEUTROABS 6.5  --   HGB 12.2 13.1  HCT 37.3 39.2  MCV 88.2 85.6  PLT 225 189   Cardiac Enzymes: No results for input(s): "CKTOTAL", "CKMB", "CKMBINDEX", "TROPONINI" in the last 168 hours. BNP (last 3 results) No results for input(s): "PROBNP" in the last 8760 hours. CBG: Recent Labs  Lab 11/20/22 0410 11/20/22 0841 11/20/22 1020 11/20/22 1242 11/20/22 1625  GLUCAP 161* 131* 146* 187* 237*   D-Dimer: No results for input(s): "DDIMER" in the last 72 hours. Hgb A1c: Recent Labs    11/19/22 0222  HGBA1C 7.3*   Lipid Profile: No results for input(s): "CHOL", "HDL", "LDLCALC", "TRIG", "CHOLHDL", "LDLDIRECT" in the last 72 hours. Thyroid function studies: No results for input(s): "TSH", "T4TOTAL", "T3FREE", "THYROIDAB" in the last 72 hours.  Invalid input(s): "FREET3" Anemia work up: No results for input(s): "VITAMINB12", "FOLATE", "FERRITIN", "TIBC", "IRON", "RETICCTPCT" in the last 72  hours. Sepsis Labs: Recent Labs  Lab 11/19/22 0222 11/20/22 0347  WBC 10.0 7.5    Microbiology Recent Results (from the past 240 hour(s))  Culture, blood (Routine X 2) w Reflex to ID Panel     Status: None (Preliminary result)   Collection Time: 11/19/22  4:15 PM   Specimen: BLOOD  Result Value Ref Range Status   Specimen Description BLOOD BLOOD LEFT  ARM  Final   Special Requests   Final    BOTTLES DRAWN AEROBIC ONLY Blood Culture results may not be optimal due to an inadequate volume of blood received in culture bottles   Culture   Final    NO GROWTH < 12 HOURS Performed at [facility], [address], [place], [place] [address]    Report Status PENDING  Incomplete  Culture, blood (Routine X 2) w Reflex to ID Panel     Status: None (Preliminary result)   Collection Time: 11/19/22  4:20 PM   Specimen: BLOOD  Result Value Ref Range Status   Specimen Description BLOOD BLOOD LEFT ARM  Final   Special Requests   Final    BOTTLES DRAWN AEROBIC AND ANAEROBIC Blood Culture results may not be optimal due to an inadequate volume of blood received in culture bottles   Culture   Final    NO GROWTH < 12 HOURS Performed at [facility], [address] [place], [place] [address]    Report Status PENDING  Incomplete    Procedures and diagnostic studies:  CT ABDOMEN PELVIS W CONTRAST  Result Date: 11/20/2022 CLINICAL DATA:  Portal vein thrombosis. EXAM: CT ABDOMEN AND PELVIS WITH CONTRAST TECHNIQUE: Multidetector CT imaging of the abdomen and pelvis was performed using the standard protocol following bolus administration of intravenous contrast. RADIATION DOSE REDUCTION: This exam was performed according to the departmental dose-optimization program which includes automated exposure control, adjustment of the mA and/or kV according to patient size and/or use of iterative reconstruction technique. CONTRAST:  OMNIPAQUE IOHEXOL 300 MG/ML  SOLN COMPARISON:  Abdominal  MRI 06/22/2022. CT 05/06/2022 FINDINGS: Lower chest: Dependent atelectasis in the lower lobes. Breathing motion artifact. Hepatobiliary: Post cholecystectomy with unchanged intrahepatic biliary ductal dilatation. The previous right lobe liver lesion is not seen on the current exam. Pancreas: Sequela of chronic pancreatitis with diffuse pancreatic calcifications. Atrophy of the pancreatic tail. No acute peripancreatic inflammation. Spleen: Normal in size without focal abnormality. Adrenals/Urinary Tract: No adrenal nodule. No hydronephrosis or suspicious renal abnormality. Small cyst in the upper left kidney. No further follow-up imaging is recommended. Unremarkable urinary bladder. Stomach/Bowel: Detailed bowel assessment is limited in the absence of enteric contrast and paucity of intra-abdominal fat. Fluid within the distal stomach with mild gastric hyperemia. No evidence of small-bowel obstruction. Moderate to large volume of stool in the colon. Chain sutures in the rectum. The appendix is not visualized. 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 The intrahepatic portal veins are patent. No intraluminal filling defects in the portal vein. There is high-grade narrowing of the splenic vein at the portal splenic confluence but no intraluminal filling defects. The superior mesenteric vein is patent. Moderate aortic atherosclerosis. No abdominopelvic adenopathy. Reproductive: Pessary in place.  No adnexal mass. Other: No ascites.  No free air. Musculoskeletal: Surgical hardware in both hips. Lumbar scoliosis and degenerative change. No acute osseous findings. IMPRESSION: 1. 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 2. High-grade narrowing of the splenic vein at the portal splenic confluence but no intraluminal filling defects. 3. Sequela of chronic pancreatitis. 4. Moderate to large volume of stool in the colon. 5. The previous right  lobe liver lesion is not seen on the current exam. Aortic Atherosclerosis (ICD10-I70.0). Electronically Signed   By: Narda Rutherford M.D.   On: 11/20/2022 02:04   MR WRIST LEFT WO CONTRAST  Result Date: 11/19/2022 CLINICAL DATA:  Septic arthritis suspected. Larey Seat  out of bed last night injuring left hand. Significant swelling. EXAM: MR OF THE LEFT WRIST WITHOUT CONTRAST TECHNIQUE: Multiplanar, multisequence MR imaging of the left wrist was performed. No intravenous contrast was administered. The technologist reports the patient was coached to attempt and complete exam. Best scans achievable for patient's cooperation and pain tolerance. No axial T2 weighted sequence was performed. COMPARISON:  Left hand and wrist radiographs 11/19/2022, left hand radiographs 06/25/2021 FINDINGS: Despite efforts by the technologist and patient, moderate motion artifact is present on today's exam and could not be eliminated. This reduces exam sensitivity and specificity. Ligaments: No definite scapholunate or lunotriquetral ligament tear. Triangular fibrocartilage: There is narrowing of the distal radial and ulnar joint spaces with the scaphoid and lunate. There is high-grade attenuation of the central radial aspect of the triangular fibrocartilage, likely chronic tearing associated with ulnar-lunate abutment. There is intermediate T2 signal within the peripheral aspect of the triangular fibrocartilage complex, likely chronic sprain. Tendons: Limited evaluation given lack of axial T2 weighted sequence. There is moderate subcutaneous fat edema and swelling throughout the dorsal wrist extending through the visualized level of the metacarpals, dorsal to the dorsal extensor tendon compartments. No definitive dorsal extensor compartment tenosynovitis is seen. There does appear to be intermediate T1 and intermediate T2 signal within some of the fourth dorsal extensor compartment tendons at the level of the radiocarpal joint (axial series  1026, image 12 and sagittal series 1010 image 20). This is compatible with tendinosis. There is mild-to-moderate fluid within the tendon sheaths of the flexor tendons at the level of the distal radial metaphysis (coronal series 1042 images 19 through 23). Deep to this, there is teardrop shaped fluid in between the flexor tendons and the distal volar radius in a region measuring up to approximately 1.8 x 0.9 x 3.1 cm (transverse by AP by craniocaudal, coronal series 1042, image 17 and sagittal series 1010, image 19). There is also fluid within the more distal flexor tendon sheaths seen on coronal images at the level of the metacarpal shafts, moderate at the level of the fourth and fifth digits and mild at the first through third digits (coronal series 1034 images 19 through 26). Carpal tunnel/median nerve: Within the limitations of lack of axial T2 weighted images, no gross abnormality is seen of the medial nerve or carpal tunnel. Guyon's canal: No gross abnormality. Joint/cartilage: There are mild radiocarpal and midcarpal joint effusions. Moderate fluid extending into the distal radioulnar joint. Diffuse high-grade partial to full-thickness cartilage loss within the radiocarpal, triscaphe, and first through fifth carpometacarpal joints. There are multiple marginal degenerative subchondral cystic changes, greatest within the peripheral capitate and distal triquetrum. Bones/carpal alignment: Normal alignment. No acute fracture is seen. Other: None. IMPRESSION: 1. No fracture this exam is limited by patient motion artifact and inability to complete the entire study. No axial T2 weighted sequences were performed. 2. Moderate subcutaneous fat edema and swelling throughout the dorsal wrist extending through the visualized level of the metacarpals, dorsal to the dorsal extensor tendon compartments. This is nonspecific and may represent cellulitis. 3. Mild-to-moderate flexor tenosynovitis at the level of the distal radial  metaphysis. Deep to this, there is teardrop shaped fluid in between the flexor tendons and the distal volar radius in a region measuring up to approximately 1.8 x 0.9 x 3.1 cm. 4. There is also tenosynovitis within the more distal flexor tendon sheaths at the level of the metacarpal shafts, moderate at the level of the fourth and fifth digits and mild at  the first through third digits. 5. No definitive dorsal extensor compartment tenosynovitis is seen. There does appear to be tendinosis of some of the fourth dorsal extensor compartment tendons at the level of the radiocarpal joint based on limited coronal images and axial T1 weighted images. 6. Mild radiocarpal and midcarpal joint effusions. Moderate fluid extending into the distal radioulnar joint. 7. Diffuse high-grade partial to full-thickness cartilage loss within the radiocarpal, triscaphe, and first through fifth carpometacarpal joints. 8. High-grade attenuation of the central radial aspect of the triangular fibrocartilage, likely chronic tearing associated with ulnar-lunate abutment. 9. MRI cannot determine the presence or absence of infection within the tenosynovitis or joint fluid described above. No cortical erosion or marrow edema is seen to indicate acute osteomyelitis. Electronically Signed   By: Neita Garnet M.D.   On: 11/19/2022 12:27   US Venous Img Upper Left (DVT Study)  Result Date: 11/19/2022 CLINICAL DATA:  72 year old female with left side hand and wrist swelling. Pain and edema. EXAM: LEFT UPPER EXTREMITY VENOUS DOPPLER ULTRASOUND TECHNIQUE: Gray-scale sonography with graded compression, as well as color Doppler and duplex ultrasound were performed to evaluate the upper extremity deep venous system from the level of the subclavian vein and including the jugular, axillary, basilic, radial, ulnar and upper cephalic vein. Spectral Doppler was utilized to evaluate flow at rest and with distal augmentation maneuvers. COMPARISON:  Left shoulder  CT 04/30/2021. Previous right upper extremity venous Doppler 12/23/2020. FINDINGS: Contralateral Subclavian Vein: Respiratory phasicity is normal and symmetric with the symptomatic side. No evidence of thrombus. Normal compressibility. Internal Jugular Vein: No evidence of thrombus. Normal compressibility, respiratory phasicity and response to augmentation. Subclavian Vein: No evidence of thrombus. Normal compressibility, respiratory phasicity and response to augmentation. Axillary Vein: No evidence of thrombus. Normal compressibility, respiratory phasicity and response to augmentation. Cephalic Vein: No evidence of thrombus. Normal compressibility, respiratory phasicity and response to augmentation. Basilic Vein: No evidence of thrombus. Normal compressibility, respiratory phasicity and response to augmentation. Brachial Veins: No evidence of thrombus. Normal compressibility, respiratory phasicity and response to augmentation. Radial Veins: No evidence of thrombus. Normal compressibility, respiratory phasicity and response to augmentation. Ulnar Veins: No evidence of thrombus. Normal compressibility, respiratory phasicity and response to augmentation. Venous Reflux:  None visualized. Other Findings:  None visualized. IMPRESSION: No evidence of DVT within the left upper extremity. Electronically Signed   By: Odessa Fleming M.D.   On: 11/19/2022 04:54   CT Cervical Spine Wo Contrast  Result Date: 11/19/2022 CLINICAL DATA:  Neck trauma (Age >= 65y).  Fall EXAM: CT CERVICAL SPINE WITHOUT CONTRAST TECHNIQUE: Multidetector CT imaging of the cervical spine was performed without intravenous contrast. Multiplanar CT image reconstructions were also generated. RADIATION DOSE REDUCTION: This exam was performed according to the departmental dose-optimization program which includes automated exposure control, adjustment of the mA and/or kV according to patient size and/or use of iterative reconstruction technique. COMPARISON:   12/02/2021 FINDINGS: Alignment: 3 mm of anterolisthesis of C3 on C4 and C7 on T1. Skull base and vertebrae: No acute fracture. No primary bone lesion or focal pathologic process. Soft tissues and spinal canal: No prevertebral fluid or swelling. No visible canal hematoma. Disc levels: Diffuse advanced degenerative disc disease and moderate bilateral degenerative facet disease. Upper chest: No acute findings Other: None IMPRESSION: Moderate to advanced degenerative disc and facet disease. No acute bony abnormality. Electronically Signed   By: Charlett Nose M.D.   On: 11/19/2022 03:03   CT Head Wo Contrast  Result Date: 11/19/2022  CLINICAL DATA:  Head trauma, minor (Age >= 65y), fall EXAM: CT HEAD WITHOUT CONTRAST TECHNIQUE: Contiguous axial images were obtained from the base of the skull through the vertex without intravenous contrast. RADIATION DOSE REDUCTION: This exam was performed according to the departmental dose-optimization program which includes automated exposure control, adjustment of the mA and/or kV according to patient size and/or use of iterative reconstruction technique. COMPARISON:  12/02/2021 FINDINGS: Brain: No acute intracranial abnormality. Specifically, no hemorrhage, hydrocephalus, mass lesion, acute infarction, or significant intracranial injury. Mild age related atrophy. Vascular: No hyperdense vessel or unexpected calcification. Skull: No acute calvarial abnormality. Sinuses/Orbits: No acute findings Other: None IMPRESSION: No acute intracranial abnormality. Electronically Signed   By: Charlett Nose M.D.   On: 11/19/2022 03:01   DG Wrist Complete Left  Result Date: 11/19/2022 CLINICAL DATA:  Fall, left wrist injury and swelling EXAM: LEFT WRIST - COMPLETE 3+ VIEW COMPARISON:  None Available. FINDINGS: Normal alignment. No acute fracture or dislocation. Joint spaces are preserved. Degenerative chondrocalcinosis of the triangular fibrocartilage complex. There is diffuse soft tissue  swelling surrounding the left wrist. IMPRESSION: 1. Diffuse soft tissue swelling. No acute fracture or dislocation. Electronically Signed   By: Helyn Numbers M.D.   On: 11/19/2022 02:40   DG Hand Complete Left  Result Date: 11/19/2022 CLINICAL DATA:  Left hand and wrist swelling following fall and left hand injury EXAM: LEFT HAND - COMPLETE 3+ VIEW COMPARISON:  None Available. FINDINGS: The fingers are held in fixed flexion on all presented images. No acute fracture or dislocation. Degenerative calcification of the triangular fibrocartilage complex. Extensive diffuse soft tissue swelling of the left hand and wrist noted. IMPRESSION: 1. Extensive diffuse soft tissue swelling. No acute fracture or dislocation. Electronically Signed   By: Helyn Numbers M.D.   On: 11/19/2022 02:39               LOS: 1 day   Kaylia Winborne  [facility]   Pager on [email or web address]. If 7PM-7AM, please contact night-coverage at [email or web address]     11/20/2022, 4:58 PM

## 2022-11-20 NOTE — Anesthesia Procedure Notes (Signed)
Procedure Name: LMA Insertion Date/Time: 11/20/2022 11:08 AM  Performed by: Ginger Carne, CRNAPre-anesthesia Checklist: Patient identified, Emergency Drugs available, Suction available, Patient being monitored and Timeout performed Patient Re-evaluated:Patient Re-evaluated prior to induction Oxygen Delivery Method: Circle system utilized Preoxygenation: Pre-oxygenation with 100% oxygen Induction Type: IV induction LMA: LMA inserted LMA Size: 4.0 Tube type: Oral Number of attempts: 1 Placement Confirmation: positive ETCO2 and breath sounds checked- equal and bilateral Tube secured with: Tape Dental Injury: Teeth and Oropharynx as per pre-operative assessment

## 2022-11-20 NOTE — Progress Notes (Signed)
Ortho Hand   I had spoken with Dr. Audelia Acton yesterday about medicine to medicine transfer of this patient to [facility]. Unfortunately, no beds are available at [facility] as it is full and so patient has not been able to be transferred.  This was not known last night when Dr. Audelia Acton and I discussed the transfer.  I spoke with Dr. Audelia Acton this morning and the patient will not be transferred at this time.  He will plan to take the patient to the OR for wash out.  Please re-consult the on-call hand provider if further questions rise.   Shaune Pollack, MD Hand & Upper Extremity Surgery [facility] [phone]

## 2022-11-20 NOTE — Progress Notes (Signed)
Attempted to introduce patient to role of nurse navigator. Patient unavailable at this time.

## 2022-11-20 NOTE — Progress Notes (Signed)
A documentation error occurred during the insulin administration at 02am . Only 1 unit was given to the patient for the BG of 177.

## 2022-11-20 NOTE — Progress Notes (Signed)
PT Cancellation Note  Patient Details Name: SYVANNAH PRIVE MRN: [id number] DOB: 1950-10-25   Cancelled Treatment:    Reason Eval/Treat Not Completed: Other (comment). Orders Received. Chart Reviewed. Patient scheduled to have surgery on 11/8. Will re-attempt at later date/time as medically appropriate.    Howie Ill, PT, DPT 11/20/22 11:42 AM

## 2022-11-20 NOTE — Progress Notes (Signed)
Subjective: Patient seen and examined at bedside this morning.  She continues to have severe swelling in pain in her left hand and no improvement in her subjective or objective clinical exam.  She reports intact sensation over her left hand.   Objective: Vital signs in last 24 hours: Temp:  [98.2 F (36.8 C)-99.9 F (37.7 C)] 98.3 F (36.8 C) (11/08 1023) Pulse Rate:  [60-87] 76 (11/08 1023) Resp:  [11-20] 16 (11/08 0842) BP: (124-160)/(37-56) 126/56 (11/08 1023) SpO2:  [90 %-100 %] 97 % (11/08 1023) Weight:  [52.6 kg] 52.6 kg (11/08 1023)  Intake/Output from previous day: No intake/output data recorded. Intake/Output this shift: No intake/output data recorded.  Recent Labs    11/19/22 0222 11/20/22 0347  HGB 12.2 13.1   Recent Labs    11/19/22 0222 11/20/22 0347  WBC 10.0 7.5  RBC 4.23 4.58  HCT 37.3 39.2  PLT 225 189   Recent Labs    11/19/22 0222 11/20/22 0347  NA 131* 130*  K 3.9 3.7  CL 100 98  CO2 24 23  BUN 34* 18  CREATININE 0.64 0.61  GLUCOSE 36* 158*  CALCIUM 8.8* 7.8*   No results for input(s): "LABPT", "INR" in the last 72 hours.  Physical Exam: Left upper extremity exam Continues to be significant swelling warmth erythema over the volar and dorsal aspect of the entire hand and wrist extending to the distal third of the forearm and all 5 fingers.  With fusiform swelling of all 5 fingers with tenderness over the flexor tendon sheaths as well as the carpal tunnel and distal volar wrist.  Positive for knavel signs at this time. All compartments remain soft Intact radial pulse and brisk capillary refill AIN/PIN/M/ R/U intact on exam  X-rays: X-rays and MRI reviewed reads noted above there is a large fluid collection over the volar distal radius deep to the flexor tendons with fluid noted in all 5 flexor tendon sheaths up to the distal metacarpal shafts. There are some limitations to the study as there is no axial T2 cuts. Agree with radiologist  interpretation.   Assessment: Left hand swelling, flexor tenosynovitis, deep fluid collection over the distal radius  Plan: I reviewed the case and imaging again this morning with Dr. Kerry Fort and he does recommend incision and drainage with irrigation and debridement of the distal radial abscess as well as open release of the carpal tunnel and exploration of the flexor tendon sheaths.  Should there be infection coming distally from the flexor tendon sheaths he would recommend distal incisions if possible to washout the flexor tendon sheaths.  Given inability to transfer the patient we will plan to take the patient to the operating room today for urgent washout.  I reviewed all of these findings with the patient and she agrees with the plan to move to the operating room for treatment.  We discussed the risk of damage to nerves, vessels, ligaments, and additional structures within the hand.  We discussed the potential need for repeat washouts or further debridements after the surgery and that there is still a possibility that we may need to transfer if even further debridements by hand surgeon may be needed.  We will plan to obtain cultures during the case for guidance of antibiotic treatment.  We did discuss that there is the possibility that we encountered blood rather than infection and would still plan to washout the area with an open debridement and place a drain at the end.  Patient agrees with  this above plan and under shared decision making model we will move forward with surgery.  Consent was filled out at this time.   Suzanne Snyder 11/20/2022, 10:41 AM

## 2022-11-21 ENCOUNTER — Encounter: Payer: Self-pay | Admitting: Orthopedic Surgery

## 2022-11-21 DIAGNOSIS — M65142 Other infective (teno)synovitis, left hand: Secondary | ICD-10-CM

## 2022-11-21 DIAGNOSIS — E162 Hypoglycemia, unspecified: Secondary | ICD-10-CM | POA: Diagnosis not present

## 2022-11-21 LAB — CBC WITH DIFFERENTIAL/PLATELET
Abs Immature Granulocytes: 0.06 10*3/uL (ref 0.00–0.07)
Basophils Absolute: 0 10*3/uL (ref 0.0–0.1)
Basophils Relative: 0 %
Eosinophils Absolute: 0 10*3/uL (ref 0.0–0.5)
Eosinophils Relative: 0 %
HCT: 34.5 % — ABNORMAL LOW (ref 36.0–46.0)
Hemoglobin: 11.3 g/dL — ABNORMAL LOW (ref 12.0–15.0)
Immature Granulocytes: 0 %
Lymphocytes Relative: 8 %
Lymphs Abs: 1.2 10*3/uL (ref 0.7–4.0)
MCH: 29 pg (ref 26.0–34.0)
MCHC: 32.8 g/dL (ref 30.0–36.0)
MCV: 88.7 fL (ref 80.0–100.0)
Monocytes Absolute: 1 10*3/uL (ref 0.1–1.0)
Monocytes Relative: 7 %
Neutro Abs: 12.2 10*3/uL — ABNORMAL HIGH (ref 1.7–7.7)
Neutrophils Relative %: 85 %
Platelets: 211 10*3/uL (ref 150–400)
RBC: 3.89 MIL/uL (ref 3.87–5.11)
RDW: 15.2 % (ref 11.5–15.5)
WBC: 14.5 10*3/uL — ABNORMAL HIGH (ref 4.0–10.5)
nRBC: 0 % (ref 0.0–0.2)

## 2022-11-21 LAB — BASIC METABOLIC PANEL
Anion gap: 13 (ref 5–15)
BUN: 38 mg/dL — ABNORMAL HIGH (ref 8–23)
CO2: 19 mmol/L — ABNORMAL LOW (ref 22–32)
Calcium: 6.9 mg/dL — ABNORMAL LOW (ref 8.9–10.3)
Chloride: 101 mmol/L (ref 98–111)
Creatinine, Ser: 0.77 mg/dL (ref 0.44–1.00)
GFR, Estimated: >60 mL/min (ref >60)
Glucose, Bld: 281 mg/dL — ABNORMAL HIGH (ref 70–99)
Potassium: 4 mmol/L (ref 3.5–5.1)
Sodium: 133 mmol/L — ABNORMAL LOW (ref 135–145)

## 2022-11-21 LAB — GLUCOSE, CAPILLARY
Glucose-Capillary: 231 mg/dL — ABNORMAL HIGH (ref 70–99)
Glucose-Capillary: 241 mg/dL — ABNORMAL HIGH (ref 70–99)
Glucose-Capillary: 245 mg/dL — ABNORMAL HIGH (ref 70–99)
Glucose-Capillary: 258 mg/dL — ABNORMAL HIGH (ref 70–99)
Glucose-Capillary: 275 mg/dL — ABNORMAL HIGH (ref 70–99)
Glucose-Capillary: 278 mg/dL — ABNORMAL HIGH (ref 70–99)
Glucose-Capillary: 296 mg/dL — ABNORMAL HIGH (ref 70–99)
Glucose-Capillary: 318 mg/dL — ABNORMAL HIGH (ref 70–99)

## 2022-11-21 LAB — PHOSPHORUS: Phosphorus: 2.6 mg/dL (ref 2.5–4.6)

## 2022-11-21 LAB — ALBUMIN: Albumin: 3 g/dL — ABNORMAL LOW (ref 3.5–5.0)

## 2022-11-21 MED ORDER — INSULIN GLARGINE-YFGN 100 UNIT/ML ~~LOC~~ SOLN
8.0000 [IU] | Freq: Every day | SUBCUTANEOUS | Status: DC
  Filled 2022-11-21 (×2): qty 0.08

## 2022-11-21 MED ORDER — INSULIN GLARGINE-YFGN 100 UNIT/ML ~~LOC~~ SOLN
10.0000 [IU] | Freq: Every day | SUBCUTANEOUS | Status: DC
  Administered 2022-11-21 – 2022-11-23 (×3): 10 [IU] via SUBCUTANEOUS
  Filled 2022-11-21 (×4): qty 0.1

## 2022-11-21 MED ORDER — ENOXAPARIN SODIUM 60 MG/0.6ML IJ SOSY
50.0000 mg | PREFILLED_SYRINGE | Freq: Two times a day (BID) | INTRAMUSCULAR | Status: DC
  Administered 2022-11-21 – 2022-11-24 (×7): 50 mg via SUBCUTANEOUS
  Filled 2022-11-21 (×7): qty 0.6

## 2022-11-21 MED ORDER — GABAPENTIN 300 MG PO CAPS
300.0000 mg | ORAL_CAPSULE | Freq: Three times a day (TID) | ORAL | Status: DC
  Administered 2022-11-21 – 2022-11-26 (×16): 300 mg via ORAL
  Filled 2022-11-21 (×16): qty 1

## 2022-11-21 MED ORDER — CYCLOBENZAPRINE HCL 10 MG PO TABS
10.0000 mg | ORAL_TABLET | Freq: Three times a day (TID) | ORAL | Status: DC | PRN
  Administered 2022-11-25 – 2022-11-26 (×4): 10 mg via ORAL
  Filled 2022-11-21 (×4): qty 1

## 2022-11-21 MED ORDER — INSULIN ASPART 100 UNIT/ML IJ SOLN
0.0000 [IU] | Freq: Every day | INTRAMUSCULAR | Status: DC
  Administered 2022-11-21 – 2022-11-24 (×3): 3 [IU] via SUBCUTANEOUS
  Administered 2022-11-25: 5 [IU] via SUBCUTANEOUS
  Filled 2022-11-21 (×4): qty 1

## 2022-11-21 MED ORDER — POLYETHYLENE GLYCOL 3350 17 G PO PACK
17.0000 g | PACK | Freq: Every day | ORAL | Status: DC | PRN
  Administered 2022-11-24: 17 g via ORAL
  Filled 2022-11-21: qty 1

## 2022-11-21 MED ORDER — ROPINIROLE HCL 1 MG PO TABS
2.0000 mg | ORAL_TABLET | Freq: Every day | ORAL | Status: DC
  Administered 2022-11-21 – 2022-11-25 (×5): 2 mg via ORAL
  Filled 2022-11-21 (×5): qty 2

## 2022-11-21 MED ORDER — DOCUSATE SODIUM 100 MG PO CAPS
100.0000 mg | ORAL_CAPSULE | Freq: Every day | ORAL | Status: DC
  Administered 2022-11-21: 100 mg via ORAL
  Filled 2022-11-21: qty 1

## 2022-11-21 MED ORDER — ACETAMINOPHEN 325 MG PO TABS: 650.0000 mg | ORAL_TABLET | Freq: Four times a day (QID) | ORAL | Status: DC | PRN

## 2022-11-21 MED ORDER — INSULIN ASPART 100 UNIT/ML IJ SOLN
0.0000 [IU] | Freq: Three times a day (TID) | INTRAMUSCULAR | Status: DC
  Administered 2022-11-21: 7 [IU] via SUBCUTANEOUS
  Administered 2022-11-21: 5 [IU] via SUBCUTANEOUS
  Administered 2022-11-22 (×2): 2 [IU] via SUBCUTANEOUS
  Administered 2022-11-22: 1 [IU] via SUBCUTANEOUS
  Administered 2022-11-23: 7 [IU] via SUBCUTANEOUS
  Administered 2022-11-23: 1 [IU] via SUBCUTANEOUS
  Administered 2022-11-24: 5 [IU] via SUBCUTANEOUS
  Administered 2022-11-24 – 2022-11-25 (×5): 3 [IU] via SUBCUTANEOUS
  Administered 2022-11-26: 5 [IU] via SUBCUTANEOUS
  Administered 2022-11-26: 9 [IU] via SUBCUTANEOUS
  Filled 2022-11-21 (×15): qty 1

## 2022-11-21 MED ORDER — DOCUSATE SODIUM 100 MG PO CAPS
100.0000 mg | ORAL_CAPSULE | Freq: Two times a day (BID) | ORAL | Status: DC
Start: 2022-11-21 — End: 2022-11-26
  Administered 2022-11-21 – 2022-11-26 (×10): 100 mg via ORAL
  Filled 2022-11-21 (×10): qty 1

## 2022-11-21 MED ORDER — CYCLOBENZAPRINE HCL 10 MG PO TABS
5.0000 mg | ORAL_TABLET | Freq: Every day | ORAL | Status: DC
  Administered 2022-11-21 – 2022-11-25 (×5): 5 mg via ORAL
  Filled 2022-11-21 (×5): qty 1

## 2022-11-21 MED ORDER — IBUPROFEN 400 MG PO TABS
200.0000 mg | ORAL_TABLET | Freq: Once | ORAL | Status: AC
  Administered 2022-11-21: 200 mg via ORAL
  Filled 2022-11-21: qty 1

## 2022-11-21 NOTE — Progress Notes (Signed)
Subjective: Patient seen and examined at bedside this morning.  The overall pain and swelling in her left hand has improved.  Patient states that she is at a 3/10 pain scale today. She states that she has full sensation in her fingertips and is able to flex and extend her fingertips with increased discomfort, but able to perform function. Denies any fevers, chills, N/V, SOB or CP. Patient states that she takes pramipexole at home and would like to have this medication started.  She also states that her neck has been tight.  Objective: Vital signs in last 24 hours: Temp:  [97.6 F (36.4 C)-98.8 F (37.1 C)] 98.3 F (36.8 C) (11/09 0722) Pulse Rate:  [68-81] 70 (11/09 0722) Resp:  [7-20] 20 (11/08 1934) BP: (110-132)/(43-88) 110/43 (11/09 0722) SpO2:  [89 %-100 %] 95 % (11/09 1022)  Intake/Output from previous day: 11/08 0701 - 11/09 0700 In: 1083.3 [P.O.:90; I.V.:350; IV Piggyback:643.3] Out: 15 [Blood:15] Intake/Output this shift: No intake/output data recorded.  Recent Labs    11/19/22 0222 11/20/22 0347 11/21/22 0822  HGB 12.2 13.1 11.3*   Recent Labs    11/20/22 0347 11/21/22 0822  WBC 7.5 14.5*  RBC 4.58 3.89  HCT 39.2 34.5*  PLT 189 211   Recent Labs    11/20/22 0347 11/21/22 0822  NA 130* 133*  K 3.7 4.0  CL 98 101  CO2 23 19*  BUN 18 38*  CREATININE 0.61 0.77  GLUCOSE 158* 281*  CALCIUM 7.8* 6.9*   No results for input(s): "LABPT", "INR" in the last 72 hours.  Physical Exam: Left upper extremity exam After removal of the patient's dressing, there is some appreciable erythema and swelling, that is greatly improved from her preoperative baseline.  Swelling most appreciated over the volar aspect of the wrist extending down into the thenar eminence.  Able to flex and extend the wrist with decreased ROM and slightly increased pain.  Able to flex and extend her fingers with mildly increased discomfort, noted flexion and extension at all DIP and PIP  joints.  Noted to have some mild bloody and questionable drainage from Penrose drain, drain remains in place.   New dressing was applied using Xeroform gauze, gauze wrap, ABD pad and Ace wrap.  All compartments remain soft Intact radial pulse and brisk capillary refill  Assessment: S/p incision and drainage of left wrist and hand  Left hand swelling, flexor tenosynovitis, deep fluid collection over the distal radius  Plan:  Continue to monitor the incision as well as overall inflammation and erythema, looks to be improved from preoperative baseline.  CBC shows elevated white count at 14.5, hemoglobin at 11.3.  Vital signs stable  Penrose drain remains in place, new dressing applied  Message has been sent to inpatient medicine about starting patient on pramipexole  Patient was started on cyclobenzaprine to help with muscle spasms of the wrist and neck.  Continue with ceftriaxone IV antibiotics, cultures pending  Continue with as needed pain meds for management, well-controlled  Josh Jamar Casagrande 11/21/2022, 10:30 AM

## 2022-11-21 NOTE — Plan of Care (Signed)

## 2022-11-21 NOTE — Evaluation (Signed)
Physical Therapy Evaluation Patient Details Name: Suzanne Snyder MRN: [id number] DOB: 08-31-1950 Today's Date: 11/21/2022  History of Present Illness  Per MD Notes: Suzanne Snyder is a 72 year old female presenting to the emergency department for evaluation of hand pain.  In triage, patient reported that she fell out of her bed last night and injured her hand.  She does report that she hit her head.  Was noted to be confused in triage, so blood sugar was obtained which returned low at 36. CT of hand/wrist showed extensive diffuse soft tissue swelling. No acute fracture or dislocation. Head CT was negative for acute abnormality. She is s/p Left volar wrist abscess I&D left carpal tunnel release left hand flexor tendons irrigation and debridement on 11/21/2022. Patient is NWB in LUE.  Clinical Impression  72 yo female was admitted to the hospital after experiencing a fall with subsequent infection to left hand. Patient is s/p left volar wrist abscess I&D, left carpal tunnel release and flexor tendon irrigation on 11/21/2022. She was living at home alone prior to admittance. Patient expressed anxiety throughout evaluation regarding home situation. She reports her son in law brought her to the ED and left her (taking her car). She does not have her cell phone and does not have any phone numbers to get in touch with family. At end of PT evaluation, daughter did call and speak with patient and is planning to come to hospital to bring her phone and keys. Pt unsure if daughter/SIL will be able to assist her with ADLs. She has a neighbor across the street that she has started talking with. She believes the neighbor would be able to check in on her from time to time. Patient is NWB in LUE. She is right hand dominant. She requires supervision for bed mobility, min A for sit<>Stand transfers and min A for ambulation. She was able to walk in hallway x100 feet with min A demonstrating reciprocal gait, narrow base of  support and slightly decreased gait speed. Pt able to maintain NWB in LUE well throughout PT evaluation. Patient would benefit from additional skilled PT intervention to improve strength, balance and mobility.         If plan is discharge home, recommend the following: A little help with walking and/or transfers;Assistance with cooking/housework;Assist for transportation;Help with stairs or ramp for entrance;A little help with bathing/dressing/bathroom   Can travel by private vehicle        Equipment Recommendations None recommended by PT  Recommendations for Other Services       Functional Status Assessment Patient has had a recent decline in their functional status and demonstrates the ability to make significant improvements in function in a reasonable and predictable amount of time.     Precautions / Restrictions Precautions Precautions: Fall Precaution Comments: LUE very swollen, pain limited. LUE NWB Restrictions Weight Bearing Restrictions: Yes LUE Weight Bearing: Non weight bearing Other Position/Activity Restrictions: NWB through wrist/hand      Mobility  Bed Mobility Overal bed mobility: Needs Assistance Bed Mobility: Supine to Sit, Sit to Supine     Supine to sit: Supervision Sit to supine: Supervision   General bed mobility comments: requires supervision for safety and assistance to manage IV line and linens. Patient Response: Anxious, Cooperative  Transfers Overall transfer level: Needs assistance Equipment used: 1 person hand held assist Transfers: Sit to/from Stand Sit to Stand: Min assist           General transfer comment: requires min A  for safety with sit to stand, able to maintain NWB in LUE    Ambulation/Gait Ambulation/Gait assistance: Min assist Gait Distance (Feet): 75 Feet Assistive device: None Gait Pattern/deviations: Step-through pattern, Narrow base of support Gait velocity: slightly decreased     General Gait Details: pt  ambulates with narrow base of support, requires min A for steadying, able to ambulate without assistive device, slightly slower gait speed  Stairs            Wheelchair Mobility       Modified Rankin (Stroke Patients Only)       Balance Overall balance assessment: Needs assistance Sitting-balance support: Feet supported, Single extremity supported Sitting balance-Leahy Scale: Fair     Standing balance support: During functional activity, No upper extremity supported Standing balance-Leahy Scale: Fair Standing balance comment: able to ambulate on level surface without assistive device with CGA to min A for safety; able to stand unsupported, no imbalance                             Pertinent Vitals/Pain Pain Assessment Pain Assessment: 0-10 Pain Score: 8  Pain Location: neck/back; reports 5/10 LUE wrist pain Pain Descriptors / Indicators: Aching, Sore Pain Intervention(s): Limited activity within patient's tolerance, Monitored during session, Repositioned    Home Living Family/patient expects to be discharged to:: Private residence Living Arrangements: Alone   Type of Home: House Home Access: Stairs to enter Entrance Stairs-Rails: Right;Left;Can reach both Entrance Stairs-Number of Steps: 4 steps with L railing from garage entrance; 6 steps with B railings (unable to reach both at same time) from front entrance   Home Layout: One level Home Equipment: Agricultural consultant (2 wheels);Rollator (4 wheels);Grab bars - tub/shower;Hand held shower head;BSC/3in1;Wheelchair - manual;Shower seat - built in;Grab bars - toilet Additional Comments: bed rail; stool with rail (to get in/out of higher bed); 2 walking sticks    Prior Function Prior Level of Function : Independent/Modified Independent;Driving             Mobility Comments: Pt reports she would walk around home without assistive device, holding onto furniture, would use walking sticks as needed but mostly  when out of the home. ADLs Comments: Ind with self care and IADLs;     Extremity/Trunk Assessment        Lower Extremity Assessment Lower Extremity Assessment: Generalized weakness (numbness/tingling (neuropathy) in lower legs; strength grossly 4/5)    Cervical / Trunk Assessment Cervical / Trunk Assessment: Kyphotic  Communication   Communication Communication: No apparent difficulties Cueing Techniques: Verbal cues  Cognition Arousal: Alert Behavior During Therapy: Restless, Anxious, Lability Overall Cognitive Status: No family/caregiver present to determine baseline cognitive functioning                                 General Comments: Pt expressed concern over daughter/SIL and being unable to reach them. Does not have her cellphone and does not know SIL phone number; Concerned about home situation        General Comments General comments (skin integrity, edema, etc.): exhibits bandaged LUE wrist/hand, appears swollen    Exercises     Assessment/Plan    PT Assessment Patient needs continued PT services  PT Problem List Decreased strength;Pain;Decreased activity tolerance;Decreased knowledge of use of DME;Decreased balance;Decreased safety awareness;Decreased mobility       PT Treatment Interventions DME instruction;Balance training;Gait training;Functional mobility training;Stair training;Patient/family  education;Therapeutic activities;Therapeutic exercise    PT Goals (Current goals can be found in the Care Plan section)  Acute Rehab PT Goals Patient Stated Goal: to go home and get better PT Goal Formulation: With patient Time For Goal Achievement: 12/05/22 Potential to Achieve Goals: Good    Frequency Min 1X/week     Co-evaluation               AM-PAC PT "6 Clicks" Mobility  Outcome Measure Help needed turning from your back to your side while in a flat bed without using bedrails?: None Help needed moving from lying on your back to  sitting on the side of a flat bed without using bedrails?: None Help needed moving to and from a bed to a chair (including a wheelchair)?: A Little Help needed standing up from a chair using your arms (e.g., wheelchair or bedside chair)?: A Little Help needed to walk in hospital room?: A Little Help needed climbing 3-5 steps with a railing? : A Little 6 Click Score: 20    End of Session Equipment Utilized During Treatment: Gait belt Activity Tolerance: Patient limited by pain Patient left: in bed;with call bell/phone within reach;with bed alarm set Nurse Communication: Mobility status PT Visit Diagnosis: Muscle weakness (generalized) (M62.81);Other abnormalities of gait and mobility (R26.89)    Time: 1610-9604 PT Time Calculation (min) (ACUTE ONLY): 38 min   Charges:   PT Evaluation $PT Eval Low Complexity: 1 Low   PT General Charges $$ ACUTE PT VISIT: 1 Visit         Esther Bradstreet PT, DPT 11/21/2022, 12:46 PM

## 2022-11-21 NOTE — TOC Initial Note (Signed)
Transition of Care [facility]) - Initial/Assessment Note    Patient Details  Name: Suzanne Snyder MRN: [id number] Date of Birth: April 25, 1950  Transition of Care [facility] CM/SW Contact:    Kemper Durie, RN Phone Number: 11/21/2022, 4:22 PM  Clinical Narrative:                  Met with patient at bedside, lives alone and denies having additional support in the area.  State husband died of Alzheimers almost a year ago, she was his caregiver.  Aware of recommendations for HHPT/OT, but state she is still waiting for providers to make a decision as to if she will transfer to [facility] for further intervention on her hand.  If she does not transfer, she agrees to recommendations and prefers to have [facility] for home health.  She would like follow up tomorrow to determine if she will transfer or go home.   Expected Discharge Plan: Home w Home Health Services Barriers to Discharge: Continued Medical Work up   Patient Goals and CMS Choice            Expected Discharge Plan and Services     Post Acute Care Choice: Home Health Living arrangements for the past 2 months: Single Family Home Expected Discharge Date: 11/19/22                                    Prior Living Arrangements/Services Living arrangements for the past 2 months: Single Family Home Lives with:: Self                   Activities of Daily Living   ADL Screening (condition at time of admission) Independently performs ADLs?: No Does the patient have a NEW difficulty with bathing/dressing/toileting/self-feeding that is expected to last >3 days?: No Does the patient have a NEW difficulty with getting in/out of bed, walking, or climbing stairs that is expected to last >3 days?: No Does the patient have a NEW difficulty with communication that is expected to last >3 days?: No Is the patient deaf or have difficulty hearing?: No Does the patient have difficulty seeing, even when wearing glasses/contacts?: No Does  the patient have difficulty concentrating, remembering, or making decisions?: Yes  Permission Sought/Granted                  Emotional Assessment Appearance:: Appears older than stated age Attitude/Demeanor/Rapport: Engaged Affect (typically observed): Calm Orientation: : Oriented to Self, Oriented to Place, Oriented to  Time, Oriented to Situation   Psych Involvement: No (comment)  Admission diagnosis:  Hypoglycemia [E16.2] Cellulitis of right hand [L03.113] Patient Active Problem List   Diagnosis Date Noted   Acute metabolic encephalopathy 11/19/2022   Pain and swelling of left wrist 11/19/2022   Fall 11/19/2022   Suppurative tenosynovitis of flexor tendon of left hand 11/19/2022   Rib pain on left side 08/25/2022   SBO (small bowel obstruction) (HCC) 05/06/2022   Small bowel obstruction (HCC) 05/06/2022   Portal vein obstruction 05/06/2022   Right hip pain 02/14/2022   Hip pain, acute, right 02/13/2022   Electrolyte abnormality 02/13/2022   Anemia 02/13/2022   Malnutrition of moderate degree 02/10/2022   Closed right hip fracture (HCC) 02/08/2022   Hypomagnesemia 02/08/2022   Cellulitis of right ankle 02/08/2022   Hyponatremia 02/08/2022   Tobacco abuse 02/08/2022   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) 02/08/2022  Lumbar spondylosis 11/27/2021   Lumbar degenerative disc disease 11/27/2021   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 [place] [address] Phone: [phone] Fax: [phone]  [facility] - [place], [place] - [address] [place] [address] Phone: [phone] Fax: [phone]     Social Determinants of Health (SDOH) Social History: SDOH Screenings   Food Insecurity: No Food Insecurity (11/19/2022)  Housing: Low Risk  (11/19/2022)  Transportation Needs: No Transportation Needs (11/19/2022)  Utilities: Not At Risk (11/19/2022)  Depression (PHQ2-9): Low Risk  (03/10/2022)  Financial Resource Strain: Low Risk  (07/01/2021)   Received from [facility], [facility]  Physical Activity: Inactive (07/01/2021)   Received from [facility], [facility]  Social Connections: Socially Isolated (07/01/2021)   Received from [facility], [facility]  Stress: Stress Concern Present (07/01/2021)   Received from [facility], [facility]  Tobacco Use: High Risk (11/20/2022)   SDOH Interventions:     Readmission Risk Interventions    05/07/2022    2:44 PM  Readmission Risk Prevention Plan  Transportation Screening Complete  Medication Review (RN Care Manager) Complete  HRI or Home Care Consult Complete  SW Recovery Care/Counseling Consult Complete  Palliative Care Screening Not Applicable

## 2022-11-21 NOTE — Progress Notes (Signed)
Progress Note    Suzanne Snyder  ZOX:[id number] DOB: 1950-10-12  DOA: 11/19/2022 PCP: Gracelyn Nurse, MD      Brief Narrative:    Medical records reviewed and are as summarized below:  Suzanne Snyder is a 72 y.o. female with medical history significant for type 1 diabetes mellitus, asthma, tobacco use disorder, peripheral neuropathy, chronic pain, portal vein thrombosis on Eliquis, who presented to the hospital because of painful swelling of the left hand/wrist following a fall at home.  Her glucose was 36 in the emergency department so her insulin pump was taken off.  She was also found to have left hand abscess and left hand flexor tenosynovitis.    Assessment/Plan:   Principal Problem:   Hypoglycemia Active Problems:   Pain and swelling of left wrist   Acute metabolic encephalopathy   Fall   Portal vein obstruction   Suppurative tenosynovitis of flexor tendon of left hand     Body mass index is 17.63 kg/m.   Left volar wrist abscess, left hand suppurative flexor tenosynovitis: S/p I&D of left volar wrist abscess, left carpal tunnel release, left hand and flexor tendon irrigation and debridement on 11/20/2022. Continue IV vancomycin and ceftriaxone.  Analgesics as needed for pain.  Surgical wound cultures are pending.  Follow-up with orthopedic surgeon.     Insulin-dependent diabetes mellitus with hyperglycemia, s/p hypoglycemia: Insulin pump has been held.  Increase insulin glargine from 6 units to 10 units daily.  Increase NovoLog sliding scale from very sensitive to sensitive scale.  Glucose levels closely and adjust insulin therapy as needed.     Acute metabolic encephalopathy: Improved   S/p fall: PT and OT evaluation   Portal vein thrombosis: She was on Eliquis prior to admission.  Plan to resume Eliquis when okay with orthopedic surgeon.   Restless leg syndrome: Resume Requip   Chronic neck and back pain: Analgesics as needed for pain  Diet  Order             Diet regular Fluid consistency: Thin  Diet effective now                            Consultants: Orthopedic surgeon  Procedures: I&D of left volar wrist abscess, left carpal tunnel release, left hand and flexor tendon irrigation and debridement on 11/20/2022    Medications:    cyclobenzaprine  5 mg Oral QHS   docusate sodium  100 mg Oral Daily   gabapentin  300 mg Oral TID   insulin aspart  0-5 Units Subcutaneous QHS   insulin aspart  0-9 Units Subcutaneous TID WC   insulin glargine-yfgn  10 Units Subcutaneous Daily    morphine injection  2 mg Intravenous Once   rOPINIRole  2 mg Oral QHS   Continuous Infusions:  cefTRIAXone (ROCEPHIN)  IV Stopped (11/21/22 0545)   vancomycin 1,000 mg (11/21/22 0804)     Anti-infectives (From admission, onward)    Start     Dose/Rate Route Frequency Ordered Stop   11/20/22 0900  vancomycin (VANCOCIN) IVPB 1000 mg/200 mL premix        1,000 mg 200 mL/hr over 60 Minutes Intravenous Every 24 hours 11/19/22 0825     11/19/22 0845  vancomycin (VANCOREADY) IVPB 1250 mg/250 mL        1,250 mg 166.7 mL/hr over 90 Minutes Intravenous  Once 11/19/22 0812 11/19/22 1312   11/19/22 0545  cefTRIAXone (ROCEPHIN)  2 g in sodium chloride 0.9 % 100 mL IVPB        2 g 200 mL/hr over 30 Minutes Intravenous Every 24 hours 11/19/22 0541 11/26/22 0544              Family Communication/Anticipated D/C date and plan/Code Status   DVT prophylaxis:      Code Status: Full Code  Family Communication: None Disposition Plan: Plan to discharge home   Status is: Inpatient Remains inpatient appropriate because: On IV antibiotics for left hand infection       Subjective:   Interval events noted.  She still has pain in the left arm but is better compared to yesterday.  She requested a regular diet instead of carb modified diet.  She said with a regular diet, she is able to pick and choose what she wants and  actually gets lower carbohydrate content from regular diet.  She requested increasing ropinirole from 1.5 mg to 2 mg.   Objective:    Vitals:   11/21/22 0359 11/21/22 0722 11/21/22 1022 11/21/22 1028  BP: (!) 110/44 (!) 110/43    Pulse: 71 70    Resp:      Temp: 97.7 F (36.5 C) 98.3 F (36.8 C)    TempSrc:  Oral    SpO2: 96% 97% 95% 95%  Weight:      Height:       No data found.   Intake/Output Summary (Last 24 hours) at 11/21/2022 1158 Last data filed at 11/20/2022 1800 Gross per 24 hour  Intake 640 ml  Output 15 ml  Net 625 ml   Filed Weights   11/19/22 0205 11/20/22 1023  Weight: 52.6 kg 52.6 kg    Exam:  GEN: NAD SKIN: Warm and dry EYES: No pallor or icterus ENT: MMM CV: RRR PULM: CTA B ABD: soft, ND, NT, +BS CNS: AAO x 3, non focal EXT: Dressing on left hand surgical wound is clean, dry and intact       Data Reviewed:   I have personally reviewed following labs and imaging studies:  Labs: Labs show the following:   Basic Metabolic Panel: Recent Labs  Lab 11/19/22 0222 11/20/22 0347 11/21/22 0822  NA 131* 130* 133*  K 3.9 3.7 4.0  CL 100 98 101  CO2 24 23 19*  GLUCOSE 36* 158* 281*  BUN 34* 18 38*  CREATININE 0.64 0.61 0.77  CALCIUM 8.8* 7.8* 6.9*   GFR Estimated Creatinine Clearance: 54.3 mL/min (by C-G formula based on SCr of 0.77 mg/dL). Liver Function Tests: Recent Labs  Lab 11/19/22 0222 11/20/22 0347  AST 22 13*  ALT 20 16  ALKPHOS 58 68  BILITOT 0.4 0.9  PROT 6.9 6.5  ALBUMIN 3.8 3.3*   No results for input(s): "LIPASE", "AMYLASE" in the last 168 hours. Recent Labs  Lab 11/19/22 1809  AMMONIA 32   Coagulation profile No results for input(s): "INR", "PROTIME" in the last 168 hours.  CBC: Recent Labs  Lab 11/19/22 0222 11/20/22 0347 11/21/22 0822  WBC 10.0 7.5 14.5*  NEUTROABS 6.5  --  12.2*  HGB 12.2 13.1 11.3*  HCT 37.3 39.2 34.5*  MCV 88.2 85.6 88.7  PLT 225 189 211   Cardiac Enzymes: No results  for input(s): "CKTOTAL", "CKMB", "CKMBINDEX", "TROPONINI" in the last 168 hours. BNP (last 3 results) No results for input(s): "PROBNP" in the last 8760 hours. CBG: Recent Labs  Lab 11/20/22 1929 11/21/22 0012 11/21/22 0401 11/21/22 0725 11/21/22 1056  GLUCAP 203* 245* 241* 231* 318*   D-Dimer: No results for input(s): "DDIMER" in the last 72 hours. Hgb A1c: Recent Labs    11/19/22 0222  HGBA1C 7.3*   Lipid Profile: No results for input(s): "CHOL", "HDL", "LDLCALC", "TRIG", "CHOLHDL", "LDLDIRECT" in the last 72 hours. Thyroid function studies: No results for input(s): "TSH", "T4TOTAL", "T3FREE", "THYROIDAB" in the last 72 hours.  Invalid input(s): "FREET3" Anemia work up: No results for input(s): "VITAMINB12", "FOLATE", "FERRITIN", "TIBC", "IRON", "RETICCTPCT" in the last 72 hours. Sepsis Labs: Recent Labs  Lab 11/19/22 0222 11/20/22 0347 11/21/22 0822  WBC 10.0 7.5 14.5*    Microbiology Recent Results (from the past 240 hour(s))  Culture, blood (Routine X 2) w Reflex to ID Panel     Status: None (Preliminary result)   Collection Time: 11/19/22  4:15 PM   Specimen: BLOOD  Result Value Ref Range Status   Specimen Description BLOOD BLOOD LEFT ARM  Final   Special Requests   Final    BOTTLES DRAWN AEROBIC ONLY Blood Culture results may not be optimal due to an inadequate volume of blood received in culture bottles   Culture   Final    NO GROWTH 2 DAYS Performed at [facility], [address], [place], [place] [address]    Report Status PENDING  Incomplete  Culture, blood (Routine X 2) w Reflex to ID Panel     Status: None (Preliminary result)   Collection Time: 11/19/22  4:20 PM   Specimen: BLOOD  Result Value Ref Range Status   Specimen Description BLOOD BLOOD LEFT ARM  Final   Special Requests   Final    BOTTLES DRAWN AEROBIC AND ANAEROBIC Blood Culture results may not be optimal due to an inadequate volume of blood received in culture bottles    Culture   Final    NO GROWTH 2 DAYS Performed at [facility], [address] [place], [place] [address]    Report Status PENDING  Incomplete  Aerobic/Anaerobic Culture w Gram Stain (surgical/deep wound)     Status: None (Preliminary result)   Collection Time: 11/20/22 11:38 AM   Specimen: Path fluid; Body Fluid  Result Value Ref Range Status   Specimen Description ABSCESS  Final   Special Requests LEFT WRIST  Final   Gram Stain   Final    FEW WBC PRESENT,BOTH PMN AND MONONUCLEAR NO ORGANISMS SEEN Performed at [facility], [address], [place], [place] [address]    Culture PENDING  Incomplete   Report Status PENDING  Incomplete  Aerobic/Anaerobic Culture w Gram Stain (surgical/deep wound)     Status: None (Preliminary result)   Collection Time: 11/20/22 11:39 AM   Specimen: Path fluid; Body Fluid  Result Value Ref Range Status   Specimen Description WOUND  Final   Special Requests LEFT WRIST  Final   Gram Stain   Final    NO WBC SEEN NO ORGANISMS SEEN Performed at [facility], [address] [place], [place] [address]    Culture PENDING  Incomplete   Report Status PENDING  Incomplete  Aerobic/Anaerobic Culture w Gram Stain (surgical/deep wound)     Status: None (Preliminary result)   Collection Time: 11/20/22 11:51 AM   Specimen: Path fluid; Body Fluid  Result Value Ref Range Status   Specimen Description WOUND  Final   Special Requests LEFT HAND PALM FLEXOR TENDON  Final   Gram Stain   Final    RARE WBC PRESENT,  PREDOMINANTLY PMN NO ORGANISMS SEEN Performed at [facility], [address], [place], [place] [address]    Culture PENDING  Incomplete   Report Status PENDING  Incomplete    Procedures and diagnostic studies:  CT ABDOMEN PELVIS W CONTRAST  Result Date: 11/20/2022 CLINICAL DATA:  Portal vein thrombosis. EXAM: CT ABDOMEN AND PELVIS WITH CONTRAST TECHNIQUE: Multidetector CT imaging of the abdomen and pelvis was performed  using the standard protocol following bolus administration of intravenous contrast. RADIATION DOSE REDUCTION: This exam was performed according to the departmental dose-optimization program which includes automated exposure control, adjustment of the mA and/or kV according to patient size and/or use of iterative reconstruction technique. CONTRAST:  OMNIPAQUE IOHEXOL 300 MG/ML  SOLN COMPARISON:  Abdominal MRI 06/22/2022. CT 05/06/2022 FINDINGS: Lower chest: Dependent atelectasis in the lower lobes. Breathing motion artifact. Hepatobiliary: Post cholecystectomy with unchanged intrahepatic biliary ductal dilatation. The previous right lobe liver lesion is not seen on the current exam. Pancreas: Sequela of chronic pancreatitis with diffuse pancreatic calcifications. Atrophy of the pancreatic tail. No acute peripancreatic inflammation. Spleen: Normal in size without focal abnormality. Adrenals/Urinary Tract: No adrenal nodule. No hydronephrosis or suspicious renal abnormality. Small cyst in the upper left kidney. No further follow-up imaging is recommended. Unremarkable urinary bladder. Stomach/Bowel: Detailed bowel assessment is limited in the absence of enteric contrast and paucity of intra-abdominal fat. Fluid within the distal stomach with mild gastric hyperemia. No evidence of small-bowel obstruction. Moderate to large volume of stool in the colon. Chain sutures in the rectum. The appendix is not visualized. 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 The intrahepatic portal veins are patent. No intraluminal filling defects in the portal vein. There is high-grade narrowing of the splenic vein at the portal splenic confluence but no intraluminal filling defects. The superior mesenteric vein is patent. Moderate aortic atherosclerosis. No abdominopelvic adenopathy. Reproductive: Pessary in place.  No adnexal mass. Other: No ascites.  No free air.  Musculoskeletal: Surgical hardware in both hips. Lumbar scoliosis and degenerative change. No acute osseous findings. IMPRESSION: 1. 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 2. High-grade narrowing of the splenic vein at the portal splenic confluence but no intraluminal filling defects. 3. Sequela of chronic pancreatitis. 4. Moderate to large volume of stool in the colon. 5. The previous right lobe liver lesion is not seen on the current exam. Aortic Atherosclerosis (ICD10-I70.0). Electronically Signed   By: Narda Rutherford M.D.   On: 11/20/2022 02:04               LOS: 2 days   Suzanne Snyder  [facility]   Pager on [email or web address]. If 7PM-7AM, please contact night-coverage at [email or web address]     11/21/2022, 11:58 AM

## 2022-11-22 DIAGNOSIS — E162 Hypoglycemia, unspecified: Secondary | ICD-10-CM | POA: Diagnosis not present

## 2022-11-22 LAB — CBC WITH DIFFERENTIAL/PLATELET
Abs Immature Granulocytes: 0.01 10*3/uL (ref 0.00–0.07)
Basophils Absolute: 0 10*3/uL (ref 0.0–0.1)
Basophils Relative: 0 %
Eosinophils Absolute: 0.1 10*3/uL (ref 0.0–0.5)
Eosinophils Relative: 2 %
HCT: 36.4 % (ref 36.0–46.0)
Hemoglobin: 12.2 g/dL (ref 12.0–15.0)
Immature Granulocytes: 0 %
Lymphocytes Relative: 37 %
Lymphs Abs: 2.1 10*3/uL (ref 0.7–4.0)
MCH: 28.8 pg (ref 26.0–34.0)
MCHC: 33.5 g/dL (ref 30.0–36.0)
MCV: 86.1 fL (ref 80.0–100.0)
Monocytes Absolute: 0.4 10*3/uL (ref 0.1–1.0)
Monocytes Relative: 7 %
Neutro Abs: 3 10*3/uL (ref 1.7–7.7)
Neutrophils Relative %: 54 %
Platelets: 209 10*3/uL (ref 150–400)
RBC: 4.23 MIL/uL (ref 3.87–5.11)
RDW: 15.1 % (ref 11.5–15.5)
WBC: 5.7 10*3/uL (ref 4.0–10.5)
nRBC: 0 % (ref 0.0–0.2)

## 2022-11-22 LAB — BASIC METABOLIC PANEL
Anion gap: 5 (ref 5–15)
BUN: 31 mg/dL — ABNORMAL HIGH (ref 8–23)
CO2: 23 mmol/L (ref 22–32)
Calcium: 6.8 mg/dL — ABNORMAL LOW (ref 8.9–10.3)
Chloride: 105 mmol/L (ref 98–111)
Creatinine, Ser: 0.65 mg/dL (ref 0.44–1.00)
GFR, Estimated: >60 mL/min (ref >60)
Glucose, Bld: 196 mg/dL — ABNORMAL HIGH (ref 70–99)
Potassium: 4.2 mmol/L (ref 3.5–5.1)
Sodium: 133 mmol/L — ABNORMAL LOW (ref 135–145)

## 2022-11-22 LAB — GLUCOSE, CAPILLARY
Glucose-Capillary: 175 mg/dL — ABNORMAL HIGH (ref 70–99)
Glucose-Capillary: 200 mg/dL — ABNORMAL HIGH (ref 70–99)
Glucose-Capillary: 141 mg/dL — ABNORMAL HIGH (ref 70–99)
Glucose-Capillary: 297 mg/dL — ABNORMAL HIGH (ref 70–99)
Glucose-Capillary: 285 mg/dL — ABNORMAL HIGH (ref 70–99)

## 2022-11-22 LAB — ALBUMIN: Albumin: 3 g/dL — ABNORMAL LOW (ref 3.5–5.0)

## 2022-11-22 LAB — PHOSPHORUS: Phosphorus: 1.5 mg/dL — ABNORMAL LOW (ref 2.5–4.6)

## 2022-11-22 MED ORDER — VITAMIN D (ERGOCALCIFEROL) 1.25 MG (50000 UNIT) PO CAPS
50000.0000 [IU] | ORAL_CAPSULE | ORAL | Status: DC
  Filled 2022-11-22: qty 1

## 2022-11-22 MED ORDER — VITAMIN D (ERGOCALCIFEROL) 1.25 MG (50000 UNIT) PO CAPS
50000.0000 [IU] | ORAL_CAPSULE | Freq: Once | ORAL | Status: AC
  Administered 2022-11-22: 50000 [IU] via ORAL
  Filled 2022-11-22: qty 1

## 2022-11-22 MED ORDER — CALCIUM CARBONATE 1250 (500 CA) MG PO TABS
1000.0000 mg | ORAL_TABLET | Freq: Three times a day (TID) | ORAL | Status: DC
  Administered 2022-11-22 – 2022-11-26 (×11): 2500 mg via ORAL
  Filled 2022-11-22 (×11): qty 2

## 2022-11-22 MED ORDER — SODIUM PHOSPHATES 45 MMOLE/15ML IV SOLN
30.0000 mmol | Freq: Once | INTRAVENOUS | Status: AC
  Administered 2022-11-22: 30 mmol via INTRAVENOUS
  Filled 2022-11-22: qty 10

## 2022-11-22 NOTE — Progress Notes (Signed)
Received call from Stephan Minister [phone]), who claims he is patient's POA and brother. Mr. Suzanne Snyder provided with fax number and encouraged to fax POA paperwork to unit.  Mr. Suzanne Snyder states "She doesn't have a son. Sharene Skeans is a criminal boyfriend of Revonda Standard." Mr. Suzanne Snyder again encouraged to fax paperwork to the unit for documentation.

## 2022-11-22 NOTE — Progress Notes (Signed)
Subjective: Patient seen and examined at bedside this morning.  The overall pain and swelling in her left hand has improved.  Patient states that she is at a 3/10 pain scale today. She states that she has full sensation in her fingertips and is able to flex and extend her fingertips with increased discomfort, but able to perform function. Denies any fevers, chills, N/V, SOB or CP. Denies having any other concerns at this time.  Wished the patient a happy birthday!  Objective: Vital signs in last 24 hours: Temp:  [97.8 F (36.6 C)-98.9 F (37.2 C)] 98 F (36.7 C) (11/10 0807) Pulse Rate:  [58-70] 61 (11/10 0807) Resp:  [16-20] 16 (11/10 0807) BP: (123-139)/(46-54) 124/53 (11/10 0807) SpO2:  [94 %-100 %] 100 % (11/10 0807)  Intake/Output from previous day: No intake/output data recorded. Intake/Output this shift: No intake/output data recorded.  Recent Labs    11/20/22 0347 11/21/22 0822 11/22/22 0417  HGB 13.1 11.3* 12.2   Recent Labs    11/21/22 0822 11/22/22 0417  WBC 14.5* 5.7  RBC 3.89 4.23  HCT 34.5* 36.4  PLT 211 209   Recent Labs    11/21/22 0822 11/22/22 0417  NA 133* 133*  K 4.0 4.2  CL 101 105  CO2 19* 23  BUN 38* 31*  CREATININE 0.77 0.65  GLUCOSE 281* 196*  CALCIUM 6.9* 6.8*   No results for input(s): "LABPT", "INR" in the last 72 hours.  Physical Exam: Left upper extremity exam After removal of the patient's dressing, there is some appreciable erythema and swelling, that is greatly improved from her preoperative baseline.  Slightly increased swelling along the dorsal aspect of the hand when compared with yesterday.  Able to flex and extend the wrist with decreased ROM and slightly increased pain.  Able to flex and extend her fingers with mildly increased discomfort, noted flexion and extension at all DIP and PIP joints.  Noted to have some mild bloody drainage from Penrose drain (slowed from yesterday), drain remains in place.   New dressing was  applied using Xeroform gauze, gauze wrap, ABD pad and Ace wrap.  All compartments remain soft Intact radial pulse and brisk capillary refill  Assessment: S/p incision and drainage of left wrist and hand  Left hand swelling, flexor tenosynovitis, deep fluid collection over the distal radius  Plan:  Continue to monitor the incision as well as overall inflammation and erythema, looks to be improved from preoperative baseline.  Mild swelling appreciated along the dorsal aspect of the hand when compared with yesterday.  CBC shows white count at 5.7 today, hemoglobin at 12.2.  Vital signs stable  Penrose drain remains in place, new dressing applied  Patient was started on cyclobenzaprine on 11/9 to help with muscle spasms of the wrist and neck.  Reports that seems to be helping  Continue with ceftriaxone IV antibiotics, cultures pending  Continue with as needed pain meds for management, well-controlled  Josh Cherisa Brucker 11/22/2022, 11:56 AM

## 2022-11-22 NOTE — Progress Notes (Addendum)
Progress Note    [name]  ZOX:[id number] DOB: 09/21/1950  DOA: 11/19/2022 PCP: Gracelyn Nurse, MD      Brief Narrative:    Medical records reviewed and are as summarized below:  Suzanne Snyder is a 72 y.o. female with medical history significant for type 1 diabetes mellitus, asthma, tobacco use disorder, peripheral neuropathy, chronic pain, portal vein thrombosis on Eliquis, who presented to the hospital because of painful swelling of the left hand/wrist following a fall at home.  Her glucose was 36 in the emergency department so her insulin pump was taken off.  She was also found to have left hand abscess and left hand flexor tenosynovitis.    Assessment/Plan:   Principal Problem:   Hypoglycemia Active Problems:   Pain and swelling of left wrist   Acute metabolic encephalopathy   Fall   Portal vein obstruction   Suppurative tenosynovitis of flexor tendon of left hand     Body mass index is 17.63 kg/m.   Left volar wrist abscess, left hand suppurative flexor tenosynovitis: S/p I&D of left volar wrist abscess, left carpal tunnel release, left hand and flexor tendon irrigation and debridement on 11/20/2022. No growth on surgical cultures thus far.  Continue IV vancomycin and ceftriaxone.  Analgesics as needed for pain.  Follow-up with orthopedic surgeon.   Insulin-dependent diabetes mellitus with hyperglycemia, s/p hypoglycemia: Insulin pump has been held.  Continue insulin glargine at 10 units daily.  NovoLog as needed per sliding scale.  Monitor glucose levels closely and adjust insulin therapy as needed.   Acute metabolic encephalopathy: Improved   S/p fall: PT and OT recommended home health therapy   Portal vein thrombosis: She is on full dose Lovenox.  Eliquis on hold in case patient needs additional surgery.   Restless leg syndrome: Continue Requip.  She said she no longer takes pramipexole.   Hypocalcemia: Asymptomatic.  Check albumin and  ionized calcium level.  She has history of hypercalcemia.  It appears patient has vitamin D deficiency.  Repeat vitamin D levels. Restart home vitamin D.   Chronic neck and back pain: Analgesics as needed for pain    ADDENDUM  Phosphorus level came back at 1.5.  Start IV sodium phosphate for hypophosphatemia Start calcium carbonate for hypocalcemia  Diet Order             Diet regular Fluid consistency: Thin  Diet effective now                            Consultants: Orthopedic surgeon  Procedures: I&D of left volar wrist abscess, left carpal tunnel release, left hand and flexor tendon irrigation and debridement on 11/20/2022    Medications:    cyclobenzaprine  5 mg Oral QHS   docusate sodium  100 mg Oral BID   enoxaparin (LOVENOX) injection  50 mg Subcutaneous Q12H   gabapentin  300 mg Oral TID   insulin aspart  0-5 Units Subcutaneous QHS   insulin aspart  0-9 Units Subcutaneous TID WC   insulin glargine-yfgn  10 Units Subcutaneous Daily   rOPINIRole  2 mg Oral QHS   Continuous Infusions:  cefTRIAXone (ROCEPHIN)  IV 2 g (11/22/22 0519)   vancomycin 1,000 mg (11/22/22 0851)     Anti-infectives (From admission, onward)    Start     Dose/Rate Route Frequency Ordered Stop   11/20/22 0900  vancomycin (VANCOCIN) IVPB 1000 mg/200 mL premix  1,000 mg 200 mL/hr over 60 Minutes Intravenous Every 24 hours 11/19/22 0825     11/19/22 0845  vancomycin (VANCOREADY) IVPB 1250 mg/250 mL        1,250 mg 166.7 mL/hr over 90 Minutes Intravenous  Once 11/19/22 0812 11/19/22 1312   11/19/22 0545  cefTRIAXone (ROCEPHIN) 2 g in sodium chloride 0.9 % 100 mL IVPB        2 g 200 mL/hr over 30 Minutes Intravenous Every 24 hours 11/19/22 0541 11/26/22 0544              Family Communication/Anticipated D/C date and plan/Code Status   DVT prophylaxis:      Code Status: Full Code  Family Communication: None Disposition Plan: Plan to discharge  home   Status is: Inpatient Remains inpatient appropriate because: On IV antibiotics for left hand infection       Subjective:   Eval events noted.  No new complaints.  She still has pain in the left hand but is a little better compared to yesterday.   Objective:    Vitals:   11/21/22 1552 11/21/22 1950 11/22/22 0516 11/22/22 0807  BP: (!) 136/49 (!) 139/50 (!) 131/54 (!) 124/53  Pulse: 70 69 (!) 58 61  Resp:  20 20 16   Temp: 98.9 F (37.2 C) 98.3 F (36.8 C) 97.8 F (36.6 C) 98 F (36.7 C)  TempSrc:   Oral Oral  SpO2: 96% 96% 100% 100%  Weight:      Height:       No data found.  No intake or output data in the 24 hours ending 11/22/22 1259  Filed Weights   11/19/22 0205 11/20/22 1023  Weight: 52.6 kg 52.6 kg    Exam:  GEN: NAD SKIN: Warm and dry EYES: No pallor or icterus ENT: MMM CV: RRR PULM: CTA B ABD: soft, ND, NT, +BS CNS: AAO x 3, non focal EXT: Dressing on left hand surgical wound is clean, dry and intact      Data Reviewed:   I have personally reviewed following labs and imaging studies:  Labs: Labs show the following:   Basic Metabolic Panel: Recent Labs  Lab 11/19/22 0222 11/20/22 0347 11/21/22 0822 11/22/22 0417  NA 131* 130* 133* 133*  K 3.9 3.7 4.0 4.2  CL 100 98 101 105  CO2 24 23 19* 23  GLUCOSE 36* 158* 281* 196*  BUN 34* 18 38* 31*  CREATININE 0.64 0.61 0.77 0.65  CALCIUM 8.8* 7.8* 6.9* 6.8*  PHOS  --   --  2.6  --    GFR Estimated Creatinine Clearance: 53.6 mL/min (by C-G formula based on SCr of 0.65 mg/dL). Liver Function Tests: Recent Labs  Lab 11/19/22 0222 11/20/22 0347 11/21/22 0822  AST 22 13*  --   ALT 20 16  --   ALKPHOS 58 68  --   BILITOT 0.4 0.9  --   PROT 6.9 6.5  --   ALBUMIN 3.8 3.3* 3.0*   No results for input(s): "LIPASE", "AMYLASE" in the last 168 hours. Recent Labs  Lab 11/19/22 1809  AMMONIA 32   Coagulation profile No results for input(s): "INR", "PROTIME" in the last 168  hours.  CBC: Recent Labs  Lab 11/19/22 0222 11/20/22 0347 11/21/22 0822 11/22/22 0417  WBC 10.0 7.5 14.5* 5.7  NEUTROABS 6.5  --  12.2* 3.0  HGB 12.2 13.1 11.3* 12.2  HCT 37.3 39.2 34.5* 36.4  MCV 88.2 85.6 88.7 86.1  PLT 225 189 211  209   Cardiac Enzymes: No results for input(s): "CKTOTAL", "CKMB", "CKMBINDEX", "TROPONINI" in the last 168 hours. BNP (last 3 results) No results for input(s): "PROBNP" in the last 8760 hours. CBG: Recent Labs  Lab 11/21/22 1657 11/21/22 1946 11/21/22 2129 11/22/22 0809 11/22/22 1056  GLUCAP 278* 275* 258* 175* 200*   D-Dimer: No results for input(s): "DDIMER" in the last 72 hours. Hgb A1c: No results for input(s): "HGBA1C" in the last 72 hours.  Lipid Profile: No results for input(s): "CHOL", "HDL", "LDLCALC", "TRIG", "CHOLHDL", "LDLDIRECT" in the last 72 hours. Thyroid function studies: No results for input(s): "TSH", "T4TOTAL", "T3FREE", "THYROIDAB" in the last 72 hours.  Invalid input(s): "FREET3" Anemia work up: No results for input(s): "VITAMINB12", "FOLATE", "FERRITIN", "TIBC", "IRON", "RETICCTPCT" in the last 72 hours. Sepsis Labs: Recent Labs  Lab 11/19/22 0222 11/20/22 0347 11/21/22 0822 11/22/22 0417  WBC 10.0 7.5 14.5* 5.7    Microbiology Recent Results (from the past 240 hour(s))  Culture, blood (Routine X 2) w Reflex to ID Panel     Status: None (Preliminary result)   Collection Time: 11/19/22  4:15 PM   Specimen: BLOOD  Result Value Ref Range Status   Specimen Description BLOOD BLOOD LEFT ARM  Final   Special Requests   Final    BOTTLES DRAWN AEROBIC ONLY Blood Culture results may not be optimal due to an inadequate volume of blood received in culture bottles   Culture   Final    NO GROWTH 3 DAYS Performed at [facility], [address] [place], [place] [address]    Report Status PENDING  Incomplete  Culture, blood (Routine X 2) w Reflex to ID Panel     Status: None (Preliminary result)    Collection Time: 11/19/22  4:20 PM   Specimen: BLOOD  Result Value Ref Range Status   Specimen Description BLOOD BLOOD LEFT ARM  Final   Special Requests   Final    BOTTLES DRAWN AEROBIC AND ANAEROBIC Blood Culture results may not be optimal due to an inadequate volume of blood received in culture bottles   Culture   Final    NO GROWTH 3 DAYS Performed at [facility], [address] [place], [place] [address]    Report Status PENDING  Incomplete  Aerobic/Anaerobic Culture w Gram Stain (surgical/deep wound)     Status: None (Preliminary result)   Collection Time: 11/20/22 11:38 AM   Specimen: Path fluid; Body Fluid  Result Value Ref Range Status   Specimen Description ABSCESS  Final   Special Requests LEFT WRIST  Final   Gram Stain   Final    FEW WBC PRESENT,BOTH PMN AND MONONUCLEAR NO ORGANISMS SEEN    Culture   Final    NO GROWTH < 24 HOURS Performed at [facility], [address] [place], [place] [address]    Report Status PENDING  Incomplete  Aerobic/Anaerobic Culture w Gram Stain (surgical/deep wound)     Status: None (Preliminary result)   Collection Time: 11/20/22 11:39 AM   Specimen: Path fluid; Body Fluid  Result Value Ref Range Status   Specimen Description WOUND  Final   Special Requests LEFT WRIST  Final   Gram Stain NO WBC SEEN NO ORGANISMS SEEN   Final   Culture   Final    NO GROWTH < 24 HOURS Performed at [facility], [address] [place], [place] [address]    Report Status PENDING  Incomplete  Aerobic/Anaerobic Culture  w Gram Stain (surgical/deep wound)     Status: None (Preliminary result)   Collection Time: 11/20/22 11:51 AM   Specimen: Path fluid; Body Fluid  Result Value Ref Range Status   Specimen Description WOUND  Final   Special Requests LEFT HAND PALM FLEXOR TENDON  Final   Gram Stain   Final    RARE WBC PRESENT, PREDOMINANTLY PMN NO ORGANISMS SEEN    Culture   Final    NO GROWTH < 24 HOURS Performed at [facility], [address] [place], [place] [address]    Report Status PENDING  Incomplete    Procedures and diagnostic studies:  No results found.             LOS: 3 days   Donice Alperin  [facility]   Pager on [email or web address]. If 7PM-7AM, please contact night-coverage at [email or web address]     11/22/2022, 12:59 PM

## 2022-11-23 DIAGNOSIS — E162 Hypoglycemia, unspecified: Secondary | ICD-10-CM | POA: Diagnosis not present

## 2022-11-23 DIAGNOSIS — M65142 Other infective (teno)synovitis, left hand: Secondary | ICD-10-CM | POA: Diagnosis not present

## 2022-11-23 LAB — BASIC METABOLIC PANEL
Anion gap: 7 (ref 5–15)
BUN: 16 mg/dL (ref 8–23)
CO2: 24 mmol/L (ref 22–32)
Calcium: 6.9 mg/dL — ABNORMAL LOW (ref 8.9–10.3)
Chloride: 107 mmol/L (ref 98–111)
Creatinine, Ser: 0.41 mg/dL — ABNORMAL LOW (ref 0.44–1.00)
GFR, Estimated: >60 mL/min (ref >60)
Glucose, Bld: 98 mg/dL (ref 70–99)
Potassium: 3.6 mmol/L (ref 3.5–5.1)
Sodium: 138 mmol/L (ref 135–145)

## 2022-11-23 LAB — GLUCOSE, CAPILLARY
Glucose-Capillary: 114 mg/dL — ABNORMAL HIGH (ref 70–99)
Glucose-Capillary: 122 mg/dL — ABNORMAL HIGH (ref 70–99)
Glucose-Capillary: 165 mg/dL — ABNORMAL HIGH (ref 70–99)
Glucose-Capillary: 184 mg/dL — ABNORMAL HIGH (ref 70–99)
Glucose-Capillary: 237 mg/dL — ABNORMAL HIGH (ref 70–99)
Glucose-Capillary: 326 mg/dL — ABNORMAL HIGH (ref 70–99)
Glucose-Capillary: 90 mg/dL (ref 70–99)

## 2022-11-23 LAB — CBC
HCT: 35.3 % — ABNORMAL LOW (ref 36.0–46.0)
Hemoglobin: 11.7 g/dL — ABNORMAL LOW (ref 12.0–15.0)
MCH: 29.3 pg (ref 26.0–34.0)
MCHC: 33.1 g/dL (ref 30.0–36.0)
MCV: 88.3 fL (ref 80.0–100.0)
Platelets: 234 10*3/uL (ref 150–400)
RBC: 4 MIL/uL (ref 3.87–5.11)
RDW: 15 % (ref 11.5–15.5)
WBC: 5.9 10*3/uL (ref 4.0–10.5)
nRBC: 0 % (ref 0.0–0.2)

## 2022-11-23 LAB — PHOSPHORUS: Phosphorus: 2.5 mg/dL (ref 2.5–4.6)

## 2022-11-23 LAB — VITAMIN D 25 HYDROXY (VIT D DEFICIENCY, FRACTURES): Vit D, 25-Hydroxy: 25.43 ng/mL — ABNORMAL LOW (ref 30–100)

## 2022-11-23 MED ORDER — VITAMIN D (ERGOCALCIFEROL) 1.25 MG (50000 UNIT) PO CAPS
150000.0000 [IU] | ORAL_CAPSULE | ORAL | Status: DC
  Administered 2022-11-23 – 2022-11-25 (×2): 150000 [IU] via ORAL
  Filled 2022-11-23 (×2): qty 3

## 2022-11-23 MED ORDER — INSULIN ASPART 100 UNIT/ML IJ SOLN
2.0000 [IU] | Freq: Three times a day (TID) | INTRAMUSCULAR | Status: DC
  Administered 2022-11-23 – 2022-11-26 (×8): 2 [IU] via SUBCUTANEOUS
  Filled 2022-11-23 (×8): qty 1

## 2022-11-23 NOTE — Plan of Care (Signed)

## 2022-11-23 NOTE — Inpatient Diabetes Management (Signed)
Inpatient Diabetes Program Recommendations  AACE/ADA: New Consensus Statement on Inpatient Glycemic Control (2015)  Target Ranges:  Prepandial:   less than 140 mg/dL      Peak postprandial:   less than 180 mg/dL (1-2 hours)      Critically ill patients:  140 - 180 mg/dL    Latest Reference Range & Units 11/22/22 08:09 11/22/22 10:56 11/22/22 16:23 11/22/22 20:01 11/22/22 21:02  Glucose-Capillary 70 - 99 mg/dL 865 (H)  2 units Novolog  10 units Semglee  200 (H)  2 units Novolog  141 (H)  1 unit Novolog  297 (H) 285 (H)  3 units Novolog   (H): Data is abnormally high  Latest Reference Range & Units 11/23/22 07:28 11/23/22 11:56  Glucose-Capillary 70 - 99 mg/dL 784 (H)    10 units Semglee 326 (H)  7 units Novolog   (H): Data is abnormally high     History: Type 1 diabetes  Home DM Meds: Omnipod insulin pump   Current Orders: Semglee 10 units daily     Novolog Sensitive Correction Scale/ SSI (0-9 units) TID AC + HS    MD- Note rise in CBGs after meal intake  Please consider Starting Novolog Meal Coverage:  Novolog 2 units TID with meals HOLD if pt NPO HOLD if pt eats <50% meals    --Will follow patient during hospitalization--  Ambrose Finland RN, MSN, CDCES Diabetes Coordinator Inpatient Glycemic Control Team Team Pager: [phone] (8a-5p)

## 2022-11-23 NOTE — Evaluation (Signed)
Occupational Therapy Re-Evaluation Patient Details Name: Suzanne Snyder MRN: [id number] DOB: May 25, 1950 Today's Date: 11/23/2022   History of Present Illness Per MD Notes: Suzanne Snyder is a 72 year old female presenting to the emergency department for evaluation of hand pain.  In triage, patient reported that she fell out of her bed last night and injured her hand.  She does report that she hit her head.  Was noted to be confused in triage, so blood sugar was obtained which returned low at 36. CT of hand/wrist showed extensive diffuse soft tissue swelling. No acute fracture or dislocation. Head CT was negative for acute abnormality. She is s/p Left volar wrist abscess I&D left carpal tunnel release left hand flexor tendons irrigation and debridement on 11/21/2022. Patient is NWB in LUE.   Clinical Impression   Pt seen for OT re-evaluation this date now s/p L wrist/hand I&D. Pt endorses 7.5/10 LUE pain up through her shoulder. LUE noted to be malpositioned with limited elevation noted. Pt educated in positioning of LUE to support edema mgt. Pillows placed with notable improvements in neutral positioning and elevation.  Pt completed bed mobility and ADL transfers from EOB and reg height toilet with Supv-SBA, ambulated to/from bathroom with CGA, and completed pericare after toileting with supv for safety. Pt instructed in L hand gentle AROM movements of the hand/digits to complete when pain is well controlled to minimize stiffness and weakness. Pt verbalized understanding. Pt continues to benefit from skilled OT services, now more functionally limited with LUE restrictions and increased pain.       If plan is discharge home, recommend the following: A little help with walking and/or transfers;A little help with bathing/dressing/bathroom;Assistance with cooking/housework;Assist for transportation;Help with stairs or ramp for entrance    Functional Status Assessment  Patient has had a recent decline  in their functional status and demonstrates the ability to make significant improvements in function in a reasonable and predictable amount of time.  Equipment Recommendations  None recommended by OT    Recommendations for Other Services       Precautions / Restrictions Precautions Precautions: Fall Restrictions Weight Bearing Restrictions: Yes LUE Weight Bearing: Non weight bearing Other Position/Activity Restrictions: NWB through wrist/hand      Mobility Bed Mobility                    Transfers                          Balance                                           ADL either performed or assessed with clinical judgement   ADL                                               Vision         Perception         Praxis         Pertinent Vitals/Pain Pain Assessment Pain Assessment: 0-10 Pain Score: 7  Pain Location: LUE Pain Descriptors / Indicators: Aching, Sore, Discomfort Pain Intervention(s): Monitored during session, Repositioned, Patient requesting pain meds-RN notified     Extremity/Trunk Assessment Upper Extremity Assessment  Upper Extremity Assessment: Generalized weakness;Right hand dominant;LUE deficits/detail LUE Deficits / Details: L wrist/hand s/p I&D, NWBing LUE: Unable to fully assess due to pain;Unable to fully assess due to immobilization LUE Sensation: WNL LUE Coordination: decreased fine motor;decreased gross motor   Lower Extremity Assessment Lower Extremity Assessment: Generalized weakness   Cervical / Trunk Assessment Cervical / Trunk Assessment: Kyphotic   Communication Communication Communication: No apparent difficulties   Cognition Arousal: Alert Behavior During Therapy: Lability, Anxious Overall Cognitive Status: No family/caregiver present to determine baseline cognitive functioning                                       General Comments  Pt with  continent void on toilet.    Exercises Other Exercises Other Exercises: Pt instructed in L hand gentle AROM movements of the hand/digits to complete when pain is well controlled to minimize stiffness and weakness. Pt also educated in positioning of LUE to support edema mgt. Pillows placed with notable improvements in neutral positioning and elevation.   Shoulder Instructions      Home Living Family/patient expects to be discharged to:: Private residence Living Arrangements: Alone   Type of Home: House Home Access: Stairs to enter [employer] of Steps: 4 steps with L railing from garage entrance; 6 steps with B railings (unable to reach both at same time) from front entrance Entrance Stairs-Rails: Right;Left;Can reach both Home Layout: One level     Bathroom Shower/Tub: Producer, television/film/video: Standard Bathroom Accessibility: Yes   Home Equipment: Agricultural consultant (2 wheels);Rollator (4 wheels);Grab bars - tub/shower;Hand held shower head;BSC/3in1;Wheelchair - manual;Shower seat - built in;Grab bars - toilet   Additional Comments: bed rail; stool with rail (to get in/out of higher bed); 2 walking sticks      Prior Functioning/Environment Prior Level of Function : Independent/Modified Independent;Driving             Mobility Comments: Pt reports she would walk around home without assistive device, holding onto furniture, would use walking sticks as needed but mostly when out of the home. ADLs Comments: Ind with self care and IADLs        OT Problem List: Decreased strength;Decreased range of motion;Decreased safety awareness;Decreased coordination;Impaired balance (sitting and/or standing);Decreased knowledge of use of DME or AE;Impaired UE functional use;Decreased activity tolerance      OT Treatment/Interventions: Self-care/ADL training;Therapeutic exercise;Therapeutic activities;Cognitive remediation/compensation;DME and/or AE instruction;Balance  training;Patient/family education;Energy conservation;Neuromuscular education    OT Goals(Current goals can be found in the care plan section) Acute Rehab OT Goals Patient Stated Goal: feel  better OT Goal Formulation: With patient Time For Goal Achievement: 12/07/22 Potential to Achieve Goals: Good ADL Goals Pt Will Transfer to Toilet: with modified independence;ambulating;regular height toilet (LRAD)  OT Frequency: Min 1X/week    Co-evaluation              AM-PAC OT "6 Clicks" Daily Activity     Outcome Measure Help from another person eating meals?: A Little Help from another person taking care of personal grooming?: A Little Help from another person toileting, which includes using toliet, bedpan, or urinal?: A Little Help from another person bathing (including washing, rinsing, drying)?: A Lot Help from another person to put on and taking off regular upper body clothing?: A Little Help from another person to put on and taking off regular lower body clothing?: A Lot 6 Click Score:  16   End of Session Nurse Communication: Mobility status;Patient requests pain meds  Activity Tolerance: Patient tolerated treatment well Patient left: in bed;with call bell/phone within reach;with bed alarm set  OT Visit Diagnosis: Other abnormalities of gait and mobility (R26.89);Pain Pain - Right/Left: Left Pain - part of body: Hand;Arm;Shoulder                Time: 2951-8841 OT Time Calculation (min): 30 min Charges:  OT General Charges $OT Visit: 1 Visit OT Evaluation $OT Re-eval: 1 Re-eval OT Treatments $Therapeutic Activity: 8-22 mins  Arman Filter., MPH, MS, OTR/L ascom [phone] 11/23/22, 2:36 PM

## 2022-11-23 NOTE — Progress Notes (Signed)
   11/23/22 1100  Spiritual Encounters  Type of Visit Initial  Care provided to: Patient  Referral source Patient request;Family  Reason for visit Advance directives  OnCall Visit No  Spiritual Framework  Patient Stress Factors Major life changes;Health changes  Interventions  Spiritual Care Interventions Made Established relationship of care and support;Compassionate presence;Reflective listening;Normalization of emotions;Explored ethical dilemma;Prayer  Intervention Outcomes  Outcomes Connection to spiritual care;Awareness of support;Reduced anxiety;Reduced fear  Spiritual Care Plan  Spiritual Care Issues Still Outstanding Chaplain will continue to follow   Patient was dealing with sadness and fear as I enter the room. Patient was afraid that her brother who she appointed as POV did not have the paperwork. We spoke with the PT brother who is faxing over the paperwork to be her POV. Patient don't want her daughter to have any control over her medical decisions. Patient is still dealing with sadness of losing her husband last year. Prayer with patient and help patient work through her emotions.

## 2022-11-23 NOTE — Progress Notes (Addendum)
Progress Note    Suzanne Snyder  RJJ:[id number] DOB: 02-27-1950  DOA: 11/19/2022 PCP: Gracelyn Nurse, MD      Brief Narrative:    Medical records reviewed and are as summarized below:  Suzanne Snyder is a 72 y.o. female with medical history significant for type 1 diabetes mellitus, asthma, tobacco use disorder, peripheral neuropathy, chronic pain, portal vein thrombosis on Eliquis, who presented to the hospital because of painful swelling of the left hand/wrist following a fall at home.  Her glucose was 36 in the emergency department so her insulin pump was taken off.  She was also found to have left hand abscess and left hand flexor tenosynovitis.    Assessment/Plan:   Principal Problem:   Hypoglycemia Active Problems:   Pain and swelling of left wrist   Acute metabolic encephalopathy   Fall   Portal vein obstruction   Suppurative tenosynovitis of flexor tendon of left hand     Body mass index is 17.63 kg/m.   Left volar wrist abscess, left hand suppurative flexor tenosynovitis: S/p I&D of left volar wrist abscess, left carpal tunnel release, left hand and flexor tendon irrigation and debridement on 11/20/2022. No growth on surgical cultures thus far.  Continue IV vancomycin and ceftriaxone.  Analgesics as needed for pain.  Follow-up with orthopedic surgeon for further recommendations.   Insulin-dependent diabetes mellitus with hyperglycemia, s/p hypoglycemia: Insulin pump has been held.  Add scheduled NovoLog 2 units 3 times daily with meals.  Continue insulin glargine at 10 units daily.  NovoLog as needed per sliding scale.  Monitor glucose levels closely and adjust insulin therapy as needed. Will be cautious with escalating doses of insulin because patient reports history of recurrent hypoglycemia.   Acute metabolic encephalopathy: Improved   Hypocalcemia: Continue calcium supplement Hypophosphatemia: Improved with IV sodium phosphate History of severe  vitamin D deficiency: Continue high-dose vitamin D supplement.  She said she has been on high-dose vitamin D 150,000 units 3 times a week for many years.  This was prescribed by her endocrinologist. Vitamin D level 25.43.   S/p fall: PT and OT recommended home health therapy   Portal vein thrombosis: She is on full dose Lovenox.  Eliquis on hold in case patient needs additional surgery.   Restless leg syndrome: Continue Requip.  She said she no longer takes pramipexole.   Chronic neck and back pain: Analgesics as needed for pain    Diet Order             Diet regular Fluid consistency: Thin  Diet effective now                            Consultants: Orthopedic surgeon  Procedures: I&D of left volar wrist abscess, left carpal tunnel release, left hand and flexor tendon irrigation and debridement on 11/20/2022    Medications:    calcium carbonate  1,000 mg of elemental calcium Oral TID WC   cyclobenzaprine  5 mg Oral QHS   docusate sodium  100 mg Oral BID   enoxaparin (LOVENOX) injection  50 mg Subcutaneous Q12H   gabapentin  300 mg Oral TID   insulin aspart  0-5 Units Subcutaneous QHS   insulin aspart  0-9 Units Subcutaneous TID WC   insulin glargine-yfgn  10 Units Subcutaneous Daily   rOPINIRole  2 mg Oral QHS   Vitamin D (Ergocalciferol)  150,000 Units Oral Once per day on Monday  Wednesday Friday   Continuous Infusions:  cefTRIAXone (ROCEPHIN)  IV 2 g (11/23/22 0520)   vancomycin 1,000 mg (11/23/22 0946)     Anti-infectives (From admission, onward)    Start     Dose/Rate Route Frequency Ordered Stop   11/20/22 0900  vancomycin (VANCOCIN) IVPB 1000 mg/200 mL premix        1,000 mg 200 mL/hr over 60 Minutes Intravenous Every 24 hours 11/19/22 0825     11/19/22 0845  vancomycin (VANCOREADY) IVPB 1250 mg/250 mL        1,250 mg 166.7 mL/hr over 90 Minutes Intravenous  Once 11/19/22 0812 11/19/22 1312   11/19/22 0545  cefTRIAXone (ROCEPHIN) 2 g in  sodium chloride 0.9 % 100 mL IVPB        2 g 200 mL/hr over 30 Minutes Intravenous Every 24 hours 11/19/22 0541 11/26/22 0544              Family Communication/Anticipated D/C date and plan/Code Status   DVT prophylaxis:      Code Status: Full Code  Family Communication: None Disposition Plan: Plan to discharge home   Status is: Inpatient Remains inpatient appropriate because: On IV antibiotics for left hand infection       Subjective:   She complains of pain in the left hand.  She wants to continue with IV pain medicine till tomorrow.   Objective:    Vitals:   11/22/22 1534 11/22/22 2022 11/23/22 0413 11/23/22 0726  BP: (!) 129/55 130/65 113/63 126/80  Pulse: (!) 59 (!) 55 (!) 59 (!) 57  Resp: 18 19 19 15   Temp: 98.2 F (36.8 C) 98.1 F (36.7 C) 98.2 F (36.8 C) 98.3 F (36.8 C)  TempSrc:      SpO2: 99% 98% 99% 100%  Weight:      Height:       No data found.   Intake/Output Summary (Last 24 hours) at 11/23/2022 1227 Last data filed at 11/22/2022 1800 Gross per 24 hour  Intake 37.98 ml  Output --  Net 37.98 ml    Filed Weights   11/19/22 0205 11/20/22 1023  Weight: 52.6 kg 52.6 kg    Exam:   GEN: NAD SKIN: No rash EYES: No pallor or icterus ENT: MMM CV: RRR PULM: CTA B ABD: soft, ND, NT, +BS CNS: AAO x 3, non focal EXT: Dressing on left hand surgical wound is clean, dry and intact     Data Reviewed:   I have personally reviewed following labs and imaging studies:  Labs: Labs show the following:   Basic Metabolic Panel: Recent Labs  Lab 11/19/22 0222 11/20/22 0347 11/21/22 0822 11/22/22 0413 11/22/22 0417 11/23/22 0409  NA 131* 130* 133*  --  133* 138  K 3.9 3.7 4.0  --  4.2 3.6  CL 100 98 101  --  105 107  CO2 24 23 19*  --  23 24  GLUCOSE 36* 158* 281*  --  196* 98  BUN 34* 18 38*  --  31* 16  CREATININE 0.64 0.61 0.77  --  0.65 0.41*  CALCIUM 8.8* 7.8* 6.9*  --  6.8* 6.9*  PHOS  --   --  2.6 1.5*  --  2.5    GFR Estimated Creatinine Clearance: 53.6 mL/min (A) (by C-G formula based on SCr of 0.41 mg/dL (L)). Liver Function Tests: Recent Labs  Lab 11/19/22 0222 11/20/22 0347 11/21/22 0822 11/22/22 0413  AST 22 13*  --   --  ALT 20 16  --   --   ALKPHOS 58 68  --   --   BILITOT 0.4 0.9  --   --   PROT 6.9 6.5  --   --   ALBUMIN 3.8 3.3* 3.0* 3.0*   No results for input(s): "LIPASE", "AMYLASE" in the last 168 hours. Recent Labs  Lab 11/19/22 1809  AMMONIA 32   Coagulation profile No results for input(s): "INR", "PROTIME" in the last 168 hours.  CBC: Recent Labs  Lab 11/19/22 0222 11/20/22 0347 11/21/22 0822 11/22/22 0417 11/23/22 0409  WBC 10.0 7.5 14.5* 5.7 5.9  NEUTROABS 6.5  --  12.2* 3.0  --   HGB 12.2 13.1 11.3* 12.2 11.7*  HCT 37.3 39.2 34.5* 36.4 35.3*  MCV 88.2 85.6 88.7 86.1 88.3  PLT 225 189 211 209 234   Cardiac Enzymes: No results for input(s): "CKTOTAL", "CKMB", "CKMBINDEX", "TROPONINI" in the last 168 hours. BNP (last 3 results) No results for input(s): "PROBNP" in the last 8760 hours. CBG: Recent Labs  Lab 11/22/22 2102 11/23/22 0037 11/23/22 0410 11/23/22 0728 11/23/22 1156  GLUCAP 285* 165* 90 114* 326*   D-Dimer: No results for input(s): "DDIMER" in the last 72 hours. Hgb A1c: No results for input(s): "HGBA1C" in the last 72 hours.  Lipid Profile: No results for input(s): "CHOL", "HDL", "LDLCALC", "TRIG", "CHOLHDL", "LDLDIRECT" in the last 72 hours. Thyroid function studies: No results for input(s): "TSH", "T4TOTAL", "T3FREE", "THYROIDAB" in the last 72 hours.  Invalid input(s): "FREET3" Anemia work up: No results for input(s): "VITAMINB12", "FOLATE", "FERRITIN", "TIBC", "IRON", "RETICCTPCT" in the last 72 hours. Sepsis Labs: Recent Labs  Lab 11/20/22 0347 11/21/22 0822 11/22/22 0417 11/23/22 0409  WBC 7.5 14.5* 5.7 5.9    Microbiology Recent Results (from the past 240 hour(s))  Culture, blood (Routine X 2) w Reflex to ID  Panel     Status: None (Preliminary result)   Collection Time: 11/19/22  4:15 PM   Specimen: BLOOD  Result Value Ref Range Status   Specimen Description BLOOD BLOOD LEFT ARM  Final   Special Requests   Final    BOTTLES DRAWN AEROBIC ONLY Blood Culture results may not be optimal due to an inadequate volume of blood received in culture bottles   Culture   Final    NO GROWTH 3 DAYS Performed at [facility], [address], [place], [place] [address]    Report Status PENDING  Incomplete  Culture, blood (Routine X 2) w Reflex to ID Panel     Status: None (Preliminary result)   Collection Time: 11/19/22  4:20 PM   Specimen: BLOOD  Result Value Ref Range Status   Specimen Description BLOOD BLOOD LEFT ARM  Final   Special Requests   Final    BOTTLES DRAWN AEROBIC AND ANAEROBIC Blood Culture results may not be optimal due to an inadequate volume of blood received in culture bottles   Culture   Final    NO GROWTH 3 DAYS Performed at [facility], [address] [place], [place] [address]    Report Status PENDING  Incomplete  Aerobic/Anaerobic Culture w Gram Stain (surgical/deep wound)     Status: None (Preliminary result)   Collection Time: 11/20/22 11:38 AM   Specimen: Path fluid; Body Fluid  Result Value Ref Range Status   Specimen Description ABSCESS  Final   Special Requests LEFT WRIST  Final   Gram Stain   Final    FEW WBC PRESENT,BOTH PMN  AND MONONUCLEAR NO ORGANISMS SEEN    Culture   Final    NO GROWTH 3 DAYS NO ANAEROBES ISOLATED; CULTURE IN PROGRESS FOR 5 DAYS Performed at [facility], [address], [place], [place] [address]    Report Status PENDING  Incomplete  Aerobic/Anaerobic Culture w Gram Stain (surgical/deep wound)     Status: None (Preliminary result)   Collection Time: 11/20/22 11:39 AM   Specimen: Path fluid; Body Fluid  Result Value Ref Range Status   Specimen Description WOUND  Final   Special Requests LEFT WRIST  Final   Gram  Stain NO WBC SEEN NO ORGANISMS SEEN   Final   Culture   Final    NO GROWTH 3 DAYS NO ANAEROBES ISOLATED; CULTURE IN PROGRESS FOR 5 DAYS Performed at [facility], [address] [place], [place] [address]    Report Status PENDING  Incomplete  Aerobic/Anaerobic Culture w Gram Stain (surgical/deep wound)     Status: None (Preliminary result)   Collection Time: 11/20/22 11:51 AM   Specimen: Path fluid; Body Fluid  Result Value Ref Range Status   Specimen Description WOUND  Final   Special Requests LEFT HAND PALM FLEXOR TENDON  Final   Gram Stain   Final    RARE WBC PRESENT, PREDOMINANTLY PMN NO ORGANISMS SEEN    Culture   Final    NO GROWTH 3 DAYS NO ANAEROBES ISOLATED; CULTURE IN PROGRESS FOR 5 DAYS Performed at [facility], [address], [place], [place] [address]    Report Status PENDING  Incomplete    Procedures and diagnostic studies:  No results found.             LOS: 4 days   Gaige Sebo  [facility]   Pager on [email or web address]. If 7PM-7AM, please contact night-coverage at [email or web address]     11/23/2022, 12:27 PM

## 2022-11-23 NOTE — Progress Notes (Signed)
   11/23/22 1400  Spiritual Encounters  Type of Visit Follow up  Care provided to: Patient  Referral source Patient request;Nurse (RN/NT/LPN)  Reason for visit Advance directives  OnCall Visit No  Spiritual Framework  Patient Stress Factors Major life changes  Interventions  Spiritual Care Interventions Made Compassionate presence  Intervention Outcomes  Outcomes Awareness of support  Spiritual Care Plan  Spiritual Care Issues Still Outstanding No further spiritual care needs at this time (see row info)   Received information from Patient's brother for POV give brother authority incase Patient can not make those decisions. POV paperwork has been scan into the system in put in patient's Chart no other issues at this time. Patient has copy of the paperwork in her room.

## 2022-11-23 NOTE — Progress Notes (Signed)
Physical Therapy Treatment Patient Details Name: Suzanne Snyder MRN: [id number] DOB: Nov 28, 1950 Today's Date: 11/23/2022   History of Present Illness Per MD Notes: Suzanne Snyder is a 72 year old female presenting to the emergency department for evaluation of hand pain.  In triage, patient reported that she fell out of her bed last night and injured her hand.  She does report that she hit her head.  Was noted to be confused in triage, so blood sugar was obtained which returned low at 36. CT of hand/wrist showed extensive diffuse soft tissue swelling. No acute fracture or dislocation. Head CT was negative for acute abnormality. She is s/p Left volar wrist abscess I&D left carpal tunnel release left hand flexor tendons irrigation and debridement on 11/21/2022. Patient is NWB in LUE.    PT Comments  Pt seen for PT tx with pt agreeable to tx despite being anxious re: several social issues; PT provides therapeutic listening & encouragement throughout session. Session focused on gait training with SPC but pt with decreased receptiveness to education re: to reduce gait speed & for increased balance. Pt is also able to ambulate without AD with almost comparable balance as gait with SPC. Pt would benefit from ongoing PT services to address balance, strengthening, & gait with LRAD.    If plan is discharge home, recommend the following: A little help with walking and/or transfers;Assistance with cooking/housework;Assist for transportation;Help with stairs or ramp for entrance;A little help with bathing/dressing/bathroom   Can travel by private vehicle        Equipment Recommendations  Cane    Recommendations for Other Services       Precautions / Restrictions Precautions Precautions: Fall Restrictions Weight Bearing Restrictions: Yes LUE Weight Bearing: Non weight bearing Other Position/Activity Restrictions: NWB through wrist/hand     Mobility  Bed Mobility Overal bed mobility: Needs  Assistance Bed Mobility: Supine to Sit, Sit to Supine     Supine to sit: Supervision, HOB elevated, Used rails Sit to supine: Supervision, HOB elevated, Used rails        Transfers Overall transfer level: Needs assistance Equipment used: None Transfers: Sit to/from Stand, Bed to chair/wheelchair/BSC Sit to Stand: Supervision, Contact guard assist   Step pivot transfers: Supervision (recliner>bed)       General transfer comment: STS from bed, STS from recliner, pt appears to push through L wrist at times.    Ambulation/Gait Ambulation/Gait assistance: Contact guard assist Gait Distance (Feet): 175 Feet (+ 15 ft + 20 ft) Assistive device: Straight cane Gait Pattern/deviations: Decreased weight shift to left, Decreased stride length       General Gait Details: Pt ambulates with SPC in RUE with decreased coordination of gait pattern with AD, pt with increased gait speed with PT providing cuing to decrease speed to focus on gait pattern but poor return demo. Pt then ambulates around room without AD with PRN reaching for furniture for support but balance almost comparable as to walking with SPC.   Stairs             Wheelchair Mobility     Tilt Bed    Modified Rankin (Stroke Patients Only)       Balance Overall balance assessment: Needs assistance Sitting-balance support: Feet supported, Single extremity supported       Standing balance support: During functional activity, Bilateral upper extremity supported, Single extremity supported, No upper extremity supported Standing balance-Leahy Scale: Fair  Cognition Arousal: Alert Behavior During Therapy: Lability, Anxious                                   General Comments: Pt very concerned re: papers getting faxed to her, her brother becoming POA, & other family/social things with PT providing therapeutic listening/encouragement throughout. Pt also  concerned about dressing on L wrist & nurse made aware.        Exercises      General Comments General comments (skin integrity, edema, etc.): Pt with continent void on toilet.      Pertinent Vitals/Pain Pain Assessment Pain Assessment: Faces Faces Pain Scale: Hurts little more Pain Location: L wrist Pain Descriptors / Indicators: Aching, Sore, Discomfort Pain Intervention(s): Monitored during session    Home Living                          Prior Function            PT Goals (current goals can now be found in the care plan section) Acute Rehab PT Goals Patient Stated Goal: to go home and get better PT Goal Formulation: With patient Time For Goal Achievement: 12/05/22 Potential to Achieve Goals: Good Progress towards PT goals: Progressing toward goals    Frequency    Min 1X/week      PT Plan      Co-evaluation              AM-PAC PT "6 Clicks" Mobility   Outcome Measure  Help needed turning from your back to your side while in a flat bed without using bedrails?: None Help needed moving from lying on your back to sitting on the side of a flat bed without using bedrails?: None Help needed moving to and from a bed to a chair (including a wheelchair)?: A Little Help needed standing up from a chair using your arms (e.g., wheelchair or bedside chair)?: A Little Help needed to walk in hospital room?: A Little Help needed climbing 3-5 steps with a railing? : A Little 6 Click Score: 20    End of Session   Activity Tolerance: Patient tolerated treatment well Patient left: in bed;with call bell/phone within reach;with bed alarm set   PT Visit Diagnosis: Muscle weakness (generalized) (M62.81);Other abnormalities of gait and mobility (R26.89);Unsteadiness on feet (R26.81)     Time: 1610-9604 PT Time Calculation (min) (ACUTE ONLY): 20 min  Charges:    $Therapeutic Activity: 8-22 mins PT General Charges $$ ACUTE PT VISIT: 1 Visit                      Aleda Grana, PT, DPT 11/23/22, 12:49 PM    Sandi Mariscal 11/23/2022, 12:47 PM

## 2022-11-23 NOTE — Progress Notes (Signed)
Pharmacy Antibiotic Note  Suzanne Snyder is a 72 y.o. female admitted on 11/19/2022 with hand cellulitis.  Pharmacy has been consulted for vancomycin dosing.   Plan: Continue with Vancomycin 1000 mg IV Q 24 hrs. Goal AUC 400-550. Expected AUC: 540, Cmin 12.4 SCr used: 0.8  (actual 0.64), TBW, Vd 0.72 Consider obtaining levels tomorrow.   Also on ceftriaxone 2 grams every 24 hours  Height: 5\' 8"  (172.7 cm) Weight: 52.6 kg (115 lb 15.4 oz) IBW/kg (Calculated) : 63.9  Temp (24hrs), Avg:98.2 F (36.8 C), Min:98 F (36.7 C), Max:98.3 F (36.8 C)  Recent Labs  Lab 11/19/22 0222 11/20/22 0347 11/21/22 0822 11/22/22 0417 11/23/22 0409  WBC 10.0 7.5 14.5* 5.7 5.9  CREATININE 0.64 0.61 0.77 0.65 0.41*    Estimated Creatinine Clearance: 53.6 mL/min (A) (by C-G formula based on SCr of 0.41 mg/dL (L)).    Allergies  Allergen Reactions   Cat Hair Extract Shortness Of Breath   Pregabalin Anaphylaxis   Erythromycin Other (See Comments)    Severe stomach upset    Antimicrobials this admission: Ceftriaxone  11/7 >>   Vancomycin  11/7 >>    Dose adjustments this admission:    Microbiology results: Left wrist path fluid: no organisms Abscess: no organisms Blood culture: NG x 3 days  Thank you for allowing pharmacy to be a part of this patient's care.   Elliot Gurney, PharmD, BCPS Clinical Pharmacist  11/23/2022 7:45 AM

## 2022-11-24 DIAGNOSIS — E162 Hypoglycemia, unspecified: Secondary | ICD-10-CM | POA: Diagnosis not present

## 2022-11-24 DIAGNOSIS — M65142 Other infective (teno)synovitis, left hand: Secondary | ICD-10-CM | POA: Diagnosis not present

## 2022-11-24 LAB — CULTURE, BLOOD (ROUTINE X 2)
Culture: NO GROWTH
Culture: NO GROWTH

## 2022-11-24 LAB — GLUCOSE, CAPILLARY
Glucose-Capillary: 202 mg/dL — ABNORMAL HIGH (ref 70–99)
Glucose-Capillary: 216 mg/dL — ABNORMAL HIGH (ref 70–99)
Glucose-Capillary: 236 mg/dL — ABNORMAL HIGH (ref 70–99)
Glucose-Capillary: 245 mg/dL — ABNORMAL HIGH (ref 70–99)
Glucose-Capillary: 261 mg/dL — ABNORMAL HIGH (ref 70–99)
Glucose-Capillary: 265 mg/dL — ABNORMAL HIGH (ref 70–99)

## 2022-11-24 LAB — CALCIUM, IONIZED: Calcium, Ionized, Serum: 4.3 mg/dL — ABNORMAL LOW (ref 4.5–5.6)

## 2022-11-24 MED ORDER — OXYCODONE HCL 5 MG PO TABS
5.0000 mg | ORAL_TABLET | Freq: Four times a day (QID) | ORAL | Status: DC | PRN
  Administered 2022-11-24 – 2022-11-26 (×8): 5 mg via ORAL
  Filled 2022-11-24 (×8): qty 1

## 2022-11-24 MED ORDER — ACETAMINOPHEN 325 MG PO TABS
975.0000 mg | ORAL_TABLET | Freq: Once | ORAL | Status: AC
  Administered 2022-11-24: 975 mg via ORAL
  Filled 2022-11-24: qty 3

## 2022-11-24 MED ORDER — INSULIN GLARGINE-YFGN 100 UNIT/ML ~~LOC~~ SOLN
12.0000 [IU] | Freq: Every day | SUBCUTANEOUS | Status: DC
  Administered 2022-11-24 – 2022-11-26 (×3): 12 [IU] via SUBCUTANEOUS
  Filled 2022-11-24 (×3): qty 0.12

## 2022-11-24 MED ORDER — OXYCODONE HCL 5 MG PO TABS
5.0000 mg | ORAL_TABLET | Freq: Once | ORAL | Status: AC
  Administered 2022-11-24: 5 mg via ORAL
  Filled 2022-11-24: qty 1

## 2022-11-24 NOTE — Inpatient Diabetes Management (Signed)
Inpatient Diabetes Program Recommendations  AACE/ADA: New Consensus Statement on Inpatient Glycemic Control (2015)  Target Ranges:  Prepandial:   less than 140 mg/dL      Peak postprandial:   less than 180 mg/dL (1-2 hours)      Critically ill patients:  140 - 180 mg/dL    Latest Reference Range & Units 11/23/22 07:28 11/23/22 11:56 11/23/22 16:32 11/23/22 20:09  Glucose-Capillary 70 - 99 mg/dL 093 (H)    10 units Semglee @0946  326 (H)  7 units Novolog  122 (H)  3 units Novolog  237 (H)  (H): Data is abnormally high  Latest Reference Range & Units 11/24/22 08:10 11/24/22 11:29  Glucose-Capillary 70 - 99 mg/dL 235 (H)  7 units Novolog  12 units Semglee  216 (H)  5 units Novolog   (H): Data is abnormally high   History: Type 1 diabetes   Home DM Meds: Omnipod insulin pump    Current Orders: Semglee 12 units daily                           Novolog Sensitive Correction Scale/ SSI (0-9 units) TID AC + HS     Novolog 2 units TID with meals     MD- Note Semglee increased this AM  May also consider increasing the Novolog Meal Coverage very slightly to 3 units TID with meals    --Will follow patient during hospitalization--  Ambrose Finland RN, MSN, CDCES Diabetes Coordinator Inpatient Glycemic Control Team Team Pager: [phone] (8a-5p)

## 2022-11-24 NOTE — Progress Notes (Signed)
Subjective: Patient seen and examined at bedside this morning.  The overall pain and swelling in her left hand has improved.  No drainage since dressing change yesterday.    Objective: Vital signs in last 24 hours: Temp:  [97.5 F (36.4 C)-98.6 F (37 C)] 97.5 F (36.4 C) (11/12 0813) Pulse Rate:  [53-63] 58 (11/12 0813) Resp:  [16-18] 16 (11/12 0813) BP: (128-160)/(42-54) 138/53 (11/12 0813) SpO2:  [96 %-100 %] 98 % (11/12 0813)  Intake/Output from previous day: No intake/output data recorded. Intake/Output this shift: Total I/O In: 240 [P.O.:240] Out: -   Recent Labs    11/22/22 0417 11/23/22 0409  HGB 12.2 11.7*   Recent Labs    11/22/22 0417 11/23/22 0409  WBC 5.7 5.9  RBC 4.23 4.00  HCT 36.4 35.3*  PLT 209 234   Recent Labs    11/22/22 0417 11/23/22 0409  NA 133* 138  K 4.2 3.6  CL 105 107  CO2 23 24  BUN 31* 16  CREATININE 0.65 0.41*  GLUCOSE 196* 98  CALCIUM 6.8* 6.9*   No results for input(s): "LABPT", "INR" in the last 72 hours.  Physical Exam: Left upper extremity exam Full composite fist left hand.  Drain removed. No active drainage or fluctuance. No warmth or redness. Mild swelling in digits. Normal sensation LUE. New dressing was applied using Xeroform gauze, gauze wrap,and Ace wrap.  Assessment: S/p incision and drainage of left wrist and hand  Left hand swelling, flexor tenosynovitis, deep fluid collection over the distal radius  Plan:  Continue with abx Follow up with ortho 10 days post op for suture removal Keep dressing clead dry and intact  Amador Cunas CHRISTOPHER 11/24/2022, 12:24 PM

## 2022-11-24 NOTE — Progress Notes (Signed)
MPOA paperwork confirmed, and now on chart for patient's brother Theodoro Clock ([phone])

## 2022-11-24 NOTE — Progress Notes (Signed)
Progress Note    Suzanne Snyder  ZOX:[id number] DOB: [birth year]  DOA: 11/19/2022 PCP: Gracelyn Nurse, MD      Brief Narrative:    Medical records reviewed and are as summarized below:  Suzanne Snyder is a 72 y.o. female with medical history significant for type 1 diabetes mellitus, asthma, tobacco use disorder, peripheral neuropathy, chronic pain, portal vein thrombosis on Eliquis, who presented to the hospital because of painful swelling of the left hand/wrist following a fall at home.  Her glucose was 36 in the emergency department so her insulin pump was taken off.  She was also found to have left hand abscess and left hand flexor tenosynovitis.    Assessment/Plan:   Principal Problem:   Hypoglycemia Active Problems:   Pain and swelling of left wrist   Acute metabolic encephalopathy   Fall   Portal vein obstruction   Suppurative tenosynovitis of flexor tendon of left hand     Body mass index is 17.63 kg/m.   Left volar wrist abscess, left hand suppurative flexor tenosynovitis: S/p I&D of left volar wrist abscess, left carpal tunnel release, left hand and flexor tendon irrigation and debridement on 11/20/2022. No growth on surgical cultures thus far.  Consider switching IV vancomycin and ceftriaxone to Bactrim. Follow-up with orthopedic surgeon   Insulin-dependent diabetes mellitus with hyperglycemia, s/p hypoglycemia: Insulin pump has been held.   Increase insulin glargine from 10 units to 12 units daily.  Continue NovoLog 2 units 3 times daily with meals. Continue NovoLog sliding scale. Will be cautious with escalating doses of insulin because patient reports history of recurrent hypoglycemia.   Acute metabolic encephalopathy: Improved   Hypocalcemia: Continue calcium supplement Hypophosphatemia: Improved with IV sodium phosphate History of severe vitamin D deficiency: Continue high-dose vitamin D supplement.  She said she has been on high-dose  vitamin D 150,000 units 3 times a week for many years.  This was prescribed by her endocrinologist. Vitamin D level 25.43.   S/p fall: PT and OT recommended home health therapy   Portal vein thrombosis: She is on full dose Lovenox.  Eliquis on hold in case patient needs additional surgery.   Restless leg syndrome: Continue Requip.  She said she no longer takes pramipexole.   Chronic neck and back pain: Analgesics as needed for pain    Diet Order             Diet regular Fluid consistency: Thin  Diet effective now                            Consultants: Orthopedic surgeon  Procedures: I&D of left volar wrist abscess, left carpal tunnel release, left hand and flexor tendon irrigation and debridement on 11/20/2022    Medications:    calcium carbonate  1,000 mg of elemental calcium Oral TID WC   cyclobenzaprine  5 mg Oral QHS   docusate sodium  100 mg Oral BID   enoxaparin (LOVENOX) injection  50 mg Subcutaneous Q12H   gabapentin  300 mg Oral TID   insulin aspart  0-5 Units Subcutaneous QHS   insulin aspart  0-9 Units Subcutaneous TID WC   insulin aspart  2 Units Subcutaneous TID WC   insulin glargine-yfgn  12 Units Subcutaneous Daily   rOPINIRole  2 mg Oral QHS   Vitamin D (Ergocalciferol)  150,000 Units Oral Once per day on Monday Wednesday Friday   Continuous Infusions:  cefTRIAXone (  ROCEPHIN)  IV 2 g (11/24/22 0526)   vancomycin 1,000 mg (11/24/22 0959)     Anti-infectives (From admission, onward)    Start     Dose/Rate Route Frequency Ordered Stop   11/20/22 0900  vancomycin (VANCOCIN) IVPB 1000 mg/200 mL premix        1,000 mg 200 mL/hr over 60 Minutes Intravenous Every 24 hours 11/19/22 0825     11/19/22 0845  vancomycin (VANCOREADY) IVPB 1250 mg/250 mL        1,250 mg 166.7 mL/hr over 90 Minutes Intravenous  Once 11/19/22 0812 11/19/22 1312   11/19/22 0545  cefTRIAXone (ROCEPHIN) 2 g in sodium chloride 0.9 % 100 mL IVPB        2 g 200  mL/hr over 30 Minutes Intravenous Every 24 hours 11/19/22 0541 11/26/22 0544              Family Communication/Anticipated D/C date and plan/Code Status   DVT prophylaxis:      Code Status: Full Code  Family Communication: None Disposition Plan: Plan to discharge home   Status is: Inpatient Remains inpatient appropriate because: On IV antibiotics for left hand infection       Subjective:   Interval events noted.  She complains of pain in the left hand.   Objective:    Vitals:   11/23/22 2134 11/23/22 2335 11/24/22 0504 11/24/22 0813  BP:  (!) 128/42 (!) 154/46 (!) 138/53  Pulse:  63 (!) 53 (!) 58  Resp:  18 18 16   Temp:  98.1 F (36.7 C) 97.7 F (36.5 C) (!) 97.5 F (36.4 C)  TempSrc:  Oral Oral   SpO2: 97% 96% 100% 98%  Weight:      Height:       No data found.   Intake/Output Summary (Last 24 hours) at 11/24/2022 1451 Last data filed at 11/24/2022 1120 Gross per 24 hour  Intake 240 ml  Output --  Net 240 ml    Filed Weights   11/19/22 0205 11/20/22 1023  Weight: 52.6 kg 52.6 kg    Exam:  GEN: NAD SKIN: Warm and dry EYES: No pallor or icterus ENT: MMM CV: RRR PULM: CTA B ABD: soft, ND, NT, +BS CNS: AAO x 3, non focal EXT: Dressing on left hand surgical wound is clean, dry and intact    Data Reviewed:   I have personally reviewed following labs and imaging studies:  Labs: Labs show the following:   Basic Metabolic Panel: Recent Labs  Lab 11/19/22 0222 11/20/22 0347 11/21/22 0822 11/22/22 0413 11/22/22 0417 11/23/22 0409  NA 131* 130* 133*  --  133* 138  K 3.9 3.7 4.0  --  4.2 3.6  CL 100 98 101  --  105 107  CO2 24 23 19*  --  23 24  GLUCOSE 36* 158* 281*  --  196* 98  BUN 34* 18 38*  --  31* 16  CREATININE 0.64 0.61 0.77  --  0.65 0.41*  CALCIUM 8.8* 7.8* 6.9*  --  6.8* 6.9*  PHOS  --   --  2.6 1.5*  --  2.5   GFR Estimated Creatinine Clearance: 53.6 mL/min (A) (by C-G formula based on SCr of 0.41 mg/dL  (L)). Liver Function Tests: Recent Labs  Lab 11/19/22 0222 11/20/22 0347 11/21/22 0822 11/22/22 0413  AST 22 13*  --   --   ALT 20 16  --   --   ALKPHOS 58 68  --   --  BILITOT 0.4 0.9  --   --   PROT 6.9 6.5  --   --   ALBUMIN 3.8 3.3* 3.0* 3.0*   No results for input(s): "LIPASE", "AMYLASE" in the last 168 hours. Recent Labs  Lab 11/19/22 1809  AMMONIA 32   Coagulation profile No results for input(s): "INR", "PROTIME" in the last 168 hours.  CBC: Recent Labs  Lab 11/19/22 0222 11/20/22 0347 11/21/22 0822 11/22/22 0417 11/23/22 0409  WBC 10.0 7.5 14.5* 5.7 5.9  NEUTROABS 6.5  --  12.2* 3.0  --   HGB 12.2 13.1 11.3* 12.2 11.7*  HCT 37.3 39.2 34.5* 36.4 35.3*  MCV 88.2 85.6 88.7 86.1 88.3  PLT 225 189 211 209 234   Cardiac Enzymes: No results for input(s): "CKTOTAL", "CKMB", "CKMBINDEX", "TROPONINI" in the last 168 hours. BNP (last 3 results) No results for input(s): "PROBNP" in the last 8760 hours. CBG: Recent Labs  Lab 11/23/22 2114 11/24/22 0058 11/24/22 0420 11/24/22 0810 11/24/22 1129  GLUCAP 184* 245* 265* 261* 216*   D-Dimer: No results for input(s): "DDIMER" in the last 72 hours. Hgb A1c: No results for input(s): "HGBA1C" in the last 72 hours.  Lipid Profile: No results for input(s): "CHOL", "HDL", "LDLCALC", "TRIG", "CHOLHDL", "LDLDIRECT" in the last 72 hours. Thyroid function studies: No results for input(s): "TSH", "T4TOTAL", "T3FREE", "THYROIDAB" in the last 72 hours.  Invalid input(s): "FREET3" Anemia work up: No results for input(s): "VITAMINB12", "FOLATE", "FERRITIN", "TIBC", "IRON", "RETICCTPCT" in the last 72 hours. Sepsis Labs: Recent Labs  Lab 11/20/22 0347 11/21/22 0822 11/22/22 0417 11/23/22 0409  WBC 7.5 14.5* 5.7 5.9    Microbiology Recent Results (from the past 240 hour(s))  Culture, blood (Routine X 2) w Reflex to ID Panel     Status: None   Collection Time: 11/19/22  4:15 PM   Specimen: BLOOD  Result Value Ref  Range Status   Specimen Description BLOOD BLOOD LEFT ARM  Final   Special Requests   Final    BOTTLES DRAWN AEROBIC ONLY Blood Culture results may not be optimal due to an inadequate volume of blood received in culture bottles   Culture   Final    NO GROWTH 5 DAYS Performed at [facility], [address] [place], [place] [address]    Report Status 11/24/2022 FINAL  Final  Culture, blood (Routine X 2) w Reflex to ID Panel     Status: None   Collection Time: 11/19/22  4:20 PM   Specimen: BLOOD  Result Value Ref Range Status   Specimen Description BLOOD BLOOD LEFT ARM  Final   Special Requests   Final    BOTTLES DRAWN AEROBIC AND ANAEROBIC Blood Culture results may not be optimal due to an inadequate volume of blood received in culture bottles   Culture   Final    NO GROWTH 5 DAYS Performed at [facility], [address] [place], [place] [address]    Report Status 11/24/2022 FINAL  Final  Aerobic/Anaerobic Culture w Gram Stain (surgical/deep wound)     Status: None (Preliminary result)   Collection Time: 11/20/22 11:38 AM   Specimen: Path fluid; Body Fluid  Result Value Ref Range Status   Specimen Description ABSCESS  Final   Special Requests LEFT WRIST  Final   Gram Stain   Final    FEW WBC PRESENT,BOTH PMN AND MONONUCLEAR NO ORGANISMS SEEN    Culture   Final    NO GROWTH 4 DAYS NO ANAEROBES ISOLATED;  CULTURE IN PROGRESS FOR 5 DAYS Performed at [facility], [address] [place], [place] [address]    Report Status PENDING  Incomplete  Aerobic/Anaerobic Culture w Gram Stain (surgical/deep wound)     Status: None (Preliminary result)   Collection Time: 11/20/22 11:39 AM   Specimen: Path fluid; Body Fluid  Result Value Ref Range Status   Specimen Description WOUND  Final   Special Requests LEFT WRIST  Final   Gram Stain NO WBC SEEN NO ORGANISMS SEEN   Final   Culture   Final    NO GROWTH 4 DAYS NO ANAEROBES ISOLATED; CULTURE IN PROGRESS FOR 5  DAYS Performed at [facility], [address] [place], [place] [address]    Report Status PENDING  Incomplete  Aerobic/Anaerobic Culture w Gram Stain (surgical/deep wound)     Status: None (Preliminary result)   Collection Time: 11/20/22 11:51 AM   Specimen: Path fluid; Body Fluid  Result Value Ref Range Status   Specimen Description WOUND  Final   Special Requests LEFT HAND PALM FLEXOR TENDON  Final   Gram Stain   Final    RARE WBC PRESENT, PREDOMINANTLY PMN NO ORGANISMS SEEN    Culture   Final    NO GROWTH 4 DAYS NO ANAEROBES ISOLATED; CULTURE IN PROGRESS FOR 5 DAYS Performed at [facility], [address] [place], [place] [address]    Report Status PENDING  Incomplete    Procedures and diagnostic studies:  No results found.             LOS: 5 days   Shey Yott  [facility]   Pager on [email or web address]. If 7PM-7AM, please contact night-coverage at [email or web address]     11/24/2022, 2:51 PM

## 2022-11-24 NOTE — Plan of Care (Signed)

## 2022-11-25 DIAGNOSIS — M65142 Other infective (teno)synovitis, left hand: Secondary | ICD-10-CM | POA: Diagnosis not present

## 2022-11-25 DIAGNOSIS — E162 Hypoglycemia, unspecified: Secondary | ICD-10-CM | POA: Diagnosis not present

## 2022-11-25 LAB — CBC
HCT: 33.1 % — ABNORMAL LOW (ref 36.0–46.0)
Hemoglobin: 10.9 g/dL — ABNORMAL LOW (ref 12.0–15.0)
MCH: 28.8 pg (ref 26.0–34.0)
MCHC: 32.9 g/dL (ref 30.0–36.0)
MCV: 87.3 fL (ref 80.0–100.0)
Platelets: 225 10*3/uL (ref 150–400)
RBC: 3.79 MIL/uL — ABNORMAL LOW (ref 3.87–5.11)
RDW: 14.7 % (ref 11.5–15.5)
WBC: 5.1 10*3/uL (ref 4.0–10.5)
nRBC: 0 % (ref 0.0–0.2)

## 2022-11-25 LAB — AEROBIC/ANAEROBIC CULTURE W GRAM STAIN (SURGICAL/DEEP WOUND)
Culture: NO GROWTH
Culture: NO GROWTH
Culture: NO GROWTH
Gram Stain: NONE SEEN

## 2022-11-25 LAB — CREATININE, SERUM
Creatinine, Ser: 0.72 mg/dL (ref 0.44–1.00)
GFR, Estimated: >60 mL/min (ref >60)

## 2022-11-25 LAB — GLUCOSE, CAPILLARY
Glucose-Capillary: 213 mg/dL — ABNORMAL HIGH (ref 70–99)
Glucose-Capillary: 220 mg/dL — ABNORMAL HIGH (ref 70–99)
Glucose-Capillary: 233 mg/dL — ABNORMAL HIGH (ref 70–99)
Glucose-Capillary: 289 mg/dL — ABNORMAL HIGH (ref 70–99)

## 2022-11-25 MED ORDER — APIXABAN 5 MG PO TABS
5.0000 mg | ORAL_TABLET | Freq: Two times a day (BID) | ORAL | Status: DC
  Administered 2022-11-25 – 2022-11-26 (×3): 5 mg via ORAL
  Filled 2022-11-25 (×3): qty 1

## 2022-11-25 MED ORDER — SULFAMETHOXAZOLE-TRIMETHOPRIM 800-160 MG PO TABS
1.0000 | ORAL_TABLET | Freq: Two times a day (BID) | ORAL | Status: DC
  Administered 2022-11-25 – 2022-11-26 (×3): 1 via ORAL
  Filled 2022-11-25 (×3): qty 1

## 2022-11-25 NOTE — Progress Notes (Signed)
Occupational Therapy Treatment Patient Details Name: Suzanne Snyder MRN: [id number] DOB: 12-15-1950 Today's Date: 11/25/2022   History of present illness Per MD Notes: Suzanne Snyder is a 72 year old female presenting to the emergency department for evaluation of hand pain.  In triage, patient reported that she fell out of her bed last night and injured her hand.  She does report that she hit her head.  Was noted to be confused in triage, so blood sugar was obtained which returned low at 36. CT of hand/wrist showed extensive diffuse soft tissue swelling. No acute fracture or dislocation. Head CT was negative for acute abnormality. She is s/p Left volar wrist abscess I&D left carpal tunnel release left hand flexor tendons irrigation and debridement on 11/21/2022. Patient is NWB in LUE.   OT comments  Chart reviewed, pt greeted in bed, agreeable to OT evaluation with encouragement. Pt reports 8/10 pain throughout L hand, forearm, and elbow. Pt performs functional amb in room to bathroom with supervision, simulated toilet transfers with supervision. Education provided re: LUE positioning for pain relief, decreased edema. Pt requests to return to bed, declines further intervention.Fair adherence to non weight bearing precautions with intermittent vcs required for adherence. Nurse notified regarding pt reported pain levels. OT will follow acutely.        If plan is discharge home, recommend the following:  A little help with walking and/or transfers;A little help with bathing/dressing/bathroom;Assistance with cooking/housework;Assist for transportation;Help with stairs or ramp for entrance   Equipment Recommendations  None recommended by OT    Recommendations for Other Services      Precautions / Restrictions Precautions Precautions: Fall Restrictions Weight Bearing Restrictions: Yes LUE Weight Bearing: Non weight bearing       Mobility Bed Mobility Overal bed mobility: Needs  Assistance Bed Mobility: Supine to Sit, Sit to Supine     Supine to sit: Supervision, HOB elevated, Used rails Sit to supine: Supervision, HOB elevated, Used rails        Transfers Overall transfer level: Needs assistance Equipment used: None Transfers: Sit to/from Stand Sit to Stand: Supervision                 Balance Overall balance assessment: Needs assistance Sitting-balance support: Feet supported, Single extremity supported Sitting balance-Leahy Scale: Good     Standing balance support: No upper extremity supported, During functional activity Standing balance-Leahy Scale: Fair                             ADL either performed or assessed with clinical judgement   ADL Overall ADL's : Needs assistance/impaired Eating/Feeding: Set up;Sitting                   Lower Body Dressing: Maximal assistance Lower Body Dressing Details (indicate cue type and reason): socks Toilet Transfer: Supervision/safety           Functional mobility during ADLs: Supervision/safety (approx 15' in room two attempts)      Extremity/Trunk Assessment              Vision       Perception     Praxis      Cognition Arousal: Alert Behavior During Therapy: Anxious Overall Cognitive Status: No family/caregiver present to determine baseline cognitive functioning Area of Impairment: Safety/judgement                         Safety/Judgement:  Decreased awareness of safety (fair adherence to NWBing)              Exercises Other Exercises Other Exercises: edu re: positioning for LUE to decrese edema, pain managment    Shoulder Instructions       General Comments fair adherence to LUE NWBing precautions    Pertinent Vitals/ Pain       Pain Assessment Pain Assessment: 0-10 Pain Score: 8  Pain Location: LUE- shooting pain up the arm Pain Intervention(s): Limited activity within patient's tolerance, Monitored during session, Patient  requesting pain meds-RN notified, Premedicated before session  Home Living                                          Prior Functioning/Environment              Frequency  Min 1X/week        Progress Toward Goals  OT Goals(current goals can now be found in the care plan section)  Progress towards OT goals: Progressing toward goals  Acute Rehab OT Goals Time For Goal Achievement: 12/07/22 Potential to Achieve Goals: Good  Plan      Co-evaluation                 AM-PAC OT "6 Clicks" Daily Activity     Outcome Measure   Help from another person eating meals?: A Little Help from another person taking care of personal grooming?: A Little Help from another person toileting, which includes using toliet, bedpan, or urinal?: A Little Help from another person bathing (including washing, rinsing, drying)?: A Little Help from another person to put on and taking off regular upper body clothing?: A Little Help from another person to put on and taking off regular lower body clothing?: A Little 6 Click Score: 18    End of Session    OT Visit Diagnosis: Other abnormalities of gait and mobility (R26.89);Pain Pain - Right/Left: Left Pain - part of body: Hand;Arm;Shoulder   Activity Tolerance Patient tolerated treatment well   Patient Left in bed;with call bell/phone within reach;with bed alarm set   Nurse Communication Mobility status;Patient requests pain meds        Time: 1610-9604 OT Time Calculation (min): 11 min  Charges: OT General Charges $OT Visit: 1 Visit OT Treatments $Self Care/Home Management : 8-22 mins  Oleta Mouse, OTD OTR/L  11/25/22, 1:26 PM

## 2022-11-25 NOTE — Progress Notes (Addendum)
Progress Note    Suzanne Snyder  EXB:[id number] DOB: March 31, 1950  DOA: 11/19/2022 PCP: Gracelyn Nurse, MD      Brief Narrative:    Medical records reviewed and are as summarized below:  Suzanne Snyder is a 72 y.o. female with medical history significant for type 1 diabetes mellitus, asthma, tobacco use disorder, peripheral neuropathy, chronic pain, portal vein thrombosis on Eliquis, who presented to the hospital because of painful swelling of the left hand/wrist following a fall at home.  Her glucose was 36 in the emergency department so her insulin pump was taken off.  She was also found to have left hand abscess and left hand flexor tenosynovitis.    Assessment/Plan:   Principal Problem:   Hypoglycemia Active Problems:   Pain and swelling of left wrist   Acute metabolic encephalopathy   Fall   Portal vein obstruction   Suppurative tenosynovitis of flexor tendon of left hand     Body mass index is 17.63 kg/m.   Left volar wrist abscess, left hand suppurative flexor tenosynovitis: S/p I&D of left volar wrist abscess, left carpal tunnel release, left hand and flexor tendon irrigation and debridement on 11/20/2022. No growth on surgical cultures thus . Discontinue IV vancomycin and cefepime.  Start Bactrim Patient is okay for discharge from orthopedic team's standpoint. She requested to speak to orthopedic surgeon.  I notified Mr. Amador Cunas, [place], with orthopedic surgery via secure chat about patient's request.   Insulin-dependent diabetes mellitus with hyperglycemia, s/p hypoglycemia: Insulin pump has been held.   Continue insulin glargine 12 units daily.  Continue NovoLog 2 units 3 times daily with meals. Continue NovoLog sliding scale. Will be cautious with escalating doses of insulin because patient reports history of recurrent hypoglycemia. Patient said she can easily get back on her insulin pump when she gets home.  She said she already had  hypoglycemia because she had not been eating well   Acute metabolic encephalopathy: Improved   Hypocalcemia: Continue calcium supplement Hypophosphatemia: Improved with IV sodium phosphate History of severe vitamin D deficiency: Continue high-dose vitamin D supplement.  She said she has been on high-dose vitamin D 150,000 units 3 times a week for many years.  This was prescribed by her endocrinologist. Vitamin D level 25.43.  Ionized calcium was 4.3 on 11/23/2022.   S/p fall: PT and OT recommended home health therapy   Portal vein thrombosis: She is on full dose Lovenox.  Eliquis on hold in case patient needs additional surgery.   Restless leg syndrome: Continue Requip.  She said she no longer takes pramipexole.   Chronic neck and back pain: Analgesics as needed for pain  Patient had previously been told that she would be discharged home if there were no plans for additional surgery on her left hand. Patient was informed that she was stable ready for discharge today.  However, she said that she did not have the key to her house.  She said her daughter was out of town, somewhere in [place].  She was tearful and said she knew this was going to happen.  She immediately tried to call her daughter. Lucendia Herrlich, case manager, has been notified.    Diet Order             Diet regular Fluid consistency: Thin  Diet effective now  Consultants: Orthopedic surgeon  Procedures: I&D of left volar wrist abscess, left carpal tunnel release, left hand and flexor tendon irrigation and debridement on 11/20/2022    Medications:    apixaban  5 mg Oral BID   calcium carbonate  1,000 mg of elemental calcium Oral TID WC   cyclobenzaprine  5 mg Oral QHS   docusate sodium  100 mg Oral BID   gabapentin  300 mg Oral TID   insulin aspart  0-5 Units Subcutaneous QHS   insulin aspart  0-9 Units Subcutaneous TID WC   insulin aspart  2 Units Subcutaneous TID WC    insulin glargine-yfgn  12 Units Subcutaneous Daily   rOPINIRole  2 mg Oral QHS   sulfamethoxazole-trimethoprim  1 tablet Oral Q12H   Vitamin D (Ergocalciferol)  150,000 Units Oral Once per day on Monday Wednesday Friday   Continuous Infusions:     Anti-infectives (From admission, onward)    Start     Dose/Rate Route Frequency Ordered Stop   11/25/22 1000  sulfamethoxazole-trimethoprim (BACTRIM DS) 800-160 MG per tablet 1 tablet        1 tablet Oral Every 12 hours 11/25/22 0729 12/02/22 0959   11/20/22 0900  vancomycin (VANCOCIN) IVPB 1000 mg/200 mL premix  Status:  Discontinued        1,000 mg 200 mL/hr over 60 Minutes Intravenous Every 24 hours 11/19/22 0825 11/25/22 0729   11/19/22 0845  vancomycin (VANCOREADY) IVPB 1250 mg/250 mL        1,250 mg 166.7 mL/hr over 90 Minutes Intravenous  Once 11/19/22 0812 11/19/22 1312   11/19/22 0545  cefTRIAXone (ROCEPHIN) 2 g in sodium chloride 0.9 % 100 mL IVPB        2 g 200 mL/hr over 30 Minutes Intravenous Every 24 hours 11/19/22 0541 11/25/22 0602              Family Communication/Anticipated D/C date and plan/Code Status   DVT prophylaxis:  apixaban (ELIQUIS) tablet 5 mg     Code Status: Full Code  Family Communication: None Disposition Plan: Plan to discharge home   Status is: Inpatient Remains inpatient appropriate because: On IV antibiotics for left hand infection       Subjective:   She complains of pain in the left hand.  She requested to speak with the orthopedic surgeon.   Objective:    Vitals:   11/24/22 1611 11/24/22 2033 11/25/22 0455 11/25/22 0817  BP: (!) 144/50 (!) 160/61 (!) 120/52 (!) 146/41  Pulse: (!) 59 62 (!) 51 (!) 54  Resp: 18  17 16   Temp: 98.3 F (36.8 C) 98.6 F (37 C) (!) 97.5 F (36.4 C) 97.8 F (36.6 C)  TempSrc: Oral   Oral  SpO2: 97% 97% 100% 97%  Weight:      Height:       No data found.   Intake/Output Summary (Last 24 hours) at 11/25/2022 1459 Last data  filed at 11/25/2022 0500 Gross per 24 hour  Intake 480 ml  Output --  Net 480 ml    Filed Weights   11/19/22 0205 11/20/22 1023  Weight: 52.6 kg 52.6 kg    Exam:  GEN: NAD SKIN: Warm and dry EYES: EOMI ENT: MMM CV: RRR PULM: CTA B ABD: soft, ND, NT, +BS CNS: AAO x 3, non focal EXT: Dressing on left hand is intact     Data Reviewed:   I have personally reviewed following labs and imaging studies:  Labs: Labs show  the following:   Basic Metabolic Panel: Recent Labs  Lab 11/19/22 0222 11/20/22 0347 11/21/22 0822 11/22/22 0413 11/22/22 0417 11/23/22 0409 11/25/22 0549  NA 131* 130* 133*  --  133* 138  --   K 3.9 3.7 4.0  --  4.2 3.6  --   CL 100 98 101  --  105 107  --   CO2 24 23 19*  --  23 24  --   GLUCOSE 36* 158* 281*  --  196* 98  --   BUN 34* 18 38*  --  31* 16  --   CREATININE 0.64 0.61 0.77  --  0.65 0.41* 0.72  CALCIUM 8.8* 7.8* 6.9*  --  6.8* 6.9*  --   PHOS  --   --  2.6 1.5*  --  2.5  --    GFR Estimated Creatinine Clearance: 53.6 mL/min (by C-G formula based on SCr of 0.72 mg/dL). Liver Function Tests: Recent Labs  Lab 11/19/22 0222 11/20/22 0347 11/21/22 0822 11/22/22 0413  AST 22 13*  --   --   ALT 20 16  --   --   ALKPHOS 58 68  --   --   BILITOT 0.4 0.9  --   --   PROT 6.9 6.5  --   --   ALBUMIN 3.8 3.3* 3.0* 3.0*   No results for input(s): "LIPASE", "AMYLASE" in the last 168 hours. Recent Labs  Lab 11/19/22 1809  AMMONIA 32   Coagulation profile No results for input(s): "INR", "PROTIME" in the last 168 hours.  CBC: Recent Labs  Lab 11/19/22 0222 11/20/22 0347 11/21/22 0822 11/22/22 0417 11/23/22 0409 11/25/22 0549  WBC 10.0 7.5 14.5* 5.7 5.9 5.1  NEUTROABS 6.5  --  12.2* 3.0  --   --   HGB 12.2 13.1 11.3* 12.2 11.7* 10.9*  HCT 37.3 39.2 34.5* 36.4 35.3* 33.1*  MCV 88.2 85.6 88.7 86.1 88.3 87.3  PLT 225 189 211 209 234 225   Cardiac Enzymes: No results for input(s): "CKTOTAL", "CKMB", "CKMBINDEX",  "TROPONINI" in the last 168 hours. BNP (last 3 results) No results for input(s): "PROBNP" in the last 8760 hours. CBG: Recent Labs  Lab 11/24/22 1129 11/24/22 1612 11/24/22 2032 11/25/22 0829 11/25/22 1230  GLUCAP 216* 236* 202* 213* 220*   D-Dimer: No results for input(s): "DDIMER" in the last 72 hours. Hgb A1c: No results for input(s): "HGBA1C" in the last 72 hours.  Lipid Profile: No results for input(s): "CHOL", "HDL", "LDLCALC", "TRIG", "CHOLHDL", "LDLDIRECT" in the last 72 hours. Thyroid function studies: No results for input(s): "TSH", "T4TOTAL", "T3FREE", "THYROIDAB" in the last 72 hours.  Invalid input(s): "FREET3" Anemia work up: No results for input(s): "VITAMINB12", "FOLATE", "FERRITIN", "TIBC", "IRON", "RETICCTPCT" in the last 72 hours. Sepsis Labs: Recent Labs  Lab 11/21/22 0822 11/22/22 0417 11/23/22 0409 11/25/22 0549  WBC 14.5* 5.7 5.9 5.1    Microbiology Recent Results (from the past 240 hour(s))  Culture, blood (Routine X 2) w Reflex to ID Panel     Status: None   Collection Time: 11/19/22  4:15 PM   Specimen: BLOOD  Result Value Ref Range Status   Specimen Description BLOOD BLOOD LEFT ARM  Final   Special Requests   Final    BOTTLES DRAWN AEROBIC ONLY Blood Culture results may not be optimal due to an inadequate volume of blood received in culture bottles   Culture   Final    NO GROWTH 5 DAYS Performed at [facility], [address] [place], [place] [address]    Report Status 11/24/2022 FINAL  Final  Culture, blood (Routine X 2) w Reflex to ID Panel     Status: None   Collection Time: 11/19/22  4:20 PM   Specimen: BLOOD  Result Value Ref Range Status   Specimen Description BLOOD BLOOD LEFT ARM  Final   Special Requests   Final    BOTTLES DRAWN AEROBIC AND ANAEROBIC Blood Culture results may not be optimal due to an inadequate volume of blood received in culture bottles   Culture   Final    NO GROWTH 5 DAYS Performed at  [facility], [address] [place], [place] [address]    Report Status 11/24/2022 FINAL  Final  Aerobic/Anaerobic Culture w Gram Stain (surgical/deep wound)     Status: None   Collection Time: 11/20/22 11:38 AM   Specimen: Path fluid; Body Fluid  Result Value Ref Range Status   Specimen Description ABSCESS  Final   Special Requests LEFT WRIST  Final   Gram Stain   Final    FEW WBC PRESENT,BOTH PMN AND MONONUCLEAR NO ORGANISMS SEEN    Culture   Final    No growth aerobically or anaerobically. Performed at [facility], [address] [place], [place] [address]    Report Status 11/25/2022 FINAL  Final  Aerobic/Anaerobic Culture w Gram Stain (surgical/deep wound)     Status: None   Collection Time: 11/20/22 11:39 AM   Specimen: Path fluid; Body Fluid  Result Value Ref Range Status   Specimen Description WOUND  Final   Special Requests LEFT WRIST  Final   Gram Stain NO WBC SEEN NO ORGANISMS SEEN   Final   Culture   Final    No growth aerobically or anaerobically. Performed at [facility], [address] [place], [place] [address]    Report Status 11/25/2022 FINAL  Final  Aerobic/Anaerobic Culture w Gram Stain (surgical/deep wound)     Status: None   Collection Time: 11/20/22 11:51 AM   Specimen: Path fluid; Body Fluid  Result Value Ref Range Status   Specimen Description WOUND  Final   Special Requests LEFT HAND PALM FLEXOR TENDON  Final   Gram Stain   Final    RARE WBC PRESENT, PREDOMINANTLY PMN NO ORGANISMS SEEN    Culture   Final    No growth aerobically or anaerobically. Performed at [facility], [address], [place], [place] [address]    Report Status 11/25/2022 FINAL  Final    Procedures and diagnostic studies:  No results found.             LOS: 6 days   [place]  [facility]   Pager on [email or web address]. If 7PM-7AM, please contact night-coverage at [email or web address]     11/25/2022, 2:59 PM

## 2022-11-25 NOTE — Plan of Care (Signed)

## 2022-11-25 NOTE — Progress Notes (Signed)
Subjective: Patient seen and examined at bedside this morning.  The overall pain and swelling in her left hand has improved.    Objective: Vital signs in last 24 hours: Temp:  [97.5 F (36.4 C)-98.6 F (37 C)] 97.5 F (36.4 C) (11/13 0455) Pulse Rate:  [51-62] 51 (11/13 0455) Resp:  [16-18] 17 (11/13 0455) BP: (120-160)/(50-61) 120/52 (11/13 0455) SpO2:  [97 %-100 %] 100 % (11/13 0455)  Intake/Output from previous day: 11/12 0701 - 11/13 0700 In: 720 [P.O.:720] Out: -  Intake/Output this shift: No intake/output data recorded.  Recent Labs    11/23/22 0409 11/25/22 0549  HGB 11.7* 10.9*   Recent Labs    11/23/22 0409 11/25/22 0549  WBC 5.9 5.1  RBC 4.00 3.79*  HCT 35.3* 33.1*  PLT 234 225   Recent Labs    11/23/22 0409 11/25/22 0549  NA 138  --   K 3.6  --   CL 107  --   CO2 24  --   BUN 16  --   CREATININE 0.41* 0.72  GLUCOSE 98  --   CALCIUM 6.9*  --    No results for input(s): "LABPT", "INR" in the last 72 hours.  Physical Exam: Left upper extremity exam Full composite fist left hand.  Dressing clean dry and intact  Assessment: S/p incision and drainage of left wrist and hand  Left hand swelling, flexor tenosynovitis, deep fluid collection over the distal radius  Plan:  Continue with abx Follow up with ortho in 1 week for suture removal Keep dressing clead dry and intact until follow up   Amador Cunas CHRISTOPHER 11/25/2022, 8:08 AM

## 2022-11-25 NOTE — Inpatient Diabetes Management (Signed)
Inpatient Diabetes Program Recommendations  AACE/ADA: New Consensus Statement on Inpatient Glycemic Control (2015)  Target Ranges:  Prepandial:   less than 140 mg/dL      Peak postprandial:   less than 180 mg/dL (1-2 hours)      Critically ill patients:  140 - 180 mg/dL    Latest Reference Range & Units 11/24/22 08:10 11/24/22 11:29 11/24/22 16:12 11/24/22 20:32  Glucose-Capillary 70 - 99 mg/dL 308 (H) 657 (H) 846 (H) 202 (H)  (H): Data is abnormally high  Latest Reference Range & Units 11/25/22 08:29 11/25/22 12:30  Glucose-Capillary 70 - 99 mg/dL 962 (H) 952 (H)  (H): Data is abnormally high   History: Type 1 diabetes   Home DM Meds: Omnipod insulin pump    Current Orders: Semglee 12 units daily                           Novolog Sensitive Correction Scale/ SSI (0-9 units) TID AC + HS                           Novolog 2 units TID with meals         MD- Please consider:  1. Increase Semglee slightly to 14 units daily   2. Increase the Novolog Meal Coverage very slightly to 3 units TID with meals    --Will follow patient during hospitalization--  Ambrose Finland RN, MSN, CDCES Diabetes Coordinator Inpatient Glycemic Control Team Team Pager: [phone] (8a-5p)

## 2022-11-25 NOTE — Progress Notes (Signed)
Pt c/o pain regimen not being adequate. No changes made to pain meds per MD.

## 2022-11-26 DIAGNOSIS — E162 Hypoglycemia, unspecified: Secondary | ICD-10-CM | POA: Diagnosis not present

## 2022-11-26 LAB — GLUCOSE, CAPILLARY
Glucose-Capillary: 279 mg/dL — ABNORMAL HIGH (ref 70–99)
Glucose-Capillary: 333 mg/dL — ABNORMAL HIGH (ref 70–99)

## 2022-11-26 MED ORDER — INSULIN GLARGINE-YFGN 100 UNIT/ML ~~LOC~~ SOLN: 16.0000 [IU] | Freq: Every day | SUBCUTANEOUS | Status: DC

## 2022-11-26 MED ORDER — SULFAMETHOXAZOLE-TRIMETHOPRIM 800-160 MG PO TABS: 1.0000 | ORAL_TABLET | Freq: Two times a day (BID) | ORAL | 0 refills | Status: AC

## 2022-11-26 MED ORDER — CALCIUM CARBONATE ANTACID 500 MG PO CHEW
1.0000 | CHEWABLE_TABLET | Freq: Three times a day (TID) | ORAL | Status: DC
  Administered 2022-11-26: 200 mg via ORAL
  Filled 2022-11-26: qty 1

## 2022-11-26 MED ORDER — CALCIUM CARBONATE ANTACID 500 MG PO CHEW: 1.0000 | CHEWABLE_TABLET | Freq: Three times a day (TID) | ORAL | 0 refills | Status: DC

## 2022-11-26 MED ORDER — INSULIN ASPART 100 UNIT/ML IJ SOLN
5.0000 [IU] | Freq: Three times a day (TID) | INTRAMUSCULAR | Status: DC
  Administered 2022-11-26: 5 [IU] via SUBCUTANEOUS
  Filled 2022-11-26: qty 1

## 2022-11-26 MED ORDER — OXYCODONE HCL 5 MG PO TABS: 5.0000 mg | ORAL_TABLET | Freq: Four times a day (QID) | ORAL | 0 refills | Status: DC | PRN

## 2022-11-26 NOTE — TOC Transition Note (Signed)
Transition of Care [facility]) - CM/SW Discharge Note   Patient Details  Name: Suzanne Snyder MRN: [id number] Date of Birth: 1950/05/04  Transition of Care [facility]) CM/SW Contact:  Garret Reddish, RN Phone Number: 11/26/2022, 11:55 AM   Clinical Narrative:    Chart reviewed.  Patient has orders for discharge today.    I have informed patient that PT has recommended Home Health PT on discharge.  Patient is agreeable to home health PT and RN on discharge.  Patient does not have a home health preference.   I have Adelina Mings with [facility] to accept home health referral for home health PT and RN.   Patient reports that she has several walkers at home, wheel chair, cane, BSC and shower chair at home.    Patient informs me that she needs assistance with transportation to get home.    I have informed patient that TOC will provide Cab ticket for discharge home.   I have informed staff nurse of the above information.     Final next level of care: Home w Home Health Services Barriers to Discharge: No Barriers Identified   Patient Goals and CMS Choice CMS Medicare.gov Compare Post Acute Care list provided to:: Patient Choice offered to / list presented to : Patient  Discharge Placement                    Name of family member notified: Patient does not want familiy notified Patient and family notified of of transfer: 11/26/22  Discharge Plan and Services Additional resources added to the After Visit Summary for       Post Acute Care Choice: Home Health                    HH Arranged: RN, PT   Date HH Agency Contacted:  ([facility]) Time [facility] Agency Contacted: 1130 Representative spoke with at [facility] Agency: Adelina Mings  Social Determinants of Health (SDOH) Interventions SDOH Screenings   Food Insecurity: No Food Insecurity (11/19/2022)  Housing: Low Risk  (11/19/2022)  Transportation Needs: No Transportation Needs (11/19/2022)  Utilities: Not At Risk (11/19/2022)   Depression (PHQ2-9): Low Risk  (03/10/2022)  Financial Resource Strain: Low Risk  (07/01/2021)   Received from [facility], [facility]  Physical Activity: Inactive (07/01/2021)   Received from [facility], [facility]  Social Connections: Socially Isolated (07/01/2021)   Received from [facility], [facility]  Stress: Stress Concern Present (07/01/2021)   [facility] from [facility], [facility]  Tobacco Use: High Risk (11/20/2022)     Readmission Risk Interventions    05/07/2022    2:44 PM  Readmission Risk Prevention Plan  Transportation Screening Complete  Medication Review (RN Care Manager) Complete  HRI or Home Care Consult Complete  SW Recovery Care/Counseling Consult Complete  Palliative Care Screening Not Applicable

## 2022-11-26 NOTE — Discharge Instructions (Signed)
[facility] will service you for Home Health RN and Physical Therapy.

## 2022-11-26 NOTE — Progress Notes (Signed)
Physical Therapy Treatment Patient Details Name: Suzanne Snyder MRN: [id number] DOB: [birth year] Today's Date: 11/26/2022   History of Present Illness 72 year old female presenting with L hand hand pain.  In triage, patient reported that she fell out of her bed and injured her hand, does report that she hit her head.  Was noted to be confused in triage, so blood sugar was obtained which returned low at 36. CT of hand/wrist showed extensive diffuse soft tissue swelling. No acute fracture or dislocation. Head CT was negative for acute abnormality. She is s/p Left volar wrist abscess I&D left carpal tunnel release left hand flexor tendons irrigation and debridement on 11/21/2022. Patient is NWB in LUE.    PT Comments  Pt pleasant and talkative t/o PT session.  Initially only wanting to do limited in room activity, but did agree to do more functional gait training in the hallway with gentle encouragement.  She ultimately did 3 loops around the nurses' station with relatively consistent cadence.  Minimal cuing for SPC use, steppage and cadence work but overall near baseline and moving well.  Pt will benefit from continued PT to address functional limitations.   If plan is discharge home, recommend the following: A little help with walking and/or transfers;Assistance with cooking/housework;Assist for transportation;Help with stairs or ramp for entrance;A little help with bathing/dressing/bathroom   Can travel by private vehicle        Equipment Recommendations  Cane    Recommendations for Other Services       Precautions / Restrictions Precautions Precautions: Fall Restrictions Weight Bearing Restrictions: Yes     Mobility  Bed Mobility Overal bed mobility: Modified Independent Bed Mobility: Supine to Sit, Sit to Supine           General bed mobility comments: Good confidence and mobility, no assist needed    Transfers Overall transfer level: Needs assistance Equipment used:  None Transfers: Sit to/from Stand Sit to Stand: Supervision           General transfer comment: Pt was able to rise to standing without use of L UE, good confidence, guarded once up but w/o LOBs    Ambulation/Gait Ambulation/Gait assistance: Contact guard assist Gait Distance (Feet): 500 Feet Assistive device: Straight cane         General Gait Details: Pt ambulates with SPC in RUE, min cuing needed to display more consistent cadence, but pt able to manage all obstacles, maintain prolonged bout of ambulation while holding conversation and showing good relative confidence.   Stairs             Wheelchair Mobility     Tilt Bed    Modified Rankin (Stroke Patients Only)       Balance Overall balance assessment: Needs assistance Sitting-balance support: Feet supported, Single extremity supported Sitting balance-Leahy Scale: Good       Standing balance-Leahy Scale: Good                              Cognition   Behavior During Therapy: WFL for tasks assessed/performed Overall Cognitive Status: Within Functional Limits for tasks assessed                                 General Comments: Pt initially not wanting to do a lot but more willing to participate with PT with mild encouragement  Exercises      General Comments General comments (skin integrity, edema, etc.): Pt not wanting to do a lot today, but was pleased to have done well with a prolonged bout of ambulation      Pertinent Vitals/Pain Pain Assessment Pain Assessment: Faces Faces Pain Scale: Hurts little more Pain Location: Pt continues to report consistent acute on chronic joint pain t/o body    Home Living                          Prior Function            PT Goals (current goals can now be found in the care plan section) Progress towards PT goals: Progressing toward goals    Frequency    Min 1X/week      PT Plan       Co-evaluation              AM-PAC PT "6 Clicks" Mobility   Outcome Measure  Help needed turning from your back to your side while in a flat bed without using bedrails?: None Help needed moving from lying on your back to sitting on the side of a flat bed without using bedrails?: None Help needed moving to and from a bed to a chair (including a wheelchair)?: A Little Help needed standing up from a chair using your arms (e.g., wheelchair or bedside chair)?: A Little Help needed to walk in hospital room?: A Little Help needed climbing 3-5 steps with a railing? : A Little 6 Click Score: 20    End of Session Equipment Utilized During Treatment: Gait belt Activity Tolerance: Patient tolerated treatment well Patient left: in bed;with call bell/phone within reach   PT Visit Diagnosis: Muscle weakness (generalized) (M62.81);Other abnormalities of gait and mobility (R26.89);Unsteadiness on feet (R26.81)     Time: 3244-0102 PT Time Calculation (min) (ACUTE ONLY): 18 min  Charges:    $Gait Training: 8-22 mins PT General Charges $$ ACUTE PT VISIT: 1 Visit                     Malachi Pro, DPT 11/26/2022, 10:25 AM

## 2022-11-26 NOTE — Inpatient Diabetes Management (Addendum)
Inpatient Diabetes Program Recommendations  AACE/ADA: New Consensus Statement on Inpatient Glycemic Control (2015)  Target Ranges:  Prepandial:   less than 140 mg/dL      Peak postprandial:   less than 180 mg/dL (1-2 hours)      Critically ill patients:  140 - 180 mg/dL    Latest Reference Range & Units 11/25/22 08:29 11/25/22 12:30 11/25/22 15:51 11/25/22 20:00  Glucose-Capillary 70 - 99 mg/dL 161 (H)  5 units Novolog  12 units Semglee  220 (H)  5 units Novolog  233 (H)  5 units Novolog  289 (H)  5 units Novolog   (H): Data is abnormally high  Latest Reference Range & Units 11/26/22 07:40  Glucose-Capillary 70 - 99 mg/dL 096 (H)  (H): Data is abnormally high   History: Type 1 diabetes   Home DM Meds: Omnipod insulin pump    Current Orders: Semglee 12 units daily                           Novolog Sensitive Correction Scale/ SSI (0-9 units) TID AC + HS                           Novolog 2 units TID with meals         MD- Please consider:   1. Increase Semglee slightly to 14 units daily   2. Increase the Novolog Meal Coverage very slightly to 3 units TID with meals   Addendum 11:50am--Dr. Allena Katz asked me to see pt prior to d/c today to review restarting home insulin pump when she goes home today.  Met w/ pt at bedside.  She was wide awake and sat up in bed when I arrived.  Discussed with pt that Dr. Allena Katz wants her to resume her insulin pump when she goes home.  Pt's response was "Of course I will restart my pump".  Pt told me she has all her pump supplies at home.  Discussed w/ pt that she received 12 units Semglee insulin this AM--explained to pt that Semglee insulin is long acting 24hr insulin and will be in her system until tomorrow AM.  I asked pt to not resume her insulin pump until the very early AM hours tomorrow AM 11/15.  Pt agreeable.  Asked pt to be vigilant for HYPO.  Pt stated she has CBG meter at home.         --Will follow patient during  hospitalization--  Ambrose Finland RN, MSN, CDCES Diabetes Coordinator Inpatient Glycemic Control Team Team Pager: [phone] (8a-5p)

## 2022-11-26 NOTE — Care Management Important Message (Signed)
Important Message  Patient Details  Name: Suzanne Snyder MRN: [id number] Date of Birth: Jun 15, 1950   Important Message Given:  Yes - Medicare and T[employer]are IM     [name] A Asra Gambrel 11/26/2022, 11:45 AM

## 2022-11-26 NOTE — Discharge Summary (Signed)
Physician Discharge Summary   Patient: Suzanne Snyder MRN: [id number] DOB: 04-19-1950  Admit date:     11/19/2022  Discharge date: 11/26/22  Discharge Physician: Enedina Finner   PCP: Gracelyn Nurse, MD   Recommendations at discharge:   follow-up orthopedic surgery in one week. Home health PT and RN arranged follow-up with PC P Dr. Letitia Libra resume your insulin pump as per your regimen and monitor sugars at home  Discharge Diagnoses: Principal Problem:   Hypoglycemia Active Problems:   Pain and swelling of left wrist   Acute metabolic encephalopathy   Fall   Portal vein obstruction   Suppurative tenosynovitis of flexor tendon of left hand  Suzanne Snyder is a 72 y.o. female with medical history significant for type 1 diabetes mellitus, asthma, tobacco use disorder, peripheral neuropathy, chronic pain, portal vein thrombosis on Eliquis, who presented to the hospital because of painful swelling of the left hand/wrist following a fall at home.  Her glucose was 36 in the emergency department so her insulin pump was taken off.   She was also found to have left hand abscess and left hand flexor tenosynovitis.  Left volar wrist abscess, left hand suppurative flexor tenosynovitis: -- S/p I&D of left volar wrist abscess, left carpal tunnel release, left hand and flexor tendon irrigation and debridement on 11/20/2022. --No growth on surgical cultures thus . --Discontinue IV vancomycin and cefepime.  Start Bactrim fro total 7 days --Patient is okay for discharge from orthopedic team's standpoint.  Insulin-dependent diabetes mellitus with hyperglycemia, s/p hypoglycemia: Insulin pump has been held.   --Continue insulin glargine 12 units daily.  Continue NovoLog 2 units 3 times daily with meals. Continue NovoLog sliding scale. --Patient said she can easily get back on her insulin pump when she gets home. Patient is advised to cautiously manage her insulin pump and check her sugars regularly  and eat appropriate diet. -- Follows with endocrinology Dr. Tedd Sias  Acute metabolic encephalopathy: Improved    Hypocalcemia: Continue calcium supplement Hypophosphatemia: Improved with IV sodium phosphate History of severe vitamin D deficiency: Continue high-dose vitamin D supplement.  She said she has been on high-dose vitamin D 150,000 units 3 times a week for many years.  This was prescribed by her endocrinologist. Vitamin D level 25.43.  Ionized calcium was 4.3 on 11/23/2022.   S/p fall: PT and OT recommended home health therapy   Chronic Portal vein thrombosis: patient is placed back on eliquis at discharge. She tells me she is inconsistent with taking eliquis. I have discussed with her to let her PCP new about it and follow the recommendations. -- Patient had MR abdomen and April 2024 that did show evidence of chronic portal vein thrombosis.  Restless leg syndrome: Continue Requip.      Chronic neck and back pain: Analgesics as needed for pain  Patient has acquired her house keys from her daughter who dropped it off yesterday. Social worker to arrange home health PT and RN. Patient will be discharged home today. Discharge plan was discussed with patient she is agreeable.       Pain control - [employer] Controlled Substance Reporting System database was reviewed. and patient was instructed, not to drive, operate heavy machinery, perform activities at heights, swimming or participation in water activities or provide baby-sitting services while on Pain, Sleep and Anxiety Medications; until their outpatient Physician has advised to do so again. Also recommended to not to take more than prescribed Pain, Sleep and Anxiety Medications.  Consultants:  orthopedic Procedures performed: incision and drainage left hand Disposition: Home health Diet recommendation:  Discharge Diet Orders (From admission, onward)     Start     Ordered   11/26/22 0000  Diet - low sodium heart healthy         11/26/22 1127           Cardiac and Carb modified diet DISCHARGE MEDICATION: Allergies as of 11/26/2022       Reactions   Cat Hair Extract Shortness Of Breath   Pregabalin Anaphylaxis   Erythromycin Other (See Comments)   Severe stomach upset        Medication List     STOP taking these medications    acyclovir 400 MG tablet Commonly known as: ZOVIRAX   Probiotic 250 MG Caps       TAKE these medications    acetaminophen 325 MG tablet Commonly known as: TYLENOL Take 2 tablets (650 mg total) by mouth every 6 (six) hours as needed for mild pain, moderate pain, fever or headache (or Fever >/= 101).   albuterol 108 (90 Base) MCG/ACT inhaler Commonly known as: VENTOLIN HFA Inhale into the lungs every 6 (six) hours as needed for wheezing or shortness of breath.   albuterol (2.5 MG/3ML) 0.083% nebulizer solution Commonly known as: PROVENTIL Take 2.5 mg by nebulization every 4 (four) hours as needed for wheezing or shortness of breath.   apixaban 5 MG Tabs tablet Commonly known as: Eliquis Take 1 tablet (5 mg total) by mouth 2 (two) times daily. What changed: Another medication with the same name was removed. Continue taking this medication, and follow the directions you see here.   bisacodyl 5 MG EC tablet Commonly known as: DULCOLAX Take 2 tablets (10 mg total) by mouth daily as needed for moderate constipation.   bisacodyl 10 MG suppository Commonly known as: DULCOLAX Place 1 suppository (10 mg total) rectally daily as needed for severe constipation.   calcium carbonate 500 MG chewable tablet Commonly known as: TUMS - dosed in mg elemental calcium Chew 1 tablet (200 mg of elemental calcium total) by mouth 3 (three) times daily with meals for 4 days.   celecoxib 100 MG capsule Commonly known as: CELEBREX Take 100 mg by mouth at bedtime.   clobetasol 0.05 % external solution Commonly known as: TEMOVATE Apply topically to itchy areas of scalp twice  daily as needed for flares. Avoid applying to face, groin, and axilla. Use as directed.   clobetasol cream 0.05 % Commonly known as: TEMOVATE Apply topically to arms and hands twice daily as needed for eczema flares. Avoid applying to face, groin, and axilla. Use as directed.   clonazePAM 0.5 MG tablet Commonly known as: KLONOPIN Take 1 tablet (0.5 mg total) by mouth 2 (two) times daily as needed for anxiety.   cyclobenzaprine 10 MG tablet Commonly known as: FLEXERIL Take 10 mg by mouth 3 (three) times daily as needed for muscle spasms.   denosumab 60 MG/ML Sosy injection Commonly known as: PROLIA Inject 60 mg into the skin every 6 (six) months.   Fish Oil 1000 MG Caps Take 2 capsules by mouth daily.   furosemide 20 MG tablet Commonly known as: LASIX Take 20 mg by mouth daily as needed for edema or fluid.   gabapentin 300 MG capsule Commonly known as: NEURONTIN Take 900 mg by mouth 2 (two) times daily.   hydrocortisone 2.5 % cream Apply to itchy rash around eyes twice daily until clear.   hydrOXYzine 25  MG tablet Commonly known as: ATARAX Take 50 mg by mouth 2 (two) times daily. What changed: Another medication with the same name was removed. Continue taking this medication, and follow the directions you see here.   insulin aspart 100 UNIT/ML injection Commonly known as: novoLOG Inject 40 Units into the skin daily. AS INSTRUCTED VIA OMNIPOD INSULIN PUMP   ipratropium 0.02 % nebulizer solution Commonly known as: ATROVENT Take 0.5 mg by nebulization every 6 (six) hours as needed for wheezing or shortness of breath.   iron polysaccharides 150 MG capsule Commonly known as: NIFEREX Take 1 capsule (150 mg total) by mouth daily.   MULTIVITAMIN ADULT PO Take by mouth 2 (two) times daily.   mupirocin ointment 2 % Commonly known as: BACTROBAN Apply 1 Application topically daily as needed.   oxyCODONE 5 MG immediate release tablet Commonly known as: Oxy  IR/ROXICODONE Take 1 tablet (5 mg total) by mouth every 6 (six) hours as needed for moderate pain (pain score 4-6).   pantoprazole 40 MG tablet Commonly known as: Protonix Take 1 tablet (40 mg total) by mouth daily.   polyethylene glycol 17 g packet Commonly known as: MiraLax Take 17 g by mouth daily.   polyvinyl alcohol 1.4 % ophthalmic solution Commonly known as: LIQUIFILM TEARS Place 1 drop into both eyes as needed for dry eyes.   rOPINIRole 1 MG tablet Commonly known as: REQUIP Take 1.5 mg by mouth at bedtime.   sulfamethoxazole-trimethoprim 800-160 MG tablet Commonly known as: BACTRIM DS Take 1 tablet by mouth every 12 (twelve) hours for 12 doses.   terbinafine 1 % cream Commonly known as: LAMISIL Apply topically to feet twice daily for tinea pedis   Vitamin D (Ergocalciferol) 1.25 MG (50000 UNIT) Caps capsule Commonly known as: DRISDOL Take 150,000 Units by mouth 3 (three) times a week.   Wixela Inhub 500-50 MCG/ACT Aepb Generic drug: fluticasone-salmeterol Inhale 1 puff into the lungs in the morning and at bedtime.        Follow-up Information     Gracelyn Nurse, MD Follow up.   Specialty: Internal Medicine Why: hospital follow up Contact information: [address] [facility] [place] [address] [phone]         Evon Slack, PA-C Follow up in 1 week(s).   Specialties: Orthopedic Surgery, Emergency Medicine Contact information: [address] [place] [address] [phone]                Discharge Exam: Ceasar Mons Weights   11/19/22 0205 11/20/22 1023  Weight: 52.6 kg 52.6 kg   Left hand bandage present  Condition at discharge: fair  The results of significant diagnostics from this hospitalization (including imaging, microbiology, ancillary and laboratory) are listed below for reference.   Imaging Studies: CT ABDOMEN PELVIS W CONTRAST  Result Date: 11/20/2022 CLINICAL DATA:  Portal vein thrombosis.  EXAM: CT ABDOMEN AND PELVIS WITH CONTRAST TECHNIQUE: Multidetector CT imaging of the abdomen and pelvis was performed using the standard protocol following bolus administration of intravenous contrast. RADIATION DOSE REDUCTION: This exam was performed according to the departmental dose-optimization program which includes automated exposure control, adjustment of the mA and/or kV according to patient size and/or use of iterative reconstruction technique. CONTRAST:  OMNIPAQUE IOHEXOL 300 MG/ML  SOLN COMPARISON:  Abdominal MRI 06/22/2022. CT 05/06/2022 FINDINGS: Lower chest: Dependent atelectasis in the lower lobes. Breathing motion artifact. Hepatobiliary: Post cholecystectomy with unchanged intrahepatic biliary ductal dilatation. The previous right lobe liver lesion is not  seen on the current exam. Pancreas: Sequela of chronic pancreatitis with diffuse pancreatic calcifications. Atrophy of the pancreatic tail. No acute peripancreatic inflammation. Spleen: Normal in size without focal abnormality. Adrenals/Urinary Tract: No adrenal nodule. No hydronephrosis or suspicious renal abnormality. Small cyst in the upper left kidney. No further follow-up imaging is recommended. Unremarkable urinary bladder. Stomach/Bowel: Detailed bowel assessment is limited in the absence of enteric contrast and paucity of intra-abdominal fat. Fluid within the distal stomach with mild gastric hyperemia. No evidence of small-bowel obstruction. Moderate to large volume of stool in the colon. Chain sutures in the rectum. The appendix is not visualized. 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 The intrahepatic portal veins are patent. No intraluminal filling defects in the portal vein. There is high-grade narrowing of the splenic vein at the portal splenic confluence but no intraluminal filling defects. The superior mesenteric vein is patent. Moderate aortic atherosclerosis. No  abdominopelvic adenopathy. Reproductive: Pessary in place.  No adnexal mass. Other: No ascites.  No free air. Musculoskeletal: Surgical hardware in both hips. Lumbar scoliosis and degenerative change. No acute osseous findings. IMPRESSION: 1. 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 2. High-grade narrowing of the splenic vein at the portal splenic confluence but no intraluminal filling defects. 3. Sequela of chronic pancreatitis. 4. Moderate to large volume of stool in the colon. 5. The previous right lobe liver lesion is not seen on the current exam. Aortic Atherosclerosis (ICD10-I70.0). Electronically Signed   By: Narda Rutherford M.D.   On: 11/20/2022 02:04   MR WRIST LEFT WO CONTRAST  Result Date: 11/19/2022 CLINICAL DATA:  Septic arthritis suspected. Larey Seat out of bed last night injuring left hand. Significant swelling. EXAM: MR OF THE LEFT WRIST WITHOUT CONTRAST TECHNIQUE: Multiplanar, multisequence MR imaging of the left wrist was performed. No intravenous contrast was administered. The technologist reports the patient was coached to attempt and complete exam. Best scans achievable for patient's cooperation and pain tolerance. No axial T2 weighted sequence was performed. COMPARISON:  Left hand and wrist radiographs 11/19/2022, left hand radiographs 06/25/2021 FINDINGS: Despite efforts by the technologist and patient, moderate motion artifact is present on today's exam and could not be eliminated. This reduces exam sensitivity and specificity. Ligaments: No definite scapholunate or lunotriquetral ligament tear. Triangular fibrocartilage: There is narrowing of the distal radial and ulnar joint spaces with the scaphoid and lunate. There is high-grade attenuation of the central radial aspect of the triangular fibrocartilage, likely chronic tearing associated with ulnar-lunate abutment. There is intermediate T2 signal within the peripheral aspect of the triangular fibrocartilage  complex, likely chronic sprain. Tendons: Limited evaluation given lack of axial T2 weighted sequence. There is moderate subcutaneous fat edema and swelling throughout the dorsal wrist extending through the visualized level of the metacarpals, dorsal to the dorsal extensor tendon compartments. No definitive dorsal extensor compartment tenosynovitis is seen. There does appear to be intermediate T1 and intermediate T2 signal within some of the fourth dorsal extensor compartment tendons at the level of the radiocarpal joint (axial series 1026, image 12 and sagittal series 1010 image 20). This is compatible with tendinosis. There is mild-to-moderate fluid within the tendon sheaths of the flexor tendons at the level of the distal radial metaphysis (coronal series 1042 images 19 through 23). Deep to this, there is teardrop shaped fluid in between the flexor tendons and the distal volar radius in a region measuring up to approximately 1.8 x 0.9 x 3.1 cm (transverse by AP  by craniocaudal, coronal series 1042, image 17 and sagittal series 1010, image 19). There is also fluid within the more distal flexor tendon sheaths seen on coronal images at the level of the metacarpal shafts, moderate at the level of the fourth and fifth digits and mild at the first through third digits (coronal series 1034 images 19 through 26). Carpal tunnel/median nerve: Within the limitations of lack of axial T2 weighted images, no gross abnormality is seen of the medial nerve or carpal tunnel. Guyon's canal: No gross abnormality. Joint/cartilage: There are mild radiocarpal and midcarpal joint effusions. Moderate fluid extending into the distal radioulnar joint. Diffuse high-grade partial to full-thickness cartilage loss within the radiocarpal, triscaphe, and first through fifth carpometacarpal joints. There are multiple marginal degenerative subchondral cystic changes, greatest within the peripheral capitate and distal triquetrum. Bones/carpal  alignment: Normal alignment. No acute fracture is seen. Other: None. IMPRESSION: 1. No fracture this exam is limited by patient motion artifact and inability to complete the entire study. No axial T2 weighted sequences were performed. 2. Moderate subcutaneous fat edema and swelling throughout the dorsal wrist extending through the visualized level of the metacarpals, dorsal to the dorsal extensor tendon compartments. This is nonspecific and may represent cellulitis. 3. Mild-to-moderate flexor tenosynovitis at the level of the distal radial metaphysis. Deep to this, there is teardrop shaped fluid in between the flexor tendons and the distal volar radius in a region measuring up to approximately 1.8 x 0.9 x 3.1 cm. 4. There is also tenosynovitis within the more distal flexor tendon sheaths at the level of the metacarpal shafts, moderate at the level of the fourth and fifth digits and mild at the first through third digits. 5. No definitive dorsal extensor compartment tenosynovitis is seen. There does appear to be tendinosis of some of the fourth dorsal extensor compartment tendons at the level of the radiocarpal joint based on limited coronal images and axial T1 weighted images. 6. Mild radiocarpal and midcarpal joint effusions. Moderate fluid extending into the distal radioulnar joint. 7. Diffuse high-grade partial to full-thickness cartilage loss within the radiocarpal, triscaphe, and first through fifth carpometacarpal joints. 8. High-grade attenuation of the central radial aspect of the triangular fibrocartilage, likely chronic tearing associated with ulnar-lunate abutment. 9. MRI cannot determine the presence or absence of infection within the tenosynovitis or joint fluid described above. No cortical erosion or marrow edema is seen to indicate acute osteomyelitis. Electronically Signed   By: Neita Garnet M.D.   On: 11/19/2022 12:27   US Venous Img Upper Left (DVT Study)  Result Date: 11/19/2022 CLINICAL  DATA:  72 year old female with left side hand and wrist swelling. Pain and edema. EXAM: LEFT UPPER EXTREMITY VENOUS DOPPLER ULTRASOUND TECHNIQUE: Gray-scale sonography with graded compression, as well as color Doppler and duplex ultrasound were performed to evaluate the upper extremity deep venous system from the level of the subclavian vein and including the jugular, axillary, basilic, radial, ulnar and upper cephalic vein. Spectral Doppler was utilized to evaluate flow at rest and with distal augmentation maneuvers. COMPARISON:  Left shoulder CT 04/30/2021. Previous right upper extremity venous Doppler 12/23/2020. FINDINGS: Contralateral Subclavian Vein: Respiratory phasicity is normal and symmetric with the symptomatic side. No evidence of thrombus. Normal compressibility. Internal Jugular Vein: No evidence of thrombus. Normal compressibility, respiratory phasicity and response to augmentation. Subclavian Vein: No evidence of thrombus. Normal compressibility, respiratory phasicity and response to augmentation. Axillary Vein: No evidence of thrombus. Normal compressibility, respiratory phasicity and response to augmentation. Cephalic Vein: No  evidence of thrombus. Normal compressibility, respiratory phasicity and response to augmentation. Basilic Vein: No evidence of thrombus. Normal compressibility, respiratory phasicity and response to augmentation. Brachial Veins: No evidence of thrombus. Normal compressibility, respiratory phasicity and response to augmentation. Radial Veins: No evidence of thrombus. Normal compressibility, respiratory phasicity and response to augmentation. Ulnar Veins: No evidence of thrombus. Normal compressibility, respiratory phasicity and response to augmentation. Venous Reflux:  None visualized. Other Findings:  None visualized. IMPRESSION: No evidence of DVT within the left upper extremity. Electronically Signed   By: Odessa Fleming M.D.   On: 11/19/2022 04:54   CT Cervical Spine Wo  Contrast  Result Date: 11/19/2022 CLINICAL DATA:  Neck trauma (Age >= 65y).  Fall EXAM: CT CERVICAL SPINE WITHOUT CONTRAST TECHNIQUE: Multidetector CT imaging of the cervical spine was performed without intravenous contrast. Multiplanar CT image reconstructions were also generated. RADIATION DOSE REDUCTION: This exam was performed according to the departmental dose-optimization program which includes automated exposure control, adjustment of the mA and/or kV according to patient size and/or use of iterative reconstruction technique. COMPARISON:  12/02/2021 FINDINGS: Alignment: 3 mm of anterolisthesis of C3 on C4 and C7 on T1. Skull base and vertebrae: No acute fracture. No primary bone lesion or focal pathologic process. Soft tissues and spinal canal: No prevertebral fluid or swelling. No visible canal hematoma. Disc levels: Diffuse advanced degenerative disc disease and moderate bilateral degenerative facet disease. Upper chest: No acute findings Other: None IMPRESSION: Moderate to advanced degenerative disc and facet disease. No acute bony abnormality. Electronically Signed   By: Charlett Nose M.D.   On: 11/19/2022 03:03   CT Head Wo Contrast  Result Date: 11/19/2022 CLINICAL DATA:  Head trauma, minor (Age >= 65y), fall EXAM: CT HEAD WITHOUT CONTRAST TECHNIQUE: Contiguous axial images were obtained from the base of the skull through the vertex without intravenous contrast. RADIATION DOSE REDUCTION: This exam was performed according to the departmental dose-optimization program which includes automated exposure control, adjustment of the mA and/or kV according to patient size and/or use of iterative reconstruction technique. COMPARISON:  12/02/2021 FINDINGS: Brain: No acute intracranial abnormality. Specifically, no hemorrhage, hydrocephalus, mass lesion, acute infarction, or significant intracranial injury. Mild age related atrophy. Vascular: No hyperdense vessel or unexpected calcification. Skull: No acute  calvarial abnormality. Sinuses/Orbits: No acute findings Other: None IMPRESSION: No acute intracranial abnormality. Electronically Signed   By: Charlett Nose M.D.   On: 11/19/2022 03:01   DG Wrist Complete Left  Result Date: 11/19/2022 CLINICAL DATA:  Fall, left wrist injury and swelling EXAM: LEFT WRIST - COMPLETE 3+ VIEW COMPARISON:  None Available. FINDINGS: Normal alignment. No acute fracture or dislocation. Joint spaces are preserved. Degenerative chondrocalcinosis of the triangular fibrocartilage complex. There is diffuse soft tissue swelling surrounding the left wrist. IMPRESSION: 1. Diffuse soft tissue swelling. No acute fracture or dislocation. Electronically Signed   By: Helyn Numbers M.D.   On: 11/19/2022 02:40   DG Hand Complete Left  Result Date: 11/19/2022 CLINICAL DATA:  Left hand and wrist swelling following fall and left hand injury EXAM: LEFT HAND - COMPLETE 3+ VIEW COMPARISON:  None Available. FINDINGS: The fingers are held in fixed flexion on all presented images. No acute fracture or dislocation. Degenerative calcification of the triangular fibrocartilage complex. Extensive diffuse soft tissue swelling of the left hand and wrist noted. IMPRESSION: 1. Extensive diffuse soft tissue swelling. No acute fracture or dislocation. Electronically Signed   By: Helyn Numbers M.D.   On: 11/19/2022 02:39  Microbiology: Results for orders placed or performed during the hospital encounter of 11/19/22  Culture, blood (Routine X 2) w Reflex to ID Panel     Status: None   Collection Time: 11/19/22  4:15 PM   Specimen: BLOOD  Result Value Ref Range Status   Specimen Description BLOOD BLOOD LEFT ARM  Final   Special Requests   Final    BOTTLES DRAWN AEROBIC ONLY Blood Culture results may not be optimal due to an inadequate volume of blood received in culture bottles   Culture   Final    NO GROWTH 5 DAYS Performed at [facility], [address] [place], [place] [address]     Report Status 11/24/2022 FINAL  Final  Culture, blood (Routine X 2) w Reflex to ID Panel     Status: None   Collection Time: 11/19/22  4:20 PM   Specimen: BLOOD  Result Value Ref Range Status   Specimen Description BLOOD BLOOD LEFT ARM  Final   Special Requests   Final    BOTTLES DRAWN AEROBIC AND ANAEROBIC Blood Culture results may not be optimal due to an inadequate volume of blood received in culture bottles   Culture   Final    NO GROWTH 5 DAYS Performed at [facility], [address] [place], [place] [address]    Report Status 11/24/2022 FINAL  Final  Aerobic/Anaerobic Culture w Gram Stain (surgical/deep wound)     Status: None   Collection Time: 11/20/22 11:38 AM   Specimen: Path fluid; Body Fluid  Result Value Ref Range Status   Specimen Description ABSCESS  Final   Special Requests LEFT WRIST  Final   Gram Stain   Final    FEW WBC PRESENT,BOTH PMN AND MONONUCLEAR NO ORGANISMS SEEN    Culture   Final    No growth aerobically or anaerobically. Performed at [facility], [address] [place], [place] [address]    Report Status 11/25/2022 FINAL  Final  Aerobic/Anaerobic Culture w Gram Stain (surgical/deep wound)     Status: None   Collection Time: 11/20/22 11:39 AM   Specimen: Path fluid; Body Fluid  Result Value Ref Range Status   Specimen Description WOUND  Final   Special Requests LEFT WRIST  Final   Gram Stain NO WBC SEEN NO ORGANISMS SEEN   Final   Culture   Final    No growth aerobically or anaerobically. Performed at [facility], [address], [place], [place] [address]    Report Status 11/25/2022 FINAL  Final  Aerobic/Anaerobic Culture w Gram Stain (surgical/deep wound)     Status: None   Collection Time: 11/20/22 11:51 AM   Specimen: Path fluid; Body Fluid  Result Value Ref Range Status   Specimen Description WOUND  Final   Special Requests LEFT HAND PALM FLEXOR TENDON  Final   Gram Stain   Final    RARE WBC PRESENT,  PREDOMINANTLY PMN NO ORGANISMS SEEN    Culture   Final    No growth aerobically or anaerobically. Performed at [facility], [address], [place], [place] [address]    Report Status 11/25/2022 FINAL  Final    Labs: CBC: Recent Labs  Lab 11/20/22 0347 11/21/22 0822 11/22/22 0417 11/23/22 0409 11/25/22 0549  WBC 7.5 14.5* 5.7 5.9 5.1  NEUTROABS  --  12.2* 3.0  --   --   HGB 13.1 11.3* 12.2 11.7* 10.9*  HCT 39.2 34.5* 36.4 35.3*  33.1*  MCV 85.6 88.7 86.1 88.3 87.3  PLT 189 211 209 234 225   Basic Metabolic Panel: Recent Labs  Lab 11/20/22 0347 11/21/22 0822 11/22/22 0413 11/22/22 0417 11/23/22 0409 11/25/22 0549  NA 130* 133*  --  133* 138  --   K 3.7 4.0  --  4.2 3.6  --   CL 98 101  --  105 107  --   CO2 23 19*  --  23 24  --   GLUCOSE 158* 281*  --  196* 98  --   BUN 18 38*  --  31* 16  --   CREATININE 0.61 0.77  --  0.65 0.41* 0.72  CALCIUM 7.8* 6.9*  --  6.8* 6.9*  --   PHOS  --  2.6 1.5*  --  2.5  --    Liver Function Tests: Recent Labs  Lab 11/20/22 0347 11/21/22 0822 11/22/22 0413  AST 13*  --   --   ALT 16  --   --   ALKPHOS 68  --   --   BILITOT 0.9  --   --   PROT 6.5  --   --   ALBUMIN 3.3* 3.0* 3.0*   CBG: Recent Labs  Lab 11/25/22 0829 11/25/22 1230 11/25/22 1551 11/25/22 2000 11/26/22 0740  GLUCAP 213* 220* 233* 289* 279*    Discharge time spent: greater than 30 minutes.  Signed: Enedina Finner, MD [facility] 11/26/2022

## 2022-11-29 ENCOUNTER — Emergency Department
Admission: EM | Admit: 2022-11-29 | Discharge: 2022-11-29 | Disposition: A | Discharge status: Home / Self Care | Payer: Medicare Other | Attending: Emergency Medicine | Admitting: Emergency Medicine

## 2022-11-29 ENCOUNTER — Inpatient Hospital Stay (HOSPITAL_COMMUNITY)
Admission: RE | Admit: 2022-11-29 | Discharge: 2022-12-01 | DRG: 557 | Disposition: A | Discharge status: Home / Self Care | Payer: Medicare Other | Source: Other Acute Inpatient Hospital | Attending: Internal Medicine | Admitting: Internal Medicine

## 2022-11-29 ENCOUNTER — Encounter (HOSPITAL_COMMUNITY): Payer: Self-pay

## 2022-11-29 ENCOUNTER — Emergency Department: Payer: Medicare Other

## 2022-11-29 ENCOUNTER — Encounter (HOSPITAL_COMMUNITY): Payer: Self-pay | Admitting: Family Medicine

## 2022-11-29 ENCOUNTER — Other Ambulatory Visit: Payer: Self-pay

## 2022-11-29 ENCOUNTER — Encounter: Payer: Self-pay | Admitting: Emergency Medicine

## 2022-11-29 DIAGNOSIS — Z9841 Cataract extraction status, right eye: Secondary | ICD-10-CM

## 2022-11-29 DIAGNOSIS — Z66 Do not resuscitate: Secondary | ICD-10-CM | POA: Diagnosis present

## 2022-11-29 DIAGNOSIS — Z7901 Long term (current) use of anticoagulants: Secondary | ICD-10-CM | POA: Diagnosis not present

## 2022-11-29 DIAGNOSIS — J410 Simple chronic bronchitis: Secondary | ICD-10-CM | POA: Diagnosis not present

## 2022-11-29 DIAGNOSIS — E1040 Type 1 diabetes mellitus with diabetic neuropathy, unspecified: Secondary | ICD-10-CM | POA: Diagnosis not present

## 2022-11-29 DIAGNOSIS — Z9641 Presence of insulin pump (external) (internal): Secondary | ICD-10-CM | POA: Diagnosis not present

## 2022-11-29 DIAGNOSIS — Z91048 Other nonmedicinal substance allergy status: Secondary | ICD-10-CM

## 2022-11-29 DIAGNOSIS — M7989 Other specified soft tissue disorders: Secondary | ICD-10-CM | POA: Insufficient documentation

## 2022-11-29 DIAGNOSIS — Z9889 Other specified postprocedural states: Secondary | ICD-10-CM

## 2022-11-29 DIAGNOSIS — R930 Abnormal findings on diagnostic imaging of skull and head, not elsewhere classified: Secondary | ICD-10-CM | POA: Diagnosis not present

## 2022-11-29 DIAGNOSIS — Z96642 Presence of left artificial hip joint: Secondary | ICD-10-CM | POA: Diagnosis present

## 2022-11-29 DIAGNOSIS — J45909 Unspecified asthma, uncomplicated: Secondary | ICD-10-CM | POA: Insufficient documentation

## 2022-11-29 DIAGNOSIS — G2581 Restless legs syndrome: Secondary | ICD-10-CM

## 2022-11-29 DIAGNOSIS — Z794 Long term (current) use of insulin: Secondary | ICD-10-CM | POA: Diagnosis not present

## 2022-11-29 DIAGNOSIS — D72819 Decreased white blood cell count, unspecified: Secondary | ICD-10-CM | POA: Diagnosis not present

## 2022-11-29 DIAGNOSIS — E872 Acidosis, unspecified: Secondary | ICD-10-CM | POA: Diagnosis not present

## 2022-11-29 DIAGNOSIS — M65142 Other infective (teno)synovitis, left hand: Principal | ICD-10-CM | POA: Diagnosis present

## 2022-11-29 DIAGNOSIS — Z888 Allergy status to other drugs, medicaments and biological substances status: Secondary | ICD-10-CM

## 2022-11-29 DIAGNOSIS — Z720 Tobacco use: Secondary | ICD-10-CM | POA: Diagnosis present

## 2022-11-29 DIAGNOSIS — Z9842 Cataract extraction status, left eye: Secondary | ICD-10-CM

## 2022-11-29 DIAGNOSIS — I871 Compression of vein: Secondary | ICD-10-CM | POA: Diagnosis present

## 2022-11-29 DIAGNOSIS — E1043 Type 1 diabetes mellitus with diabetic autonomic (poly)neuropathy: Secondary | ICD-10-CM | POA: Diagnosis not present

## 2022-11-29 DIAGNOSIS — Z961 Presence of intraocular lens: Secondary | ICD-10-CM | POA: Diagnosis present

## 2022-11-29 DIAGNOSIS — I81 Portal vein thrombosis: Secondary | ICD-10-CM | POA: Diagnosis present

## 2022-11-29 DIAGNOSIS — M810 Age-related osteoporosis without current pathological fracture: Secondary | ICD-10-CM | POA: Diagnosis not present

## 2022-11-29 DIAGNOSIS — Z881 Allergy status to other antibiotic agents status: Secondary | ICD-10-CM

## 2022-11-29 DIAGNOSIS — G8929 Other chronic pain: Secondary | ICD-10-CM | POA: Diagnosis present

## 2022-11-29 DIAGNOSIS — F411 Generalized anxiety disorder: Secondary | ICD-10-CM | POA: Diagnosis present

## 2022-11-29 DIAGNOSIS — F17210 Nicotine dependence, cigarettes, uncomplicated: Secondary | ICD-10-CM | POA: Diagnosis present

## 2022-11-29 DIAGNOSIS — Z79899 Other long term (current) drug therapy: Secondary | ICD-10-CM | POA: Diagnosis not present

## 2022-11-29 DIAGNOSIS — Z820 Family history of epilepsy and other diseases of the nervous system: Secondary | ICD-10-CM

## 2022-11-29 DIAGNOSIS — B353 Tinea pedis: Secondary | ICD-10-CM

## 2022-11-29 DIAGNOSIS — Z87892 Personal history of anaphylaxis: Secondary | ICD-10-CM

## 2022-11-29 LAB — CBG MONITORING, ED
Glucose-Capillary: 112 mg/dL — ABNORMAL HIGH (ref 70–99)
Glucose-Capillary: 95 mg/dL (ref 70–99)

## 2022-11-29 LAB — COMPREHENSIVE METABOLIC PANEL
ALT: 20 U/L (ref 0–44)
AST: 16 U/L (ref 15–41)
Albumin: 3.7 g/dL (ref 3.5–5.0)
Alkaline Phosphatase: 73 U/L (ref 38–126)
Anion gap: 11 (ref 5–15)
BUN: 48 mg/dL — ABNORMAL HIGH (ref 8–23)
CO2: 20 mmol/L — ABNORMAL LOW (ref 22–32)
Calcium: 9 mg/dL (ref 8.9–10.3)
Chloride: 101 mmol/L (ref 98–111)
Creatinine, Ser: 1.04 mg/dL — ABNORMAL HIGH (ref 0.44–1.00)
GFR, Estimated: 57 mL/min — ABNORMAL LOW (ref >60)
Glucose, Bld: 100 mg/dL — ABNORMAL HIGH (ref 70–99)
Potassium: 4.5 mmol/L (ref 3.5–5.1)
Sodium: 132 mmol/L — ABNORMAL LOW (ref 135–145)
Total Bilirubin: 0.2 mg/dL (ref <1.2)
Total Protein: 6.8 g/dL (ref 6.5–8.1)

## 2022-11-29 LAB — CBC WITH DIFFERENTIAL/PLATELET
Abs Immature Granulocytes: 0.03 10*3/uL (ref 0.00–0.07)
Basophils Absolute: 0 10*3/uL (ref 0.0–0.1)
Basophils Relative: 1 %
Eosinophils Absolute: 0.4 10*3/uL (ref 0.0–0.5)
Eosinophils Relative: 5 %
HCT: 37.5 % (ref 36.0–46.0)
Hemoglobin: 12.2 g/dL (ref 12.0–15.0)
Immature Granulocytes: 0 %
Lymphocytes Relative: 25 %
Lymphs Abs: 2 10*3/uL (ref 0.7–4.0)
MCH: 28.4 pg (ref 26.0–34.0)
MCHC: 32.5 g/dL (ref 30.0–36.0)
MCV: 87.4 fL (ref 80.0–100.0)
Monocytes Absolute: 1.1 10*3/uL — ABNORMAL HIGH (ref 0.1–1.0)
Monocytes Relative: 14 %
Neutro Abs: 4.6 10*3/uL (ref 1.7–7.7)
Neutrophils Relative %: 55 %
Platelets: 360 10*3/uL (ref 150–400)
RBC: 4.29 MIL/uL (ref 3.87–5.11)
RDW: 15.1 % (ref 11.5–15.5)
WBC: 8.2 10*3/uL (ref 4.0–10.5)
nRBC: 0 % (ref 0.0–0.2)

## 2022-11-29 LAB — GLUCOSE, CAPILLARY
Glucose-Capillary: 119 mg/dL — ABNORMAL HIGH (ref 70–99)
Glucose-Capillary: 367 mg/dL — ABNORMAL HIGH (ref 70–99)

## 2022-11-29 LAB — SEDIMENTATION RATE: Sed Rate: 34 mm/h — ABNORMAL HIGH (ref 0–22)

## 2022-11-29 LAB — LACTIC ACID, PLASMA: Lactic Acid, Venous: 1.2 mmol/L (ref 0.5–1.9)

## 2022-11-29 LAB — C-REACTIVE PROTEIN: CRP: 1 mg/dL — ABNORMAL HIGH (ref <1.0)

## 2022-11-29 MED ORDER — POLYETHYLENE GLYCOL 3350 17 G PO PACK: 17.0000 g | PACK | Freq: Every day | ORAL | Status: DC | PRN

## 2022-11-29 MED ORDER — ONDANSETRON HCL 4 MG/2ML IJ SOLN: 4.0000 mg | Freq: Four times a day (QID) | INTRAMUSCULAR | Status: DC | PRN

## 2022-11-29 MED ORDER — VANCOMYCIN HCL IN DEXTROSE 1-5 GM/200ML-% IV SOLN: 1000.0000 mg | Freq: Once | INTRAVENOUS | Status: DC

## 2022-11-29 MED ORDER — HYDROCODONE-ACETAMINOPHEN 5-325 MG PO TABS
1.0000 | ORAL_TABLET | ORAL | Status: DC | PRN
  Administered 2022-11-29 – 2022-12-01 (×8): 2 via ORAL
  Filled 2022-11-29 (×9): qty 2

## 2022-11-29 MED ORDER — ALBUTEROL SULFATE (2.5 MG/3ML) 0.083% IN NEBU: 2.5000 mg | INHALATION_SOLUTION | RESPIRATORY_TRACT | Status: DC | PRN

## 2022-11-29 MED ORDER — CYCLOBENZAPRINE HCL 10 MG PO TABS: 10.0000 mg | ORAL_TABLET | Freq: Three times a day (TID) | ORAL | Status: DC | PRN

## 2022-11-29 MED ORDER — ACETAMINOPHEN 325 MG PO TABS: 650.0000 mg | ORAL_TABLET | Freq: Four times a day (QID) | ORAL | Status: DC | PRN

## 2022-11-29 MED ORDER — SODIUM CHLORIDE 0.9 % IV SOLN
2.0000 g | Freq: Two times a day (BID) | INTRAVENOUS | Status: DC
  Administered 2022-11-29 – 2022-12-01 (×5): 2 g via INTRAVENOUS
  Filled 2022-11-29 (×5): qty 12.5

## 2022-11-29 MED ORDER — VANCOMYCIN HCL 750 MG/150ML IV SOLN
750.0000 mg | INTRAVENOUS | Status: DC
  Administered 2022-11-29 – 2022-11-30 (×2): 750 mg via INTRAVENOUS
  Filled 2022-11-29 (×2): qty 150

## 2022-11-29 MED ORDER — MORPHINE SULFATE (PF) 2 MG/ML IV SOLN
2.0000 mg | INTRAVENOUS | Status: DC | PRN
  Administered 2022-11-29 – 2022-11-30 (×4): 2 mg via INTRAVENOUS
  Filled 2022-11-29 (×4): qty 1

## 2022-11-29 MED ORDER — INSULIN ASPART 100 UNIT/ML IJ SOLN
0.0000 [IU] | Freq: Three times a day (TID) | INTRAMUSCULAR | Status: DC
  Administered 2022-11-30: 2 [IU] via SUBCUTANEOUS

## 2022-11-29 MED ORDER — INSULIN GLARGINE-YFGN 100 UNIT/ML ~~LOC~~ SOLN
5.0000 [IU] | Freq: Every day | SUBCUTANEOUS | Status: DC
  Administered 2022-11-29 – 2022-11-30 (×2): 5 [IU] via SUBCUTANEOUS
  Filled 2022-11-29 (×3): qty 0.05

## 2022-11-29 MED ORDER — INSULIN ASPART 100 UNIT/ML IJ SOLN
2.0000 [IU] | Freq: Once | INTRAMUSCULAR | Status: AC
  Administered 2022-11-29: 2 [IU] via SUBCUTANEOUS

## 2022-11-29 MED ORDER — POLYSACCHARIDE IRON COMPLEX 150 MG PO CAPS
150.0000 mg | ORAL_CAPSULE | Freq: Every day | ORAL | Status: DC
  Administered 2022-11-29 – 2022-12-01 (×3): 150 mg via ORAL
  Filled 2022-11-29 (×3): qty 1

## 2022-11-29 MED ORDER — ACETAMINOPHEN 650 MG RE SUPP: 650.0000 mg | Freq: Four times a day (QID) | RECTAL | Status: DC | PRN

## 2022-11-29 MED ORDER — IPRATROPIUM BROMIDE 0.02 % IN SOLN: 0.5000 mg | Freq: Four times a day (QID) | RESPIRATORY_TRACT | Status: DC | PRN

## 2022-11-29 MED ORDER — MORPHINE SULFATE (PF) 4 MG/ML IV SOLN
4.0000 mg | Freq: Once | INTRAVENOUS | Status: AC
  Administered 2022-11-29: 4 mg via INTRAVENOUS
  Filled 2022-11-29: qty 1

## 2022-11-29 MED ORDER — INSULIN ASPART 100 UNIT/ML IJ SOLN: 0.0000 [IU] | Freq: Three times a day (TID) | INTRAMUSCULAR | Status: DC

## 2022-11-29 MED ORDER — CEFEPIME HCL 2 G IV SOLR
2.0000 g | Freq: Once | INTRAVENOUS | Status: AC
  Administered 2022-11-29: 2 g via INTRAVENOUS
  Filled 2022-11-29: qty 12.5

## 2022-11-29 MED ORDER — ONDANSETRON HCL 4 MG PO TABS: 4.0000 mg | ORAL_TABLET | Freq: Four times a day (QID) | ORAL | Status: DC | PRN

## 2022-11-29 MED ORDER — FENTANYL CITRATE PF 50 MCG/ML IJ SOSY
50.0000 ug | PREFILLED_SYRINGE | Freq: Once | INTRAMUSCULAR | Status: AC
  Administered 2022-11-29: 50 ug via INTRAVENOUS
  Filled 2022-11-29: qty 1

## 2022-11-29 MED ORDER — INSULIN ASPART 100 UNIT/ML IJ SOLN
0.0000 [IU] | Freq: Three times a day (TID) | INTRAMUSCULAR | Status: DC
Start: 2022-11-29 — End: 2022-11-29

## 2022-11-29 MED ORDER — METRONIDAZOLE 500 MG/100ML IV SOLN
500.0000 mg | Freq: Two times a day (BID) | INTRAVENOUS | Status: DC
  Administered 2022-11-29 – 2022-12-01 (×4): 500 mg via INTRAVENOUS
  Filled 2022-11-29 (×4): qty 100

## 2022-11-29 MED ORDER — CLONAZEPAM 0.5 MG PO TABS: 0.5000 mg | ORAL_TABLET | Freq: Two times a day (BID) | ORAL | Status: DC | PRN

## 2022-11-29 MED ORDER — INSULIN PUMP
SUBCUTANEOUS | Status: DC
  Filled 2022-11-29: qty 1

## 2022-11-29 MED ORDER — MORPHINE SULFATE (PF) 4 MG/ML IV SOLN
4.0000 mg | Freq: Once | INTRAVENOUS | Status: AC
Start: 2022-11-29 — End: 2022-11-29
  Administered 2022-11-29: 4 mg via INTRAVENOUS
  Filled 2022-11-29: qty 1

## 2022-11-29 MED ORDER — PANTOPRAZOLE SODIUM 40 MG PO TBEC
40.0000 mg | DELAYED_RELEASE_TABLET | Freq: Every day | ORAL | Status: DC
  Administered 2022-11-29 – 2022-12-01 (×3): 40 mg via ORAL
  Filled 2022-11-29 (×3): qty 1

## 2022-11-29 MED ORDER — NICOTINE 21 MG/24HR TD PT24
21.0000 mg | MEDICATED_PATCH | Freq: Every day | TRANSDERMAL | Status: DC
  Administered 2022-11-29 – 2022-12-01 (×3): 21 mg via TRANSDERMAL
  Filled 2022-11-29 (×3): qty 1

## 2022-11-29 MED ORDER — HYDROXYZINE HCL 25 MG PO TABS
50.0000 mg | ORAL_TABLET | Freq: Two times a day (BID) | ORAL | Status: DC
  Administered 2022-11-29 – 2022-12-01 (×5): 50 mg via ORAL
  Filled 2022-11-29 (×5): qty 2

## 2022-11-29 MED ORDER — ROPINIROLE HCL 0.5 MG PO TABS
1.5000 mg | ORAL_TABLET | Freq: Every day | ORAL | Status: DC
  Administered 2022-11-29 – 2022-12-01 (×3): 1.5 mg via ORAL
  Filled 2022-11-29 (×3): qty 3

## 2022-11-29 MED ORDER — METRONIDAZOLE 500 MG/100ML IV SOLN
500.0000 mg | Freq: Once | INTRAVENOUS | Status: AC
Start: 2022-11-29 — End: 2022-11-29
  Administered 2022-11-29: 500 mg via INTRAVENOUS
  Filled 2022-11-29: qty 100

## 2022-11-29 MED ORDER — GABAPENTIN 300 MG PO CAPS
600.0000 mg | ORAL_CAPSULE | Freq: Two times a day (BID) | ORAL | Status: DC
  Administered 2022-11-29 – 2022-12-01 (×5): 600 mg via ORAL
  Filled 2022-11-29 (×5): qty 2

## 2022-11-29 MED ORDER — INSULIN ASPART 100 UNIT/ML IJ SOLN
2.0000 [IU] | Freq: Three times a day (TID) | INTRAMUSCULAR | Status: DC
  Administered 2022-11-30 – 2022-12-01 (×6): 2 [IU] via SUBCUTANEOUS

## 2022-11-29 MED ORDER — INSULIN PUMP: SUBCUTANEOUS | Status: DC

## 2022-11-29 NOTE — ED Triage Notes (Signed)
Patient with recent surgical debridement to left hand/wrist.  States she was discharged 2 days ago; since then, the hand has swollen significantly, finger cool to touch and discolored.  Significant swelling noted.

## 2022-11-29 NOTE — Assessment & Plan Note (Addendum)
Was on eliquis, but does not take this at home. She states a physician told her to stop this. She was on it during her most recent hospitalization but has not had in 3 days -continue to hold for possible surgery  - MR abdomen in April 2024 that did show evidence of chronic portal vein thrombosis. This was when diagnosed -admitting physician discussed with vascular at [facility] this month who recommended continued eliquis unless fall risk or repeat imaging shows no more obstruction.  -f/u with PCP as she does not take at home

## 2022-11-29 NOTE — ED Provider Notes (Signed)
[facility] Provider Note    Event Date/Time   First MD Initiated Contact with Patient 11/29/22 [phone]     (approximate)   History   Post-op Problem   HPI  Suzanne Snyder is a 72 y.o. female with a history of type 1 diabetes, asthma, peripheral neuropathy, portal vein thrombosis, and chronic pain, status post left wrist I&D and debridement earlier this month who presents with worsening swelling and pain to the left hand.  The patient had the surgery performed 9 days ago.  She was discharged 3 days ago and states she is compliant with her antibiotics.  She states that since she was discharged the hand has gotten more and more swollen and painful.  She states that while she was still in the hospital there was some liquid draining from the wounds although this has subsided.  She denies any fever or chills but does report pain going up the arm.  I reviewed the past medical records.  The patient was admitted to the hospital service from 11/7 to 11/14 with a left volar wrist abscess and flexor tenosynovitis.  The patient had an I&D, carpal tunnel release, and flexor tendon irrigation and debridement on 11/8.  She was treated with vancomycin and cefepime in the hospital and switched to Bactrim for home.   Physical Exam   Triage Vital Signs: ED Triage Vitals  Encounter Vitals Group     BP 11/29/22 0554 (!) 126/48     Systolic BP Percentile --      Diastolic BP Percentile --      Pulse Rate 11/29/22 0554 66     Resp 11/29/22 0554 16     Temp 11/29/22 0554 97.8 F (36.6 C)     Temp src --      SpO2 11/29/22 0554 100 %     Weight 11/29/22 0549 115 lb (52.2 kg)     Height 11/29/22 0549 5\' 8"  (1.727 m)     Head Circumference --      Peak Flow --      Pain Score 11/29/22 0549 8     Pain Loc --      Pain Education --      Exclude from Growth Chart --     Most recent vital signs: Vitals:   11/29/22 0554 11/29/22 0637  BP: (!) 126/48 (!) 113/51  Pulse: 66 60   Resp: 16 16  Temp: 97.8 F (36.6 C)   SpO2: 100% 95%     General: Awake, no distress.  CV:  Good peripheral perfusion.  Resp:  Normal effort.  Abd:  No distention.  Other:  Left hand with significant swelling.  Mild erythema and induration.  Surgical wounds clean, dry, intact with no purulence or drainage.  Patient able to flex and extend fingers with limited ROM.  Normal cap refill distally.  Normal radial pulse.   ED Results / Procedures / Treatments   Labs (all labs ordered are listed, but only abnormal results are displayed) Labs Reviewed  COMPREHENSIVE METABOLIC PANEL - Abnormal; Notable for the following components:      Result Value   Sodium 132 (*)    CO2 20 (*)    Glucose, Bld 100 (*)    BUN 48 (*)    Creatinine, Ser 1.04 (*)    GFR, Estimated 57 (*)    All other components within normal limits  CBC WITH DIFFERENTIAL/PLATELET - Abnormal; Notable for the following components:   Monocytes Absolute  1.1 (*)    All other components within normal limits  LACTIC ACID, PLASMA  LACTIC ACID, PLASMA     EKG     RADIOLOGY  XR L hand: I independently viewed and interpreted the images; there is soft tissue swelling with no acute fracture.  PROCEDURES:  Critical Care performed: No  Procedures   MEDICATIONS ORDERED IN ED: Medications  morphine (PF) 4 MG/ML injection 4 mg (4 mg Intravenous Given 11/29/22 0639)     IMPRESSION / MDM / ASSESSMENT AND PLAN / ED COURSE  I reviewed the triage vital signs and the nursing notes.  72 year old female with PMH as noted above presents with worsening hand swelling and pain after an abscess I&D and flexor tenosynovitis debridement performed 9 days ago.  On exam the vital signs are normal.  There is no dehiscence of the wounds or purulent drainage, however the hand does appear significantly swollen.  There is no evidence of limb ischemia.  Differential diagnosis includes, but is not limited to, recurrent abscess, flexor  tenosynovitis, cellulitis.  We will obtain lab workup, x-ray, and consult orthopedics.  Patient's presentation is most consistent with acute presentation with potential threat to life or bodily function.   ----------------------------------------- 7:00 AM on 11/29/2022 -----------------------------------------  I consulted and discussed the case with Dr. Allena Katz from orthopedics.  He recommends obtaining an MRI to compare with the patient's preoperative imaging.  She may need transfer to a tertiary center.  I have signed her out to the oncoming ED provider.   FINAL CLINICAL IMPRESSION(S) / ED DIAGNOSES   Final diagnoses:  Swelling of left hand     Rx / DC Orders   ED Discharge Orders     None        Note:  This document was prepared using Dragon voice recognition software and may include unintentional dictation errors.    Dionne Bucy, MD 11/29/22 [phone]

## 2022-11-29 NOTE — Progress Notes (Signed)
Orthopedic Tech Progress Note Patient Details:  Suzanne Snyder [birth year] [id number]  Padded volar splint applied to LUE followed by placement in the arm elevator sling.   Ortho Devices Type of Ortho Device: Sling arm elevator, Volar splint Ortho Device/Splint Location: LUE Ortho Device/Splint Interventions: Ordered, Application, Adjustment   Post Interventions Patient Tolerated: Fair Instructions Provided: Care of device  Devone Bonilla Carmine Savoy 11/29/2022, 6:25 PM

## 2022-11-29 NOTE — Progress Notes (Signed)
PHARMACY -  BRIEF ANTIBIOTIC NOTE   Pharmacy has received consult(s) for vancomycin and cefepime from an ED provider.  The patient's profile has been reviewed for ht/wt/allergies/indication/available labs.    One time order(s) placed by MD for vancomycin and cefepime  Further antibiotics/pharmacy consults should be ordered by admitting physician if indicated.                       Bari Mantis PharmD Clinical Pharmacist 11/29/2022

## 2022-11-29 NOTE — Assessment & Plan Note (Signed)
Continue requip at night

## 2022-11-29 NOTE — Assessment & Plan Note (Addendum)
72 year old who is s/p I&D on 11/20/2022 by Dr. Audelia Acton at [facility] of left wrist in setting of cellulitis with wrist abscess and flexor tenosynovitis who presented back to ED at [facility] today for worsening pain and swelling of her left wrist. Imaging concerning for tenosynovitis, effusions and possible septic arthritis and myositis. She was transferred to [facility] for further care by hand surgeon  -obs to med-surg -ortho consulted, Dr. Dion Saucier -continue broad spectrum abx -check inflammatory markers -no SIRS criteria -pain control  -SCDs, anticipate surgery possibly tomorrow . Last dose of eliquis 11/15 - S/p I&D of left volar wrist abscess, left carpal tunnel release, left hand and flexor tendon irrigation and debridement on 11/20/2022. -No growth on surgical cultures to date - was d/c on bactrim, but only home for a few hours before returning to ED.

## 2022-11-29 NOTE — Consult Note (Signed)
ORTHOPAEDIC CONSULTATION  REQUESTING PHYSICIAN: Orland Mustard, MD  Chief Complaint: left hand infection  HPI: Suzanne Snyder is a 72 y.o. female transferred from [facility] for tertiary level hand care with medical history significant of T1DM on insulin pump, asthma, tobacco abuse, chronic pain, portal vein occlusion on eliquis, anxiety, RLS who presented to ED due to worsening pain in her left arm. She is s/p I&D on 11/20/2022 by Dr. Audelia Acton at [facility] of left wrist in setting of cellulitis with wrist abscess and flexor tenosynovitis who presented back to ED at [facility] today for worsening pain and swelling of her left wrist. She was discharged on 11/15 and went home. She states pain and were swelling were getting worse before discharge so unsure why sent home. She came to ED due to pain/worsening swelling/redness. No fever/chills at home. She is right handed. Last dose of eliquis 11/15 on d/c from [facility].   Pain is rated moderate to severe, with significant loss of function.    Past Medical History:  Diagnosis Date   Anxiety    Arthritis    Asthma    Diabetes mellitus without complication (HCC)    Insulin pump in place    Osteoporosis    RLS (restless legs syndrome)    Smokers' cough (HCC)    Wears dentures    full upper and lower   Past Surgical History:  Procedure Laterality Date   CARPAL TUNNEL RELEASE Left 11/20/2022   Procedure: CARPAL TUNNEL RELEASE;  Surgeon: Reinaldo Berber, MD;  Location: [facility] ORS;  Service: Orthopedics;  Laterality: Left;   CATARACT EXTRACTION W/PHACO Right 02/20/2020   Procedure: CATARACT EXTRACTION PHACO AND INTRAOCULAR LENS PLACEMENT (IOC) RIGHT 5.60 00:36.3;  Surgeon: Galen Manila, MD;  Location: [facility];  Service: Ophthalmology;  Laterality: Right;  Diabetic - insulin pump Requests not first   CATARACT EXTRACTION W/PHACO Left 03/05/2020   Procedure: CATARACT EXTRACTION PHACO AND INTRAOCULAR LENS PLACEMENT (IOC) LEFT 4.94 00:31.6;  Surgeon: Galen Manila, MD;  Location: [facility];  Service: Ophthalmology;  Laterality: Left;   HIP PINNING,CANNULATED Right 02/09/2022   Procedure: PERCUTANEOUS FIXATION OF FEMORAL NECK;  Surgeon: Deeann Saint, MD;  Location: [facility] ORS;  Service: Orthopedics;  Laterality: Right;   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   ROTATOR CUFF REPAIR Left 2014 and 2015   SPINE SURGERY N/A 1984   L4-5, not sure of procedure   THYROID SURGERY     Social History   Socioeconomic History   Marital status: Married    Spouse name: Not on file   Number of children: Not on file   Years of education: Not on file   Highest education level: Not on file  Occupational History   Not on file  Tobacco Use   Smoking status: Every Day    Current packs/day: 1.00    Average packs/day: 1 pack/day for 50.0 years (50.0 ttl pk-yrs)    Types: Cigarettes   Smokeless tobacco: Never   Tobacco comments:    started smoking age 47  Vaping Use   Vaping status: Former   Devices: "tried itChiropractor and Sexual Activity   Alcohol use: No   Drug use: No   Sexual activity: Not on file  Other Topics Concern   Not on file  Social History Narrative   Not on file   Social Determinants of Health   Financial Resource Strain: Low Risk  (07/01/2021)   Received from [facility], [facility]   Overall Financial Resource Strain (CARDIA)    Difficulty of Paying Living Expenses: Not hard at all  Food Insecurity: No Food Insecurity (11/29/2022)   Hunger Vital Sign    Worried About Running Out of Food in the Last Year: Never true    Ran Out of Food in the Last Year: Never true  Transportation Needs: No Transportation Needs (11/29/2022)   PRAPARE - Administrator, Civil Service (Medical): No    Lack of Transportation (Non-Medical): No  Physical Activity: Inactive (07/01/2021)   Received from [facility], [facility]   Exercise Vital Sign     Days of Exercise per Week: 0 days    Minutes of Exercise per Session: 0 min  Stress: Stress Concern Present (07/01/2021)   Received from [facility], [facility]   [employer] of Occupational Health - Occupational Stress Questionnaire    Feeling of Stress : Very much  Social Connections: Socially Isolated (07/01/2021)   Received from [facility], [facility]   Social Connection and Isolation Panel [NHANES]    Frequency of Communication with Friends and Family: Never    Frequency of Social Gatherings with Friends and Family: Never    Attends Religious Services: Never    Database administrator or Organizations: No    Attends Engineer, structural: Never    Marital Status: Married   Family History  Problem Relation Age of Onset   Parkinsonism Father    Allergies  Allergen Reactions   Cat Hair Extract Shortness Of Breath   Pregabalin Anaphylaxis   Erythromycin Other (See Comments)    Severe stomach upset     Positive ROS: All other systems have been reviewed and were otherwise negative with the exception of those mentioned in the HPI and as above.  Physical Exam: BP (!) 105/54   Pulse (!) 58   Ht 5\' 8"  (1.727 m)   Wt 52.2 kg Comment: stated-bed not working  SpO2 100%   BMI 17.49 kg/m   General: Alert, no acute distress Cardiovascular: Good capillary refill in the hand Respiratory: No cyanosis, no use of accessory musculature GI: No organomegaly, abdomen is soft and non-tender Skin: Diffuse erythema from distal forearm to fingertips Neurologic: Sensation intact distally in all fingers Psychiatric: Patient is competent for consent with normal mood and affect Lymphatic: No axillary or cervical lymphadenopathy  MUSCULOSKELETAL: all fingers flex, extend and abduct, but with diffuse pain everywhere      MRI with moderate radioulnar joint effusion with debris, extensive soft tissue swelling  and tenosynovitis, without clear fluid accumulation.  Assessment: Principal Problem:   Suppurative tenosynovitis of flexor tendon of left hand Active Problems:   Tobacco abuse   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)   Portal vein obstruction   Generalized anxiety disorder   RLS (restless legs syndrome)   Plan: Plan for splint application, mission sling elevation, IV antibiotics.  I'm not convinced she will need more surgery at this point, but will involve Dr. Yehuda Budd who is our subspecialty hand surgeon this weekend, npo after midnight tonight.     Eulas Post, MD Cell [phone]   11/29/2022 4:35 PM

## 2022-11-29 NOTE — Assessment & Plan Note (Signed)
Continue hydroxyzine BID

## 2022-11-29 NOTE — Assessment & Plan Note (Addendum)
Recent A1C of 7.3 She is currently on insulin pump with novolog and long acting Pump was stopped at [facility] due to hypoglycemia. Her pump runs out tonight so will disconnect now and discussed with nurse Will start her on 2 units novolog pre meal with sensitive SSI Basal insulin of lantus at 5 units since likely NPO tonight (was on 12 at [facility])  Accuchecks QAC/HS

## 2022-11-29 NOTE — ED Provider Notes (Signed)
Care of this patient assumed from prior physician at 0700 pending MRI and disposition. Please see prior physician note for further details.  Briefly this is a 72 year old female with recent history of wrist I&D and debridement in the setting of cellulitis with wrist abscess and flexor tenosynovitis who presented back to the emergency department today with worsening pain and swelling.  During her prior admission, initial plan had been for transfer to [facility] for hand surgery evaluation, but no beds were available, so she was taken to the OR by Dr. Audelia Acton with our orthopedics team. Today, the case was discussed with Dr. Allena Katz with orthopedics who recommended repeat MRI for further evaluation.  Signed out to me pending MRI and disposition.  Repeat MRI demonstrated a radial ulnar joint effusion concerning for possible septic arthritis as well as persistent moderate flexor tenosynovitis, mildly improved from prior.  Multiple additional findings noted by radiology.  The case was reviewed with Dr. Allena Katz.  He did feel that transfer to [facility] for hand surgery evaluation at this time was appropriate.  Patient updated on this plan and is comfortable.  Was somnolent on evaluation, so blood Kos was checked which was without evidence of hypoglycemia.  Photos of hand below.  Will reach out to [facility] for transfer.  Case reviewed with Dr. Dion Saucier with Ortho hand.  He did agree that patient was appropriate for transfer, but felt that the patient would be most appropriately admitted to the medicine service.  He did recommend restarting IV antibiotics.  Cefepime, vancomycin, and Flagyl ordered.  Does not meet sepsis criteria.  [facility] will contact hospitalist team at [facility].  Reviewed with Dr. Artis Flock who accepted the patient in transfer.   CRITICAL CARE Performed by: Trinna Post   Total critical care time: 30 minutes  Critical care time was exclusive of separately billable procedures and treating other  patients.  Critical care was necessary to treat or prevent imminent or life-threatening deterioration.  Critical care was time spent personally by me on the following activities: development of treatment plan with patient and/or surrogate as well as nursing, discussions with consultants, evaluation of patient's response to treatment, examination of patient, obtaining history from patient or surrogate, ordering and performing treatments and interventions, ordering and review of laboratory studies, ordering and review of radiographic studies, pulse oximetry and re-evaluation of patient's condition.         Trinna Post, MD 11/29/22 [phone]

## 2022-11-29 NOTE — ED Notes (Signed)
Pt walked to toilet to void. Pt resting in bed. Cefepime was paused and restarted because could not find IV pole. Pt now requesting more pain medicine. EDP informed.

## 2022-11-29 NOTE — Assessment & Plan Note (Signed)
Nicotine patch Encouraged cessation  

## 2022-11-29 NOTE — ED Notes (Signed)
EMTALA reviewed by this RN.  

## 2022-11-29 NOTE — Progress Notes (Addendum)
Pharmacy Antibiotic Note  Suzanne Snyder is a 72 y.o. female admitted on 11/29/2022 with  cellulitis with wrist abscess and flexor tenosynovitis  .  Pharmacy has been consulted for vancomycin and cefepime dosing.  Plan: Vancomycin 750 mg IV q24h, eAUC 434.4 with Scr 1.08, Vdcoe 0.72  > Goal AUC 400-550, levels as indicated Cefepime 2 g IV q12h Monitor renal function, clinical response, cultures, and de-escalation  Height: 5\' 8"  (172.7 cm) Weight: 52.2 kg (115 lb) (stated-bed not working) IBW/kg (Calculated) : 63.9  Temp (24hrs), Avg:97.9 F (36.6 C), Min:97.8 F (36.6 C), Max:98.2 F (36.8 C)  Recent Labs  Lab 11/23/22 0409 11/25/22 0549 11/29/22 0623  WBC 5.9 5.1 8.2  CREATININE 0.41* 0.72 1.04*  LATICACIDVEN  --   --  1.2    Estimated Creatinine Clearance: 40.9 mL/min (A) (by C-G formula based on SCr of 1.04 mg/dL (H)).    Allergies  Allergen Reactions   Cat Hair Extract Shortness Of Breath   Pregabalin Anaphylaxis   Erythromycin Other (See Comments)    Severe stomach upset    Antimicrobials this admission: Cefepime 11/17 >>  Vancomycin 11/17 >> Metronidazole 11/17 >> 11/17  Microbiology results: None  Thank you for allowing pharmacy to be a part of this patient's care.  Louie Casa Yun Gutierrez 11/29/2022 2:20 PM

## 2022-11-29 NOTE — H&P (Signed)
History and Physical    Patient: Suzanne Snyder VZD:[id number] DOB: 1950/02/24 DOA: 11/29/2022 DOS: the patient was seen and examined on 11/29/2022 PCP: Gracelyn Nurse, MD  Patient coming from:  [facility]  - lives alone. Ambulates independently.    Chief Complaint: left hand swelling and pain   HPI: Suzanne Snyder is a 72 y.o. female with medical history significant of T1DM on insulin pump, asthma, tobacco abuse, chronic pain, portal vein occlusion on eliquis, anxiety, RLS who presented to ED due to worsening pain in her left arm. She is s/p I&D on 11/20/2022 by Dr. Audelia Acton at [facility] of left wrist in setting of cellulitis with wrist abscess and flexor tenosynovitis who presented back to ED at [facility] today for worsening pain and swelling of her left wrist. She was discharged on 11/15 and went home. She states pain and were swelling were getting worse before discharge so unsure why sent home. She came to ED due to pain/worsening swelling/redness. No fever/chills at home. She is right handed. Last dose of eliquis 11/15 on d/c from [facility].   Denies any fever/chills, vision changes/headaches, chest pain or palpitations, shortness of breath or cough, abdominal pain, N/V/D, dysuria or leg swelling.   She smokes (1PPD or more/day) and does not drink alcohol.   ER Course:  vitals: afebrile, bp :102/79, HR; 60, RR: 16, oxygen: 100%RA Pertinent labs: BUN: 48, sodium: 132,  Left hand xray: Pronounced soft tissue swelling to the dorsal hand. No soft tissue emphysema or underlying acute bony abnormality. MRI left hand/wrist: Moderate sized distal radioulnar joint effusion with internal complexity and debris. Small effusions of the radiocarpal and midcarpal joints. Small second MCP joint effusion. Findings are nonspecific and may be reactive or represent septic arthritis. 2. Moderate flexor tenosynovitis of the wrist and hand, mildly improved compared to the prior MRI. 3. Mild extensor carpi ulnaris  tenosynovitis. 4. Extensive subcutaneous edema of the hand, more pronounced dorsally. No drainable fluid collections identified on noncontrast sequences. 5. Generalized intramuscular edema of the hand and distal forearm musculature compatible with myositis. No organized intramuscular fluid collection. 6. Possible nondisplaced fracture of the volar aspect of the capitate. In ED: started on vanc, cefepime and flagyl. Ortho consulted who agreed to transfer to [facility] with [facility] admitting.    Review of Systems: As mentioned in the history of present illness. All other systems reviewed and are negative. Past Medical History:  Diagnosis Date   Anxiety    Arthritis    Asthma    Diabetes mellitus without complication (HCC)    Insulin pump in place    Osteoporosis    RLS (restless legs syndrome)    Smokers' cough (HCC)    Wears dentures    full upper and lower   Past Surgical History:  Procedure Laterality Date   CARPAL TUNNEL RELEASE Left 11/20/2022   Procedure: CARPAL TUNNEL RELEASE;  Surgeon: Reinaldo Berber, MD;  Location: [facility] ORS;  Service: Orthopedics;  Laterality: Left;   CATARACT EXTRACTION W/PHACO Right 02/20/2020   Procedure: CATARACT EXTRACTION PHACO AND INTRAOCULAR LENS PLACEMENT (IOC) RIGHT 5.60 00:36.3;  Surgeon: Galen Manila, MD;  Location: [facility];  Service: Ophthalmology;  Laterality: Right;  Diabetic - insulin pump Requests not first   CATARACT EXTRACTION W/PHACO Left 03/05/2020   Procedure: CATARACT EXTRACTION PHACO AND INTRAOCULAR LENS PLACEMENT (IOC) LEFT 4.94 00:31.6;  Surgeon: Galen Manila, MD;  Location: [facility];  Service: Ophthalmology;  Laterality: Left;   HIP PINNING,CANNULATED Right 02/09/2022   Procedure:  PERCUTANEOUS FIXATION OF FEMORAL NECK;  Surgeon: Deeann Saint, MD;  Location: [facility] ORS;  Service: Orthopedics;  Laterality: Right;   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   ROTATOR CUFF REPAIR Left 2014 and 2015   SPINE SURGERY  N/A 1984   L4-5, not sure of procedure   THYROID SURGERY     Social History:  reports that she has been smoking cigarettes. She has a 50 pack-year smoking history. She has never used smokeless tobacco. She reports that she does not drink alcohol and does not use drugs.  Allergies  Allergen Reactions   Cat Hair Extract Shortness Of Breath   Pregabalin Anaphylaxis   Erythromycin Other (See Comments)    Severe stomach upset    Family History  Problem Relation Age of Onset   Parkinsonism Father     Prior to Admission medications   Medication Sig Start Date End Date Taking? Authorizing Provider  acetaminophen (TYLENOL) 325 MG tablet Take 2 tablets (650 mg total) by mouth every 6 (six) hours as needed for mild pain, moderate pain, fever or headache (or Fever >/= 101). 02/17/22   Gillis Santa, MD  albuterol (PROVENTIL) (2.5 MG/3ML) 0.083% nebulizer solution Take 2.5 mg by nebulization every 4 (four) hours as needed for wheezing or shortness of breath.    [provider]  albuterol (VENTOLIN HFA) 108 (90 Base) MCG/ACT inhaler Inhale into the lungs every 6 (six) hours as needed for wheezing or shortness of breath.    [provider]  apixaban (ELIQUIS) 5 MG TABS tablet Take 1 tablet (5 mg total) by mouth 2 (two) times daily. 05/15/22   Loyce Dys, MD  bisacodyl (DULCOLAX) 10 MG suppository Place 1 suppository (10 mg total) rectally daily as needed for severe constipation. 02/17/22   Gillis Santa, MD  bisacodyl (DULCOLAX) 5 MG EC tablet Take 2 tablets (10 mg total) by mouth daily as needed for moderate constipation. 02/17/22   Gillis Santa, MD  calcium carbonate (TUMS - DOSED IN MG ELEMENTAL CALCIUM) 500 MG chewable tablet Chew 1 tablet (200 mg of elemental calcium total) by mouth 3 (three) times daily with meals for 4 days. 11/26/22 11/30/22  Enedina Finner, MD  celecoxib (CELEBREX) 100 MG capsule Take 100 mg by mouth at bedtime. 05/15/22   [provider]  clobetasol  (TEMOVATE) 0.05 % external solution Apply topically to itchy areas of scalp twice daily as needed for flares. Avoid applying to face, groin, and axilla. Use as directed. 10/21/22   Elie Goody, MD  clobetasol cream (TEMOVATE) 0.05 % Apply topically to arms and hands twice daily as needed for eczema flares. Avoid applying to face, groin, and axilla. Use as directed. 10/21/22   Elie Goody, MD  clonazePAM (KLONOPIN) 0.5 MG tablet Take 1 tablet (0.5 mg total) by mouth 2 (two) times daily as needed for anxiety. 02/17/22   Gillis Santa, MD  cyclobenzaprine (FLEXERIL) 10 MG tablet Take 10 mg by mouth 3 (three) times daily as needed for muscle spasms.    [provider]  denosumab (PROLIA) 60 MG/ML SOSY injection Inject 60 mg into the skin every 6 (six) months.    [provider]  furosemide (LASIX) 20 MG tablet Take 20 mg by mouth daily as needed for edema or fluid. 12/31/21   [provider]  gabapentin (NEURONTIN) 300 MG capsule Take 900 mg by mouth 2 (two) times daily.    [provider]  hydrocortisone 2.5 % cream Apply to itchy rash  around eyes twice daily until clear. 10/21/22   Elie Goody, MD  hydrOXYzine (ATARAX) 25 MG tablet Take 50 mg by mouth 2 (two) times daily. 03/17/22   [provider]  insulin aspart (NOVOLOG) 100 UNIT/ML injection Inject 40 Units into the skin daily. AS INSTRUCTED VIA OMNIPOD INSULIN PUMP 10/13/22   [provider]  ipratropium (ATROVENT) 0.02 % nebulizer solution Take 0.5 mg by nebulization every 6 (six) hours as needed for wheezing or shortness of breath.    [provider]  iron polysaccharides (NIFEREX) 150 MG capsule Take 1 capsule (150 mg total) by mouth daily. 02/17/22 08/25/22  Gillis Santa, MD  Multiple Vitamin (MULTIVITAMIN ADULT PO) Take by mouth 2 (two) times daily.    [provider]  mupirocin ointment (BACTROBAN) 2 % Apply 1 Application topically daily as needed.     [provider]  Omega-3 Fatty Acids (FISH OIL) 1000 MG CAPS Take 2 capsules by mouth daily. 08/04/19   [provider]  oxyCODONE (OXY IR/ROXICODONE) 5 MG immediate release tablet Take 1 tablet (5 mg total) by mouth every 6 (six) hours as needed for moderate pain (pain score 4-6). 11/26/22   Enedina Finner, MD  pantoprazole (PROTONIX) 40 MG tablet Take 1 tablet (40 mg total) by mouth daily. 05/06/22 08/25/22  Pilar Jarvis, MD  polyethylene glycol (MIRALAX) 17 g packet Take 17 g by mouth daily. 02/11/22   Wouk, Wilfred Curtis, MD  polyvinyl alcohol (LIQUIFILM TEARS) 1.4 % ophthalmic solution Place 1 drop into both eyes as needed for dry eyes. 02/17/22   Gillis Santa, MD  rOPINIRole (REQUIP) 1 MG tablet Take 1.5 mg by mouth at bedtime.    [provider]  sulfamethoxazole-trimethoprim (BACTRIM DS) 800-160 MG tablet Take 1 tablet by mouth every 12 (twelve) hours for 12 doses. 11/26/22 12/02/22  Enedina Finner, MD  terbinafine (LAMISIL) 1 % cream Apply topically to feet twice daily for tinea pedis 10/21/22   Elie Goody, MD  Vitamin D, Ergocalciferol, (DRISDOL) 50000 UNITS CAPS capsule Take 150,000 Units by mouth 3 (three) times a week.    [provider]  Monte Fantasia INHUB 500-50 MCG/ACT AEPB Inhale 1 puff into the lungs in the morning and at bedtime. 09/03/21   [provider]    Physical Exam: Vitals:   11/29/22 1346 11/29/22 1404  SpO2: 96%   Weight:  52.2 kg  Height:  5\' 8"  (1.727 m)   General:  Appears calm and comfortable and is in NAD Eyes:  PERRL, EOMI, normal lids, iris ENT:  grossly normal hearing, lips & tongue, mmm; dentures  Neck:  no LAD, masses or thyromegaly; no carotid bruits Cardiovascular:  RRR, no m/r/g. No LE edema.  Respiratory:   CTA bilaterally with no wheezes/rales/rhonchi.  Normal respiratory effort. Abdomen:  soft, NT, ND, NABS Back:   normal alignment, no CVAT Skin:  no rash or induration seen on limited exam Musculoskeletal:   LUE: erythema, edema from above wrist into hand. TTP over this area. Can move hands. Radial pulse intact. Very limited ROM.      Lower extremity:  No LE edema.  Limited foot exam with no ulcerations.  2+ distal pulses. Psychiatric:  grossly normal mood and affect, speech fluent and appropriate, AOx3 Neurologic:  CN 2-12 grossly intact, moves all extremities in coordinated fashion, sensation intact   Radiological Exams on Admission: Independently reviewed - see discussion in A/P where applicable  MR WRIST LEFT WO CONTRAST  Result Date: 11/29/2022 CLINICAL DATA:  Septic  arthritis suspected, hand, xray done. Increased swelling after surgical debridement 2 days ago EXAM: MRI OF THE LEFT HAND WITHOUT CONTRAST; MRI OF THE LEFT WRIST WITHOUT CONTRAST TECHNIQUE: Multiplanar, multisequence MR imaging of the left hand and left wrist was performed. No intravenous contrast was administered. COMPARISON:  X-ray 11/29/2022, MRI 11/19/2022 FINDINGS: Bones/Joint/Cartilage Possible nondisplaced fracture of the volar aspect of the capitate (series 4, image 30-31). No additional sites of fracture are identified. No malalignment. Similar degenerative changes of the hand and wrist, most notably at the first [facility] joint. Moderate sized distal radioulnar joint effusion with internal complexity and debris. Small effusions of the radiocarpal and midcarpal joints. Small second MCP joint effusion. No definite erosion. Mild patchy bone marrow edema throughout the carpus. No confluent low T1 marrow signal. Ligaments No acute ligamentous injury. Muscles and Tendons Generalized intramuscular edema of the hand and distal forearm musculature. No organized intramuscular fluid collection. Postsurgical changes from carpal tunnel release. Moderate flexor tenosynovitis of the wrist and hand, mildly improved compared to the prior MRI. Mild extensor carpi ulnaris tenosynovitis. Otherwise, no significant extensor tenosynovitis. Soft tissues  Extensive subcutaneous edema of the hand, more pronounced dorsally. No drainable fluid collections identified on noncontrast sequences. IMPRESSION: 1. Moderate sized distal radioulnar joint effusion with internal complexity and debris. Small effusions of the radiocarpal and midcarpal joints. Small second MCP joint effusion. Findings are nonspecific and may be reactive or represent septic arthritis. 2. Moderate flexor tenosynovitis of the wrist and hand, mildly improved compared to the prior MRI. 3. Mild extensor carpi ulnaris tenosynovitis. 4. Extensive subcutaneous edema of the hand, more pronounced dorsally. No drainable fluid collections identified on noncontrast sequences. 5. Generalized intramuscular edema of the hand and distal forearm musculature compatible with myositis. No organized intramuscular fluid collection. 6. Possible nondisplaced fracture of the volar aspect of the capitate. Electronically Signed   By: Duanne Guess D.O.   On: 11/29/2022 09:26   MR HAND LEFT WO CONTRAST  Result Date: 11/29/2022 CLINICAL DATA:  Septic arthritis suspected, hand, xray done. Increased swelling after surgical debridement 2 days ago EXAM: MRI OF THE LEFT HAND WITHOUT CONTRAST; MRI OF THE LEFT WRIST WITHOUT CONTRAST TECHNIQUE: Multiplanar, multisequence MR imaging of the left hand and left wrist was performed. No intravenous contrast was administered. COMPARISON:  X-ray 11/29/2022, MRI 11/19/2022 FINDINGS: Bones/Joint/Cartilage Possible nondisplaced fracture of the volar aspect of the capitate (series 4, image 30-31). No additional sites of fracture are identified. No malalignment. Similar degenerative changes of the hand and wrist, most notably at the first [facility] joint. Moderate sized distal radioulnar joint effusion with internal complexity and debris. Small effusions of the radiocarpal and midcarpal joints. Small second MCP joint effusion. No definite erosion. Mild patchy bone marrow edema throughout the carpus.  No confluent low T1 marrow signal. Ligaments No acute ligamentous injury. Muscles and Tendons Generalized intramuscular edema of the hand and distal forearm musculature. No organized intramuscular fluid collection. Postsurgical changes from carpal tunnel release. Moderate flexor tenosynovitis of the wrist and hand, mildly improved compared to the prior MRI. Mild extensor carpi ulnaris tenosynovitis. Otherwise, no significant extensor tenosynovitis. Soft tissues Extensive subcutaneous edema of the hand, more pronounced dorsally. No drainable fluid collections identified on noncontrast sequences. IMPRESSION: 1. Moderate sized distal radioulnar joint effusion with internal complexity and debris. Small effusions of the radiocarpal and midcarpal joints. Small second MCP joint effusion. Findings are nonspecific and may be reactive or represent septic arthritis. 2. Moderate flexor tenosynovitis of the wrist and hand, mildly improved  compared to the prior MRI. 3. Mild extensor carpi ulnaris tenosynovitis. 4. Extensive subcutaneous edema of the hand, more pronounced dorsally. No drainable fluid collections identified on noncontrast sequences. 5. Generalized intramuscular edema of the hand and distal forearm musculature compatible with myositis. No organized intramuscular fluid collection. 6. Possible nondisplaced fracture of the volar aspect of the capitate. Electronically Signed   By: Duanne Guess D.O.   On: 11/29/2022 09:26   DG Hand Complete Left  Result Date: 11/29/2022 CLINICAL DATA:  Surgical debridement with discharge 2 days ago. Swelling. EXAM: LEFT HAND - COMPLETE 3+ VIEW COMPARISON:  11/19/2022 FINDINGS: Superficial punctate debris which appears to be along the skin surface/bandage of the palm. Pronounced soft tissue swelling to the dorsal hand. No underlying erosion, fracture, or soft tissue emphysema. Chondrocalcinosis. IMPRESSION: Pronounced soft tissue swelling to the dorsal hand. No soft tissue  emphysema or underlying acute bony abnormality. Electronically Signed   By: Tiburcio Pea M.D.   On: 11/29/2022 06:36    Labs on Admission: I have personally reviewed the available labs and imaging studies at the time of the admission.  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 Audelia Acton at [facility] of left wrist in setting of cellulitis with wrist abscess and flexor tenosynovitis who presented back to ED at [facility] today for worsening pain and swelling of her left wrist. Imaging concerning for tenosynovitis, effusions and possible septic arthritis and myositis. She was transferred to [facility] for further care by hand surgeon  -obs to med-surg -ortho consulted, Dr. Dion Saucier -continue broad spectrum abx -check inflammatory markers -no SIRS criteria -pain control  -SCDs, anticipate surgery possibly tomorrow  - S/p I&D of left volar wrist abscess, left carpal tunnel release, left hand and flexor tendon irrigation and debridement on 11/20/2022. -No growth on surgical cultures to date - was d/c on bactrim, but only home for a few hours before returning to ED.    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 Her pump runs out tonight so will disconnect now and discussed with nurse Will start her on 2 units novolog pre meal with sensitive SSI Basal insulin of lantus at 5 units since likely NPO tonight (was on  12 at [facility])  Accuchecks QAC/HS   Portal vein obstruction Was on eliquis, but does not take this at home. She states a physician told her to stop this. She was on it during her most recent hospitalization but has not had in 3 days -continue to hold for possible surgery  - MR abdomen in April 2024 that did show evidence of chronic portal vein thrombosis. This was when diagnosed -admitting physician discussed with vascular at [facility] this month who recommended continued eliquis unless fall risk or repeat imaging shows no more obstruction.  -f/u with PCP as she does not take at home   Generalized anxiety disorder Continue hydroxyzine BID   RLS (  restless legs syndrome) Continue requip at night   Tobacco abuse Nicotine patch  Encouraged cessation      Advance Care Planning:   Code Status: Limited: Do not attempt resuscitation (DNR) -DNR-LIMITED -Do Not Intubate/DNI    Consults: ortho: Dr. Dion Saucier   DVT Prophylaxis: Ted/SCDs  Family Communication: none   Severity of Illness: The appropriate patient status for this patient is OBSERVATION. Observation status is judged to be reasonable and necessary in order to provide the required intensity of service to ensure the patient's safety. The patient's presenting symptoms, physical exam findings, and initial radiographic and laboratory data in the context of their medical condition is felt to place them at decreased risk for further clinical deterioration. Furthermore, it is anticipated that the patient will be medically stable for discharge from the hospital within 2 midnights of admission.   Author: Orland Mustard, MD 11/29/2022 2:42 PM  For on call review [email or web address].

## 2022-11-29 NOTE — ED Notes (Signed)
Patient signed Paper consent for transfer to another facility.

## 2022-11-29 NOTE — Progress Notes (Signed)
Plan of Care Note for accepted transfer   Patient: Suzanne Snyder MRN: [id number]   DOA: 11/29/2022  Facility requesting transfer: [facility] ED Requesting Provider: Dr. Rosalia Hammers  Reason for transfer: patient s/p I&D of wrist in setting of cellulitis with wrist abscess and flexor tenosynovitis  Facility course: admitted 10 days ago at [facility] and underwent I&D on 11/20/22 by Dr. Audelia Acton. Wanted to transfer to [facility], but no beds available. Came back in with worsening pain and swelling. MRI today showed Moderate sized distal radioulnar joint effusion with internal complexity and debris. Small effusions of the radiocarpal and midcarpal joints. Small second MCP joint effusion. Findings are nonspecific and may be reactive or represent septic arthritis. 2. Moderate flexor tenosynovitis of the wrist and hand, mildly improved compared to the prior MRI. 3. Mild extensor carpi ulnaris tenosynovitis. 4. Extensive subcutaneous edema of the hand, more pronounced dorsally. No drainable fluid collections identified on noncontrast sequences. 5. Generalized intramuscular edema of the hand and distal forearm musculature compatible with myositis. No organized intramuscular fluid collection. 6. Possible nondisplaced fracture of the volar aspect of the capitate.  She has no sepsis criteria. Lactic and wbc normal.   EDP discussed with on call ortho (Dr Dion Saucier) who accepted patient to [facility] with [facility] admitting. Discussed with Dr. Dion Saucier, okay for diet today. Carb modified diet placed with SSI. Asked for med rec to be done to figure out if on eliquis. Will hold if on this in anticipation of surgery tomorrow. Hold SSI if insulin pump in place. Note sent to pharmacy.   Plan of care: The patient is accepted for admission to Med-surg  unit, at [facility]..    Author: Orland Mustard, MD 11/29/2022  Check [email or web address] for on-call coverage.  Nursing staff, Please call [facility] Admits & Consults System-Wide number on Amion as  soon as patient's arrival, so appropriate admitting provider can evaluate the pt.

## 2022-11-30 DIAGNOSIS — Z7901 Long term (current) use of anticoagulants: Secondary | ICD-10-CM | POA: Diagnosis not present

## 2022-11-30 DIAGNOSIS — Z9641 Presence of insulin pump (external) (internal): Secondary | ICD-10-CM | POA: Diagnosis present

## 2022-11-30 DIAGNOSIS — Z9842 Cataract extraction status, left eye: Secondary | ICD-10-CM | POA: Diagnosis not present

## 2022-11-30 DIAGNOSIS — E1043 Type 1 diabetes mellitus with diabetic autonomic (poly)neuropathy: Secondary | ICD-10-CM | POA: Diagnosis present

## 2022-11-30 DIAGNOSIS — Z79899 Other long term (current) drug therapy: Secondary | ICD-10-CM | POA: Diagnosis not present

## 2022-11-30 DIAGNOSIS — J45909 Unspecified asthma, uncomplicated: Secondary | ICD-10-CM | POA: Diagnosis present

## 2022-11-30 DIAGNOSIS — I871 Compression of vein: Secondary | ICD-10-CM | POA: Diagnosis present

## 2022-11-30 DIAGNOSIS — J410 Simple chronic bronchitis: Secondary | ICD-10-CM | POA: Diagnosis present

## 2022-11-30 DIAGNOSIS — D72819 Decreased white blood cell count, unspecified: Secondary | ICD-10-CM | POA: Diagnosis present

## 2022-11-30 DIAGNOSIS — Z66 Do not resuscitate: Secondary | ICD-10-CM | POA: Diagnosis present

## 2022-11-30 DIAGNOSIS — I81 Portal vein thrombosis: Secondary | ICD-10-CM | POA: Diagnosis present

## 2022-11-30 DIAGNOSIS — Z961 Presence of intraocular lens: Secondary | ICD-10-CM | POA: Diagnosis present

## 2022-11-30 DIAGNOSIS — Z9889 Other specified postprocedural states: Secondary | ICD-10-CM | POA: Diagnosis not present

## 2022-11-30 DIAGNOSIS — F17210 Nicotine dependence, cigarettes, uncomplicated: Secondary | ICD-10-CM | POA: Diagnosis present

## 2022-11-30 DIAGNOSIS — F411 Generalized anxiety disorder: Secondary | ICD-10-CM | POA: Diagnosis present

## 2022-11-30 DIAGNOSIS — G8929 Other chronic pain: Secondary | ICD-10-CM | POA: Diagnosis present

## 2022-11-30 DIAGNOSIS — Z96642 Presence of left artificial hip joint: Secondary | ICD-10-CM | POA: Diagnosis present

## 2022-11-30 DIAGNOSIS — Z794 Long term (current) use of insulin: Secondary | ICD-10-CM | POA: Diagnosis not present

## 2022-11-30 DIAGNOSIS — E872 Acidosis, unspecified: Secondary | ICD-10-CM | POA: Diagnosis not present

## 2022-11-30 DIAGNOSIS — M810 Age-related osteoporosis without current pathological fracture: Secondary | ICD-10-CM | POA: Diagnosis present

## 2022-11-30 DIAGNOSIS — M65142 Other infective (teno)synovitis, left hand: Secondary | ICD-10-CM | POA: Diagnosis present

## 2022-11-30 DIAGNOSIS — G2581 Restless legs syndrome: Secondary | ICD-10-CM | POA: Diagnosis present

## 2022-11-30 LAB — BASIC METABOLIC PANEL
Anion gap: 10 (ref 5–15)
BUN: 36 mg/dL — ABNORMAL HIGH (ref 8–23)
CO2: 14 mmol/L — ABNORMAL LOW (ref 22–32)
Calcium: 8 mg/dL — ABNORMAL LOW (ref 8.9–10.3)
Chloride: 106 mmol/L (ref 98–111)
Creatinine, Ser: 1.07 mg/dL — ABNORMAL HIGH (ref 0.44–1.00)
GFR, Estimated: 56 mL/min — ABNORMAL LOW (ref >60)
Glucose, Bld: 206 mg/dL — ABNORMAL HIGH (ref 70–99)
Potassium: 4.5 mmol/L (ref 3.5–5.1)
Sodium: 130 mmol/L — ABNORMAL LOW (ref 135–145)

## 2022-11-30 LAB — GLUCOSE, CAPILLARY
Glucose-Capillary: 141 mg/dL — ABNORMAL HIGH (ref 70–99)
Glucose-Capillary: 171 mg/dL — ABNORMAL HIGH (ref 70–99)
Glucose-Capillary: 182 mg/dL — ABNORMAL HIGH (ref 70–99)
Glucose-Capillary: 198 mg/dL — ABNORMAL HIGH (ref 70–99)
Glucose-Capillary: 232 mg/dL — ABNORMAL HIGH (ref 70–99)
Glucose-Capillary: 242 mg/dL — ABNORMAL HIGH (ref 70–99)
Glucose-Capillary: 365 mg/dL — ABNORMAL HIGH (ref 70–99)

## 2022-11-30 LAB — CBC
HCT: 36.5 % (ref 36.0–46.0)
Hemoglobin: 12 g/dL (ref 12.0–15.0)
MCH: 28.8 pg (ref 26.0–34.0)
MCHC: 32.9 g/dL (ref 30.0–36.0)
MCV: 87.5 fL (ref 80.0–100.0)
Platelets: 279 10*3/uL (ref 150–400)
RBC: 4.17 MIL/uL (ref 3.87–5.11)
RDW: 15 % (ref 11.5–15.5)
WBC: 3.5 10*3/uL — ABNORMAL LOW (ref 4.0–10.5)
nRBC: 0 % (ref 0.0–0.2)

## 2022-11-30 MED ORDER — PHENOL 1.4 % MT LIQD
1.0000 | OROMUCOSAL | Status: DC | PRN
  Administered 2022-12-01: 1 via OROMUCOSAL
  Filled 2022-11-30: qty 177

## 2022-11-30 MED ORDER — INSULIN ASPART 100 UNIT/ML IJ SOLN
4.0000 [IU] | Freq: Once | INTRAMUSCULAR | Status: AC
  Administered 2022-11-30: 4 [IU] via SUBCUTANEOUS

## 2022-11-30 MED ORDER — INSULIN ASPART 100 UNIT/ML IJ SOLN
0.0000 [IU] | Freq: Three times a day (TID) | INTRAMUSCULAR | Status: DC
  Administered 2022-11-30 (×2): 3 [IU] via SUBCUTANEOUS
  Administered 2022-12-01: 2 [IU] via SUBCUTANEOUS
  Administered 2022-12-01: 8 [IU] via SUBCUTANEOUS
  Administered 2022-12-01: 3 [IU] via SUBCUTANEOUS

## 2022-11-30 MED ORDER — INSULIN ASPART 100 UNIT/ML IJ SOLN: 0.0000 [IU] | Freq: Every day | INTRAMUSCULAR | Status: DC

## 2022-11-30 NOTE — Plan of Care (Signed)
  Problem: Clinical Measurements: Goal: Ability to maintain clinical measurements within normal limits will improve Outcome: Progressing   Problem: Activity: Goal: Risk for activity intolerance will decrease Outcome: Progressing   Problem: Pain Management: Goal: General experience of comfort will improve Outcome: Progressing
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
## 2022-11-30 NOTE — Plan of Care (Signed)
  Problem: Education: Goal: Knowledge of General Education information will improve Description Including pain rating scale, medication(s)/side effects and non-pharmacologic comfort measures Outcome: Progressing   

## 2022-11-30 NOTE — Consult Note (Signed)
Orthopedic Hand Surgery Consultation:  Reason for Consult: Left hand pain and swelling Referring Physician: Dr. Waymon Amato   HPI: Suzanne Snyder is a(an) 72 y.o. female who presented to [facility] emergency department on 11/29/2022 for left hand pain and swelling. She underwent left volar wrist abscess I&D, left carpal tunnel release, left hand flexor tendon I&D on 11/20/2022 at [facility]. She reports her symptoms improved after I&D but worsened after discharge on 11/26/2022. She has noticed pain. She was started on IV antibiotics over night and report symptoms have improved. She denies drainage from her wounds. She denies fevers. No known injury or cause of infection.    Physical Exam: Left Upper Extremity Incision over volar wrist, clean/dry/intact - sutures in place No drainage Mild swelling and mild erythema over the dorsal 2-3rd MCPs She is able to fully extend the digits She can make 70% of a composite fist No pain with micro motion of the digits of wrist Sensation intact to light touch Hand warm and well perfused   Assessment/Plan: - S/P I&D of the volar wrist/flexor tendons on 11/20/2022 with Dr. Audelia Acton at [facility] - Ongoing pain after discharge, readmitted  - No signs of ongoing infection in the wrist - Her pain is localized over the dorsal 2-3rd mcps with some swelling, low suspicion for septic joint as good finger range of motion with minimal pain - Encouraged finger range of motion and elevation - Continue abx per medicine - Ok for diet    [name] PA-C Orthopaedic Hand Surgery [facility] Office number: [phone] [address] [place], [place] [address]    Past Medical History:  Diagnosis Date   Anxiety    Arthritis    Asthma    Diabetes mellitus without complication (HCC)    Insulin pump in place    Osteoporosis    RLS (restless legs syndrome)    Smokers' cough (HCC)    Wears dentures    full upper and lower    Past Surgical History:   Procedure Laterality Date   CARPAL TUNNEL RELEASE Left 11/20/2022   Procedure: CARPAL TUNNEL RELEASE;  Surgeon: Reinaldo Berber, MD;  Location: [facility] OR[facility];  Service: Orthopedics;  Laterality: Left;   CATARACT EXTRACTION W/PHACO Right 02/20/2020   Procedure: CATARACT EXTRACTION PHACO AND INTRAOCULAR LENS PLACEMENT (IOC) RIGHT 5.60 00:36.3;  Surgeon: Galen Manila, MD;  Location: [facility];  Service: Ophthalmology;  Laterality: Right;  Diabetic - insulin pump Requests not first   CATARACT EXTRACTION W/PHACO Left 03/05/2020   Procedure: CATARACT EXTRACTION PHACO AND INTRAOCULAR LENS PLACEMENT (IOC) LEFT 4.94 00:31.6;  Surgeon: Galen Manila, MD;  Location: [facility];  Service: Ophthalmology;  Laterality: Left;   HIP PINNING,CANNULATED Right 02/09/2022   Procedure: PERCUTANEOUS FIXATION OF FEMORAL NECK;  Surgeon: Deeann Saint, MD;  Location: [facility] ORS;  Service: Orthopedics;  Laterality: Right;   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 She has a 50 pack-year smoking history. She has never used smokeless tobacco. She reports that she does not drink alcohol and does not use drugs.  Allergies:  Allergies  Allergen Reactions   Cat Hair Extract Shortness Of Breath   Pregabalin Anaphylaxis  Erythromycin Other (See Comments)    Severe stomach upset    Medications: reviewed, no changes to patient's home medications  Results for orders placed or performed during the hospital encounter of 11/29/22 (from the past 48 hour(s))  Glucose, capillary     Status: Abnormal   Collection Time: 11/29/22  4:37 PM  Result Value Ref Range   Glucose-Capillary 119 (H) 70 - 99 mg/dL    Comment: Glucose reference range applies only  to samples taken after fasting for at least 8 hours.  C-reactive protein     Status: Abnormal   Collection Time: 11/29/22  5:15 PM  Result Value Ref Range   CRP 1.0 (H) <1.0 mg/dL    Comment: Performed at [facility], [address] [place], [place] [address]  Sedimentation rate     Status: Abnormal   Collection Time: 11/29/22  5:15 PM  Result Value Ref Range   Sed Rate 34 (H) 0 - 22 mm/hr    Comment: Performed at [facility], [address], [place], [place] [address]  Glucose, capillary     Status: Abnormal   Collection Time: 11/29/22  8:17 PM  Result Value Ref Range   Glucose-Capillary 367 (H) 70 - 99 mg/dL    Comment: Glucose reference range applies only to samples taken after fasting for at least 8 hours.  Glucose, capillary     Status: Abnormal   Collection Time: 11/30/22 12:01 AM  Result Value Ref Range   Glucose-Capillary 365 (H) 70 - 99 mg/dL    Comment: Glucose reference range applies only to samples taken after fasting for at least 8 hours.  Glucose, capillary     Status: Abnormal   Collection Time: 11/30/22  4:18 AM  Result Value Ref Range   Glucose-Capillary 141 (H) 70 - 99 mg/dL    Comment: Glucose reference range applies only to samples taken after fasting for at least 8 hours.  Glucose, capillary     Status: Abnormal   Collection Time: 11/30/22  7:42 AM  Result Value Ref Range   Glucose-Capillary 182 (H) 70 - 99 mg/dL    Comment: Glucose reference range applies only to samples taken after fasting for at least 8 hours.    MR WRIST LEFT WO CONTRAST  Result Date: 11/29/2022 CLINICAL DATA:  Septic arthritis suspected, hand, xray done. Increased swelling after surgical debridement 2 days ago EXAM: MRI OF THE LEFT HAND WITHOUT CONTRAST; MRI OF THE LEFT WRIST WITHOUT CONTRAST TECHNIQUE: Multiplanar, multisequence MR imaging of the left hand and left wrist was performed. No intravenous contrast was administered. COMPARISON:  X-ray 11/29/2022, MRI 11/19/2022  FINDINGS: Bones/Joint/Cartilage Possible nondisplaced fracture of the volar aspect of the capitate (series 4, image 30-31). No additional sites of fracture are identified. No malalignment. Similar degenerative changes of the hand and wrist, most notably at the first [facility] joint. Moderate sized distal radioulnar joint effusion with internal complexity and debris. Small effusions of the radiocarpal and midcarpal joints. Small second MCP joint effusion. No definite erosion. Mild patchy bone marrow edema throughout the carpus. No confluent low T1 marrow signal. Ligaments No acute ligamentous injury. Muscles and Tendons Generalized intramuscular edema of the hand and distal forearm musculature. No organized intramuscular fluid collection. Postsurgical changes from carpal tunnel release. Moderate flexor tenosynovitis of the wrist and hand, mildly improved compared to the prior MRI. Mild extensor carpi ulnaris tenosynovitis. Otherwise, no significant extensor tenosynovitis. Soft tissues Extensive subcutaneous edema of the hand, more pronounced dorsally.  No drainable fluid collections identified on noncontrast sequences. IMPRESSION: 1. Moderate sized distal radioulnar joint effusion with internal complexity and debris. Small effusions of the radiocarpal and midcarpal joints. Small second MCP joint effusion. Findings are nonspecific and may be reactive or represent septic arthritis. 2. Moderate flexor tenosynovitis of the wrist and hand, mildly improved compared to the prior MRI. 3. Mild extensor carpi ulnaris tenosynovitis. 4. Extensive subcutaneous edema of the hand, more pronounced dorsally. No drainable fluid collections identified on noncontrast sequences. 5. Generalized intramuscular edema of the hand and distal forearm musculature compatible with myositis. No organized intramuscular fluid collection. 6. Possible nondisplaced fracture of the volar aspect of the capitate. Electronically Signed   By: Duanne Guess D.O.    On: 11/29/2022 09:26   MR HAND LEFT WO CONTRAST  Result Date: 11/29/2022 CLINICAL DATA:  Septic arthritis suspected, hand, xray done. Increased swelling after surgical debridement 2 days ago EXAM: MRI OF THE LEFT HAND WITHOUT CONTRAST; MRI OF THE LEFT WRIST WITHOUT CONTRAST TECHNIQUE: Multiplanar, multisequence MR imaging of the left hand and left wrist was performed. No intravenous contrast was administered. COMPARISON:  X-ray 11/29/2022, MRI 11/19/2022 FINDINGS: Bones/Joint/Cartilage Possible nondisplaced fracture of the volar aspect of the capitate (series 4, image 30-31). No additional sites of fracture are identified. No malalignment. Similar degenerative changes of the hand and wrist, most notably at the first [facility] joint. Moderate sized distal radioulnar joint effusion with internal complexity and debris. Small effusions of the radiocarpal and midcarpal joints. Small second MCP joint effusion. No definite erosion. Mild patchy bone marrow edema throughout the carpus. No confluent low T1 marrow signal. Ligaments No acute ligamentous injury. Muscles and Tendons Generalized intramuscular edema of the hand and distal forearm musculature. No organized intramuscular fluid collection. Postsurgical changes from carpal tunnel release. Moderate flexor tenosynovitis of the wrist and hand, mildly improved compared to the prior MRI. Mild extensor carpi ulnaris tenosynovitis. Otherwise, no significant extensor tenosynovitis. Soft tissues Extensive subcutaneous edema of the hand, more pronounced dorsally. No drainable fluid collections identified on noncontrast sequences. IMPRESSION: 1. Moderate sized distal radioulnar joint effusion with internal complexity and debris. Small effusions of the radiocarpal and midcarpal joints. Small second MCP joint effusion. Findings are nonspecific and may be reactive or represent septic arthritis. 2. Moderate flexor tenosynovitis of the wrist and hand, mildly improved compared to the  prior MRI. 3. Mild extensor carpi ulnaris tenosynovitis. 4. Extensive subcutaneous edema of the hand, more pronounced dorsally. No drainable fluid collections identified on noncontrast sequences. 5. Generalized intramuscular edema of the hand and distal forearm musculature compatible with myositis. No organized intramuscular fluid collection. 6. Possible nondisplaced fracture of the volar aspect of the capitate. Electronically Signed   By: Duanne Guess D.O.   On: 11/29/2022 09:26   DG Hand Complete Left  Result Date: 11/29/2022 CLINICAL DATA:  Surgical debridement with discharge 2 days ago. Swelling. EXAM: LEFT HAND - COMPLETE 3+ VIEW COMPARISON:  11/19/2022 FINDINGS: Superficial punctate debris which appears to be along the skin surface/bandage of the palm. Pronounced soft tissue swelling to the dorsal hand. No underlying erosion, fracture, or soft tissue emphysema. Chondrocalcinosis. IMPRESSION: Pronounced soft tissue swelling to the dorsal hand. No soft tissue emphysema or underlying acute bony abnormality. Electronically Signed   By: Tiburcio Pea M.D.   On: 11/29/2022 06:36    ROS: 14 point review of systems negative except per HPI

## 2022-11-30 NOTE — Progress Notes (Signed)
PROGRESS NOTE   Suzanne Snyder  ZOX:[id number]    DOB: 22-Aug-1950    DOA: 11/29/2022  PCP: Gracelyn Nurse, MD   I have briefly reviewed patients previous medical records in [facility].   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 Aquilla Hacker at [facility] on 11/8, presented to [facility] ED on 11/17 for worsening pain, swelling and redness of left wrist.  Seen by general orthopedics and awaiting hand surgery input.   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 Aquilla Hacker at [facility] on 11/8P Presented back to [facility] ED on 11/17 for worsening pain, swelling and redness of left wrist.   Imaging concerning for tenosynovitis, effusions and possible septic arthritis and myositis. She was transferred to [facility] for further care by hand surgeon. Seen by general orthopedics/Dr. Dion Saucier and awaiting hand surgery input Eliquis on hold in anticipation of possible surgery.  Last dose of Eliquis was on 11/15. Inflammatory markers unremarkable.  CRP 1, ESR 34 and lactate normal.  No leukocytosis.  No fever. -No growth on surgical cultures (11/8) to date - was d/c on bactrim, but only home for a few hours before returning to ED. Was treated with IV ceftriaxone and vancomycin during that hospitalization from 11/8 - 11/12.    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  Currently off of insulin pump send on subcutaneous insulin On Lantus 5 units at bedtime, NovoLog mealtime 2 units 3 times daily and sensitive SSI without bedtime scale. Labile and mildly uncontrolled CBGs but since this morning, in the 140-180 range. Follow closely and adjust insulins as needed.   Portal vein obstruction Was on eliquis, but does not take this at home. She states a physician told her to stop this. She was on it during her most recent hospitalization but has not had in 3 days, last dose reportedly 11/15 -continue to hold for possible surgery  - MR abdomen in April 2024 that did show evidence of chronic portal vein thrombosis. This was when diagnosed -admitting physician discussed with vascular at [facility] this month who recommended continued eliquis unless fall risk or repeat imaging shows no more obstruction.  -f/u with PCP as she does not take at home    Generalized anxiety disorder Continue hydroxyzine BID    RLS (restless legs syndrome) Continue requip at night    Tobacco abuse Nicotine patch  Encouraged cessation    Body mass index is 18.24 kg/m.   DVT prophylaxis: SCDs Start: 11/29/22 1356 Place TED hose Start: 11/29/22 1356     Code Status: Limited: Do not attempt resuscitation (DNR) -DNR-LIMITED -Do Not Intubate/DNI :  Family Communication: None at bedside. Disposition:  Status is: Observation Await hand surgery input.  If further interventions i.e. surgery or continued IV antibiotics recommended and continues to stay an additional night, then would be appropriate to change  to inpatient.  However if hand surgery recommends no surgery and DC on oral antibiotics, then may be appropriate to leave and observation.     Consultants:   Orthopedics  Procedures:   Left hand/wrist splint  Antimicrobials:   As above   Subjective:  Overall feels tired.  States that the pain, swelling and redness of left hand and fingers has  improved compared to admission.  Denies any other complaints.  Objective:   Vitals:   11/30/22 0019 11/30/22 0027 11/30/22 0500 11/30/22 0741  BP: (!) 111/92 (!) 111/92 115/78 (!) 118/57  Pulse: 64 64 69 (!) 56  Resp: 16 16 16    Temp: 98.3 F (36.8 C) 98.3 F (36.8 C) 98.4 F (36.9 C) 97.6 F (36.4 C)  TempSrc:  Oral Oral Oral  SpO2: 97%  98% 98%  Weight:  54.4 kg    Height:  5\' 8"  (1.727 m)      General exam: Elderly female, moderately built and nourished lying comfortably propped up in bed without distress. Respiratory system: Scattered occasional bilateral rhonchi, no crackles and fair air entry. Respiratory effort normal. Cardiovascular system: S1 & S2 heard, RRR. No JVD, murmurs, rubs, gallops or clicks. No pedal edema. Gastrointestinal system: Abdomen is nondistended, soft and nontender. No organomegaly or masses felt. Normal bowel sounds heard. Central nervous system: Alert and oriented. No focal neurological deficits. Extremities: Symmetric 5 x 5 power.  Left wrist and hand splint in place.  Mild erythema and swelling of proximal fingers, able to move well, color and vascular filling intact. Skin: No rashes, lesions or ulcers Psychiatry: Judgement and insight appear normal. Mood & affect appropriate.     Data Reviewed:   I have personally reviewed following labs and imaging studies   CBC: Recent Labs  Lab 11/25/22 0549 11/29/22 0623  WBC 5.1 8.2  NEUTROABS  --  4.6  HGB 10.9* 12.2  HCT 33.1* 37.5  MCV 87.3 87.4  PLT 225 360    Basic Metabolic Panel: Recent Labs  Lab 11/25/22 0549 11/29/22 0623  NA  --  132*  K  --  4.5  CL  --  101  CO2  --  20*  GLUCOSE  --  100*  BUN  --  48*  CREATININE 0.72 1.04*  CALCIUM  --  9.0    Liver Function Tests: Recent Labs  Lab 11/29/22 0623  AST 16  ALT 20  ALKPHOS 73  BILITOT 0.2  PROT 6.8  ALBUMIN 3.7    CBG: Recent Labs  Lab 11/30/22 0001 11/30/22 0418 11/30/22 0742  GLUCAP 365* 141* 182*     Microbiology Studies:   Recent Results (from the past 240 hour(s))  Aerobic/Anaerobic Culture w Gram Stain (surgical/deep wound)     Status: None   Collection Time: 11/20/22 11:38 AM   Specimen: Path fluid; Body Fluid  Result Value Ref Range Status   Specimen Description ABSCESS  Final   Special Requests LEFT WRIST  Final   Gram Stain   Final    FEW WBC PRESENT,BOTH PMN AND MONONUCLEAR NO ORGANISMS SEEN    Culture   Final    No growth aerobically or anaerobically. Performed at [facility], [address], [place], [place] [address]    Report Status 11/25/2022 FINAL  Final  Aerobic/Anaerobic Culture w Gram Stain (surgical/deep wound)     Status: None   Collection Time: 11/20/22 11:39 AM   Specimen: Path fluid; Body Fluid  Result Value Ref Range Status  Specimen Description WOUND  Final   Special Requests LEFT WRIST  Final   Gram Stain NO WBC SEEN NO ORGANISMS SEEN   Final   Culture   Final    No growth aerobically or anaerobically. Performed at [facility], [address] [place], [place] [address]    Report Status 11/25/2022 FINAL  Final  Aerobic/Anaerobic Culture w Gram Stain (surgical/deep wound)     Status: None   Collection Time: 11/20/22 11:51 AM   Specimen: Path fluid; Body Fluid  Result Value Ref Range Status   Specimen Description WOUND  Final   Special Requests LEFT HAND PALM FLEXOR TENDON  Final   Gram Stain   Final    RARE WBC PRESENT, PREDOMINANTLY PMN NO ORGANISMS SEEN    Culture   Final    No growth aerobically or anaerobically. Performed at [facility], [address] [place], [place] [address]    Report Status 11/25/2022 FINAL  Final    Radiology Studies:  MR WRIST LEFT WO CONTRAST  Result Date: 11/29/2022 CLINICAL DATA:  Septic arthritis suspected, hand, xray done. Increased swelling after surgical debridement 2 days ago EXAM: MRI OF THE LEFT HAND WITHOUT CONTRAST; MRI OF THE LEFT WRIST WITHOUT CONTRAST TECHNIQUE:  Multiplanar, multisequence MR imaging of the left hand and left wrist was performed. No intravenous contrast was administered. COMPARISON:  X-ray 11/29/2022, MRI 11/19/2022 FINDINGS: Bones/Joint/Cartilage Possible nondisplaced fracture of the volar aspect of the capitate (series 4, image 30-31). No additional sites of fracture are identified. No malalignment. Similar degenerative changes of the hand and wrist, most notably at the first [facility] joint. Moderate sized distal radioulnar joint effusion with internal complexity and debris. Small effusions of the radiocarpal and midcarpal joints. Small second MCP joint effusion. No definite erosion. Mild patchy bone marrow edema throughout the carpus. No confluent low T1 marrow signal. Ligaments No acute ligamentous injury. Muscles and Tendons Generalized intramuscular edema of the hand and distal forearm musculature. No organized intramuscular fluid collection. Postsurgical changes from carpal tunnel release. Moderate flexor tenosynovitis of the wrist and hand, mildly improved compared to the prior MRI. Mild extensor carpi ulnaris tenosynovitis. Otherwise, no significant extensor tenosynovitis. Soft tissues Extensive subcutaneous edema of the hand, more pronounced dorsally. No drainable fluid collections identified on noncontrast sequences. IMPRESSION: 1. Moderate sized distal radioulnar joint effusion with internal complexity and debris. Small effusions of the radiocarpal and midcarpal joints. Small second MCP joint effusion. Findings are nonspecific and may be reactive or represent septic arthritis. 2. Moderate flexor tenosynovitis of the wrist and hand, mildly improved compared to the prior MRI. 3. Mild extensor carpi ulnaris tenosynovitis. 4. Extensive subcutaneous edema of the hand, more pronounced dorsally. No drainable fluid collections identified on noncontrast sequences. 5. Generalized intramuscular edema of the hand and distal forearm musculature compatible with  myositis. No organized intramuscular fluid collection. 6. Possible nondisplaced fracture of the volar aspect of the capitate. Electronically Signed   By: Duanne Guess D.O.   On: 11/29/2022 09:26   MR HAND LEFT WO CONTRAST  Result Date: 11/29/2022 CLINICAL DATA:  Septic arthritis suspected, hand, xray done. Increased swelling after surgical debridement 2 days ago EXAM: MRI OF THE LEFT HAND WITHOUT CONTRAST; MRI OF THE LEFT WRIST WITHOUT CONTRAST TECHNIQUE: Multiplanar, multisequence MR imaging of the left hand and left wrist was performed. No intravenous contrast was administered. COMPARISON:  X-ray 11/29/2022, MRI 11/19/2022 FINDINGS: Bones/Joint/Cartilage Possible nondisplaced fracture of the volar aspect of the capitate (series 4,  image 30-31). No additional sites of fracture are identified. No malalignment. Similar degenerative changes of the hand and wrist, most notably at the first [facility] joint. Moderate sized distal radioulnar joint effusion with internal complexity and debris. Small effusions of the radiocarpal and midcarpal joints. Small second MCP joint effusion. No definite erosion. Mild patchy bone marrow edema throughout the carpus. No confluent low T1 marrow signal. Ligaments No acute ligamentous injury. Muscles and Tendons Generalized intramuscular edema of the hand and distal forearm musculature. No organized intramuscular fluid collection. Postsurgical changes from carpal tunnel release. Moderate flexor tenosynovitis of the wrist and hand, mildly improved compared to the prior MRI. Mild extensor carpi ulnaris tenosynovitis. Otherwise, no significant extensor tenosynovitis. Soft tissues Extensive subcutaneous edema of the hand, more pronounced dorsally. No drainable fluid collections identified on noncontrast sequences. IMPRESSION: 1. Moderate sized distal radioulnar joint effusion with internal complexity and debris. Small effusions of the radiocarpal and midcarpal joints. Small second MCP  joint effusion. Findings are nonspecific and may be reactive or represent septic arthritis. 2. Moderate flexor tenosynovitis of the wrist and hand, mildly improved compared to the prior MRI. 3. Mild extensor carpi ulnaris tenosynovitis. 4. Extensive subcutaneous edema of the hand, more pronounced dorsally. No drainable fluid collections identified on noncontrast sequences. 5. Generalized intramuscular edema of the hand and distal forearm musculature compatible with myositis. No organized intramuscular fluid collection. 6. Possible nondisplaced fracture of the volar aspect of the capitate. Electronically Signed   By: Duanne Guess D.O.   On: 11/29/2022 09:26   DG Hand Complete Left  Result Date: 11/29/2022 CLINICAL DATA:  Surgical debridement with discharge 2 days ago. Swelling. EXAM: LEFT HAND - COMPLETE 3+ VIEW COMPARISON:  11/19/2022 FINDINGS: Superficial punctate debris which appears to be along the skin surface/bandage of the palm. Pronounced soft tissue swelling to the dorsal hand. No underlying erosion, fracture, or soft tissue emphysema. Chondrocalcinosis. IMPRESSION: Pronounced soft tissue swelling to the dorsal hand. No soft tissue emphysema or underlying acute bony abnormality. Electronically Signed   By: Tiburcio Pea M.D.   On: 11/29/2022 06:36    Scheduled Meds:    gabapentin  600 mg Oral BID   hydrOXYzine  50 mg Oral BID   insulin aspart  0-9 Units Subcutaneous TID WC   insulin aspart  2 Units Subcutaneous TID WC   insulin glargine-yfgn  5 Units Subcutaneous QHS   iron polysaccharides  150 mg Oral Daily   nicotine  21 mg Transdermal Daily   pantoprazole  40 mg Oral Daily   rOPINIRole  1.5 mg Oral QHS    Continuous Infusions:    ceFEPime (MAXIPIME) IV 2 g (11/30/22 0414)   metronidazole 500 mg (11/30/22 0516)   vancomycin 750 mg (11/29/22 1710)     LOS: 1 day     Marcellus Scott, MD,  FACP, [facility], [facility], [facility]   [facility] Ad[facility]      To contact the attending provider between 7A-7P or the covering provider during after hours 7P-7A, please log into the web site [email or web address] and access using universal [place] password for that web site. If you do not have the password, please call the hospital operator.  11/30/2022, 8:18 AM

## 2022-12-01 DIAGNOSIS — M65142 Other infective (teno)synovitis, left hand: Secondary | ICD-10-CM | POA: Diagnosis not present

## 2022-12-01 LAB — BASIC METABOLIC PANEL
Anion gap: 13 (ref 5–15)
Anion gap: 9 (ref 5–15)
Anion gap: 8 (ref 5–15)
BUN: 34 mg/dL — ABNORMAL HIGH (ref 8–23)
BUN: 34 mg/dL — ABNORMAL HIGH (ref 8–23)
BUN: 34 mg/dL — ABNORMAL HIGH (ref 8–23)
CO2: 10 mmol/L — ABNORMAL LOW (ref 22–32)
CO2: 16 mmol/L — ABNORMAL LOW (ref 22–32)
CO2: 18 mmol/L — ABNORMAL LOW (ref 22–32)
Calcium: 8.1 mg/dL — ABNORMAL LOW (ref 8.9–10.3)
Calcium: 7.6 mg/dL — ABNORMAL LOW (ref 8.9–10.3)
Calcium: 7.7 mg/dL — ABNORMAL LOW (ref 8.9–10.3)
Chloride: 110 mmol/L (ref 98–111)
Chloride: 105 mmol/L (ref 98–111)
Chloride: 107 mmol/L (ref 98–111)
Creatinine, Ser: 0.84 mg/dL (ref 0.44–1.00)
Creatinine, Ser: 0.84 mg/dL (ref 0.44–1.00)
Creatinine, Ser: 0.76 mg/dL (ref 0.44–1.00)
GFR, Estimated: >60 mL/min (ref >60)
GFR, Estimated: >60 mL/min (ref >60)
GFR, Estimated: >60 mL/min (ref >60)
Glucose, Bld: 140 mg/dL — ABNORMAL HIGH (ref 70–99)
Glucose, Bld: 248 mg/dL — ABNORMAL HIGH (ref 70–99)
Glucose, Bld: 123 mg/dL — ABNORMAL HIGH (ref 70–99)
Potassium: 5.3 mmol/L — ABNORMAL HIGH (ref 3.5–5.1)
Potassium: 4.9 mmol/L (ref 3.5–5.1)
Potassium: 4.5 mmol/L (ref 3.5–5.1)
Sodium: 133 mmol/L — ABNORMAL LOW (ref 135–145)
Sodium: 130 mmol/L — ABNORMAL LOW (ref 135–145)
Sodium: 133 mmol/L — ABNORMAL LOW (ref 135–145)

## 2022-12-01 LAB — GLUCOSE, CAPILLARY
Glucose-Capillary: 232 mg/dL — ABNORMAL HIGH (ref 70–99)
Glucose-Capillary: 265 mg/dL — ABNORMAL HIGH (ref 70–99)
Glucose-Capillary: 130 mg/dL — ABNORMAL HIGH (ref 70–99)
Glucose-Capillary: 157 mg/dL — ABNORMAL HIGH (ref 70–99)
Glucose-Capillary: 135 mg/dL — ABNORMAL HIGH (ref 70–99)

## 2022-12-01 LAB — SURGICAL PCR SCREEN
MRSA, PCR: NEGATIVE
Staphylococcus aureus: NEGATIVE

## 2022-12-01 LAB — BETA-HYDROXYBUTYRIC ACID: Beta-Hydroxybutyric Acid: 0.12 mmol/L (ref 0.05–0.27)

## 2022-12-01 LAB — LACTIC ACID, PLASMA
Lactic Acid, Venous: 1.4 mmol/L (ref 0.5–1.9)
Lactic Acid, Venous: 1.8 mmol/L (ref 0.5–1.9)

## 2022-12-01 MED ORDER — SODIUM BICARBONATE 650 MG PO TABS: 650.0000 mg | ORAL_TABLET | Freq: Three times a day (TID) | ORAL | 0 refills | Status: DC

## 2022-12-01 MED ORDER — VITAMIN D (ERGOCALCIFEROL) 1.25 MG (50000 UNIT) PO CAPS: 50000.0000 [IU] | ORAL_CAPSULE | ORAL | Status: DC

## 2022-12-01 MED ORDER — ALBUTEROL SULFATE HFA 108 (90 BASE) MCG/ACT IN AERS: 1.0000 | INHALATION_SPRAY | Freq: Four times a day (QID) | RESPIRATORY_TRACT | Status: DC | PRN

## 2022-12-01 MED ORDER — STERILE WATER FOR INJECTION IV SOLN
INTRAVENOUS | Status: DC
  Filled 2022-12-01: qty 1000

## 2022-12-01 MED ORDER — NICOTINE 21 MG/24HR TD PT24: 21.0000 mg | MEDICATED_PATCH | Freq: Every day | TRANSDERMAL | 0 refills | Status: DC

## 2022-12-01 MED ORDER — VITAMIN D (ERGOCALCIFEROL) 1.25 MG (50000 UNIT) PO CAPS: 150000.0000 [IU] | ORAL_CAPSULE | ORAL | Status: DC

## 2022-12-01 MED ORDER — SODIUM BICARBONATE 650 MG PO TABS
650.0000 mg | ORAL_TABLET | Freq: Three times a day (TID) | ORAL | Status: DC
  Administered 2022-12-01 (×2): 650 mg via ORAL
  Filled 2022-12-01 (×2): qty 1

## 2022-12-01 MED ORDER — SODIUM ZIRCONIUM CYCLOSILICATE 10 G PO PACK: 10.0000 g | PACK | ORAL | Status: DC

## 2022-12-01 MED ORDER — TERBINAFINE HCL 1 % EX CREA: 1.0000 | TOPICAL_CREAM | Freq: Every day | CUTANEOUS | Status: DC | PRN

## 2022-12-01 NOTE — Discharge Instructions (Signed)

## 2022-12-01 NOTE — Progress Notes (Signed)
Orthopedic Hand Surgery Progress Note:  Reason for Consult: Left hand pain and swelling Referring Physician: Dr. Waymon Amato   HPI: Suzanne Snyder is a(an) 72 y.o. female who presented to [facility] emergency department on 11/29/2022 for left hand pain and swelling. She underwent left volar wrist abscess I&D, left carpal tunnel release, left hand flexor tendon I&D on 11/20/2022 at [facility]. She was admitted to [facility] on 11/29/2022 for worsening pain after discharge. She has been treated with IV antibiotics. She reports her hand is much better today. Her pain has improved. No systemic symptoms.    Physical Exam: Left Upper Extremity Incision over volar wrist, clean/dry/intact - sutures in place No drainage Mild swelling dorsal MCPs Non tender to palpation throughout wrist and hand She is able to fully extend the digits She can make 70% of a composite fist No pain with micro motion of the digits or wrist Sensation intact to light touch Hand warm and well perfused    Assessment/Plan: - S/P I&D of the volar wrist/flexor tendons on 11/20/2022 with Dr. Audelia Acton at [facility] - Ongoing pain after discharge, readmitted, started IV antibiotics, much improved, no ongoing signs of infection - WBC low yesterday - MRI without drainable fluid collection - Ok for discharge today with oral antibiotics  - Encouraged elevation and finger range of motion  - She has followed up scheduled with Dr. Audelia Acton tomorrow, encouraged her to keep that appointment - Return precautions discussed     [name] PA-C Orthopaedic Hand Surgery [facility] Office number: [phone] [address], [address] [place], [place] [address]    Past Medical History:  Diagnosis Date   Anxiety    Arthritis    Asthma    Diabetes mellitus without complication (HCC)    Insulin pump in place    Osteoporosis    RLS (restless legs syndrome)    Smokers' cough (HCC)    Wears dentures    full upper and lower    Past Surgical  History:  Procedure Laterality Date   CARPAL TUNNEL RELEASE Left 11/20/2022   Procedure: CARPAL TUNNEL RELEASE;  Surgeon: Reinaldo Berber, MD;  Location: [facility] ORS;  Service: Orthopedics;  Laterality: Left;   CATARACT EXTRACTION W/PHACO Right 02/20/2020   Procedure: CATARACT EXTRACTION PHACO AND INTRAOCULAR LENS PLACEMENT (IOC) RIGHT 5.60 00:36.3;  Surgeon: Galen Manila, MD;  Location: [facility];  Service: Ophthalmology;  Laterality: Right;  Diabetic - insulin pump Requests not first   CATARACT EXTRACTION W/PHACO Left 03/05/2020   Procedure: CATARACT EXTRACTION PHACO AND INTRAOCULAR LENS PLACEMENT (IOC) LEFT 4.94 00:31.6;  Surgeon: Galen Manila, MD;  Location: [facility];  Service: Ophthalmology;  Laterality: Left;   HIP PINNING,CANNULATED Right 02/09/2022   Procedure: PERCUTANEOUS FIXATION OF FEMORAL NECK;  Surgeon: Deeann Saint, MD;  Location: [facility] ORS;  Service: Orthopedics;  Laterality: Right;   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 She has a 50 pack-year smoking history. She has never used smokeless tobacco. She reports that she does not drink alcohol and does not use drugs.  Allergies:  Allergies  Allergen Reactions   Cat Hair Extract Shortness Of Breath   Pregabalin Anaphylaxis   Erythromycin Other (See  Comments)    Severe stomach upset    Medications: reviewed, no changes to patient's home medications  Results for orders placed or performed during the hospital encounter of 11/29/22 (from the past 48 hour(s))  Glucose, capillary     Status: Abnormal   Collection Time: 11/29/22  4:37 PM  Result Value Ref Range   Glucose-Capillary 119 (H) 70 - 99 mg/dL    Comment: Glucose reference range  applies only to samples taken after fasting for at least 8 hours.  C-reactive protein     Status: Abnormal   Collection Time: 11/29/22  5:15 PM  Result Value Ref Range   CRP 1.0 (H) <1.0 mg/dL    Comment: Performed at [facility], [address], [place], [place] [address]  Sedimentation rate     Status: Abnormal   Collection Time: 11/29/22  5:15 PM  Result Value Ref Range   Sed Rate 34 (H) 0 - 22 mm/hr    Comment: Performed at [facility], [address], [place], [place] [address]  Glucose, capillary     Status: Abnormal   Collection Time: 11/29/22  8:17 PM  Result Value Ref Range   Glucose-Capillary 367 (H) 70 - 99 mg/dL    Comment: Glucose reference range applies only to samples taken after fasting for at least 8 hours.  Glucose, capillary     Status: Abnormal   Collection Time: 11/30/22 12:01 AM  Result Value Ref Range   Glucose-Capillary 365 (H) 70 - 99 mg/dL    Comment: Glucose reference range applies only to samples taken after fasting for at least 8 hours.  Glucose, capillary     Status: Abnormal   Collection Time: 11/30/22  4:18 AM  Result Value Ref Range   Glucose-Capillary 141 (H) 70 - 99 mg/dL    Comment: Glucose reference range applies only to samples taken after fasting for at least 8 hours.  Glucose, capillary     Status: Abnormal   Collection Time: 11/30/22  7:42 AM  Result Value Ref Range   Glucose-Capillary 182 (H) 70 - 99 mg/dL    Comment: Glucose reference range applies only to samples taken after fasting for at least 8 hours.  Basic metabolic panel     Status: Abnormal   Collection Time: 11/30/22  8:05 AM  Result Value Ref Range   Sodium 130 (L) 135 - 145 mmol/L   Potassium 4.5 3.5 - 5.1 mmol/L   Chloride 106 98 - 111 mmol/L   CO2 14 (L) 22 - 32 mmol/L   Glucose, Bld 206 (H) 70 - 99 mg/dL    Comment: Glucose reference range applies only to samples taken after fasting for at least 8 hours.   BUN 36 (H) 8 - 23 mg/dL   Creatinine, Ser 2.44 (H)  0.44 - 1.00 mg/dL   Calcium 8.0 (L) 8.9 - 10.3 mg/dL   GFR, Estimated 56 (L) >60 mL/min    Comment: (NOTE) Calculated using the CKD-EPI Creatinine Equation (2021)    Anion gap 10 5 - 15    Comment: Performed at [facility], [address], [place], [place] [address]  CBC     Status: Abnormal   Collection Time: 11/30/22  8:05 AM  Result Value Ref Range   WBC 3.5 (L) 4.0 - 10.5 K/uL   RBC 4.17 3.87 - 5.11 MIL/uL   Hemoglobin 12.0 12.0 - 15.0 g/dL   HCT 25.3 66.4 - 40.3 %   MCV 87.5  80.0 - 100.0 fL   MCH 28.8 26.0 - 34.0 pg   MCHC 32.9 30.0 - 36.0 g/dL   RDW 16.1 09.6 - 04.5 %   Platelets 279 150 - 400 K/uL   nRBC 0.0 0.0 - 0.2 %    Comment: Performed at [facility], [address], [place], [place] [address]  Glucose, capillary     Status: Abnormal   Collection Time: 11/30/22 11:18 AM  Result Value Ref Range   Glucose-Capillary 171 (H) 70 - 99 mg/dL    Comment: Glucose reference range applies only to samples taken after fasting for at least 8 hours.  Glucose, capillary     Status: Abnormal   Collection Time: 11/30/22  4:22 PM  Result Value Ref Range   Glucose-Capillary 198 (H) 70 - 99 mg/dL    Comment: Glucose reference range applies only to samples taken after fasting for at least 8 hours.  Glucose, capillary     Status: Abnormal   Collection Time: 11/30/22  8:32 PM  Result Value Ref Range   Glucose-Capillary 242 (H) 70 - 99 mg/dL    Comment: Glucose reference range applies only to samples taken after fasting for at least 8 hours.  Glucose, capillary     Status: Abnormal   Collection Time: 11/30/22 11:52 PM  Result Value Ref Range   Glucose-Capillary 232 (H) 70 - 99 mg/dL    Comment: Glucose reference range applies only to samples taken after fasting for at least 8 hours.  Surgical pcr screen     Status: None   Collection Time: 12/01/22 12:51 AM   Specimen: Nasal Mucosa; Nasal Swab  Result Value Ref Range   MRSA, PCR NEGATIVE NEGATIVE   Staphylococcus  aureus NEGATIVE NEGATIVE    Comment: (NOTE) The Xpert SA Assay (FDA approved for NASAL specimens in patients 87 years of age and older), is one component of a comprehensive surveillance program. It is not intended to diagnose infection nor to guide or monitor treatment. Performed at [facility], [address] [place], [place] [address]   Glucose, capillary     Status: Abnormal   Collection Time: 12/01/22  4:18 AM  Result Value Ref Range   Glucose-Capillary 232 (H) 70 - 99 mg/dL    Comment: Glucose reference range applies only to samples taken after fasting for at least 8 hours.  Glucose, capillary     Status: Abnormal   Collection Time: 12/01/22  7:10 AM  Result Value Ref Range   Glucose-Capillary 265 (H) 70 - 99 mg/dL    Comment: Glucose reference range applies only to samples taken after fasting for at least 8 hours.    No results found.  ROS: 14 point review of systems negative except per HPI

## 2022-12-01 NOTE — Progress Notes (Signed)
OT EVALUATION  PT admitted with L hand pain s/p prior I&D on 11/8. Pt currently with functional limitiations due to the deficits listed below (see OT problem list). Pt lives alone and drives at baseline indep. Pt with limited family support and currently not driving herself. Pt reports she does all her cooking and cleaning. Pt noted to have balance deficits and recommendation for DME use. Pt educated on fine motor exercises and elevation of L UE.  Pt will benefit from skilled OT to increase their independence and safety with adls and balance to allow discharge HHOT/ home aide.   12/01/22 0800  OT Visit Information  Last OT Received On 12/01/22  Assistance Needed +1  History of Present Illness 72 yo female s/p I&D L volar wrist abscess L carpal tunnel release, L hand flexor tendon I &D 11/20/22 with d/c home. Admitted 11/17 worsening pain  Precautions  Precautions Fall  Restrictions  Weight Bearing Restrictions Yes  LUE Weight Bearing NWB  Other Position/Activity Restrictions NWB through wrist/hand  Home Living  Family/patient expects to be discharged to: Private residence  Living Arrangements Alone  Type of Home House  Home Access Stairs to enter  Entrance Stairs-Number of Steps 4 steps with L railing and Rt wall from garage entrance  Entrance Stairs-Rails Right;Left;Can reach both  Home Layout One level  Retail buyer (2 wheels);Rollator (4 wheels);Grab bars - tub/shower;Hand held shower head;BSC/3in1;Wheelchair - manual;Grab bars - toilet;Shower seat  Additional Comments bed rail; stool with rail (to get in/out of higher bed); 2 walking sticks, no animal  Prior Function  Prior Level of Function  Independent/Modified Independent;Driving  Mobility Comments Pt reports she would walk around home without assistive device unless foot hurting and uses walking stick. Uses walking stick in  community. Has tall bed and has step stool with handle she uses to get into bed.. Gets off bed by turning onto stomach and slidding onto her feet.  .  ADLs Comments Ind with self care and IADLs  Pain Assessment  Pain Assessment No/denies pain  Cognition  Arousal Alert  Behavior During Therapy WFL for tasks assessed/performed  Overall Cognitive Status Impaired/Different from baseline  Area of Impairment Safety/judgement  Safety/Judgement Decreased awareness of deficits  General Comments pt reluctant to allow family (A) and verbalized how hard it is to not be as active as she once was.  Communication  Communication No apparent difficulties  Upper Extremity Assessment  Upper Extremity Assessment Generalized weakness;Right hand dominant;LUE deficits/detail  LUE Deficits / Details pt with bandage misplaced and dressing falling out of the ace wrap. Wound redressed and rewrapped appropriately. Pt given two additional ace wraps for home. Pt educated on home HEP, return demo and elevation requirements  LUE Sensation decreased light touch  LUE Coordination decreased fine motor;decreased gross motor  Lower Extremity Assessment  Lower Extremity Assessment Defer to PT evaluation  Cervical / Trunk Assessment  Cervical / Trunk Assessment Kyphotic  Vision- History  Baseline Vision/History 1 Wears glasses  Ability to See in Adequate Light 1 Impaired  Vision- Assessment  Vision Assessment? No apparent visual deficits  ADL  Overall ADL's  Needs assistance/impaired  Eating/Feeding Modified independent;Sitting  Grooming Set up;Sitting  Lower Body Dressing Set up;Sit to/from stand  Lower Body Dressing Details (indicate cue type and reason) pt requires increased time and LOB sitting on surface of toilet. pt advised to move to a more even surface of  BSC with wider edge and able to complete task  Toilet Transfer Supervision/safety  Tub/Shower Transfer Details (indicate cue type and reason) pt verbalized  need for help and so OT contacted TOC about home aide with Medicare A and B benefits  Functional mobility during ADLs Minimal assistance  General ADL Comments pt noted to have LOB with head turns and requires stopping for cognitive challenges. pt advised to use DME. pt insist on using walking stick  Bed Mobility  Overal bed mobility Modified Independent  Transfers  Overall transfer level Needs assistance  Equipment used None  Transfers Sit to/from Stand  Sit to Stand Supervision  Balance  Overall balance assessment Needs assistance  Sitting-balance support No upper extremity supported;Feet supported  Sitting balance-Leahy Scale Good  Standing balance support Single extremity supported;During functional activity;Reliant on assistive device for balance  Standing balance-Leahy Scale Fair  Exercises  Exercises Other exercises  Other Exercises  Other Exercises [email or web address]    Access Code: [email or web address]  OT - End of Session  Equipment Utilized During Treatment Gait belt  Activity Tolerance Patient tolerated treatment well  Patient left in bed;with call bell/phone within reach;with bed alarm set  Nurse Communication Mobility status;Patient requests pain meds  OT Assessment  OT Recommendation/Assessment Patient needs continued OT Services  OT Visit Diagnosis Other abnormalities of gait and mobility (R26.89);Pain  Pain - Right/Left Left  Pain - part of body Hand;Arm;Shoulder  OT Problem List Decreased strength;Decreased range of motion;Decreased safety awareness;Decreased coordination;Impaired balance (sitting and/or standing);Decreased knowledge of use of DME or AE;Impaired UE functional use;Decreased activity tolerance  OT Plan  OT Frequency (ACUTE ONLY) Min 1X/week  OT Treatment/Interventions (ACUTE ONLY) Self-care/ADL training;Therapeutic exercise;Therapeutic activities;Cognitive remediation/compensation;DME and/or AE instruction;Balance training;Patient/family  education;Energy conservation;Neuromuscular education  AM-PAC OT "6 Clicks" Daily Activity Outcome Measure (Version 2)  Help from another person eating meals? 3  Help from another person taking care of personal grooming? 3  Help from another person toileting, which includes using toliet, bedpan, or urinal? 3  Help from another person bathing (including washing, rinsing, drying)? 3  Help from another person to put on and taking off regular upper body clothing? 3  Help from another person to put on and taking off regular lower body clothing? 3  6 Click Score 18  Progressive Mobility  What is the highest level of mobility based on the progressive mobility assessment? Level 5 (Walks with assist in room/hall) - Balance while stepping forward/back and can walk in room with assist - Complete  Mobility Referral Yes  Activity Ambulated with assistance to bathroom  OT Recommendation  Recommendations for Other Services PT consult  Follow Up Recommendations Home health OT  Patient can return home with the following A little help with walking and/or transfers;A little help with bathing/dressing/bathroom;Assistance with cooking/housework;Assist for transportation;Help with stairs or ramp for entrance  Functional Status Assessent Patient has had a recent decline in their functional status and demonstrates the ability to make significant improvements in function in a reasonable and predictable amount of time.  OT Equipment None recommended by OT  Individuals Consulted  Consulted and Agree with Results and Recommendations Patient  Acute Rehab OT Goals  Patient Stated Goal to be able to get help at home to shower  OT Goal Formulation With patient  Time For Goal Achievement 12/15/22  Potential to Achieve Goals Good  OT Time Calculation  OT Start Time (ACUTE ONLY) 1059  OT Stop Time (ACUTE ONLY) 1152  OT Time Calculation (min) 53 min  OT  General Charges  $OT Visit 1 Visit  OT Evaluation  $OT Eval  Moderate Complexity 1 Mod  OT Treatments  $Self Care/Home Management  23-37 mins   Brynn, OTR/L  Acute Rehabilitation Services Office: [phone] .

## 2022-12-01 NOTE — TOC Transition Note (Signed)
Transition of Care [facility]) - CM/SW Discharge Note   Patient Details  Name: Suzanne Snyder MRN: [id number] Date of Birth: [birth year]  [facility] CM/SW Contact:  Carmina Miller, LCSWA Phone Number: 12/01/2022, 10:44 PM   Clinical Narrative:     CSW received phone call stating pt was being dc and needed a taxi voucher. CSW confirmed with [facility] Supervisor that override could be provided considering [place] city [place]. Transport set up with [employer] for pt. CSW noticed no rider waiver on file, requested RN have pt sign RW and place in pt's shadow chart.         Patient Goals and CMS Choice      Discharge Placement                         Discharge Plan and Services Additional resources added to the After Visit Summary for                                       Social Determinants of Health (SDOH) Interventions SDOH Screenings   Food Insecurity: No Food Insecurity (11/29/2022)  Housing: Low Risk  (11/29/2022)  Transportation Needs: No Transportation Needs (11/29/2022)  Utilities: Not At Risk (11/29/2022)  Depression (PHQ2-9): Low Risk  (03/10/2022)  Financial Resource Strain: Low Risk  (07/01/2021)   Received from [facility], [facility]  Physical Activity: Inactive (07/01/2021)   Received from [facility], [facility]  Social Connections: Socially Isolated (07/01/2021)   Received from [facility], [facility]  Stress: Stress Concern Present (07/01/2021)   Received from [facility], [facility]  Tobacco Use: High Risk (11/29/2022)     Readmission Risk Interventions    05/07/2022    2:44 PM  Readmission Risk Prevention Plan  Transportation Screening Complete  Medication Review (RN Care Manager) Complete  HRI or Home Care Consult Complete  SW Recovery Care/Counseling Consult  Complete  Palliative Care Screening Not Applicable

## 2022-12-01 NOTE — Evaluation (Signed)
Physical Therapy Evaluation and Discharge Patient Details Name: Suzanne Snyder MRN: [id number] DOB: [birth year] Today's Date: 12/01/2022  History of Present Illness  72 yo female s/p I&D L volar wrist abscess L carpal tunnel release, L hand flexor tendon I &D 11/20/22 with d/c home. Admitted 11/17 worsening pain  Clinical Impression   Patient evaluated by Physical Therapy with no further acute PT needs identified. All education has been completed and the patient has no further questions. Patient can benefit from HHPT for home safety evaluation and further instruction in HEP for balance and strengthening. Patient in agreement.  PT is signing off. Thank you for this referral.         If plan is discharge home, recommend the following:     Can travel by private vehicle        Equipment Recommendations    Recommendations for Other Services       Functional Status Assessment Patient has had a recent decline in their functional status and demonstrates the ability to make significant improvements in function in a reasonable and predictable amount of time.     Precautions / Restrictions Precautions Precautions: Fall Precaution Comments: LUE very swollen, pain limited. LUE NWB Restrictions Weight Bearing Restrictions: Yes LUE Weight Bearing: Non weight bearing Other Position/Activity Restrictions: NWB through wrist/hand      Mobility  Bed Mobility Overal bed mobility: Modified Independent Bed Mobility: Supine to Sit, Sit to Supine     Supine to sit: HOB elevated, Modified independent (Device/Increase time) Sit to supine: HOB elevated, Modified independent (Device/Increase time)   General bed mobility comments: Good confidence and mobility, no assist needed    Transfers Overall transfer level: Independent Equipment used: None Transfers: Sit to/from Stand Sit to Stand: Independent           General transfer comment: x2 from EOB and toilet     Ambulation/Gait Ambulation/Gait assistance: Supervision Gait Distance (Feet): 15 Feet (x2) Assistive device: None Gait Pattern/deviations: Decreased stride length Gait velocity: slightly decreased     General Gait Details: Typically uses walking stick/trekking pole in RUE (not present in hospital). Supervision with no LOB  Stairs Stairs:  (pt deferred stating she had no concerns re; ability to get up her 4 steps with rail)          Wheelchair Mobility     Tilt Bed    Modified Rankin (Stroke Patients Only)       Balance Overall balance assessment: Needs assistance Sitting-balance support: No upper extremity supported, Feet supported Sitting balance-Leahy Scale: Good     Standing balance support: During functional activity, No upper extremity supported Standing balance-Leahy Scale: Good Standing balance comment: able to stand unsupported eyes closed x 10 sec; feet together x 30 sec, no imbalance; single leg stance x 10 seconds (with some wavering and movement of UEs to maintain balance)                 Standardized Balance Assessment Standardized Balance Assessment : Berg Balance Test Berg Balance Test Sit to Stand: Able to stand without using hands and stabilize independently Standing Unsupported: Able to stand safely 2 minutes Sitting with Back Unsupported but Feet Supported on Floor or Stool: Able to sit safely and securely 2 minutes Stand to Sit: Sits safely with minimal use of hands Transfers: Able to transfer safely, minor use of hands Standing Unsupported with Eyes Closed: Able to stand 10 seconds safely Standing Ubsupported with Feet Together: Able to place feet together independently and  stand 1 minute safely From Standing, Reach Forward with Outstretched Arm: Can reach confidently >25 cm (10") From Standing Position, Pick up Object from Floor: Able to pick up shoe safely and easily From Standing Position, Turn to Look Behind Over each Shoulder:  Looks behind from both sides and weight shifts well Standing Unsupported, One Foot in Front: Able to plae foot ahead of the other independently and hold 30 seconds Standing on One Leg: Able to lift leg independently and hold > 10 seconds         Pertinent Vitals/Pain Pain Assessment Pain Assessment: No/denies pain    Home Living Family/patient expects to be discharged to:: Private residence Living Arrangements: Alone   Type of Home: House Home Access: Stairs to enter Entrance Stairs-Rails: Right;Left;Can reach both Entrance Stairs-Number of Steps: 4 steps with L railing and Rt wall from garage entrance   Home Layout: One level Home Equipment: Agricultural consultant (2 wheels);Rollator (4 wheels);Grab bars - tub/shower;Hand held shower head;BSC/3in1;Wheelchair - manual;Grab bars - toilet;Shower seat Additional Comments: bed rail; stool with rail (to get in/out of higher bed); 2 walking sticks, no animal    Prior Function Prior Level of Function : Independent/Modified Independent;Driving             Mobility Comments: Pt reports she would walk around home without assistive device unless foot hurting and uses walking stick. Uses walking stick in community. Has tall bed and has step stool with handle she uses to get into bed.. Gets off bed by turning onto stomach and slidding onto her feet.  . ADLs Comments: Ind with self care and IADLs     Extremity/Trunk Assessment   Upper Extremity Assessment Upper Extremity Assessment: Defer to OT evaluation    Lower Extremity Assessment Lower Extremity Assessment: RLE deficits/detail RLE Deficits / Details: reports broken bones in rt foot that have never healed right and cause her pain at times; then she may use both walking sticks       Communication   Communication Communication: No apparent difficulties  Cognition Arousal: Alert Behavior During Therapy: WFL for tasks assessed/performed Overall Cognitive Status: Impaired/Different from  baseline Area of Impairment: Safety/judgement                         Safety/Judgement: Decreased awareness of deficits     General Comments: pt reluctant to allow family (A) and verbalized how hard it is to not be as active as she once was.        General Comments      Exercises Other Exercises Other Exercises: instructed in single leg stance at counter without UE support (but counter nearby for safety) and progress up to 30 seconds on one leg without wavering. Good for hip strengthening and balance   Assessment/Plan    PT Assessment All further PT needs can be met in the next venue of care  PT Problem List Decreased balance;Decreased safety awareness;Decreased mobility       PT Treatment Interventions DME instruction;Balance training;Gait training;Functional mobility training;Stair training;Patient/family education;Therapeutic activities;Therapeutic exercise    PT Goals (Current goals can be found in the Care Plan section)  Acute Rehab PT Goals Patient Stated Goal: to go home and get better    Frequency       Co-evaluation               AM-PAC PT "6 Clicks" Mobility  Outcome Measure Help needed turning from your back to your side while in  a flat bed without using bedrails?: None Help needed moving from lying on your back to sitting on the side of a flat bed without using bedrails?: None Help needed moving to and from a bed to a chair (including a wheelchair)?: None Help needed standing up from a chair using your arms (e.g., wheelchair or bedside chair)?: None Help needed to walk in hospital room?: None Help needed climbing 3-5 steps with a railing? : None 6 Click Score: 24    End of Session   Activity Tolerance: Patient tolerated treatment well Patient left: in bed;with call bell/phone within reach Nurse Communication: Mobility status;Other (comment) (HHPT) PT Visit Diagnosis: Muscle weakness (generalized) (M62.81);Other abnormalities of gait and  mobility (R26.89);Unsteadiness on feet (R26.81)    Time: 1206-1230 PT Time Calculation (min) (ACUTE ONLY): 24 min   Charges:   PT Evaluation $PT Eval Low Complexity: 1 Low   PT General Charges $$ ACUTE PT VISIT: 1 Visit          Jerolyn Center, PT Acute Rehabilitation Services  Office [phone]   Zena Amos 12/01/2022, 12:45 PM

## 2022-12-01 NOTE — Discharge Summary (Addendum)
Physician Discharge Summary  Suzanne Snyder XBM:[id number] DOB: [birth year]  PCP: Gracelyn Nurse, MD  Admitted from: Home Discharged to: Home  Admit date: 11/29/2022 Discharge date: 12/01/2022  Recommendations for Outpatient Follow-up:    Follow-up Information     Gracelyn Nurse, MD. Schedule an appointment as soon as possible for a visit in 1 week(s).   Specialty: Internal Medicine Why: To be seen with repeat labs (CBC & BMP). Contact information: [address] [facility] [place] [address] [phone]         Reinaldo Berber, MD Follow up on 12/02/2022.   Specialty: Orthopedic Surgery Why: Keep the appointment that you have for tomorrow for postop visit. Contact information: [address] [place] [address] [phone]                  Home Health: PT and OT    Equipment/Devices: None recommended by therapies.  PT said she does not need a cane anymore.    Discharge Condition: Improved and stable.   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 Aquilla Hacker at [facility] on 11/8,  presented to [facility] ED on 11/17 for worsening pain, swelling and redness of left wrist.  Seen by general orthopedics and hand surgery     Assessment & Plan:   Suppurative tenosynovitis of flexor tendon of left hand s/p I&D by Dr. Audelia Acton at [facility] on 11/8P Presented back to [facility] ED on 11/17 for worsening pain, swelling and redness of left wrist.   Imaging concerning for tenosynovitis, effusions and possible septic arthritis and myositis. She was transferred to [facility] for further care by hand surgeon.  Eliquis on hold in anticipation of possible surgery.  Last dose of Eliquis was on 11/15. Inflammatory markers unremarkable.  CRP 1, ESR 34 and lactate normal.  No leukocytosis.  No fever. -No growth on surgical cultures (11/8) to date - was d/c on bactrim, but only home for a few hours before returning to ED. Was treated with IV ceftriaxone and vancomycin during that hospitalization from 11/8 - 11/12.  -Hand surgery follow-up today appreciated.  Patient reports that her hand is much better today.  Pain has improved.  As per hand surgery, no ongoing signs of infection, MRI without drainable fluid collection, have cleared her for discharge home on oral antibiotics with elevation of the limb and finger range of motion.  Patient has a follow-up with Dr. Audelia Acton tomorrow 11/20.   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  Currently off of insulin pump send on subcutaneous insulin On Lantus 5 units at bedtime, NovoLog mealtime 2 units 3 times daily and sensitive SSI without bedtime scale. Labile and mildly uncontrolled CBGs but since this morning, in the 140-180 range. Resume insulin pump at DC. Patient follows with Dr. Tedd Sias, endocrinology. Bicarb of 14 yesterday, non-anion gap metabolic acidosis.  Anion gap 10.  Non-anion gap metabolic acidosis: BMP earlier on day of  discharge was abnormal with sodium of 133, potassium 5.3, bicarbonate 10 (14 yesterday), normal creatinine.  Anion gap 13.  Blood glucose 140. Etiology of anion gap metabolic acidosis unclear.  No GI losses reported. ?  RTA 4.  Serum lactate normal.  Low index of suspicion for DKA. Pharmacist then alerted that the BMP sample was hemolyzed.  Therefore repeated BMP Repeat BMP results as below, potassium normal but bicarb is 16. Started oral bicarbonate supplements with close follow-up of BMP as outpatient.   Portal vein obstruction Was on eliquis, but does not take this at home. She states a physician told her to stop this. She was on it during her most recent hospitalization but has not had in 3 days, last dose reportedly 11/15 -Was briefly held for potential surgery - MR abdomen in April 2024 that did show evidence of chronic portal vein thrombosis. This was when diagnosed -admitting physician discussed with vascular at [facility] this month who recommended continued eliquis unless fall risk or repeat imaging shows no more obstruction.  -Some confusion regarding the Eliquis.  As per DC summary from recent admission at [facility], it looks like to of the the same medications i.e. apixaban were listed and one of them was discontinued and patient was supposed to continue Eliquis at discharge. -Patient is advised to follow-up with her PCP who or the provider who prescribes the Eliquis regarding management of this medication. -As per recent discharge summary, patient was inconsistent in taking Eliquis prior to hospitalization at [facility].   Generalized anxiety disorder Continue hydroxyzine BID    RLS (restless legs syndrome) Continue requip at night    Tobacco abuse Nicotine patch  Encouraged cessation   Hypocalcemia/hypophosphatemia/history of severe vitamin D deficiency Patient on large doses of vitamin D supplements i.e. vitamin D 1 50,000 units 3 times a week for many years reportedly prescribed by her  endocrinologist.  Recent vitamin D levels 25.43.   Body mass index is 18.24 kg/m.       Consultants:   Orthopedics   Procedures:   Left hand/wrist splint    Discharge Instructions  Discharge Instructions     Call MD for:  difficulty breathing, headache or visual disturbances   Complete by: As directed    Call MD for:  extreme fatigue   Complete by: As directed    Call MD for:  persistant dizziness or light-headedness   Complete by: As directed    Call MD for:  persistant nausea and vomiting   Complete by: As directed    Call MD for:  redness, tenderness, or signs of infection (pain, swelling, redness, odor or green/yellow discharge around incision site)   Complete by: As directed    Call MD for:  severe uncontrolled pain   Complete by: As directed    Call MD for:  temperature >100.4   Complete by: As directed    Diet - low sodium heart healthy   Complete by: As directed    Diet  Carb Modified   Complete by: As directed    Increase activity slowly   Complete by: As directed         Medication List     STOP taking these medications    amLODipine 5 MG tablet Commonly known as: NORVASC   benzonatate 200 MG capsule Commonly known as: TESSALON   calcium carbonate 500 MG chewable tablet Commonly known as: TUMS - dosed in mg elemental calcium   celecoxib 100 MG capsule Commonly known as: CELEBREX   colchicine 0.6 MG tablet   Cyanocobalamin Powd   mupirocin ointment 2 % Commonly known as: BACTROBAN   traMADol 50 MG tablet Commonly known as: ULTRAM   Wixela Inhub 500-50 MCG/ACT Aepb Generic drug: fluticasone-salmeterol       TAKE these medications    acetaminophen 325 MG tablet Commonly known as: TYLENOL Take 2 tablets (650 mg total) by mouth every 6 (six) hours as needed for mild pain, moderate pain, fever or headache (or Fever >/= 101).   albuterol (2.5 MG/3ML) 0.083% nebulizer solution Commonly known as: PROVENTIL Take 2.5 mg by nebulization  every 4 (four) hours as needed for wheezing or shortness of breath. What changed: Another medication with the same name was changed. Make sure you understand how and when to take each.   albuterol 108 (90 Base) MCG/ACT inhaler Commonly known as: VENTOLIN HFA Inhale 1-2 puffs into the lungs every 6 (six) hours as needed for wheezing or shortness of breath. What changed: how much to take   apixaban 5 MG Tabs tablet Commonly known as: Eliquis Take 1 tablet (5 mg total) by mouth 2 (two) times daily.   clobetasol 0.05 % external solution Commonly known as: TEMOVATE Apply topically to itchy areas of scalp twice daily as needed for flares. Avoid applying to face, groin, and axilla. Use as directed.   clonazePAM 0.5 MG tablet Commonly known as: KLONOPIN Take 1 tablet (0.5 mg total) by mouth 2 (two) times daily as needed for anxiety.   cyclobenzaprine 10 MG tablet Commonly known as: FLEXERIL Take 10 mg by mouth 3 (three) times daily as needed for muscle spasms.   denosumab 60 MG/ML Sosy injection Commonly known as: PROLIA Inject 60 mg into the skin every 6 (six) months.   Fish Oil 1000 MG Caps Take 2 capsules by mouth daily.   furosemide 20 MG tablet Commonly known as: LASIX Take 20 mg by mouth daily as needed for edema or fluid.   gabapentin 300 MG capsule Commonly known as: NEURONTIN Take 900 mg by mouth 2 (two) times daily.   hydrocortisone 2.5 % cream Apply to itchy rash around eyes twice daily until clear.   hydrOXYzine 25 MG tablet Commonly known as: ATARAX Take 50 mg by mouth 2 (two) times daily.   insulin aspart 100 UNIT/ML injection Commonly known as: novoLOG Inject 40 Units into the skin daily. AS INSTRUCTED VIA OMNIPOD INSULIN PUMP   ipratropium 0.02 % nebulizer solution Commonly known as: ATROVENT Take 0.5 mg by nebulization every 6 (six) hours as needed for wheezing or shortness of breath.   iron polysaccharides 150 MG capsule Commonly known as:  NIFEREX Take 1 capsule (150 mg total) by mouth daily.   MULTIVITAMIN ADULT PO Take by mouth 2 (two) times daily.   nicotine 21 mg/24hr patch Commonly known as: NICODERM CQ - dosed in mg/24 hours Place 1 patch (21 mg total) onto the skin daily. Start taking on: December 02, 2022   Omnipod DASH Pods (Gen  4) Misc Inject 1 Pump into the skin every 3 (three) days.   Omnipod 5 DexG7G6 Pods Gen 5 Misc Inject 1 Pump into the skin every 3 (three) days.   oxyCODONE 5 MG immediate release tablet Commonly known as: Oxy IR/ROXICODONE Take 1 tablet (5 mg total) by mouth every 6 (six) hours as needed for moderate pain (pain score 4-6).   pantoprazole 40 MG tablet Commonly known as: Protonix Take 1 tablet (40 mg total) by mouth daily.   polyethylene glycol 17 g packet Commonly known as: MiraLax Take 17 g by mouth daily.   polyvinyl alcohol 1.4 % ophthalmic solution Commonly known as: LIQUIFILM TEARS Place 1 drop into both eyes as needed for dry eyes.   promethazine 12.5 MG tablet Commonly known as: PHENERGAN Take 12.5 mg by mouth every 4 (four) hours as needed for refractory nausea / vomiting.   promethazine 25 MG suppository Commonly known as: PHENERGAN Place 25 mg rectally as needed for refractory nausea / vomiting.   rOPINIRole 1 MG tablet Commonly known as: REQUIP Take 1.5 mg by mouth at bedtime.   sodium bicarbonate 650 MG tablet Take 1 tablet (650 mg total) by mouth 3 (three) times daily.   sulfamethoxazole-trimethoprim 800-160 MG tablet Commonly known as: BACTRIM DS Take 1 tablet by mouth every 12 (twelve) hours for 12 doses.   terbinafine 1 % cream Commonly known as: LAMISIL Apply 1 Application topically daily as needed (feet). Apply topically to feet twice daily for tinea pedis   Vitamin D (Ergocalciferol) 1.25 MG (50000 UNIT) Caps capsule Commonly known as: DRISDOL Take 3 capsules (150,000 Units total) by mouth 3 (three) times a week. Please verify dose with your  prescribing provider before taking it. Start taking on: December 02, 2022 What changed: additional instructions       Allergies  Allergen Reactions   Cat Hair Extract Shortness Of Breath   Pregabalin Anaphylaxis   Erythromycin Other (See Comments)    Severe stomach upset      Procedures/Studies: MR WRIST LEFT WO CONTRAST  Result Date: 11/29/2022 CLINICAL DATA:  Septic arthritis suspected, hand, xray done. Increased swelling after surgical debridement 2 days ago EXAM: MRI OF THE LEFT HAND WITHOUT CONTRAST; MRI OF THE LEFT WRIST WITHOUT CONTRAST TECHNIQUE: Multiplanar, multisequence MR imaging of the left hand and left wrist was performed. No intravenous contrast was administered. COMPARISON:  X-ray 11/29/2022, MRI 11/19/2022 FINDINGS: Bones/Joint/Cartilage Possible nondisplaced fracture of the volar aspect of the capitate (series 4, image 30-31). No additional sites of fracture are identified. No malalignment. Similar degenerative changes of the hand and wrist, most notably at the first [facility] joint. Moderate sized distal radioulnar joint effusion with internal complexity and debris. Small effusions of the radiocarpal and midcarpal joints. Small second MCP joint effusion. No definite erosion. Mild patchy bone marrow edema throughout the carpus. No confluent low T1 marrow signal. Ligaments No acute ligamentous injury. Muscles and Tendons Generalized intramuscular edema of the hand and distal forearm musculature. No organized intramuscular fluid collection. Postsurgical changes from carpal tunnel release. Moderate flexor tenosynovitis of the wrist and hand, mildly improved compared to the prior MRI. Mild extensor carpi ulnaris tenosynovitis. Otherwise, no significant extensor tenosynovitis. Soft tissues Extensive subcutaneous edema of the hand, more pronounced dorsally. No drainable fluid collections identified on noncontrast sequences. IMPRESSION: 1. Moderate sized distal radioulnar joint effusion  with internal complexity and debris. Small effusions of the radiocarpal and midcarpal joints. Small second MCP joint effusion. Findings are nonspecific and may be reactive  or represent septic arthritis. 2. Moderate flexor tenosynovitis of the wrist and hand, mildly improved compared to the prior MRI. 3. Mild extensor carpi ulnaris tenosynovitis. 4. Extensive subcutaneous edema of the hand, more pronounced dorsally. No drainable fluid collections identified on noncontrast sequences. 5. Generalized intramuscular edema of the hand and distal forearm musculature compatible with myositis. No organized intramuscular fluid collection. 6. Possible nondisplaced fracture of the volar aspect of the capitate. Electronically Signed   By: Duanne Guess D.O.   On: 11/29/2022 09:26   MR HAND LEFT WO CONTRAST  Result Date: 11/29/2022 CLINICAL DATA:  Septic arthritis suspected, hand, xray done. Increased swelling after surgical debridement 2 days ago EXAM: MRI OF THE LEFT HAND WITHOUT CONTRAST; MRI OF THE LEFT WRIST WITHOUT CONTRAST TECHNIQUE: Multiplanar, multisequence MR imaging of the left hand and left wrist was performed. No intravenous contrast was administered. COMPARISON:  X-ray 11/29/2022, MRI 11/19/2022 FINDINGS: Bones/Joint/Cartilage Possible nondisplaced fracture of the volar aspect of the capitate (series 4, image 30-31). No additional sites of fracture are identified. No malalignment. Similar degenerative changes of the hand and wrist, most notably at the first [facility] joint. Moderate sized distal radioulnar joint effusion with internal complexity and debris. Small effusions of the radiocarpal and midcarpal joints. Small second MCP joint effusion. No definite erosion. Mild patchy bone marrow edema throughout the carpus. No confluent low T1 marrow signal. Ligaments No acute ligamentous injury. Muscles and Tendons Generalized intramuscular edema of the hand and distal forearm musculature. No organized intramuscular  fluid collection. Postsurgical changes from carpal tunnel release. Moderate flexor tenosynovitis of the wrist and hand, mildly improved compared to the prior MRI. Mild extensor carpi ulnaris tenosynovitis. Otherwise, no significant extensor tenosynovitis. Soft tissues Extensive subcutaneous edema of the hand, more pronounced dorsally. No drainable fluid collections identified on noncontrast sequences. IMPRESSION: 1. Moderate sized distal radioulnar joint effusion with internal complexity and debris. Small effusions of the radiocarpal and midcarpal joints. Small second MCP joint effusion. Findings are nonspecific and may be reactive or represent septic arthritis. 2. Moderate flexor tenosynovitis of the wrist and hand, mildly improved compared to the prior MRI. 3. Mild extensor carpi ulnaris tenosynovitis. 4. Extensive subcutaneous edema of the hand, more pronounced dorsally. No drainable fluid collections identified on noncontrast sequences. 5. Generalized intramuscular edema of the hand and distal forearm musculature compatible with myositis. No organized intramuscular fluid collection. 6. Possible nondisplaced fracture of the volar aspect of the capitate. Electronically Signed   By: Duanne Guess D.O.   On: 11/29/2022 09:26   DG Hand Complete Left  Result Date: 11/29/2022 CLINICAL DATA:  Surgical debridement with discharge 2 days ago. Swelling. EXAM: LEFT HAND - COMPLETE 3+ VIEW COMPARISON:  11/19/2022 FINDINGS: Superficial punctate debris which appears to be along the skin surface/bandage of the palm. Pronounced soft tissue swelling to the dorsal hand. No underlying erosion, fracture, or soft tissue emphysema. Chondrocalcinosis. IMPRESSION: Pronounced soft tissue swelling to the dorsal hand. No soft tissue emphysema or underlying acute bony abnormality. Electronically Signed   By: Tiburcio Pea M.D.   On: 11/29/2022 06:36    Subjective: Denies complaints.  Not much pain in the left hand.  Insists on  diet being changed to regular diet.  Aware that surgeons have cleared her for discharge home.  Discharge Exam:  Vitals:   11/30/22 1924 12/01/22 0416 12/01/22 0711 12/01/22 1431  BP: 131/64 (!) 105/90 (!) 113/58 (!) 130/58  Pulse: 65 (!) 52 60 62  Resp:   18 18  Temp:  97.9 F (36.6 C) 97.6 F (36.4 C) 97.6 F (36.4 C) 98 F (36.7 C)  TempSrc: Oral Oral Oral Oral  SpO2: 98% 98% 96% 100%  Weight:      Height:        General exam: Elderly female, moderately built and nourished lying comfortably propped up in bed without distress. Respiratory system: Clear to auscultation.  No increased work of breathing. Cardiovascular system: S1 & S2 heard, RRR. No JVD, murmurs, rubs, gallops or clicks. No pedal edema. Gastrointestinal system: Abdomen is nondistended, soft and nontender. No organomegaly or masses felt. Normal bowel sounds heard. Central nervous system: Alert and oriented. No focal neurological deficits. Extremities: Symmetric 5 x 5 power.  Left wrist and hand splint in place.  Minimal patchy erythema but improved from prior and improved swelling of proximal fingers, able to move well, color and vascular filling intact. Skin: No rashes, lesions or ulcers Psychiatry: Judgement and insight appear normal. Mood & affect appropriate.     The results of significant diagnostics from this hospitalization (including imaging, microbiology, ancillary and laboratory) are listed below for reference.     Microbiology: Recent Results (from the past 240 hour(s))  Surgical pcr screen     Status: None   Collection Time: 12/01/22 12:51 AM   Specimen: Nasal Mucosa; Nasal Swab  Result Value Ref Range Status   MRSA, PCR NEGATIVE NEGATIVE Final   Staphylococcus aureus NEGATIVE NEGATIVE Final    Comment: (NOTE) The Xpert SA Assay (FDA approved for NASAL specimens in patients 78 years of age and older), is one component of a comprehensive surveillance program. It is not intended to diagnose  infection nor to guide or monitor treatment. Performed at [facility], [address], [place], [place] [address]      Labs: CBC: Recent Labs  Lab 11/25/22 0549 11/29/22 0623 11/30/22 0805  WBC 5.1 8.2 3.5*  NEUTROABS  --  4.6  --   HGB 10.9* 12.2 12.0  HCT 33.1* 37.5 36.5  MCV 87.3 87.4 87.5  PLT 225 360 279    Basic Metabolic Panel: Recent Labs  Lab 11/25/22 0549 11/29/22 0623 11/30/22 0805 12/01/22 1134 12/01/22 1407  NA  --  132* 130* 133* 130*  K  --  4.5 4.5 5.3* 4.9  CL  --  101 106 110 105  CO2  --  20* 14* 10* 16*  GLUCOSE  --  100* 206* 140* 248*  BUN  --  48* 36* 34* 34*  CREATININE 0.72 1.04* 1.07* 0.84 0.84  CALCIUM  --  9.0 8.0* 8.1* 7.6*    Liver Function Tests: Recent Labs  Lab 11/29/22 0623  AST 16  ALT 20  ALKPHOS 73  BILITOT 0.2  PROT 6.8  ALBUMIN 3.7    CBG: Recent Labs  Lab 11/30/22 2032 11/30/22 2352 12/01/22 0418 12/01/22 0710 12/01/22 1141  GLUCAP 242* 232* 232* 265* 130*   Attempted to reach patient's daughter via phone, unable to leave VM message. Has call blocker.  Time coordinating discharge: 35 minutes  SIGNED:  Marcellus Scott, MD,  FACP, [facility], [facility], [facility]   [facility] Ad[facility] [place]     To contact the attending provider between 7A-7P or the covering provider during after hours 7P-7A, please log into the web site [email or web address] and access using universal [place] password for that web site. If you do not have the password, please call the hospital operator.

## 2022-12-01 NOTE — Plan of Care (Signed)
  Problem: Education: Goal: Knowledge of General Education information will improve Description Including pain rating scale, medication(s)/side effects and non-pharmacologic comfort measures Outcome: Progressing   

## 2022-12-01 NOTE — Inpatient Diabetes Management (Signed)
Inpatient Diabetes Program Recommendations  AACE/ADA: New Consensus Statement on Inpatient Glycemic Control (2015)  Target Ranges:  Prepandial:   less than 140 mg/dL      Peak postprandial:   less than 180 mg/dL (1-2 hours)      Critically ill patients:  140 - 180 mg/dL   Lab Results  Component Value Date   GLUCAP 265 (H) 12/01/2022   HGBA1C 7.3 (H) 11/19/2022    Review of Glycemic Control  Latest Reference Range & Units 11/30/22 07:42 11/30/22 11:18 11/30/22 16:22 11/30/22 20:32 11/30/22 23:52 12/01/22 04:18 12/01/22 07:10  Glucose-Capillary 70 - 99 mg/dL 161 (H) 096 (H) 045 (H) 242 (H) 232 (H) 232 (H) 265 (H)  (H): Data is abnormally high  Diabetes history: T1D  Outpatient Diabetes medications:  Omnipod Dash 4th generation with Dexcom G7  Basal-12A-0.5/hr, 6a-0.75/hr, 8a-0.4/hr, 6p-0.45/hr (total-11.2) ICR-24 ISF-90 Target 120   Current orders for Inpatient glycemic control:  Semglee 5 units every day Novolog 5 units every day Novolog 0-15 units TID & 0-5 units at bedtime Novolog 2 units TID  Inpatient Diabetes Program Recommendations:    Please consider:  Semglee 7 units every day Novolog 0-9 units TID Novolog 4 units TID with meals if she consumes at least 50%  Will continue to follow while inpatient.  Thank you, Dulce Sellar, MSN, CDCES Diabetes Coordinator Inpatient Diabetes Program [phone] (team pager from 8a-5p)

## 2022-12-01 NOTE — Progress Notes (Signed)
Addendum  Repeat BMP reviewed and abnormal  Sodium 133, potassium 5.3, bicarbonate 10 (14 yesterday), creatinine normal.  Anion gap 13.  Glucose 140 Etiology of anion gap metabolic acidosis unclear. No GI losses reported.?  RTA 4  Check beta hydroxybutyrate, lactate, Lokelma 10 g x 1 Bicarbonate drip Follow BMP later this evening and in a.m. Discharge canceled. Updated patient's RN.  Marcellus Scott, MD,  FACP, [facility], [facility], [facility]   [facility] Ad[facility] [place]     To contact the attending provider between 7A-7P or the covering provider during after hours 7P-7A, please log into the web site [email or web address] and access using universal [place] [employer] password for that web site. If you do not have the password, please call the hospital operator.

## 2022-12-02 NOTE — Progress Notes (Signed)
Pt d/c to home late evening 11/19. PTA pt active with [facility]. Orders noted for home health services. Pt agreeable with continuing home health services provided by [facility], NCM made Lynette/[facility] liaison aware. [facility] to f/u with pt. Gae Gallop RN, [place] [phone]

## 2022-12-09 ENCOUNTER — Other Ambulatory Visit: Payer: Self-pay

## 2022-12-09 DIAGNOSIS — G8929 Other chronic pain: Secondary | ICD-10-CM | POA: Insufficient documentation

## 2022-12-09 DIAGNOSIS — E11649 Type 2 diabetes mellitus with hypoglycemia without coma: Secondary | ICD-10-CM | POA: Diagnosis not present

## 2022-12-09 DIAGNOSIS — R531 Weakness: Secondary | ICD-10-CM | POA: Diagnosis present

## 2022-12-09 LAB — CBC
HCT: 41.1 % (ref 36.0–46.0)
Hemoglobin: 13.7 g/dL (ref 12.0–15.0)
MCH: 28.5 pg (ref 26.0–34.0)
MCHC: 33.3 g/dL (ref 30.0–36.0)
MCV: 85.6 fL (ref 80.0–100.0)
Platelets: 380 10*3/uL (ref 150–400)
RBC: 4.8 MIL/uL (ref 3.87–5.11)
RDW: 15.7 % — ABNORMAL HIGH (ref 11.5–15.5)
WBC: 6.8 10*3/uL (ref 4.0–10.5)
nRBC: 0 % (ref 0.0–0.2)

## 2022-12-09 LAB — COMPREHENSIVE METABOLIC PANEL
ALT: 33 U/L (ref 0–44)
AST: 38 U/L (ref 15–41)
Albumin: 4.7 g/dL (ref 3.5–5.0)
Alkaline Phosphatase: 78 U/L (ref 38–126)
Anion gap: 12 (ref 5–15)
BUN: 38 mg/dL — ABNORMAL HIGH (ref 8–23)
CO2: 18 mmol/L — ABNORMAL LOW (ref 22–32)
Calcium: 9.3 mg/dL (ref 8.9–10.3)
Chloride: 107 mmol/L (ref 98–111)
Creatinine, Ser: 1.11 mg/dL — ABNORMAL HIGH (ref 0.44–1.00)
GFR, Estimated: 53 mL/min — ABNORMAL LOW (ref >60)
Glucose, Bld: 51 mg/dL — ABNORMAL LOW (ref 70–99)
Potassium: 4 mmol/L (ref 3.5–5.1)
Sodium: 137 mmol/L (ref 135–145)
Total Bilirubin: 0.6 mg/dL (ref <1.2)
Total Protein: 8.1 g/dL (ref 6.5–8.1)

## 2022-12-09 LAB — LIPASE, BLOOD: Lipase: 18 U/L (ref 11–51)

## 2022-12-09 NOTE — ED Triage Notes (Signed)
BIB [facility] from home. Pt complains of abd pain and diarrhea x 2 days.   EMS VS: 94% RA 186/79 HR 88 CBG 70

## 2022-12-09 NOTE — ED Triage Notes (Signed)
Pt states she has not had "stable blood sugars". Pt states is diabetic and has been having low blood sugars. Pt states has been having diarrhea. Pt is a poor historian. Per ems blood sugar in "70s". Pt with pwd skin, sutures noted to left hand/wrist.

## 2022-12-10 ENCOUNTER — Emergency Department
Admission: EM | Admit: 2022-12-10 | Discharge: 2022-12-10 | Disposition: A | Discharge status: Home / Self Care | Payer: Medicare Other | Attending: Emergency Medicine | Admitting: Emergency Medicine

## 2022-12-10 DIAGNOSIS — G8929 Other chronic pain: Secondary | ICD-10-CM

## 2022-12-10 DIAGNOSIS — E162 Hypoglycemia, unspecified: Secondary | ICD-10-CM

## 2022-12-10 LAB — CBG MONITORING, ED: Glucose-Capillary: 104 mg/dL — ABNORMAL HIGH (ref 70–99)

## 2022-12-10 NOTE — ED Notes (Signed)
Pts daughter called and asked for pt ride home in taxi. Pt is able to ride in taxi safe taxi called per nurse

## 2022-12-10 NOTE — ED Notes (Signed)
Patient given [place] sandwich tray

## 2022-12-10 NOTE — ED Notes (Signed)
Patient's hand cleaned and wrapped with non-stick gauze, kerlex and ace bandage per request.

## 2022-12-10 NOTE — ED Notes (Signed)
Patients daughter called and given update.

## 2022-12-10 NOTE — ED Notes (Signed)
Patient ambulated to and from restroom with steady gait

## 2022-12-10 NOTE — ED Provider Notes (Signed)
[facility] Provider Note    Event Date/Time   First MD Initiated Contact with Patient 12/10/22 [phone]     (approximate)   History   Diarrhea   HPI Suzanne Snyder is a 72 y.o. female who presents by EMS for low blood sugar.  She reports that she has an insulin pump that provides a basal level of insulin.  However she knows that she has to eat but she has not been doing so.  She could feel that her blood sugar got low because of the weakness and general malaise but rather than eating anything she called 911.  She reportedly told triage that she was having diarrhea but she adamantly denies this to me.  She says she just does not eat much and she told me at length about her extensive chronic orthopedic injuries and pain as a result.  However she said this is all normal for her.  She denies having chest pain, shortness of breath, nausea, vomiting, and abdominal pain.  She had a recent orthopedic surgery to the left hand as a result of an infection that required a drain but she said that it is hurting her the same amount that it has been and she has kept the wound and bandage clean and dry.  No recent fevers or chills.     Physical Exam   Triage Vital Signs: ED Triage Vitals  Encounter Vitals Group     BP 12/09/22 2154 (!) 156/60     Systolic BP Percentile --      Diastolic BP Percentile --      Pulse Rate 12/09/22 2154 87     Resp 12/09/22 2154 20     Temp 12/09/22 2154 97.9 F (36.6 C)     Temp Source 12/09/22 2154 Oral     SpO2 12/09/22 2154 100 %     Weight 12/09/22 2155 52.2 kg (115 lb)     Height 12/09/22 2155 1.727 m (5\' 8" )     Head Circumference --      Peak Flow --      Pain Score 12/09/22 2155 0     Pain Loc --      Pain Education --      Exclude from Growth Chart --     Most recent vital signs: Vitals:   12/09/22 2154 12/10/22 0747  BP: (!) 156/60 132/74  Pulse: 87 68  Resp: 20 18  Temp: 97.9 F (36.6 C) 98.1 F (36.7 C)  SpO2: 100%  98%    General: Awake, appears chronically ill but in no acute distress. CV:  Good peripheral perfusion.  Regular rate and rhythm. Resp:  Normal effort. Speaking easily and comfortably, no accessory muscle usage nor intercostal retractions.   Abd:  No distention.  No tenderness to palpation the abdomen. Other:  Patient has extensive bandage on the left hand and wrist.  I removed it to make sure there is no evidence of acute infection and it is well-appearing, no wound dehiscence, no cellulitis nor areas of fluctuance or induration.   ED Results / Procedures / Treatments   Labs (all labs ordered are listed, but only abnormal results are displayed) Labs Reviewed  COMPREHENSIVE METABOLIC PANEL - Abnormal; Notable for the following components:      Result Value   CO2 18 (*)    Glucose, Bld 51 (*)    BUN 38 (*)    Creatinine, Ser 1.11 (*)    GFR, Estimated 53 (*)  All other components within normal limits  CBC - Abnormal; Notable for the following components:   RDW 15.7 (*)    All other components within normal limits  CBG MONITORING, ED - Abnormal; Notable for the following components:   Glucose-Capillary 104 (*)    All other components within normal limits  LIPASE, BLOOD  URINALYSIS, ROUTINE W REFLEX MICROSCOPIC     EKG  ED ECG REPORT I, Loleta Rose, the attending physician, personally viewed and interpreted this ECG.  Date: 12/09/2022 EKG Time: 22: 13 Rate: 80 Rhythm: normal sinus rhythm with sinus arrhythmia versus atrial fibrillation QRS Axis: normal Intervals: normal ST/T Wave abnormalities: Non-specific ST segment / T-wave changes, but no clear evidence of acute ischemia. Narrative Interpretation: no definitive evidence of acute ischemia; does not meet STEMI criteria.  ED ECG REPORT I, Loleta Rose, the attending physician, personally viewed and interpreted this ECG.  Date: 12/09/2022 EKG Time: 22: 14 Rate: 77 Rhythm: sinus rhythm with short PR interval QRS  Axis: normal Intervals: normal ST/T Wave abnormalities: Non-specific ST segment / T-wave changes, but no clear evidence of acute ischemia. Narrative Interpretation: no definitive evidence of acute ischemia; does not meet STEMI criteria.    PROCEDURES:  Critical Care performed: No  Procedures    IMPRESSION / MDM / ASSESSMENT AND PLAN / ED COURSE  I reviewed the triage vital signs and the nursing notes.                              Differential diagnosis includes, but is not limited to, hypoglycemia, electrolyte abnormality, acute infectious process.  Patient's presentation is most consistent with acute presentation with potential threat to life or bodily function.  Labs/studies ordered: EKG x 2, CMP, lipase, CBC  Interventions/Medications given:  Medications - No data to display  (Note:  hospital course my include additional interventions and/or labs/studies not listed above.)   The patient's vital signs are stable.  Initial EKG was nondiagnostic and was immediately repeated and the repeat was a reassuring sinus rhythm.  Electrolytes and labs are all generally reassuring with no significant abnormalities.  The patient admits that she does not eat and drink like she is supposed to; she could but she does not want to do so.  She knows that if she does not eat, the basal insulin will likely make her hypoglycemic.  She was hypoglycemic in the ED although relatively asymptomatic.  After she ate a [place] sandwich meal, her blood sugar came up appropriately and she felt fine.  She has no evidence of acute infection at the site of her recent orthopedic procedure.  She is stable and appropriate for discharge and outpatient follow-up with no evidence of emergent medical condition requiring hospitalization.  I gave my usual and customary return precautions and the patient agrees with the plan for discharge and is in no distress.         FINAL CLINICAL IMPRESSION(S) / ED DIAGNOSES    Final diagnoses:  Hypoglycemia  Other chronic pain     Rx / DC Orders   ED Discharge Orders     None        Note:  This document was prepared using Dragon voice recognition software and may include unintentional dictation errors.   Loleta Rose, MD 12/10/22 [phone]

## 2022-12-10 NOTE — Discharge Instructions (Addendum)
Your evaluation tonight was reassuring, you just need to be sure and eat so that your insulin pump does not drop your blood sugar too low.  If you start to feel ill from hypoglycemia, please immediately eat something.  Follow-up with your regular doctor at the next available opportunity.

## 2022-12-10 NOTE — ED Notes (Addendum)
[name] called to make her aware the patient is up for discharge.  No answer.

## 2022-12-10 NOTE — ED Notes (Signed)
Pt provided discharge instructions and prescription information. Pt was given the opportunity to ask questions and questions were answered.   

## 2022-12-10 NOTE — ED Notes (Signed)
[name] called back, will come to pick her up.

## 2022-12-15 ENCOUNTER — Encounter: Payer: Self-pay | Admitting: Dermatology

## 2022-12-15 ENCOUNTER — Ambulatory Visit: Payer: Medicare Other | Admitting: Dermatology

## 2022-12-15 DIAGNOSIS — L209 Atopic dermatitis, unspecified: Secondary | ICD-10-CM | POA: Diagnosis not present

## 2022-12-15 DIAGNOSIS — Z79899 Other long term (current) drug therapy: Secondary | ICD-10-CM | POA: Diagnosis not present

## 2022-12-15 MED ORDER — DUPIXENT 300 MG/2ML ~~LOC~~ SOSY
300.0000 mg | PREFILLED_SYRINGE | SUBCUTANEOUS | 5 refills | Status: DC
Start: 2022-12-15 — End: 2023-04-12

## 2022-12-15 MED ORDER — DUPILUMAB 300 MG/2ML ~~LOC~~ SOSY
300.0000 mg | PREFILLED_SYRINGE | SUBCUTANEOUS | Status: AC
  Administered 2022-12-15 – 2023-04-12 (×3): 300 mg via SUBCUTANEOUS

## 2022-12-15 NOTE — Progress Notes (Signed)
   Follow-Up Visit   Subjective  Suzanne Snyder is a 72 y.o. female who presents for the following: Atopic Dermatitis. 2 month follow up.  Dupixent loading dose given 10/21/2022. Hands, fingers, forearms, scalp. Last injection was 11/04/2022. Was hospitalized for an infection/surgery when supposed to return to see Dr. Katrinka Blazing and get 3rd injection.  Surgery on 11/20/2022 for bacterial infection in left hand/wrist.   Patient states her eczema has already improved since starting Dupixent. Denies allergic reactions, adverse reaction or injection site reactions. Itching has greatly improved.   The following portions of the chart were reviewed this encounter and updated as appropriate: medications, allergies, medical history  Review of Systems:  No other skin or systemic complaints except as noted in HPI or Assessment and Plan.  Objective  Well appearing patient in no apparent distress; mood and affect are within normal limits.  Areas Examined: Face, hands, fingers  Relevant physical exam findings are noted in the Assessment and Plan.    Assessment & Plan   Atopic dermatitis, unspecified type  Related Medications dupilumab (DUPIXENT) prefilled syringe 300 mg      ATOPIC DERMATITIS, improved on dupixent, previously harming quality of life Exam: Scaly xerotic palmar patch on R hand. L hand bandaged. Clearance of eczema on forearms and eyes 5% BSA improved to 1-2% BSA  Chronic and persistent condition with duration or expected duration over one year. Condition is improving with treatment but not currently at goal.   Atopic dermatitis - Severe, on Dupixent (biologic medication).  Atopic dermatitis (eczema) is a chronic, relapsing, pruritic condition that can significantly affect quality of life. It is often associated with allergic rhinitis and/or asthma and can require treatment with topical medications, phototherapy, or in severe cases biologic medications, which require long term  medication management.    Treatment Plan:  Continue Dupixent 300 mg/25mL SQ QOW. Patient denies side effects. Prescription sent today.   Start Tacrolimus BID prn  Dupixent 300mg /76mL injected SQ into the right lower abdomen. Patient tolerated injection well.  NDC: 4098-1191-47 Lot: [id number] Exp: 08/2024   Potential side effects include allergic reaction, herpes infections, injection site reactions and conjunctivitis (inflammation of the eyes).  The use of Dupixent requires long term medication management, including periodic office visits.    Long term medication management.  Patient is using long term (months to years) prescription medication  to control their dermatologic condition.  These medications require periodic monitoring to evaluate for efficacy and side effects and may require periodic laboratory monitoring.   Recommend gentle skin care.   Return in about 6 months (around 06/15/2023) for Atopic Dermatitis/Dupixent Follow Up; Injection on nurse schedule in 2 weeks.  I, Lawson Radar, CMA, am acting as scribe for Elie Goody, MD.   Documentation: I have reviewed the above documentation for accuracy and completeness, and I agree with the above.  Elie Goody, MD

## 2022-12-15 NOTE — Patient Instructions (Addendum)
Continue Dupixent 300 mg/26mL injections every 2 weeks.   Start Tacrolimus 0.1% ointment twice daily to affected areas.   If Tacrolimus is too expensive do not purchase it.   Dupilumab (Dupixent) is a treatment given by injection for adults and children with moderate-to-severe atopic dermatitis. Goal is control of skin condition, not cure. It is given as 2 injections at the first dose followed by 1 injection ever 2 weeks thereafter.  Young children are dosed monthly.  Potential side effects include allergic reaction, herpes infections, injection site reactions and conjunctivitis (inflammation of the eyes).  The use of Dupixent requires long term medication management, including periodic office visits.     Due to recent changes in healthcare laws, you may see results of your pathology and/or laboratory studies on MyChart before the doctors have had a chance to review them. We understand that in some cases there may be results that are confusing or concerning to you. Please understand that not all results are received at the same time and often the doctors may need to interpret multiple results in order to provide you with the best plan of care or course of treatment. Therefore, we ask that you please give [place] 2 business days to thoroughly review all your results before contacting the office for clarification. Should we see a critical lab result, you will be contacted sooner.   If You Need Anything After Your Visit  If you have any questions or concerns for your doctor, please call our main line at [phone] and press option 4 to reach your doctor's medical assistant. If no one answers, please leave a voicemail as directed and we will return your call as soon as possible. Messages left after 4 pm will be answered the following business day.   You may also send [place] a message via MyChart. We typically respond to MyChart messages within 1-2 business days.  For prescription refills, please ask your  pharmacy to contact our office. Our fax number is [phone].  If you have an urgent issue when the clinic is closed that cannot wait until the next business day, you can page your doctor at the number below.    Please note that while we do our best to be available for urgent issues outside of office hours, we are not available 24/7.   If you have an urgent issue and are unable to reach [place], you may choose to seek medical care at your doctor's office, retail clinic, urgent care center, or emergency room.  If you have a medical emergency, please immediately call 911 or go to the emergency department.  Pager Numbers  - Dr. Gwen Pounds: [phone]  - Dr. Roseanne Reno: [phone]  - Dr. Katrinka Blazing: [phone]   In the event of inclement weather, please call our main line at [phone] for an update on the status of any delays or closures.  Dermatology Medication Tips: Please keep the boxes that topical medications come in in order to help keep track of the instructions about where and how to use these. Pharmacies typically print the medication instructions only on the boxes and not directly on the medication tubes.   If your medication is too expensive, please contact our office at [phone] option 4 or send [place] a message through MyChart.   We are unable to tell what your co-pay for medications will be in advance as this is different depending on your insurance coverage. However, we may be able to find a substitute medication at lower cost or fill  out paperwork to get insurance to cover a needed medication.   If a prior authorization is required to get your medication covered by your insurance company, please allow [place] 1-2 business days to complete this process.  Drug prices often vary depending on where the prescription is filled and some pharmacies may offer cheaper prices.  The website www.goodrx.com contains coupons for medications through different pharmacies. The prices here do not  account for what the cost may be with help from insurance (it may be cheaper with your insurance), but the website can give you the price if you did not use any insurance.  - You can print the associated coupon and take it with your prescription to the pharmacy.  - You may also stop by our office during regular business hours and pick up a GoodRx coupon card.  - If you need your prescription sent electronically to a different pharmacy, notify our office through [facility] or by phone at [phone] option 4.     Si Usted Necesita Algo Despus de Su Visita  Tambin puede enviarnos un mensaje a travs de Clinical cytogeneticist. Por lo general respondemos a los mensajes de MyChart en el transcurso de 1 a 2 das hbiles.  Para renovar recetas, por favor pida a su farmacia que se ponga en contacto con nuestra oficina. Annie Sable de fax es [place] [phone].  Si tiene un asunto urgente cuando la clnica est cerrada y que no puede esperar hasta el siguiente da hbil, puede llamar/localizar a su doctor(a) al nmero que aparece a continuacin.   Por favor, tenga en cuenta que aunque hacemos todo lo posible para estar disponibles para asuntos urgentes fuera del horario de [place], no estamos disponibles las 24 horas del da, los 7 [address]  [address] de la [place].   Si tiene un problema urgente y no puede comunicarse con nosotros, puede optar por buscar atencin mdica  en el consultorio de su doctor(a), en una clnica privada, en un centro de atencin urgente o en una sala de emergencias.  Si tiene Engineer, drilling, por favor llame inmediatamente al 911 o vaya a la sala de emergencias.  Nmeros de bper  - Dr. Gwen Pounds: [phone]  - Dra. Roseanne Reno: [phone]  - Dr. Katrinka Blazing: [phone]   En caso de inclemencias del tiempo, por favor llame a Lacy Duverney principal al [phone] para una actualizacin sobre el [place] de cualquier retraso o cierre.  Consejos para la medicacin en dermatologa: Por  favor, guarde las cajas en las que vienen los medicamentos de uso tpico para ayudarle a seguir las instrucciones sobre dnde y cmo usarlos. Las farmacias generalmente imprimen las instrucciones del medicamento slo en las cajas y no directamente en los tubos del [place].   Si su medicamento es muy caro, por favor, pngase en contacto con Rolm Gala llamando al [phone] y presione la opcin 4 o envenos un mensaje a travs de Clinical cytogeneticist.   No podemos decirle cul ser su copago por los medicamentos por adelantado ya que esto es diferente dependiendo de la cobertura de su seguro. Sin embargo, es posible que podamos encontrar un medicamento sustituto a Audiological scientist un formulario para que el seguro cubra el medicamento que se considera necesario.   Si se requiere una autorizacin previa para que su compaa de seguros [place] su medicamento, por favor permtanos de 1 a 2 das hbiles para completar [address]  Los precios de los medicamentos varan con frecuencia dependiendo del Environmental consultant de dnde se surte la receta  y [place] farmacias pueden ofrecer precios ms baratos.  El sitio web www.goodrx.com tiene cupones para medicamentos de Health and safety inspector. Los precios aqu no tienen en cuenta lo que podra costar con la ayuda del seguro (puede ser ms barato con su seguro), pero el sitio web puede darle el precio si no utiliz Tourist information centre manager.  - Puede imprimir el cupn correspondiente y llevarlo con su receta a la farmacia.  - Tambin puede pasar por nuestra oficina durante el horario de atencin regular y Education officer, museum una tarjeta de cupones de GoodRx.  - Si necesita que su receta se enve electrnicamente a una farmacia diferente, informe a nuestra oficina a travs de MyChart de [place] o por telfono llamando al [phone] y presione la opcin 4.

## 2022-12-29 ENCOUNTER — Ambulatory Visit: Payer: Medicare Other

## 2023-01-04 ENCOUNTER — Ambulatory Visit: Payer: Medicare Other

## 2023-01-04 DIAGNOSIS — L209 Atopic dermatitis, unspecified: Secondary | ICD-10-CM | POA: Diagnosis not present

## 2023-01-04 NOTE — Progress Notes (Signed)
Patient here today for Dupixent injection for severe atopic dermatitis.    Dupixent Syringe 300mg  injected into right thigh. Patient tolerated well.  LOT: [id number]   EXP: 08/12/2024 NDC: 0865-7846-96   Dorathy Daft, RMA

## 2023-01-18 ENCOUNTER — Ambulatory Visit: Payer: Medicare Other

## 2023-03-01 ENCOUNTER — Other Ambulatory Visit: Payer: Self-pay | Admitting: Student in an Organized Health Care Education/Training Program

## 2023-03-01 DIAGNOSIS — G894 Chronic pain syndrome: Secondary | ICD-10-CM

## 2023-03-07 ENCOUNTER — Other Ambulatory Visit: Payer: Self-pay | Admitting: Student in an Organized Health Care Education/Training Program

## 2023-03-07 DIAGNOSIS — G894 Chronic pain syndrome: Secondary | ICD-10-CM

## 2023-03-08 ENCOUNTER — Ambulatory Visit: Payer: Medicare Other | Attending: Internal Medicine

## 2023-03-08 ENCOUNTER — Ambulatory Visit: Payer: Medicare Other | Admitting: Occupational Therapy

## 2023-03-08 DIAGNOSIS — M79672 Pain in left foot: Secondary | ICD-10-CM | POA: Insufficient documentation

## 2023-03-08 DIAGNOSIS — M6281 Muscle weakness (generalized): Secondary | ICD-10-CM | POA: Insufficient documentation

## 2023-03-08 DIAGNOSIS — R262 Difficulty in walking, not elsewhere classified: Secondary | ICD-10-CM | POA: Insufficient documentation

## 2023-03-08 DIAGNOSIS — M5459 Other low back pain: Secondary | ICD-10-CM | POA: Insufficient documentation

## 2023-03-08 DIAGNOSIS — R600 Localized edema: Secondary | ICD-10-CM | POA: Insufficient documentation

## 2023-03-08 DIAGNOSIS — R269 Unspecified abnormalities of gait and mobility: Secondary | ICD-10-CM | POA: Insufficient documentation

## 2023-03-08 NOTE — Therapy (Deleted)
 OUTPATIENT PHYSICAL THERAPY NEURO EVALUATION   Patient Name: Suzanne Snyder MRN: [id number] DOB:03-06-1950, 73 y.o., female Today's Date: 03/08/2023   PCP: Gracelyn Nurse, MD  REFERRING PROVIDER:   Gracelyn Nurse, MD    END OF SESSION:   Past Medical History:  Diagnosis Date   Anxiety    Arthritis    Asthma    Diabetes mellitus without complication (HCC)    Insulin pump in place    Osteoporosis    RLS (restless legs syndrome)    Smokers' cough (HCC)    Wears dentures    full upper and lower   Past Surgical History:  Procedure Laterality Date   CARPAL TUNNEL RELEASE Left 11/20/2022   Procedure: CARPAL TUNNEL RELEASE;  Surgeon: Reinaldo Berber, MD;  Location: [facility];  Service: Orthopedics;  Laterality: Left;   CATARACT EXTRACTION W/PHACO Right 02/20/2020   Procedure: CATARACT EXTRACTION PHACO AND INTRAOCULAR LENS PLACEMENT (IOC) RIGHT 5.60 00:36.3;  Surgeon: Galen Manila, MD;  Location: [facility];  Service: Ophthalmology;  Laterality: Right;  Diabetic - insulin pump Requests not first   CATARACT EXTRACTION W/PHACO Left 03/05/2020   Procedure: CATARACT EXTRACTION PHACO AND INTRAOCULAR LENS PLACEMENT (IOC) LEFT 4.94 00:31.6;  Surgeon: Galen Manila, MD;  Location: [facility];  Service: Ophthalmology;  Laterality: Left;   HIP PINNING,CANNULATED Right 02/09/2022   Procedure: PERCUTANEOUS FIXATION OF FEMORAL NECK;  Surgeon: Deeann Saint, MD;  Location: [facility];  Service: Orthopedics;  Laterality: Right;   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   ROTATOR CUFF REPAIR Left 2014 and 2015   SPINE SURGERY N/A 1984   L4-5, not sure of procedure   THYROID SURGERY     Patient Active Problem List   Diagnosis Date Noted   Generalized anxiety disorder 11/29/2022   RLS (restless legs syndrome) 11/29/2022   Acute metabolic encephalopathy 11/19/2022   Pain and swelling of left wrist 11/19/2022   Fall 11/19/2022   Suppurative tenosynovitis of  flexor tendon of left hand 11/19/2022   Rib pain on left side 08/25/2022   SBO (small bowel obstruction) (HCC) 05/06/2022   Small bowel obstruction (HCC) 05/06/2022   Portal vein obstruction 05/06/2022   Right hip pain 02/14/2022   Hip pain, acute, right 02/13/2022   Electrolyte abnormality 02/13/2022   Anemia 02/13/2022   Malnutrition of moderate degree 02/10/2022   Closed right hip fracture (HCC) 02/08/2022   Hypomagnesemia 02/08/2022   Cellulitis of right ankle 02/08/2022   Hyponatremia 02/08/2022   Tobacco abuse 02/08/2022   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) 02/08/2022   Lumbar spondylosis 11/27/2021   Lumbar degenerative disc disease 11/27/2021   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/27/2021   Chronic left shoulder pain 11/27/2021   Cervical facet joint syndrome 11/27/2021   Cervical radicular pain 11/27/2021   Chronic pain syndrome 11/27/2021   Cellulitis of right wrist 12/21/2020   Cellulitis of right thigh 12/11/2020   Hypoglycemia 12/10/2020    ONSET DATE: ***  REFERRING DIAG:  Diagnosis  R26.81 (ICD-10-CM) - Gait instability    THERAPY DIAG:  No diagnosis found.  Rationale for Evaluation and Treatment: {HABREHAB:27488}  SUBJECTIVE:  SUBJECTIVE STATEMENT: Recently in ED 12/10/22 with low blood sugar, reported at time she had not been eating much. Pt accompanied by: {accompnied:27141}  PERTINENT HISTORY:  PMH anxiety, arthritis, DM, osteoporosis RLS, hx L total hip 2007, RTC repair L 2014 and 2015, spine surgery 1984   PAIN:  Are you having pain? {OPRCPAIN:27236}  PRECAUTIONS: {Therapy precautions:24002}  RED FLAGS: {PT Red Flags:29287}   WEIGHT BEARING RESTRICTIONS: {Yes ***/No:24003}  FALLS: Has patient fallen in last 6 months?  {fallsyesno:27318}  LIVING ENVIRONMENT: Lives with: {OPRC lives with:25569::"lives with their family"} Lives in: {Lives in:25570} Stairs: {opstairs:27293} Has following equipment at home: {Assistive devices:23999}  PLOF: {PLOF:24004}  PATIENT GOALS: ***  OBJECTIVE:  Note: Objective measures were completed at Evaluation unless otherwise noted.  DIAGNOSTIC FINDINGS:   CT cervical spine   IMPRESSION: Moderate to advanced degenerative disc and facet disease.   No acute bony abnormality.    COGNITION: Overall cognitive status: {cognition:24006}   SENSATION: {sensation:27233}  COORDINATION: ***  EDEMA:  {edema:24020}  MUSCLE TONE: {LE tone:25568}  MUSCLE LENGTH: Hamstrings: Right *** deg; Left *** deg Maisie Fus test: Right *** deg; Left *** deg  DTRs:  {DTR SITE:24025}  POSTURE: {posture:25561}  LOWER EXTREMITY ROM:     {AROM/PROM:27142}  Right Eval Left Eval  Hip flexion    Hip extension    Hip abduction    Hip adduction    Hip internal rotation    Hip external rotation    Knee flexion    Knee extension    Ankle dorsiflexion    Ankle plantarflexion    Ankle inversion    Ankle eversion     (Blank rows = not tested)  LOWER EXTREMITY MMT:    MMT Right Eval Left Eval  Hip flexion    Hip extension    Hip abduction    Hip adduction    Hip internal rotation    Hip external rotation    Knee flexion    Knee extension    Ankle dorsiflexion    Ankle plantarflexion    Ankle inversion    Ankle eversion    (Blank rows = not tested)  BED MOBILITY:  {Bed mobility:24027}  TRANSFERS: Assistive device utilized: {Assistive devices:23999}  Sit to stand: {Levels of assistance:24026} Stand to sit: {Levels of assistance:24026} Chair to chair: {Levels of assistance:24026} Floor: {Levels of assistance:24026}  RAMP:  Level of Assistance: {Levels of assistance:24026} Assistive device utilized: {Assistive devices:23999} Ramp Comments: ***  CURB:  Level  of Assistance: {Levels of assistance:24026} Assistive device utilized: {Assistive devices:23999} Curb Comments: ***  STAIRS: Level of Assistance: {Levels of assistance:24026} Stair Negotiation Technique: {Stair Technique:27161} with {Rail Assistance:27162} Number of Stairs: ***  Height of Stairs: ***  Comments: ***  GAIT: Gait pattern: {gait characteristics:25376} Distance walked: *** Assistive device utilized: {Assistive devices:23999} Level of assistance: {Levels of assistance:24026} Comments: ***  FUNCTIONAL TESTS:  {Functional tests:24029}  PATIENT SURVEYS:  {rehab surveys:24030}  TREATMENT DATE: ***    PATIENT EDUCATION: Education details: *** Person educated: {Person educated:25204} Education method: {Education Method:25205} Education comprehension: {Education Comprehension:25206}  HOME EXERCISE PROGRAM: ***  GOALS: Goals reviewed with patient? {yes/no:20286}  SHORT TERM GOALS: Target date: ***  *** Baseline: Goal status: INITIAL  2.  *** Baseline:  Goal status: INITIAL  3.  *** Baseline:  Goal status: INITIAL  4.  *** Baseline:  Goal status: INITIAL  5.  *** Baseline:  Goal status: INITIAL  6.  *** Baseline:  Goal status: INITIAL  LONG TERM GOALS: Target date: ***  *** Baseline:  Goal status: INITIAL  2.  *** Baseline:  Goal status: INITIAL  3.  *** Baseline:  Goal status: INITIAL  4.  *** Baseline:  Goal status: INITIAL  5.  *** Baseline:  Goal status: INITIAL  6.  *** Baseline:  Goal status: INITIAL  ASSESSMENT:  CLINICAL IMPRESSION: Patient is a *** y.o. *** who was seen today for physical therapy evaluation and treatment for ***.   OBJECTIVE IMPAIRMENTS: {opptimpairments:25111}.   ACTIVITY LIMITATIONS: {activitylimitations:27494}  PARTICIPATION LIMITATIONS:  {participationrestrictions:25113}  PERSONAL FACTORS: {Personal factors:25162} are also affecting patient's functional outcome.   REHAB POTENTIAL: {rehabpotential:25112}  CLINICAL DECISION MAKING: {clinical decision making:25114}  EVALUATION COMPLEXITY: {Evaluation complexity:25115}  PLAN:  PT FREQUENCY: {rehab frequency:25116}  PT DURATION: {rehab duration:25117}  PLANNED INTERVENTIONS: {rehab planned interventions:25118::"97110-Therapeutic exercises","97530- Therapeutic [id number]- Neuromuscular re-education","97535- Self JXBJ","47829- Manual therapy"}  PLAN FOR NEXT SESSION: Baird Kay, PT 03/08/2023, 8:46 AM

## 2023-03-09 ENCOUNTER — Ambulatory Visit: Payer: Medicare Other | Admitting: Occupational Therapy

## 2023-03-10 ENCOUNTER — Ambulatory Visit: Payer: Medicare Other

## 2023-03-10 NOTE — Therapy (Incomplete)
OUTPATIENT PHYSICAL THERAPY NEURO EVALUATION   Patient Name: Suzanne Snyder MRN: [id number] DOB:12-Oct-1950, 73 y.o., female Today's Date: 03/10/2023   PCP: Gracelyn Nurse, MD  REFERRING PROVIDER:   Gracelyn Nurse, MD    END OF SESSION:   Past Medical History:  Diagnosis Date   Anxiety    Arthritis    Asthma    Diabetes mellitus without complication (HCC)    Insulin pump in place    Osteoporosis    RLS (restless legs syndrome)    Smokers' cough (HCC)    Wears dentures    full upper and lower   Past Surgical History:  Procedure Laterality Date   CARPAL TUNNEL RELEASE Left 11/20/2022   Procedure: CARPAL TUNNEL RELEASE;  Surgeon: Reinaldo Berber, MD;  Location: [facility];  Service: Orthopedics;  Laterality: Left;   CATARACT EXTRACTION W/PHACO Right 02/20/2020   Procedure: CATARACT EXTRACTION PHACO AND INTRAOCULAR LENS PLACEMENT (IOC) RIGHT 5.60 00:36.3;  Surgeon: Galen Manila, MD;  Location: [facility];  Service: Ophthalmology;  Laterality: Right;  Diabetic - insulin pump Requests not first   CATARACT EXTRACTION W/PHACO Left 03/05/2020   Procedure: CATARACT EXTRACTION PHACO AND INTRAOCULAR LENS PLACEMENT (IOC) LEFT 4.94 00:31.6;  Surgeon: Galen Manila, MD;  Location: [facility];  Service: Ophthalmology;  Laterality: Left;   HIP PINNING,CANNULATED Right 02/09/2022   Procedure: PERCUTANEOUS FIXATION OF FEMORAL NECK;  Surgeon: Deeann Saint, MD;  Location: [facility];  Service: Orthopedics;  Laterality: Right;   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   ROTATOR CUFF REPAIR Left 2014 and 2015   SPINE SURGERY N/A 1984   L4-5, not sure of procedure   THYROID SURGERY     Patient Active Problem List   Diagnosis Date Noted   Generalized anxiety disorder 11/29/2022   RLS (restless legs syndrome) 11/29/2022   Acute metabolic encephalopathy 11/19/2022   Pain and swelling of left wrist 11/19/2022   Fall 11/19/2022   Suppurative tenosynovitis of  flexor tendon of left hand 11/19/2022   Rib pain on left side 08/25/2022   SBO (small bowel obstruction) (HCC) 05/06/2022   Small bowel obstruction (HCC) 05/06/2022   Portal vein obstruction 05/06/2022   Right hip pain 02/14/2022   Hip pain, acute, right 02/13/2022   Electrolyte abnormality 02/13/2022   Anemia 02/13/2022   Malnutrition of moderate degree 02/10/2022   Closed right hip fracture (HCC) 02/08/2022   Hypomagnesemia 02/08/2022   Cellulitis of right ankle 02/08/2022   Hyponatremia 02/08/2022   Tobacco abuse 02/08/2022   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) 02/08/2022   Lumbar spondylosis 11/27/2021   Lumbar degenerative disc disease 11/27/2021   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/27/2021   Chronic left shoulder pain 11/27/2021   Cervical facet joint syndrome 11/27/2021   Cervical radicular pain 11/27/2021   Chronic pain syndrome 11/27/2021   Cellulitis of right wrist 12/21/2020   Cellulitis of right thigh 12/11/2020   Hypoglycemia 12/10/2020    ONSET DATE: ***  REFERRING DIAG:  Diagnosis  R26.81 (ICD-10-CM) - Gait instability    THERAPY DIAG:  No diagnosis found.  Rationale for Evaluation and Treatment: {HABREHAB:27488}  SUBJECTIVE:  SUBJECTIVE STATEMENT: Recently in ED 12/10/22 with low blood sugar, reported at time she had not been eating much. Pt accompanied by: {accompnied:27141}  PERTINENT HISTORY:  PMH anxiety, arthritis, DM, osteoporosis RLS, hx L total hip 2007, RTC repair L 2014 and 2015, spine surgery 1984   PAIN:  Are you having pain? {OPRCPAIN:27236}  PRECAUTIONS: {Therapy precautions:24002}  RED FLAGS: {PT Red Flags:29287}   WEIGHT BEARING RESTRICTIONS: {Yes ***/No:24003}  FALLS: Has patient fallen in last 6 months?  {fallsyesno:27318}  LIVING ENVIRONMENT: Lives with: {OPRC lives with:25569::"lives with their family"} Lives in: {Lives in:25570} Stairs: {opstairs:27293} Has following equipment at home: {Assistive devices:23999}  PLOF: {PLOF:24004}  PATIENT GOALS: ***  OBJECTIVE:  Note: Objective measures were completed at Evaluation unless otherwise noted.  DIAGNOSTIC FINDINGS:   CT cervical spine   IMPRESSION: Moderate to advanced degenerative disc and facet disease.   No acute bony abnormality.    COGNITION: Overall cognitive status: {cognition:24006}   SENSATION: {sensation:27233}  COORDINATION: ***  EDEMA:  {edema:24020}  MUSCLE TONE: {LE tone:25568}  MUSCLE LENGTH: Hamstrings: Right *** deg; Left *** deg Maisie Fus test: Right *** deg; Left *** deg  DTRs:  {DTR SITE:24025}  POSTURE: {posture:25561}  LOWER EXTREMITY ROM:     {AROM/PROM:27142}  Right Eval Left Eval  Hip flexion    Hip extension    Hip abduction    Hip adduction    Hip internal rotation    Hip external rotation    Knee flexion    Knee extension    Ankle dorsiflexion    Ankle plantarflexion    Ankle inversion    Ankle eversion     (Blank rows = not tested)  LOWER EXTREMITY MMT:    MMT Right Eval Left Eval  Hip flexion    Hip extension    Hip abduction    Hip adduction    Hip internal rotation    Hip external rotation    Knee flexion    Knee extension    Ankle dorsiflexion    Ankle plantarflexion    Ankle inversion    Ankle eversion    (Blank rows = not tested)  BED MOBILITY:  {Bed mobility:24027}  TRANSFERS: Assistive device utilized: {Assistive devices:23999}  Sit to stand: {Levels of assistance:24026} Stand to sit: {Levels of assistance:24026} Chair to chair: {Levels of assistance:24026} Floor: {Levels of assistance:24026}  RAMP:  Level of Assistance: {Levels of assistance:24026} Assistive device utilized: {Assistive devices:23999} Ramp Comments: ***  CURB:  Level  of Assistance: {Levels of assistance:24026} Assistive device utilized: {Assistive devices:23999} Curb Comments: ***  STAIRS: Level of Assistance: {Levels of assistance:24026} Stair Negotiation Technique: {Stair Technique:27161} with {Rail Assistance:27162} Number of Stairs: ***  Height of Stairs: ***  Comments: ***  GAIT: Gait pattern: {gait characteristics:25376} Distance walked: *** Assistive device utilized: {Assistive devices:23999} Level of assistance: {Levels of assistance:24026} Comments: ***  FUNCTIONAL TESTS:  {Functional tests:24029}  PATIENT SURVEYS:  {rehab surveys:24030}  TREATMENT DATE: ***    PATIENT EDUCATION: Education details: *** Person educated: {Person educated:25204} Education method: {Education Method:25205} Education comprehension: {Education Comprehension:25206}  HOME EXERCISE PROGRAM: ***  GOALS: Goals reviewed with patient? {yes/no:20286}  SHORT TERM GOALS: Target date: ***  *** Baseline: Goal status: INITIAL  2.  *** Baseline:  Goal status: INITIAL  3.  *** Baseline:  Goal status: INITIAL  4.  *** Baseline:  Goal status: INITIAL  5.  *** Baseline:  Goal status: INITIAL  6.  *** Baseline:  Goal status: INITIAL  LONG TERM GOALS: Target date: ***  *** Baseline:  Goal status: INITIAL  2.  *** Baseline:  Goal status: INITIAL  3.  *** Baseline:  Goal status: INITIAL  4.  *** Baseline:  Goal status: INITIAL  5.  *** Baseline:  Goal status: INITIAL  6.  *** Baseline:  Goal status: INITIAL  ASSESSMENT:  CLINICAL IMPRESSION: Patient is a *** y.o. *** who was seen today for physical therapy evaluation and treatment for ***.   OBJECTIVE IMPAIRMENTS: {opptimpairments:25111}.   ACTIVITY LIMITATIONS: {activitylimitations:27494}  PARTICIPATION LIMITATIONS:  {participationrestrictions:25113}  PERSONAL FACTORS: {Personal factors:25162} are also affecting patient's functional outcome.   REHAB POTENTIAL: {rehabpotential:25112}  CLINICAL DECISION MAKING: {clinical decision making:25114}  EVALUATION COMPLEXITY: {Evaluation complexity:25115}  PLAN:  PT FREQUENCY: {rehab frequency:25116}  PT DURATION: {rehab duration:25117}  PLANNED INTERVENTIONS: {rehab planned interventions:25118::"97110-Therapeutic exercises","97530- Therapeutic [phone]- Neuromuscular re-education","97535- Self JXBJ","47829- Manual therapy"}  PLAN FOR NEXT SESSION: Baird Kay, PT 03/10/2023, 8:36 AM

## 2023-03-11 ENCOUNTER — Other Ambulatory Visit: Payer: Self-pay

## 2023-03-11 ENCOUNTER — Ambulatory Visit: Payer: Medicare Other | Admitting: Physical Therapy

## 2023-03-11 ENCOUNTER — Encounter: Payer: Self-pay | Admitting: Physical Therapy

## 2023-03-11 DIAGNOSIS — M5459 Other low back pain: Secondary | ICD-10-CM | POA: Diagnosis present

## 2023-03-11 DIAGNOSIS — M79672 Pain in left foot: Secondary | ICD-10-CM

## 2023-03-11 DIAGNOSIS — R262 Difficulty in walking, not elsewhere classified: Secondary | ICD-10-CM

## 2023-03-11 DIAGNOSIS — R269 Unspecified abnormalities of gait and mobility: Secondary | ICD-10-CM | POA: Diagnosis present

## 2023-03-11 DIAGNOSIS — R600 Localized edema: Secondary | ICD-10-CM | POA: Diagnosis present

## 2023-03-11 DIAGNOSIS — M6281 Muscle weakness (generalized): Secondary | ICD-10-CM | POA: Diagnosis present

## 2023-03-11 NOTE — Therapy (Unsigned)
 OUTPATIENT PHYSICAL THERAPY NEURO EVALUATION   Patient Name: Suzanne Snyder MRN: [id number] DOB:[birth year], 73 y.o., female Today's Date: 03/11/2023   PCP: Gracelyn Nurse, MD  REFERRING PROVIDER:   Gracelyn Nurse, MD    END OF SESSION:  PT End of Session - 03/11/23 1329     Visit Number 1    Number of Visits 24    Date for PT Re-Evaluation 06/03/23    Authorization Type --    PT Start Time 1329    PT Stop Time 1405    PT Time Calculation (min) 36 min    Equipment Utilized During Treatment Gait belt    Activity Tolerance Patient tolerated treatment well    Behavior During Therapy WFL for tasks assessed/performed             Past Medical History:  Diagnosis Date   Anxiety    Arthritis    Asthma    Diabetes mellitus without complication (HCC)    Insulin pump in place    Osteoporosis    RLS (restless legs syndrome)    Smokers' cough (HCC)    Wears dentures    full upper and lower   Past Surgical History:  Procedure Laterality Date   CARPAL TUNNEL RELEASE Left 11/20/2022   Procedure: CARPAL TUNNEL RELEASE;  Surgeon: Reinaldo Berber, MD;  Location: [facility];  Service: Orthopedics;  Laterality: Left;   CATARACT EXTRACTION W/PHACO Right 02/20/2020   Procedure: CATARACT EXTRACTION PHACO AND INTRAOCULAR LENS PLACEMENT (IOC) RIGHT 5.60 00:36.3;  Surgeon: Galen Manila, MD;  Location: [facility];  Service: Ophthalmology;  Laterality: Right;  Diabetic - insulin pump Requests not first   CATARACT EXTRACTION W/PHACO Left 03/05/2020   Procedure: CATARACT EXTRACTION PHACO AND INTRAOCULAR LENS PLACEMENT (IOC) LEFT 4.94 00:31.6;  Surgeon: Galen Manila, MD;  Location: [facility];  Service: Ophthalmology;  Laterality: Left;   HIP PINNING,CANNULATED Right 02/09/2022   Procedure: PERCUTANEOUS FIXATION OF FEMORAL NECK;  Surgeon: Deeann Saint, MD;  Location: [facility];  Service: Orthopedics;  Laterality: Right;   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    ROTATOR CUFF REPAIR Left 2014 and 2015   SPINE SURGERY N/A 1984   L4-5, not sure of procedure   THYROID SURGERY     Patient Active Problem List   Diagnosis Date Noted   Generalized anxiety disorder 11/29/2022   RLS (restless legs syndrome) 11/29/2022   Acute metabolic encephalopathy 11/19/2022   Pain and swelling of left wrist 11/19/2022   Fall 11/19/2022   Suppurative tenosynovitis of flexor tendon of left hand 11/19/2022   Rib pain on left side 08/25/2022   SBO (small bowel obstruction) (HCC) 05/06/2022   Small bowel obstruction (HCC) 05/06/2022   Portal vein obstruction 05/06/2022   Right hip pain 02/14/2022   Hip pain, acute, right 02/13/2022   Electrolyte abnormality 02/13/2022   Anemia 02/13/2022   Malnutrition of moderate degree 02/10/2022   Closed right hip fracture (HCC) 02/08/2022   Hypomagnesemia 02/08/2022   Cellulitis of right ankle 02/08/2022   Hyponatremia 02/08/2022   Tobacco abuse 02/08/2022   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) 02/08/2022   Lumbar spondylosis 11/27/2021   Lumbar degenerative disc disease 11/27/2021   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/27/2021   Chronic left shoulder pain 11/27/2021   Cervical facet joint syndrome 11/27/2021   Cervical radicular pain 11/27/2021   Chronic pain syndrome 11/27/2021   Cellulitis of right wrist 12/21/2020   Cellulitis of right  thigh 12/11/2020   Hypoglycemia 12/10/2020    ONSET DATE: >1year   REFERRING DIAG:  Diagnosis  R26.81 (ICD-10-CM) - Gait instability    THERAPY DIAG:  No diagnosis found.  Rationale for Evaluation and Treatment: Rehabilitation  SUBJECTIVE:                                                                                                                                                                                             SUBJECTIVE STATEMENT: Recently in ED 12/10/22 with low blood sugar, reported at time she had not been eating  much. Pt reports that in 2023 sustained femur fracture. After not taking care of her self with husband was struggling with Dementia. She  States that she has been slow to get going and gets tired extremely quickly.  States that her lower back and hips hurt constantly when on her feet.  Reports significant pain in BLE from chronic fx and truama.   Pt accompanied by: self  PERTINENT HISTORY:  PMH anxiety, arthritis, DM, osteoporosis RLS, hx L total hip 2007, RTC repair L 2014 and 2015, spine surgery 1984   PAIN:  Are you having pain? Yes: NPRS scale: 7.5/10  Pain location: lower back and hips  Pain description: "vivid" constant   Aggravating factors: walking  Relieving factors: mild improvement with tramadol, has become less affective over the last year ot 2.   PRECAUTIONS: Fall  RED FLAGS: Compression fracture: Yes: spinal surgrey in 1983    WEIGHT BEARING RESTRICTIONS: No  FALLS: Has patient fallen in last 6 months? No and a few near falls   LIVING ENVIRONMENT: Lives with: lives alone Lives in: House/apartment Stairs: Yes: Internal: 14 steps; on right going up and External: 6 steps; on right going up Has following equipment at home: Wheelchair (manual) and walking sticks.   PLOF: Independent with basic ADLs and Independent with household mobility without device  PATIENT GOALS: be on feel for longer time. Less pain.   OBJECTIVE:  Note: Objective measures were completed at Evaluation unless otherwise noted.  DIAGNOSTIC FINDINGS:   CT cervical spine   IMPRESSION: Moderate to advanced degenerative disc and facet disease.   No acute bony abnormality.    COGNITION: Overall cognitive status: Within functional limits for tasks assessed   SENSATION: {sensation:27233}  COORDINATION: ***  EDEMA:  {edema:24020}  MUSCLE TONE: {LE tone:25568}  MUSCLE LENGTH: Hamstrings: Right *** deg; Left *** deg Maisie Fus test: Right *** deg; Left *** deg  DTRs:  {DTR  SITE:24025}  POSTURE: {posture:25561}  LOWER EXTREMITY ROM:     {AROM/PROM:27142}  Right Eval Left Eval  Hip flexion  Hip extension    Hip abduction    Hip adduction    Hip internal rotation    Hip external rotation    Knee flexion    Knee extension    Ankle dorsiflexion    Ankle plantarflexion    Ankle inversion    Ankle eversion     (Blank rows = not tested)  LOWER EXTREMITY MMT:    MMT Right Eval Left Eval  Hip flexion    Hip extension    Hip abduction    Hip adduction    Hip internal rotation    Hip external rotation    Knee flexion    Knee extension    Ankle dorsiflexion    Ankle plantarflexion    Ankle inversion    Ankle eversion    (Blank rows = not tested)  BED MOBILITY:  {Bed mobility:24027}  TRANSFERS: Assistive device utilized: {Assistive devices:23999}  Sit to stand: {Levels of assistance:24026} Stand to sit: {Levels of assistance:24026} Chair to chair: {Levels of assistance:24026} Floor: {Levels of assistance:24026}  RAMP:  Level of Assistance: {Levels of assistance:24026} Assistive device utilized: {Assistive devices:23999} Ramp Comments: ***  CURB:  Level of Assistance: {Levels of assistance:24026} Assistive device utilized: {Assistive devices:23999} Curb Comments: ***  STAIRS: Level of Assistance: {Levels of assistance:24026} Stair Negotiation Technique: {Stair Technique:27161} with {Rail Assistance:27162} Number of Stairs: ***  Height of Stairs: ***  Comments: ***  GAIT: Gait pattern: {gait characteristics:25376} Distance walked: *** Assistive device utilized: {Assistive devices:23999} Level of assistance: {Levels of assistance:24026} Comments: ***  FUNCTIONAL TESTS:  5 times sit to stand: 13.78 Timed up and go (TUG): 10.28sec  6 minute walk test: *** 10 meter walk test: 11.86, 13.21  Berg Balance Scale: *** Functional gait assessment: ***  PATIENT SURVEYS:  ABC scale ***                                                                                                                               TREATMENT DATE: 03/11/2023     PATIENT EDUCATION: Education details: *** Person educated: {Person educated:25204} Education method: {Education Method:25205} Education comprehension: {Education Comprehension:25206}  HOME EXERCISE PROGRAM: ***  GOALS: Goals reviewed with patient? {yes/no:20286}  SHORT TERM GOALS: Target date: ***  *** Baseline: Goal status: INITIAL  2.  *** Baseline:  Goal status: INITIAL  3.  *** Baseline:  Goal status: INITIAL  4.  *** Baseline:  Goal status: INITIAL  5.  *** Baseline:  Goal status: INITIAL  6.  *** Baseline:  Goal status: INITIAL  LONG TERM GOALS: Target date: ***  *** Baseline:  Goal status: INITIAL  2.  *** Baseline:  Goal status: INITIAL  3.  *** Baseline:  Goal status: INITIAL  4.  *** Baseline:  Goal status: INITIAL  5.  *** Baseline:  Goal status: INITIAL  6.  *** Baseline:  Goal status: INITIAL  ASSESSMENT:  CLINICAL IMPRESSION: Patient is a *** y.o. *** who was seen  today for physical therapy evaluation and treatment for ***.   OBJECTIVE IMPAIRMENTS: {opptimpairments:25111}.   ACTIVITY LIMITATIONS: {activitylimitations:27494}  PARTICIPATION LIMITATIONS: {participationrestrictions:25113}  PERSONAL FACTORS: {Personal factors:25162} are also affecting patient's functional outcome.   REHAB POTENTIAL: {rehabpotential:25112}  CLINICAL DECISION MAKING: {clinical decision making:25114}  EVALUATION COMPLEXITY: {Evaluation complexity:25115}  PLAN:  PT FREQUENCY: {rehab frequency:25116}  PT DURATION: {rehab duration:25117}  PLANNED INTERVENTIONS: {rehab planned interventions:25118::"97110-Therapeutic exercises","97530- Therapeutic [phone]- Neuromuscular re-education","97535- Self QION","62952- Manual therapy"}  PLAN FOR NEXT SESSION: ***   Golden Pop, PT 03/11/2023, 1:31 PM

## 2023-03-12 ENCOUNTER — Encounter: Payer: Self-pay | Admitting: Occupational Therapy

## 2023-03-12 ENCOUNTER — Ambulatory Visit: Payer: Medicare Other | Admitting: Occupational Therapy

## 2023-03-12 DIAGNOSIS — R269 Unspecified abnormalities of gait and mobility: Secondary | ICD-10-CM | POA: Diagnosis not present

## 2023-03-12 DIAGNOSIS — R600 Localized edema: Secondary | ICD-10-CM

## 2023-03-12 NOTE — Therapy (Signed)
 OUTPATIENT OCCUPATIONAL THERAPY ORTHO EVALUATION  Patient Name: Suzanne Snyder MRN: [id number] DOB:1950-04-20, 73 y.o., female Today's Date: 03/12/2023  PCP: Dr Iver Nestle PROVIDER: Dr Rosita Kea  END OF SESSION:  OT End of Session - 03/12/23 1351     Visit Number 1    Number of Visits 2    Date for OT Re-Evaluation 04/09/23    OT Start Time 1115    OT Stop Time 1149    OT Time Calculation (min) 34 min    Activity Tolerance Patient tolerated treatment well    Behavior During Therapy [facility] for tasks assessed/performed             Past Medical History:  Diagnosis Date   Anxiety    Arthritis    Asthma    Diabetes mellitus without complication (HCC)    Insulin pump in place    Osteoporosis    RLS (restless legs syndrome)    Smokers' cough (HCC)    Wears dentures    full upper and lower   Past Surgical History:  Procedure Laterality Date   CARPAL TUNNEL RELEASE Left 11/20/2022   Procedure: CARPAL TUNNEL RELEASE;  Surgeon: Reinaldo Berber, MD;  Location: [facility];  Service: Orthopedics;  Laterality: Left;   CATARACT EXTRACTION W/PHACO Right 02/20/2020   Procedure: CATARACT EXTRACTION PHACO AND INTRAOCULAR LENS PLACEMENT (IOC) RIGHT 5.60 00:36.3;  Surgeon: Galen Manila, MD;  Location: [facility];  Service: Ophthalmology;  Laterality: Right;  Diabetic - insulin pump Requests not first   CATARACT EXTRACTION W/PHACO Left 03/05/2020   Procedure: CATARACT EXTRACTION PHACO AND INTRAOCULAR LENS PLACEMENT (IOC) LEFT 4.94 00:31.6;  Surgeon: Galen Manila, MD;  Location: [facility];  Service: Ophthalmology;  Laterality: Left;   HIP PINNING,CANNULATED Right 02/09/2022   Procedure: PERCUTANEOUS FIXATION OF FEMORAL NECK;  Surgeon: Deeann Saint, MD;  Location: [facility];  Service: Orthopedics;  Laterality: Right;   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   ROTATOR CUFF REPAIR Left 2014 and 2015   SPINE SURGERY N/A 1984   L4-5, not sure of procedure    THYROID SURGERY     Patient Active Problem List   Diagnosis Date Noted   Generalized anxiety disorder 11/29/2022   RLS (restless legs syndrome) 11/29/2022   Acute metabolic encephalopathy 11/19/2022   Pain and swelling of left wrist 11/19/2022   Fall 11/19/2022   Suppurative tenosynovitis of flexor tendon of left hand 11/19/2022   Rib pain on left side 08/25/2022   SBO (small bowel obstruction) (HCC) 05/06/2022   Small bowel obstruction (HCC) 05/06/2022   Portal vein obstruction 05/06/2022   Right hip pain 02/14/2022   Hip pain, acute, right 02/13/2022   Electrolyte abnormality 02/13/2022   Anemia 02/13/2022   Malnutrition of moderate degree 02/10/2022   Closed right hip fracture (HCC) 02/08/2022   Hypomagnesemia 02/08/2022   Cellulitis of right ankle 02/08/2022   Hyponatremia 02/08/2022   Tobacco abuse 02/08/2022   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) 02/08/2022   Lumbar spondylosis 11/27/2021   Lumbar degenerative disc disease 11/27/2021   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/27/2021   Chronic left shoulder pain 11/27/2021   Cervical facet joint syndrome 11/27/2021   Cervical radicular pain 11/27/2021   Chronic pain syndrome 11/27/2021   Cellulitis of right wrist 12/21/2020   Cellulitis of right thigh 12/11/2020   Hypoglycemia 12/10/2020    ONSET DATE: 11/24  REFERRING DIAG: Suppurative tenosynovitis of flexor tendon L hand   THERAPY DIAG:  Localized edema  Rationale for Evaluation and Treatment: Rehabilitation  SUBJECTIVE:   SUBJECTIVE STATEMENT: I brought some gloves in for you to look at please.  Sometimes I get up in the morning my Lhand is so swollen. Pt accompanied by: self  PERTINENT HISTORY: Assessment Dr Rosita Kea 02/17/23 ICD-10-CM  1. Suppurative tenosynovitis of flexor tendon of left hand M65.142  2. 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 Chronic edema R60.9   Plan: The  patient has clinical findings of doing well status post left hand incision and drainage with carpal tunnel release.  She has demonstrated significant improvement overall, with excellent grip strength and full mobility. However, there is persistent erythema and discoloration due to a severe infection. She will be referred to [employer], for a compression garment for her hand and wrist, with the aim of alleviating her chronic edema.   PRECAUTIONS: None      WEIGHT BEARING RESTRICTIONS: No  PAIN:  Are you having pain? 1-2/10 pain - more stiff and stays dry  FALLS: Has patient fallen in last 6 months? No  LIVING ENVIRONMENT: Lives with: Lives alone  PLOF: Patient lives alone.  She makes jewelry and does her own housework and cooking.  Likes to read.  PATIENT GOALS: To know what compression to wear to prevent swelling and pain in L hand    OBJECTIVE:  Note: Objective measures were completed at Evaluation unless otherwise noted.  HAND DOMINANCE: Right  ADLs: Independent in all ADLs    UPPER EXTREMITY ROM:    Bilateral wrist active range of motion and forearm range of motion within normal limits. Strength for wrist 5 -/5, supination pronation 4+/5. Patient independent in composite flexion of digits.  As well as thumb palmar radial abduction within functional limits.  Opposition to base of fifth. HAND FUNCTION: Grip strength: Right: 48 lbs; Left: 55 lbs, Lateral pinch: Right: 9 lbs, Left: 11 lbs, and 3 point pinch: Right: 9 lbs, Left: 10 lbs  COORDINATION: WFL - able to make jewelry   SENSATION: Denies any numbness   EDEMA: None notice at eval - pt report at times when doing to much at home - hand is swollen in the am   COGNITION: Overall cognitive status: Within functional limits for tasks assessed      TREATMENT DATE: 03/12/23                                                                                                                             Reviewed with patient when she has increase pain or swelling at the end of the day to do contrast.  Handout provided. Patient present with 4 types of compression gloves that she ordered on [employer]. Assess fitting of open finger compression gloves.-2 types fitting very well.  Patient to wear it during the day some prior to wearing at nighttime.  She did not cause any pain or discomfort or increased stiffness after wearing  it. Patient cued to call dress them evenings at the end of the day if overdone activities. Compression glove with grip is can be used during the day.  Patient to continue at home for about 3 to 4 weeks     PATIENT EDUCATION: Education details: findings of eval and HEP /wearing of compression gloves Person educated: Patient Education method: Explanation, Demonstration, Tactile cues, Verbal cues, and Handouts Education comprehension: verbalized understanding, returned demonstration, verbal cues required, and needs further education    GOALS: Goals reviewed with patient? Yes LONG TERM GOALS: Target date: 4 wks  Patient to verbalize and demo understanding of wearing compression gloves correctly without causing any pain or discomfort or increased stiffness. Baseline: Patient brought all her gloves small.  Did recommend one of them to change to medium.  Upon measuring size of hand.  Goal status: Met  2.  Patient showed decreased edema in the morning and pain with use of compression. Baseline: Patient no knowledge of wearing compression.  Some mornings patient with increased edema and pain after using her hands the previous day. Goal status: INITIAL    ASSESSMENT:  CLINICAL IMPRESSION: Patient  seen today for occupational therapy evaluation for chronic edema in left hand after she had extended surgery for infection in the left hand end of last year.  Patient appear to have increased edema and pain in the mornings after using her hands the previous day.  Patient present  with active range of motion in left digits and thumb within normal limits.  Wrist active range of motion and strength within normal limits.  Grip and prehension strength within normal limits for age.  Patient was educated on doing contrast in the evenings after using her hands a lot if increased pain or edema.  Patient was fitted with correct compression gloves to be used as needed during the day and nighttime.  Patient can follow-up with me if needed after a month of using it otherwise we will discharge patient.  PERFORMANCE DEFICITS: in functional skills including ADLs, edema, ROM, and pain, cognitive skills including    IMPAIRMENTS: are limiting patient from ADLs, IADLs, and rest and sleep.   COMORBIDITIES: may have co-morbidities  that affects occupational performance. Patient will benefit from skilled OT to address above impairments and improve overall function.  MODIFICATION OR ASSISTANCE TO COMPLETE EVALUATION: No modification of tasks or assist necessary to complete an evaluation.  OT OCCUPATIONAL PROFILE AND HISTORY: Problem focused assessment: Including review of records relating to presenting problem.  CLINICAL DECISION MAKING: LOW - limited treatment options, no task modification necessary  REHAB POTENTIAL: Good  EVALUATION COMPLEXITY: Low      PLAN:  OT FREQUENCY: 1x/month  OT DURATION: 4 weeks  PLANNED INTERVENTIONS: 97535 self care/ADL training, 32440 therapeutic exercise, 97034 contrast bath, patient/family education, DME and/or AE instructions, and edema control    CONSULTED AND AGREED WITH PLAN OF CARE: Patient     Oletta Cohn, OTR/L,CLT 03/12/2023, 1:53 PM

## 2023-03-15 ENCOUNTER — Ambulatory Visit: Payer: Medicare Other | Attending: Internal Medicine

## 2023-03-15 ENCOUNTER — Telehealth: Payer: Self-pay | Admitting: Student in an Organized Health Care Education/Training Program

## 2023-03-15 DIAGNOSIS — M79672 Pain in left foot: Secondary | ICD-10-CM | POA: Insufficient documentation

## 2023-03-15 DIAGNOSIS — R262 Difficulty in walking, not elsewhere classified: Secondary | ICD-10-CM | POA: Insufficient documentation

## 2023-03-15 DIAGNOSIS — M6281 Muscle weakness (generalized): Secondary | ICD-10-CM | POA: Insufficient documentation

## 2023-03-15 DIAGNOSIS — M5459 Other low back pain: Secondary | ICD-10-CM | POA: Insufficient documentation

## 2023-03-15 DIAGNOSIS — R269 Unspecified abnormalities of gait and mobility: Secondary | ICD-10-CM | POA: Insufficient documentation

## 2023-03-15 NOTE — Telephone Encounter (Addendum)
 Patient called to get her tramadol refilled. Last visit was August of 2024. I gave her appt for 03-23-23 at 8am. She is out of meds now

## 2023-03-15 NOTE — Telephone Encounter (Signed)
 noted

## 2023-03-17 ENCOUNTER — Telehealth: Payer: Self-pay | Admitting: Physical Therapy

## 2023-03-17 ENCOUNTER — Ambulatory Visit: Payer: Medicare Other | Admitting: Physical Therapy

## 2023-03-17 NOTE — Telephone Encounter (Signed)
 Pt did not arrive for her scheduled physical therapy appointment. Attempted to reach patient via telephone and a voicemail was left with general message to notify patient of next visit time and to call in event of inability to attend future therapy appointments. Pt also made aware of missed visit policy after 2 no show visits.  Casimiro Needle, PT, DPT, NCS, CSRS Physical Therapist - [place]  [facility]  2:25 PM 03/17/23

## 2023-03-17 NOTE — Therapy (Deleted)
 OUTPATIENT PHYSICAL THERAPY NEURO TREATMENT   Patient Name: Suzanne Snyder MRN: [id number] DOB:1950/02/21, 73 y.o., female Today's Date: 03/17/2023   PCP: Gracelyn Nurse, MD  REFERRING PROVIDER:   Gracelyn Nurse, MD    END OF SESSION: ***    Past Medical History:  Diagnosis Date   Anxiety    Arthritis    Asthma    Diabetes mellitus without complication (HCC)    Insulin pump in place    Osteoporosis    RLS (restless legs syndrome)    Smokers' cough (HCC)    Wears dentures    full upper and lower   Past Surgical History:  Procedure Laterality Date   CARPAL TUNNEL RELEASE Left 11/20/2022   Procedure: CARPAL TUNNEL RELEASE;  Surgeon: Reinaldo Berber, MD;  Location: [facility];  Service: Orthopedics;  Laterality: Left;   CATARACT EXTRACTION W/PHACO Right 02/20/2020   Procedure: CATARACT EXTRACTION PHACO AND INTRAOCULAR LENS PLACEMENT (IOC) RIGHT 5.60 00:36.3;  Surgeon: Galen Manila, MD;  Location: [facility];  Service: Ophthalmology;  Laterality: Right;  Diabetic - insulin pump Requests not first   CATARACT EXTRACTION W/PHACO Left 03/05/2020   Procedure: CATARACT EXTRACTION PHACO AND INTRAOCULAR LENS PLACEMENT (IOC) LEFT 4.94 00:31.6;  Surgeon: Galen Manila, MD;  Location: [facility];  Service: Ophthalmology;  Laterality: Left;   HIP PINNING,CANNULATED Right 02/09/2022   Procedure: PERCUTANEOUS FIXATION OF FEMORAL NECK;  Surgeon: Deeann Saint, MD;  Location: [facility];  Service: Orthopedics;  Laterality: Right;   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   ROTATOR CUFF REPAIR Left 2014 and 2015   SPINE SURGERY N/A 1984   L4-5, not sure of procedure   THYROID SURGERY     Patient Active Problem List   Diagnosis Date Noted   Generalized anxiety disorder 11/29/2022   RLS (restless legs syndrome) 11/29/2022   Acute metabolic encephalopathy 11/19/2022   Pain and swelling of left wrist 11/19/2022   Fall 11/19/2022   Suppurative tenosynovitis of  flexor tendon of left hand 11/19/2022   Rib pain on left side 08/25/2022   SBO (small bowel obstruction) (HCC) 05/06/2022   Small bowel obstruction (HCC) 05/06/2022   Portal vein obstruction 05/06/2022   Right hip pain 02/14/2022   Hip pain, acute, right 02/13/2022   Electrolyte abnormality 02/13/2022   Anemia 02/13/2022   Malnutrition of moderate degree 02/10/2022   Closed right hip fracture (HCC) 02/08/2022   Hypomagnesemia 02/08/2022   Cellulitis of right ankle 02/08/2022   Hyponatremia 02/08/2022   Tobacco abuse 02/08/2022   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) 02/08/2022   Lumbar spondylosis 11/27/2021   Lumbar degenerative disc disease 11/27/2021   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/27/2021   Chronic left shoulder pain 11/27/2021   Cervical facet joint syndrome 11/27/2021   Cervical radicular pain 11/27/2021   Chronic pain syndrome 11/27/2021   Cellulitis of right wrist 12/21/2020   Cellulitis of right thigh 12/11/2020   Hypoglycemia 12/10/2020    ONSET DATE: >1year   REFERRING DIAG:  Diagnosis  R26.81 (ICD-10-CM) - Gait instability    THERAPY DIAG: *** No diagnosis found.  Rationale for Evaluation and Treatment: Rehabilitation  SUBJECTIVE:  SUBJECTIVE STATEMENT: *** Verbose and tangential Low BS in past    Pt accompanied by: self  PERTINENT HISTORY:  PMH anxiety, arthritis, DM, osteoporosis RLS, hx L total hip 2007, RTC repair L 2014 and 2015, spine surgery 1984. Pt arrived late to scheduled evaluation and is extremely verbose and tangential throughout evaluation.  Recently in ED 12/10/22 with low blood sugar, reported at time she had not been eating much. Pt reports that in 2023 sustained femur fracture, After not taking care of her  self while caring for husband that was struggling with Dementia. She States that she has been slow to get going and gets tired extremely quickly. reports that her lower back and hips hurt constantly when on her feet.  Reports significant pain in BLE from chronic fx and truama.   PAIN:  Are you having pain? Yes: NPRS scale: 7.5/10  Pain location: lower back and hips  Pain description: "vivid" constant   Aggravating factors: walking  Relieving factors: mild improvement with tramadol, has become less affective over the last year ot 2.   PRECAUTIONS: Fall  RED FLAGS: Compression fracture: Yes: spinal surgrey in 1983    WEIGHT BEARING RESTRICTIONS: No  FALLS: Has patient fallen in last 6 months? No and a few near falls   LIVING ENVIRONMENT: Lives with: lives alone Lives in: House/apartment Stairs: Yes: Internal: 14 steps; on right going up and External: 6 steps; on right going up Has following equipment at home: Wheelchair (manual) and walking sticks.   PLOF: Independent with basic ADLs and Independent with household mobility without device  PATIENT GOALS: be on feel for longer time. Less pain.   OBJECTIVE:  Note: Objective measures were completed at Evaluation unless otherwise noted.  DIAGNOSTIC FINDINGS:   CT cervical spine   IMPRESSION: Moderate to advanced degenerative disc and facet disease.   No acute bony abnormality.    COGNITION: Overall cognitive status: Within functional limits for tasks assessed   SENSATION: WFL  COORDINATION: WFL with testing. Mild staggering of gait.   EDEMA:  Mild edema in Bil hands and L   MUSCLE TONE: WFL    POSTURE: rounded shoulders, forward head, and decreased lumbar lordosis  LOWER EXTREMITY ROM:     Grossly WFL.  Limited trunk and lumbar extension to 5 deg.  Trunk flexion WFL.  Mild instability with trunk rotation, but grossly WFL ROM.    LOWER EXTREMITY MMT:    MMT Right Eval Left Eval  Hip flexion 4+ 4  Hip  extension 4 4  Hip abduction 4+ 4+  Hip adduction 5 5  Hip internal rotation    Hip external rotation    Knee flexion 4+ 4+  Knee extension 5 5  Ankle dorsiflexion 4+ 4  Ankle plantarflexion    Ankle inversion    Ankle eversion    (Blank rows = not tested)  BED MOBILITY:  Sit to supine Complete Independence Supine to sit Complete Independence  TRANSFERS: Assistive device utilized: None  Sit to stand: SBA Stand to sit: SBA Chair to chair: SBA Floor:  not assessed     CURB:  Level of Assistance: CGA Assistive device utilized:  rail Curb Comments:   STAIRS: Level of Assistance: CGA Stair Negotiation Technique: Step to Pattern with Bilateral Rails Number of Stairs: 4  Height of Stairs: 6  Comments:   GAIT: Gait pattern:  occasional staggering, step through pattern, and narrow BOS Distance walked: 60 Assistive device utilized: None Level of assistance: SBA Comments: staggering in  turn and on first bout of  FUNCTIONAL TESTS:  5 times sit to stand: 13.78 Timed up and go (TUG): 10.28sec  6 minute walk test: To be completed  10 meter walk test: 11.86, 13.21sec (0.80m/s) Berg Balance Scale: to be completed  Functional gait assessment: to be completed   PATIENT SURVEYS:  ABC scale 93.375                                                                                                                              TREATMENT DATE: 03/17/2023  ***  Patient participated in [place] Balance Test and demonstrates increased fall risk as noted by score of   ***/56.  (<36= high risk for falls, close to 100%; 37-45 significant >80%; 46-51 moderate >50%; 52-55 lower >25%).  Pt participated in Functional Gait Assessment (FGA) with score of ***/30 demonstrating *** fall risk (low fall risk 25-28, medium fall risk 19-24, and high fall risk <19).   PATIENT EDUCATION: Education details: POC Person educated: Patient Education method: Explanation Education comprehension:  verbalized understanding  HOME EXERCISE PROGRAM: To be given at next session  GOALS: Goals reviewed with patient? Yes   SHORT TERM GOALS: Target date: 04/09/2023   Patient will be independent in home exercise program to improve strength/mobility for better functional independence with ADLs. Baseline: to be given on visit 2  Goal status: INITIAL   LONG TERM GOALS: Target date: 06/04/2023    Patient will increase ABC score  by equal to or greater 3points   to demonstrate statistically significant improvement in mobility and quality of life.  Baseline: 93.375% Goal status: INITIAL  2.  Patient (> 23 years old) will complete five times sit to stand test in < 12 seconds indicating an increased LE strength and improved balance. Baseline: 13.78 Goal status: INITIAL  3.  Patient will increase Berg Balance score by > 6 points to demonstrate decreased fall risk during functional activities Baseline: to be completed  Goal status: INITIAL  4.  Patient will increase 10 meter walk test to >1.68m/s as to improve gait speed for better community ambulation and to reduce fall risk. Baseline: 0.32m/s Goal status: INITIAL  5.  Patient will increase 6 min walk test  by 157ft to demonstrate improved balance and tolerance to standing, allowing improved community access and independence.  Baseline: to be completed  Goal status: INITIAL  6.  Patient will increase FGA score to >/ 24 as to demonstrate reduced fall risk and improved dynamic gait balance for better safety with community/home ambulation.   Baseline: to be completed  Goal status: INITIAL   ASSESSMENT:  CLINICAL IMPRESSION: Patient is a 72y.o. female who was seen today for physical therapy treatment for *** impaired balance, B LE weakness, impaired endurance, impaired motor planning, and gait deficits due to ***.   ***  Will benefit from PT to address strength and balance deficits as well as address pain in low back to improve  overall QoL.  OBJECTIVE IMPAIRMENTS: Abnormal gait, cardiopulmonary status limiting activity, decreased activity tolerance, decreased balance, decreased coordination, decreased endurance, decreased knowledge of use of DME, decreased mobility, difficulty walking, decreased strength, decreased safety awareness, hypomobility, increased fascial restrictions, and impaired flexibility.   ACTIVITY LIMITATIONS: carrying, lifting, bending, sitting, stairs, and locomotion level  PARTICIPATION LIMITATIONS: cleaning, laundry, driving, shopping, and community activity  PERSONAL FACTORS: Age and 1 comorbidity: hx of back fx and surgery   are also affecting patient's functional outcome.   REHAB POTENTIAL: Good  CLINICAL DECISION MAKING: Stable/uncomplicated  EVALUATION COMPLEXITY: Moderate  PLAN:  PT FREQUENCY: 1-2x/week  PT DURATION: 12 weeks  PLANNED INTERVENTIONS: 97110-Therapeutic exercises, 97530- Therapeutic activity, [id number]- Neuromuscular re-education, 97535- Self Care, [id number]- Manual therapy, [phone]- Gait training, [phone]- Aquatic Therapy, Patient/Family education, Balance training, Stair training, Taping, Dry Needling, Joint mobilization, Joint manipulation, DME instructions, Cryotherapy, and Moist heat  PLAN FOR NEXT SESSION:  *** Complete 6 min walk test, FGA and provide initial HEP.    Casimiro Needle, PT, DPT, NCS, CSRS Physical Therapist - [place]  [facility]  8:22 AM 03/17/23

## 2023-03-22 ENCOUNTER — Ambulatory Visit: Payer: Medicare Other

## 2023-03-23 ENCOUNTER — Encounter: Admitting: Student in an Organized Health Care Education/Training Program

## 2023-03-29 ENCOUNTER — Ambulatory Visit: Payer: Medicare Other | Admitting: Physical Therapy

## 2023-03-29 DIAGNOSIS — R269 Unspecified abnormalities of gait and mobility: Secondary | ICD-10-CM

## 2023-03-29 DIAGNOSIS — M5459 Other low back pain: Secondary | ICD-10-CM | POA: Diagnosis present

## 2023-03-29 DIAGNOSIS — M6281 Muscle weakness (generalized): Secondary | ICD-10-CM | POA: Diagnosis present

## 2023-03-29 DIAGNOSIS — M79672 Pain in left foot: Secondary | ICD-10-CM

## 2023-03-29 DIAGNOSIS — R262 Difficulty in walking, not elsewhere classified: Secondary | ICD-10-CM

## 2023-03-29 NOTE — Therapy (Signed)
 OUTPATIENT PHYSICAL THERAPY NEURO TREATMENT   Patient Name: JAMIRACLE AVANTS MRN: [id number] DOB:1950/02/04, 73 y.o., female Today's Date: 03/29/2023   PCP: Gracelyn Nurse, MD  REFERRING PROVIDER:   Gracelyn Nurse, MD    END OF SESSION:   PT End of Session - 03/29/23 1617     Visit Number 2    Number of Visits 24    Date for PT Re-Evaluation 06/03/23    PT Start Time 1619    PT Stop Time 1710    PT Time Calculation (min) 51 min    Equipment Utilized During Treatment Gait belt    Activity Tolerance Patient tolerated treatment well    Behavior During Therapy WFL for tasks assessed/performed              Past Medical History:  Diagnosis Date   Anxiety    Arthritis    Asthma    Diabetes mellitus without complication (HCC)    Insulin pump in place    Osteoporosis    RLS (restless legs syndrome)    Smokers' cough (HCC)    Wears dentures    full upper and lower   Past Surgical History:  Procedure Laterality Date   CARPAL TUNNEL RELEASE Left 11/20/2022   Procedure: CARPAL TUNNEL RELEASE;  Surgeon: Reinaldo Berber, MD;  Location: [facility];  Service: Orthopedics;  Laterality: Left;   CATARACT EXTRACTION W/PHACO Right 02/20/2020   Procedure: CATARACT EXTRACTION PHACO AND INTRAOCULAR LENS PLACEMENT (IOC) RIGHT 5.60 00:36.3;  Surgeon: Galen Manila, MD;  Location: [facility];  Service: Ophthalmology;  Laterality: Right;  Diabetic - insulin pump Requests not first   CATARACT EXTRACTION W/PHACO Left 03/05/2020   Procedure: CATARACT EXTRACTION PHACO AND INTRAOCULAR LENS PLACEMENT (IOC) LEFT 4.94 00:31.6;  Surgeon: Galen Manila, MD;  Location: [facility];  Service: Ophthalmology;  Laterality: Left;   HIP PINNING,CANNULATED Right 02/09/2022   Procedure: PERCUTANEOUS FIXATION OF FEMORAL NECK;  Surgeon: Deeann Saint, MD;  Location: [facility];  Service: Orthopedics;  Laterality: Right;   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   ROTATOR CUFF REPAIR  Left 2014 and 2015   SPINE SURGERY N/A 1984   L4-5, not sure of procedure   THYROID SURGERY     Patient Active Problem List   Diagnosis Date Noted   Generalized anxiety disorder 11/29/2022   RLS (restless legs syndrome) 11/29/2022   Acute metabolic encephalopathy 11/19/2022   Pain and swelling of left wrist 11/19/2022   Fall 11/19/2022   Suppurative tenosynovitis of flexor tendon of left hand 11/19/2022   Rib pain on left side 08/25/2022   SBO (small bowel obstruction) (HCC) 05/06/2022   Small bowel obstruction (HCC) 05/06/2022   Portal vein obstruction 05/06/2022   Right hip pain 02/14/2022   Hip pain, acute, right 02/13/2022   Electrolyte abnormality 02/13/2022   Anemia 02/13/2022   Malnutrition of moderate degree 02/10/2022   Closed right hip fracture (HCC) 02/08/2022   Hypomagnesemia 02/08/2022   Cellulitis of right ankle 02/08/2022   Hyponatremia 02/08/2022   Tobacco abuse 02/08/2022   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) 02/08/2022   Lumbar spondylosis 11/27/2021   Lumbar degenerative disc disease 11/27/2021   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/27/2021   Chronic left shoulder pain 11/27/2021   Cervical facet joint syndrome 11/27/2021   Cervical radicular pain 11/27/2021   Chronic pain syndrome 11/27/2021   Cellulitis of right wrist 12/21/2020   Cellulitis of right thigh 12/11/2020  Hypoglycemia 12/10/2020    ONSET DATE: >1year   REFERRING DIAG:  Diagnosis  R26.81 (ICD-10-CM) - Gait instability    THERAPY DIAG:  Abnormality of gait and mobility  Muscle weakness (generalized)  Difficulty in walking, not elsewhere classified  Pain in left foot  Other low back pain  Rationale for Evaluation and Treatment: Rehabilitation  SUBJECTIVE:                                                                                                                                                                                              SUBJECTIVE STATEMENT:  Pt reports she missed both of her appointments last week due to her garage door not working and her daughter was missing for 9 days. Pt reports her daughter is now located.  Pt inquiring about aquatic therapy.  Pt eager to participate in therapy session today.   Pt accompanied by: self  PERTINENT HISTORY:  PMH anxiety, arthritis, DM, osteoporosis RLS, hx L total hip 2007, RTC repair L 2014 and 2015, spine surgery 1984. Pt arrived late to scheduled evaluation and is extremely verbose and tangential throughout evaluation.  Recently in ED 12/10/22 with low blood sugar, reported at time she had not been eating much. Pt reports that in 2023 sustained femur fracture, After not taking care of her self while caring for husband that was struggling with Dementia. She States that she has been slow to get going and gets tired extremely quickly. reports that her lower back and hips hurt constantly when on her feet.  Reports significant pain in BLE from chronic fx and truama.   PAIN:  Are you having pain? Yes: NPRS scale: 7.5/10  Pain location: lower back and hips  Pain description: "vivid" constant   Aggravating factors: walking  Relieving factors: mild improvement with tramadol, has become less affective over the last year ot 2.   PRECAUTIONS: Fall  RED FLAGS: Compression fracture: Yes: spinal surgrey in 1983    WEIGHT BEARING RESTRICTIONS: No  FALLS: Has patient fallen in last 6 months? No and a few near falls   LIVING ENVIRONMENT: Lives with: lives alone Lives in: House/apartment Stairs: Yes: Internal: 14 steps; on right going up and External: 6 steps; on right going up Has following equipment at home: Wheelchair (manual) and walking sticks.   PLOF: Independent with basic ADLs and Independent with household mobility without device  PATIENT GOALS: be on feet for longer time. Less pain.   OBJECTIVE:  Note: Objective measures were completed at Evaluation unless  otherwise noted.  DIAGNOSTIC FINDINGS:   CT cervical spine   IMPRESSION: Moderate to advanced degenerative  disc and facet disease.   No acute bony abnormality.    COGNITION: Overall cognitive status: Within functional limits for tasks assessed   SENSATION: WFL  COORDINATION: WFL with testing. Mild staggering of gait.   EDEMA:  Mild edema in Bil hands and L   MUSCLE TONE: WFL    POSTURE: rounded shoulders, forward head, and decreased lumbar lordosis  LOWER EXTREMITY ROM:     Grossly WFL.  Limited trunk and lumbar extension to 5 deg.  Trunk flexion WFL.  Mild instability with trunk rotation, but grossly WFL ROM.    LOWER EXTREMITY MMT:    MMT Right Eval Left Eval  Hip flexion 4+ 4  Hip extension 4 4  Hip abduction 4+ 4+  Hip adduction 5 5  Hip internal rotation    Hip external rotation    Knee flexion 4+ 4+  Knee extension 5 5  Ankle dorsiflexion 4+ 4  Ankle plantarflexion    Ankle inversion    Ankle eversion    (Blank rows = not tested)  BED MOBILITY:  Sit to supine Complete Independence Supine to sit Complete Independence  TRANSFERS: Assistive device utilized: None  Sit to stand: SBA Stand to sit: SBA Chair to chair: SBA Floor:  not assessed     CURB:  Level of Assistance: CGA Assistive device utilized:  rail Curb Comments:   STAIRS: Level of Assistance: CGA Stair Negotiation Technique: Step to Pattern with Bilateral Rails Number of Stairs: 4  Height of Stairs: 6  Comments:   GAIT: Gait pattern:  occasional staggering, step through pattern, and narrow BOS Distance walked: 60 Assistive device utilized: None Level of assistance: SBA Comments: staggering in turn and on first bout of  FUNCTIONAL TESTS:  5 times sit to stand: 13.78 Timed up and go (TUG): 10.28sec  6 minute walk test: To be completed  10 meter walk test: 11.86, 13.21sec (0.41m/s) Berg Balance Scale: to be completed  Functional gait assessment: to be  completed   PATIENT SURVEYS:  ABC scale 93.375                                                                                                                              TREATMENT DATE: 03/29/2023  Educated pt on option of aquatic therapy in [place] through [facility]. Educated pt that she might need a referral from MD for aquatic therapy if she would like to incorporate that as part of her physical therapy. Educated she might have to have all of her therapy services moved to the [facility] if she would like to go this route. Pt planning to follow-up on whether or not this is what her preference would be.   Unless otherwise stated, CGA was provided and gait belt donned in order to ensure pt safety throughout session.  Gait training ~68ft, no AD, with light min A for steadying due to slight L sway/postural instability - has excessive thoracic kyphosis and spinal  curvature causing a lateral trunk flexion towards L resulting in predominantly L LOB. - slow gait speed, narrow BOS, small step lengths  Pt participated in Functional Gait Assessment (FGA) with score of 12/30 demonstrating high fall risk (low fall risk 25-28, medium fall risk 19-24, and high fall risk <19). Greatest challenge with horizontal head rotations, change in gait speed, stepping over obstacle, narrow BOS, and eyes closed.   Pt reports onset of low back pain with prolonged walking - improves with seated rest break - states changing positions improves her LBP the most (if sitting for too long becomes "stiff") and that walking any distance increases her pain.  Patient participated in [facility] and demonstrates increased fall risk as noted by score of   39/56. (<36= high risk for falls, close to 100%; 37-45 significant >80%; 46-51 moderate >50%; 52-55 lower >25%).    [facility] PT Assessment - 03/29/23 1627       Standardized Balance Assessment   Standardized Balance Assessment Berg Balance Test      Berg Balance  Test   Sit to Stand Able to stand without using hands and stabilize independently    Standing Unsupported Able to stand safely 2 minutes    Sitting with Back Unsupported but Feet Supported on Floor or Stool Able to sit safely and securely 2 minutes    Stand to Sit Sits safely with minimal use of hands    Transfers Able to transfer safely, minor use of hands    Standing Unsupported with Eyes Closed Able to stand 10 seconds safely    Standing Unsupported with Feet Together Able to place feet together independently and stand 1 minute safely    From Standing, Reach Forward with Outstretched Arm Can reach forward >5 cm safely (2")    From Standing Position, Pick up Object from Floor Able to pick up shoe safely and easily    From Standing Position, Turn to Look Behind Over each Shoulder Needs supervision when turning    Turn 360 Degrees Able to turn 360 degrees safely but slowly   requires >4 seconds turning to L   Standing Unsupported, Alternately Place Feet on Step/Stool Needs assistance to keep from falling or unable to try   L LOB immediately upon trying to lift R LE   Standing Unsupported, One Foot in Front Able to take small step independently and hold 30 seconds    Standing on One Leg Unable to try or needs assist to prevent fall    Total Score 39      Functional Gait  Assessment   Gait assessed  Yes    Gait Level Surface Walks 20 ft, slow speed, abnormal gait pattern, evidence for imbalance or deviates 10-15 in outside of the 12 in walkway width. Requires more than 7 sec to ambulate 20 ft.   13.37 and 14.00   Change in Gait Speed Makes only minor adjustments to walking speed, or accomplishes a change in speed with significant gait deviations, deviates 10-15 in outside the 12 in walkway width, or changes speed but loses balance but is able to recover and continue walking.    Gait with Horizontal Head Turns Performs head turns with moderate changes in gait velocity, slows down, deviates 10-15 in  outside 12 in walkway width but recovers, can continue to walk.    Gait with Vertical Head Turns Performs task with slight change in gait velocity (eg, minor disruption to smooth gait path), deviates 6 - 10 in outside 12  in walkway width or uses assistive device    Gait and Pivot Turn Pivot turns safely in greater than 3 sec and stops with no loss of balance, or pivot turns safely within 3 sec and stops with mild imbalance, requires small steps to catch balance.    Step Over Obstacle Is able to step over one shoe box (4.5 in total height) but must slow down and adjust steps to clear box safely. May require verbal cueing.    Gait with Narrow Base of Support Ambulates less than 4 steps heel to toe or cannot perform without assistance.    Gait with Eyes Closed Cannot walk 20 ft without assistance, severe gait deviations or imbalance, deviates greater than 15 in outside 12 in walkway width or will not attempt task.   requires light min A due to balance instability   Ambulating Backwards Walks 20 ft, uses assistive device, slower speed, mild gait deviations, deviates 6-10 in outside 12 in walkway width.    Steps Alternating feet, must use rail.    Total Score 12            Performed standing 3/4 semi-tandem eyes closed x30sec each with close SBA for safety. Educated pt on safe set-up of this exercise at home.   Provided pt with below HEP.    PATIENT EDUCATION: Education details: POC Person educated: Patient Education method: Explanation Education comprehension: verbalized understanding  HOME EXERCISE PROGRAM:  Access Code: [email or web address] URL: [email or web address] Date: 03/29/2023 Prepared by: Casimiro Needle  Exercises - Sit to Stand with Arms Crossed  - 1 x daily - 7 x weekly - 2 sets - 10 reps - Standing March with Counter Support  - 1 x daily - 7 x weekly - 2 sets - 10 reps - Standing Romberg to 3/4 Tandem Stance  - 1 x daily - 7 x weekly - 2 sets - 30 seconds  hold   GOALS: Goals reviewed with patient? Yes   SHORT TERM GOALS: Target date: 04/09/2023   Patient will be independent in home exercise program to improve strength/mobility for better functional independence with ADLs. Baseline: to be given on visit 2  03/29/2023: provided initial HEP Goal status: INITIAL   LONG TERM GOALS: Target date: 06/04/2023    Patient will increase ABC score  by equal to or greater 3points   to demonstrate statistically significant improvement in mobility and quality of life.  Baseline: 93.375% Goal status: INITIAL  2.  Patient (> 23 years old) will complete five times sit to stand test in < 12 seconds indicating an increased LE strength and improved balance. Baseline: 13.78 Goal status: INITIAL  3.  Patient will increase Berg Balance score by > 6 points to demonstrate decreased fall risk during functional activities Baseline: 03/29/2023: 39/56 Goal status: INITIAL  4.  Patient will increase 10 meter walk test to >1.42m/s as to improve gait speed for better community ambulation and to reduce fall risk. Baseline: 0.21m/s Goal status: INITIAL  5.  Patient will increase 6 min walk test  by 162ft to demonstrate improved balance and tolerance to standing, allowing improved community access and independence.  Baseline: to be completed  Goal status: INITIAL  6.  Patient will increase FGA score to >/ 24 as to demonstrate reduced fall risk and improved dynamic gait balance for better safety with community/home ambulation. Baseline: 03/29/2023: 12/30 Goal status: INITIAL   ASSESSMENT:  CLINICAL IMPRESSION: Patient is a 72y.o. female who was seen today for physical therapy treatment for  impaired balance, B LE weakness, impaired endurance, impaired motor planning, and gait deficits due to multiple comorbidities and hx of many injuries. Pt demonstrates increased fall risk as noted on Berg Balance Test and FGA with pt having greatest difficulty with tasks  requiring narrow BOS, eyes closed, obstacle navigation or single-leg-stance, and head turns. Therapist provided pt with initial HEP targeting B LE functional strengthening and standing balance. Pt inquiring about her interest in aquatic therapy and is to follow-up next visit if she would like to transition her therapy POC to another clinic to allow her to participate in this service - this may be beneficial given pt's history of multiple spine and LB injuries resulting in pain with prolonged walking. Will benefit from continued PT to address strength and balance deficits as well as address pain in low back to improve overall QoL.  OBJECTIVE IMPAIRMENTS: Abnormal gait, cardiopulmonary status limiting activity, decreased activity tolerance, decreased balance, decreased coordination, decreased endurance, decreased knowledge of use of DME, decreased mobility, difficulty walking, decreased strength, decreased safety awareness, hypomobility, increased fascial restrictions, and impaired flexibility.   ACTIVITY LIMITATIONS: carrying, lifting, bending, sitting, stairs, and locomotion level  PARTICIPATION LIMITATIONS: cleaning, laundry, driving, shopping, and community activity  PERSONAL FACTORS: Age and 1 comorbidity: hx of back fx and surgery   are also affecting patient's functional outcome.   REHAB POTENTIAL: Good  CLINICAL DECISION MAKING: Stable/uncomplicated  EVALUATION COMPLEXITY: Moderate  PLAN:  PT FREQUENCY: 1-2x/week  PT DURATION: 12 weeks  PLANNED INTERVENTIONS: 97110-Therapeutic exercises, 97530- Therapeutic activity, 97112- Neuromuscular re-education, 97535- Self Care, [id number]- Manual therapy, [phone]- Gait training, [phone]- Aquatic Therapy, Patient/Family education, Balance training, Stair training, Taping, Dry Needling, Joint mobilization, Joint manipulation, DME instructions, Cryotherapy, and Moist heat  PLAN FOR NEXT SESSION:  - 6 min walk test - follow-up on aquatic therapy interest   - follow-up on HEP provided  - dynamic balance including:   - trunk rotations  - alternating foot taps  - single leg stance  - eyes closed - dynamic gait training including:  -  horizontal head turns - obstacle navigation - change in gait speed    Kavon Valenza, PT, DPT, NCS, CSRS Physical Therapist - [facility]  5:39 PM 03/29/23

## 2023-03-31 ENCOUNTER — Ambulatory Visit: Payer: Medicare Other | Admitting: Physical Therapy

## 2023-03-31 DIAGNOSIS — R269 Unspecified abnormalities of gait and mobility: Secondary | ICD-10-CM

## 2023-03-31 DIAGNOSIS — M79672 Pain in left foot: Secondary | ICD-10-CM

## 2023-03-31 DIAGNOSIS — M6281 Muscle weakness (generalized): Secondary | ICD-10-CM

## 2023-03-31 DIAGNOSIS — M5459 Other low back pain: Secondary | ICD-10-CM

## 2023-03-31 DIAGNOSIS — R262 Difficulty in walking, not elsewhere classified: Secondary | ICD-10-CM

## 2023-03-31 NOTE — Therapy (Signed)
 OUTPATIENT PHYSICAL THERAPY NEURO TREATMENT   Patient Name: REGINAE WOLFREY MRN: [id number] DOB:12/02/1950, 73 y.o., female Today's Date: 03/31/2023   PCP: Gracelyn Nurse, MD  REFERRING PROVIDER:   Gracelyn Nurse, MD    END OF SESSION:   PT End of Session - 03/31/23 1614     Visit Number 3    Number of Visits 24    Date for PT Re-Evaluation 06/03/23    PT Start Time 1615    PT Stop Time 1700    PT Time Calculation (min) 45 min    Equipment Utilized During Treatment Gait belt    Activity Tolerance Patient tolerated treatment well    Behavior During Therapy WFL for tasks assessed/performed               Past Medical History:  Diagnosis Date   Anxiety    Arthritis    Asthma    Diabetes mellitus without complication (HCC)    Insulin pump in place    Osteoporosis    RLS (restless legs syndrome)    Smokers' cough (HCC)    Wears dentures    full upper and lower   Past Surgical History:  Procedure Laterality Date   CARPAL TUNNEL RELEASE Left 11/20/2022   Procedure: CARPAL TUNNEL RELEASE;  Surgeon: Reinaldo Berber, MD;  Location: [facility];  Service: Orthopedics;  Laterality: Left;   CATARACT EXTRACTION W/PHACO Right 02/20/2020   Procedure: CATARACT EXTRACTION PHACO AND INTRAOCULAR LENS PLACEMENT (IOC) RIGHT 5.60 00:36.3;  Surgeon: Galen Manila, MD;  Location: [facility];  Service: Ophthalmology;  Laterality: Right;  Diabetic - insulin pump Requests not first   CATARACT EXTRACTION W/PHACO Left 03/05/2020   Procedure: CATARACT EXTRACTION PHACO AND INTRAOCULAR LENS PLACEMENT (IOC) LEFT 4.94 00:31.6;  Surgeon: Galen Manila, MD;  Location: [facility];  Service: Ophthalmology;  Laterality: Left;   HIP PINNING,CANNULATED Right 02/09/2022   Procedure: PERCUTANEOUS FIXATION OF FEMORAL NECK;  Surgeon: Deeann Saint, MD;  Location: [facility];  Service: Orthopedics;  Laterality: Right;   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   ROTATOR CUFF  REPAIR Left 2014 and 2015   SPINE SURGERY N/A 1984   L4-5, not sure of procedure   THYROID SURGERY     Patient Active Problem List   Diagnosis Date Noted   Generalized anxiety disorder 11/29/2022   RLS (restless legs syndrome) 11/29/2022   Acute metabolic encephalopathy 11/19/2022   Pain and swelling of left wrist 11/19/2022   Fall 11/19/2022   Suppurative tenosynovitis of flexor tendon of left hand 11/19/2022   Rib pain on left side 08/25/2022   SBO (small bowel obstruction) (HCC) 05/06/2022   Small bowel obstruction (HCC) 05/06/2022   Portal vein obstruction 05/06/2022   Right hip pain 02/14/2022   Hip pain, acute, right 02/13/2022   Electrolyte abnormality 02/13/2022   Anemia 02/13/2022   Malnutrition of moderate degree 02/10/2022   Closed right hip fracture (HCC) 02/08/2022   Hypomagnesemia 02/08/2022   Cellulitis of right ankle 02/08/2022   Hyponatremia 02/08/2022   Tobacco abuse 02/08/2022   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) 02/08/2022   Lumbar spondylosis 11/27/2021   Lumbar degenerative disc disease 11/27/2021   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/27/2021   Chronic left shoulder pain 11/27/2021   Cervical facet joint syndrome 11/27/2021   Cervical radicular pain 11/27/2021   Chronic pain syndrome 11/27/2021   Cellulitis of right wrist 12/21/2020   Cellulitis of right thigh 12/11/2020  Hypoglycemia 12/10/2020    ONSET DATE: >1year   REFERRING DIAG:  Diagnosis  R26.81 (ICD-10-CM) - Gait instability    THERAPY DIAG:  Abnormality of gait and mobility  Muscle weakness (generalized)  Difficulty in walking, not elsewhere classified  Other low back pain  Pain in left foot  Rationale for Evaluation and Treatment: Rehabilitation  SUBJECTIVE:                                                                                                                                                                                              SUBJECTIVE STATEMENT:  Pt reports she stayed up too late watching [place] last night and is now tired today.  Pt accompanied by: self and daughter, [name]  PERTINENT HISTORY:  PMH anxiety, arthritis, DM, osteoporosis RLS, hx L total hip 2007, RTC repair L 2014 and 2015, spine surgery 1984. Pt arrived late to scheduled evaluation and is extremely verbose and tangential throughout evaluation.  Recently in ED 12/10/22 with low blood sugar, reported at time she had not been eating much. Pt reports that in 2023 sustained femur fracture, After not taking care of her self while caring for husband that was struggling with Dementia. She States that she has been slow to get going and gets tired extremely quickly. reports that her lower back and hips hurt constantly when on her feet.  Reports significant pain in BLE from chronic fx and truama.   PAIN:  Are you having pain? Yes: NPRS scale: 7.5/10  Pain location: lower back and hips  Pain description: "vivid" constant   Aggravating factors: walking  Relieving factors: mild improvement with tramadol, has become less affective over the last year ot 2.   PRECAUTIONS: Fall  RED FLAGS: Compression fracture: Yes: spinal surgrey in 1983    WEIGHT BEARING RESTRICTIONS: No  FALLS: Has patient fallen in last 6 months? No and a few near falls   LIVING ENVIRONMENT: Lives with: lives alone Lives in: House/apartment Stairs: Yes: Internal: 14 steps; on right going up and External: 6 steps; on right going up Has following equipment at home: Wheelchair (manual) and walking sticks.   PLOF: Independent with basic ADLs and Independent with household mobility without device  PATIENT GOALS: be on feet for longer time. Less pain.   OBJECTIVE:  Note: Objective measures were completed at Evaluation unless otherwise noted.  DIAGNOSTIC FINDINGS:   CT cervical spine   IMPRESSION: Moderate to advanced degenerative disc and facet disease.   No acute  bony abnormality.    COGNITION: Overall cognitive status: Within functional limits for tasks assessed   SENSATION: WFL  COORDINATION:  WFL with testing. Mild staggering of gait.   EDEMA:  Mild edema in Bil hands and L   MUSCLE TONE: WFL    POSTURE: rounded shoulders, forward head, and decreased lumbar lordosis  LOWER EXTREMITY ROM:     Grossly WFL.  Limited trunk and lumbar extension to 5 deg.  Trunk flexion WFL.  Mild instability with trunk rotation, but grossly WFL ROM.    LOWER EXTREMITY MMT:    MMT Right Eval Left Eval  Hip flexion 4+ 4  Hip extension 4 4  Hip abduction 4+ 4+  Hip adduction 5 5  Hip internal rotation    Hip external rotation    Knee flexion 4+ 4+  Knee extension 5 5  Ankle dorsiflexion 4+ 4  Ankle plantarflexion    Ankle inversion    Ankle eversion    (Blank rows = not tested)  BED MOBILITY:  Sit to supine Complete Independence Supine to sit Complete Independence  TRANSFERS: Assistive device utilized: None  Sit to stand: SBA Stand to sit: SBA Chair to chair: SBA Floor:  not assessed     CURB:  Level of Assistance: CGA Assistive device utilized:  rail Curb Comments:   STAIRS: Level of Assistance: CGA Stair Negotiation Technique: Step to Pattern with Bilateral Rails Number of Stairs: 4  Height of Stairs: 6  Comments:   GAIT: Gait pattern:  occasional staggering, step through pattern, and narrow BOS Distance walked: 60 Assistive device utilized: None Level of assistance: SBA Comments: staggering in turn and on first bout of  FUNCTIONAL TESTS:  5 times sit to stand: 13.78 Timed up and go (TUG): 10.28sec  6 minute walk test: To be completed  10 meter walk test: 11.86, 13.21sec (0.101m/s) Berg Balance Scale: to be completed  Functional gait assessment: to be completed   PATIENT SURVEYS:  ABC scale 93.375                                                                                                                               TREATMENT DATE: 03/31/2023  Therapist provided extensive education on the following:  - option of participating in water aerobic exercise classes at [facility]; however, therapist concerns regarding her ability to safely participate in a group setting and not recommending this at this time   - information on [facility] center handout provided - recommendation that if she would like to participate in one-on-one aquatic physical therapy then would advise she transfer her care to the [facility]	[place] on [address] to participate in land and aquatic PT combo  - educated pt that aquatic therapy is done at [facility] on [address] - pt continues to report difficulty managing her blood sugar - educated her on [employer] [facility] at [employer] - hand-out of their information provided  Unless otherwise stated, CGA/SBA was provided and gait belt donned in order to ensure pt safety  throughout session.  6 Min Walk Test:  Instructed patient to ambulate as quickly and as safely as possible for 6 minutes using LRAD. Patient was allowed to take standing rest breaks without stopping the test, but if the patient required a sitting rest break the clock would be stopped and the test would be over.  Results: 475 feet *had to stop at due to onset of pain in bilateral hips* (144 meters, Avg speed 0.76m/s) no AD with SBA and occasional CGA for safety. Results indicate that the patient has reduced endurance with ambulation compared to age matched norms. Pt starts to demonstrate antalgic gait pattern with further crouched posture, shorter step lengths, and increased postural sway when pain onset. Age Matched Norms: 68-69 yo M: 63 F: 16, 43-79 yo M: 42 F: 471, 57-89 yo M: 417 F: 392 MDC: 58.21 meters (190.98 feet) or 50 meters (ANPTA Core Set of Outcome Measures for Adults with Neurologic Conditions,  2018)  Dynamic balance interventions including:  - alternating foot taps to 4" brown step - pt reports hesitating with this due to feeling unstable - CGA with a few instances of light min A for balance - progressed to forward step-ups on 4" brown step x10reps each LE with min A progressed to lighter min/CGA after repetition - progressed to added dual-task balance challenge of trunk rotation to tap [employer] each time she stepped back down - put on sequence mode for consistency at this time but would benefit from progression of random setting    Pt reports doing her HEP, but has to do it before her pain starts. Pt denies increased pain with HEP.  Daughter reports pt has too many items around the house that are trip hazards and placing pt at increased risk for falls. Discussed option of hiring professional support to help pt organize items in her home, given pt's limited mobility and pain with prolonged walking/mobility.   PATIENT EDUCATION: Education details: POC Person educated: Patient Education method: Explanation Education comprehension: verbalized understanding  HOME EXERCISE PROGRAM:  Access Code: [email or web address] URL: [email or web address] Date: 03/29/2023 Prepared by: Casimiro Needle  Exercises - Sit to Stand with Arms Crossed  - 1 x daily - 7 x weekly - 2 sets - 10 reps - Standing March with Counter Support  - 1 x daily - 7 x weekly - 2 sets - 10 reps - Standing Romberg to 3/4 Tandem Stance  - 1 x daily - 7 x weekly - 2 sets - 30 seconds hold   GOALS: Goals reviewed with patient? Yes   SHORT TERM GOALS: Target date: 04/09/2023   Patient will be independent in home exercise program to improve strength/mobility for better functional independence with ADLs. Baseline: to be given on visit 2  03/29/2023: provided initial HEP Goal status: INITIAL   LONG TERM GOALS: Target date: 06/04/2023    Patient will increase ABC score  by equal to or greater 3points   to  demonstrate statistically significant improvement in mobility and quality of life.  Baseline: 93.375% Goal status: INITIAL  2.  Patient (> 34 years old) will complete five times sit to stand test in < 12 seconds indicating an increased LE strength and improved balance. Baseline: 13.78 Goal status: INITIAL  3.  Patient will increase Berg Balance score by > 6 points to demonstrate decreased fall risk during functional activities Baseline: 03/29/2023: 39/56 Goal status: INITIAL  4.  Patient will increase 10 meter walk test to >1.56m/s as to improve gait speed  for better community ambulation and to reduce fall risk. Baseline: 0.36m/s Goal status: INITIAL  5.  Patient will increase 6 min walk test  by 141ft to demonstrate improved balance and tolerance to standing, allowing improved community access and independence.  Baseline: 03/31/2023: 475 feet *had to stop at due to onset of pain in bilateral hips* Goal status: INITIAL  6.  Patient will increase FGA score to >/ 24 as to demonstrate reduced fall risk and improved dynamic gait balance for better safety with community/home ambulation. Baseline: 03/29/2023: 12/30 Goal status: INITIAL   ASSESSMENT:  CLINICAL IMPRESSION:  Patient is a 72y.o. female who was seen today for physical therapy treatment for impaired balance, B LE weakness, impaired endurance, impaired motor planning, and gait deficits due to multiple comorbidities and hx of many injuries. Patient demonstrates significant endurance deficits on 6 min walk test with pt needing to stop ambulating at 3 minutes due to onset of bilateral hip pain, resulting in only achieving 422ft. Therapy session focused on extensive pt and family education regarding option for aquatic physical therapy due to pt's strong interest in participating in this. Pt participated in dynamic balance challenge of alternating foot taps progressed to forward step-ups with pt requiring light min assist for this;  however, improving with repetition. Pt reports good compliance with HEP and denies increased pain when performing it. Pt is to notify thearpist at next visit if she would like to transition her therapy POC to [facility] in order to participate in aquatic therapy. Will benefit from continued skilled PT to address strength and balance deficits as well as address pain in low back to improve overall QoL.  OBJECTIVE IMPAIRMENTS: Abnormal gait, cardiopulmonary status limiting activity, decreased activity tolerance, decreased balance, decreased coordination, decreased endurance, decreased knowledge of use of DME, decreased mobility, difficulty walking, decreased strength, decreased safety awareness, hypomobility, increased fascial restrictions, and impaired flexibility.   ACTIVITY LIMITATIONS: carrying, lifting, bending, sitting, stairs, and locomotion level  PARTICIPATION LIMITATIONS: cleaning, laundry, driving, shopping, and community activity  PERSONAL FACTORS: Age and 1 comorbidity: hx of back fx and surgery   are also affecting patient's functional outcome.   REHAB POTENTIAL: Good  CLINICAL DECISION MAKING: Stable/uncomplicated  EVALUATION COMPLEXITY: Moderate  PLAN:  PT FREQUENCY: 1-2x/week  PT DURATION: 12 weeks  PLANNED INTERVENTIONS: 97110-Therapeutic exercises, 97530- Therapeutic activity, 97112- Neuromuscular re-education, 97535- Self Care, 16109- Manual therapy, [phone]- Gait training, [phone]- Aquatic Therapy, Patient/Family education, Balance training, Stair training, Taping, Dry Needling, Joint mobilization, Joint manipulation, DME instructions, Cryotherapy, and Moist heat  PLAN FOR NEXT SESSION:  - follow-up on aquatic therapy interest  - progress HEP when appropriate - dynamic balance including:   - trunk rotations  -  step-ups/alternating foot taps  - single leg stance  - eyes closed - dynamic gait training including:  - horizontal head turns - obstacle navigation -  change in gait speed    Arel Tippen, PT, DPT, NCS, CSRS Physical Therapist - [place]  [facility]  5:50 PM 03/31/23

## 2023-04-05 ENCOUNTER — Ambulatory Visit: Payer: Medicare Other | Admitting: Physical Therapy

## 2023-04-06 ENCOUNTER — Telehealth: Payer: Self-pay

## 2023-04-06 NOTE — Therapy (Unsigned)
 OUTPATIENT PHYSICAL THERAPY NEURO TREATMENT   Patient Name: Suzanne Snyder MRN: [id number] DOB:[birth year], 73 y.o., female Today's Date: 04/07/2023   PCP: Gracelyn Nurse, MD  REFERRING PROVIDER:   Gracelyn Nurse, MD    END OF SESSION:   PT End of Session - 04/07/23 1612     Visit Number 4    Number of Visits 24    Date for PT Re-Evaluation 06/03/23    PT Start Time 1617    PT Stop Time 1657    PT Time Calculation (min) 40 min    Equipment Utilized During Treatment Gait belt    Activity Tolerance Patient tolerated treatment well    Behavior During Therapy [facility] for tasks assessed/performed                Past Medical History:  Diagnosis Date   Anxiety    Arthritis    Asthma    Diabetes mellitus without complication (HCC)    Insulin pump in place    Osteoporosis    RLS (restless legs syndrome)    Smokers' cough (HCC)    Wears dentures    full upper and lower   Past Surgical History:  Procedure Laterality Date   CARPAL TUNNEL RELEASE Left 11/20/2022   Procedure: CARPAL TUNNEL RELEASE;  Surgeon: Reinaldo Berber, MD;  Location: [facility];  Service: Orthopedics;  Laterality: Left;   CATARACT EXTRACTION W/PHACO Right 02/20/2020   Procedure: CATARACT EXTRACTION PHACO AND INTRAOCULAR LENS PLACEMENT (IOC) RIGHT 5.60 00:36.3;  Surgeon: Galen Manila, MD;  Location: [facility];  Service: Ophthalmology;  Laterality: Right;  Diabetic - insulin pump Requests not first   CATARACT EXTRACTION W/PHACO Left 03/05/2020   Procedure: CATARACT EXTRACTION PHACO AND INTRAOCULAR LENS PLACEMENT (IOC) LEFT 4.94 00:31.6;  Surgeon: Galen Manila, MD;  Location: [facility];  Service: Ophthalmology;  Laterality: Left;   HIP PINNING,CANNULATED Right 02/09/2022   Procedure: PERCUTANEOUS FIXATION OF FEMORAL NECK;  Surgeon: Deeann Saint, MD;  Location: [facility];  Service: Orthopedics;  Laterality: Right;   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   ROTATOR CUFF  REPAIR Left 2014 and 2015   SPINE SURGERY N/A 1984   L4-5, not sure of procedure   THYROID SURGERY     Patient Active Problem List   Diagnosis Date Noted   Generalized anxiety disorder 11/29/2022   RLS (restless legs syndrome) 11/29/2022   Acute metabolic encephalopathy 11/19/2022   Pain and swelling of left wrist 11/19/2022   Fall 11/19/2022   Suppurative tenosynovitis of flexor tendon of left hand 11/19/2022   Rib pain on left side 08/25/2022   SBO (small bowel obstruction) (HCC) 05/06/2022   Small bowel obstruction (HCC) 05/06/2022   Portal vein obstruction 05/06/2022   Right hip pain 02/14/2022   Hip pain, acute, right 02/13/2022   Electrolyte abnormality 02/13/2022   Anemia 02/13/2022   Malnutrition of moderate degree 02/10/2022   Closed right hip fracture (HCC) 02/08/2022   Hypomagnesemia 02/08/2022   Cellulitis of right ankle 02/08/2022   Hyponatremia 02/08/2022   Tobacco abuse 02/08/2022   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) 02/08/2022   Lumbar spondylosis 11/27/2021   Lumbar degenerative disc disease 11/27/2021   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/27/2021   Chronic left shoulder pain 11/27/2021   Cervical facet joint syndrome 11/27/2021   Cervical radicular pain 11/27/2021   Chronic pain syndrome 11/27/2021   Cellulitis of right wrist 12/21/2020   Cellulitis of right thigh 12/11/2020  Hypoglycemia 12/10/2020    ONSET DATE: >1year   REFERRING DIAG:  Diagnosis  R26.81 (ICD-10-CM) - Gait instability    THERAPY DIAG:  Abnormality of gait and mobility  Muscle weakness (generalized)  Difficulty in walking, not elsewhere classified  Rationale for Evaluation and Treatment: Rehabilitation  SUBJECTIVE:                                                                                                                                                                                             SUBJECTIVE STATEMENT:  Pt reports  she did research and looking into aquatic therapy. Is seeking to transfer there when possible. No falls or other changes of note.   Pt accompanied by: self and daughter, [name]  PERTINENT HISTORY:  PMH anxiety, arthritis, DM, osteoporosis RLS, hx L total hip 2007, RTC repair L 2014 and 2015, spine surgery 1984. Pt arrived late to scheduled evaluation and is extremely verbose and tangential throughout evaluation.  Recently in ED 12/10/22 with low blood sugar, reported at time she had not been eating much. Pt reports that in 2023 sustained femur fracture, After not taking care of her self while caring for husband that was struggling with Dementia. She States that she has been slow to get going and gets tired extremely quickly. reports that her lower back and hips hurt constantly when on her feet.  Reports significant pain in BLE from chronic fx and truama.   PAIN:  Are you having pain? Yes: NPRS scale: 7.5/10  Pain location: lower back and hips  Pain description: "vivid" constant   Aggravating factors: walking  Relieving factors: mild improvement with tramadol, has become less affective over the last year ot 2.   PRECAUTIONS: Fall  RED FLAGS: Compression fracture: Yes: spinal surgrey in 1983    WEIGHT BEARING RESTRICTIONS: No  FALLS: Has patient fallen in last 6 months? No and a few near falls   LIVING ENVIRONMENT: Lives with: lives alone Lives in: House/apartment Stairs: Yes: Internal: 14 steps; on right going up and External: 6 steps; on right going up Has following equipment at home: Wheelchair (manual) and walking sticks.   PLOF: Independent with basic ADLs and Independent with household mobility without device  PATIENT GOALS: be on feet for longer time. Less pain.   OBJECTIVE:  Note: Objective measures were completed at Evaluation unless otherwise noted.  DIAGNOSTIC FINDINGS:   CT cervical spine   IMPRESSION: Moderate to advanced degenerative disc and facet disease.    No acute bony abnormality.    COGNITION: Overall cognitive status: Within functional limits for tasks assessed   SENSATION: WFL  COORDINATION: [facility]  with testing. Mild staggering of gait.   EDEMA:  Mild edema in Bil hands and L   MUSCLE TONE: WFL    POSTURE: rounded shoulders, forward head, and decreased lumbar lordosis  LOWER EXTREMITY ROM:     Grossly WFL.  Limited trunk and lumbar extension to 5 deg.  Trunk flexion WFL.  Mild instability with trunk rotation, but grossly WFL ROM.    LOWER EXTREMITY MMT:    MMT Right Eval Left Eval  Hip flexion 4+ 4  Hip extension 4 4  Hip abduction 4+ 4+  Hip adduction 5 5  Hip internal rotation    Hip external rotation    Knee flexion 4+ 4+  Knee extension 5 5  Ankle dorsiflexion 4+ 4  Ankle plantarflexion    Ankle inversion    Ankle eversion    (Blank rows = not tested)  BED MOBILITY:  Sit to supine Complete Independence Supine to sit Complete Independence  TRANSFERS: Assistive device utilized: None  Sit to stand: SBA Stand to sit: SBA Chair to chair: SBA Floor:  not assessed     CURB:  Level of Assistance: CGA Assistive device utilized:  rail Curb Comments:   STAIRS: Level of Assistance: CGA Stair Negotiation Technique: Step to Pattern with Bilateral Rails Number of Stairs: 4  Height of Stairs: 6  Comments:   GAIT: Gait pattern:  occasional staggering, step through pattern, and narrow BOS Distance walked: 60 Assistive device utilized: None Level of assistance: SBA Comments: staggering in turn and on first bout of  FUNCTIONAL TESTS:  5 times sit to stand: 13.78 Timed up and go (TUG): 10.28sec  6 minute walk test: To be completed  10 meter walk test: 11.86, 13.21sec (0.58m/s) Berg Balance Scale: to be completed  Functional gait assessment: to be completed   PATIENT SURVEYS:  ABC scale 93.375                                                                                                                               TREATMENT DATE: 04/07/2023    Unless otherwise stated, CGA/SBA was provided and gait belt donned in order to ensure pt safety throughout session. TE Nustep level 1 x 6 min LE only due to L shoulder discomfort with extension. Some low back discomfort    NMR - dynamic balance including:   -  alternating foot taps x 10 to 6in step  - single leg stance 2 x 30 sec ea LE   - eyes closed 3 x 30 sec adducted stance  - dynamic gait training including:  - obstacle navigation x 6 cones weaving between x 6 times through -forward and retro gait x 3 laps  - change in gait speed x 170 ft ( SLOW, FAST, STOP, NORMAL on command)   Kore balance trainer working on patients proprioception and standing balance on dynamic surface  Setting type(s): tux racer bunny slope  Reps: 3 Comments: good ability  to adapt to surface, UE use required    TA  Ambulation with transport chair using it similar to a shopping cart from physical therapy treatment area to elevator and to valet parking area to end session.  Some fatigue and imbalance noted particularly when getting on the elevator standby assist provided throughout.  PATIENT EDUCATION: Education details: POC Person educated: Patient Education method: Explanation Education comprehension: verbalized understanding  HOME EXERCISE PROGRAM:  Access Code: [email or web address] URL: [email or web address] Date: 03/29/2023 Prepared by: Casimiro Needle  Exercises - Sit to Stand with Arms Crossed  - 1 x daily - 7 x weekly - 2 sets - 10 reps - Standing March with Counter Support  - 1 x daily - 7 x weekly - 2 sets - 10 reps - Standing Romberg to 3/4 Tandem Stance  - 1 x daily - 7 x weekly - 2 sets - 30 seconds hold   GOALS: Goals reviewed with patient? Yes   SHORT TERM GOALS: Target date: 04/09/2023   Patient will be independent in home exercise program to improve strength/mobility for better functional independence with  ADLs. Baseline: to be given on visit 2  03/29/2023: provided initial HEP Goal status: INITIAL   LONG TERM GOALS: Target date: 06/04/2023    Patient will increase ABC score  by equal to or greater 3points   to demonstrate statistically significant improvement in mobility and quality of life.  Baseline: 93.375% Goal status: INITIAL  2.  Patient (> 52 years old) will complete five times sit to stand test in < 12 seconds indicating an increased LE strength and improved balance. Baseline: 13.78 Goal status: INITIAL  3.  Patient will increase Berg Balance score by > 6 points to demonstrate decreased fall risk during functional activities Baseline: 03/29/2023: 39/56 Goal status: INITIAL  4.  Patient will increase 10 meter walk test to >1.75m/s as to improve gait speed for better community ambulation and to reduce fall risk. Baseline: 0.78m/s Goal status: INITIAL  5.  Patient will increase 6 min walk test  by 143ft to demonstrate improved balance and tolerance to standing, allowing improved community access and independence.  Baseline: 03/31/2023: 475 feet *had to stop at due to onset of pain in bilateral hips* Goal status: INITIAL  6.  Patient will increase FGA score to >/ 24 as to demonstrate reduced fall risk and improved dynamic gait balance for better safety with community/home ambulation. Baseline: 03/29/2023: 12/30 Goal status: INITIAL   ASSESSMENT:  CLINICAL IMPRESSION:  Patient is a 72y.o. female who was seen today for physical therapy treatment for impaired balance, B LE weakness, impaired endurance, impaired motor planning, and gait deficits due to multiple comorbidities and hx of many injuries.  Patient challenged with several dynamic gait interventions and overall responded well.  Patient also challenged with some dynamic neuromuscular control interventions and also demonstrated good ability to adapt to these situations.  Patient also test with ambulating to the elevator and  righting elevator up while pushing transport chair acting like a shopping cart.  Physical therapist in session with patient in transport chair and by valet parking area.  Will benefit from continued skilled PT to address strength and balance deficits as well as address pain in low back to improve overall QoL.  OBJECTIVE IMPAIRMENTS: Abnormal gait, cardiopulmonary status limiting activity, decreased activity tolerance, decreased balance, decreased coordination, decreased endurance, decreased knowledge of use of DME, decreased mobility, difficulty walking, decreased strength, decreased safety awareness, hypomobility, increased fascial restrictions, and impaired flexibility.  ACTIVITY LIMITATIONS: carrying, lifting, bending, sitting, stairs, and locomotion level  PARTICIPATION LIMITATIONS: cleaning, laundry, driving, shopping, and community activity  PERSONAL FACTORS: Age and 1 comorbidity: hx of back fx and surgery   are also affecting patient's functional outcome.   REHAB POTENTIAL: Good  CLINICAL DECISION MAKING: Stable/uncomplicated  EVALUATION COMPLEXITY: Moderate  PLAN:  PT FREQUENCY: 1-2x/week  PT DURATION: 12 weeks  PLANNED INTERVENTIONS: 97110-Therapeutic exercises, 97530- Therapeutic activity, 97112- Neuromuscular re-education, 97535- Self Care, 95621- Manual therapy, [phone]- Gait training, [phone]- Aquatic Therapy, Patient/Family education, Balance training, Stair training, Taping, Dry Needling, Joint mobilization, Joint manipulation, DME instructions, Cryotherapy, and Moist heat  PLAN FOR NEXT SESSION:  - follow-up on aquatic therapy interest - follow up on when she is starting  - progress HEP when appropriate - dynamic balance including:   -  step-ups/alternating foot taps  - single leg stance  - eyes closed - dynamic gait training including:  - horizontal head turns - obstacle navigation - change in gait speed    Norman Herrlich PT ,DPT Physical Therapist- [facility]  [facility]  4:25 PM 04/07/23

## 2023-04-06 NOTE — Telephone Encounter (Signed)
 Patient called stating she was ready to restart Dupixent.  She stated she had to d/c due to family issues and is ready to restart.  Scheduled pt with the nurse for 04/12/23 at 11:30./sh

## 2023-04-07 ENCOUNTER — Ambulatory Visit: Payer: Medicare Other | Admitting: Physical Therapy

## 2023-04-07 DIAGNOSIS — M6281 Muscle weakness (generalized): Secondary | ICD-10-CM

## 2023-04-07 DIAGNOSIS — R262 Difficulty in walking, not elsewhere classified: Secondary | ICD-10-CM

## 2023-04-07 DIAGNOSIS — R269 Unspecified abnormalities of gait and mobility: Secondary | ICD-10-CM

## 2023-04-12 ENCOUNTER — Encounter: Payer: Self-pay | Admitting: Physical Therapy

## 2023-04-12 ENCOUNTER — Ambulatory Visit: Payer: Medicare Other

## 2023-04-12 ENCOUNTER — Ambulatory Visit (INDEPENDENT_AMBULATORY_CARE_PROVIDER_SITE_OTHER)

## 2023-04-12 ENCOUNTER — Other Ambulatory Visit: Payer: Self-pay

## 2023-04-12 DIAGNOSIS — R269 Unspecified abnormalities of gait and mobility: Secondary | ICD-10-CM | POA: Diagnosis not present

## 2023-04-12 DIAGNOSIS — M5459 Other low back pain: Secondary | ICD-10-CM

## 2023-04-12 DIAGNOSIS — L209 Atopic dermatitis, unspecified: Secondary | ICD-10-CM

## 2023-04-12 DIAGNOSIS — R262 Difficulty in walking, not elsewhere classified: Secondary | ICD-10-CM

## 2023-04-12 DIAGNOSIS — M6281 Muscle weakness (generalized): Secondary | ICD-10-CM

## 2023-04-12 MED ORDER — DUPIXENT 300 MG/2ML ~~LOC~~ SOSY: 300.0000 mg | PREFILLED_SYRINGE | SUBCUTANEOUS | 5 refills | Status: DC

## 2023-04-12 NOTE — Therapy (Signed)
 OUTPATIENT PHYSICAL THERAPY NEURO TREATMENT   Patient Name: Suzanne Snyder MRN: [id number] DOB:August 02, 1950, 73 y.o., female Today's Date: 04/12/2023   PCP: Suzanne Nurse, MD  REFERRING PROVIDER:   Gracelyn Nurse, MD    END OF SESSION:   PT End of Session - 04/12/23 1309     Visit Number 5    Number of Visits 24    Date for PT Re-Evaluation 06/03/23    PT Start Time 1616    PT Stop Time 1657    PT Time Calculation (min) 41 min    Equipment Utilized During Treatment Gait belt    Activity Tolerance Patient tolerated treatment well    Behavior During Therapy WFL for tasks assessed/performed                 Past Medical History:  Diagnosis Date   Anxiety    Arthritis    Asthma    Diabetes mellitus without complication (HCC)    Insulin pump in place    Osteoporosis    RLS (restless legs syndrome)    Smokers' cough (HCC)    Wears dentures    full upper and lower   Past Surgical History:  Procedure Laterality Date   CARPAL TUNNEL RELEASE Left 11/20/2022   Procedure: CARPAL TUNNEL RELEASE;  Surgeon: Reinaldo Berber, MD;  Location: [facility];  Service: Orthopedics;  Laterality: Left;   CATARACT EXTRACTION W/PHACO Right 02/20/2020   Procedure: CATARACT EXTRACTION PHACO AND INTRAOCULAR LENS PLACEMENT (IOC) RIGHT 5.60 00:36.3;  Surgeon: Galen Manila, MD;  Location: [facility];  Service: Ophthalmology;  Laterality: Right;  Diabetic - insulin pump Requests not first   CATARACT EXTRACTION W/PHACO Left 03/05/2020   Procedure: CATARACT EXTRACTION PHACO AND INTRAOCULAR LENS PLACEMENT (IOC) LEFT 4.94 00:31.6;  Surgeon: Galen Manila, MD;  Location: [facility];  Service: Ophthalmology;  Laterality: Left;   HIP PINNING,CANNULATED Right 02/09/2022   Procedure: PERCUTANEOUS FIXATION OF FEMORAL NECK;  Surgeon: Deeann Saint, MD;  Location: [facility];  Service: Orthopedics;  Laterality: Right;   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   ROTATOR CUFF  REPAIR Left 2014 and 2015   SPINE SURGERY N/A 1984   L4-5, not sure of procedure   THYROID SURGERY     Patient Active Problem List   Diagnosis Date Noted   Generalized anxiety disorder 11/29/2022   RLS (restless legs syndrome) 11/29/2022   Acute metabolic encephalopathy 11/19/2022   Pain and swelling of left wrist 11/19/2022   Fall 11/19/2022   Suppurative tenosynovitis of flexor tendon of left hand 11/19/2022   Rib pain on left Snyder 08/25/2022   SBO (small bowel obstruction) (HCC) 05/06/2022   Small bowel obstruction (HCC) 05/06/2022   Portal vein obstruction 05/06/2022   Right hip pain 02/14/2022   Hip pain, acute, right 02/13/2022   Electrolyte abnormality 02/13/2022   Anemia 02/13/2022   Malnutrition of moderate degree 02/10/2022   Closed right hip fracture (HCC) 02/08/2022   Hypomagnesemia 02/08/2022   Cellulitis of right ankle 02/08/2022   Hyponatremia 02/08/2022   Tobacco abuse 02/08/2022   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) 02/08/2022   Lumbar spondylosis 11/27/2021   Lumbar degenerative disc disease 11/27/2021   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/27/2021   Chronic left shoulder pain 11/27/2021   Cervical facet joint syndrome 11/27/2021   Cervical radicular pain 11/27/2021   Chronic pain syndrome 11/27/2021   Cellulitis of right wrist 12/21/2020   Cellulitis of right thigh  12/11/2020   Hypoglycemia 12/10/2020    ONSET DATE: >1year   REFERRING DIAG:  Diagnosis  R26.81 (ICD-10-CM) - Gait instability    THERAPY DIAG:  Abnormality of gait and mobility  Difficulty in walking, not elsewhere classified  Muscle weakness (generalized)  Other low back pain  Rationale for Evaluation and Treatment: Rehabilitation  SUBJECTIVE:                                                                                                                                                                                             SUBJECTIVE  STATEMENT:    Patient reports she is in a lot of pain in her back this date. She did contact the aquatic therapy clinic and they want to talk to her physical therapist first. She is unsure the reasoning.   Pt accompanied by: self and daughter, Suzanne Snyder  PERTINENT HISTORY:  PMH anxiety, arthritis, DM, osteoporosis RLS, hx L total hip 2007, RTC repair L 2014 and 2015, spine surgery 1984. Pt arrived late to scheduled evaluation and is extremely verbose and tangential throughout evaluation.  Recently in ED 12/10/22 with low blood sugar, reported at time she had not been eating much. Pt reports that in 2023 sustained femur fracture, After not taking care of her self while caring for husband that was struggling with Dementia. She States that she has been slow to get going and gets tired extremely quickly. reports that her lower back and hips hurt constantly when on her feet.  Reports significant pain in BLE from chronic fx and truama.   PAIN:  Are you having pain? Yes: NPRS scale: 7.5/10  Pain location: lower back and hips  Pain description: "vivid" constant   Aggravating factors: walking  Relieving factors: mild improvement with tramadol, has become less affective over the last year ot 2.   PRECAUTIONS: Fall  RED FLAGS: Compression fracture: Yes: spinal surgrey in 1983    WEIGHT BEARING RESTRICTIONS: No  FALLS: Has patient fallen in last 6 months? No and a few near falls   LIVING ENVIRONMENT: Lives with: lives alone Lives in: House/apartment Stairs: Yes: Internal: 14 steps; on right going up and External: 6 steps; on right going up Has following equipment at home: Wheelchair (manual) and walking sticks.   PLOF: Independent with basic ADLs and Independent with household mobility without device  PATIENT GOALS: be on feet for longer time. Less pain.   OBJECTIVE:  Note: Objective measures were completed at Evaluation unless otherwise noted.  DIAGNOSTIC FINDINGS:   CT cervical spine    IMPRESSION: Moderate to advanced degenerative disc and facet disease.   No acute  bony abnormality.    COGNITION: Overall cognitive status: Within functional limits for tasks assessed   SENSATION: WFL  COORDINATION: WFL with testing. Mild staggering of gait.   EDEMA:  Mild edema in Bil hands and L   MUSCLE TONE: WFL    POSTURE: rounded shoulders, forward head, and decreased lumbar lordosis  LOWER EXTREMITY ROM:     Grossly WFL.  Limited trunk and lumbar extension to 5 deg.  Trunk flexion WFL.  Mild instability with trunk rotation, but grossly WFL ROM.    LOWER EXTREMITY MMT:    MMT Right Eval Left Eval  Hip flexion 4+ 4  Hip extension 4 4  Hip abduction 4+ 4+  Hip adduction 5 5  Hip internal rotation    Hip external rotation    Knee flexion 4+ 4+  Knee extension 5 5  Ankle dorsiflexion 4+ 4  Ankle plantarflexion    Ankle inversion    Ankle eversion    (Blank rows = not tested)  BED MOBILITY:  Sit to supine Complete Independence Supine to sit Complete Independence  TRANSFERS: Assistive device utilized: None  Sit to stand: SBA Stand to sit: SBA Chair to chair: SBA Floor:  not assessed     CURB:  Level of Assistance: CGA Assistive device utilized:  rail Curb Comments:   STAIRS: Level of Assistance: CGA Stair Negotiation Technique: Step to Pattern with Bilateral Rails Number of Stairs: 4  Height of Stairs: 6  Comments:   GAIT: Gait pattern:  occasional staggering, step through pattern, and narrow BOS Distance walked: 60 Assistive device utilized: None Level of assistance: SBA Comments: staggering in turn and on first bout of  FUNCTIONAL TESTS:  5 times sit to stand: 13.78 Timed up and go (TUG): 10.28sec  6 minute walk test: To be completed  10 meter walk test: 11.86, 13.21sec (0.43m/s) Berg Balance Scale: to be completed  Functional gait assessment: to be completed   PATIENT SURVEYS:  ABC scale 93.375                                                                                                                               TREATMENT DATE: 04/12/2023   Unless otherwise stated, CGA/SBA was provided and gait belt donned in order to ensure pt safety throughout session.  TA:  Ambulation ~170' with rollator for cardiorespiratory endurance.  Sit to stand  x 10 with no UE support - x 10 with RTB around knees to prevent genu valgum  Seated hip abduction with RTB around knees 2 x 10   NMR -Standing on airex pad with narrow BOS x 30 seconds   -addition of horizontal head turns x 30 seconds  -addition of vertical head turns x 30 seconds  -6" step taps x 10 each LE with no UE support   Kore balance trainer working on patient's proprioception and standing balance on dynamic surface  Setting type(s): tux racer bunny slope  Reps:  3 Comments: good ability to adapt to surface, UE use required     PATIENT EDUCATION: Education details: POC Person educated: Patient Education method: Explanation Education comprehension: verbalized understanding  HOME EXERCISE PROGRAM:  Access Code: [email or web address] URL: [email or web address] Date: 03/29/2023 Prepared by: Casimiro Needle  Exercises - Sit to Stand with Arms Crossed  - 1 x daily - 7 x weekly - 2 sets - 10 reps - Standing March with Counter Support  - 1 x daily - 7 x weekly - 2 sets - 10 reps - Standing Romberg to 3/4 Tandem Stance  - 1 x daily - 7 x weekly - 2 sets - 30 seconds hold   GOALS: Goals reviewed with patient? Yes   SHORT TERM GOALS: Target date: 04/09/2023   Patient will be independent in home exercise program to improve strength/mobility for better functional independence with ADLs. Baseline: to be given on visit 2  03/29/2023: provided initial HEP Goal status: INITIAL   LONG TERM GOALS: Target date: 06/04/2023    Patient will increase ABC score  by equal to or greater 3points   to demonstrate statistically significant improvement in  mobility and quality of life.  Baseline: 93.375% Goal status: INITIAL  2.  Patient (> 49 years old) will complete five times sit to stand test in < 12 seconds indicating an increased LE strength and improved balance. Baseline: 13.78 Goal status: INITIAL  3.  Patient will increase Berg Balance score by > 6 points to demonstrate decreased fall risk during functional activities Baseline: 03/29/2023: 39/56 Goal status: INITIAL  4.  Patient will increase 10 meter walk test to >1.25m/s as to improve gait speed for better community ambulation and to reduce fall risk. Baseline: 0.23m/s Goal status: INITIAL  5.  Patient will increase 6 min walk test  by 145ft to demonstrate improved balance and tolerance to standing, allowing improved community access and independence.  Baseline: 03/31/2023: 475 feet *had to stop at due to onset of pain in bilateral hips* Goal status: INITIAL  6.  Patient will increase FGA score to >/ 24 as to demonstrate reduced fall risk and improved dynamic gait balance for better safety with community/home ambulation. Baseline: 03/29/2023: 12/30 Goal status: INITIAL   ASSESSMENT:  CLINICAL IMPRESSION:   Patient is a 72y.o. female who was seen today for physical therapy treatment for impaired balance, B LE weakness, impaired endurance, impaired motor planning, and gait deficits due to multiple comorbidities and hx of many injuries. Patient challenged with standing on unlevel surface with addition of head turns. Complaining of back pain throughout session. Self limiting at times. Will benefit from continued skilled PT to address strength and balance deficits as well as address pain in low back to improve overall QoL.  OBJECTIVE IMPAIRMENTS: Abnormal gait, cardiopulmonary status limiting activity, decreased activity tolerance, decreased balance, decreased coordination, decreased endurance, decreased knowledge of use of DME, decreased mobility, difficulty walking, decreased  strength, decreased safety awareness, hypomobility, increased fascial restrictions, and impaired flexibility.   ACTIVITY LIMITATIONS: carrying, lifting, bending, sitting, stairs, and locomotion level  PARTICIPATION LIMITATIONS: cleaning, laundry, driving, shopping, and community activity  PERSONAL FACTORS: Age and 1 comorbidity: hx of back fx and surgery   are also affecting patient's functional outcome.   REHAB POTENTIAL: Good  CLINICAL DECISION MAKING: Stable/uncomplicated  EVALUATION COMPLEXITY: Moderate  PLAN:  PT FREQUENCY: 1-2x/week  PT DURATION: 12 weeks  PLANNED INTERVENTIONS: 97110-Therapeutic exercises, 97530- Therapeutic activity, [id number]- Neuromuscular re-education, 97535- Self Care, [id number]- Manual therapy, [id number]- Gait training, [id number]-  Aquatic Therapy, Patient/Family education, Balance training, Stair training, Taping, Dry Needling, Joint mobilization, Joint manipulation, DME instructions, Cryotherapy, and Moist heat  PLAN FOR NEXT SESSION:  - follow-up on aquatic therapy interest - follow up on when she is starting  - progress HEP when appropriate - dynamic balance including:   -  step-ups/alternating foot taps  - single leg stance  - eyes closed - dynamic gait training including:  - horizontal head turns - obstacle navigation - change in gait speed   Maylon Peppers, PT, DPT  Physical Therapist- [place]  [facility]  1:10 PM 04/12/23

## 2023-04-12 NOTE — Progress Notes (Signed)
 Transfer of pharamcy. aw

## 2023-04-12 NOTE — Progress Notes (Signed)
 Patient here today for Dupixent injection for severe atopic dermatitis.    Dupixent Syringe 300mg  injected into right lower abdomen. Patient tolerated well.   LOT: [id number]   EXP: 08/12/2024 NDC: [id number]   Dorathy Daft, RMA

## 2023-04-14 ENCOUNTER — Ambulatory Visit: Payer: Medicare Other | Admitting: Physical Therapy

## 2023-04-19 ENCOUNTER — Ambulatory Visit: Payer: Medicare Other | Attending: Internal Medicine

## 2023-04-19 DIAGNOSIS — M6281 Muscle weakness (generalized): Secondary | ICD-10-CM | POA: Diagnosis present

## 2023-04-19 DIAGNOSIS — R269 Unspecified abnormalities of gait and mobility: Secondary | ICD-10-CM | POA: Insufficient documentation

## 2023-04-19 DIAGNOSIS — R2681 Unsteadiness on feet: Secondary | ICD-10-CM | POA: Insufficient documentation

## 2023-04-19 DIAGNOSIS — M25552 Pain in left hip: Secondary | ICD-10-CM | POA: Insufficient documentation

## 2023-04-19 DIAGNOSIS — R600 Localized edema: Secondary | ICD-10-CM | POA: Insufficient documentation

## 2023-04-19 DIAGNOSIS — M5459 Other low back pain: Secondary | ICD-10-CM | POA: Insufficient documentation

## 2023-04-19 DIAGNOSIS — R262 Difficulty in walking, not elsewhere classified: Secondary | ICD-10-CM | POA: Insufficient documentation

## 2023-04-19 DIAGNOSIS — M79672 Pain in left foot: Secondary | ICD-10-CM | POA: Diagnosis present

## 2023-04-19 DIAGNOSIS — M25551 Pain in right hip: Secondary | ICD-10-CM | POA: Diagnosis present

## 2023-04-19 NOTE — Therapy (Signed)
 OUTPATIENT PHYSICAL THERAPY NEURO TREATMENT   Patient Name: Suzanne Snyder MRN: [id number] DOB:[birth year], 73 y.o., female Today's Date: 04/19/2023   PCP: Gracelyn Nurse, MD  REFERRING PROVIDER:   Gracelyn Nurse, MD    END OF SESSION:   PT End of Session - 04/19/23 1624     Visit Number 6    Number of Visits 24    Date for PT Re-Evaluation 06/03/23    PT Start Time 1627    PT Stop Time 1655    PT Time Calculation (min) 28 min    Equipment Utilized During Treatment Gait belt    Activity Tolerance Patient tolerated treatment well    Behavior During Therapy WFL for tasks assessed/performed                 Past Medical History:  Diagnosis Date   Anxiety    Arthritis    Asthma    Diabetes mellitus without complication (HCC)    Insulin pump in place    Osteoporosis    RLS (restless legs syndrome)    Smokers' cough (HCC)    Wears dentures    full upper and lower   Past Surgical History:  Procedure Laterality Date   CARPAL TUNNEL RELEASE Left 11/20/2022   Procedure: CARPAL TUNNEL RELEASE;  Surgeon: Reinaldo Berber, MD;  Location: [facility];  Service: Orthopedics;  Laterality: Left;   CATARACT EXTRACTION W/PHACO Right 02/20/2020   Procedure: CATARACT EXTRACTION PHACO AND INTRAOCULAR LENS PLACEMENT (IOC) RIGHT 5.60 00:36.3;  Surgeon: Galen Manila, MD;  Location: [facility];  Service: Ophthalmology;  Laterality: Right;  Diabetic - insulin pump Requests not first   CATARACT EXTRACTION W/PHACO Left 03/05/2020   Procedure: CATARACT EXTRACTION PHACO AND INTRAOCULAR LENS PLACEMENT (IOC) LEFT 4.94 00:31.6;  Surgeon: Galen Manila, MD;  Location: [facility];  Service: Ophthalmology;  Laterality: Left;   HIP PINNING,CANNULATED Right 02/09/2022   Procedure: PERCUTANEOUS FIXATION OF FEMORAL NECK;  Surgeon: Deeann Saint, MD;  Location: [facility];  Service: Orthopedics;  Laterality: Right;   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   ROTATOR CUFF  REPAIR Left 2014 and 2015   SPINE SURGERY N/A 1984   L4-5, not sure of procedure   THYROID SURGERY     Patient Active Problem List   Diagnosis Date Noted   Generalized anxiety disorder 11/29/2022   RLS (restless legs syndrome) 11/29/2022   Acute metabolic encephalopathy 11/19/2022   Pain and swelling of left wrist 11/19/2022   Fall 11/19/2022   Suppurative tenosynovitis of flexor tendon of left hand 11/19/2022   Rib pain on left Snyder 08/25/2022   SBO (small bowel obstruction) (HCC) 05/06/2022   Small bowel obstruction (HCC) 05/06/2022   Portal vein obstruction 05/06/2022   Right hip pain 02/14/2022   Hip pain, acute, right 02/13/2022   Electrolyte abnormality 02/13/2022   Anemia 02/13/2022   Malnutrition of moderate degree 02/10/2022   Closed right hip fracture (HCC) 02/08/2022   Hypomagnesemia 02/08/2022   Cellulitis of right ankle 02/08/2022   Hyponatremia 02/08/2022   Tobacco abuse 02/08/2022   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) 02/08/2022   Lumbar spondylosis 11/27/2021   Lumbar degenerative disc disease 11/27/2021   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/27/2021   Chronic left shoulder pain 11/27/2021   Cervical facet joint syndrome 11/27/2021   Cervical radicular pain 11/27/2021   Chronic pain syndrome 11/27/2021   Cellulitis of right wrist 12/21/2020   Cellulitis of right thigh  12/11/2020   Hypoglycemia 12/10/2020    ONSET DATE: >1year   REFERRING DIAG:  Diagnosis  R26.81 (ICD-10-CM) - Gait instability    THERAPY DIAG:  Unsteadiness on feet  Muscle weakness (generalized)  Difficulty in walking, not elsewhere classified  Rationale for Evaluation and Treatment: Rehabilitation  SUBJECTIVE:                                                                                                                                                                                             SUBJECTIVE STATEMENT:  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 She reports she experienced a lot of hip pain, low back, L shoulder pain, and neck pain yesterday following moving boxes. She is not feeling good today. She reports no stumbles/falls.    Pt accompanied by: self and daughter, Suzanne Snyder  PERTINENT HISTORY:  PMH anxiety, arthritis, DM, osteoporosis RLS, hx L total hip 2007, RTC repair L 2014 and 2015, spine surgery 1984. Pt arrived late to scheduled evaluation and is extremely verbose and tangential throughout evaluation.  Recently in ED 12/10/22 with low blood sugar, reported at time she had not been eating much. Pt reports that in 2023 sustained femur fracture, After not taking care of her self while caring for husband that was struggling with Dementia. She States that she has been slow to get going and gets tired extremely quickly. reports that her lower back and hips hurt constantly when on her feet.  Reports significant pain in BLE from chronic fx and truama.   PAIN:  Are you having pain? Yes: NPRS scale: 7.5/10  Pain location: lower back and hips  Pain description: "vivid" constant   Aggravating factors: walking  Relieving factors: mild improvement with tramadol, has become less affective over the last year ot 2.   PRECAUTIONS: Fall  RED FLAGS: Compression fracture: Yes: spinal surgrey in 1983    WEIGHT BEARING RESTRICTIONS: No  FALLS: Has patient fallen in last 6 months? No and a few near falls   LIVING ENVIRONMENT: Lives with: lives alone Lives in: House/apartment Stairs: Yes: Internal: 14 steps; on right going up and External: 6 steps; on right going up Has following equipment at home: Wheelchair (manual) and walking sticks.   PLOF: Independent with basic ADLs and Independent with household mobility without device  PATIENT GOALS: be on feet for longer time. Less pain.   OBJECTIVE:  Note: Objective measures were completed at Evaluation unless otherwise  noted.  DIAGNOSTIC FINDINGS:   CT cervical spine   IMPRESSION: Moderate to advanced degenerative disc and facet disease.   No acute  bony abnormality.    COGNITION: Overall cognitive status: Within functional limits for tasks assessed   SENSATION: WFL  COORDINATION: WFL with testing. Mild staggering of gait.   EDEMA:  Mild edema in Bil hands and L   MUSCLE TONE: WFL    POSTURE: rounded shoulders, forward head, and decreased lumbar lordosis  LOWER EXTREMITY ROM:     Grossly WFL.  Limited trunk and lumbar extension to 5 deg.  Trunk flexion WFL.  Mild instability with trunk rotation, but grossly WFL ROM.    LOWER EXTREMITY MMT:    MMT Right Eval Left Eval  Hip flexion 4+ 4  Hip extension 4 4  Hip abduction 4+ 4+  Hip adduction 5 5  Hip internal rotation    Hip external rotation    Knee flexion 4+ 4+  Knee extension 5 5  Ankle dorsiflexion 4+ 4  Ankle plantarflexion    Ankle inversion    Ankle eversion    (Blank rows = not tested)  BED MOBILITY:  Sit to supine Complete Independence Supine to sit Complete Independence  TRANSFERS: Assistive device utilized: None  Sit to stand: SBA Stand to sit: SBA Chair to chair: SBA Floor:  not assessed     CURB:  Level of Assistance: CGA Assistive device utilized:  rail Curb Comments:   STAIRS: Level of Assistance: CGA Stair Negotiation Technique: Step to Pattern with Bilateral Rails Number of Stairs: 4  Height of Stairs: 6  Comments:   GAIT: Gait pattern:  occasional staggering, step through pattern, and narrow BOS Distance walked: 60 Assistive device utilized: None Level of assistance: SBA Comments: staggering in turn and on first bout of  FUNCTIONAL TESTS:  5 times sit to stand: 13.78 Timed up and go (TUG): 10.28sec  6 minute walk test: To be completed  10 meter walk test: 11.86, 13.21sec (0.77m/s) Berg Balance Scale: to be completed  Functional gait assessment: to be completed    PATIENT SURVEYS:  ABC scale 93.375                                                                                                                              TREATMENT DATE: 04/19/2023   Unless otherwise stated, CGA/SBA was provided and gait belt donned in order to ensure pt safety throughout session.  TE Nustep level 1 x 5 min LE only   NMR: Actuary working on patients proprioception and standing balance on dynamic surface  Setting type(s): tux racer bunny slope , stability lvl 4  Reps: 4 Comments: with decreasing levels of UE support to no UE support   At support surface - dynamic balance including:   -  alternating foot taps x with decreasing levels of UE support to no UE support x multiple reps - rates easy   - single leg stance 2 x 30 sec ea LE - intermittent UE support  Standing in corner EC x30 sec, then progressed to completing  with horizontal head turns 10x    PT took pt back up to lobby in transport chair at end of session   PATIENT EDUCATION: Education details: exercise technique  Person educated: Patient Education method: Explanation Education comprehension: verbalized understanding  HOME EXERCISE PROGRAM:  Access Code: [email or web address] URL: [email or web address] Date: 03/29/2023 Prepared by: Casimiro Needle  Exercises - Sit to Stand with Arms Crossed  - 1 x daily - 7 x weekly - 2 sets - 10 reps - Standing March with Counter Support  - 1 x daily - 7 x weekly - 2 sets - 10 reps - Standing Romberg to 3/4 Tandem Stance  - 1 x daily - 7 x weekly - 2 sets - 30 seconds hold   GOALS: Goals reviewed with patient? Yes   SHORT TERM GOALS: Target date: 04/09/2023   Patient will be independent in home exercise program to improve strength/mobility for better functional independence with ADLs. Baseline: to be given on visit 2  03/29/2023: provided initial HEP Goal status: INITIAL   LONG TERM GOALS: Target date: 06/04/2023    Patient will  increase ABC score  by equal to or greater 3points   to demonstrate statistically significant improvement in mobility and quality of life.  Baseline: 93.375% Goal status: INITIAL  2.  Patient (> 56 years old) will complete five times sit to stand test in < 12 seconds indicating an increased LE strength and improved balance. Baseline: 13.78 Goal status: INITIAL  3.  Patient will increase Berg Balance score by > 6 points to demonstrate decreased fall risk during functional activities Baseline: 03/29/2023: 39/56 Goal status: INITIAL  4.  Patient will increase 10 meter walk test to >1.19m/s as to improve gait speed for better community ambulation and to reduce fall risk. Baseline: 0.52m/s Goal status: INITIAL  5.  Patient will increase 6 min walk test  by 128ft to demonstrate improved balance and tolerance to standing, allowing improved community access and independence.  Baseline: 03/31/2023: 475 feet *had to stop at due to onset of pain in bilateral hips* Goal status: INITIAL  6.  Patient will increase FGA score to >/ 24 as to demonstrate reduced fall risk and improved dynamic gait balance for better safety with community/home ambulation. Baseline: 03/29/2023: 12/30 Goal status: INITIAL   ASSESSMENT:  CLINICAL IMPRESSION:  Session limited secondary to pt late arrival. Author continued plan as laid out in recent sessions and pt tolerated interventions well without increased pain. She was most challenged by [facility] horizontal head turns. The pt will benefit from continued skilled PT to address strength and balance deficits as well as address pain in low back to improve overall QoL.  OBJECTIVE IMPAIRMENTS: Abnormal gait, cardiopulmonary status limiting activity, decreased activity tolerance, decreased balance, decreased coordination, decreased endurance, decreased knowledge of use of DME, decreased mobility, difficulty walking, decreased strength, decreased safety awareness, hypomobility,  increased fascial restrictions, and impaired flexibility.   ACTIVITY LIMITATIONS: carrying, lifting, bending, sitting, stairs, and locomotion level  PARTICIPATION LIMITATIONS: cleaning, laundry, driving, shopping, and community activity  PERSONAL FACTORS: Age and 1 comorbidity: hx of back fx and surgery   are also affecting patient's functional outcome.   REHAB POTENTIAL: Good  CLINICAL DECISION MAKING: Stable/uncomplicated  EVALUATION COMPLEXITY: Moderate  PLAN:  PT FREQUENCY: 1-2x/week  PT DURATION: 12 weeks  PLANNED INTERVENTIONS: 97110-Therapeutic exercises, 97530- Therapeutic activity, [id number]- Neuromuscular re-education, 97535- Self Care, [id number]- Manual therapy, [phone]- Gait training, [phone]- Aquatic Therapy, Patient/Family education, Balance training, Stair training, Taping, Dry Needling, Joint mobilization,  Joint manipulation, DME instructions, Cryotherapy, and Moist heat  PLAN FOR NEXT SESSION:  - follow-up on aquatic therapy interest - follow up on when she is starting  - progress HEP when appropriate - dynamic balance including:   -  step-ups/alternating foot taps  - single leg stance  - eyes closed - dynamic gait training including:  - horizontal head turns - obstacle navigation - change in gait speed    Baird Kay PT ,DPT Physical Therapist- [facility]  5:07 PM 04/19/23

## 2023-04-20 ENCOUNTER — Ambulatory Visit
Attending: Student in an Organized Health Care Education/Training Program | Admitting: Student in an Organized Health Care Education/Training Program

## 2023-04-20 ENCOUNTER — Encounter: Payer: Self-pay | Admitting: Student in an Organized Health Care Education/Training Program

## 2023-04-20 VITALS — BP 147/64 | HR 69 | Temp 97.0°F | Resp 16 | Ht 68.0 in | Wt 113.0 lb

## 2023-04-20 DIAGNOSIS — G894 Chronic pain syndrome: Secondary | ICD-10-CM | POA: Diagnosis not present

## 2023-04-20 DIAGNOSIS — M47816 Spondylosis without myelopathy or radiculopathy, lumbar region: Secondary | ICD-10-CM | POA: Diagnosis present

## 2023-04-20 DIAGNOSIS — M25551 Pain in right hip: Secondary | ICD-10-CM | POA: Insufficient documentation

## 2023-04-20 DIAGNOSIS — Z96612 Presence of left artificial shoulder joint: Secondary | ICD-10-CM | POA: Diagnosis present

## 2023-04-20 DIAGNOSIS — Z0289 Encounter for other administrative examinations: Secondary | ICD-10-CM | POA: Diagnosis present

## 2023-04-20 MED ORDER — TRAMADOL HCL 50 MG PO TABS: 50.0000 mg | ORAL_TABLET | Freq: Four times a day (QID) | ORAL | 0 refills | Status: DC | PRN

## 2023-04-20 MED ORDER — TRAMADOL HCL 50 MG PO TABS: 50.0000 mg | ORAL_TABLET | Freq: Four times a day (QID) | ORAL | 2 refills | Status: AC | PRN

## 2023-04-20 NOTE — Progress Notes (Signed)
 Nursing Pain Medication Assessment:  Safety precautions to be maintained throughout the outpatient stay will include: orient to surroundings, keep bed in low position, maintain call bell within reach at all times, provide assistance with transfer out of bed and ambulation.  Medication Inspection Compliance: Ms. Craker did not comply with our request to bring her pills to be counted. She was reminded that bringing the medication bottles, even when empty, is a requirement.  Medication: None brought in. Pill/Patch Count: None available to be counted. Bottle Appearance: No container available. Did not bring bottle(s) to appointment. Filled Date: N/A Last Medication intake:  Ran out of medicine more than 48 hours ago

## 2023-04-20 NOTE — Progress Notes (Signed)
 PROVIDER NOTE: Interpretation of information contained herein should be left to medically-trained personnel. Specific patient instructions are provided elsewhere under "Patient Instructions" section of medical record. This document was created in part using AI and STT-dictation technology, any transcriptional errors that may result from this process are unintentional.  Patient: Suzanne Snyder  Service: E/M   PCP: Gracelyn Nurse, MD  DOB: May 19, 1950  DOS: 04/20/2023  Provider: Edward Jolly, MD  MRN: [id number]  Delivery: Face-to-face  Specialty: Interventional Pain Management  Type: Established Patient  Setting: Ambulatory outpatient facility  Specialty designation: 09  Referring Prov.: Gracelyn Nurse, MD  Location: Outpatient office facility       HPI  [name], a 73 y.o. year old female, is here today because of her Chronic pain syndrome [G89.4]. Suzanne Snyder primary complain today is Hip Pain (Bilateral s/p joint replacement on both.  In PT currently and hips are preventing her from doing all that they ask her to do.  ), Back Pain (Lumbar bilateral.  Reports pain in entire back but lower is worse ), Hand Pain (Left s/p surgery ), and Shoulder Pain (Left s/p joint replacement )  Pertinent problems: Suzanne Snyder has Lumbar facet arthropathy; Lumbar degenerative disc disease; History of left shoulder replacement; Chronic left shoulder pain; Cervical facet joint syndrome; Cervical radicular pain; Chronic pain syndrome; and Pain management contract signed on their pertinent problem list. Pain Assessment: Severity of Chronic pain is reported as a 8 /10. Location: Hip Left, Right/denies. Onset: More than a month ago. Quality: Discomfort, Constant. Timing: Constant. Modifying factor(s): nothing currently. Vitals:  height is 5\' 8"  (1.727 m) and weight is 113 lb (51.3 kg). Her temporal temperature is 97 F (36.1 C) (abnormal). Her blood pressure is 147/64 (abnormal) and her pulse is 69. Her  respiration is 16 and oxygen saturation is 100%.  BMI: Estimated body mass index is 17.18 kg/m as calculated from the following:   Height as of this encounter: 5\' 8"  (1.727 m).   Weight as of this encounter: 113 lb (51.3 kg). Last encounter: 08/25/2022. Last procedure: Visit date not found.  Reason for encounter: medication management.   The patient reports Bilateral hip pain and states that PT did not provide relief. The ongoing pain is interfering with her ability to perform daily activities.  Patient currently taking tramadol 50 mg every 6 hours as needed for severe pain. Her last prescription for tramadol from [facility] pain clinic was 10/16/2022.  She has received other prescriptions from orthopedics.  We clarify that if we will be taking over her opioid regimen that all medications specifically opioid analgesics need to come from here.  She renewed her pain contract.  Pharmacotherapy Assessment  Analgesic: Tramadol (Ultram) 50 mg tablet every 6 hours as needed for severe pain  Monitoring: [place] PMP: PDMP reviewed during this encounter.       Pharmacotherapy: No side-effects or adverse reactions reported. Compliance: No problems identified. Effectiveness: Clinically acceptable.  Vernie Ammons, RN  72/08/2023 11:41 AM  Sign when Signing Visit Nursing Pain Medication Assessment:  Safety precautions to be maintained throughout the outpatient stay will include: orient to surroundings, keep bed in low position, maintain call bell within reach at all times, provide assistance with transfer out of bed and ambulation.  Medication Inspection Compliance: Suzanne Snyder did not comply with our request to bring her pills to be counted. She was reminded that bringing the medication bottles, even when empty, is a requirement.  Medication: None  brought in. Pill/Patch Count: None available to be counted. Bottle Appearance: No container available. Did not bring bottle(s) to appointment. Filled Date:  N/A Last Medication intake:  Ran out of medicine more than 48 hours ago    No results found for: "CBDTHCR" No results found for: "D8THCCBX" No results found for: "D9THCCBX"  UDS:  Summary  Date Value Ref Range Status  11/27/2021 Note  Final    Comment:    ==================================================================== Compliance Drug Analysis, Ur ==================================================================== Test                             Result       Flag       Units  Drug Present and Declared for Prescription Verification   7-aminoclonazepam              267          EXPECTED   ng/mg creat    7-aminoclonazepam is an expected metabolite of clonazepam. Source of    clonazepam is a scheduled prescription medication.    Gabapentin                     PRESENT      EXPECTED   Cyclobenzaprine                PRESENT      EXPECTED   Desmethylcyclobenzaprine       PRESENT      EXPECTED    Desmethylcyclobenzaprine is an expected metabolite of    cyclobenzaprine.  Drug Present not Declared for Prescription Verification   Tramadol                       6697         UNEXPECTED ng/mg creat   O-Desmethyltramadol            3727         UNEXPECTED ng/mg creat   N-Desmethyltramadol            1160         UNEXPECTED ng/mg creat    Source of tramadol is a prescription medication. O-desmethyltramadol    and N-desmethyltramadol are expected metabolites of tramadol.    Acetaminophen                  PRESENT      UNEXPECTED  Drug Absent but Declared for Prescription Verification   Doxepin                        Not Detected UNEXPECTED   Salicylate                     Not Detected UNEXPECTED    Aspirin, as indicated in the declared medication list, is not always    detected even when used as directed.    Diphenhydramine                Not Detected UNEXPECTED   Hydroxyzine                    Not Detected  UNEXPECTED ==================================================================== Test                      Result    Flag   Units      Ref Range   Creatinine  30               mg/dL      >=54 ==================================================================== Declared Medications:  The flagging and interpretation on this report are based on the  following declared medications.  Unexpected results may arise from  inaccuracies in the declared medications.   **Note: The testing scope of this panel includes these medications:   Clonazepam (Klonopin)  Cyclobenzaprine (Flexeril)  Diphenhydramine  Doxepin (Sinequan)  Gabapentin (Neurontin)  Hydroxyzine (Atarax)   **Note: The testing scope of this panel does not include small to  moderate amounts of these reported medications:   Aspirin   **Note: The testing scope of this panel does not include the  following reported medications:   Albuterol (Ventolin HFA)  Ammonium Hydroxide (Ammonium Lactate)  Celecoxib (Celebrex)  Clobetasol (Temovate)  Fish Oil  Insulin (NovoLog)  Ipratropium (Atrovent)  Lactic Acid (Ammonium Lactate)  Mometasone  Multivitamin  Ropinirole (Requip)  Triamcinolone (Kenalog)  Vitamin D2 (Drisdol) ==================================================================== For clinical consultation, please call [phone]. ====================================================================       ROS  Constitutional: Denies any fever or chills Gastrointestinal: No reported hemesis, hematochezia, vomiting, or acute GI distress Musculoskeletal: Bilateral Hip pain Neurological: No reported episodes of acute onset apraxia, aphasia, dysarthria, agnosia, amnesia, paralysis, loss of coordination, or loss of consciousness  Medication Review  Fish Oil, Multiple Vitamin, Omnipod 5 DexG7G6 Pods Gen 5, Omnipod DASH Pods (Gen 4), Vitamin D (Ergocalciferol), acetaminophen, albuterol, apixaban, clobetasol,  clonazePAM, cyclobenzaprine, denosumab, dupilumab, furosemide, gabapentin, hydrOXYzine, hydrocortisone, insulin aspart, ipratropium, iron polysaccharides, nicotine, oxyCODONE, pantoprazole, polyethylene glycol, polyvinyl alcohol, promethazine, rOPINIRole, sodium bicarbonate, terbinafine, and traMADol  History Review  Allergy: Suzanne Snyder is allergic to cat dander, pregabalin, and erythromycin. Drug: Suzanne Snyder  reports no history of drug use. Alcohol:  reports no history of alcohol use. Tobacco:  reports that she has been smoking cigarettes. She has a 50 pack-year smoking history. She has never used smokeless tobacco. Social: Suzanne Snyder  reports that she has been smoking cigarettes. She has a 50 pack-year smoking history. She has never used smokeless tobacco. She reports that she does not drink alcohol and does not use drugs. Medical:  has a past medical history of Anxiety, Arthritis, Asthma, Diabetes mellitus without complication (HCC), Insulin pump in place, Osteoporosis, RLS (restless legs syndrome), Smokers' cough (HCC), and Wears dentures. Surgical: Suzanne Snyder  has a past surgical history that includes Joint replacement (Left, 2007); Spine surgery (N/A, 1984); Rotator cuff repair (Left, 2014 and 2015); Thyroid surgery; Cataract extraction w/PHACO (Right, 02/20/2020); Cataract extraction w/PHACO (Left, 03/05/2020); Hip pinning, cannulated (Right, 02/09/2022); and Carpal tunnel release (Left, 11/20/2022). Family: family history includes Parkinsonism in her father.  Laboratory Chemistry Profile   Renal Lab Results  Component Value Date   BUN 38 (H) 12/09/2022   CREATININE 1.11 (H) 12/09/2022   GFRAA >60 01/19/2018   GFRNONAA 53 (L) 12/09/2022    Hepatic Lab Results  Component Value Date   AST 38 12/09/2022   ALT 33 12/09/2022   ALBUMIN 4.7 12/09/2022   ALKPHOS 78 12/09/2022   LIPASE 18 12/09/2022   AMMONIA 32 11/19/2022    Electrolytes Lab Results  Component Value Date   NA 137 12/09/2022    K 4.0 12/09/2022   CL 107 12/09/2022   CALCIUM 9.3 12/09/2022   MG 1.5 (L) 02/17/2022   PHOS 2.5 11/23/2022    Bone Lab Results  Component Value Date   VD25OH 25.43 (L) 11/23/2022    Inflammation (CRP: Acute Phase) (  ESR: Chronic Phase) Lab Results  Component Value Date   CRP 1.0 (H) 11/29/2022   ESRSEDRATE 34 (H) 11/29/2022   LATICACIDVEN 1.8 12/01/2022         Note: Above Lab results reviewed.  Recent Imaging Review  MR HAND LEFT WO CONTRAST CLINICAL DATA:  Septic arthritis suspected, hand, xray done. Increased swelling after surgical debridement 2 days ago  EXAM: MRI OF THE LEFT HAND WITHOUT CONTRAST; MRI OF THE LEFT WRIST WITHOUT CONTRAST  TECHNIQUE: Multiplanar, multisequence MR imaging of the left hand and left wrist was performed. No intravenous contrast was administered.  COMPARISON:  X-ray 11/29/2022, MRI 11/19/2022  FINDINGS: Bones/Joint/Cartilage  Possible nondisplaced fracture of the volar aspect of the capitate (series 4, image 30-31). No additional sites of fracture are identified. No malalignment. Similar degenerative changes of the hand and wrist, most notably at the first [facility] joint. Moderate sized distal radioulnar joint effusion with internal complexity and debris. Small effusions of the radiocarpal and midcarpal joints. Small second MCP joint effusion. No definite erosion. Mild patchy bone marrow edema throughout the carpus. No confluent low T1 marrow signal.  Ligaments  No acute ligamentous injury.  Muscles and Tendons  Generalized intramuscular edema of the hand and distal forearm musculature. No organized intramuscular fluid collection. Postsurgical changes from carpal tunnel release. Moderate flexor tenosynovitis of the wrist and hand, mildly improved compared to the prior MRI. Mild extensor carpi ulnaris tenosynovitis. Otherwise, no significant extensor tenosynovitis.  Soft tissues  Extensive subcutaneous edema of the hand,  more pronounced dorsally. No drainable fluid collections identified on noncontrast sequences.  IMPRESSION: 1. Moderate sized distal radioulnar joint effusion with internal complexity and debris. Small effusions of the radiocarpal and midcarpal joints. Small second MCP joint effusion. Findings are nonspecific and may be reactive or represent septic arthritis. 2. Moderate flexor tenosynovitis of the wrist and hand, mildly improved compared to the prior MRI. 3. Mild extensor carpi ulnaris tenosynovitis. 4. Extensive subcutaneous edema of the hand, more pronounced dorsally. No drainable fluid collections identified on noncontrast sequences. 5. Generalized intramuscular edema of the hand and distal forearm musculature compatible with myositis. No organized intramuscular fluid collection. 6. Possible nondisplaced fracture of the volar aspect of the capitate.  Electronically Signed   By: Duanne Guess D.O.   On: 11/29/2022 09:26 MR WRIST LEFT WO CONTRAST CLINICAL DATA:  Septic arthritis suspected, hand, xray done. Increased swelling after surgical debridement 2 days ago  EXAM: MRI OF THE LEFT HAND WITHOUT CONTRAST; MRI OF THE LEFT WRIST WITHOUT CONTRAST  TECHNIQUE: Multiplanar, multisequence MR imaging of the left hand and left wrist was performed. No intravenous contrast was administered.  COMPARISON:  X-ray 11/29/2022, MRI 11/19/2022  FINDINGS: Bones/Joint/Cartilage  Possible nondisplaced fracture of the volar aspect of the capitate (series 4, image 30-31). No additional sites of fracture are identified. No malalignment. Similar degenerative changes of the hand and wrist, most notably at the first [facility] joint. Moderate sized distal radioulnar joint effusion with internal complexity and debris. Small effusions of the radiocarpal and midcarpal joints. Small second MCP joint effusion. No definite erosion. Mild patchy bone marrow edema throughout the carpus. No confluent low T1  marrow signal.  Ligaments  No acute ligamentous injury.  Muscles and Tendons  Generalized intramuscular edema of the hand and distal forearm musculature. No organized intramuscular fluid collection. Postsurgical changes from carpal tunnel release. Moderate flexor tenosynovitis of the wrist and hand, mildly improved compared to the prior MRI. Mild extensor carpi ulnaris tenosynovitis. Otherwise, no significant  extensor tenosynovitis.  Soft tissues  Extensive subcutaneous edema of the hand, more pronounced dorsally. No drainable fluid collections identified on noncontrast sequences.  IMPRESSION: 1. Moderate sized distal radioulnar joint effusion with internal complexity and debris. Small effusions of the radiocarpal and midcarpal joints. Small second MCP joint effusion. Findings are nonspecific and may be reactive or represent septic arthritis. 2. Moderate flexor tenosynovitis of the wrist and hand, mildly improved compared to the prior MRI. 3. Mild extensor carpi ulnaris tenosynovitis. 4. Extensive subcutaneous edema of the hand, more pronounced dorsally. No drainable fluid collections identified on noncontrast sequences. 5. Generalized intramuscular edema of the hand and distal forearm musculature compatible with myositis. No organized intramuscular fluid collection. 6. Possible nondisplaced fracture of the volar aspect of the capitate.  Electronically Signed   By: Duanne Guess D.O.   On: 11/29/2022 09:26 DG Hand Complete Left CLINICAL DATA:  Surgical debridement with discharge 2 days ago. Swelling.  EXAM: LEFT HAND - COMPLETE 3+ VIEW  COMPARISON:  11/19/2022  FINDINGS: Superficial punctate debris which appears to be along the skin surface/bandage of the palm. Pronounced soft tissue swelling to the dorsal hand. No underlying erosion, fracture, or soft tissue emphysema. Chondrocalcinosis.  IMPRESSION: Pronounced soft tissue swelling to the dorsal hand. No  soft tissue emphysema or underlying acute bony abnormality.  Electronically Signed   By: Tiburcio Pea M.D.   On: 11/29/2022 06:36 Note: Reviewed        Physical Exam  General appearance: alert, cooperative, and Well nourished, well developed, and well hydrated. In no apparent acute distress Mental status: Alert, oriented x 3 (person, place, & time)       Respiratory: No evidence of acute respiratory distress Eyes: PERLA Vitals: BP (!) 147/64 (BP Location: Left Arm, Patient Position: Sitting, Cuff Size: Normal)   Pulse 69   Temp (!) 97 F (36.1 C) (Temporal)   Resp 16   Ht 5\' 8"  (1.727 m)   Wt 113 lb (51.3 kg)   SpO2 100%   BMI 17.18 kg/m  BMI: Estimated body mass index is 17.18 kg/m as calculated from the following:   Height as of this encounter: 5\' 8"  (1.727 m).   Weight as of this encounter: 113 lb (51.3 kg). Ideal: Ideal body weight: 63.9 kg (140 lb 14 oz)  Assessment   Diagnosis Status  1. Chronic pain syndrome   2. Right hip pain   3. Lumbar facet arthropathy   4. Pain management contract signed   5. History of left shoulder replacement    Recurring Recurring Controlled    Plan of Care  Problem-specific:  Assessment and Plan The patient reports ports bilateral hip pain and states that physical therapy did not provide any pain relief. She has a hx of bilateral hip joint replacement. Continue with PT and Tramadol 50 mg every 6 hours as needed for pain relief.   Suzanne Snyder has a current medication list which includes the following long-term medication(s): albuterol, albuterol, clonazepam, gabapentin, insulin aspart, ipratropium, promethazine, promethazine, ropinirole, apixaban, furosemide, iron polysaccharides, and pantoprazole.  Pharmacotherapy (Medications Ordered): Meds ordered this encounter  Medications   DISCONTD: traMADol (ULTRAM) 50 MG tablet    Sig: Take 1 tablet (50 mg total) by mouth every 6 (six) hours as needed for severe pain (pain  score 7-10).    Dispense:  120 tablet    Refill:  0    Fill one day early if pharmacy is closed on scheduled refill date. Do not fill  until:  To last until:   traMADol (ULTRAM) 50 MG tablet    Sig: Take 1 tablet (50 mg total) by mouth every 6 (six) hours as needed for severe pain (pain score 7-10).    Dispense:  120 tablet    Refill:  2    Fill one day early if pharmacy is closed on scheduled refill date.   Orders:  No orders of the defined types were placed in this encounter.  Follow-up plan:   Return in about 3 months (around 07/20/2023) for (F2F), (MM) , Randalyn Rhea NP, (MM).    Recent Visits No visits were found meeting these conditions. Showing recent visits within past 90 days and meeting all other requirements Today's Visits Date Type Provider Dept  04/20/23 Office Visit Edward Jolly, MD [facility]  Showing today's visits and meeting all other requirements Future Appointments Date Type Provider Dept  07/14/23 Appointment Bettey Costa, NP [facility]  Showing future appointments within next 90 days and meeting all other requirements  I discussed the assessment and treatment plan with the patient. The patient was provided an opportunity to ask questions and all were answered. The patient agreed with the plan and demonstrated an understanding of the instructions.  Patient advised to call back or seek an in-person evaluation if the symptoms or condition worsens.  Duration of encounter: 30 minutes.  Total time on encounter, as per AMA guidelines included both the face-to-face and non-face-to-face time personally spent by the physician and/or other qualified health care professional(s) on the day of the encounter (includes time in activities that require the physician or other qualified health care professional and does not include time in activities normally performed by clinical staff). Physician's time may include the following activities when  performed: Preparing to see the patient (e.g., pre-charting review of records, searching for previously ordered imaging, lab work, and nerve conduction tests) Review of prior analgesic pharmacotherapies. Reviewing PMP Interpreting ordered tests (e.g., lab work, imaging, nerve conduction tests) Performing post-procedure evaluations, including interpretation of diagnostic procedures Obtaining and/or reviewing separately obtained history Performing a medically appropriate examination and/or evaluation Counseling and educating the patient/family/caregiver Ordering medications, tests, or procedures Referring and communicating with other health care professionals (when not separately reported) Documenting clinical information in the electronic or other health record Independently interpreting results (not separately reported) and communicating results to the patient/ family/caregiver Care coordination (not separately reported)  Note by: Edward Jolly, MD (TTS and AI technology used. I apologize for any typographical errors that were not detected and corrected.) Date: 04/20/2023; Time: 2:34 PM

## 2023-04-20 NOTE — Progress Notes (Deleted)
 Safety precautions to be maintained throughout the outpatient stay will include: orient to surroundings, keep bed in low position, maintain call bell within reach at all times, provide assistance with transfer out of bed and ambulation.

## 2023-04-21 ENCOUNTER — Ambulatory Visit: Payer: Medicare Other | Admitting: Physical Therapy

## 2023-04-26 ENCOUNTER — Ambulatory Visit: Payer: Medicare Other

## 2023-04-26 ENCOUNTER — Ambulatory Visit

## 2023-04-26 DIAGNOSIS — R262 Difficulty in walking, not elsewhere classified: Secondary | ICD-10-CM

## 2023-04-26 DIAGNOSIS — M6281 Muscle weakness (generalized): Secondary | ICD-10-CM

## 2023-04-26 DIAGNOSIS — R269 Unspecified abnormalities of gait and mobility: Secondary | ICD-10-CM

## 2023-04-26 DIAGNOSIS — L209 Atopic dermatitis, unspecified: Secondary | ICD-10-CM

## 2023-04-26 DIAGNOSIS — M5459 Other low back pain: Secondary | ICD-10-CM

## 2023-04-26 DIAGNOSIS — R2681 Unsteadiness on feet: Secondary | ICD-10-CM

## 2023-04-26 DIAGNOSIS — M79672 Pain in left foot: Secondary | ICD-10-CM

## 2023-04-26 DIAGNOSIS — R600 Localized edema: Secondary | ICD-10-CM

## 2023-04-26 NOTE — Progress Notes (Signed)
 Patient came in office today for Dupixent injection but wants to do them at home. She received her own shipment. Patient was not seen or given sample.

## 2023-04-26 NOTE — Therapy (Signed)
 OUTPATIENT PHYSICAL THERAPY NEURO TREATMENT   Patient Name: Suzanne Snyder MRN: [id number] DOB:05/15/1950, 73 y.o., female Today's Date: 04/26/2023   PCP: Gracelyn Nurse, MD  REFERRING PROVIDER:   Gracelyn Nurse, MD    END OF SESSION:    PT End of Session - 04/26/23 1619     Visit Number 7    Number of Visits 24    Date for PT Re-Evaluation 06/03/23    PT Start Time 1620    PT Stop Time 1700    PT Time Calculation (min) 40 min    Equipment Utilized During Treatment Gait belt    Activity Tolerance Patient tolerated treatment well    Behavior During Therapy WFL for tasks assessed/performed             Past Medical History:  Diagnosis Date   Anxiety    Arthritis    Asthma    Diabetes mellitus without complication (HCC)    Insulin pump in place    Osteoporosis    RLS (restless legs syndrome)    Smokers' cough (HCC)    Wears dentures    full upper and lower   Past Surgical History:  Procedure Laterality Date   CARPAL TUNNEL RELEASE Left 11/20/2022   Procedure: CARPAL TUNNEL RELEASE;  Surgeon: Reinaldo Berber, MD;  Location: [facility];  Service: Orthopedics;  Laterality: Left;   CATARACT EXTRACTION W/PHACO Right 02/20/2020   Procedure: CATARACT EXTRACTION PHACO AND INTRAOCULAR LENS PLACEMENT (IOC) RIGHT 5.60 00:36.3;  Surgeon: Galen Manila, MD;  Location: [facility];  Service: Ophthalmology;  Laterality: Right;  Diabetic - insulin pump Requests not first   CATARACT EXTRACTION W/PHACO Left 03/05/2020   Procedure: CATARACT EXTRACTION PHACO AND INTRAOCULAR LENS PLACEMENT (IOC) LEFT 4.94 00:31.6;  Surgeon: Galen Manila, MD;  Location: [facility];  Service: Ophthalmology;  Laterality: Left;   HIP PINNING,CANNULATED Right 02/09/2022   Procedure: PERCUTANEOUS FIXATION OF FEMORAL NECK;  Surgeon: Deeann Saint, MD;  Location: [facility];  Service: Orthopedics;  Laterality: Right;   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   ROTATOR CUFF REPAIR  Left 2014 and 2015   SPINE SURGERY N/A 1984   L4-5, not sure of procedure   THYROID SURGERY     Patient Active Problem List   Diagnosis Date Noted   Pain management contract signed 04/20/2023   Generalized anxiety disorder 11/29/2022   RLS (restless legs syndrome) 11/29/2022   Acute metabolic encephalopathy 11/19/2022   Pain and swelling of left wrist 11/19/2022   Fall 11/19/2022   Suppurative tenosynovitis of flexor tendon of left hand 11/19/2022   Rib pain on left side 08/25/2022   SBO (small bowel obstruction) (HCC) 05/06/2022   Small bowel obstruction (HCC) 05/06/2022   Portal vein obstruction 05/06/2022   Right hip pain 02/14/2022   Hip pain, acute, right 02/13/2022   Electrolyte abnormality 02/13/2022   Anemia 02/13/2022   Malnutrition of moderate degree 02/10/2022   Closed right hip fracture (HCC) 02/08/2022   Hypomagnesemia 02/08/2022   Cellulitis of right ankle 02/08/2022   Hyponatremia 02/08/2022   Tobacco abuse 02/08/2022   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) 02/08/2022   Lumbar facet arthropathy 11/27/2021   Lumbar degenerative disc disease 11/27/2021   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/27/2021   Chronic left shoulder pain 11/27/2021   Cervical facet joint syndrome 11/27/2021   Cervical radicular pain 11/27/2021   Chronic pain syndrome 11/27/2021   Cellulitis of right wrist 12/21/2020  Cellulitis of right thigh 12/11/2020   Hypoglycemia 12/10/2020    ONSET DATE: >1year   REFERRING DIAG:  Diagnosis  R26.81 (ICD-10-CM) - Gait instability    THERAPY DIAG:  Unsteadiness on feet  Muscle weakness (generalized)  Difficulty in walking, not elsewhere classified  Abnormality of gait and mobility  Other low back pain  Pain in left foot  Localized edema  Rationale for Evaluation and Treatment: Rehabilitation  SUBJECTIVE:                                                                                                                                                                                              SUBJECTIVE STATEMENT:   Pt reports having some increased pain upon arrival.  Pt otherwise doing fairly well.       Pt accompanied by: self and daughter, [name]  PERTINENT HISTORY:  PMH anxiety, arthritis, DM, osteoporosis RLS, hx L total hip 2007, RTC repair L 2014 and 2015, spine surgery 1984. Pt arrived late to scheduled evaluation and is extremely verbose and tangential throughout evaluation.  Recently in ED 12/10/22 with low blood sugar, reported at time she had not been eating much. Pt reports that in 2023 sustained femur fracture, After not taking care of her self while caring for husband that was struggling with Dementia. She States that she has been slow to get going and gets tired extremely quickly. reports that her lower back and hips hurt constantly when on her feet.  Reports significant pain in BLE from chronic fx and truama.   PAIN:  Are you having pain? Yes: NPRS scale: 7.5/10  Pain location: lower back and hips  Pain description: "vivid" constant   Aggravating factors: walking  Relieving factors: mild improvement with tramadol, has become less affective over the last year ot 2.   PRECAUTIONS: Fall  RED FLAGS: Compression fracture: Yes: spinal surgrey in 1983    WEIGHT BEARING RESTRICTIONS: No  FALLS: Has patient fallen in last 6 months? No and a few near falls   LIVING ENVIRONMENT: Lives with: lives alone Lives in: House/apartment Stairs: Yes: Internal: 14 steps; on right going up and External: 6 steps; on right going up Has following equipment at home: Wheelchair (manual) and walking sticks.   PLOF: Independent with basic ADLs and Independent with household mobility without device  PATIENT GOALS: be on feet for longer time. Less pain.   OBJECTIVE:  Note: Objective measures were completed at Evaluation unless otherwise noted.  DIAGNOSTIC FINDINGS:   CT cervical  spine   IMPRESSION: Moderate to advanced degenerative disc and facet disease.   No acute bony abnormality.  COGNITION: Overall cognitive status: Within functional limits for tasks assessed   SENSATION: WFL  COORDINATION: WFL with testing. Mild staggering of gait.   EDEMA:  Mild edema in Bil hands and L   MUSCLE TONE: WFL    POSTURE: rounded shoulders, forward head, and decreased lumbar lordosis  LOWER EXTREMITY ROM:     Grossly WFL.  Limited trunk and lumbar extension to 5 deg.  Trunk flexion WFL.  Mild instability with trunk rotation, but grossly WFL ROM.    LOWER EXTREMITY MMT:    MMT Right Eval Left Eval  Hip flexion 4+ 4  Hip extension 4 4  Hip abduction 4+ 4+  Hip adduction 5 5  Hip internal rotation    Hip external rotation    Knee flexion 4+ 4+  Knee extension 5 5  Ankle dorsiflexion 4+ 4  Ankle plantarflexion    Ankle inversion    Ankle eversion    (Blank rows = not tested)  BED MOBILITY:  Sit to supine Complete Independence Supine to sit Complete Independence  TRANSFERS: Assistive device utilized: None  Sit to stand: SBA Stand to sit: SBA Chair to chair: SBA Floor:  not assessed     CURB:  Level of Assistance: CGA Assistive device utilized:  rail Curb Comments:   STAIRS: Level of Assistance: CGA Stair Negotiation Technique: Step to Pattern with Bilateral Rails Number of Stairs: 4  Height of Stairs: 6  Comments:   GAIT: Gait pattern:  occasional staggering, step through pattern, and narrow BOS Distance walked: 60 Assistive device utilized: None Level of assistance: SBA Comments: staggering in turn and on first bout of  FUNCTIONAL TESTS:  5 times sit to stand: 13.78 Timed up and go (TUG): 10.28sec  6 minute walk test: To be completed  10 meter walk test: 11.86, 13.21sec (0.35m/s) Berg Balance Scale: to be completed  Functional gait assessment: to be completed   PATIENT SURVEYS:  ABC scale 93.375                                                                                                                               TREATMENT DATE: 04/26/2023  Unless otherwise stated, CGA/SBA was provided and gait belt donned in order to ensure pt safety throughout session.  TE  Nustep level 2 x 5 min LE only with frequent rest breaks in between the total 5 minutes. STS 2x10 Seated LAQ with 3# AW, 2x10 each LE Seated hamstring curls with GTB, 2x10 each LE Ambulation around the gym, 2x10    NMR:  [place]ore balance trainer working on patients proprioception and standing balance on dynamic surface  Setting type(s): tux racer bunny slope , stability lvl 4  Reps: 2 Comments: with decreasing levels of UE support to no UE support   PT took pt back up to lobby in transport chair at end of session   PATIENT EDUCATION: Education details: exercise technique  Person educated: Patient Education method:  Explanation Education comprehension: verbalized understanding  HOME EXERCISE PROGRAM:  Access Code: [email or web address] URL: [email or web address] Date: 03/29/2023 Prepared by: Casimiro Needle  Exercises - Sit to Stand with Arms Crossed  - 1 x daily - 7 x weekly - 2 sets - 10 reps - Standing March with Counter Support  - 1 x daily - 7 x weekly - 2 sets - 10 reps - Standing Romberg to 3/4 Tandem Stance  - 1 x daily - 7 x weekly - 2 sets - 30 seconds hold   GOALS: Goals reviewed with patient? Yes   SHORT TERM GOALS: Target date: 04/09/2023   Patient will be independent in home exercise program to improve strength/mobility for better functional independence with ADLs. Baseline: to be given on visit 2  03/29/2023: provided initial HEP Goal status: INITIAL   LONG TERM GOALS: Target date: 06/04/2023    Patient will increase ABC score  by equal to or greater 3points   to demonstrate statistically significant improvement in mobility and quality of life.  Baseline: 93.375% Goal status: INITIAL  2.   Patient (> 41 years old) will complete five times sit to stand test in < 12 seconds indicating an increased LE strength and improved balance. Baseline: 13.78 Goal status: INITIAL  3.  Patient will increase Berg Balance score by > 6 points to demonstrate decreased fall risk during functional activities Baseline: 03/29/2023: 39/56 Goal status: INITIAL  4.  Patient will increase 10 meter walk test to >1.62m/s as to improve gait speed for better community ambulation and to reduce fall risk. Baseline: 0.22m/s Goal status: INITIAL  5.  Patient will increase 6 min walk test  by 162ft to demonstrate improved balance and tolerance to standing, allowing improved community access and independence.  Baseline: 03/31/2023: 475 feet *had to stop at due to onset of pain in bilateral hips* Goal status: INITIAL  6.  Patient will increase FGA score to >/ 24 as to demonstrate reduced fall risk and improved dynamic gait balance for better safety with community/home ambulation. Baseline: 03/29/2023: 12/30 Goal status: INITIAL   ASSESSMENT:  CLINICAL IMPRESSION:   Pt responded well to the exercises and put forth good effort throughout the session.  Pt fatigued with increasing the level of the NuStep, however with therapeutic rest breaks, was able to continue.  Pt does tend to enjoy the Korebalance exercises and would benefit from continued use at later appointments.  Pt will continue to benefit from skilled therapy to address remaining deficits in order to improve overall QoL and return to PLOF.     OBJECTIVE IMPAIRMENTS: Abnormal gait, cardiopulmonary status limiting activity, decreased activity tolerance, decreased balance, decreased coordination, decreased endurance, decreased knowledge of use of DME, decreased mobility, difficulty walking, decreased strength, decreased safety awareness, hypomobility, increased fascial restrictions, and impaired flexibility.   ACTIVITY LIMITATIONS: carrying, lifting,  bending, sitting, stairs, and locomotion level  PARTICIPATION LIMITATIONS: cleaning, laundry, driving, shopping, and community activity  PERSONAL FACTORS: Age and 1 comorbidity: hx of back fx and surgery   are also affecting patient's functional outcome.   REHAB POTENTIAL: Good  CLINICAL DECISION MAKING: Stable/uncomplicated  EVALUATION COMPLEXITY: Moderate  PLAN:  PT FREQUENCY: 1-2x/week  PT DURATION: 12 weeks  PLANNED INTERVENTIONS: 97110-Therapeutic exercises, 97530- Therapeutic activity, [id number]- Neuromuscular re-education, 97535- Self Care, 47829- Manual therapy, [phone]- Gait training, [phone]- Aquatic Therapy, Patient/Family education, Balance training, Stair training, Taping, Dry Needling, Joint mobilization, Joint manipulation, DME instructions, Cryotherapy, and Moist heat  PLAN FOR NEXT SESSION:  - follow-up on  aquatic therapy interest - follow up on when she is starting  - progress HEP when appropriate - dynamic balance including:   -  step-ups/alternating foot taps  - single leg stance  - eyes closed - dynamic gait training including:  - horizontal head turns - obstacle navigation - change in gait speed    Rozanna Corner, PT, DPT Physical Therapist - [facility]  04/26/23, 5:32 PM

## 2023-04-28 ENCOUNTER — Ambulatory Visit: Payer: Medicare Other

## 2023-05-03 ENCOUNTER — Ambulatory Visit (INDEPENDENT_AMBULATORY_CARE_PROVIDER_SITE_OTHER): Admitting: Dermatology

## 2023-05-03 ENCOUNTER — Encounter: Payer: Self-pay | Admitting: Dermatology

## 2023-05-03 ENCOUNTER — Ambulatory Visit: Payer: Medicare Other

## 2023-05-03 DIAGNOSIS — Z7189 Other specified counseling: Secondary | ICD-10-CM

## 2023-05-03 DIAGNOSIS — L209 Atopic dermatitis, unspecified: Secondary | ICD-10-CM

## 2023-05-03 DIAGNOSIS — R2681 Unsteadiness on feet: Secondary | ICD-10-CM | POA: Diagnosis not present

## 2023-05-03 DIAGNOSIS — R262 Difficulty in walking, not elsewhere classified: Secondary | ICD-10-CM

## 2023-05-03 DIAGNOSIS — Z79899 Other long term (current) drug therapy: Secondary | ICD-10-CM | POA: Diagnosis not present

## 2023-05-03 NOTE — Progress Notes (Signed)
   Follow-Up Visit   Subjective  Suzanne Snyder is a 73 y.o. female who presents for the following: Atopic Dermatitis at hands.  Patient currently on Dupixent , improved, no side effects. Not using any topicals.   The following portions of the chart were reviewed this encounter and updated as appropriate: medications, allergies, medical history  Review of Systems:  No other skin or systemic complaints except as noted in HPI or Assessment and Plan.  Objective  Well appearing patient in no apparent distress; mood and affect are within normal limits.  Areas Examined: hands  Relevant physical exam findings are noted in the Assessment and Plan.    Assessment & Plan   ATOPIC DERMATITIS, UNSPECIFIED TYPE   Related Medications dupilumab  (DUPIXENT ) prefilled syringe 300 mg  LONG-TERM USE OF HIGH-RISK MEDICATION   COUNSELING AND COORDINATION OF CARE     ATOPIC DERMATITIS, improved on Dupixent   Exam: xerotic scaly plaques of palmar and dorsal hands, erythematous scaly patches of ventral wrists 3% BSA  Chronic and persistent condition with duration or expected duration over one year. At goal  Atopic dermatitis (eczema) is a chronic, relapsing, pruritic condition that can significantly affect quality of life. It is often associated with allergic rhinitis and/or asthma and can require treatment with topical medications, phototherapy, or in severe cases biologic injectable medication (Dupixent ; Adbry) or Oral JAK inhibitors.  Treatment Plan: Patient is pleased with improvement and wants to continue. Continue Dupixent  300 mg/2 mL sq q 2 wks. No side effects.   Dupilumab  (Dupixent ) is a treatment given by injection for adults and children with moderate-to-severe atopic dermatitis. Goal is control of skin condition, not cure. It is given as 2 injections at the first dose followed by 1 injection ever 2 weeks thereafter.  Young children are dosed monthly.  Potential side effects  include allergic reaction, herpes infections, injection site reactions and conjunctivitis (inflammation of the eyes).  The use of Dupixent  requires long term medication management, including periodic office visits.   Recommend gentle skin care.   Return in about 1 year (around 05/02/2024) for Atopic Dermatitis, Dupixent .  Kerstin Peeling, RMA, am acting as scribe for Harris Liming, MD .   Documentation: I have reviewed the above documentation for accuracy and completeness, and I agree with the above.  Harris Liming, MD

## 2023-05-03 NOTE — Patient Instructions (Signed)
Dupilumab (Dupixent) is a treatment given by injection for adults and children with moderate-to-severe atopic dermatitis. Goal is control of skin condition, not cure. It is given as 2 injections at the first dose followed by 1 injection ever 2 weeks thereafter.  Young children are dosed monthly.  Potential side effects include allergic reaction, herpes infections, injection site reactions and conjunctivitis (inflammation of the eyes).  The use of Dupixent requires long term medication management, including periodic office visits.  Due to recent changes in healthcare laws, you may see results of your pathology and/or laboratory studies on MyChart before the doctors have had a chance to review them. We understand that in some cases there may be results that are confusing or concerning to you. Please understand that not all results are received at the same time and often the doctors may need to interpret multiple results in order to provide you with the best plan of care or course of treatment. Therefore, we ask that you please give [place] 2 business days to thoroughly review all your results before contacting the office for clarification. Should we see a critical lab result, you will be contacted sooner.   If You Need Anything After Your Visit  If you have any questions or concerns for your doctor, please call our main line at [phone] and press option 4 to reach your doctor's medical assistant. If no one answers, please leave a voicemail as directed and we will return your call as soon as possible. Messages left after 4 pm will be answered the following business day.   You may also send [place] a message via MyChart. We typically respond to MyChart messages within 1-2 business days.  For prescription refills, please ask your pharmacy to contact our office. Our fax number is [phone].  If you have an urgent issue when the clinic is closed that cannot wait until the next business day, you can page your  doctor at the number below.    Please note that while we do our best to be available for urgent issues outside of office hours, we are not available 24/7.   If you have an urgent issue and are unable to reach [place], you may choose to seek medical care at your doctor's office, retail clinic, urgent care center, or emergency room.  If you have a medical emergency, please immediately call 911 or go to the emergency department.  Pager Numbers  - Dr. Gwen Pounds: [phone]  - Dr. Roseanne Reno: [phone]  - Dr. Katrinka Blazing: [phone]   In the event of inclement weather, please call our main line at [phone] for an update on the status of any delays or closures.  Dermatology Medication Tips: Please keep the boxes that topical medications come in in order to help keep track of the instructions about where and how to use these. Pharmacies typically print the medication instructions only on the boxes and not directly on the medication tubes.   If your medication is too expensive, please contact our office at [phone] option 4 or send [place] a message through MyChart.   We are unable to tell what your co-pay for medications will be in advance as this is different depending on your insurance coverage. However, we may be able to find a substitute medication at lower cost or fill out paperwork to get insurance to cover a needed medication.   If a prior authorization is required to get your medication covered by your insurance company, please allow [place] 1-2 business days to complete  this process.  Drug prices often vary depending on where the prescription is filled and some pharmacies may offer cheaper prices.  The website www.goodrx.com contains coupons for medications through different pharmacies. The prices here do not account for what the cost may be with help from insurance (it may be cheaper with your insurance), but the website can give you the price if you did not use any insurance.  - You can print  the associated coupon and take it with your prescription to the pharmacy.  - You may also stop by our office during regular business hours and pick up a GoodRx coupon card.  - If you need your prescription sent electronically to a different pharmacy, notify our office through [facility] or by phone at [phone] option 4.     Si Usted Necesita Algo Despus de Su Visita  Tambin puede enviarnos un mensaje a travs de Clinical cytogeneticist. Por lo general respondemos a los mensajes de MyChart en el transcurso de 1 a 2 das hbiles.  Para renovar recetas, por favor pida a su farmacia que se ponga en contacto con nuestra oficina. Annie Sable de fax es [place] [phone].  Si tiene un asunto urgente cuando la clnica est cerrada y que no puede esperar hasta el siguiente da hbil, puede llamar/localizar a su doctor(a) al nmero que aparece a continuacin.   Por favor, tenga en cuenta que aunque hacemos todo lo posible para estar disponibles para asuntos urgentes fuera del horario de [place], no estamos disponibles las 24 horas del da, los 7 [address]  [address] de la [place].   Si tiene un problema urgente y no puede comunicarse con nosotros, puede optar por buscar atencin mdica  en el consultorio de su doctor(a), en una clnica privada, en un centro de atencin urgente o en una sala de emergencias.  Si tiene Engineer, drilling, por favor llame inmediatamente al 911 o vaya a la sala de emergencias.  Nmeros de bper  - Dr. Gwen Pounds: [phone]  - Dra. Roseanne Reno: [phone]  - Dr. Katrinka Blazing: [phone]   En caso de inclemencias del tiempo, por favor llame a Lacy Duverney principal al [phone] para una actualizacin sobre el [place] de cualquier retraso o cierre.  Consejos para la medicacin en dermatologa: Por favor, guarde las cajas en las que vienen los medicamentos de uso tpico para ayudarle a seguir las instrucciones sobre dnde y cmo usarlos. Las farmacias generalmente imprimen las  instrucciones del medicamento slo en las cajas y no directamente en los tubos del [place].   Si su medicamento es muy caro, por favor, pngase en contacto con Rolm Gala llamando al [phone] y presione la opcin 4 o envenos un mensaje a travs de Clinical cytogeneticist.   No podemos decirle cul ser su copago por los medicamentos por adelantado ya que esto es diferente dependiendo de la cobertura de su seguro. Sin embargo, es posible que podamos encontrar un medicamento sustituto a Audiological scientist un formulario para que el seguro cubra el medicamento que se considera necesario.   Si se requiere una autorizacin previa para que su compaa de seguros [place] su medicamento, por favor permtanos de 1 a 2 das hbiles para completar [address]  Los precios de los medicamentos varan con frecuencia dependiendo del Environmental consultant de dnde se surte la receta y alguna farmacias pueden ofrecer precios ms baratos.  El sitio web www.goodrx.com tiene cupones para medicamentos de Health and safety inspector. Los precios aqu no tienen en cuenta lo que podra costar con la ayuda del  seguro (puede ser ms barato con su seguro), pero el sitio web puede darle el precio si no Visual merchandiser.  - Puede imprimir el cupn correspondiente y llevarlo con su receta a la farmacia.  - Tambin puede pasar por nuestra oficina durante el horario de atencin regular y Education officer, museum una tarjeta de cupones de GoodRx.  - Si necesita que su receta se enve electrnicamente a una farmacia diferente, informe a nuestra oficina a travs de MyChart de [place] o por telfono llamando al [phone] y presione la opcin 4.

## 2023-05-03 NOTE — Therapy (Signed)
 OUTPATIENT PHYSICAL THERAPY NEURO TREATMENT   Patient Name: Suzanne Snyder MRN: [id number] DOB:[birth year], 73 y.o., female Today's Date: 05/03/2023   PCP: Little Riff, MD  REFERRING PROVIDER:   Little Riff, MD    END OF SESSION:    PT End of Session - 05/03/23 1107     Visit Number 8    Number of Visits 24    Date for PT Re-Evaluation 06/03/23    PT Start Time 1105    PT Stop Time 1138    PT Time Calculation (min) 33 min    Equipment Utilized During Treatment Gait belt    Activity Tolerance Patient tolerated treatment well;Patient limited by pain;Other (comment)   pt feels like she's still recovering from low blood sugar episode this morning   Behavior During Therapy [facility] for tasks assessed/performed              Past Medical History:  Diagnosis Date   Anxiety    Arthritis    Asthma    Diabetes mellitus without complication (HCC)    Insulin  pump in place    Osteoporosis    RLS (restless legs syndrome)    Smokers' cough (HCC)    Wears dentures    full upper and lower   Past Surgical History:  Procedure Laterality Date   CARPAL TUNNEL RELEASE Left 11/20/2022   Procedure: CARPAL TUNNEL RELEASE;  Surgeon: Venus Ginsberg, MD;  Location: [facility];  Service: Orthopedics;  Laterality: Left;   CATARACT EXTRACTION W/PHACO Right 02/20/2020   Procedure: CATARACT EXTRACTION PHACO AND INTRAOCULAR LENS PLACEMENT (IOC) RIGHT 5.60 00:36.3;  Surgeon: Clair Crews, MD;  Location: [facility];  Service: Ophthalmology;  Laterality: Right;  Diabetic - insulin  pump Requests not first   CATARACT EXTRACTION W/PHACO Left 03/05/2020   Procedure: CATARACT EXTRACTION PHACO AND INTRAOCULAR LENS PLACEMENT (IOC) LEFT 4.94 00:31.6;  Surgeon: Clair Crews, MD;  Location: [facility];  Service: Ophthalmology;  Laterality: Left;   HIP PINNING,CANNULATED Right 02/09/2022   Procedure: PERCUTANEOUS FIXATION OF FEMORAL NECK;  Surgeon: Marlynn Singer, MD;   Location: [facility];  Service: Orthopedics;  Laterality: Right;   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   ROTATOR CUFF REPAIR Left 2014 and 2015   SPINE SURGERY N/A 1984   L4-5, not sure of procedure   THYROID SURGERY     Patient Active Problem List   Diagnosis Date Noted   Pain management contract signed 04/20/2023   Generalized anxiety disorder 11/29/2022   RLS (restless legs syndrome) 11/29/2022   Acute metabolic encephalopathy 11/19/2022   Pain and swelling of left wrist 11/19/2022   Fall 11/19/2022   Suppurative tenosynovitis of flexor tendon of left hand 11/19/2022   Rib pain on left side 08/25/2022   SBO (small bowel obstruction) (HCC) 05/06/2022   Small bowel obstruction (HCC) 05/06/2022   Portal vein obstruction 05/06/2022   Right hip pain 02/14/2022   Hip pain, acute, right 02/13/2022   Electrolyte abnormality 02/13/2022   Anemia 02/13/2022   Malnutrition of moderate degree 02/10/2022   Closed right hip fracture (HCC) 02/08/2022   Hypomagnesemia 02/08/2022   Cellulitis of right ankle 02/08/2022   Hyponatremia 02/08/2022   Tobacco abuse 02/08/2022   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) 02/08/2022   Lumbar facet arthropathy 11/27/2021   Lumbar degenerative disc disease 11/27/2021   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/27/2021   Chronic left shoulder pain 11/27/2021   Cervical facet joint syndrome 11/27/2021  Cervical radicular pain 11/27/2021   Chronic pain syndrome 11/27/2021   Cellulitis of right wrist 12/21/2020   Cellulitis of right thigh 12/11/2020   Hypoglycemia 12/10/2020    ONSET DATE: >1year   REFERRING DIAG:  Diagnosis  R26.81 (ICD-10-CM) - Gait instability    THERAPY DIAG:  Unsteadiness on feet  Difficulty in walking, not elsewhere classified  Rationale for Evaluation and Treatment: Rehabilitation  SUBJECTIVE:                                                                                                                                                                                              SUBJECTIVE STATEMENT:   Pt reports she has not noticed much difference with her balance. She says she doesn't feel off balance a lot. Pt having her usual aches/pains (shoulders, hips, back). Pt not feeling great today; had a low blood sugar episode earlier this morning but says it is back up. Pt reports current blood sugar 205    Pt accompanied by: self and daughter, [name]  PERTINENT HISTORY:  PMH anxiety, arthritis, DM, osteoporosis RLS, hx L total hip 2007, RTC repair L 2014 and 2015, spine surgery 1984. Pt arrived late to scheduled evaluation and is extremely verbose and tangential throughout evaluation.  Recently in ED 12/10/22 with low blood sugar, reported at time she had not been eating much. Pt reports that in 2023 sustained femur fracture, After not taking care of her self while caring for husband that was struggling with Dementia. She States that she has been slow to get going and gets tired extremely quickly. reports that her lower back and hips hurt constantly when on her feet.  Reports significant pain in BLE from chronic fx and truama.   PAIN:  Are you having pain? Yes: NPRS scale: 7.5/10  Pain location: lower back and hips  Pain description: "vivid" constant   Aggravating factors: walking  Relieving factors: mild improvement with tramadol , has become less affective over the last year ot 2.   PRECAUTIONS: Fall  RED FLAGS: Compression fracture: Yes: spinal surgrey in 1983    WEIGHT BEARING RESTRICTIONS: No  FALLS: Has patient fallen in last 6 months? No and a few near falls   LIVING ENVIRONMENT: Lives with: lives alone Lives in: House/apartment Stairs: Yes: Internal: 14 steps; on right going up and External: 6 steps; on right going up Has following equipment at home: Wheelchair (manual) and walking sticks.   PLOF: Independent with basic ADLs and Independent with household  mobility without device  PATIENT GOALS: be on feet for longer time. Less pain.   OBJECTIVE:  Note: Objective measures  were completed at Evaluation unless otherwise noted.  DIAGNOSTIC FINDINGS:   CT cervical spine   IMPRESSION: Moderate to advanced degenerative disc and facet disease.   No acute bony abnormality.    COGNITION: Overall cognitive status: Within functional limits for tasks assessed   SENSATION: WFL  COORDINATION: WFL with testing. Mild staggering of gait.   EDEMA:  Mild edema in Bil hands and L   MUSCLE TONE: WFL    POSTURE: rounded shoulders, forward head, and decreased lumbar lordosis  LOWER EXTREMITY ROM:     Grossly WFL.  Limited trunk and lumbar extension to 5 deg.  Trunk flexion WFL.  Mild instability with trunk rotation, but grossly WFL ROM.    LOWER EXTREMITY MMT:    MMT Right Eval Left Eval  Hip flexion 4+ 4  Hip extension 4 4  Hip abduction 4+ 4+  Hip adduction 5 5  Hip internal rotation    Hip external rotation    Knee flexion 4+ 4+  Knee extension 5 5  Ankle dorsiflexion 4+ 4  Ankle plantarflexion    Ankle inversion    Ankle eversion    (Blank rows = not tested)  BED MOBILITY:  Sit to supine Complete Independence Supine to sit Complete Independence  TRANSFERS: Assistive device utilized: None  Sit to stand: SBA Stand to sit: SBA Chair to chair: SBA Floor:  not assessed     CURB:  Level of Assistance: CGA Assistive device utilized:  rail Curb Comments:   STAIRS: Level of Assistance: CGA Stair Negotiation Technique: Step to Pattern with Bilateral Rails Number of Stairs: 4  Height of Stairs: 6  Comments:   GAIT: Gait pattern:  occasional staggering, step through pattern, and narrow BOS Distance walked: 60 Assistive device utilized: None Level of assistance: SBA Comments: staggering in turn and on first bout of  FUNCTIONAL TESTS:  5 times sit to stand: 13.78 Timed up and go (TUG): 10.28sec  6  minute walk test: To be completed  10 meter walk test: 11.86, 13.21sec (0.46m/s) Berg Balance Scale: to be completed  Functional gait assessment: to be completed   PATIENT SURVEYS:  ABC scale 93.375                                                                                                                              TREATMENT DATE: 05/03/2023  Unless otherwise stated, CGA/SBA was provided and gait belt donned in order to ensure pt safety throughout session.  Pt takes blood sugar prior to interventions - 205  NMR: Obstacle course ambulating around cones and cone taps - knocks over cones multiple times, but does improve with reps --progressed to stepping over low obstacles (2 obstacles) and continuing with cone taps  Comments: intervention limited by hip pain, stopped once pt reported pain  Kore balance trainer working on patients proprioception and standing balance on dynamic surface  Setting type(s): tux racer bunny slope ,then increasingly difficult tracks -  stability lvl 4  Reps: 3 Comments: from BUE support to UUE support  Semi-tandem gait in long hallway 2x80 ft- limited due to hip pain.  Pt requests to end session early.    PATIENT EDUCATION: Education details: exercise technique  Person educated: Patient Education method: Explanation Education comprehension: verbalized understanding  HOME EXERCISE PROGRAM:  Access Code: [email or web address] URL: [email or web address] Date: 03/29/2023 Prepared by: Carlen Chasten  Exercises - Sit to Stand with Arms Crossed  - 1 x daily - 7 x weekly - 2 sets - 10 reps - Standing March with Counter Support  - 1 x daily - 7 x weekly - 2 sets - 10 reps - Standing Romberg to 3/4 Tandem Stance  - 1 x daily - 7 x weekly - 2 sets - 30 seconds hold   GOALS: Goals reviewed with patient? Yes   SHORT TERM GOALS: Target date: 04/09/2023   Patient will be independent in home exercise program to improve strength/mobility for better  functional independence with ADLs. Baseline: to be given on visit 2  03/29/2023: provided initial HEP Goal status: INITIAL   LONG TERM GOALS: Target date: 06/04/2023    Patient will increase ABC score  by equal to or greater 3points   to demonstrate statistically significant improvement in mobility and quality of life.  Baseline: 93.375% Goal status: INITIAL  2.  Patient (> 109 years old) will complete five times sit to stand test in < 12 seconds indicating an increased LE strength and improved balance. Baseline: 13.78 Goal status: INITIAL  3.  Patient will increase Berg Balance score by > 6 points to demonstrate decreased fall risk during functional activities Baseline: 03/29/2023: 39/56 Goal status: INITIAL  4.  Patient will increase 10 meter walk test to >1.4m/s as to improve gait speed for better community ambulation and to reduce fall risk. Baseline: 0.46m/s Goal status: INITIAL  5.  Patient will increase 6 min walk test  by 182ft to demonstrate improved balance and tolerance to standing, allowing improved community access and independence.  Baseline: 03/31/2023: 475 feet *had to stop at due to onset of pain in bilateral hips* Goal status: INITIAL  6.  Patient will increase FGA score to >/ 24 as to demonstrate reduced fall risk and improved dynamic gait balance for better safety with community/home ambulation. Baseline: 03/29/2023: 12/30 Goal status: INITIAL   ASSESSMENT:  CLINICAL IMPRESSION:  Pt session somewhat limited today by pt feeling unwell. Appointment ended early as a result per pt request. Pt initially had difficulty with SLB progression (cone taps in obstacle course, multiple errors), but did improve some with reps. Pt noted that she believes she'd do better if she were feeling better. Pt will continue to benefit from skilled therapy to address remaining deficits in order to improve overall QoL and return to PLOF.     OBJECTIVE IMPAIRMENTS: Abnormal gait,  cardiopulmonary status limiting activity, decreased activity tolerance, decreased balance, decreased coordination, decreased endurance, decreased knowledge of use of DME, decreased mobility, difficulty walking, decreased strength, decreased safety awareness, hypomobility, increased fascial restrictions, and impaired flexibility.   ACTIVITY LIMITATIONS: carrying, lifting, bending, sitting, stairs, and locomotion level  PARTICIPATION LIMITATIONS: cleaning, laundry, driving, shopping, and community activity  PERSONAL FACTORS: Age and 1 comorbidity: hx of back fx and surgery   are also affecting patient's functional outcome.   REHAB POTENTIAL: Good  CLINICAL DECISION MAKING: Stable/uncomplicated  EVALUATION COMPLEXITY: Moderate  PLAN:  PT FREQUENCY: 1-2x/week  PT DURATION: 12 weeks  PLANNED INTERVENTIONS: 97110-Therapeutic exercises,  16109- Therapeutic activity, [id number]- Neuromuscular re-education, [phone]- Self Care, [id number]- Manual therapy, [phone]- Gait training, [phone]- Aquatic Therapy, Patient/Family education, Balance training, Stair training, Taping, Dry Needling, Joint mobilization, Joint manipulation, DME instructions, Cryotherapy, and Moist heat  PLAN FOR NEXT SESSION:  - follow-up on aquatic therapy interest - follow up on when she is starting  - progress HEP when appropriate - dynamic balance including:   -  step-ups/alternating foot taps  - single leg stance  - eyes closed - dynamic gait training including:  - horizontal head turns - obstacle navigation - change in gait speed    Aminta Kales PT, DPT  Physical Therapist - [facility]  05/03/23, 11:49 AM

## 2023-05-05 ENCOUNTER — Ambulatory Visit: Payer: Medicare Other

## 2023-05-05 DIAGNOSIS — R2681 Unsteadiness on feet: Secondary | ICD-10-CM | POA: Diagnosis not present

## 2023-05-05 DIAGNOSIS — M6281 Muscle weakness (generalized): Secondary | ICD-10-CM

## 2023-05-05 DIAGNOSIS — R262 Difficulty in walking, not elsewhere classified: Secondary | ICD-10-CM

## 2023-05-05 DIAGNOSIS — M25551 Pain in right hip: Secondary | ICD-10-CM

## 2023-05-05 NOTE — Therapy (Signed)
 OUTPATIENT PHYSICAL THERAPY NEURO TREATMENT   Patient Name: Suzanne Snyder MRN: [id number] DOB:Oct 20, 1950, 73 y.o., female Today's Date: 05/05/2023   PCP: Suzanne Riff, MD  REFERRING PROVIDER:   Little Riff, MD    END OF SESSION:    PT End of Session - 05/05/23 1322     Visit Number 9    Number of Visits 24    Date for PT Re-Evaluation 06/03/23    PT Start Time 1323    PT Stop Time 1358    PT Time Calculation (min) 35 min    Equipment Utilized During Treatment Gait belt    Activity Tolerance Patient tolerated treatment well;Patient limited by pain    Behavior During Therapy WFL for tasks assessed/performed              Past Medical History:  Diagnosis Date   Anxiety    Arthritis    Asthma    Diabetes mellitus without complication (HCC)    Insulin  pump in place    Osteoporosis    RLS (restless legs syndrome)    Smokers' cough (HCC)    Wears dentures    full upper and lower   Past Surgical History:  Procedure Laterality Date   CARPAL TUNNEL RELEASE Left 11/20/2022   Procedure: CARPAL TUNNEL RELEASE;  Surgeon: Suzanne Ginsberg, MD;  Location: [facility];  Service: Orthopedics;  Laterality: Left;   CATARACT EXTRACTION W/PHACO Right 02/20/2020   Procedure: CATARACT EXTRACTION PHACO AND INTRAOCULAR LENS PLACEMENT (IOC) RIGHT 5.60 00:36.3;  Surgeon: Suzanne Crews, MD;  Location: [facility];  Service: Ophthalmology;  Laterality: Right;  Diabetic - insulin  pump Requests not first   CATARACT EXTRACTION W/PHACO Left 03/05/2020   Procedure: CATARACT EXTRACTION PHACO AND INTRAOCULAR LENS PLACEMENT (IOC) LEFT 4.94 00:31.6;  Surgeon: Suzanne Crews, MD;  Location: [facility];  Service: Ophthalmology;  Laterality: Left;   HIP PINNING,CANNULATED Right 02/09/2022   Procedure: PERCUTANEOUS FIXATION OF FEMORAL NECK;  Surgeon: Suzanne Singer, MD;  Location: [facility];  Service: Orthopedics;  Laterality: Right;   JOINT REPLACEMENT Left 2007   total  hip   ROTATOR CUFF REPAIR Left 2014 and 2015   SPINE SURGERY N/A 1984   L4-5, not sure of procedure   THYROID SURGERY     Patient Active Problem List   Diagnosis Date Noted   Pain management contract signed 04/20/2023   Generalized anxiety disorder 11/29/2022   RLS (restless legs syndrome) 11/29/2022   Acute metabolic encephalopathy 11/19/2022   Pain and swelling of left wrist 11/19/2022   Fall 11/19/2022   Suppurative tenosynovitis of flexor tendon of left hand 11/19/2022   Rib pain on left side 08/25/2022   SBO (small bowel obstruction) (HCC) 05/06/2022   Small bowel obstruction (HCC) 05/06/2022   Portal vein obstruction 05/06/2022   Right hip pain 02/14/2022   Hip pain, acute, right 02/13/2022   Electrolyte abnormality 02/13/2022   Anemia 02/13/2022   Malnutrition of moderate degree 02/10/2022   Closed right hip fracture (HCC) 02/08/2022   Hypomagnesemia 02/08/2022   Cellulitis of right ankle 02/08/2022   Hyponatremia 02/08/2022   Tobacco abuse 02/08/2022   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) 02/08/2022   Lumbar facet arthropathy 11/27/2021   Lumbar degenerative disc disease 11/27/2021   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/27/2021   Chronic left shoulder pain 11/27/2021   Cervical facet joint syndrome 11/27/2021   Cervical radicular pain 11/27/2021   Chronic pain syndrome 11/27/2021   Cellulitis of  right wrist 12/21/2020   Cellulitis of right thigh 12/11/2020   Hypoglycemia 12/10/2020    ONSET DATE: >1year   REFERRING DIAG:  Diagnosis  R26.81 (ICD-10-CM) - Gait instability    THERAPY DIAG:  Muscle weakness (generalized)  Unsteadiness on feet  Difficulty in walking, not elsewhere classified  Pain of both hip joints  Rationale for Evaluation and Treatment: Rehabilitation  SUBJECTIVE:                                                                                                                                                                                              SUBJECTIVE STATEMENT:   Pt feeling a bit better today. Her hips are still hurting (chronic pain). Pt reports no stumbles/falls. Pt reports no other changes.   Pt accompanied by: self and daughter, Suzanne Snyder  PERTINENT HISTORY:  PMH anxiety, arthritis, DM, osteoporosis RLS, hx L total hip 2007, RTC repair L 2014 and 2015, spine surgery 1984. Pt arrived late to scheduled evaluation and is extremely verbose and tangential throughout evaluation.  Recently in ED 12/10/22 with low blood sugar, reported at time she had not been eating much. Pt reports that in 2023 sustained femur fracture, After not taking care of her self while caring for husband that was struggling with Dementia. She States that she has been slow to get going and gets tired extremely quickly. reports that her lower back and hips hurt constantly when on her feet.  Reports significant pain in BLE from chronic fx and truama.   PAIN:  Are you having pain? Yes: NPRS scale: 7.5/10  Pain location: lower back and hips  Pain description: "vivid" constant   Aggravating factors: walking  Relieving factors: mild improvement with tramadol , has become less affective over the last year ot 2.   PRECAUTIONS: Fall  RED FLAGS: Compression fracture: Yes: spinal surgrey in 1983    WEIGHT BEARING RESTRICTIONS: No  FALLS: Has patient fallen in last 6 months? No and a few near falls   LIVING ENVIRONMENT: Lives with: lives alone Lives in: House/apartment Stairs: Yes: Internal: 14 steps; on right going up and External: 6 steps; on right going up Has following equipment at home: Wheelchair (manual) and walking sticks.   PLOF: Independent with basic ADLs and Independent with household mobility without device  PATIENT GOALS: be on feet for longer time. Less pain.   OBJECTIVE:  Note: Objective measures were completed at Evaluation unless otherwise noted.  DIAGNOSTIC FINDINGS:   CT cervical  spine   IMPRESSION: Moderate to advanced degenerative disc and facet disease.   No acute bony abnormality.    COGNITION: Overall  cognitive status: Within functional limits for tasks assessed   SENSATION: WFL  COORDINATION: WFL with testing. Mild staggering of gait.   EDEMA:  Mild edema in Bil hands and L   MUSCLE TONE: WFL    POSTURE: rounded shoulders, forward head, and decreased lumbar lordosis  LOWER EXTREMITY ROM:     Grossly WFL.  Limited trunk and lumbar extension to 5 deg.  Trunk flexion WFL.  Mild instability with trunk rotation, but grossly WFL ROM.    LOWER EXTREMITY MMT:    MMT Right Eval Left Eval  Hip flexion 4+ 4  Hip extension 4 4  Hip abduction 4+ 4+  Hip adduction 5 5  Hip internal rotation    Hip external rotation    Knee flexion 4+ 4+  Knee extension 5 5  Ankle dorsiflexion 4+ 4  Ankle plantarflexion    Ankle inversion    Ankle eversion    (Blank rows = not tested)  BED MOBILITY:  Sit to supine Complete Independence Supine to sit Complete Independence  TRANSFERS: Assistive device utilized: None  Sit to stand: SBA Stand to sit: SBA Chair to chair: SBA Floor:  not assessed     CURB:  Level of Assistance: CGA Assistive device utilized:  rail Curb Comments:   STAIRS: Level of Assistance: CGA Stair Negotiation Technique: Step to Pattern with Bilateral Rails Number of Stairs: 4  Height of Stairs: 6  Comments:   GAIT: Gait pattern:  occasional staggering, step through pattern, and narrow BOS Distance walked: 60 Assistive device utilized: None Level of assistance: SBA Comments: staggering in turn and on first bout of  FUNCTIONAL TESTS:  5 times sit to stand: 13.78 Timed up and go (TUG): 10.28sec  6 minute walk test: To be completed  10 meter walk test: 11.86, 13.21sec (0.76m/s) Berg Balance Scale: to be completed  Functional gait assessment: to be completed   PATIENT SURVEYS:  ABC scale 93.375                                                                                                                               TREATMENT DATE: 05/05/2023  Unless otherwise stated, CGA was provided and gait belt donned in order to ensure pt safety throughout session.  NMR: 3/4 tandem 2x30 sec each LE Tandem 2x30 sec each LE Gait with vertical head turns 4x10 meters Gait with horizontal head 3x10 meters - onset of hip pain limited completing 4th set  Comments: pt unsteady with majority of NMR  TA: STS 2x10   TE: LAQ 2x10 each LE - pt rates easy Seated march 9x LLE only - stopped due to hip pain increase  Seated adductor squeeze with pball 12x  bilat  BTB hamstring curl 15x each LE     PATIENT EDUCATION: Education details: exercise technique  Person educated: Patient Education method: Explanation Education comprehension: verbalized understanding  HOME EXERCISE PROGRAM:  Access Code: [email or web address] URL: [email or web address] Date: 03/29/2023  Prepared by: Carlen Chasten  Exercises - Sit to Stand with Arms Crossed  - 1 x daily - 7 x weekly - 2 sets - 10 reps - Standing March with Counter Support  - 1 x daily - 7 x weekly - 2 sets - 10 reps - Standing Romberg to 3/4 Tandem Stance  - 1 x daily - 7 x weekly - 2 sets - 30 seconds hold   GOALS: Goals reviewed with patient? Yes   SHORT TERM GOALS: Target date: 04/09/2023   Patient will be independent in home exercise program to improve strength/mobility for better functional independence with ADLs. Baseline: to be given on visit 2  03/29/2023: provided initial HEP Goal status: INITIAL   LONG TERM GOALS: Target date: 06/04/2023    Patient will increase ABC score  by equal to or greater 3points   to demonstrate statistically significant improvement in mobility and quality of life.  Baseline: 93.375% Goal status: INITIAL  2.  Patient (> 37 years old) will complete five times sit to stand test in < 12 seconds indicating an  increased LE strength and improved balance. Baseline: 13.78 Goal status: INITIAL  3.  Patient will increase Berg Balance score by > 6 points to demonstrate decreased fall risk during functional activities Baseline: 03/29/2023: 39/56 Goal status: INITIAL  4.  Patient will increase 10 meter walk test to >1.10m/s as to improve gait speed for better community ambulation and to reduce fall risk. Baseline: 0.74m/s Goal status: INITIAL  5.  Patient will increase 6 min walk test  by 150ft to demonstrate improved balance and tolerance to standing, allowing improved community access and independence.  Baseline: 03/31/2023: 475 feet *had to stop at due to onset of pain in bilateral hips* Goal status: INITIAL  6.  Patient will increase FGA score to >/ 24 as to demonstrate reduced fall risk and improved dynamic gait balance for better safety with community/home ambulation. Baseline: 03/29/2023: 12/30 Goal status: INITIAL   ASSESSMENT:  CLINICAL IMPRESSION:  Pt returns to PT feeling better today. She was able to complete a variety of strengthening and static and dynamic balance activities. However, activities still are somewhat limited by hip pain. Pt will continue to benefit from skilled therapy to address remaining deficits in order to improve overall QoL and return to PLOF.     OBJECTIVE IMPAIRMENTS: Abnormal gait, cardiopulmonary status limiting activity, decreased activity tolerance, decreased balance, decreased coordination, decreased endurance, decreased knowledge of use of DME, decreased mobility, difficulty walking, decreased strength, decreased safety awareness, hypomobility, increased fascial restrictions, and impaired flexibility.   ACTIVITY LIMITATIONS: carrying, lifting, bending, sitting, stairs, and locomotion level  PARTICIPATION LIMITATIONS: cleaning, laundry, driving, shopping, and community activity  PERSONAL FACTORS: Age and 1 comorbidity: hx of back fx and surgery   are also  affecting patient's functional outcome.   REHAB POTENTIAL: Good  CLINICAL DECISION MAKING: Stable/uncomplicated  EVALUATION COMPLEXITY: Moderate  PLAN:  PT FREQUENCY: 1-2x/week  PT DURATION: 12 weeks  PLANNED INTERVENTIONS: 97110-Therapeutic exercises, 97530- Therapeutic activity, [id number]- Neuromuscular re-education, 97535- Self Care, [id number]- Manual therapy, [phone]- Gait training, [phone]- Aquatic Therapy, Patient/Family education, Balance training, Stair training, Taping, Dry Needling, Joint mobilization, Joint manipulation, DME instructions, Cryotherapy, and Moist heat  PLAN FOR NEXT SESSION:  - follow-up on aquatic therapy interest - follow up on when she is starting  - progress HEP when appropriate - dynamic balance including:   -  step-ups/alternating foot taps  - single leg stance  - eyes closed - dynamic  gait training including:  - horizontal head turns - obstacle navigation - change in gait speed    Aminta Kales PT, DPT  Physical Therapist - [facility]  05/05/23, 5:12 PM

## 2023-05-10 ENCOUNTER — Ambulatory Visit: Payer: Medicare Other | Admitting: Physical Therapy

## 2023-05-12 ENCOUNTER — Ambulatory Visit: Payer: Medicare Other

## 2023-05-17 ENCOUNTER — Ambulatory Visit: Payer: Medicare Other | Attending: Internal Medicine | Admitting: Physical Therapy

## 2023-05-17 DIAGNOSIS — R269 Unspecified abnormalities of gait and mobility: Secondary | ICD-10-CM | POA: Diagnosis present

## 2023-05-17 DIAGNOSIS — M25552 Pain in left hip: Secondary | ICD-10-CM | POA: Insufficient documentation

## 2023-05-17 DIAGNOSIS — M6281 Muscle weakness (generalized): Secondary | ICD-10-CM | POA: Insufficient documentation

## 2023-05-17 DIAGNOSIS — R262 Difficulty in walking, not elsewhere classified: Secondary | ICD-10-CM | POA: Diagnosis present

## 2023-05-17 DIAGNOSIS — M79672 Pain in left foot: Secondary | ICD-10-CM | POA: Insufficient documentation

## 2023-05-17 DIAGNOSIS — R2681 Unsteadiness on feet: Secondary | ICD-10-CM | POA: Insufficient documentation

## 2023-05-17 DIAGNOSIS — M5459 Other low back pain: Secondary | ICD-10-CM | POA: Insufficient documentation

## 2023-05-17 DIAGNOSIS — M25551 Pain in right hip: Secondary | ICD-10-CM | POA: Diagnosis present

## 2023-05-17 NOTE — Therapy (Signed)
 OUTPATIENT PHYSICAL THERAPY NEURO TREATMENT Physical Therapy Progress Note   Dates of reporting period  03/11/2023   to   05/17/2023    Patient Name: Suzanne Snyder MRN: [id number] DOB:[birth year], 73 y.o., female Today's Date: 05/17/2023   PCP: Little Riff, MD  REFERRING PROVIDER:   Little Riff, MD    END OF SESSION:    PT End of Session - 05/17/23 1528     Visit Number 10    Number of Visits 24    Date for PT Re-Evaluation 06/03/23    PT Start Time 1530    PT Stop Time 1618    PT Time Calculation (min) 48 min    Equipment Utilized During Treatment Gait belt    Activity Tolerance Patient tolerated treatment well    Behavior During Therapy [facility] for tasks assessed/performed               Past Medical History:  Diagnosis Date   Anxiety    Arthritis    Asthma    Diabetes mellitus without complication (HCC)    Insulin  pump in place    Osteoporosis    RLS (restless legs syndrome)    Smokers' cough (HCC)    Wears dentures    full upper and lower   Past Surgical History:  Procedure Laterality Date   CARPAL TUNNEL RELEASE Left 11/20/2022   Procedure: CARPAL TUNNEL RELEASE;  Surgeon: Venus Ginsberg, MD;  Location: [facility];  Service: Orthopedics;  Laterality: Left;   CATARACT EXTRACTION W/PHACO Right 02/20/2020   Procedure: CATARACT EXTRACTION PHACO AND INTRAOCULAR LENS PLACEMENT (IOC) RIGHT 5.60 00:36.3;  Surgeon: Clair Crews, MD;  Location: [facility];  Service: Ophthalmology;  Laterality: Right;  Diabetic - insulin  pump Requests not first   CATARACT EXTRACTION W/PHACO Left 03/05/2020   Procedure: CATARACT EXTRACTION PHACO AND INTRAOCULAR LENS PLACEMENT (IOC) LEFT 4.94 00:31.6;  Surgeon: Clair Crews, MD;  Location: [facility];  Service: Ophthalmology;  Laterality: Left;   HIP PINNING,CANNULATED Right 02/09/2022   Procedure: PERCUTANEOUS FIXATION OF FEMORAL NECK;  Surgeon: Marlynn Singer, MD;  Location: [facility];  Service:  Orthopedics;  Laterality: Right;   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   ROTATOR CUFF REPAIR Left 2014 and 2015   SPINE SURGERY N/A 1984   L4-5, not sure of procedure   THYROID  SURGERY     Patient Active Problem List   Diagnosis Date Noted   Pain management contract signed 04/20/2023   Generalized anxiety disorder 11/29/2022   RLS (restless legs syndrome) 11/29/2022   Acute metabolic encephalopathy 11/19/2022   Pain and swelling of left wrist 11/19/2022   Fall 11/19/2022   Suppurative tenosynovitis of flexor tendon of left hand 11/19/2022   Rib pain on left side 08/25/2022   SBO (small bowel obstruction) (HCC) 05/06/2022   Small bowel obstruction (HCC) 05/06/2022   Portal vein obstruction 05/06/2022   Right hip pain 02/14/2022   Hip pain, acute, right 02/13/2022   Electrolyte abnormality 02/13/2022   Anemia 02/13/2022   Malnutrition of moderate degree 02/10/2022   Closed right hip fracture (HCC) 02/08/2022   Hypomagnesemia 02/08/2022   Cellulitis of right ankle 02/08/2022   Hyponatremia 02/08/2022   Tobacco abuse 02/08/2022   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) 02/08/2022   Lumbar facet arthropathy 11/27/2021   Lumbar degenerative disc disease 11/27/2021   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/27/2021   Chronic left shoulder pain 11/27/2021   Cervical facet joint syndrome  11/27/2021   Cervical radicular pain 11/27/2021   Chronic pain syndrome 11/27/2021   Cellulitis of right wrist 12/21/2020   Cellulitis of right thigh 12/11/2020   Hypoglycemia 12/10/2020    ONSET DATE: >1year   REFERRING DIAG:  Diagnosis  R26.81 (ICD-10-CM) - Gait instability    THERAPY DIAG:  Muscle weakness (generalized)  Unsteadiness on feet  Difficulty in walking, not elsewhere classified  Pain of both hip joints  Abnormality of gait and mobility  Other low back pain  Pain in left foot  Rationale for Evaluation and Treatment:  Rehabilitation  SUBJECTIVE:                                                                                                                                                                                             SUBJECTIVE STATEMENT:   Pt reports she was really sick lat week, likely with the stomach flu. Pt reports her BS device was not communicating correctly 2 days ago and her BS spiked close to 400. States this morning when she woke up her BS was 38. Therapist encouraged pt to follow-up with physician regarding this and reinforced education on [facility]. Pt reports BS was 127 approximately 1 hour ago.  Denies falls/stumbles since last visit. No specific complaints of pain during session except for discomfort from her shoes.  Pt accompanied by: self     PERTINENT HISTORY:  PMH anxiety, arthritis, DM, osteoporosis RLS, hx L total hip 2007, RTC repair L 2014 and 2015, spine surgery 1984. Pt arrived late to scheduled evaluation and is extremely verbose and tangential throughout evaluation.  Recently in ED 12/10/22 with low blood sugar, reported at time she had not been eating much. Pt reports that in 2023 sustained femur fracture, After not taking care of her self while caring for husband that was struggling with Dementia. She States that she has been slow to get going and gets tired extremely quickly. reports that her lower back and hips hurt constantly when on her feet.  Reports significant pain in BLE from chronic fx and truama.   PAIN:  Are you having pain? Yes: NPRS scale: 7.5/10  Pain location: lower back and hips  Pain description: "vivid" constant   Aggravating factors: walking  Relieving factors: mild improvement with tramadol , has become less affective over the last year ot 2.   PRECAUTIONS: Fall  RED FLAGS: Compression fracture: Yes: spinal surgrey in 1983    WEIGHT BEARING RESTRICTIONS: No  FALLS: Has patient fallen in last 6 months? No and a few near  falls   LIVING ENVIRONMENT: Lives with: lives alone Lives in:  House/apartment Stairs: Yes: Internal: 14 steps; on right going up and External: 6 steps; on right going up Has following equipment at home: Wheelchair (manual) and walking sticks.   PLOF: Independent with basic ADLs and Independent with household mobility without device  PATIENT GOALS: be on feet for longer time. Less pain.   OBJECTIVE:  Note: Objective measures were completed at Evaluation unless otherwise noted.  DIAGNOSTIC FINDINGS:   CT cervical spine   IMPRESSION: Moderate to advanced degenerative disc and facet disease.   No acute bony abnormality.    COGNITION: Overall cognitive status: Within functional limits for tasks assessed   SENSATION: WFL  COORDINATION: WFL with testing. Mild staggering of gait.   EDEMA:  Mild edema in Bil hands and L   MUSCLE TONE: WFL    POSTURE: rounded shoulders, forward head, and decreased lumbar lordosis  LOWER EXTREMITY ROM:     Grossly WFL.  Limited trunk and lumbar extension to 5 deg.  Trunk flexion WFL.  Mild instability with trunk rotation, but grossly WFL ROM.    LOWER EXTREMITY MMT:    MMT Right Eval Left Eval  Hip flexion 4+ 4  Hip extension 4 4  Hip abduction 4+ 4+  Hip adduction 5 5  Hip internal rotation    Hip external rotation    Knee flexion 4+ 4+  Knee extension 5 5  Ankle dorsiflexion 4+ 4  Ankle plantarflexion    Ankle inversion    Ankle eversion    (Blank rows = not tested)  BED MOBILITY:  Sit to supine Complete Independence Supine to sit Complete Independence  TRANSFERS: Assistive device utilized: None  Sit to stand: SBA Stand to sit: SBA Chair to chair: SBA Floor:  not assessed     CURB:  Level of Assistance: CGA Assistive device utilized:  rail Curb Comments:   STAIRS: Level of Assistance: CGA Stair Negotiation Technique: Step to Pattern with Bilateral Rails Number of Stairs: 4  Height of Stairs:  6  Comments:   GAIT: Gait pattern:  occasional staggering, step through pattern, and narrow BOS Distance walked: 60 Assistive device utilized: None Level of assistance: SBA Comments: staggering in turn and on first bout of  FUNCTIONAL TESTS:  5 times sit to stand: 13.78 Timed up and go (TUG): 10.28sec  6 minute walk test: To be completed  10 meter walk test: 11.86, 13.21sec (0.13m/s) Berg Balance Scale: to be completed  Functional gait assessment: to be completed   PATIENT SURVEYS:  ABC scale 93.375                                                                                                                              TREATMENT DATE: 05/17/2023  Therapy session focused on re-assessment of standardized outcome measures and subjective questionnaire to determine pt's progress with therapy thus far.   Unless otherwise stated, CGA was provided and gait belt donned in order to ensure pt safety throughout session.  10 Meter Walk Test: Patient instructed to walk 10 meters (32.8 ft) as quickly and as safely as possible at their normal speed Results: 0.94 m/s no AD, independently (11.72 seconds and 9.52 seconds)  Cut off scores:   Household Ambulator  < 0.4 m/s  Limited Community Ambulator  0.4 - 0.8 m/s  [employer]  > 0.8 m/s  Increased fall risk  < 1.66m/s  Crossing a Street  >1.20m/s  MCID 0.05 m/s (small), 0.13 m/s (moderate), 0.06 m/s (significant)  (ANPTA Core Set of Outcome Measures for Adults with Neurologic Conditions, 2018)    Five times Sit to Stand Test (FTSS) "Stand up and sit down as quickly as possible 5 times, keeping your arms folded across your chest."    TIME: 12.00 seconds, no UE support  Times > 13.6 seconds is associated with increased disability and morbidity (Guralnik, 2000) Times > 15 seconds is predictive of recurrent falls in healthy individuals aged 54 and older (Buatois, et al., 2008) Normal performance values in community  dwelling individuals aged 68 and older (Bohannon, 2006): 60-69 years: 11.4 seconds 70-79 years: 12.6 seconds 80-89 years: 14.8 seconds  MCID: >= 2.3 seconds for Vestibular Disorders Reed Canes, 2006)   Patient participated in [employer] and demonstrates low fall risk as noted by score of 52/56. (<36= high risk for falls, close to 100%; 37-45 significant >80%; 46-51 moderate >50%; 52-55 lower >25%).  Pt participated in Functional Gait Assessment (FGA) with score of 19/30 demonstrating medium fall risk (low fall risk 25-28, medium fall risk 19-24, and high fall risk <19).   [facility] PT Assessment - 05/17/23 0001       Berg Balance Test   Sit to Stand Able to stand without using hands and stabilize independently    Standing Unsupported Able to stand safely 2 minutes    Sitting with Back Unsupported but Feet Supported on Floor or Stool Able to sit safely and securely 2 minutes    Stand to Sit Sits safely with minimal use of hands    Transfers Able to transfer safely, minor use of hands    Standing Unsupported with Eyes Closed Able to stand 10 seconds safely    Standing Unsupported with Feet Together Able to place feet together independently and stand 1 minute safely    From Standing, Reach Forward with Outstretched Arm Can reach forward >12 cm safely (5")    From Standing Position, Pick up Object from Floor Able to pick up shoe safely and easily    From Standing Position, Turn to Look Behind Over each Shoulder Looks behind from both sides and weight shifts well    Turn 360 Degrees Able to turn 360 degrees safely in 4 seconds or less    Standing Unsupported, Alternately Place Feet on Step/Stool Able to stand independently and safely and complete 8 steps in 20 seconds    Standing Unsupported, One Foot in Front Able to plae foot ahead of the other independently and hold 30 seconds    Standing on One Leg Able to lift leg independently and hold equal to or more than 3 seconds    Total Score 52       Functional Gait  Assessment   Gait assessed  Yes    Gait Level Surface Walks 20 ft in less than 5.5 sec, no assistive devices, good speed, no evidence for imbalance, normal gait pattern, deviates no more than 6 in outside of the 12 in walkway width.    Change  in Gait Speed Able to smoothly change walking speed without loss of balance or gait deviation. Deviate no more than 6 in outside of the 12 in walkway width.    Gait with Horizontal Head Turns Performs head turns smoothly with slight change in gait velocity (eg, minor disruption to smooth gait path), deviates 6-10 in outside 12 in walkway width, or uses an assistive device.    Gait with Vertical Head Turns Performs task with slight change in gait velocity (eg, minor disruption to smooth gait path), deviates 6 - 10 in outside 12 in walkway width or uses assistive device   more challenging than horizontal head turns   Gait and Pivot Turn Pivot turns safely in greater than 3 sec and stops with no loss of balance, or pivot turns safely within 3 sec and stops with mild imbalance, requires small steps to catch balance.    Step Over Obstacle Is able to step over one shoe box (4.5 in total height) but must slow down and adjust steps to clear box safely. May require verbal cueing.    Gait with Narrow Base of Support Ambulates less than 4 steps heel to toe or cannot perform without assistance.    Gait with Eyes Closed Walks 20 ft, uses assistive device, slower speed, mild gait deviations, deviates 6-10 in outside 12 in walkway width. Ambulates 20 ft in less than 9 sec but greater than 7 sec.    Ambulating Backwards Walks 20 ft, uses assistive device, slower speed, mild gait deviations, deviates 6-10 in outside 12 in walkway width.    Steps Alternating feet, must use rail.    Total Score 19             Pt reports her shoes are hurting her toes during session.  Activities-specific Balance Confidence Scale:  Score: 89.375% with pt overall  reporting high confidence with her balance except for walking across a parking lot, stepping on escalator without holding on, and walking in a crowded mall when being bumped into Increased risk of falls in community-dwelling, older adults <80% (79.89%)  0% = no confidence - 100% = complete confidence (ANPTA Core Set of Outcome Measures for Adults with Neurologic Conditions, 2018)  Therapist and pt discussed POC based on her progress thus far and pt in agreement with plan to continue with current appointments until 5/14 and then likely D/C at that time if appropriate.   PATIENT EDUCATION: Education details: exercise technique  Person educated: Patient Education method: Explanation Education comprehension: verbalized understanding  HOME EXERCISE PROGRAM:  Access Code: [email or web address] URL: [email or web address] Date: 03/29/2023 Prepared by: Carlen Chasten  Exercises - Sit to Stand with Arms Crossed  - 1 x daily - 7 x weekly - 2 sets - 10 reps - Standing March with Counter Support  - 1 x daily - 7 x weekly - 2 sets - 10 reps - Standing Romberg to 3/4 Tandem Stance  - 1 x daily - 7 x weekly - 2 sets - 30 seconds hold   GOALS: Goals reviewed with patient? Yes   SHORT TERM GOALS: Target date: 04/09/2023   Patient will be independent in home exercise program to improve strength/mobility for better functional independence with ADLs. Baseline: to be given on visit 2  03/29/2023: provided initial HEP Goal status: IN PROGRESS   LONG TERM GOALS: Target date: 06/04/2023    Patient will increase ABC score  by equal to or greater 3points   to demonstrate statistically significant improvement in mobility and  quality of life.  Baseline: 93.375% 05/17/2023: 89.375% Goal status: IN PROGRESS  2.  Patient (> 71 years old) will complete five times sit to stand test in < 12 seconds indicating an increased LE strength and improved balance. Baseline: 13.78 05/17/2023: 12 seconds Goal status:  IN PROGRESS  3.  Patient will increase Berg Balance score by > 6 points to demonstrate decreased fall risk during functional activities Baseline: 03/29/2023: 39/56 05/17/2023: 52/56 Goal status: MET   4.  Patient will increase 10 meter walk test to >1.30m/s as to improve gait speed for better community ambulation and to reduce fall risk. Baseline: 0.88m/s 05/17/2023: 0.94 m/s no AD  Goal status: IN PROGRESS  5.  Patient will increase 6 min walk test  by 117ft to demonstrate improved balance and tolerance to standing, allowing improved community access and independence.  Baseline: 03/31/2023: 475 feet *had to stop at due to onset of pain in bilateral hips* 05/17/2023: need to assess Goal status: IN PROGRESS  6.  Patient will increase FGA score to >/ 24 as to demonstrate reduced fall risk and improved dynamic gait balance for better safety with community/home ambulation. Baseline: 03/29/2023: 12/30 05/17/2023: 19/30  Goal status: IN PROGRESS   ASSESSMENT:  CLINICAL IMPRESSION:  Therapy session focused on re-assessment of standardized outcome measures and subjective questionnaire to determine pt's progress with therapy thus far. Patient demonstrates significant improvement on all measures. Patient improved Berg Balance Test to 52/56 indicating low fall risk during standing balance tasks and improved 5xSTS to 12 seconds indicating pt at the cut-off to indicate decreased fall risk. Patient also demonstrates significant improvement in her gait speed to right below the fall risk cut-off at 0.49m/s. Patient overall reports high confidence with her balance on ABC, despite it being lower than her initial score. Patient reports decreased confidence with walking across a parking lot, stepping on escalator without holding on, and walking in crowded mall when being bumped into. Plan to continue POC of 2x/week until 5/14 and then discharge at that date if appropriate. Pt will continue to benefit from skilled  therapy to address remaining deficits in order to improve overall QoL and return to PLOF. Patient's condition has the potential to improve in response to therapy. Maximum improvement is yet to be obtained. The anticipated improvement is attainable and reasonable in a generally predictable time.        OBJECTIVE IMPAIRMENTS: Abnormal gait, cardiopulmonary status limiting activity, decreased activity tolerance, decreased balance, decreased coordination, decreased endurance, decreased knowledge of use of DME, decreased mobility, difficulty walking, decreased strength, decreased safety awareness, hypomobility, increased fascial restrictions, and impaired flexibility.   ACTIVITY LIMITATIONS: carrying, lifting, bending, sitting, stairs, and locomotion level  PARTICIPATION LIMITATIONS: cleaning, laundry, driving, shopping, and community activity  PERSONAL FACTORS: Age and 1 comorbidity: hx of back fx and surgery   are also affecting patient's functional outcome.   REHAB POTENTIAL: Good  CLINICAL DECISION MAKING: Stable/uncomplicated  EVALUATION COMPLEXITY: Moderate  PLAN:  PT FREQUENCY: 1-2x/week  PT DURATION: 12 weeks  PLANNED INTERVENTIONS: 97110-Therapeutic exercises, 97530- Therapeutic activity, 97112- Neuromuscular re-education, 97535- Self Care, 16109- Manual therapy, [phone]- Gait training, [phone]- Aquatic Therapy, Patient/Family education, Balance training, Stair training, Taping, Dry Needling, Joint mobilization, Joint manipulation, DME instructions, Cryotherapy, and Moist heat  PLAN FOR NEXT SESSION:  - Need to do from Progress Note: 6 min walk test  - follow-up on aquatic therapy interest - follow up on when she is starting  - progress HEP in preparation for D/C  on 5/14 - dynamic balance including:   -  step-ups/alternating foot taps  - single leg stance  - eyes closed - dynamic gait training including:  - horizontal head turns - obstacle navigation - change in gait  speed     Carlen Chasten, PT, DPT, NCS, CSRS Physical Therapist - [place]  [facility]  5:26 PM 05/17/23

## 2023-05-19 ENCOUNTER — Ambulatory Visit: Payer: Medicare Other

## 2023-05-24 ENCOUNTER — Ambulatory Visit: Payer: Medicare Other | Admitting: Physical Therapy

## 2023-05-26 ENCOUNTER — Ambulatory Visit: Payer: Medicare Other

## 2023-05-26 NOTE — Therapy (Incomplete)
 OUTPATIENT PHYSICAL THERAPY NEURO TREATMENT     Patient Name: Suzanne Snyder MRN: [id number] DOB:[birth year], 73 y.o., female Today's Date: 05/26/2023   PCP: Little Riff, MD  REFERRING PROVIDER:   Little Riff, MD    END OF SESSION:        Past Medical History:  Diagnosis Date   Anxiety    Arthritis    Asthma    Diabetes mellitus without complication (HCC)    Insulin  pump in place    Osteoporosis    RLS (restless legs syndrome)    Smokers' cough (HCC)    Wears dentures    full upper and lower   Past Surgical History:  Procedure Laterality Date   CARPAL TUNNEL RELEASE Left 11/20/2022   Procedure: CARPAL TUNNEL RELEASE;  Surgeon: Venus Ginsberg, MD;  Location: [facility];  Service: Orthopedics;  Laterality: Left;   CATARACT EXTRACTION W/PHACO Right 02/20/2020   Procedure: CATARACT EXTRACTION PHACO AND INTRAOCULAR LENS PLACEMENT (IOC) RIGHT 5.60 00:36.3;  Surgeon: Clair Crews, MD;  Location: [facility];  Service: Ophthalmology;  Laterality: Right;  Diabetic - insulin  pump Requests not first   CATARACT EXTRACTION W/PHACO Left 03/05/2020   Procedure: CATARACT EXTRACTION PHACO AND INTRAOCULAR LENS PLACEMENT (IOC) LEFT 4.94 00:31.6;  Surgeon: Clair Crews, MD;  Location: [facility];  Service: Ophthalmology;  Laterality: Left;   HIP PINNING,CANNULATED Right 02/09/2022   Procedure: PERCUTANEOUS FIXATION OF FEMORAL NECK;  Surgeon: Marlynn Singer, MD;  Location: [facility];  Service: Orthopedics;  Laterality: Right;   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   ROTATOR CUFF REPAIR Left 2014 and 2015   SPINE SURGERY N/A 1984   L4-5, not sure of procedure   THYROID  SURGERY     Patient Active Problem List   Diagnosis Date Noted   Pain management contract signed 04/20/2023   Generalized anxiety disorder 11/29/2022   RLS (restless legs syndrome) 11/29/2022   Acute metabolic encephalopathy 11/19/2022   Pain and swelling of left wrist  11/19/2022   Fall 11/19/2022   Suppurative tenosynovitis of flexor tendon of left hand 11/19/2022   Rib pain on left side 08/25/2022   SBO (small bowel obstruction) (HCC) 05/06/2022   Small bowel obstruction (HCC) 05/06/2022   Portal vein obstruction 05/06/2022   Right hip pain 02/14/2022   Hip pain, acute, right 02/13/2022   Electrolyte abnormality 02/13/2022   Anemia 02/13/2022   Malnutrition of moderate degree 02/10/2022   Closed right hip fracture (HCC) 02/08/2022   Hypomagnesemia 02/08/2022   Cellulitis of right ankle 02/08/2022   Hyponatremia 02/08/2022   Tobacco abuse 02/08/2022   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) 02/08/2022   Lumbar facet arthropathy 11/27/2021   Lumbar degenerative disc disease 11/27/2021   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/27/2021   Chronic left shoulder pain 11/27/2021   Cervical facet joint syndrome 11/27/2021   Cervical radicular pain 11/27/2021   Chronic pain syndrome 11/27/2021   Cellulitis of right wrist 12/21/2020   Cellulitis of right thigh 12/11/2020   Hypoglycemia 12/10/2020    ONSET DATE: >1year   REFERRING DIAG:  Diagnosis  R26.81 (ICD-10-CM) - Gait instability    THERAPY DIAG:  No diagnosis found.  Rationale for Evaluation and Treatment: Rehabilitation  SUBJECTIVE:  SUBJECTIVE STATEMENT:   Pt reports she was really sick lat week, likely with the stomach flu. Pt reports her BS device was not communicating correctly 2 days ago and her BS spiked close to 400. States this morning when she woke up her BS was 38. Therapist encouraged pt to follow-up with physician regarding this and reinforced education on [facility]. Pt reports BS was 127 approximately 1 hour ago.  Denies falls/stumbles since last  visit. No specific complaints of pain during session except for discomfort from her shoes.  Pt accompanied by: self     PERTINENT HISTORY:  PMH anxiety, arthritis, DM, osteoporosis RLS, hx L total hip 2007, RTC repair L 2014 and 2015, spine surgery 1984. Pt arrived late to scheduled evaluation and is extremely verbose and tangential throughout evaluation.  Recently in ED 12/10/22 with low blood sugar, reported at time she had not been eating much. Pt reports that in 2023 sustained femur fracture, After not taking care of her self while caring for husband that was struggling with Dementia. She States that she has been slow to get going and gets tired extremely quickly. reports that her lower back and hips hurt constantly when on her feet.  Reports significant pain in BLE from chronic fx and truama.   PAIN:  Are you having pain? Yes: NPRS scale: 7.5/10  Pain location: lower back and hips  Pain description: "vivid" constant   Aggravating factors: walking  Relieving factors: mild improvement with tramadol , has become less affective over the last year ot 2.   PRECAUTIONS: Fall  RED FLAGS: Compression fracture: Yes: spinal surgrey in 1983   WEIGHT BEARING RESTRICTIONS: No  FALLS: Has patient fallen in last 6 months? No and a few near falls   LIVING ENVIRONMENT: Lives with: lives alone Lives in: House/apartment Stairs: Yes: Internal: 14 steps; on right going up and External: 6 steps; on right going up Has following equipment at home: Wheelchair (manual) and walking sticks.   PLOF: Independent with basic ADLs and Independent with household mobility without device  PATIENT GOALS: be on feet for longer time. Less pain.   OBJECTIVE:  Note: Objective measures were completed at Evaluation unless otherwise noted.  DIAGNOSTIC FINDINGS:   CT cervical spine   IMPRESSION: Moderate to advanced degenerative disc and facet disease.   No acute bony abnormality.    COGNITION: Overall  cognitive status: Within functional limits for tasks assessed   SENSATION: WFL  COORDINATION: WFL with testing. Mild staggering of gait.   EDEMA:  Mild edema in Bil hands and L   MUSCLE TONE: WFL    POSTURE: rounded shoulders, forward head, and decreased lumbar lordosis  LOWER EXTREMITY ROM:     Grossly WFL.  Limited trunk and lumbar extension to 5 deg.  Trunk flexion WFL.  Mild instability with trunk rotation, but grossly WFL ROM.    LOWER EXTREMITY MMT:    MMT Right Eval Left Eval  Hip flexion 4+ 4  Hip extension 4 4  Hip abduction 4+ 4+  Hip adduction 5 5  Hip internal rotation    Hip external rotation    Knee flexion 4+ 4+  Knee extension 5 5  Ankle dorsiflexion 4+ 4  Ankle plantarflexion    Ankle inversion    Ankle eversion    (Blank rows = not tested)  BED MOBILITY:  Sit to supine Complete Independence Supine to sit Complete Independence  TRANSFERS: Assistive device utilized: None  Sit to stand: SBA Stand to sit:  SBA Chair to chair: SBA Floor: not assessed     CURB:  Level of Assistance: CGA Assistive device utilized: rail Curb Comments:   STAIRS: Level of Assistance: CGA Stair Negotiation Technique: Step to Pattern with Bilateral Rails Number of Stairs: 4  Height of Stairs: 6  Comments:   GAIT: Gait pattern: occasional staggering, step through pattern, and narrow BOS Distance walked: 60 Assistive device utilized: None Level of assistance: SBA Comments: staggering in turn and on first bout of  FUNCTIONAL TESTS:  5 times sit to stand: 13.78 Timed up and go (TUG): 10.28sec  6 minute walk test: To be completed  10 meter walk test: 11.86, 13.21sec (0.51m/s) Berg Balance Scale: to be completed  Functional gait assessment: to be completed   PATIENT SURVEYS:  ABC scale 93.375                                                                                                                              TREATMENT DATE:  05/26/2023  *** -     PREVOIUS Therapy session focused on re-assessment of standardized outcome measures and subjective questionnaire to determine pt's progress with therapy thus far.   Unless otherwise stated, CGA was provided and gait belt donned in order to ensure pt safety throughout session.  10 Meter Walk Test: Patient instructed to walk 10 meters (32.8 ft) as quickly and as safely as possible at their normal speed Results: 0.94 m/s no AD, independently (11.72 seconds and 9.52 seconds)  Cut off scores:   Household Ambulator  < 0.4 m/s  Limited Community Ambulator  0.4 - 0.8 m/s  [employer]  > 0.8 m/s  Increased fall risk  < 1.74m/s  Crossing a Street  >1.66m/s  MCID 0.05 m/s (small), 0.13 m/s (moderate), 0.06 m/s (significant)  (ANPTA Core Set of Outcome Measures for Adults with Neurologic Conditions, 2018)    Five times Sit to Stand Test (FTSS) "Stand up and sit down as quickly as possible 5 times, keeping your arms folded across your chest."    TIME: 12.00 seconds, no UE support  Times > 13.6 seconds is associated with increased disability and morbidity (Guralnik, 2000) Times > 15 seconds is predictive of recurrent falls in healthy individuals aged 89 and older (Buatois, et al., 2008) Normal performance values in community dwelling individuals aged 95 and older (Bohannon, 2006): 60-69 years: 11.4 seconds 70-79 years: 12.6 seconds 80-89 years: 14.8 seconds  MCID: >= 2.3 seconds for Vestibular Disorders Reed Canes, 2006)   Patient participated in [employer] and demonstrates low fall risk as noted by score of 52/56. (<36= high risk for falls, close to 100%; 37-45 significant >80%; 46-51 moderate >50%; 52-55 lower >25%).  Pt participated in Functional Gait Assessment (FGA) with score of 19/30 demonstrating medium fall risk (low fall risk 25-28, medium fall risk 19-24, and high fall risk <19).     Pt reports her shoes are hurting her toes during  session.  Activities-specific Balance Confidence Scale:  Score: 89.375% with pt overall reporting high confidence with her balance except for walking across a parking lot, stepping on escalator without holding on, and walking in a crowded mall when being bumped into Increased risk of falls in community-dwelling, older adults <80% (79.89%)  0% = no confidence - 100% = complete confidence (ANPTA Core Set of Outcome Measures for Adults with Neurologic Conditions, 2018)  Therapist and pt discussed POC based on her progress thus far and pt in agreement with plan to continue with current appointments until 5/14 and then likely D/C at that time if appropriate.   PATIENT EDUCATION: Education details: exercise technique  Person educated: Patient Education method: Explanation Education comprehension: verbalized understanding  HOME EXERCISE PROGRAM:  Access Code: [email or web address] URL: [email or web address] Date: 03/29/2023 Prepared by: Carlen Chasten  Exercises - Sit to Stand with Arms Crossed  - 1 x daily - 7 x weekly - 2 sets - 10 reps - Standing March with Counter Support  - 1 x daily - 7 x weekly - 2 sets - 10 reps - Standing Romberg to 3/4 Tandem Stance  - 1 x daily - 7 x weekly - 2 sets - 30 seconds hold   GOALS: Goals reviewed with patient? Yes   SHORT TERM GOALS: Target date: 04/09/2023   Patient will be independent in home exercise program to improve strength/mobility for better functional independence with ADLs. Baseline: to be given on visit 2  03/29/2023: provided initial HEP Goal status: IN PROGRESS   LONG TERM GOALS: Target date: 06/04/2023    Patient will increase ABC score  by equal to or greater 3points   to demonstrate statistically significant improvement in mobility and quality of life.  Baseline: 93.375% 05/17/2023: 89.375% Goal status: IN PROGRESS  2.  Patient (> 22 years old) will complete five times sit to stand test in < 12 seconds indicating an  increased LE strength and improved balance. Baseline: 13.78 05/17/2023: 12 seconds Goal status: IN PROGRESS  3.  Patient will increase Berg Balance score by > 6 points to demonstrate decreased fall risk during functional activities Baseline: 03/29/2023: 39/56 05/17/2023: 52/56 Goal status: MET   4.  Patient will increase 10 meter walk test to >1.24m/s as to improve gait speed for better community ambulation and to reduce fall risk. Baseline: 0.44m/s 05/17/2023: 0.94 m/s no AD  Goal status: IN PROGRESS  5.  Patient will increase 6 min walk test  by 123ft to demonstrate improved balance and tolerance to standing, allowing improved community access and independence.  Baseline: 03/31/2023: 475 feet *had to stop at due to onset of pain in bilateral hips* 05/17/2023: need to assess Goal status: IN PROGRESS  6.  Patient will increase FGA score to >/ 24 as to demonstrate reduced fall risk and improved dynamic gait balance for better safety with community/home ambulation. Baseline: 03/29/2023: 12/30 05/17/2023: 19/30  Goal status: IN PROGRESS   ASSESSMENT:  CLINICAL IMPRESSION:  Therapy session focused on re-assessment of standardized outcome measures and subjective questionnaire to determine pt's progress with therapy thus far. Patient demonstrates significant improvement on all measures. Patient improved Berg Balance Test to 52/56 indicating low fall risk during standing balance tasks and improved 5xSTS to 12 seconds indicating pt at the cut-off to indicate decreased fall risk. Patient also demonstrates significant improvement in her gait speed to right below the fall risk cut-off at 0.98m/s. Patient overall reports high confidence with her balance on ABC, despite it being lower  than her initial score. Patient reports decreased confidence with walking across a parking lot, stepping on escalator without holding on, and walking in crowded mall when being bumped into. Plan to continue POC of 2x/week until  5/14 and then discharge at that date if appropriate. Pt will continue to benefit from skilled therapy to address remaining deficits in order to improve overall QoL and return to PLOF. Patient's condition has the potential to improve in response to therapy. Maximum improvement is yet to be obtained. The anticipated improvement is attainable and reasonable in a generally predictable time.        OBJECTIVE IMPAIRMENTS: Abnormal gait, cardiopulmonary status limiting activity, decreased activity tolerance, decreased balance, decreased coordination, decreased endurance, decreased knowledge of use of DME, decreased mobility, difficulty walking, decreased strength, decreased safety awareness, hypomobility, increased fascial restrictions, and impaired flexibility.   ACTIVITY LIMITATIONS: carrying, lifting, bending, sitting, stairs, and locomotion level  PARTICIPATION LIMITATIONS: cleaning, laundry, driving, shopping, and community activity  PERSONAL FACTORS: Age and 1 comorbidity: hx of back fx and surgery  are also affecting patient's functional outcome.   REHAB POTENTIAL: Good  CLINICAL DECISION MAKING: Stable/uncomplicated  EVALUATION COMPLEXITY: Moderate  PLAN:  PT FREQUENCY: 1-2x/week  PT DURATION: 12 weeks  PLANNED INTERVENTIONS: 97110-Therapeutic exercises, 97530- Therapeutic activity, 97112- Neuromuscular re-education, 97535- Self Care, [id number]- Manual therapy, [phone]- Gait training, [phone]- Aquatic Therapy, Patient/Family education, Balance training, Stair training, Taping, Dry Needling, Joint mobilization, Joint manipulation, DME instructions, Cryotherapy, and Moist heat  PLAN FOR NEXT SESSION:  - Need to do from Progress Note: 6 min walk test  - follow-up on aquatic therapy interest - follow up on when she is starting  - progress HEP in preparation for D/C on 5/14 - dynamic balance including:   -  step-ups/alternating foot taps  - single leg stance  - eyes closed - dynamic gait  training including:  - horizontal head turns - obstacle navigation - change in gait speed     Carlen Chasten, PT, DPT, NCS, CSRS Physical Therapist - [place]  [facility]  8:41 AM 05/26/23

## 2023-06-15 ENCOUNTER — Ambulatory Visit: Payer: Medicare Other | Admitting: Dermatology

## 2023-06-22 ENCOUNTER — Ambulatory Visit: Attending: Internal Medicine | Admitting: Physical Therapy

## 2023-06-22 NOTE — Therapy (Incomplete)
 OUTPATIENT PHYSICAL THERAPY NEURO TREATMENT/RECERT ***     Patient Name: Suzanne Snyder MRN: [id number] DOB:1950-05-11, 73 y.o., female Today's Date: 06/22/2023   PCP: Little Riff, MD  REFERRING PROVIDER:   Little Riff, MD    END OF SESSION: ***       Past Medical History:  Diagnosis Date   Anxiety    Arthritis    Asthma    Diabetes mellitus without complication (HCC)    Insulin  pump in place    Osteoporosis    RLS (restless legs syndrome)    Smokers' cough (HCC)    Wears dentures    full upper and lower   Past Surgical History:  Procedure Laterality Date   CARPAL TUNNEL RELEASE Left 11/20/2022   Procedure: CARPAL TUNNEL RELEASE;  Surgeon: Venus Ginsberg, MD;  Location: [facility];  Service: Orthopedics;  Laterality: Left;   CATARACT EXTRACTION W/PHACO Right 02/20/2020   Procedure: CATARACT EXTRACTION PHACO AND INTRAOCULAR LENS PLACEMENT (IOC) RIGHT 5.60 00:36.3;  Surgeon: Clair Crews, MD;  Location: [facility];  Service: Ophthalmology;  Laterality: Right;  Diabetic - insulin  pump Requests not first   CATARACT EXTRACTION W/PHACO Left 03/05/2020   Procedure: CATARACT EXTRACTION PHACO AND INTRAOCULAR LENS PLACEMENT (IOC) LEFT 4.94 00:31.6;  Surgeon: Clair Crews, MD;  Location: [facility];  Service: Ophthalmology;  Laterality: Left;   HIP PINNING,CANNULATED Right 02/09/2022   Procedure: PERCUTANEOUS FIXATION OF FEMORAL NECK;  Surgeon: Marlynn Singer, MD;  Location: [facility];  Service: Orthopedics;  Laterality: Right;   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   ROTATOR CUFF REPAIR Left 2014 and 2015   SPINE SURGERY N/A 1984   L4-5, not sure of procedure   THYROID SURGERY     Patient Active Problem List   Diagnosis Date Noted   Pain management contract signed 04/20/2023   Generalized anxiety disorder 11/29/2022   RLS (restless legs syndrome) 11/29/2022   Acute metabolic encephalopathy 11/19/2022   Pain and swelling of left  wrist 11/19/2022   Fall 11/19/2022   Suppurative tenosynovitis of flexor tendon of left hand 11/19/2022   Rib pain on left side 08/25/2022   SBO (small bowel obstruction) (HCC) 05/06/2022   Small bowel obstruction (HCC) 05/06/2022   Portal vein obstruction 05/06/2022   Right hip pain 02/14/2022   Hip pain, acute, right 02/13/2022   Electrolyte abnormality 02/13/2022   Anemia 02/13/2022   Malnutrition of moderate degree 02/10/2022   Closed right hip fracture (HCC) 02/08/2022   Hypomagnesemia 02/08/2022   Cellulitis of right ankle 02/08/2022   Hyponatremia 02/08/2022   Tobacco abuse 02/08/2022   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) 02/08/2022   Lumbar facet arthropathy 11/27/2021   Lumbar degenerative disc disease 11/27/2021   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/27/2021   Chronic left shoulder pain 11/27/2021   Cervical facet joint syndrome 11/27/2021   Cervical radicular pain 11/27/2021   Chronic pain syndrome 11/27/2021   Cellulitis of right wrist 12/21/2020   Cellulitis of right thigh 12/11/2020   Hypoglycemia 12/10/2020    ONSET DATE: >1year   REFERRING DIAG:  Diagnosis  R26.81 (ICD-10-CM) - Gait instability    THERAPY DIAG: *** No diagnosis found.  Rationale for Evaluation and Treatment: Rehabilitation  SUBJECTIVE:  SUBJECTIVE STATEMENT:   ***  Pt reports she was really sick lat week, likely with the stomach flu. Pt reports her BS device was not communicating correctly 2 days ago and her BS spiked close to 400. States this morning when she woke up her BS was 38. Therapist encouraged pt to follow-up with physician regarding this and reinforced education on [facility]. Pt reports BS was 127 approximately 1 hour ago.  Denies  falls/stumbles since last visit. No specific complaints of pain during session except for discomfort from her shoes.  ***   Pt accompanied by: self     PERTINENT HISTORY:  PMH anxiety, arthritis, DM, osteoporosis RLS, hx L total hip 2007, RTC repair L 2014 and 2015, spine surgery 1984. Pt arrived late to scheduled evaluation and is extremely verbose and tangential throughout evaluation.  Recently in ED 12/10/22 with low blood sugar, reported at time she had not been eating much. Pt reports that in 2023 sustained femur fracture, After not taking care of her self while caring for husband that was struggling with Dementia. She States that she has been slow to get going and gets tired extremely quickly. reports that her lower back and hips hurt constantly when on her feet.  Reports significant pain in BLE from chronic fx and truama.   PAIN:  Are you having pain? Yes: NPRS scale: 7.5/10  Pain location: lower back and hips  Pain description: "vivid" constant   Aggravating factors: walking  Relieving factors: mild improvement with tramadol , has become less affective over the last year ot 2.   PRECAUTIONS: Fall  RED FLAGS: Compression fracture: Yes: spinal surgrey in 1983   WEIGHT BEARING RESTRICTIONS: No  FALLS: Has patient fallen in last 6 months? No and a few near falls   LIVING ENVIRONMENT: Lives with: lives alone Lives in: House/apartment Stairs: Yes: Internal: 14 steps; on right going up and External: 6 steps; on right going up Has following equipment at home: Wheelchair (manual) and walking sticks.   PLOF: Independent with basic ADLs and Independent with household mobility without device  PATIENT GOALS: be on feet for longer time. Less pain.   OBJECTIVE:  Note: Objective measures were completed at Evaluation unless otherwise noted.  DIAGNOSTIC FINDINGS:   CT cervical spine   IMPRESSION: Moderate to advanced degenerative disc and facet disease.   No acute bony  abnormality.    COGNITION: Overall cognitive status: Within functional limits for tasks assessed   SENSATION: WFL  COORDINATION: WFL with testing. Mild staggering of gait.   EDEMA:  Mild edema in Bil hands and L   MUSCLE TONE: WFL    POSTURE: rounded shoulders, forward head, and decreased lumbar lordosis  LOWER EXTREMITY ROM:     Grossly WFL.  Limited trunk and lumbar extension to 5 deg.  Trunk flexion WFL.  Mild instability with trunk rotation, but grossly WFL ROM.    LOWER EXTREMITY MMT:    MMT Right Eval Left Eval  Hip flexion 4+ 4  Hip extension 4 4  Hip abduction 4+ 4+  Hip adduction 5 5  Hip internal rotation    Hip external rotation    Knee flexion 4+ 4+  Knee extension 5 5  Ankle dorsiflexion 4+ 4  Ankle plantarflexion    Ankle inversion    Ankle eversion    (Blank rows = not tested)  BED MOBILITY:  Sit to supine Complete Independence Supine to sit Complete Independence  TRANSFERS: Assistive device utilized: None  Sit to  stand: SBA Stand to sit: SBA Chair to chair: SBA Floor: not assessed     CURB:  Level of Assistance: CGA Assistive device utilized: rail Curb Comments:   STAIRS: Level of Assistance: CGA Stair Negotiation Technique: Step to Pattern with Bilateral Rails Number of Stairs: 4  Height of Stairs: 6  Comments:   GAIT: Gait pattern: occasional staggering, step through pattern, and narrow BOS Distance walked: 60 Assistive device utilized: None Level of assistance: SBA Comments: staggering in turn and on first bout of  FUNCTIONAL TESTS:  5 times sit to stand: 13.78 Timed up and go (TUG): 10.28sec  6 minute walk test: To be completed  10 meter walk test: 11.86, 13.21sec (0.48m/s) Berg Balance Scale: to be completed  Functional gait assessment: to be completed   PATIENT SURVEYS:  ABC scale 93.375                                                                                                                               TREATMENT DATE: 06/22/2023  *** -     PREVOIUS Therapy session focused on re-assessment of standardized outcome measures and subjective questionnaire to determine pt's progress with therapy thus far.   Unless otherwise stated, CGA was provided and gait belt donned in order to ensure pt safety throughout session.  10 Meter Walk Test: Patient instructed to walk 10 meters (32.8 ft) as quickly and as safely as possible at their normal speed Results: 0.94 m/s no AD, independently (11.72 seconds and 9.52 seconds)  Cut off scores:   Household Ambulator  < 0.4 m/s  Limited Community Ambulator  0.4 - 0.8 m/s  [employer]  > 0.8 m/s  Increased fall risk  < 1.24m/s  Crossing a Street  >1.55m/s  MCID 0.05 m/s (small), 0.13 m/s (moderate), 0.06 m/s (significant)  (ANPTA Core Set of Outcome Measures for Adults with Neurologic Conditions, 2018)    Five times Sit to Stand Test (FTSS) "Stand up and sit down as quickly as possible 5 times, keeping your arms folded across your chest."    TIME: 12.00 seconds, no UE support  Times > 13.6 seconds is associated with increased disability and morbidity (Guralnik, 2000) Times > 15 seconds is predictive of recurrent falls in healthy individuals aged 19 and older (Buatois, et al., 2008) Normal performance values in community dwelling individuals aged 37 and older (Bohannon, 2006): 60-69 years: 11.4 seconds 70-79 years: 12.6 seconds 80-89 years: 14.8 seconds  MCID: >= 2.3 seconds for Vestibular Disorders Reed Canes, 2006)   Patient participated in [employer] and demonstrates low fall risk as noted by score of 52/56. (<36= high risk for falls, close to 100%; 37-45 significant >80%; 46-51 moderate >50%; 52-55 lower >25%).  Pt participated in Functional Gait Assessment (FGA) with score of 19/30 demonstrating medium fall risk (low fall risk 25-28, medium fall risk 19-24, and high fall risk <19).     Pt reports her shoes  are hurting her toes during session.  Activities-specific Balance Confidence Scale:  Score: 89.375% with pt overall reporting high confidence with her balance except for walking across a parking lot, stepping on escalator without holding on, and walking in a crowded mall when being bumped into Increased risk of falls in community-dwelling, older adults <80% (79.89%)  0% = no confidence - 100% = complete confidence (ANPTA Core Set of Outcome Measures for Adults with Neurologic Conditions, 2018)  Therapist and pt discussed POC based on her progress thus far and pt in agreement with plan to continue with current appointments until 5/14 and then likely D/C at that time if appropriate.  ***   PATIENT EDUCATION: Education details: exercise technique  Person educated: Patient Education method: Explanation Education comprehension: verbalized understanding  HOME EXERCISE PROGRAM:  Access Code: [email or web address] URL: [email or web address] Date: 03/29/2023 Prepared by: Carlen Chasten  Exercises - Sit to Stand with Arms Crossed  - 1 x daily - 7 x weekly - 2 sets - 10 reps - Standing March with Counter Support  - 1 x daily - 7 x weekly - 2 sets - 10 reps - Standing Romberg to 3/4 Tandem Stance  - 1 x daily - 7 x weekly - 2 sets - 30 seconds hold   GOALS: Goals reviewed with patient? Yes   SHORT TERM GOALS: Target date: 04/09/2023   Patient will be independent in home exercise program to improve strength/mobility for better functional independence with ADLs. Baseline: to be given on visit 2  03/29/2023: provided initial HEP Goal status: IN PROGRESS   LONG TERM GOALS: Target date: 06/04/2023    Patient will increase ABC score  by equal to or greater 3points   to demonstrate statistically significant improvement in mobility and quality of life.  Baseline: 93.375% 05/17/2023: 89.375% Goal status: IN PROGRESS  2.  Patient (> 52 years old) will complete five times sit to stand test  in < 12 seconds indicating an increased LE strength and improved balance. Baseline: 13.78 05/17/2023: 12 seconds Goal status: IN PROGRESS  3.  Patient will increase Berg Balance score by > 6 points to demonstrate decreased fall risk during functional activities Baseline: 03/29/2023: 39/56 05/17/2023: 52/56 Goal status: MET   4.  Patient will increase 10 meter walk test to >1.42m/s as to improve gait speed for better community ambulation and to reduce fall risk. Baseline: 0.51m/s 05/17/2023: 0.94 m/s no AD  Goal status: IN PROGRESS  5.  Patient will increase 6 min walk test  by 161ft to demonstrate improved balance and tolerance to standing, allowing improved community access and independence.  Baseline: 03/31/2023: 475 feet *had to stop at due to onset of pain in bilateral hips* 05/17/2023: need to assess Goal status: IN PROGRESS  6.  Patient will increase FGA score to >/ 24 as to demonstrate reduced fall risk and improved dynamic gait balance for better safety with community/home ambulation. Baseline: 03/29/2023: 12/30 05/17/2023: 19/30  Goal status: IN PROGRESS   ASSESSMENT:  CLINICAL IMPRESSION: *** Therapy session focused on re-assessment of standardized outcome measures and subjective questionnaire to determine pt's progress with therapy thus far. Patient demonstrates significant improvement on all measures. Patient improved Berg Balance Test to 52/56 indicating low fall risk during standing balance tasks and improved 5xSTS to 12 seconds indicating pt at the cut-off to indicate decreased fall risk. Patient also demonstrates significant improvement in her gait speed to right below the fall risk cut-off at 0.48m/s. Patient overall reports high confidence with her  balance on ABC, despite it being lower than her initial score. Patient reports decreased confidence with walking across a parking lot, stepping on escalator without holding on, and walking in crowded mall when being bumped into. Plan  to continue POC of 2x/week until 5/14 and then discharge at that date if appropriate. Pt will continue to benefit from skilled therapy to address remaining deficits in order to improve overall QoL and return to PLOF. Patient's condition has the potential to improve in response to therapy. Maximum improvement is yet to be obtained. The anticipated improvement is attainable and reasonable in a generally predictable time.        OBJECTIVE IMPAIRMENTS: Abnormal gait, cardiopulmonary status limiting activity, decreased activity tolerance, decreased balance, decreased coordination, decreased endurance, decreased knowledge of use of DME, decreased mobility, difficulty walking, decreased strength, decreased safety awareness, hypomobility, increased fascial restrictions, and impaired flexibility.   ACTIVITY LIMITATIONS: carrying, lifting, bending, sitting, stairs, and locomotion level  PARTICIPATION LIMITATIONS: cleaning, laundry, driving, shopping, and community activity  PERSONAL FACTORS: Age and 1 comorbidity: hx of back fx and surgery  are also affecting patient's functional outcome.   REHAB POTENTIAL: Good  CLINICAL DECISION MAKING: Stable/uncomplicated  EVALUATION COMPLEXITY: Moderate  PLAN:  PT FREQUENCY: 1-2x/week  PT DURATION: 12 weeks  PLANNED INTERVENTIONS: 97110-Therapeutic exercises, 97530- Therapeutic activity, 97112- Neuromuscular re-education, 97535- Self Care, 09811- Manual therapy, [phone]- Gait training, [phone]- Aquatic Therapy, Patient/Family education, Balance training, Stair training, Taping, Dry Needling, Joint mobilization, Joint manipulation, DME instructions, Cryotherapy, and Moist heat  PLAN FOR NEXT SESSION: *** - Need to do from Progress Note: 6 min walk test  - follow-up on aquatic therapy interest - follow up on when she is starting  - progress HEP in preparation for D/C on 5/14 - dynamic balance including:   -  step-ups/alternating foot taps  - single leg  stance  - eyes closed - dynamic gait training including:  - horizontal head turns - obstacle navigation - change in gait speed     Lela Murfin, PT, DPT, NCS, CSRS Physical Therapist - [place]  [facility]  1:03 PM 06/22/23

## 2023-07-01 ENCOUNTER — Emergency Department

## 2023-07-01 ENCOUNTER — Other Ambulatory Visit: Payer: Self-pay

## 2023-07-01 ENCOUNTER — Emergency Department: Admission: EM | Admit: 2023-07-01 | Discharge: 2023-07-01 | Disposition: A | Discharge status: Home / Self Care

## 2023-07-01 DIAGNOSIS — S52502A Unspecified fracture of the lower end of left radius, initial encounter for closed fracture: Secondary | ICD-10-CM | POA: Diagnosis not present

## 2023-07-01 DIAGNOSIS — W01198A Fall on same level from slipping, tripping and stumbling with subsequent striking against other object, initial encounter: Secondary | ICD-10-CM | POA: Diagnosis not present

## 2023-07-01 DIAGNOSIS — S6992XA Unspecified injury of left wrist, hand and finger(s), initial encounter: Secondary | ICD-10-CM | POA: Diagnosis present

## 2023-07-01 DIAGNOSIS — E109 Type 1 diabetes mellitus without complications: Secondary | ICD-10-CM | POA: Diagnosis not present

## 2023-07-01 MED ORDER — OXYCODONE HCL 5 MG PO TABS
5.0000 mg | ORAL_TABLET | Freq: Once | ORAL | Status: AC
  Administered 2023-07-01: 5 mg via ORAL
  Filled 2023-07-01: qty 1

## 2023-07-01 MED ORDER — OXYCODONE HCL 5 MG PO TABS: 5.0000 mg | ORAL_TABLET | Freq: Four times a day (QID) | ORAL | 0 refills | Status: DC | PRN

## 2023-07-01 NOTE — ED Provider Notes (Signed)
 [facility] Provider Note    Event Date/Time   First MD Initiated Contact with Patient 07/01/23 1224     (approximate)   History   Wrist Pain   HPI  Suzanne Snyder is a 73 y.o. female   presents to the ED with complaint of left wrist pain after a fall that occurred last evening.  Patient states that she is continue to have some swelling and increased pain.  She denies any head injury or loss of consciousness while falling.  Patient has history of diabetes type 1, chronic pain, small bowel anxiety disorder, RLS and pain management contract.      Physical Exam   Triage Vital Signs: ED Triage Vitals [07/01/23 1204]  Encounter Vitals Group     BP 132/70     Girls Systolic BP Percentile      Girls Diastolic BP Percentile      Boys Systolic BP Percentile      Boys Diastolic BP Percentile      Pulse Rate 85     Resp 20     Temp 98.3 F (36.8 C)     Temp Source Oral     SpO2 98 %     Weight 109 lb (49.4 kg)     Height 5' 8 (1.727 m)     Head Circumference      Peak Flow      Pain Score 8     Pain Loc      Pain Education      Exclude from Growth Chart     Most recent vital signs: Vitals:   07/01/23 1204  BP: 132/70  Pulse: 85  Resp: 20  Temp: 98.3 F (36.8 C)  SpO2: 98%     General: Awake, no distress.  CV:  Good peripheral perfusion.  Resp:  Normal effort.  Abd:  No distention.  Other:  Left wrist on exam is moderately tender to light palpation with soft tissue edema present.  Skin is intact.  Radial pulse present.  Patient is able to move digits distally slowly which increases her pain.  Cap refills less than 3 seconds.  Motor or sensory function intact.  Range of motion with flexion extension is moderately restricted secondary to increased pain.  Nontender forearm.   ED Results / Procedures / Treatments   Labs (all labs ordered are listed, but only abnormal results are displayed) Labs Reviewed - No data to  display   RADIOLOGY X-ray images of the left wrist were reviewed and interpreted by myself independent of the radiologist with a nondisplaced fracture noted to the distal radius.  Official radiology report also mentions a mildly displaced buckle fracture of the distal radius.    PROCEDURES:  Critical Care performed:   Procedures   MEDICATIONS ORDERED IN ED: Medications  oxyCODONE  (Oxy IR/ROXICODONE ) immediate release tablet 5 mg (5 mg Oral Given 07/01/23 1250)     IMPRESSION / MDM / ASSESSMENT AND PLAN / ED COURSE  I reviewed the triage vital signs and the nursing notes.   Differential diagnosis includes, but is not limited to, sprain left wrist, contusion, fracture, dislocation.  73 year old female presents to the ED after mechanical fall that occurred last evening.  Patient has continued to have left wrist pain.  Physical exam is suspicious for fracture and x-ray does show a buckle fracture to the distal radius.  A wrist splint was applied and patient was made aware that she needs to ice and elevate  to help reduce some of the swelling and wear the splint to provide support and protection.  She was given oxycodone  while in the emergency department and a prescription for the same was sent to the pharmacy.  She states that she does not have any pain medication at home at this time.  She is referred to [facility] orthopedic department for follow-up and encouraged to call today or tomorrow to make that appointment.      Patient's presentation is most consistent with acute complicated illness / injury requiring diagnostic workup.  FINAL CLINICAL IMPRESSION(S) / ED DIAGNOSES   Final diagnoses:  Closed fracture of distal end of left radius, unspecified fracture morphology, initial encounter     Rx / DC Orders   ED Discharge Orders          Ordered    oxyCODONE  (OXY IR/ROXICODONE ) 5 MG immediate release tablet  Every 6 hours PRN        07/01/23 1412              Note:  This document was prepared using Dragon voice recognition software and may include unintentional dictation errors.   Saunders Shona CROME, PA-C 07/01/23 1425    Clarine Ozell LABOR, MD 07/03/23 [phone]

## 2023-07-01 NOTE — ED Triage Notes (Signed)
 Pt to ED via POV from home. Pt reports fell and hit left wrist last pm. Pt has swelling and redness noted to left wrist. Radial pulse present and strong.

## 2023-07-01 NOTE — Discharge Instructions (Signed)
 Call make an appointment with Dr. Clyda Dark,  who is on-call for orthopedics.  Wear the splint until you are seen.  Ice and elevation to reduce swelling which will also help with pain.  A prescription for pain medication was sent to your pharmacy to take as needed for moderate to severe pain.  You may also take Aleve or ibuprofen  with this medication if additional pain medication is needed.

## 2023-07-03 ENCOUNTER — Emergency Department
Admission: EM | Admit: 2023-07-03 | Discharge: 2023-07-03 | Disposition: A | Discharge status: Home / Self Care | Attending: Emergency Medicine | Admitting: Emergency Medicine

## 2023-07-03 ENCOUNTER — Emergency Department

## 2023-07-03 ENCOUNTER — Other Ambulatory Visit: Payer: Self-pay

## 2023-07-03 ENCOUNTER — Encounter: Payer: Self-pay | Admitting: Emergency Medicine

## 2023-07-03 DIAGNOSIS — S52502D Unspecified fracture of the lower end of left radius, subsequent encounter for closed fracture with routine healing: Secondary | ICD-10-CM | POA: Insufficient documentation

## 2023-07-03 DIAGNOSIS — Z96642 Presence of left artificial hip joint: Secondary | ICD-10-CM | POA: Insufficient documentation

## 2023-07-03 DIAGNOSIS — J45909 Unspecified asthma, uncomplicated: Secondary | ICD-10-CM | POA: Diagnosis not present

## 2023-07-03 DIAGNOSIS — E109 Type 1 diabetes mellitus without complications: Secondary | ICD-10-CM | POA: Diagnosis not present

## 2023-07-03 DIAGNOSIS — W19XXXD Unspecified fall, subsequent encounter: Secondary | ICD-10-CM | POA: Insufficient documentation

## 2023-07-03 DIAGNOSIS — Z9641 Presence of insulin pump (external) (internal): Secondary | ICD-10-CM | POA: Diagnosis not present

## 2023-07-03 DIAGNOSIS — S6992XD Unspecified injury of left wrist, hand and finger(s), subsequent encounter: Secondary | ICD-10-CM | POA: Diagnosis present

## 2023-07-03 MED ORDER — DOCUSATE SODIUM 100 MG PO CAPS: 100.0000 mg | ORAL_CAPSULE | Freq: Two times a day (BID) | ORAL | 0 refills | Status: AC

## 2023-07-03 MED ORDER — ONDANSETRON 4 MG PO TBDP
4.0000 mg | ORAL_TABLET | Freq: Once | ORAL | Status: AC
  Administered 2023-07-03: 4 mg via ORAL
  Filled 2023-07-03: qty 1

## 2023-07-03 MED ORDER — OXYCODONE HCL 5 MG PO TABS: 10.0000 mg | ORAL_TABLET | Freq: Three times a day (TID) | ORAL | 0 refills | Status: DC | PRN

## 2023-07-03 MED ORDER — MORPHINE SULFATE (PF) 4 MG/ML IV SOLN
4.0000 mg | Freq: Once | INTRAVENOUS | Status: AC
  Administered 2023-07-03: 4 mg via INTRAMUSCULAR
  Filled 2023-07-03: qty 1

## 2023-07-03 MED ORDER — ACETAMINOPHEN 500 MG PO TABS: 1000.0000 mg | ORAL_TABLET | Freq: Four times a day (QID) | ORAL | 0 refills | Status: DC | PRN

## 2023-07-03 NOTE — Discharge Instructions (Addendum)
 Please follow-up with Dr. Kathlynn as scheduled.   You are being provided a prescription for opiates (also known as narcotics) for pain control.  Opiates can be addictive and should only be used when absolutely necessary for pain control when other alternatives do not work.  We recommend you only use them for the recommended amount of time and only as prescribed.  Please do not take with other sedative medications or alcohol .  Please do not drive, operate machinery, make important decisions while taking opiates.  Please note that these medications can be addictive and have high abuse potential.  Patients can become addicted to narcotics after only taking them for a few days.  Please keep these medications locked away from children, teenagers or any family members with history of substance abuse.  Narcotic pain medicine may also make you constipated.  You may use over-the-counter medications such as MiraLAX , Colace to prevent constipation.  If you become constipated, you may use over-the-counter enemas as needed.  Itching and nausea are also common side effects of narcotic pain medication.  If you develop uncontrolled vomiting or a rash, please stop these medications and seek medical care.

## 2023-07-03 NOTE — ED Triage Notes (Signed)
 Pt reports left wrist pain and swelling. Here on 07/01/23 w/ + fracture. Pt states her pain and swelling is worse. Ortho appointment next week.

## 2023-07-03 NOTE — ED Provider Notes (Signed)
 [facility] Provider Note    Event Date/Time   First MD Initiated Contact with Patient 07/03/23 [phone]     (approximate)   History   Wrist Pain   HPI  Suzanne Snyder is a 73 y.o. female right-hand-dominant with history of type 1 diabetes, chronic pain who presents to the emergency department with increasing left wrist pain.  Patient had a mechanical fall and has a left distal radius fracture and is in a Velcro splint.  She reports she has taken the Velcro splint off once because of pain and swelling in her hand but states she has been compliant wearing it otherwise.  She states she is taking 1 oxycodone  5 mg tablet every 6 hours without much relief.  She is almost out of this medication.  She has follow-up with Dr. Kathlynn with orthopedic surgery scheduled this week.  She denies any new injury.   History provided by patient, daughter.    Past Medical History:  Diagnosis Date   Anxiety    Arthritis    Asthma    Diabetes mellitus without complication (HCC)    Insulin  pump in place    Osteoporosis    RLS (restless legs syndrome)    Smokers' cough (HCC)    Wears dentures    full upper and lower    Past Surgical History:  Procedure Laterality Date   CARPAL TUNNEL RELEASE Left 11/20/2022   Procedure: CARPAL TUNNEL RELEASE;  Surgeon: Lorelle Hussar, MD;  Location: [facility] ORS;  Service: Orthopedics;  Laterality: Left;   CATARACT EXTRACTION W/PHACO Right 02/20/2020   Procedure: CATARACT EXTRACTION PHACO AND INTRAOCULAR LENS PLACEMENT (IOC) RIGHT 5.60 00:36.3;  Surgeon: Jaye Fallow, MD;  Location: [facility];  Service: Ophthalmology;  Laterality: Right;  Diabetic - insulin  pump Requests not first   CATARACT EXTRACTION W/PHACO Left 03/05/2020   Procedure: CATARACT EXTRACTION PHACO AND INTRAOCULAR LENS PLACEMENT (IOC) LEFT 4.94 00:31.6;  Surgeon: Jaye Fallow, MD;  Location: [facility];  Service: Ophthalmology;  Laterality: Left;   HIP  PINNING,CANNULATED Right 02/09/2022   Procedure: PERCUTANEOUS FIXATION OF FEMORAL NECK;  Surgeon: Cleotilde Barrio, MD;  Location: [facility] OR[facility];  Service: Orthopedics;  Laterality: Right;   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   ROTATOR CUFF REPAIR Left 2014 and 2015   SPINE SURGERY N/A 1984   L4-5, not sure of procedure   THYROID SURGERY      MEDICATIONS:  Prior to Admission medications   Medication Sig Start Date End Date Taking? Authorizing Provider  acetaminophen  (TYLENOL ) 325 MG tablet Take 2 tablets (650 mg total) by mouth every 6 (six) hours as needed for mild pain, moderate pain, fever or headache (or Fever >/= 101). 02/17/22   Von Bellis, MD  albuterol  (PROVENTIL ) (2.5 MG/3ML) 0.083% nebulizer solution Take 2.5 mg by nebulization every 4 (four) hours as needed for wheezing or shortness of breath.    [provider]  albuterol  (VENTOLIN  HFA) 108 (90 Base) MCG/ACT inhaler Inhale 1-2 puffs into the lungs every 6 (six) hours as needed for wheezing or shortness of breath. 12/01/22   Hongalgi, Anand D, MD  apixaban  (ELIQUIS ) 5 MG TABS tablet Take 1 tablet (5 mg total) by mouth 2 (two) times daily. Patient not taking: Reported on 11/29/2022 05/15/22   Dorinda Drue DASEN, MD  clobetasol  (TEMOVATE ) 0.05 % external solution Apply topically to itchy areas of scalp twice daily as needed for flares. Avoid applying to face, groin, and axilla. Use as  directed. Patient not taking: Reported on 04/20/2023 10/21/22   Claudene Lehmann, MD  clonazePAM  (KLONOPIN ) 0.5 MG tablet Take 1 tablet (0.5 mg total) by mouth 2 (two) times daily as needed for anxiety. 02/17/22   Von Bellis, MD  cyclobenzaprine  (FLEXERIL ) 10 MG tablet Take 10 mg by mouth 3 (three) times daily as needed for muscle spasms.    [provider]  denosumab (PROLIA) 60 MG/ML SOSY injection Inject 60 mg into the skin every 6 (six) months.    [provider]  dupilumab  (DUPIXENT ) 300 MG/2ML prefilled syringe Inject 300 mg  into the skin every 14 (fourteen) days. 04/12/23   Smith, Collin-Jamal, MD  furosemide (LASIX) 20 MG tablet Take 20 mg by mouth daily as needed for edema or fluid. Patient not taking: Reported on 04/20/2023 12/31/21   [provider]  gabapentin  (NEURONTIN ) 300 MG capsule Take 900 mg by mouth 2 (two) times daily.    [provider]  hydrocortisone  2.5 % cream Apply to itchy rash around eyes twice daily until clear. 10/21/22   Claudene Lehmann, MD  hydrOXYzine  (ATARAX ) 25 MG tablet Take 50 mg by mouth 2 (two) times daily. 03/17/22   [provider]  insulin  aspart (NOVOLOG ) 100 UNIT/ML injection Inject 40 Units into the skin daily. AS INSTRUCTED VIA OMNIPOD INSULIN  PUMP 10/13/22   [provider]  Insulin  Disposable Pump (OMNIPOD 5 [id number] PODS GEN 5) MISC Inject 1 Pump into the skin every 3 (three) days. 06/25/22   [provider]  Insulin  Disposable Pump (OMNIPOD DASH PODS, GEN 4,) MISC Inject 1 Pump into the skin every 3 (three) days.    [provider]  ipratropium (ATROVENT ) 0.02 % nebulizer solution Take 0.5 mg by nebulization every 6 (six) hours as needed for wheezing or shortness of breath.    [provider]  iron  polysaccharides (NIFEREX) 150 MG capsule Take 1 capsule (150 mg total) by mouth daily. 02/17/22 11/29/22  Von Bellis, MD  Multiple Vitamin (MULTIVITAMIN ADULT PO) Take by mouth 2 (two) times daily.    [provider]  nicotine  (NICODERM CQ  - DOSED IN MG/24 HOURS) 21 mg/24hr patch Place 1 patch (21 mg total) onto the skin daily. 12/02/22   Hongalgi, Anand D, MD  Omega-3 Fatty Acids (FISH OIL) 1000 MG CAPS Take 2 capsules by mouth daily. 08/04/19   [provider]  oxyCODONE  (OXY IR/ROXICODONE ) 5 MG immediate release tablet Take 1 tablet (5 mg total) by mouth every 6 (six) hours as needed. 07/01/23   Saunders Shona CROME, PA-C  pantoprazole  (PROTONIX ) 40 MG tablet Take 1 tablet (40 mg total) by mouth daily. 05/06/22  11/29/22  Cyrena Mylar, MD  polyethylene glycol (MIRALAX ) 17 g packet Take 17 g by mouth daily. 02/11/22   Wouk, Devaughn Sayres, MD  polyvinyl alcohol  (LIQUIFILM TEARS) 1.4 % ophthalmic solution Place 1 drop into both eyes as needed for dry eyes. 02/17/22   Von Bellis, MD  rOPINIRole  (REQUIP ) 1 MG tablet Take 1.5 mg by mouth at bedtime.    [provider]  sodium bicarbonate  650 MG tablet Take 1 tablet (650 mg total) by mouth 3 (three) times daily. 12/01/22   Hongalgi, Anand D, MD  terbinafine  (LAMISIL ) 1 % cream Apply 1 Application topically daily as needed (feet). Apply topically to feet twice daily for tinea pedis 12/01/22   Hongalgi, Anand D, MD  traMADol  (ULTRAM ) 50 MG tablet Take 1 tablet (50 mg total) by mouth every 6 (six) hours as needed for  severe pain (pain score 7-10). 04/20/23 07/19/23  Marcelino Nurse, MD  Vitamin D , Ergocalciferol , (DRISDOL ) 1.25 MG (50000 UNIT) CAPS capsule Take 3 capsules (150,000 Units total) by mouth 3 (three) times a week. Please verify dose with your prescribing provider before taking it. 12/02/22   Judeth Trenda BIRCH, MD    Physical Exam   Triage Vital Signs: ED Triage Vitals  Encounter Vitals Group     BP 07/03/23 0320 (!) 153/58     Girls Systolic BP Percentile --      Girls Diastolic BP Percentile --      Boys Systolic BP Percentile --      Boys Diastolic BP Percentile --      Pulse Rate 07/03/23 0320 95     Resp 07/03/23 0320 18     Temp 07/03/23 0320 99.4 F (37.4 C)     Temp Source 07/03/23 0320 Oral     SpO2 07/03/23 0320 96 %     Weight --      Height --      Head Circumference --      Peak Flow --      Pain Score 07/03/23 0321 9     Pain Loc --      Pain Education --      Exclude from Growth Chart --     Most recent vital signs: Vitals:   07/03/23 0320 07/03/23 0628  BP: (!) 153/58 (!) 144/59  Pulse: 95 90  Resp: 18 16  Temp: 99.4 F (37.4 C) 98 F (36.7 C)  SpO2: 96% 97%     CONSTITUTIONAL: Alert and responds  appropriately to questions. Well-appearing; well-nourished HEAD: Normocephalic, atraumatic EYES: Conjunctivae clear, pupils appear equal ENT: normal nose; moist mucous membranes NECK: Normal range of motion CARD: Regular rate and rhythm RESP: Normal chest excursion without splinting or tachypnea; no hypoxia or respiratory distress, speaking full sentences ABD/GI: non-distended EXT: Tender to palpation over the left wrist with soft tissue swelling to the wrist, hand.  2+ left radial pulse.  Compartments soft.  Normal capillary refill.  Normal range of motion in the fingers, elbow on the left side. SKIN: Normal color for age and race, no rashes on exposed skin NEURO: Moves all extremities equally, normal speech, no facial asymmetry noted PSYCH: The patient's mood and manner are appropriate. Grooming and personal hygiene are appropriate.  ED Results / Procedures / Treatments   LABS: (all labs ordered are listed, but only abnormal results are displayed) Labs Reviewed - No data to display   EKG:  EKG Interpretation Date/Time:    Ventricular Rate:    PR Interval:    QRS Duration:    QT Interval:    QTC Calculation:   R Axis:      Text Interpretation:            RADIOLOGY: My personal review and interpretation of imaging: X-ray shows increased impaction.  I have personally reviewed all radiology reports. No results found.    PROCEDURES:  Critical Care performed: No    Procedures  SPLINT APPLICATION  Authorized by: Josette Raelle Chambers Consent: Verbal consent obtained. Risks and benefits: risks, benefits and alternatives were discussed Consent given by: patient Splint applied by: medic Location details: Left arm Splint type: Sugar-tong, sling Supplies used: Ortho-Glass, Ace wrap, sling Post-procedure: The splinted body part was neurovascularly unchanged following the procedure. Patient tolerance: Patient tolerated the procedure well with no immediate  complications.    IMPRESSION / MDM / ASSESSMENT AND PLAN /  ED COURSE  I reviewed the triage vital signs and the nursing notes.   Patient here with increasing left wrist pain.  No new injury.   DIFFERENTIAL DIAGNOSIS (includes but not limited to):   Worsening fracture, dislocation, uncontrolled pain due to soft tissue swelling, no sign of compartment syndrome, cellulitis, septic arthritis, gout  Patient's presentation is most consistent with acute complicated illness / injury requiring diagnostic workup.  PLAN: Repeat x-ray reviewed and interpreted by myself and the radiologist and shows worsening impaction of the known distal radius fracture without dislocation.  Neurovascular intact distally.  She is currently in a thumb spica Velcro splint.  I wonder if this is just not giving her enough support.  Will give IM morphine  and place in a sugar-tong splint.  She states she has orthopedics follow-up already scheduled for next week.   MEDICATIONS GIVEN IN ED: Medications  morphine  (PF) 4 MG/ML injection 4 mg (4 mg Intramuscular Given 07/03/23 0518)  ondansetron  (ZOFRAN -ODT) disintegrating tablet 4 mg (4 mg Oral Given 07/03/23 0518)     ED COURSE: Patient reports pain significantly improved after IM morphine  and once in a sugar-tong splint and sling.  She has orthopedic follow-up scheduled.  Have provided with a refill of her pain medication.  Discussed supportive care instructions including keeping her arm elevated when at rest to help with swelling and discomfort.  At this time, I do not feel there is any life-threatening condition present. I reviewed all nursing notes, vitals, pertinent previous records.  All lab and urine results, EKGs, imaging ordered have been independently reviewed and interpreted by myself.  I reviewed all available radiology reports from any imaging ordered this visit.  Based on my assessment, I feel the patient is safe to be discharged home without further emergent  workup and can continue workup as an outpatient as needed. Discussed all findings, treatment plan as well as usual and customary return precautions.  They verbalize understanding and are comfortable with this plan.  Outpatient follow-up has been provided as needed.  All questions have been answered.    CONSULTS:  none   OUTSIDE RECORDS REVIEWED: Reviewed last internal medicine note on 06/29/2023.     FINAL CLINICAL IMPRESSION(S) / ED DIAGNOSES   Final diagnoses:  Closed fracture of distal end of left radius with routine healing, unspecified fracture morphology, subsequent encounter     Rx / DC Orders   ED Discharge Orders          Ordered    oxyCODONE  (ROXICODONE ) 5 MG immediate release tablet  Every 8 hours PRN        07/03/23 0534    acetaminophen  (TYLENOL ) 500 MG tablet  Every 6 hours PRN        07/03/23 0534    docusate sodium  (COLACE) 100 MG capsule  2 times daily        07/03/23 0534             Note:  This document was prepared using Dragon voice recognition software and may include unintentional dictation errors.   Jennea Rager, Josette SAILOR, DO 07/04/23 [phone]

## 2023-07-13 ENCOUNTER — Ambulatory Visit: Admitting: Physical Therapy

## 2023-07-14 ENCOUNTER — Ambulatory Visit (HOSPITAL_BASED_OUTPATIENT_CLINIC_OR_DEPARTMENT_OTHER): Admitting: Nurse Practitioner

## 2023-07-14 DIAGNOSIS — Z91199 Patient's noncompliance with other medical treatment and regimen due to unspecified reason: Secondary | ICD-10-CM | POA: Insufficient documentation

## 2023-07-14 DIAGNOSIS — G894 Chronic pain syndrome: Secondary | ICD-10-CM

## 2023-07-14 NOTE — Progress Notes (Signed)
 07/14/2023-No Show

## 2023-08-05 NOTE — Progress Notes (Signed)
 Chief complaint: Diabetes  History of present illness: Suzanne Snyder is 73 y.o. female seen in follow up. She was last seen on 06/02/2023.  Type 1 diabetes --  Her last Hb A1c was 6.8% in 05/2023.  She has an OmniPod Dash insulin  pump (code [phone]) and a Contour Next One glucometer. Unfortunately we could not download either device today as software was not functioning. I manually reviewed both devices. Pump settings: Basal rates 12 am 0.5 units/hr 6am 0.75 units/hr 8 am 0.4 units/hr 6 pm 0.45 units/hr  24 hr basal = 11.2 units  Bolus settings Target 120 Correction 130 I:C ratio 24 Sensitivity 90  Her sugars ranged 60 - 260 mg/dl over last 2 weeks. Avg blood glucose is in the 140 - 170 mg/dl range each day. She is consistently entering carbohydrates and bolusing insulin  before meals. Avg carbs entered per day ranges 120 -150 g. Rare hypoglycemia.   I have encouraged her to upgrade to the OmniPod 5 and prescriptions were sent in 04/2023 and we confirmed insurance approved it. She has not yet picked u the OmniPod 5 G6/G7 pods.   Diabetes is complicated by microalbuminuria with eGFR 79. She had an eye exam on 11/09/2022.   Post-menopausal osteoporosis s/p hip fx in 2007 s/p left radius fracture on 07/01/2023 after mechanical fall. She has a wrist brace on. She has been seen by orthopedics. She complains of pain at the wrist. Her daughter stole her oxycodone  and she asks if I will send a new prescription for this medication. She has history of hyperparathyroidism s/p left superior parathyroidectomy (08/2019). She had a DEXA scan here on 01/19/2022 notable for osteoporosis.  Medications for osteoporosis: Prolia q6 months at [facility] from 2017 - approx 2022. Restarted 03/12/2022 - present. Alendronate from 2010 - 2017.   Vitamin D  Deficiency. She had a persistently low 25-OH D level in 05/2023. She takes ergocalciferol  50,000 IU two tabs (100,000) daily as prescribed. Dose last increased in 05/2023.    Tobacco use. She smokes 1 ppd cigarettes. Declines smoking cessation medications.     Past Medical History:  Diagnosis Date  . Anxiety   . Aortic atherosclerosis ()    on CT scan 05/25/2022  . Arthritis   . Asthma (HHS-HCC)   . COPD (chronic obstructive pulmonary disease) (CMS/HHS-HCC)   . Diabetes mellitus type I (CMS/HHS-HCC)   . Hepatic steatosis    on CT scan 05/25/2022  . Hyperparathyroidism (CMS/HHS-HCC)    s/p left superior parathyroidectomy 08/2019  . Osteoporosis   . RLS (restless legs syndrome)   . Small bowel obstruction (CMS/HHS-HCC) 04/2022   Hospitalization at [facility]  . Wears dentures     Outpatient Medications Marked as Taking for the 08/05/23 encounter (Office Visit) with Solum, Anna Melissa, MD  Medication Sig Dispense Refill  . acyclovir (ZOVIRAX) 400 MG tablet Take 400 mg by mouth as needed    . albuterol  (PROVENTIL ) 2.5 mg /3 mL (0.083 %) nebulizer solution Take 3 mLs (2.5 mg total) by nebulization every 4 (four) hours as needed for Wheezing 75 mL 5  . albuterol  MDI, PROVENTIL , VENTOLIN , PROAIR , HFA 90 mcg/actuation inhaler USE 2 INHALATIONS EVERY 4 HOURS AS NEEDED OR COUGHING OR WHEEZING 17 g 10  . citalopram (CELEXA) 10 MG tablet Take 1 tablet (10 mg total) by mouth once daily 90 tablet 3  . clobetasoL  (CORMAX ) 0.05 % external solution Apply topically 2 (two) times daily    . clonazePAM  (KLONOPIN ) 0.5 MG tablet TAKE 1 TABLET(0.5 MG) BY  MOUTH TWICE DAILY AS NEEDED FOR ANXIETY 90 tablet 4  . cyclobenzaprine  (FLEXERIL ) 10 MG tablet TAKE 1 TABLET THREE TIMES A DAY AS NEEDED FOR MUSCLE SPASMS (NEEDS APPOINTMENT FOR FURTHER REFILLS) 90 tablet 11  . denosumab (PROLIA) 60 mg/mL inj syringe Inject 1 mL (60 mg total) subcutaneously once for 1 dose 1 mL 0  . ergocalciferol , vitamin D2, 1,250 mcg (50,000 unit) capsule Take TWO tablets daily. 180 capsule 3  . fluticasone  propion-salmeteroL (WIXELA INHUB) 500-50 mcg/dose diskus inhaler USE 1 INHALATION TWICE A DAY 60 each 6   . gabapentin  (NEURONTIN ) 300 MG capsule Take 3 capsules (900 mg total) by mouth 2 (two) times daily 120 capsule 5  . hydrOXYzine  (ATARAX ) 25 MG tablet TAKE 2 TABLETS TWICE A DAY 360 tablet 1  . insulin  ASPART (NOVOLOG  U-100 INSULIN  ASPART) injection (concentration 100 units/mL) INJECT 88 UNITS DAILY AS INSTRUCTED VIA OMNIPOD INSULIN  PUMP 80 mL 1  . multivitamin tablet Take 1 tablet by mouth once daily    . omega-3 acid ethyl esters (LOVAZA ) 1 gram capsule Take 2 g by mouth once daily    . promethazine (PHENERGAN) 25 MG suppository Place 25 mg rectally as needed for Nausea or Vomiting    . rOPINIRole  (REQUIP ) 2 MG immediate release tablet Take 1 tablet (2 mg total) by mouth at bedtime 90 tablet 3  . terBINafine  HCL (LAMISIL  AT) 1 % cream Apply 1 Application topically once daily      Exam: BP 128/60   Pulse 91   Ht 172.7 cm (5' 8)   Wt 50 kg (110 lb 3.2 oz) Comment: per patient  SpO2 97%   BMI 16.76 kg/m   GEN: underweight female in NAD.    PSYC: alert and oriented, good insight. SKIN: Declines a foot exam today    Assessment / Pan: 1. Type 1 diabetes mellitus on insulin  therapy (CMS-HCC)  *Her diabetes is reasonably well controlled.  *Continue use of insulin  pump. She would benefit from a closed loop pump which is the OmniPod 5 insulin  pump and the DexCom CGM. She declines this option again today. *Suggested she try a DexCom G7 CGM today. Assisted her to get the app on her smart phone. Provided three G7 samples. Assisted to place a sample sensor on her arm, pair it with the app and get device calibrated and ready for use. I am hopeful she will continue use of the CGM as it is preferable to fingerstick blood glucose monitoring given alerts for lows and highs. *No changes made to pump settings today. *She requested a one month fill of Dash pods as she is close to running out. New prescription sent. *Continue annual eye exams.   2. Osteoporosis, post-menopausal and recent radius  fracture *She has osteoporosis and recent radius fracture plus history of hip fracture. Risk fractures for osteoporosis include prior fractures, post-menopausal status, age, low BMI, and tobacco use. She is at high risk of future fracture.  *Will consider adjusting medical therapy. Options include 12 mths of Evenity vs 24 months PTH analog. Provided information about Forteo today for her to review as this would be my preferred option given the warning on Evenity for Potential risk of myocardial infarction, stroke and cardiovascular death as she has many risk factors for vascular disease. Will repeat PTH level with next labs.  *Consider repeat DEXA in 01/2024 *Avoid falls.   3. Vitamin D  deficiency *Continue ergocalciferol  100,000 IU (two tabs) daily.  *Repeat 25-OH D level with next labs.  4.  History of secondary hyperparathyroidism, likely due to vitamin D  insufficiency.  *Will continue to monitor.   5. Tobacco dependence *Counseled her to quit smoking.    Anticipate follow up with me in 12 weeks.   I spent a total of 53 minutes in both face-to-face and non-face-to-face activities, excluding procedures performed, for this visit on the date of this encounter.

## 2023-08-10 ENCOUNTER — Other Ambulatory Visit: Payer: Self-pay

## 2023-08-10 ENCOUNTER — Emergency Department
Admission: EM | Admit: 2023-08-10 | Discharge: 2023-08-10 | Disposition: A | Discharge status: Home / Self Care | Attending: Emergency Medicine | Admitting: Emergency Medicine

## 2023-08-10 ENCOUNTER — Emergency Department

## 2023-08-10 DIAGNOSIS — E162 Hypoglycemia, unspecified: Secondary | ICD-10-CM | POA: Diagnosis present

## 2023-08-10 DIAGNOSIS — E10649 Type 1 diabetes mellitus with hypoglycemia without coma: Secondary | ICD-10-CM | POA: Diagnosis not present

## 2023-08-10 DIAGNOSIS — Z9641 Presence of insulin pump (external) (internal): Secondary | ICD-10-CM | POA: Diagnosis not present

## 2023-08-10 LAB — COMPREHENSIVE METABOLIC PANEL WITH GFR
ALT: 15 U/L (ref 0–44)
AST: 22 U/L (ref 15–41)
Albumin: 3.3 g/dL — ABNORMAL LOW (ref 3.5–5.0)
Alkaline Phosphatase: 74 U/L (ref 38–126)
Anion gap: 10 (ref 5–15)
BUN: 19 mg/dL (ref 8–23)
CO2: 22 mmol/L (ref 22–32)
Calcium: 8.6 mg/dL — ABNORMAL LOW (ref 8.9–10.3)
Chloride: 99 mmol/L (ref 98–111)
Creatinine, Ser: 0.63 mg/dL (ref 0.44–1.00)
GFR, Estimated: >60 mL/min (ref >60)
Glucose, Bld: 169 mg/dL — ABNORMAL HIGH (ref 70–99)
Potassium: 4.1 mmol/L (ref 3.5–5.1)
Sodium: 131 mmol/L — ABNORMAL LOW (ref 135–145)
Total Bilirubin: 0.3 mg/dL (ref 0.0–1.2)
Total Protein: 5.9 g/dL — ABNORMAL LOW (ref 6.5–8.1)

## 2023-08-10 LAB — CBC WITH DIFFERENTIAL/PLATELET
Abs Immature Granulocytes: 0.08 K/uL — ABNORMAL HIGH (ref 0.00–0.07)
Basophils Absolute: 0.1 K/uL (ref 0.0–0.1)
Basophils Relative: 1 %
Eosinophils Absolute: 0.1 K/uL (ref 0.0–0.5)
Eosinophils Relative: 1 %
HCT: 40.7 % (ref 36.0–46.0)
Hemoglobin: 13.2 g/dL (ref 12.0–15.0)
Immature Granulocytes: 1 %
Lymphocytes Relative: 11 %
Lymphs Abs: 1.7 K/uL (ref 0.7–4.0)
MCH: 30.9 pg (ref 26.0–34.0)
MCHC: 32.4 g/dL (ref 30.0–36.0)
MCV: 95.3 fL (ref 80.0–100.0)
Monocytes Absolute: 0.8 K/uL (ref 0.1–1.0)
Monocytes Relative: 6 %
Neutro Abs: 12 K/uL — ABNORMAL HIGH (ref 1.7–7.7)
Neutrophils Relative %: 80 %
Platelets: 349 K/uL (ref 150–400)
RBC: 4.27 MIL/uL (ref 3.87–5.11)
RDW: 15.8 % — ABNORMAL HIGH (ref 11.5–15.5)
WBC: 14.8 K/uL — ABNORMAL HIGH (ref 4.0–10.5)
nRBC: 0 % (ref 0.0–0.2)

## 2023-08-10 LAB — URINALYSIS, ROUTINE W REFLEX MICROSCOPIC
Bilirubin Urine: NEGATIVE
Glucose, UA: 150 mg/dL — AB
Hgb urine dipstick: NEGATIVE
Ketones, ur: NEGATIVE mg/dL
Leukocytes,Ua: NEGATIVE
Nitrite: NEGATIVE
Protein, ur: 30 mg/dL — AB
Specific Gravity, Urine: 1.021 (ref 1.005–1.030)
pH: 5 (ref 5.0–8.0)

## 2023-08-10 LAB — BLOOD GAS, VENOUS
Acid-base deficit: 1.8 mmol/L (ref 0.0–2.0)
Bicarbonate: 24.7 mmol/L (ref 20.0–28.0)
O2 Saturation: 48.8 %
Patient temperature: 37
pCO2, Ven: 48 mmHg (ref 44–60)
pH, Ven: 7.32 (ref 7.25–7.43)

## 2023-08-10 LAB — CBG MONITORING, ED
Glucose-Capillary: 15 mg/dL — CL (ref 70–99)
Glucose-Capillary: 229 mg/dL — ABNORMAL HIGH (ref 70–99)
Glucose-Capillary: 244 mg/dL — ABNORMAL HIGH (ref 70–99)
Glucose-Capillary: 247 mg/dL — ABNORMAL HIGH (ref 70–99)
Glucose-Capillary: 265 mg/dL — ABNORMAL HIGH (ref 70–99)

## 2023-08-10 LAB — BETA-HYDROXYBUTYRIC ACID: Beta-Hydroxybutyric Acid: 0.16 mmol/L (ref 0.05–0.27)

## 2023-08-10 LAB — HEMOGLOBIN A1C
Hgb A1c MFr Bld: 6.4 % — ABNORMAL HIGH (ref 4.8–5.6)
Mean Plasma Glucose: 136.98 mg/dL

## 2023-08-10 MED ORDER — INSULIN GLARGINE-YFGN 100 UNIT/ML ~~LOC~~ SOLN
8.0000 [IU] | Freq: Once | SUBCUTANEOUS | Status: AC
  Administered 2023-08-10: 8 [IU] via SUBCUTANEOUS
  Filled 2023-08-10: qty 0.08

## 2023-08-10 MED ORDER — INSULIN GLARGINE 100 UNIT/ML SOLOSTAR PEN: 12.0000 [IU] | PEN_INJECTOR | Freq: Every day | SUBCUTANEOUS | 0 refills | Status: DC

## 2023-08-10 MED ORDER — DEXTROSE 10 % IV SOLN: INTRAVENOUS | Status: DC

## 2023-08-10 MED ORDER — INSULIN STARTER KIT- PEN NEEDLES (ENGLISH)
1.0000 | Freq: Once | Status: AC
  Administered 2023-08-10: 1
  Filled 2023-08-10: qty 1

## 2023-08-10 MED ORDER — INSULIN ASPART 100 UNIT/ML IJ SOLN: 0.0000 [IU] | Freq: Three times a day (TID) | INTRAMUSCULAR | Status: DC

## 2023-08-10 MED ORDER — INSULIN ASPART 100 UNIT/ML IJ SOLN
0.0000 [IU] | Freq: Three times a day (TID) | INTRAMUSCULAR | Status: DC
  Administered 2023-08-10: 2 [IU] via SUBCUTANEOUS
  Filled 2023-08-10: qty 1

## 2023-08-10 MED ORDER — INSULIN ASPART 100 UNIT/ML IJ SOLN: 0.0000 [IU] | Freq: Every day | INTRAMUSCULAR | Status: DC

## 2023-08-10 MED ORDER — INSULIN GLARGINE 100 UNIT/ML SOLOSTAR PEN: 12.0000 [IU] | PEN_INJECTOR | Freq: Every day | SUBCUTANEOUS | 0 refills | Status: AC

## 2023-08-10 MED ORDER — DEXTROSE 50 % IV SOLN
1.0000 | Freq: Once | INTRAVENOUS | Status: AC
  Administered 2023-08-10: 50 mL via INTRAVENOUS
  Filled 2023-08-10: qty 50

## 2023-08-10 NOTE — Discharge Instructions (Addendum)
 Please pick up your pods as scheduled by the insulin  coordinator and follow the diabetes nurse instruction process.  Please follow-up with your endocrinologist as scheduled.  Return if any acutely worsening issues or any other emergency -- RETURN PRECAUTIONS & AFTERCARE: (ENGLISH) RETURN PRECAUTIONS: Return immediately to the emergency department or see/call your doctor if you feel worse, weak or have changes in speech or vision, are short of breath, have fever, vomiting, pain, bleeding or dark stool, trouble urinating or any new issues. Return here or see/call your doctor if not improving as expected for your suspected condition. FOLLOW-UP CARE: Call your doctor and/or any doctors we referred you to for more advice and to make an appointment. Do this today, tomorrow or after the weekend. Some doctors only take PPO insurance so if you have HMO insurance you may want to contact your HMO or your regular doctor for referral to a specialist within your plan. Either way tell the doctor's office that it was a referral from the emergency department so you get the soonest possible appointment.  YOUR TEST RESULTS: Take result reports of any blood or urine tests, imaging tests and EKG's to your doctor and any referral doctor. Have any abnormal tests repeated. Your doctor or a referral doctor can let you know when this should be done. Also make sure your doctor contacts this hospital to get any test results that are not currently available such as cultures or special tests for infection and final imaging reports, which are often not available at the time you leave the ER but which may list additional important findings that are not documented on the preliminary report. BLOOD PRESSURE: If your blood pressure was greater than 120/80 have your blood pressure rechecked within 1 to 2 weeks. MEDICATION SIDE EFFECTS: Do not drive, walk, bike, take the bus, etc. if you have received or are being prescribed any sedating  medications such as those for pain or anxiety or certain antihistamines like Benadryl . If you have been give one of these here get a taxi home or have a friend drive you home. Ask your pharmacist to counsel you on potential side effects of any new medication

## 2023-08-10 NOTE — ED Notes (Signed)
 IV team at bedside

## 2023-08-10 NOTE — ED Notes (Signed)
 Provided 2 servongs OJ

## 2023-08-10 NOTE — ED Provider Notes (Signed)
 [facility] Provider Note    Event Date/Time   First MD Initiated Contact with Patient 08/10/23 [phone]     (approximate)   History   Hypoglycemia   HPI Suzanne Snyder is a 73 y.o. female who presents for evaluation of low blood sugar.  The exact circumstances were unclear but apparently she was confused and combative and EMS was called at home and found that her blood sugar was 39.  She typically has an insulin  pump given a history of type 1 diabetes but apparently she has not had the pods for the insulin  recently.  EMS provided 1 mg of IM glucagon which calm the patient down and she was able to speak and explain the situation.  They were unable to obtain IV access and by the time she arrived at the emergency department her fingerstick blood glucose was 15 although she remained alert and oriented.  See ED course for additional management details.  The patient reports that because she has not had the pods of insulin  that she is used to, she has been making her best guess and drawing up the insulin  into a syringe and injecting it since she cannot use her regular monitor.  She thinks that she did not give herself any more than 2.5 units.  She says she feels otherwise well.  No recent dysuria, no chest pain, abdominal pain, shortness of breath, nausea, nor vomiting.     Physical Exam   ED Triage Vitals  Encounter Vitals Group     BP 08/10/23 0515 137/60     Girls Systolic BP Percentile --      Girls Diastolic BP Percentile --      Boys Systolic BP Percentile --      Boys Diastolic BP Percentile --      Pulse Rate 08/10/23 0505 78     Resp 08/10/23 0515 18     Temp 08/10/23 0505 (!) 97.4 F (36.3 C)     Temp Source 08/10/23 0505 Axillary     SpO2 08/10/23 0505 98 %     Weight 08/10/23 0507 49.9 kg (110 lb)     Height 08/10/23 0507 1.727 m (5' 8)     Head Circumference --      Peak Flow --      Pain Score 08/10/23 0506 7     Pain Loc --      Pain  Education --      Exclude from Growth Chart --      Most recent vital signs: Vitals:   08/10/23 0600 08/10/23 0715  BP: (!) 116/54 (!) 127/58  Pulse: 75 63  Resp: 19 15  Temp:    SpO2: 97% 98%    General: Awake, alert, conversant with me when I evaluated her. CV:  Good peripheral perfusion.  Regular rate and rhythm. Resp:  Normal effort. Speaking easily and comfortably, no accessory muscle usage nor intercostal retractions.  Normal heart sounds. Abd:  No distention.  No tenderness to palpation Other:  No appreciable focal neurological deficits and the patient is alert and oriented and acting appropriate with me.   ED Results / Procedures / Treatments   Labs (all labs ordered are listed, but only abnormal results are displayed) Labs Reviewed  URINALYSIS, ROUTINE W REFLEX MICROSCOPIC - Abnormal; Notable for the following components:      Result Value   Color, Urine YELLOW (*)    APPearance HAZY (*)    Glucose, UA 150 (*)  Protein, ur 30 (*)    Bacteria, UA RARE (*)    All other components within normal limits  CBG MONITORING, ED - Abnormal; Notable for the following components:   Glucose-Capillary 15 (*)    All other components within normal limits  CBG MONITORING, ED - Abnormal; Notable for the following components:   Glucose-Capillary 229 (*)    All other components within normal limits  CBG MONITORING, ED - Abnormal; Notable for the following components:   Glucose-Capillary 244 (*)    All other components within normal limits  COMPREHENSIVE METABOLIC PANEL WITH GFR  CBC WITH DIFFERENTIAL/PLATELET  BLOOD GAS, VENOUS  BETA-HYDROXYBUTYRIC ACID       PROCEDURES:  Critical Care performed: Yes, see critical care procedure note(s)  .Critical Care  Performed by: Gordan Huxley, MD Authorized by: Gordan Huxley, MD   Critical care provider statement:    Critical care time (minutes):  30   Critical care time was exclusive of:  Separately billable procedures and  treating other patients   Critical care was necessary to treat or prevent imminent or life-threatening deterioration of the following conditions:  Metabolic crisis and endocrine crisis   Critical care was time spent personally by me on the following activities:  Development of treatment plan with patient or surrogate, evaluation of patient's response to treatment, examination of patient, obtaining history from patient or surrogate, ordering and performing treatments and interventions, ordering and review of laboratory studies, ordering and review of radiographic studies, pulse oximetry, re-evaluation of patient's condition and review of old charts     IMPRESSION / MDM / ASSESSMENT AND PLAN / ED COURSE  I reviewed the triage vital signs and the nursing notes.                              Differential diagnosis includes, but is not limited to, accidental insulin  overdose, acute infection, electrolyte or metabolic abnormality.  Patient's presentation is most consistent with acute presentation with potential threat to life or bodily function.  Labs/studies ordered: Urinalysis, CBC with differential, beta hydroxybutyric acid, VBG, CMP, CBG  Interventions/Medications given:  Medications  dextrose  10 % infusion (has no administration in time range)  dextrose  50 % solution 50 mL (50 mLs Intravenous Given 08/10/23 0509)    (Note:  hospital course my include additional interventions and/or labs/studies not listed above.)   Patient is back to baseline after getting D50 infusion and had a repeat CBG of over 200.  I think most like she accidentally injected more insulin  than she thought accounting for the profound hypoglycemia.  I ordered a D10 infusion as well as a D50 and encouraging her to eat.  We will obtain basic labs and continue to monitor to see if she requires admission.  I am transferring ED care to Dr. Nicholaus to follow-up.     Clinical Course as of 08/10/23 0820  Tue Aug 10, 2023   9349 Transfer ED care to Dr. Nicholaus. [CF]  0705 I received signout at this patient at 7 AM.  Patient presents for hyperglycemia.  Has been having difficulty with insulin  pump.  Has been unable to fill pods secondary to insurance issues.  Has been a difficult IV access overnight.  Previously on D10.  Awaiting blood work and reassessment [HD]  0808 Ketones, ur: NEGATIVE No ketones in the urine which is reassuring [HD]  0809 Glucose-Capillary(!): 244 This was completed at the time of my signout [HD]  Clinical Course User Index [CF] Gordan Huxley, MD [HD] Nicholaus Rolland BRAVO, MD     FINAL CLINICAL IMPRESSION(S) / ED DIAGNOSES   Final diagnoses:  Hypoglycemia     Rx / DC Orders   ED Discharge Orders     None        Note:  This document was prepared using Dragon voice recognition software and may include unintentional dictation errors.   Gordan Huxley, MD 08/10/23 [phone]

## 2023-08-10 NOTE — Inpatient Diabetes Management (Addendum)
 Inpatient Diabetes Program Recommendations  AACE/ADA: New Consensus Statement on Inpatient Glycemic Control (2015)  Target Ranges:  Prepandial:   less than 140 mg/dL      Peak postprandial:   less than 180 mg/dL (1-2 hours)      Critically ill patients:  140 - 180 mg/dL   Lab Results  Component Value Date   GLUCAP 247 (H) 08/10/2023   HGBA1C 7.3 (H) 11/19/2022    Latest Reference Range & Units 08/10/23 04:56 08/10/23 05:25 08/10/23 07:01 08/10/23 10:05  Glucose-Capillary 70 - 99 mg/dL 15 (LL) 770 (H) 755 (H) 247 (H)  (LL): Data is critically low (H): Data is abnormally high  Diabetes history: DM Outpatient Diabetes medications: Omnipod 5 (See below) Current orders for Inpatient glycemic control: Novolog  0-15 units tid, 0-5 units hs  Inpatient Diabetes Program Recommendations:   Please consider: -Semglee  8 units now and daily -Decrease Novolog  correction to 0-6 units tid, 0-5 units hs  Per office visit with Endocrinologist Dr. Damian on 08/05/23 Type 1 diabetes --  Her last Hb A1c was 6.8% in 05/2023.  She has an OmniPod Dash insulin  pump (code [phone]) and a Contour Next One glucometer. Unfortunately we could not download either device today as software was not functioning. I manually reviewed both devices. Pump settings: Basal rates 12 am 0.5 units/hr 6am 0.75 units/hr 8 am 0.4 units/hr 6 pm 0.45 units/hr                   24 hr basal = 11.2 units  Bolus settings Target 120 Correction 130 I:C ratio 24 Sensitivity 90  10:30 Spoke with patient @ length @ bedside. Called Dr. Brigid office and requested callback to discuss options for insulin . Dr. Brigid office staff states pt's insulin  pump pods were approved this am. Discussed with patient prior to her arrival to ED and explained her hypoglycemia post Novolog  due to very sensitive to insulin  and patient states she understands. Patient does not trust her daughter and son in law in her house or to pick up her prescriptions.  While with patient, bank called patient on her cell about fraudulent activity. Patient concerned daughter and son in law has her credit and bank cards. Patient is concerned she cannot replace her Dexcom G7 sensor when it comes due (every 10 days) due to arthritis in her hands and arms. Would patient be able to have home health replace when due or drive to Dr. Brigid office to replace sensor? Explained to patient need to have basal insulin  as back up if not able to get her insulin  pump pods so need to discuss backup plan with Dr. Damian. Will update Dr. Nicholaus after speaking with Dr. Damian or nurse.  12:43 pm Spoke with Dr. Damian via phone.  Prescription for Lantus  12 units daily if insulin  pump pod is off x 24 hrs. To be picked up on patient's way home along with the insulin  pump pods. Discussed Dexcom G7. Patient may place Dexcom G7 on her abdomen or arm as needed. Dr. Brigid office will call and speak with patient tomorrow. Spoke with Dr. Nicholaus and gave update.  1:15 pm Spoke with patient @ bedside. Patient agrees to take the Semglee  8 units and agrees to take Novolog  2 units. Patient is wearing her Dexcom G7 on her R arm. Reviewed with patient conversation with Dr. Damian and instructions. -Pick up prescription for Lantus  insulin  (to use if insulin  pump pod off x 24 hrs.) -Pick up insulin  pump pods and  restart in early am) -Dr. Brigid office to call pt. In am to check on pt.  Educated patient on insulin  pen use at home. Reviewed contents of insulin  flexpen starter kit. Reviewed all steps if insulin  pen including attachment of needle, 2-unit air shot, dialing up dose, giving injection, removing needle, disposal of sharps, storage of unused insulin , disposal of insulin  etc. Patient able to provide successful return demonstration. Also reviewed troubleshooting with insulin  pen. MD to give patient Rxs for insulin  pens and insulin  pen needles.  Reviewed treatment for hypoglycemia and to check her  phone for Dexcom readings frequently and have juice, glucose tabs, or other treatment close by if needed. Daughter called while with pt. And I asked daughter to please review discharge instructions to assist her mom as needed. Daughter agrees to review when she comes to pick up her mom.  Thank you, Orvill Coulthard E. Latina Frank, RN, MSN, CDCES  Diabetes Coordinator Inpatient Glycemic Control Team Team Pager [phone] (8am-5pm) 08/10/2023 11:32 AM

## 2023-08-10 NOTE — ED Notes (Signed)
 Pt refusing sliding scale short acting insulin  and long acting insulin  at this time. Pt states she is concerned that it was too much insulin . RN made MD aware and made diabetic coordinator aware of pt concerns.

## 2023-08-10 NOTE — ED Provider Notes (Signed)
 Clinical Course as of 08/10/23 1654  Tue Aug 10, 2023  9349 Transfer ED care to Dr. Nicholaus. [CF]  0705 I received signout at this patient at 7 AM.  Patient presents for hyperglycemia.  Has been having difficulty with insulin  pump.  Has been unable to fill pods secondary to insurance issues.  Has been a difficult IV access overnight.  Previously on D10.  Awaiting blood work and reassessment [HD]  0808 Ketones, ur: NEGATIVE No ketones in the urine which is reassuring [HD]  0809 Glucose-Capillary(!): 244 This was completed at the time of my signout [HD]  [phone] Patient reevaluated alert and oriented x 3.  She tells me that she has not been able to obtain whole OmniPod's for several months due to preauthorization issues with her insurance.  She has been using injectable insulin  and feels as if she and her daughter were missed calculating.  She denies any complaints currently.  We have established an IV.  Will DC the D10 infusion and place the patient on insulin  sliding scale.  We have reached out to the diabetes coordinator to see whether the patient can have additional resources or whether she will need to be admitted [HD]  1125 Diabetic educator at bedside.  Unfortunately patient has several social issues that may prevent her from using the OmniPod 5 appropriately.  Plan to figure out a disposition plan by 1 PM.  Diabetic nurse discussing with patient's endocrinology office [HD]  1441 Diabetes nurse has arranged for the patient to receive her necessary pods and all feel comfortable with discharge today. [HD]    Clinical Course User Index [CF] Gordan Huxley, MD [HD] Nicholaus Rolland BRAVO, MD     Upon discharge with patient's daughter at bedside, patient states that she does not have the prescription of Lantus  sent to her pharmacy that was detailed previously by the diabetes coordinator.  Multiple attempts to recontact the diabetes coordinator unsuccessful.  Consequently I have written a prescription for the  Lantus  for tonight.  Patient understands to follow-up with her endocrinologist office tomorrow.  [name] is at bedside.  All questions answered and all feel comfortable with discharge home  At time of discharge there is no evidence of acute life, limb, vision, or fertility threat. Patient has stable vital signs, pain is well controlled, patient is ambulatory and p.o. tolerant.  Discharge instructions were completed using the [employer] system. I would refer you to those at this time. All warnings prescriptions follow-up etc. were discussed in detail with the patient. Patient indicates understanding and is agreeable with this plan. All questions answered.  Patient is made aware that they may return to the emergency department for any worsening or new condition or for any other emergency.  Risk: 5 This patient has a high risk of morbidity due to further diagnostic testing or treatment. Rationale: This patient's evaluation and management involve a high risk of morbidity due to the potential severity of presenting symptoms, need for diagnostic testing, and/or initiation of treatment that may require close monitoring. The differential includes conditions with potential for significant deterioration or requiring escalation of care. Treatment decisions in the ED, including medication administration, procedural interventions, or disposition planning, reflect this level of risk. Additional Support: -- Drug therapy requiring intensive monitoring for toxicity [ ]  -- Decision regarding elective major surgery with idenitified patient or procedure risk factors [ ]  -- Decision regarding hospitalization or escalation of hospital-level care [ ]  -- Decision not to resuscitate or to de-escalate care because of  poor prognosis [ ]  -- Parental controlled substances [ ]   COPA: 5 The patient has a severe exacerbation, progression, or side effect of treatment of the following illness/illnesses: []  OR  The patient  has the following acute or chronic illness/injury that poses a possible threat to life or bodily function: [X] : The patient has a potentially serious acute condition or an acute exacerbation of a chronic illness requiring urgent evaluation and management in the Emergency Department. The clinical presentation necessitates immediate consideration of life-threatening or function-threatening diagnoses, even if they are ultimately ruled out.  Data(2/3 categories following were performed): 5 I reviewed or ordered at least three unique tests, external notes, and/or the history required an independent historian as one of the three requirements as following: CBC, BMP, multiple point-of-care glucoses, patient's daughter at bedside AND  I independently interpreted the following test: []  OR  I discussed the management of the patient with the following external physician or qualified healthcare provider: Diabetes coordinator    Suggested E/M Coding Level: 5, 99285, This has been selected based on the Aug 17, 2021 CPT guidelines for E/M codes in the Emergency Department based on 2/3 of the CoPA, Data, and Risk.    Nicholaus Rolland BRAVO, MD 08/10/23 [phone]

## 2023-08-10 NOTE — ED Triage Notes (Signed)
 Pt BIB [facility] from home. Pt was confused and combative on scene, CBG was 39. IM glucagon given PTA, CBG 15 on arrival

## 2023-08-19 ENCOUNTER — Ambulatory Visit: Admitting: Student in an Organized Health Care Education/Training Program

## 2023-09-17 ENCOUNTER — Other Ambulatory Visit: Payer: Self-pay | Admitting: Dermatology

## 2023-09-22 ENCOUNTER — Other Ambulatory Visit: Payer: Self-pay | Admitting: Dermatology

## 2023-10-13 DEATH — deceased

## 2024-05-02 ENCOUNTER — Ambulatory Visit: Admitting: Dermatology
